# Patient Record
Sex: Female | Born: 1955
Health system: Southern US, Community
[De-identification: ages and names within clinical notes are randomized; demographics above are authoritative.]

## PROBLEM LIST (undated history)

## (undated) DIAGNOSIS — R7303 Prediabetes: Secondary | ICD-10-CM

## (undated) DIAGNOSIS — E039 Hypothyroidism, unspecified: Secondary | ICD-10-CM

## (undated) DIAGNOSIS — D039 Melanoma in situ, unspecified: Secondary | ICD-10-CM

## (undated) DIAGNOSIS — K219 Gastro-esophageal reflux disease without esophagitis: Secondary | ICD-10-CM

## (undated) DIAGNOSIS — N189 Chronic kidney disease, unspecified: Secondary | ICD-10-CM

## (undated) DIAGNOSIS — J189 Pneumonia, unspecified organism: Secondary | ICD-10-CM

## (undated) DIAGNOSIS — R0600 Dyspnea, unspecified: Secondary | ICD-10-CM

## (undated) DIAGNOSIS — F319 Bipolar disorder, unspecified: Secondary | ICD-10-CM

## (undated) DIAGNOSIS — Z859 Personal history of malignant neoplasm, unspecified: Secondary | ICD-10-CM

## (undated) DIAGNOSIS — K602 Anal fissure, unspecified: Secondary | ICD-10-CM

## (undated) DIAGNOSIS — F32A Depression, unspecified: Secondary | ICD-10-CM

## (undated) DIAGNOSIS — Z85828 Personal history of other malignant neoplasm of skin: Secondary | ICD-10-CM

## (undated) DIAGNOSIS — K589 Irritable bowel syndrome without diarrhea: Secondary | ICD-10-CM

## (undated) DIAGNOSIS — E785 Hyperlipidemia, unspecified: Secondary | ICD-10-CM

## (undated) DIAGNOSIS — M20012 Mallet finger of left finger(s): Secondary | ICD-10-CM

## (undated) DIAGNOSIS — M1990 Unspecified osteoarthritis, unspecified site: Secondary | ICD-10-CM

## (undated) DIAGNOSIS — J309 Allergic rhinitis, unspecified: Secondary | ICD-10-CM

## (undated) DIAGNOSIS — T7840XA Allergy, unspecified, initial encounter: Secondary | ICD-10-CM

## (undated) DIAGNOSIS — F419 Anxiety disorder, unspecified: Secondary | ICD-10-CM

## (undated) DIAGNOSIS — Z9889 Other specified postprocedural states: Secondary | ICD-10-CM

## (undated) DIAGNOSIS — K7689 Other specified diseases of liver: Secondary | ICD-10-CM

## (undated) HISTORY — DX: Other specified diseases of liver: K76.89

## (undated) HISTORY — DX: Depression, unspecified: F32.A

## (undated) HISTORY — PX: CARDIOVASCULAR STRESS TEST: SHX262

## (undated) HISTORY — DX: Hyperlipidemia, unspecified: E78.5

## (undated) HISTORY — DX: Bipolar disorder, unspecified: F31.9

## (undated) HISTORY — DX: Allergy, unspecified, initial encounter: T78.40XA

## (undated) HISTORY — DX: Unspecified osteoarthritis, unspecified site: M19.90

## (undated) HISTORY — PX: WISDOM TOOTH EXTRACTION: SHX21

## (undated) HISTORY — PX: ABLATION: SHX5711

## (undated) HISTORY — DX: Irritable bowel syndrome, unspecified: K58.9

## (undated) HISTORY — DX: Anxiety disorder, unspecified: F41.9

## (undated) HISTORY — DX: Gastro-esophageal reflux disease without esophagitis: K21.9

## (undated) HISTORY — DX: Anal fissure, unspecified: K60.2

---
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
## 1999-04-10 HISTORY — PX: CARDIAC CATHETERIZATION: SHX172

## 2000-03-15 ENCOUNTER — Other Ambulatory Visit: Admission: RE | Admit: 2000-03-15 | Discharge: 2000-03-15 | Payer: Self-pay | Admitting: Family Medicine

## 2000-03-18 ENCOUNTER — Encounter (INDEPENDENT_AMBULATORY_CARE_PROVIDER_SITE_OTHER): Payer: Self-pay | Admitting: *Deleted

## 2000-03-18 ENCOUNTER — Ambulatory Visit (HOSPITAL_COMMUNITY): Admission: RE | Admit: 2000-03-18 | Discharge: 2000-03-19 | Payer: Self-pay | Admitting: General Surgery

## 2000-03-18 HISTORY — PX: LAPAROSCOPIC CHOLECYSTECTOMY: SUR755

## 2001-04-09 ENCOUNTER — Other Ambulatory Visit: Admission: RE | Admit: 2001-04-09 | Discharge: 2001-04-09 | Payer: Self-pay | Admitting: Family Medicine

## 2001-12-10 HISTORY — PX: CATARACT EXTRACTION W/ INTRAOCULAR LENS  IMPLANT, BILATERAL: SHX1307

## 2002-05-21 ENCOUNTER — Other Ambulatory Visit: Admission: RE | Admit: 2002-05-21 | Discharge: 2002-05-21 | Payer: Self-pay | Admitting: Family Medicine

## 2002-11-09 HISTORY — PX: COLONOSCOPY: SHX174

## 2002-11-27 LAB — HM COLONOSCOPY: HM Colonoscopy: NORMAL

## 2003-07-09 ENCOUNTER — Other Ambulatory Visit: Admission: RE | Admit: 2003-07-09 | Discharge: 2003-07-09 | Payer: Self-pay | Admitting: Family Medicine

## 2004-07-10 ENCOUNTER — Other Ambulatory Visit: Admission: RE | Admit: 2004-07-10 | Discharge: 2004-07-10 | Payer: Self-pay | Admitting: Family Medicine

## 2004-11-10 ENCOUNTER — Ambulatory Visit: Payer: Self-pay | Admitting: Family Medicine

## 2005-02-15 ENCOUNTER — Ambulatory Visit: Payer: Self-pay | Admitting: Family Medicine

## 2005-02-20 ENCOUNTER — Ambulatory Visit: Payer: Self-pay | Admitting: Family Medicine

## 2005-04-24 ENCOUNTER — Ambulatory Visit: Payer: Self-pay | Admitting: Family Medicine

## 2005-08-06 ENCOUNTER — Other Ambulatory Visit: Admission: RE | Admit: 2005-08-06 | Discharge: 2005-08-06 | Payer: Self-pay | Admitting: Family Medicine

## 2005-08-06 ENCOUNTER — Ambulatory Visit: Payer: Self-pay | Admitting: Family Medicine

## 2005-08-29 ENCOUNTER — Ambulatory Visit: Payer: Self-pay | Admitting: Family Medicine

## 2005-10-02 ENCOUNTER — Ambulatory Visit: Payer: Self-pay | Admitting: Family Medicine

## 2005-11-06 ENCOUNTER — Ambulatory Visit: Payer: Self-pay | Admitting: Family Medicine

## 2005-11-23 ENCOUNTER — Ambulatory Visit: Payer: Self-pay | Admitting: Gastroenterology

## 2006-03-25 ENCOUNTER — Ambulatory Visit: Payer: Self-pay | Admitting: Internal Medicine

## 2006-08-15 ENCOUNTER — Ambulatory Visit: Payer: Self-pay | Admitting: Family Medicine

## 2006-08-16 ENCOUNTER — Encounter: Payer: Self-pay | Admitting: Family Medicine

## 2006-08-16 LAB — CONVERTED CEMR LAB: TSH: 1.106 microintl units/mL

## 2006-09-16 ENCOUNTER — Ambulatory Visit: Payer: Self-pay | Admitting: Family Medicine

## 2006-10-25 ENCOUNTER — Ambulatory Visit: Payer: Self-pay | Admitting: Family Medicine

## 2007-01-17 ENCOUNTER — Ambulatory Visit: Payer: Self-pay | Admitting: Family Medicine

## 2007-07-07 ENCOUNTER — Ambulatory Visit: Payer: Self-pay | Admitting: Family Medicine

## 2007-07-07 DIAGNOSIS — E7800 Pure hypercholesterolemia, unspecified: Secondary | ICD-10-CM | POA: Insufficient documentation

## 2007-08-05 ENCOUNTER — Ambulatory Visit: Payer: Self-pay | Admitting: Family Medicine

## 2007-08-05 DIAGNOSIS — K219 Gastro-esophageal reflux disease without esophagitis: Secondary | ICD-10-CM | POA: Insufficient documentation

## 2007-08-05 DIAGNOSIS — J309 Allergic rhinitis, unspecified: Secondary | ICD-10-CM | POA: Insufficient documentation

## 2007-08-05 DIAGNOSIS — E039 Hypothyroidism, unspecified: Secondary | ICD-10-CM | POA: Insufficient documentation

## 2007-08-05 DIAGNOSIS — K589 Irritable bowel syndrome without diarrhea: Secondary | ICD-10-CM | POA: Insufficient documentation

## 2007-08-05 DIAGNOSIS — F319 Bipolar disorder, unspecified: Secondary | ICD-10-CM

## 2007-08-05 DIAGNOSIS — E66812 Obesity, class 2: Secondary | ICD-10-CM | POA: Insufficient documentation

## 2007-08-12 ENCOUNTER — Encounter: Payer: Self-pay | Admitting: Family Medicine

## 2007-09-17 ENCOUNTER — Ambulatory Visit: Payer: Self-pay | Admitting: Family Medicine

## 2007-09-17 ENCOUNTER — Encounter: Payer: Self-pay | Admitting: Family Medicine

## 2007-09-17 ENCOUNTER — Other Ambulatory Visit: Admission: RE | Admit: 2007-09-17 | Discharge: 2007-09-17 | Payer: Self-pay | Admitting: Family Medicine

## 2007-09-22 ENCOUNTER — Encounter (INDEPENDENT_AMBULATORY_CARE_PROVIDER_SITE_OTHER): Payer: Self-pay | Admitting: *Deleted

## 2007-09-22 LAB — CONVERTED CEMR LAB: Pap Smear: NORMAL

## 2007-10-01 ENCOUNTER — Encounter: Payer: Self-pay | Admitting: Family Medicine

## 2007-10-08 ENCOUNTER — Encounter (INDEPENDENT_AMBULATORY_CARE_PROVIDER_SITE_OTHER): Payer: Self-pay | Admitting: *Deleted

## 2008-02-02 ENCOUNTER — Ambulatory Visit: Payer: Self-pay | Admitting: Family Medicine

## 2008-04-29 ENCOUNTER — Telehealth: Payer: Self-pay | Admitting: Family Medicine

## 2008-06-22 ENCOUNTER — Encounter: Payer: Self-pay | Admitting: Family Medicine

## 2008-07-29 ENCOUNTER — Ambulatory Visit: Payer: Self-pay | Admitting: Family Medicine

## 2008-07-29 LAB — CONVERTED CEMR LAB
Bilirubin Urine: NEGATIVE
Ketones, urine, test strip: NEGATIVE
Protein, U semiquant: NEGATIVE
Urobilinogen, UA: 0.2

## 2008-07-30 ENCOUNTER — Encounter: Payer: Self-pay | Admitting: Family Medicine

## 2008-09-14 ENCOUNTER — Ambulatory Visit: Payer: Self-pay | Admitting: Family Medicine

## 2008-09-17 ENCOUNTER — Encounter: Payer: Self-pay | Admitting: Family Medicine

## 2008-09-17 ENCOUNTER — Other Ambulatory Visit: Admission: RE | Admit: 2008-09-17 | Discharge: 2008-09-17 | Payer: Self-pay | Admitting: Family Medicine

## 2008-09-17 ENCOUNTER — Ambulatory Visit: Payer: Self-pay | Admitting: Family Medicine

## 2008-09-17 DIAGNOSIS — B351 Tinea unguium: Secondary | ICD-10-CM

## 2008-10-13 ENCOUNTER — Encounter: Payer: Self-pay | Admitting: Family Medicine

## 2008-10-14 ENCOUNTER — Ambulatory Visit: Payer: Self-pay | Admitting: Family Medicine

## 2008-10-18 ENCOUNTER — Telehealth (INDEPENDENT_AMBULATORY_CARE_PROVIDER_SITE_OTHER): Payer: Self-pay | Admitting: Internal Medicine

## 2008-10-29 ENCOUNTER — Encounter (INDEPENDENT_AMBULATORY_CARE_PROVIDER_SITE_OTHER): Payer: Self-pay | Admitting: Internal Medicine

## 2008-11-02 ENCOUNTER — Telehealth (INDEPENDENT_AMBULATORY_CARE_PROVIDER_SITE_OTHER): Payer: Self-pay | Admitting: Internal Medicine

## 2008-11-09 ENCOUNTER — Ambulatory Visit: Payer: Self-pay | Admitting: Internal Medicine

## 2008-11-10 ENCOUNTER — Encounter (INDEPENDENT_AMBULATORY_CARE_PROVIDER_SITE_OTHER): Payer: Self-pay | Admitting: Internal Medicine

## 2008-12-15 ENCOUNTER — Encounter (INDEPENDENT_AMBULATORY_CARE_PROVIDER_SITE_OTHER): Payer: Self-pay | Admitting: Internal Medicine

## 2009-03-08 ENCOUNTER — Ambulatory Visit: Payer: Self-pay | Admitting: Family Medicine

## 2009-03-09 ENCOUNTER — Encounter: Payer: Self-pay | Admitting: Family Medicine

## 2009-03-10 ENCOUNTER — Ambulatory Visit: Payer: Self-pay | Admitting: Family Medicine

## 2009-03-10 DIAGNOSIS — M5137 Other intervertebral disc degeneration, lumbosacral region: Secondary | ICD-10-CM

## 2009-03-10 DIAGNOSIS — M1610 Unilateral primary osteoarthritis, unspecified hip: Secondary | ICD-10-CM | POA: Insufficient documentation

## 2009-03-18 ENCOUNTER — Telehealth: Payer: Self-pay | Admitting: Family Medicine

## 2009-05-04 ENCOUNTER — Ambulatory Visit: Payer: Self-pay | Admitting: Family Medicine

## 2009-07-05 ENCOUNTER — Telehealth: Payer: Self-pay | Admitting: Family Medicine

## 2009-07-06 ENCOUNTER — Telehealth: Payer: Self-pay | Admitting: Family Medicine

## 2009-09-07 ENCOUNTER — Ambulatory Visit: Payer: Self-pay | Admitting: Family Medicine

## 2009-09-09 ENCOUNTER — Encounter: Payer: Self-pay | Admitting: Family Medicine

## 2009-09-30 ENCOUNTER — Ambulatory Visit: Payer: Self-pay | Admitting: Family Medicine

## 2009-09-30 ENCOUNTER — Other Ambulatory Visit: Admission: RE | Admit: 2009-09-30 | Discharge: 2009-09-30 | Payer: Self-pay | Admitting: Family Medicine

## 2009-09-30 ENCOUNTER — Encounter: Payer: Self-pay | Admitting: Family Medicine

## 2009-09-30 DIAGNOSIS — Z780 Asymptomatic menopausal state: Secondary | ICD-10-CM | POA: Insufficient documentation

## 2009-10-04 ENCOUNTER — Encounter: Payer: Self-pay | Admitting: Family Medicine

## 2009-10-05 ENCOUNTER — Encounter (INDEPENDENT_AMBULATORY_CARE_PROVIDER_SITE_OTHER): Payer: Self-pay | Admitting: *Deleted

## 2009-10-13 ENCOUNTER — Telehealth: Payer: Self-pay | Admitting: Family Medicine

## 2009-10-19 ENCOUNTER — Encounter (INDEPENDENT_AMBULATORY_CARE_PROVIDER_SITE_OTHER): Payer: Self-pay | Admitting: *Deleted

## 2009-11-28 ENCOUNTER — Ambulatory Visit: Payer: Self-pay | Admitting: Family Medicine

## 2009-11-30 ENCOUNTER — Telehealth: Payer: Self-pay | Admitting: Family Medicine

## 2009-12-01 ENCOUNTER — Ambulatory Visit: Payer: Self-pay | Admitting: Family Medicine

## 2009-12-01 DIAGNOSIS — M7750 Other enthesopathy of unspecified foot: Secondary | ICD-10-CM | POA: Insufficient documentation

## 2009-12-13 ENCOUNTER — Telehealth: Payer: Self-pay | Admitting: Family Medicine

## 2009-12-26 ENCOUNTER — Ambulatory Visit: Payer: Self-pay | Admitting: Family Medicine

## 2010-01-06 ENCOUNTER — Encounter: Payer: Self-pay | Admitting: Family Medicine

## 2010-02-09 ENCOUNTER — Telehealth: Payer: Self-pay | Admitting: Family Medicine

## 2010-04-11 ENCOUNTER — Ambulatory Visit: Payer: Self-pay | Admitting: Family Medicine

## 2010-06-01 ENCOUNTER — Telehealth: Payer: Self-pay | Admitting: Family Medicine

## 2010-07-26 ENCOUNTER — Telehealth: Payer: Self-pay | Admitting: Family Medicine

## 2010-09-08 ENCOUNTER — Ambulatory Visit: Payer: Self-pay | Admitting: Family Medicine

## 2010-09-14 ENCOUNTER — Telehealth: Payer: Self-pay | Admitting: Family Medicine

## 2010-09-15 ENCOUNTER — Encounter: Payer: Self-pay | Admitting: Family Medicine

## 2010-09-29 ENCOUNTER — Telehealth (INDEPENDENT_AMBULATORY_CARE_PROVIDER_SITE_OTHER): Payer: Self-pay | Admitting: *Deleted

## 2010-10-02 ENCOUNTER — Ambulatory Visit: Payer: Self-pay | Admitting: Family Medicine

## 2010-10-04 ENCOUNTER — Ambulatory Visit: Payer: Self-pay | Admitting: Family Medicine

## 2010-10-04 DIAGNOSIS — D485 Neoplasm of uncertain behavior of skin: Secondary | ICD-10-CM

## 2010-10-16 ENCOUNTER — Encounter: Payer: Self-pay | Admitting: Family Medicine

## 2010-10-16 DIAGNOSIS — C4491 Basal cell carcinoma of skin, unspecified: Secondary | ICD-10-CM

## 2010-10-16 HISTORY — DX: Basal cell carcinoma of skin, unspecified: C44.91

## 2010-10-20 ENCOUNTER — Encounter: Payer: Self-pay | Admitting: Family Medicine

## 2010-10-20 ENCOUNTER — Ambulatory Visit: Payer: Self-pay | Admitting: Family Medicine

## 2010-10-30 ENCOUNTER — Encounter: Payer: Self-pay | Admitting: Family Medicine

## 2011-01-09 NOTE — Progress Notes (Signed)
Summary: prior auth needed for voltaren gel  Phone Note From Pharmacy   [facility]  [address] [id number] Summary of Call: Prior Berkley Harvey is needed for voltaren gel, form is on your desk. Initial call taken by: Lowella Petties CMA,  September 14, 2010 9:08 AM

## 2011-01-09 NOTE — Progress Notes (Signed)
----   Converted from flag ---- ---- 09/28/2010 8:06 PM, Judith Part MD wrote: please check wellness, v70.0, 272 lipids - thanks  ---- 09/28/2010 9:50 AM, Liane Comber CMA (AAMA) wrote: Lab orders please! Good Morning! This pt is scheduled for cpx labs Monday, which labs to draw and dx codes to use? Thanks Tasha ------------------------------

## 2011-01-09 NOTE — Progress Notes (Signed)
Summary: Medication refill...- prevacid, synthroid  Phone Note Call from Patient   Reason for Call: Refill Medication Summary of Call: Need refill for 90 day supply [facility] to [facility].... 1. Synthroid (levothyroxine 0.50mg ) 2. Prevacid (Lansoprazole dr 30mg ) Pt call back # [phone] .Marland KitchenDaine Gip  June 01, 2010 3:31 PM Initial call taken by: Daine Gip,  June 01, 2010 3:31 PM    Prescriptions: PREVACID 30 MG  CPDR (LANSOPRAZOLE) one by mouth once daily  #90 x 3   Entered by:   Lowella Petties CMA   Authorized by:   Judith Part MD   Signed by:   Lowella Petties CMA on 06/02/2010   Method used:   Faxed to ...       [facility] Environmental education officer)       [address] [address]       [place], [place]  [address]       Ph: [phone]       Fax: [phone]   RxID:   [id number] SYNTHROID 50 MCG  TABS (LEVOTHYROXINE SODIUM) one by mouth once daily  #90 x 3   Entered by:   Lowella Petties CMA   Authorized by:   Judith Part MD   Signed by:   Lowella Petties CMA on 06/02/2010   Method used:   Faxed to ...       [facility] education [facility])       [address] [address]       [place], [place]  [address]       Ph: [phone]       Fax: [phone]   RxID:   [id number]

## 2011-01-09 NOTE — Progress Notes (Signed)
Summary: refill request for lansoprazole  Phone Note Refill Request Message from:  Fax from Pharmacy  Refills Requested: Medication #1:  PREVACID 30 MG  CPDR one by mouth once daily  Medication #2:  SYNTHROID 50 MCG  TABS one by mouth once daily [facility], these were faxed electronically, but they must have not gotten them.  Please sign faxs.   Initial call taken by: Lowella Petties CMA,  July 26, 2010 8:33 AM  Follow-up for Phone Call        forms signed in IN box  Follow-up by: Judith Part MD,  July 26, 2010 1:43 PM  Additional Follow-up for Phone Call Additional follow up Details #1::        completed forms  faxed to [phone] as instructed.Lewanda Rife LPN  July 26, 2010 4:49 PM

## 2011-01-09 NOTE — Assessment & Plan Note (Signed)
Summary: SWELLING ABOVE KNEE L LEG/CLE   Vital Signs:  Patient profile:   55 year old female Height:      60 inches Weight:      243.0 pounds BMI:     47.63 Temp:     98.8 degrees F oral Pulse rate:   76 / minute Pulse rhythm:   regular BP sitting:   108 / 70  (left arm) Cuff size:   large  Vitals Entered By: Benny Lennert CMA Duncan Dull) (September 08, 2010 11:54 AM)  History of Present Illness: Chief complaint swelling above left knee  Recnt trip to [place].Marland Kitchena lot of walking, up stairs.  Notice left knee swelling, searing pain in both legs up to B hips. PAin 7/10 on pain scale  Rested after returned, but pain remained now in left mid thigh..knee itself does not hurt.  No redness.   Has history of B  trochanteric bursitis  Elevated..no ice, using aleve 1 tab by mouth daily.  Problems Prior to Update: 1)  Metatarsalgia  (ICD-726.70) 2)  Foot Pain, Left  (ICD-729.5) 3)  Family History Osteoporosis  (ICD-V17.81) 4)  Postmenopausal Status  (ICD-V49.81) 5)  Trochanteric Bursitis, Right  (ICD-726.5) 6)  Trochanteric Bursitis, Left  (ICD-726.5) 7)  Arthritis, Right Hip  (ICD-716.95) 8)  Degenerative Disc Disease, Lumbar Spine  (ICD-722.52) 9)  Adverse Drug Reaction  (ICD-995.20) 10)  Hip Pain, Right  (ICD-719.45) 11)  Onychomycosis  (ICD-110.1) 12)  Other Screening Mammogram  (ICD-V76.12) 13)  Gynecological Examination, Routine  (ICD-V72.31) 14)  Health Maintenance Exam  (ICD-V70.0) 15)  Hx of Ibs  (ICD-564.1) 16)  Hx of Bipolar Disorder Unspecified  (ICD-296.80) 17)  Hx of Obesity  (ICD-278.00) 18)  Hypothyroidism  (ICD-244.9) 19)  Gerd  (ICD-530.81) 20)  Allergic Rhinitis  (ICD-477.9) 21)  Hypercholesterolemia, Pure  (ICD-272.0)  Current Medications (verified): 1)  Adult Aspirin Low Strength 81 Mg  Tbdp (Aspirin) .... One By Mouth Qd 2)  Zyrtec Allergy 10 Mg  Tabs (Cetirizine Hcl) .... One By Mouth Qd 3)  Synthroid 50 Mcg  Tabs (Levothyroxine Sodium) .... One By Mouth  Once Daily 4)  Abilify 30 Mg  Tabs (Aripiprazole) .... One By Mouth Qd 5)  Prevacid 30 Mg  Cpdr (Lansoprazole) .... One By Mouth Once Daily 6)  Ambien 10 Mg Tabs (Zolpidem Tartrate) .... One By Mouth Daily 7)  Lovastatin 40 Mg  Tabs (Lovastatin) .Marland Kitchen.. 1 By Mouth Once Daily 8)  Symbyax 3-25 Mg Caps (Olanzapine-Fluoxetine Hcl) .... One Tab By Mouth At Bedtime 9)  Klonopin 1 Mg Tabs (Clonazepam) .... One By Mouth At Bedtime As Needed 10)  Multivitamins  Tabs (Multiple Vitamin) .... One By Mouth Daily 11)  Fish Oil 2400 Mg .... Daily 12)  Budeprion Xl 150 Mg Xr24h-Tab (Bupropion Hcl) .... 2 Tabs By Mouth  Every Morning 13)  Calcium Citrate-Vitamin D 315-200 Mg-Unit  Tabs (Calcium Citrate-Vitamin D) .... One Twice A Day 14)  Vitamin D 2000 Unit Caps (Cholecalciferol) .... Take One By Mouth Daily 15)  Mobic 15 Mg Tabs (Meloxicam) .Marland Kitchen.. 1 By Mouth Once Daily With Food As Needed Foot Pain 16)  Voltaren 1 % Gel (Diclofenac Sodium) .... 4 G Applied Qid To Affected Area  Allergies: 1)  ! * Lithium 2)  ! Tegretol 3)  ! Lidocaine 4)  ! Xylocaine 5)  ! Codeine 6)  ! * Lamictal 7)  * Tussioinex  Past History:  Past medical, surgical, family and social histories (including risk factors) reviewed, and no  changes noted (except as noted below).  Past Medical History: Reviewed history from 09/17/2008 and no changes required. Allergic rhinitis GERD Hypothyroidism bipolar IBS obesity hyperlipidemia  psych-- Dr Delle Reining  Past Surgical History: Reviewed history from 08/05/2007 and no changes required. Heart cath- neg (04/1999) Cataract extraction (09/2002) Cholecystectomy (04/001) Colonoscopy (11/2002)  Family History: Reviewed history from 09/17/2008 and no changes required. Father: HTN, DM, chol Mother: stroke, CAD, OP Siblings: 1 brother, 3 sisters no cancer in family   Social History: Reviewed history from 09/17/2008 and no changes required. Marital Status: Married Children: 1  daughter Occupation: home non smoker no alcohol   Review of Systems General:  Denies fatigue and fever. CV:  Denies chest pain or discomfort. Resp:  Denies shortness of breath.  Physical Exam  General:  obese appearing female in NAD Mouth:  MMM Lungs:  Normal respiratory effort, chest expands symmetrically. Lungs are clear to auscultation, no crackles or wheezes. Heart:  Normal rate and regular rhythm. S1 and S2 normal without gallop, murmur, click, rub or other extra sounds. Pulses:  R and L posterior tibial pulses are full and equal bilaterally  Extremities:  no edmea    Knee Exam  Gait:    Normal heel-toe gait pattern bilaterally.    Knee Exam:    Right:    Inspection:  Normal    Palpation:  Normal    Stability:  nml    Tenderness:  none    Swelling:  none    Erythema:  none    Left:    Inspection:  Abnormal    Palpation:  Abnormal    Stability:  stable    Swelling:  left lateral    Erythema:  none    mild joint line ttp...pain mainly over insertion of iliotibilal band and up lateral thigh   Impression & Recommendations:  Problem # 1:  ILIOTIBIAL BAND SYNDROME, LEFT KNEE (ICD-728.89) Topical NSAID given past SE to meloxicam. ICe and OICe massage. Gentle stretching. info given. Follow up if not improving in 2 weeks.   Complete Medication List: 1)  Adult Aspirin Low Strength 81 Mg Tbdp (Aspirin) .... One by mouth qd 2)  Zyrtec Allergy 10 Mg Tabs (Cetirizine hcl) .... One by mouth qd 3)  Synthroid 50 Mcg Tabs (Levothyroxine sodium) .... One by mouth once daily 4)  Abilify 30 Mg Tabs (Aripiprazole) .... One by mouth qd 5)  Prevacid 30 Mg Cpdr (Lansoprazole) .... One by mouth once daily 6)  Ambien 10 Mg Tabs (Zolpidem tartrate) .... One by mouth daily 7)  Lovastatin 40 Mg Tabs (Lovastatin) .Marland Kitchen.. 1 by mouth once daily 8)  Symbyax 3-25 Mg Caps (Olanzapine-fluoxetine hcl) .... One tab by mouth at bedtime 9)  Klonopin 1 Mg Tabs (Clonazepam) .... One by mouth at  bedtime as needed 10)  Multivitamins Tabs (Multiple vitamin) .... One by mouth daily 11)  Fish Oil 2400 Mg  .... Daily 12)  Budeprion Xl 150 Mg Xr24h-tab (Bupropion hcl) .... 2 tabs by mouth  every morning 13)  Calcium Citrate-vitamin D 315-200 Mg-unit Tabs (Calcium citrate-vitamin d) .... One twice a day 14)  Vitamin D 2000 Unit Caps (Cholecalciferol) .... Take one by mouth daily 15)  Mobic 15 Mg Tabs (Meloxicam) .Marland Kitchen.. 1 by mouth once daily with food as needed foot pain 16)  Voltaren 1 % Gel (Diclofenac sodium) .... 4 g applied qid to affected area  Patient Instructions: 1)  Apply topical antinflammatory, ice 2 times daily and ice massage over area. 2)  Start gentle stretches included in info packet.  3)   Call if not improving in 2 weeks. Prescriptions: VOLTAREN 1 % GEL (DICLOFENAC SODIUM) 4 g applied QID to affected area  #1 container x 0   Entered and Authorized by:   Kerby Nora MD   Signed by:   Kerby Nora MD on 09/08/2010   Method used:   [facility]        [facility]  [address] [facility]       [address]       [place], [place]  [address]       Ph: [phone] or [phone]       Fax: [phone]   RxID:   [id number]   Current Allergies (reviewed today): ! * LITHIUM ! TEGRETOL ! LIDOCAINE ! XYLOCAINE ! CODEINE ! * LAMICTAL * TUSSIOINEX

## 2011-01-09 NOTE — Assessment & Plan Note (Signed)
Summary: CPX/CLE   Vital Signs:  Patient profile:   55 year old female Height:      60 inches Weight:      245.75 pounds BMI:     48.17 Temp:     98.4 degrees F oral Pulse rate:   80 / minute Pulse rhythm:   regular BP sitting:   112 / 76  (left arm) Cuff size:   large  Vitals Entered By: Lewanda Rife LPN (October 04, 2010 9:51 AM) CC: CPX LMP 2 yrs ago   History of Present Illness: here for health mt exam and rev of chronic med problems   wt is up 2 lb with bmi of 48  walking 30 minutes every day  is hard to loose weight with the med she is on  stamina is great   is eating less and eating healthier foods   is great with her bipolar --never been better than on current medicines   112/76 bp   nl tsh -- thyroid stable   chol with inc trig of 221 and good hdl of 60 and LDL90  colonosc 03-- f/u 10 y  pap nl in 10/10 no abn paps or gyn problems  no new partners    mam 11/09-- did not do one last year   dexa nl in 2010 ca and D-- is good about that -- with 2000 international units of D    glucose 100  Allergies: 1)  ! * Lithium 2)  ! Tegretol 3)  ! Lidocaine 4)  ! Xylocaine 5)  ! Codeine 6)  ! * Lamictal 7)  * Tussioinex  Past History:  Past Medical History: Last updated: 09/17/2008 Allergic rhinitis GERD Hypothyroidism bipolar IBS obesity hyperlipidemia  psych-- Dr Delle Reining  Past Surgical History: Last updated: 08/05/2007 Heart cath- neg (04/1999) Cataract extraction (09/2002) Cholecystectomy (04/001) Colonoscopy (11/2002)  Family History: Last updated: 09/17/2008 Father: HTN, DM, chol Mother: stroke, CAD, OP Siblings: 1 brother, 3 sisters no cancer in family   Social History: Last updated: 09/17/2008 Marital Status: Married Children: 1 daughter Occupation: home non smoker no alcohol   Risk Factors: Smoking Status: never (08/05/2007)  Review of Systems General:  Denies fatigue, loss of appetite, and malaise. Eyes:  Denies  blurring and eye irritation. CV:  Denies chest pain or discomfort, lightheadness, palpitations, and shortness of breath with exertion. Resp:  Denies cough, shortness of breath, and wheezing. GI:  Denies abdominal pain, change in bowel habits, and indigestion. GU:  Denies dysuria and urinary frequency. MS:  Denies joint pain, joint redness, joint swelling, and cramps. Derm:  Denies itching, lesion(s), poor wound healing, and rash. Neuro:  Denies numbness and tingling. Psych:  mood is fairly good on current meds . Endo:  Denies cold intolerance, excessive thirst, excessive urination, and heat intolerance. Heme:  Denies abnormal bruising and bleeding.  Physical Exam  General:  obese and well appearing  Head:  normocephalic, atraumatic, and no abnormalities observed.   Eyes:  vision grossly intact, pupils equal, pupils round, and pupils reactive to light.  no conjunctival pallor, injection or icterus  Ears:  R ear normal and L ear normal.   Nose:  no nasal discharge.   Mouth:  pharynx pink and moist.   Neck:  supple with full rom and no masses or thyromegally, no JVD or carotid bruit  Chest Wall:  No deformities, masses, or tenderness noted. Breasts:  No mass, nodules, thickening, tenderness, bulging, retraction, inflamation, nipple discharge or skin changes noted.  Lungs:  Normal respiratory effort, chest expands symmetrically. Lungs are clear to auscultation, no crackles or wheezes. Heart:  Normal rate and regular rhythm. S1 and S2 normal without gallop, murmur, click, rub or other extra sounds. Abdomen:  Bowel sounds positive,abdomen soft and non-tender without masses, organomegaly or hernias noted. no renal bruits Msk:  No deformity or scoliosis noted of thoracic or lumbar spine.   Pulses:  R and L posterior tibial pulses are full and equal bilaterally  Extremities:  no edmea  Neurologic:  sensation intact to light touch, gait normal, and DTRs symmetrical and normal.   Skin:  2-3 mm  irregular raised red nevus on R lower leg lentigos diffusely  Cervical Nodes:  No lymphadenopathy noted Axillary Nodes:  No palpable lymphadenopathy Inguinal Nodes:  No significant adenopathy Psych:  normal affect, talkative and pleasant    Impression & Recommendations:  Problem # 1:  HEALTH MAINTENANCE EXAM (ICD-V70.0) Assessment Comment Only reviewed health habits including diet, exercise and skin cancer prevention reviewed health maintenance list and family history  rev lab in detail  disc imp of wt loss   Problem # 2:  OTHER SCREENING MAMMOGRAM (ICD-V76.12) Assessment: Comment Only annual mammogram scheduled adv pt to continue regular self breast exams non remarkable breast exam today  Orders: Radiology Referral (Radiology)  Problem # 3:  HYPOTHYROIDISM (ICD-244.9) Assessment: Unchanged  no clinical changes and theraputic tsh  no change in dose  rev with pt Her updated medication list for this problem includes:    Synthroid 50 Mcg Tabs (Levothyroxine sodium) ..... One by mouth once daily  Labs Reviewed: TSH: 1.106 (08/16/2006)     Problem # 4:  HYPERCHOLESTEROLEMIA, PURE (ICD-272.0) Assessment: Unchanged trig up on diet an dmed rev low chol and low sugar diet  is at risk for hyperglycemia given wt and current med  rev lab with pt  Her updated medication list for this problem includes:    Lovastatin 40 Mg Tabs (Lovastatin) .Marland Kitchen... 1 by mouth once daily  Problem # 5:  NEOPLASM OF UNCERTAIN BEHAVIOR OF SKIN (ICD-238.2) ref to derm for irreg mole is good with sun protection Orders: Dermatology Referral (Derma)  Complete Medication List: 1)  Adult Aspirin Low Strength 81 Mg Tbdp (Aspirin) .... One by mouth qd 2)  Zyrtec Allergy 10 Mg Tabs (Cetirizine hcl) .... One by mouth daily 3)  Synthroid 50 Mcg Tabs (Levothyroxine sodium) .... One by mouth once daily 4)  Abilify 30 Mg Tabs (Aripiprazole) .... One by mouth daily 5)  Prevacid 30 Mg Cpdr (Lansoprazole) .... One  by mouth once daily 6)  Ambien 10 Mg Tabs (Zolpidem tartrate) .... One by mouth daily 7)  Lovastatin 40 Mg Tabs (Lovastatin) .Marland Kitchen.. 1 by mouth once daily 8)  Symbyax 3-25 Mg Caps (Olanzapine-fluoxetine hcl) .... One tab by mouth at bedtime 9)  Klonopin 1 Mg Tabs (Clonazepam) .... One by mouth at bedtime as needed 10)  Multivitamins Tabs (Multiple vitamin) .... One by mouth daily 11)  Fish Oil 2400 Mg  .... Daily 12)  Budeprion Xl 150 Mg Xr24h-tab (Bupropion hcl) .... 2 tabs by mouth  every morning 13)  Calcium Citrate-vitamin D 315-200 Mg-unit Tabs (Calcium citrate-vitamin d) .... One twice a day 14)  Vitamin D 2000 Unit Caps (Cholecalciferol) .... Take one by mouth daily 15)  Mobic 15 Mg Tabs (Meloxicam) .Marland Kitchen.. 1 by mouth once daily with food as needed foot pain 16)  Voltaren 1 % Gel (Diclofenac sodium) .... 4 g applied qid to affected  area as needed  Patient Instructions: 1)  we will do ref to derm at check out  2)  we will ref for mammogram at check out  3)  keep up the great exercise  4)  work on cutting calories for weight loss 5)  minimize sugar in diet as much as possible 6)  no change in thyroid dose  Prescriptions: LOVASTATIN 40 MG  TABS (LOVASTATIN) 1 by mouth once daily  #90 x 3   Entered and Authorized by:   Judith Part MD   Signed by:   Judith Part MD on 10/04/2010   Method used:   Print then Give to Patient   RxID:   [id number] PREVACID 30 MG  CPDR (LANSOPRAZOLE) one by mouth once daily  #90 x 3   Entered and Authorized by:   Judith Part MD   Signed by:   Judith Part MD on 10/04/2010   Method used:   Print then Give to Patient   RxID:   [id number] SYNTHROID 50 MCG  TABS (LEVOTHYROXINE SODIUM) one by mouth once daily  #90 x 3   Entered and Authorized by:   Judith Part MD   Signed by:   Judith Part MD on 10/04/2010   Method used:   Print then Give to Patient   RxID:   [id number]    Orders Added: 1)  Dermatology Referral  [Derma] 2)  Radiology Referral [Radiology] 3)  Est. Patient 40-64 years [[id number]]   Immunization History:  Influenza Immunization History:    [facility]   Immunization History:  Influenza Immunization History:    [facility]  [facility]  Current Allergies (reviewed today): ! * LITHIUM ! TEGRETOL ! LIDOCAINE ! XYLOCAINE ! CODEINE ! * LAMICTAL * TUSSIOINEX

## 2011-01-09 NOTE — Assessment & Plan Note (Signed)
Summary: 3-4 WK F/U/CLE   Vital Signs:  Patient profile:   55 year old female Height:      60 inches Weight:      240.8 pounds BMI:     47.20 Temp:     98.2 degrees F oral Pulse rate:   76 / minute Pulse rhythm:   regular BP sitting:   110 / 80  (left arm) Cuff size:   large  Vitals Entered By: Benny Lennert CMA Duncan Dull) (December 26, 2009 12:02 PM)  History of Present Illness: Chief complaint 3-4 wk follow up  f/u probable plantar fascia rupture, L  Took boot off on the left on Friday. she is doing a lot better, and she is much less symptomatic. Right now, she is wearing some Clark's clogs, and has found that this foot where makes her feel hurt much less.  She is still having some occasional plantar pain. It is much better and less swollen than previously.  wearing clark's clogs  REVIEW OF SYSTEMS  GEN: No systemic complaints, no fevers, chills, sweats, or other acute illnesses MSK: Detailed in the HPI GI: tolerating PO intake without difficulty Neuro: No numbness, parasthesias, or tingling associated. Otherwise the pertinent positives of the ROS are noted above.    Allergies: 1)  ! * Lithium 2)  ! Tegretol 3)  ! Lidocaine 4)  ! Xylocaine 5)  ! Codeine 6)  ! * Lamictal 7)  * Tussioinex  Past History:  Past medical, surgical, family and social histories (including risk factors) reviewed, and no changes noted (except as noted below).  Past Medical History: Reviewed history from 09/17/2008 and no changes required. Allergic rhinitis GERD Hypothyroidism bipolar IBS obesity hyperlipidemia  psych-- Dr Delle Reining  Past Surgical History: Reviewed history from 08/05/2007 and no changes required. Heart cath- neg (04/1999) Cataract extraction (09/2002) Cholecystectomy (04/001) Colonoscopy (11/2002)  Family History: Reviewed history from 09/17/2008 and no changes required. Father: HTN, DM, chol Mother: stroke, CAD, OP Siblings: 1 brother, 3 sisters no cancer in  family   Social History: Reviewed history from 09/17/2008 and no changes required. Marital Status: Married Children: 1 daughter Occupation: home non smoker no alcohol   Physical Exam  General:  obese and well appearingwell-developed and well-hydrated.   Head:  normocephalic, atraumatic, and no abnormalities observed.   Ears:  no external deformities.   Nose:  no external deformity.   Lungs:  normal respiratory effort.   Msk:  right foot: Nontender throughout all phalanges, nontender throughout all metatarsals, nontender at the navicular, cuboid. Stable anterior drawer test. Stable subtalar tilt test. Strength testing is preserved. Nontender at the Achilles tendon and at the plantar fascia, and grossly normal exam overall.  Left foot: Nontender along the tibia and fibula. Nontender along medial and lateral malleoli. Nontender details. Nontender at the navicular, cuneiforms, or cuboid bones. Nontender along the fifth metatarsal. Nontender at the posterior tibialis tendon and the peroneal tendons.  NT bony tenderness  Nontender at the Achilles and retrocalcaneal area.  Nontender the calcaneus, however in the midportion of the foot on the plantar aspect, and palpation of the medial aspect of the plantar fascia, there is mild amount of pain focally.   Impression & Recommendations:  Problem # 1:  FOOT PAIN, LEFT (ICD-729.5) Assessment Improved Improved, i believe partial plantar fascia rupture with now PF, improving  Please see the patient instructions for a detailed list of plans and what was discussed with the patient.   AAOSM handout, rehab reviewed  In  this case, avoid stretching with probable partial rupture  Complete Medication List: 1)  Adult Aspirin Low Strength 81 Mg Tbdp (Aspirin) .... One by mouth qd 2)  Zyrtec Allergy 10 Mg Tabs (Cetirizine hcl) .... One by mouth qd 3)  Synthroid 50 Mcg Tabs (Levothyroxine sodium) .... One by mouth once daily 4)  Abilify 30 Mg Tabs  (Aripiprazole) .... One by mouth qd 5)  Prevacid 30 Mg Cpdr (Lansoprazole) .... One by mouth once daily 6)  Ambien 10 Mg Tabs (Zolpidem tartrate) .... One by mouth daily 7)  Lovastatin 40 Mg Tabs (Lovastatin) .Marland Kitchen.. 1 by mouth once daily 8)  Symbyax 3-25 Mg Caps (Olanzapine-fluoxetine hcl) .... One tab by mouth at bedtime 9)  Klonopin 1 Mg Tabs (Clonazepam) .... One by mouth at bedtime as needed 10)  Multivitamins Tabs (Multiple vitamin) .... One by mouth daily 11)  Fish Oil 2400 Mg  .... Daily 12)  Budeprion Xl 150 Mg Xr24h-tab (Bupropion hcl) .... 2 tabs by mouth  every morning 13)  Calcium Citrate-vitamin D 315-200 Mg-unit Tabs (Calcium citrate-vitamin d) .... One twice a day 14)  Vitamin D 2000 Unit Caps (Cholecalciferol) .... Take one by mouth daily 15)  Mobic 15 Mg Tabs (Meloxicam) .Marland Kitchen.. 1 by mouth once daily with food as needed foot pain  Patient Instructions: 1)  Please read handouts from AAOSM and [employer] on Plantar Fascitis. 2)  STRETCHING and Strengthening program critically important. 3)  Strengthening on foot and calf muscles as seen in handout. 4)  Calf raises, 2 legged, then 1 legged. 5)  Foot massage with tennis ball. 6)  Ice massage. 7)  NEEDS TO BE DONE EVERY DAY 8)  Recommended over the counter insoles. (Spenco or Hapad) 9)  A rigid shoe with good arch support helps: Dansko (great), Randel Pigg, Merrell 10)  No easily bendable shoes.    Current Allergies (reviewed today): ! * LITHIUM ! TEGRETOL ! LIDOCAINE ! XYLOCAINE ! CODEINE ! * LAMICTAL * TUSSIOINEX

## 2011-01-09 NOTE — Consult Note (Signed)
Summary: [facility]  [facility]   Imported By: Lester Savannah 11/01/2010 12:45:46  _____________________________________________________________________  External Attachment:    Type:   Image     Comment:   External Document

## 2011-01-09 NOTE — Progress Notes (Signed)
Summary: regarding labs  Phone Note Call from Patient Call back at Home Phone [phone]   Caller: mPatient Call For: Judith Part MD Summary of Call: Please review lab report from 1/28.  Pt states Dr Duard Brady was concerned about pt's kidney enzymes. Initial call taken by: Lowella Petties CMA,  February 09, 2010 11:13 AM  Follow-up for Phone Call        by our reference range- cr is not abnormal -- I usually consider nl if below 1.2 for cr  ask her to inc her water intake lets keep an eye on it  sched lab in 2 mo for bun /cr for 995.2   Additional Follow-up for Phone Call Additional follow up Details #1::        Patient notified as instructed by telephone. lab appointment scheduled as instructed 04/11/10 at 8:55am.[facility] LPN  February 09, 2201 3:00 PM

## 2011-01-09 NOTE — Letter (Signed)
Summary: Results Follow up Letter  [place] [facility]  [address] [place], [place] [address]   Phone: [phone]  Fax: [phone]    10/30/2010 MRN: [id number]    [facility] [address] [place], [place]  [address]    Dear Ms. Mindel,  The following are the results of your recent test(s):  Test         Result    Pap Smear:        Normal _____  Not Normal _____ Comments: ______________________________________________________ Cholesterol: LDL(Bad cholesterol):         Your goal is less than:         HDL (Good cholesterol):       Your goal is more than: Comments:  ______________________________________________________ Mammogram:        Normal __X___  Not Normal _____ Comments:Repeat in one year.   ___________________________________________________________________ Hemoccult:        Normal _____  Not normal _______ Comments:    _____________________________________________________________________ Other Tests:    We routinely do not discuss normal results over the telephone.  If you desire a copy of the results, or you have any questions about this information we can discuss them at your next office visit.   Sincerely,    Idamae Schuller [place],MD  MT/ri

## 2011-01-09 NOTE — Progress Notes (Signed)
Summary: LOVASTATIN   Phone Note Refill Request Message from:  Patient on December 13, 2009 4:36 PM  Refills Requested: Medication #1:  LOVASTATIN 40 MG  TABS 1 by mouth once daily pt would like rx sent to [facility], she states she doesn't have time to send it by mail.   Method Requested: Fax to Mail Away Pharmacy Initial call taken by: Mervin Hack CMA Duncan Dull),  December 13, 2009 4:37 PM  Follow-up for Phone Call        px printed for fax  Follow-up by: Judith Part MD,  December 13, 2009 5:32 PM    Prescriptions: LOVASTATIN 40 MG  TABS (LOVASTATIN) 1 by mouth once daily  #90 x 3   Entered by:   Delilah Shan CMA (AAMA)   Authorized by:   Judith Part MD   Signed by:   Delilah Shan CMA (AAMA) on 12/14/2009   Method used:   Faxed to ...       [facility] education [facility])       [address] [address]       [place], [place]  [address]       Ph: [phone]       Fax: [phone]   RxID:   [id number] LOVASTATIN 40 MG  TABS (LOVASTATIN) 1 by mouth once daily  #90 x 3   Entered and Authorized by:   Judith Part MD   Signed by:   Judith Part MD on 12/13/2009   Method used:   Printed then faxed to ...       [facility]  [place] (retail)       [address]       [place], [place]  [address]       Ph: [phone] or [phone]       Fax: [phone]   RxID:   [id number]

## 2011-01-09 NOTE — Medication Information (Signed)
Summary: Prior Authorization & Approval for [facility]  Prior Authorization & Approval for [facility]   Imported By: Lanelle Bal 09/26/2010 10:26:46  _____________________________________________________________________  External Attachment:    Type:   Image     Comment:   External Document

## 2011-01-09 NOTE — Assessment & Plan Note (Signed)
Summary: CPX WITH PEP SMEAR / LFW   Vital Signs:  Patient Profile:   55 Years Old Female Height:     60.5 inches Weight:      237 pounds Temp:     97.7 degrees F oral Pulse rate:   72 / minute Pulse rhythm:   regular BP sitting:   122 / 80  (left arm) Cuff size:   large                 Chief Complaint:  30 minute check up.  History of Present Illness: has been doing ok rash is back under breast  wants flu shot needs refils on meds- for mail in   allegies have been flaring up- eyes water and nose runs flonase helps on regular base is taking the otc zyrtec- did get from mail in as well  had labs in late summer- glucose, thyroid ok, chol in good control  has been too depressed to exercise- bad family situation with step son- worries her  menses is still regular but lighter and lighter no probs with OC a few hot flashes   Current Allergies: ! * LITHIUM ! TEGRETOL ! LIDOCAINE ! XYLOCAINE ! CODEINE * TUSSIOINEX   Family History:    Father: HTN, DM, chol    Mother: stroke, CAD    Siblings: 1 brother, 3 sisters    Review of Systems      See HPI  General      Denies chills, fatigue, fever, loss of appetite, and weight loss.  Eyes      Complains of eye irritation and itching.  ENT      Complains of postnasal drainage.  CV      Denies chest pain or discomfort.  Resp      Denies cough and shortness of breath.  GI      Complains of hemorrhoids.      Denies bloody stools and change in bowel habits.      saw Dr Margaree Mackintosh, hem not bad enough to operate on at this time  GU      Denies discharge, dysuria, and urinary frequency.  Derm      Complains of rash.      Denies lesion(s).      some briusing under toe nails  Psych      Complains of depression.      sees psychiatry  Endo      Denies excessive thirst and excessive urination.  Allergy      Complains of seasonal allergies and sneezing.   Physical Exam  General:     obese but well  appearing Head:     Normocephalic and atraumatic without obvious abnormalities. No apparent alopecia or balding.- no sinus tenderness Eyes:     vision grossly intact, pupils equal, pupils round, pupils reactive to light, and no injection.   Ears:     R ear normal and L ear normal.   Nose:     nasal dischargemucosal pallor and mucosal edema.   Mouth:     pharynx pink and moist.   Neck:     No deformities, masses, or tenderness noted.supple, full ROM, no thyromegaly, no JVD, and no carotid bruits.   Breasts:     No mass, nodules, thickening, tenderness, bulging, retraction, inflamation, nipple discharge or skin changes noted.   Lungs:     Normal respiratory effort, chest expands symmetrically. Lungs are clear to auscultation, no crackles or wheezes. Heart:  Normal rate and regular rhythm. S1 and S2 normal without gallop, murmur, click, rub or other extra sounds. Abdomen:     Bowel sounds positive,abdomen soft and non-tender without masses, organomegaly or hernias noted. Rectal:     No external abnormalities noted. Normal sphincter tone. No rectal masses or tenderness.- heme neg stool Genitalia:     Normal introitus for age, no external lesions, no vaginal discharge, mucosa pink and moist, no vaginal or cervical lesions, no vaginal atrophy, no friaility or hemorrhage, normal uterus size and position, no adnexal masses or tenderness Msk:     no kyphosis or scoliosis no acute joint changes Pulses:     R and L carotid,radial,femoral,dorsalis pedis and posterior tibial pulses are full and equal bilaterally Extremities:     trace left pedal edema and trace right pedal edema.   Neurologic:     sensation intact to light touch, gait normal, and DTRs symmetrical and normal.   Skin:     patchy rash with satellite lesions under L breast  bilat 1st toenails have dark discoloration- distal 1/2- app to be growing out, somewhat thickenedturgor normal and color normal.   Cervical Nodes:     No  lymphadenopathy noted Axillary Nodes:     No palpable lymphadenopathy Inguinal Nodes:     No significant adenopathy Psych:     cheerful affect despite depression    Impression & Recommendations:  Problem # 1:  HEALTH MAINTENANCE EXAM (ICD-V70.0) disc health habits incl diet/exercise and skin ca prev urged pt to work on wt loss for better health reviewed labs, prev list and fam hx sched screen mam Orders: Mammogram (Screening) (Mammo)   Problem # 2:  GYNECOLOGICAL EXAMINATION, ROUTINE (ICD-V72.31) exam done, pend pap report  likely close to menup- disc s/s- will call if periods stop refil OC for a year- but will likely stop it next year Orders: Pap Smear ([address]) Mammogram (Screening) (Mammo)   Problem # 3:  DISORDER, SKIN NEC (ICD-709.8) fungal dermatitis under L breast advised to re start nystatin if no imp- will call or f/u  Problem # 4:  ALLERGIC RHINITIS (ICD-477.9) refil allegry meds f/u if sinus pain or fever develop Her updated medication list for this problem includes:    Zyrtec Allergy 10 Mg Tabs (Cetirizine hcl) ..... One by mouth qd    Flonase 50 Mcg/act Susp (Fluticasone propionate) .Marland Kitchen... 2 sprays in each nostril qd    Flonase 50 Mcg/act Susp (Fluticasone propionate) .Marland Kitchen... 2 sprays in each nostril qd   Problem # 5:  HYPOTHYROIDISM (ICD-244.9) no clinical change, stable tsh refilled med Her updated medication list for this problem includes:    Synthroid 50 Mcg Tabs (Levothyroxine sodium) ..... One by mouth qd   Problem # 6:  HYPERCHOLESTEROLEMIA, PURE (ICD-272.0) will continue lovastatin and diet Her updated medication list for this problem includes:    Lovastatin 40 Mg Tabs (Lovastatin) .Marland Kitchen... 1 by mouth qd   Complete Medication List: 1)  Adult Aspirin Low Strength 81 Mg Tbdp (Aspirin) .... One by mouth qd 2)  Zyrtec Allergy 10 Mg Tabs (Cetirizine hcl) .... One by mouth qd 3)  Synthroid 50 Mcg Tabs (Levothyroxine sodium) .... One by mouth qd 4)   Abilify 30 Mg Tabs (Aripiprazole) .... One by mouth qd 5)  Zoloft 100 Mg Tabs (Sertraline hcl) .... 2 by mouth qd 6)  Prevacid 30 Mg Cpdr (Lansoprazole) .... One by mouth qd 7)  Ambien Cr 12.5 Mg Tbcr (Zolpidem tartrate) .... One by mouth q  hs 8)  Fish Oil 1200 Mg Caps (Omega-3 fatty acids) .... One by mouth bid 9)  Lovastatin 40 Mg Tabs (Lovastatin) .Marland Kitchen.. 1 by mouth qd 10)  Triliptal 300 Mg  .... One by mouth qd 11)  Lo Ovral  .... Take by mouth as directed 12)  Flonase 50 Mcg/act Susp (Fluticasone propionate) .... 2 sprays in each nostril qd 13)  Flonase 50 Mcg/act Susp (Fluticasone propionate) .... 2 sprays in each nostril qd   Patient Instructions: 1)  try your best to start exercising 2)  eat healthy low fat diet 3)  keep working on weight loss 4)  we will schedule mamm at check out 5)  use nystatin cream for rash under breast- call if it does not clear    Prescriptions: ZYRTEC ALLERGY 10 MG  TABS (CETIRIZINE HCL) one by mouth qd  #90 x 3   Entered and Authorized by:   Judith Part MD   Signed by:   Judith Part MD on 09/17/2007   Method used:   Print then Give to Patient   RxID:   [id number] LO OVRAL take by mouth as directed  #3 packs x 3   Entered and Authorized by:   Judith Part MD   Signed by:   Judith Part MD on 09/17/2007   Method used:   Print then Give to Patient   RxID:   [id number] FLONASE 50 MCG/ACT  SUSP (FLUTICASONE PROPIONATE) 2 sprays in each nostril qd  #3 month x 3   Entered and Authorized by:   Judith Part MD   Signed by:   Judith Part MD on 09/17/2007   Method used:   Print then Give to Patient   RxID:   [id number] LOVASTATIN 40 MG  TABS (LOVASTATIN) 1 by mouth qd  #90 x 3   Entered and Authorized by:   Judith Part MD   Signed by:   Judith Part MD on 09/17/2007   Method used:   Print then Give to Patient   RxID:   [id number] PREVACID 30 MG  CPDR (LANSOPRAZOLE) one by mouth qd  #90 x 3   Entered  and Authorized by:   Judith Part MD   Signed by:   Judith Part MD on 09/17/2007   Method used:   Print then Give to Patient   RxID:   [id number] SYNTHROID 50 MCG  TABS (LEVOTHYROXINE SODIUM) one by mouth qd  #90 x 3   Entered and Authorized by:   Judith Part MD   Signed by:   Judith Part MD on 09/17/2007   Method used:   Print then Give to Patient   RxID:   [id number]  ]

## 2011-01-29 ENCOUNTER — Encounter: Payer: Self-pay | Admitting: Family Medicine

## 2011-01-29 ENCOUNTER — Ambulatory Visit (INDEPENDENT_AMBULATORY_CARE_PROVIDER_SITE_OTHER): Payer: 59 | Admitting: Family Medicine

## 2011-01-29 DIAGNOSIS — R079 Chest pain, unspecified: Secondary | ICD-10-CM | POA: Insufficient documentation

## 2011-01-29 DIAGNOSIS — D485 Neoplasm of uncertain behavior of skin: Secondary | ICD-10-CM

## 2011-01-29 DIAGNOSIS — K219 Gastro-esophageal reflux disease without esophagitis: Secondary | ICD-10-CM

## 2011-02-03 ENCOUNTER — Inpatient Hospital Stay (INDEPENDENT_AMBULATORY_CARE_PROVIDER_SITE_OTHER)
Admission: RE | Admit: 2011-02-03 | Discharge: 2011-02-03 | Disposition: A | Discharge status: Home / Self Care | Payer: 59 | Source: Ambulatory Visit | Attending: Family Medicine | Admitting: Family Medicine

## 2011-02-03 DIAGNOSIS — J019 Acute sinusitis, unspecified: Secondary | ICD-10-CM

## 2011-02-03 DIAGNOSIS — H1030 Unspecified acute conjunctivitis, unspecified eye: Secondary | ICD-10-CM

## 2011-02-05 ENCOUNTER — Encounter: Payer: Self-pay | Admitting: Family Medicine

## 2011-02-05 ENCOUNTER — Ambulatory Visit (INDEPENDENT_AMBULATORY_CARE_PROVIDER_SITE_OTHER): Payer: 59 | Admitting: Family Medicine

## 2011-02-05 DIAGNOSIS — J019 Acute sinusitis, unspecified: Secondary | ICD-10-CM

## 2011-02-06 NOTE — Assessment & Plan Note (Signed)
Summary: ? CHEST PAIN- WANTS EKG   Vital Signs:  Patient profile:   55 year old female Weight:      246.75 pounds BMI:     48.36 Temp:     98.4 degrees F oral Pulse rate:   68 / minute Pulse rhythm:   regular BP sitting:   122 / 78  (left arm) Cuff size:   large  Vitals Entered By: Sydell Axon LPN (January 29, 2011 1:01 PM) CC: Chest pain off and on for 6 days   History of Present Illness: has started having some sharp pains in R side of chest with some radiation to neck (bilateral)  it lasted on and off 3 hours  not exertional - watching TV and talking to someone  not really stress  no sob/ nausea or sweating   next day hurt again -- not constant - went away quickly  then "zapping" in nature -- like a flicker  no palpitatoins   this continues to go on for 6 days  got back from tx -- very stressful -- both parents ended up in hospital  father is really sick - heart problems  mother had diarrhea and weakness - she is still in hospital - ? what is wrong / had some sort of infection   she is very worried about them   mother had CAD and cva  EKG shows bradycardia rate 59 with nonspecific T wave abn -- inv in V1 thru V3  this does not appear to be new in comparison to old EKG  bmi 48  122/78- good bp   some heartburn - tums helped  takes prevacid   she had cardiac cath in 2000 - for chest pain -- and it turned out to be gallbladder instead   is coming down with uri -- some hoarseness now  some cough       Allergies: 1)  ! * Lithium 2)  ! Tegretol 3)  ! Lidocaine 4)  ! Xylocaine 5)  ! Codeine 6)  ! * Lamictal 7)  * Tussioinex  Past History:  Past Surgical History: Last updated: 08/05/2007 Heart cath- neg (04/1999) Cataract extraction (09/2002) Cholecystectomy (04/001) Colonoscopy (11/2002)  Family History: Last updated: 09/17/2008 Father: HTN, DM, chol Mother: stroke, CAD, OP Siblings: 1 brother, 3 sisters no cancer in family   Social  History: Last updated: 09/17/2008 Marital Status: Married Children: 1 daughter Occupation: home non smoker no alcohol   Risk Factors: Smoking Status: never (08/05/2007)  Past Medical History: Allergic rhinitis GERD Hypothyroidism bipolar IBS obesity hyperlipidemia basal cell carcinoma  squamous cell carcinoma   psych-- Dr Delle Reining derm- Dr Willeen Niece   Review of Systems General:  Complains of fatigue; denies chills, fever, loss of appetite, and malaise. Eyes:  Denies blurring, discharge, and eye irritation. ENT:  Complains of hoarseness, nasal congestion, postnasal drainage, and sore throat. CV:  Complains of chest pain or discomfort; denies palpitations, shortness of breath with exertion, and swelling of feet. Resp:  Denies cough, shortness of breath, and wheezing. GI:  Complains of indigestion; denies abdominal pain, change in bowel habits, nausea, and vomiting. GU:  Denies dysuria and urinary frequency. MS:  Denies joint pain, cramps, and muscle weakness. Derm:  Denies rash; some new moles - forehead and back . Neuro:  Denies headaches, numbness, and tingling. Psych:  Complains of anxiety; denies suicidal thoughts/plans; very very stressed . Endo:  Denies cold intolerance, excessive thirst, excessive urination, and heat intolerance. Heme:  Denies abnormal  bruising and bleeding.  Physical Exam  General:  obese and well appearing  Head:  normocephalic, atraumatic, and no abnormalities observed.   Eyes:  vision grossly intact, pupils equal, pupils round, and pupils reactive to light.  no conjunctival pallor, injection or icterus  Mouth:  pharynx pink and moist.   Neck:  supple with full rom and no masses or thyromegally, no JVD or carotid bruit  Chest Wall:  No deformities, masses, or tenderness noted. Lungs:  Normal respiratory effort, chest expands symmetrically. Lungs are clear to auscultation, no crackles or wheezes. Heart:  Normal rate and regular rhythm. S1 and  S2 normal without gallop, murmur, click, rub or other extra sounds. Abdomen:  Bowel sounds positive,abdomen soft and non-tender without masses, organomegaly or hernias noted. no renal bruits Msk:  No deformity or scoliosis noted of thoracic or lumbar spine.   Pulses:  R and L carotid,radial,femoral,dorsalis pedis and posterior tibial pulses are full and equal bilaterally Extremities:  no edema  no palp cords or tenderness  Neurologic:  sensation intact to light touch, gait normal, and DTRs symmetrical and normal.   Skin:  lesion on forehead 2-3 mm translucent and smooth Cervical Nodes:  No lymphadenopathy noted Psych:  generally stressed but pleasant   Impression & Recommendations:  Problem # 1:  CHEST PAIN (ICD-786.50) Assessment New this is atypical and non exertional  some mild cw tenderness some gerd complaints but also some fam hx of coronary artery disease and other risk factors ref to cardiol  EKG - some T wave changes nonspec Orders: Cardiology Referral (Cardiology) EKG w/ Interpretation ([address])  Problem # 2:  GERD (ICD-530.81) disc relation to above  disc eating habits/ need for wt loss and use of PPI regularly-- will inc to two times a day short term to see if this helps update if not imp  Her updated medication list for this problem includes:    Prevacid 30 Mg Cpdr (Lansoprazole) ..... One by mouth once daily  Problem # 3:  NEOPLASM OF UNCERTAIN BEHAVIOR OF SKIN (ICD-238.2) Assessment: Deteriorated L forehead 3 mm tranlucent  cannot r/o basal cell ref to derm  Orders: Dermatology Referral (Derma)  Complete Medication List: 1)  Adult Aspirin Low Strength 81 Mg Tbdp (Aspirin) .... One by mouth qd 2)  Zyrtec Allergy 10 Mg Tabs (Cetirizine hcl) .... One by mouth daily 3)  Synthroid 50 Mcg Tabs (Levothyroxine sodium) .... One by mouth once daily 4)  Abilify 30 Mg Tabs (Aripiprazole) .... One by mouth daily 5)  Prevacid 30 Mg Cpdr (Lansoprazole) .... One by mouth  once daily 6)  Ambien 10 Mg Tabs (Zolpidem tartrate) .... One by mouth daily 7)  Lovastatin 40 Mg Tabs (Lovastatin) .Marland Kitchen.. 1 by mouth once daily 8)  Symbyax 3-25 Mg Caps (Olanzapine-fluoxetine hcl) .... One tab by mouth at bedtime 9)  Klonopin 1 Mg Tabs (Clonazepam) .... One by mouth at bedtime as needed 10)  Multivitamins Tabs (Multiple vitamin) .... One by mouth daily 11)  Fish Oil 2400 Mg  .... Daily 12)  Budeprion Xl 150 Mg Xr24h-tab (Bupropion hcl) .... 2 tabs by mouth  every morning 13)  Calcium Citrate-vitamin D 315-200 Mg-unit Tabs (Calcium citrate-vitamin d) .... One twice a day 14)  Vitamin D 2000 Unit Caps (Cholecalciferol) .... Take one by mouth daily 15)  Voltaren 1 % Gel (Diclofenac sodium) .... 4 g applied qid to affected area as needed  Patient Instructions: 1)  we will do dermatology referral at check out  2)  we will do cardiology referral at check out  3)  increase your prevacid to two times a day for 1 week to see if it helps  4)  lets see how this upper respiratory infection develops  5)  heat to the chest sometimes helps soreness  6)  if chest pain worsens or persists or shortness of breath / sweating/ nausea- go to ER    Orders Added: 1)  Dermatology Referral [Derma] 2)  Cardiology Referral [Cardiology] 3)  EKG w/ Interpretation [[address]] 4)  Est. Patient Level IV [[address]]    Current Allergies (reviewed today): ! * LITHIUM ! TEGRETOL ! LIDOCAINE ! XYLOCAINE ! CODEINE ! * LAMICTAL * TUSSIOINEX   EKG  Procedure date:  01/29/2011  Findings:      nsr with rate of 59 (slt bradycardia) some nonspecific T wave changes V1-B3 (inversions) that are now new from prior EKG

## 2011-02-15 NOTE — Assessment & Plan Note (Signed)
Summary: eye infection/alc   Vital Signs:  Patient profile:   55 year old female Weight:      246.50 pounds Temp:     99.6 degrees F oral Pulse rate:   72 / minute Pulse rhythm:   regular BP sitting:   126 / 78  (left arm) Cuff size:   large  Vitals Entered By: Selena Batten Dance CMA Duncan Dull) (February 05, 2011 2:23 PM) CC: UCC follow up (check eyes and sinus infection)  Vision Screening:Left eye w/o correction: 20 / 30 Right Eye w/o correction: 20 / 30 Both eyes w/o correction:  20/ 25  Color vision testing: normal      Vision Entered By: Selena Batten Dance CMA Duncan Dull) (February 05, 2011 2:27 PM)   History of Present Illness: CC: UCC f/u for sinusitis and eye infection?  Seen Sat at [facility] with bad cold and L eye discharge.  treated with bactrim and clioxan, using in both eyes.  Now eye infection seems to be affecting R eye.  Able to move eyes without pain.  Vision still ok.  Tolerating bactrim ok, causing some gas.  Still sinus pressure maxillary.  No RN, not really blowing anything out.  + coughing phlegm, green.  Initially HA, now better.    No ST, abd pain, n/v/d, myalgias, arthralgias.  No ear pain or tooth pain.  + elevated temp to 99.    Still coughing all night long, unable to sleep.  Has tried honey, tea, recliner, nothing helps.  Tried Tussin DM.  wants something for cough.  + husband sick as well.  No smokers at home.  No h/o asthma.  reviewed UCC record - dx sinusitis, eye conjunctivitis  Current Medications (verified): 1)  Adult Aspirin Low Strength 81 Mg  Tbdp (Aspirin) .... One By Mouth Qd 2)  Zyrtec Allergy 10 Mg  Tabs (Cetirizine Hcl) .... One By Mouth Daily 3)  Synthroid 50 Mcg  Tabs (Levothyroxine Sodium) .... One By Mouth Once Daily 4)  Abilify 30 Mg  Tabs (Aripiprazole) .... One By Mouth Daily 5)  Prevacid 30 Mg  Cpdr (Lansoprazole) .... One By Mouth Once Daily 6)  Ambien 10 Mg Tabs (Zolpidem Tartrate) .... One By Mouth Daily 7)  Lovastatin 40 Mg  Tabs (Lovastatin) .Marland Kitchen..  1 By Mouth Once Daily 8)  Symbyax 3-25 Mg Caps (Olanzapine-Fluoxetine Hcl) .... One Tab By Mouth At Bedtime 9)  Klonopin 1 Mg Tabs (Clonazepam) .... One By Mouth At Bedtime As Needed 10)  Multivitamins  Tabs (Multiple Vitamin) .... One By Mouth Daily 11)  Fish Oil 2400 Mg .... Daily 12)  Budeprion Xl 150 Mg Xr24h-Tab (Bupropion Hcl) .... 2 Tabs By Mouth  Every Morning 13)  Calcium Citrate-Vitamin D 315-200 Mg-Unit  Tabs (Calcium Citrate-Vitamin D) .... One Twice A Day 14)  Vitamin D 2000 Unit Caps (Cholecalciferol) .... Take One By Mouth Daily 15)  Voltaren 1 % Gel (Diclofenac Sodium) .... 4 G Applied Qid To Affected Area As Needed 16)  Bactrim Ds 800-160 Mg Tabs (Sulfamethoxazole-Trimethoprim) .Marland Kitchen.. 1 By Mouth Two Times A Day X14 Days 17)  Ciloxan 0.3 % Soln (Ciprofloxacin Hcl) .... Apply 1-2 Drops To Affected Eye Every 6 Hours X5 Days 18)  Hydromet 5-1.5 Mg/74ml Syrp (Hydrocodone-Homatropine) .... One Teaspoon At Bedtime As Needed Cough, Sedation Precautions  Allergies: 1)  ! * Lithium 2)  ! Tegretol 3)  ! Lidocaine 4)  ! Xylocaine 5)  ! Codeine 6)  ! * Lamictal 7)  * Tussioinex  Past History:  Past Medical History: Last updated: 01/29/2011 Allergic rhinitis GERD Hypothyroidism bipolar IBS obesity hyperlipidemia basal cell carcinoma  squamous cell carcinoma   psych-- Dr Delle Reining derm- Dr Willeen Niece   Social History: Last updated: 09/17/2008 Marital Status: Married Children: 1 daughter Occupation: home non smoker no alcohol   Review of Systems       per HPI  Physical Exam  General:  obese and well appearing , some congested Head:  normocephalic, atraumatic, and no abnormalities observed.  + maxillary >frontal sinus tenderness Eyes:  PERRLA, EOMI wihtout pain, no significant conjunctival injection, no swelling Ears:  TMS clear, + cerumen bilaterally Nose:  congested bilaterally Mouth:  pharynx pink and moist.  no exudate Neck:  supple with full rom and no  masses or thyromegally, no JVD or carotid bruit , no LAD Lungs:  Normal respiratory effort, chest expands symmetrically. Lungs are clear to auscultation, no crackles or wheezes. Heart:  Normal rate and regular rhythm. S1 and S2 normal without gallop, murmur, click, rub or other extra sounds. Pulses:  2+ rad pulses, brisk cap refill   Impression & Recommendations:  Problem # 1:  SINUSITIS, ACUTE (ICD-461.9) Assessment New with conjunctivitis (?viral).  needs more time for oral abx as well as eye drops.  red flags to return discussed.  supportive care as per instructions, hydromet for cough at night.  nasal saline irrigation.  Her updated medication list for this problem includes:    Bactrim Ds 800-160 Mg Tabs (Sulfamethoxazole-trimethoprim) .Marland Kitchen... 1 by mouth two times a day x14 days    Hydromet 5-1.5 Mg/56ml Syrp (Hydrocodone-homatropine) ..... One teaspoon at bedtime as needed cough, sedation precautions  Complete Medication List: 1)  Adult Aspirin Low Strength 81 Mg Tbdp (Aspirin) .... One by mouth qd 2)  Zyrtec Allergy 10 Mg Tabs (Cetirizine hcl) .... One by mouth daily 3)  Synthroid 50 Mcg Tabs (Levothyroxine sodium) .... One by mouth once daily 4)  Abilify 30 Mg Tabs (Aripiprazole) .... One by mouth daily 5)  Prevacid 30 Mg Cpdr (Lansoprazole) .... One by mouth once daily 6)  Ambien 10 Mg Tabs (Zolpidem tartrate) .... One by mouth daily 7)  Lovastatin 40 Mg Tabs (Lovastatin) .Marland Kitchen.. 1 by mouth once daily 8)  Symbyax 3-25 Mg Caps (Olanzapine-fluoxetine hcl) .... One tab by mouth at bedtime 9)  Klonopin 1 Mg Tabs (Clonazepam) .... One by mouth at bedtime as needed 10)  Multivitamins Tabs (Multiple vitamin) .... One by mouth daily 11)  Fish Oil 2400 Mg  .... Daily 12)  Budeprion Xl 150 Mg Xr24h-tab (Bupropion hcl) .... 2 tabs by mouth  every morning 13)  Calcium Citrate-vitamin D 315-200 Mg-unit Tabs (Calcium citrate-vitamin d) .... One twice a day 14)  Vitamin D 2000 Unit Caps  (Cholecalciferol) .... Take one by mouth daily 15)  Voltaren 1 % Gel (Diclofenac sodium) .... 4 g applied qid to affected area as needed 16)  Bactrim Ds 800-160 Mg Tabs (Sulfamethoxazole-trimethoprim) .Marland Kitchen.. 1 by mouth two times a day x14 days 17)  Ciloxan 0.3 % Soln (Ciprofloxacin hcl) .... Apply 1-2 drops to affected eye every 6 hours x5 days 18)  Hydromet 5-1.5 Mg/81ml Syrp (Hydrocodone-homatropine) .... One teaspoon at bedtime as needed cough, sedation precautions  Patient Instructions: 1)  Continue eye drops and antibiotic. 2)  Start hydrocodone cough syrup at night. 3)  start nasal saline irrigation. 4)  continue tussin dm (guaifenesin to break up mucous) with plenty of fluid 5)  I think we need more time to feel  better. 6)  If any fevers >101.5, worsening cough or shortness of breath, or worsening instead of improving, please return to be seen.  Prescriptions: HYDROMET 5-1.5 MG/5ML SYRP (HYDROCODONE-HOMATROPINE) one teaspoon at bedtime as needed cough, sedation precautions  #100cc x 0   Entered and Authorized by:   Eustaquio Boyden  MD   Signed by:   Eustaquio Boyden  MD on 02/05/2011   Method used:   Print then Give to Patient   RxID:   [id number]    Orders Added: 1)  Est. Patient Level III [14782]    Current Allergies (reviewed today): ! * LITHIUM ! TEGRETOL ! LIDOCAINE ! XYLOCAINE ! CODEINE ! * LAMICTAL * TUSSIOINEX  Appended Document: eye infection/alc pt states has tolerated hydromet in past.

## 2011-02-23 HISTORY — PX: TRANSTHORACIC ECHOCARDIOGRAM: SHX275

## 2011-02-28 ENCOUNTER — Encounter: Payer: Self-pay | Admitting: Family Medicine

## 2011-04-10 ENCOUNTER — Telehealth: Payer: Self-pay

## 2011-04-10 NOTE — Telephone Encounter (Signed)
She would be ok to get that immunization  Go ahead and schedule it -thanks

## 2011-04-10 NOTE — Telephone Encounter (Signed)
Left message for pt's husband to call back.

## 2011-04-10 NOTE — Telephone Encounter (Signed)
Pt's husband is coming in for Zostavax and pt wants to come in at same time to get a Zostavax also. Is it OK for pt to get Zostavax. Pt has already checked with her insurance co and they do take care of Zostavax injection.Please advise. Please notify pt's husband with answer and he will schedule [phone].

## 2011-04-11 NOTE — Telephone Encounter (Signed)
Patient's husband notified as instructed by telephone. Would like to come this Fri in AM. Scheduled nurse visit for 04/13/11 at 9:30am.

## 2011-04-13 ENCOUNTER — Ambulatory Visit (INDEPENDENT_AMBULATORY_CARE_PROVIDER_SITE_OTHER): Payer: 59 | Admitting: Family Medicine

## 2011-04-13 DIAGNOSIS — Z23 Encounter for immunization: Secondary | ICD-10-CM

## 2011-04-13 MED ORDER — ZOSTER VACCINE LIVE 19400 UNT/0.65ML ~~LOC~~ SOLR
0.6500 mL | Freq: Once | SUBCUTANEOUS | Status: AC
  Administered 2011-04-13: 19400 [IU] via SUBCUTANEOUS

## 2011-04-13 NOTE — Progress Notes (Signed)
  Subjective:    Patient ID: [name] [name], female    DOB: 1956/03/18, 55 y.o.   MRN: [id number]  HPI Here for shingle vaccine    Review of Systems     Objective:   Physical Exam        Assessment & Plan:

## 2011-04-27 NOTE — Op Note (Signed)
[place]  Patient:    [name] [name], [name] [name]                      MRN: [id number] Proc. Date: 03/18/00 Adm. Date:  [id number] Attending:  Henrene Dodge CC:         Anselm Pancoast. Zachery Dakins, M.D.             Roxy Manns, M.D. [facility]                           Operative Report  PREOPERATIVE DIAGNOSIS:  Chronic cholecystitis.  POSTOPERATIVE DIAGNOSIS:  Chronic cholecystitis.  OPERATION PERFORMED:  Laparoscopic cholecystectomy, attempted cholangiogram that was not completed.  SURGEON:  Anselm Pancoast. Zachery Dakins, M.D.  ASSISTANT:  ANESTHESIA:  INDICATIONS FOR PROCEDURE:  The patient is a 55 year old Caucasian female who was referred to me by Dr. Milinda Antis of [facility] for symptomatic gallbladder.  She weighs about 250, has had a problem with esophageal reflux and a hiatal hernia but then in May started having severe episodes of pain, had a cardiac catheterization and cardiac evaluation with no abnormalities noted.  Since then she has had other episodes.  They got an ultrasound of the gallbladder that showed stones.  She has had no time that she had been jaundiced.  Usually the episodes of pain occur approximately 10 oclock.  DESCRIPTION OF PROCEDURE:  The patient was taken to an operating suite. Preoperatively, she was given 3 gm of Unasyn.  She had PAS stockings and induction of general anesthesia.  The endotracheal tube was position, the oral tube to the stomach.  The abdomen was prepped with Betadine surgical scrub and solution and draped in a sterile manner.  A small incision was made in the line of the umbilicus down to the fatty adipose tissue and the fascia identified and picked up between two hemostats, a small opening made.  The underlying peritoneum was identified and this was sort of opened with Tresa Endo and then a traction suture was placed and a Hasson cannula introduced.  The gallbladder was not acutely inflamed or distended.  The  upper abdomen 10 mm trocar was placed under direct vision after anesthetizing the fascia with Marcaine.  The two lateral 5 mm trocars were placed by Dr. Orpah Greek at the appropriate position.  The gallbladder was grasped and retracted left and then lateral and we kind of opened up the peritoneum and the proximal portion of the gallbladder identifying a little structure that I first thought was a cystic duct.  Dr. Orpah Greek was thinking it was probably a blood vessel and we placed a clip flush with the junction basically at the gallbladder and then made a little nick and this was a blood vessel and I triply clipped the vessel proximally and then divided it.  The underlying cystic duct was right under it.  This was then clipped flush with the gallbladder and a little nick made. The duct was very small and attempts to get a taut catheter in it were not successful and I elected to go ahead and just clip the proximal cystic duct x 2 and divided it.  The gallbladder was then freed from its bed using the hook electrocautery.  Good hemostasis was obtained.  The gallbladder was placed in the Endocatch bag.  The bed was dry and then camera was placed in the upper 10 mm trocar site and the gallbladder within the bag was  brought out through the fascia.  The fascia was then closed with two figure-of-eights of 0 Vicryl. The irrigating fluid that had been used was aspirated.  Two lateral 5 mm trocars were withdrawn.  I had anesthetized the fascia at the umbilicus and then the subcutaneous wounds were closed with 4-0 Vicryl and then benzoin and Steri-Strips to the skin.  The patient tolerated the procedure nicely and was sent to the recovery room in stable postoperative condition. DD:  03/18/00 TD:  03/19/00 Job: [id number]

## 2011-08-21 ENCOUNTER — Encounter: Payer: Self-pay | Admitting: Family Medicine

## 2011-08-21 ENCOUNTER — Ambulatory Visit (INDEPENDENT_AMBULATORY_CARE_PROVIDER_SITE_OTHER): Payer: 59 | Admitting: Family Medicine

## 2011-08-21 VITALS — BP 132/82 | HR 65 | Temp 97.8°F | Wt 233.2 lb

## 2011-08-21 DIAGNOSIS — N750 Cyst of Bartholin's gland: Secondary | ICD-10-CM | POA: Insufficient documentation

## 2011-08-21 DIAGNOSIS — Z23 Encounter for immunization: Secondary | ICD-10-CM

## 2011-08-21 MED ORDER — TRAMADOL HCL 50 MG PO TABS: 50.0000 mg | ORAL_TABLET | Freq: Four times a day (QID) | ORAL | Status: DC | PRN

## 2011-08-21 MED ORDER — SULFAMETHOXAZOLE-TRIMETHOPRIM 800-160 MG PO TABS: 1.0000 | ORAL_TABLET | Freq: Two times a day (BID) | ORAL | Status: AC

## 2011-08-21 MED ORDER — FLUCONAZOLE 150 MG PO TABS: 150.0000 mg | ORAL_TABLET | Freq: Once | ORAL | Status: AC

## 2011-08-21 NOTE — Progress Notes (Signed)
Subjective:    Patient ID: [name] [name], female    DOB: [birth year], 55 y.o.   MRN: [id number]  HPI Abscess Patient presents for evaluation of a cutaneous abscess. Lesion is located in the right labia. Onset was 3 days ago. Symptoms have gradually worsened. Abscess has associated symptoms of pain. Patient does have previous history of cutaneous abscesses. Patient does not have diabetes.  Has had an abscess in same area in past. Not draining. No erythema.  No fevers, chills, nausea or vomiting.  [place].  Has not been applying warm compresses. Patient Active Problem List  Diagnoses  . ONYCHOMYCOSIS  . NEOPLASM OF UNCERTAIN BEHAVIOR OF SKIN  . HYPOTHYROIDISM  . HYPERCHOLESTEROLEMIA, PURE  . OBESITY  . BIPOLAR DISORDER UNSPECIFIED  . ALLERGIC RHINITIS  . GERD  . IBS  . ARTHRITIS, RIGHT HIP  . DEGENERATIVE DISC DISEASE, LUMBAR SPINE  . METATARSALGIA  . POSTMENOPAUSAL STATUS  . CHEST PAIN  . Bartholin gland cyst   Past Medical History  Diagnosis Date  . Diverticulosis of colon   . Hyperlipidemia   . Thyroid disease   . Liver cyst   . Allergy   . GERD (gastroesophageal reflux disease)   . IBS (irritable bowel syndrome)   . Bipolar 1 disorder   . Obesity   . Basal cell carcinoma   . Squamous cell carcinoma    Past Surgical History  Procedure Date  . Cardiovascular stress test 02/23/11  . Cardiac catheterization 04/1999  . Cataract extraction 09/2002  . Cholecystectomy   . Colonoscopy 11/2002   History  Substance Use Topics  . Smoking status: Never Smoker   . Smokeless tobacco: Not on file  . Alcohol Use: No   Family History  Problem Relation Age of Onset  . Stroke Mother   . Heart disease Mother   . Hypertension Father   . Diabetes Father   . Hyperlipidemia Father    Allergies  Allergen Reactions  . Carbamazepine     REACTION: skin crawls  . Codeine     REACTION: vomits blood  . Lamotrigine     REACTION: sores in  mouth, muscle aches  . Lidocaine     REACTION: swelling  . Lithium Swelling  . Tussionex Pennkinetic Er Other (See Comments)    To strong to take along with her other medication  . Xylocaine Swelling   No current outpatient prescriptions on file prior to visit.   Marland Kitchenreveiwed   Review of Systems See HPI    Objective:   Physical Exam BP 132/82  Pulse 65  Temp(Src) 97.8 F (36.6 C) (Oral)  Wt 233 lb 4 oz (105.802 kg)  General:  Well-developed,well-nourished,in no acute distress; alert,appropriate and cooperative throughout examination Head:  normocephalic and atraumatic.   Eyes:  vision grossly intact, pupils equal, pupils round, and pupils reactive to light.   Ears:  R ear normal and L ear normal.   Nose:  no external deformity.   Mouth:  good dentition.   Genitalia:          External: firm, non fluctuant raised area a base of right labia minora- bartholins cyst, no surrounding erythema, tender to touch.     Axillary Nodes:  No palpable lymphadenopathy Psych:  Cognition and judgment appear intact. Alert and cooperative with normal attention span and concentration. No apparent delusions, illusions, hallucinations    Assessment & Plan:   1. Bartholin gland cyst    New.  Non fluctuant  and not visible or palpable abscess so unable to I and D today. Advised warm soaks and compresses. Will also place on Bactrim only because she is leaving town in a couple of days. Tramadol as needed for pain. I recommended seeking immediate evaluation at urgent care or ER if abscess forms or she develops any systemic symptoms such as fever, chills, nausea or vomiting.

## 2011-09-05 ENCOUNTER — Encounter: Payer: Self-pay | Admitting: Family Medicine

## 2011-09-05 ENCOUNTER — Ambulatory Visit (INDEPENDENT_AMBULATORY_CARE_PROVIDER_SITE_OTHER): Payer: 59 | Admitting: Family Medicine

## 2011-09-05 VITALS — BP 112/80 | HR 60 | Temp 97.5°F | Wt 230.0 lb

## 2011-09-05 DIAGNOSIS — N390 Urinary tract infection, site not specified: Secondary | ICD-10-CM | POA: Insufficient documentation

## 2011-09-05 DIAGNOSIS — B354 Tinea corporis: Secondary | ICD-10-CM | POA: Insufficient documentation

## 2011-09-05 DIAGNOSIS — M549 Dorsalgia, unspecified: Secondary | ICD-10-CM

## 2011-09-05 DIAGNOSIS — N750 Cyst of Bartholin's gland: Secondary | ICD-10-CM

## 2011-09-05 LAB — POCT URINALYSIS DIPSTICK
Bilirubin, UA: NEGATIVE
Glucose, UA: NEGATIVE
Ketones, UA: NEGATIVE
Spec Grav, UA: 1.01
Urobilinogen, UA: 0.2

## 2011-09-05 MED ORDER — CLOTRIMAZOLE 1 % EX CREA: TOPICAL_CREAM | Freq: Two times a day (BID) | CUTANEOUS | Status: DC

## 2011-09-05 MED ORDER — CIPROFLOXACIN HCL 500 MG PO TABS: 500.0000 mg | ORAL_TABLET | Freq: Two times a day (BID) | ORAL | Status: AC

## 2011-09-05 NOTE — Patient Instructions (Signed)
Ringworm - Body (Tinea Corporis) Ringworm is a fungal infection of the skin and hair. Another name for this problem is Tinea Corporis. It has nothing to do with worms. A fungus is an organism that lives on dead cells (the outer layer of skin). It can involve the entire body. It can spread from infected pets. Tinea corporis can be a problem in wrestlers who may get the infection form other players/opponents, equipment and mats. DIAGNOSIS A skin scraping can be obtained from the affected area and by looking for fungus under the microscope. This is called a KOH examination.  HOME CARE INSTRUCTIONS  Ringworm may be treated with a topical antifungal cream, ointment, or oral medications.   If you are using a cream or ointment, wash infected skin. Dry it completely before application.   Scrub the skin with a buff puff or abrasive sponge using a shampoo with ketoconazole to remove dead skin and help treat the ringworm.   Have your pet treated by your veterinarian if it has the same infection.  SEEK MEDICAL CARE IF:  The ringworm patch (fungus) continues to spread after 7 days of treatment.   The rash is not gone in 4 weeks. Fungal infections are slow to respond to treatment. Some redness (erythema) may remain for several weeks after the fungus is gone.   The area becomes red, warm, tender, and swollen beyond the patch. This may be a secondary bacterial (germ) infection.   An oral temperature above 100.5develops.  Document Released: 11/23/2000 Document Re-Released: 09/23/2009 [facility] Patient Information 2011 [place], [place].

## 2011-09-05 NOTE — Progress Notes (Signed)
Subjective:    Patient ID: [name] [name], [name]    DOB: [birth year], 55 y.o.   MRN: [id number]   SUBJECTIVE:  [name] [name] is a 55 y.o. [name] who complains of abnormally colored erythematous circular lesion that developed on left thigh after her cat scratched her a few days ago.  Was told by PA at derm office that it was a staph infection.  Has been putting abx ointment on it with no improvement.  Lesion is getting larger and more itchy.  ?UTI- also complaining of increased urinary frequency, suprapubic discomfort, right sided back pain x1 week. No hematuria, nausea, fever or vomiting.  Patient Active Problem List  Diagnoses  . ONYCHOMYCOSIS  . NEOPLASM OF UNCERTAIN BEHAVIOR OF SKIN  . HYPOTHYROIDISM  . HYPERCHOLESTEROLEMIA, PURE  . OBESITY  . BIPOLAR DISORDER UNSPECIFIED  . ALLERGIC RHINITIS  . GERD  . IBS  . ARTHRITIS, RIGHT HIP  . DEGENERATIVE DISC DISEASE, LUMBAR SPINE  . METATARSALGIA  . POSTMENOPAUSAL STATUS  . CHEST PAIN  . Bartholin gland cyst  . Tinea corporis  . UTI (lower urinary tract infection)   Past Medical History  Diagnosis Date  . Diverticulosis of colon   . Hyperlipidemia   . Thyroid disease   . Liver cyst   . Allergy   . GERD (gastroesophageal reflux disease)   . IBS (irritable bowel syndrome)   . Bipolar 1 disorder   . Obesity   . Basal cell carcinoma   . Squamous cell carcinoma    Past Surgical History  Procedure Date  . Cardiovascular stress test 02/23/11  . Cardiac catheterization 04/1999  . Cataract extraction 09/2002  . Cholecystectomy   . Colonoscopy 11/2002   History  Substance Use Topics  . Smoking status: Never Smoker   . Smokeless tobacco: Not on file  . Alcohol Use: No   Family History  Problem Relation Age of Onset  . Stroke Mother   . Heart disease Mother   . Hypertension Father   . Diabetes Father   . Hyperlipidemia Father    Allergies  Allergen Reactions  . Carbamazepine     REACTION: skin crawls  .  Codeine     REACTION: vomits blood  . Lamotrigine     REACTION: sores in mouth, muscle aches  . Lidocaine     REACTION: swelling  . Lithium Swelling  . Tussionex Pennkinetic Er Other (See Comments)    To strong to take along with her other medication  . Xylocaine Swelling   Current Outpatient Prescriptions on File Prior to Visit  Medication Sig Dispense Refill  . ARIPiprazole (ABILIFY) 30 MG tablet Take 30 mg by mouth daily.        Marland Kitchen aspirin 81 MG tablet Take 81 mg by mouth daily.        Marland Kitchen buPROPion (WELLBUTRIN XL) 150 MG 24 hr tablet Take two tablets by mouth every morning       . Calcium Citrate-Vitamin D 315-250 MG-UNIT TABS Take 1 tablet by mouth 2 (two) times daily.        . cetirizine (ZYRTEC) 10 MG tablet Take 10 mg by mouth daily.        . Cholecalciferol (VITAMIN D) 2000 UNITS CAPS Take 1 capsule by mouth daily.        . clonazePAM (KLONOPIN) 1 MG tablet Take 1 mg by mouth at bedtime as needed.        . diclofenac sodium (VOLTAREN) 1 % GEL 4g applied to  affected area as needed       . lansoprazole (PREVACID) 30 MG capsule Take 30 mg by mouth daily.        Marland Kitchen levothyroxine (SYNTHROID, LEVOTHROID) 50 MCG tablet Take 50 mcg by mouth daily.        Marland Kitchen lovastatin (MEVACOR) 40 MG tablet Take 40 mg by mouth daily.        . Multiple Vitamin (MULTIVITAMIN) tablet Take 1 tablet by mouth daily.        Marland Kitchen olanzapine-fluoxetine (SYMBYAX) 3-25 MG per capsule Take 1 capsule by mouth at bedtime.        . Omega-3 Fatty Acids (FISH OIL) 1000 MG CAPS Take 1 capsule by mouth daily.        . traMADol (ULTRAM) 50 MG tablet Take 1 tablet (50 mg total) by mouth every 6 (six) hours as needed for pain.  20 tablet  0  . zolpidem (AMBIEN) 10 MG tablet Take 10 mg by mouth daily.         The PMH, PSH, Social History, Family History, Medications, and allergies have been reviewed in [facility], and have been updated if relevant.  OBJECTIVE:  BP 112/80  Pulse 60  Temp(Src) 97.5 F (36.4 C) (Oral)  Wt 230 lb  (104.327 kg)  Appears well, in no apparent distress.  Vital signs are normal. Circular nickle sized lesion on left upper thigh- elevated borders with central clearing.rest of skin is clear. Abd:  Soft, NT, mild suprapubic tenderness, no CVA tenderness.  Assessment and Plan: 1 Tinea corporis   New.  Will treat with topical clotrimazole twice daily See pt instructions for details.     2 UTI (lower urinary tract infection)   UA pos for blood and LE. Will treat with cipro, send urine for cx. ciprofloxacin (CIPRO) 500 MG tablet

## 2011-09-07 LAB — URINE CULTURE
Colony Count: NO GROWTH
Organism ID, Bacteria: NO GROWTH

## 2011-09-17 ENCOUNTER — Encounter: Payer: Self-pay | Admitting: Family Medicine

## 2011-09-17 ENCOUNTER — Ambulatory Visit (INDEPENDENT_AMBULATORY_CARE_PROVIDER_SITE_OTHER): Payer: 59 | Admitting: Family Medicine

## 2011-09-17 VITALS — BP 110/80 | HR 76 | Temp 98.2°F | Ht 60.0 in | Wt 233.2 lb

## 2011-09-17 DIAGNOSIS — N390 Urinary tract infection, site not specified: Secondary | ICD-10-CM

## 2011-09-17 DIAGNOSIS — M545 Low back pain, unspecified: Secondary | ICD-10-CM | POA: Insufficient documentation

## 2011-09-17 DIAGNOSIS — M549 Dorsalgia, unspecified: Secondary | ICD-10-CM

## 2011-09-17 LAB — POCT UA - MICROSCOPIC ONLY
Bacteria, U Microscopic: 0
Casts, Ur, LPF, POC: 0

## 2011-09-17 LAB — POCT URINALYSIS DIPSTICK
Glucose, UA: NEGATIVE
Nitrite, UA: NEGATIVE
Spec Grav, UA: 1.02
Urobilinogen, UA: 0.2

## 2011-09-17 NOTE — Assessment & Plan Note (Signed)
Mostly on the R when straightening from a bend -- though most of her tenderness is on the L (puzzling)  Is exercising more , and does have a hx of deg disc dz (though no neurol red flags today)  Suspect muscle strain Adv heat/ occ aleve / better stretching with exercise Given handout Will f/u if worse or no imp or if any neurol s/s develop

## 2011-09-17 NOTE — Progress Notes (Signed)
Subjective:    Patient ID: [name] [name], female    DOB: 07/03/1956, 55 y.o.   MRN: [id number]  HPI Here for uti f/u Last visit had urinary symptoms with some blood in urine on ua Was empirically covered with cipro and then urine cx was neg Some symptoms did improve- constant (that is better ) - no frequency or other symptoms  Never had fever or vomiting   Still having low back pain-when she bends she gets a catch in it ( when coming up from bending) Back pain does not radiate to legs  No numbness or weakness Worse on R side  Taking aleve occasionally  No heat or ice    Does not remember a particular injury Has been exercising - walking and arms / abs exercise / bike Has lost 20 lb !! Almost every day     Urine dip today is entirely neg today as well as micro  Patient Active Problem List  Diagnoses  . ONYCHOMYCOSIS  . NEOPLASM OF UNCERTAIN BEHAVIOR OF SKIN  . HYPOTHYROIDISM  . HYPERCHOLESTEROLEMIA, PURE  . OBESITY  . BIPOLAR DISORDER UNSPECIFIED  . ALLERGIC RHINITIS  . GERD  . IBS  . ARTHRITIS, RIGHT HIP  . DEGENERATIVE DISC DISEASE, LUMBAR SPINE  . METATARSALGIA  . POSTMENOPAUSAL STATUS  . CHEST PAIN  . Bartholin gland cyst  . Tinea corporis  . UTI (lower urinary tract infection)  . Back pain   Past Medical History  Diagnosis Date  . Diverticulosis of colon   . Hyperlipidemia   . Thyroid disease   . Liver cyst   . Allergy   . GERD (gastroesophageal reflux disease)   . IBS (irritable bowel syndrome)   . Bipolar 1 disorder   . Obesity   . Basal cell carcinoma   . Squamous cell carcinoma    Past Surgical History  Procedure Date  . Cardiovascular stress test 02/23/11  . Cardiac catheterization 04/1999  . Cataract extraction 09/2002  . Cholecystectomy   . Colonoscopy 11/2002   History  Substance Use Topics  . Smoking status: Never Smoker   . Smokeless tobacco: Not on file  . Alcohol Use: No   Family History  Problem Relation Age of Onset  .  Stroke Mother   . Heart disease Mother   . Hypertension Father   . Diabetes Father   . Hyperlipidemia Father    Allergies  Allergen Reactions  . Carbamazepine     REACTION: skin crawls  . Codeine     REACTION: vomits blood  . Lamotrigine Other (See Comments)    REACTION: sores in mouth, muscle aches and also bipolar made her manic  . Lidocaine     REACTION: swelling  . Lithium Swelling  . Tussionex Pennkinetic Er Other (See Comments)    To strong to take along with her other medication  . Xylocaine Swelling   Current Outpatient Prescriptions on File Prior to Visit  Medication Sig Dispense Refill  . ARIPiprazole (ABILIFY) 30 MG tablet Take 30 mg by mouth daily.        Marland Kitchen aspirin 81 MG tablet Take 81 mg by mouth daily.        Marland Kitchen buPROPion (WELLBUTRIN XL) 150 MG 24 hr tablet Take two tablets by mouth every morning       . Calcium Citrate-Vitamin D 315-250 MG-UNIT TABS Take 1 tablet by mouth daily.       . cetirizine (ZYRTEC) 10 MG tablet Take 10 mg by mouth daily.        Marland Kitchen  Cholecalciferol (VITAMIN D) 2000 UNITS CAPS Take 1 capsule by mouth daily.        . clonazePAM (KLONOPIN) 1 MG tablet Take 1 mg by mouth at bedtime as needed.        . lansoprazole (PREVACID) 30 MG capsule Take 30 mg by mouth daily.        Marland Kitchen levothyroxine (SYNTHROID, LEVOTHROID) 50 MCG tablet Take 50 mcg by mouth daily.        Marland Kitchen lovastatin (MEVACOR) 40 MG tablet Take 40 mg by mouth daily.        . Multiple Vitamin (MULTIVITAMIN) tablet Take 1 tablet by mouth daily.        Marland Kitchen olanzapine-fluoxetine (SYMBYAX) 3-25 MG per capsule Take 1 capsule by mouth at bedtime.        . Omega-3 Fatty Acids (FISH OIL) 1000 MG CAPS Take 1 capsule by mouth daily.        Marland Kitchen zolpidem (AMBIEN) 10 MG tablet Take 10 mg by mouth daily.        . clotrimazole (LOTRIMIN) 1 % cream Apply topically 2 (two) times daily.  30 g  0  . diclofenac sodium (VOLTAREN) 1 % GEL 4g applied to affected area as needed       . retapamulin (ALTABAX) 1 % ointment  Apply 1 application topically 2 (two) times daily.        . traMADol (ULTRAM) 50 MG tablet Take 1 tablet (50 mg total) by mouth every 6 (six) hours as needed for pain.  20 tablet  0        Review of Systems Review of Systems  Constitutional: Negative for fever, appetite change, fatigue and unexpected weight change.  Eyes: Negative for pain and visual disturbance.  Respiratory: Negative for cough and shortness of breath.   Cardiovascular: Negative for cp or palpitations    Gastrointestinal: Negative for nausea, diarrhea and constipation.  Genitourinary: Negative for urgency and frequency. no hematuria  MSK pos for low back pain , no joint swelling  Skin: Negative for pallor or rash   Neurological: Negative for weakness, light-headedness, numbness and headaches.  Hematological: Negative for adenopathy. Does not bruise/bleed easily.  Psychiatric/Behavioral: Negative for dysphoric mood. The patient is not nervous/anxious.          Objective:   Physical Exam  Constitutional: She appears well-developed and well-nourished. No distress.       overwt and well appearing  Wt loss noted   HENT:  Head: Normocephalic and atraumatic.  Mouth/Throat: Oropharynx is clear and moist.  Eyes: Conjunctivae and EOM are normal. Pupils are equal, round, and reactive to light.  Neck: Normal range of motion. Neck supple. No thyromegaly present.  Cardiovascular: Normal rate, regular rhythm, normal heart sounds and intact distal pulses.   Pulmonary/Chest: Effort normal and breath sounds normal. No respiratory distress. She has no wheezes.  Abdominal: Soft. Bowel sounds are normal. She exhibits no distension. There is no tenderness.       No suprapubic tenderness   Musculoskeletal: She exhibits tenderness. She exhibits no edema.       Nl rom LS  Some tenderness in L lower back muculature No bony tenderness No cva tenderness Pain on L lat bend Nl gait   Lymphadenopathy:    She has no cervical  adenopathy.  Neurological: She is alert. She has normal strength and normal reflexes. She displays no atrophy. No cranial nerve deficit or sensory deficit. She exhibits normal muscle tone. Coordination and gait normal.  Skin:  Skin is warm and dry. No rash noted. No erythema. No pallor.  Psychiatric: She has a normal mood and affect.          Assessment & Plan:

## 2011-09-17 NOTE — Assessment & Plan Note (Signed)
This seems to be resolved- in fact cx came back neg originally and no urinary symptoms Today ua and micro are neg - so this is not the cause of back pain Reminded to drink water and ways to prevent uti

## 2011-09-17 NOTE — Patient Instructions (Signed)
Keep up good exercise if it does not hurt your back  When you are better- work on abdominal strength  Keep working on weight loss Aleve occasionally if you need it with food  Try heat for 10 minutes at a time to relax muscles  Let me know if not improving in next 2 weeks Urine looks clear today

## 2011-10-04 ENCOUNTER — Other Ambulatory Visit (INDEPENDENT_AMBULATORY_CARE_PROVIDER_SITE_OTHER): Payer: 59

## 2011-10-04 ENCOUNTER — Telehealth: Payer: Self-pay | Admitting: Family Medicine

## 2011-10-04 DIAGNOSIS — E039 Hypothyroidism, unspecified: Secondary | ICD-10-CM

## 2011-10-04 DIAGNOSIS — Z0000 Encounter for general adult medical examination without abnormal findings: Secondary | ICD-10-CM

## 2011-10-04 DIAGNOSIS — E7800 Pure hypercholesterolemia, unspecified: Secondary | ICD-10-CM

## 2011-10-04 NOTE — Progress Notes (Signed)
Addended by: Baldomero Lamy on: 10/04/2011 09:12 AM   Modules accepted: Orders

## 2011-10-04 NOTE — Telephone Encounter (Signed)
Message copied by Judy Pimple on Thu Oct 04, 2011  7:26 AM ------      Message from: Alvina Chou      Created: Tue Oct 02, 2011  3:34 PM      Regarding: labs for Thursday, 10-25       Patient is scheduled for CPX labs, please order future labs, Thanks , Camelia Eng

## 2011-10-09 LAB — COMPREHENSIVE METABOLIC PANEL
Albumin: 4.2 g/dL (ref 3.5–5.5)
Alkaline Phosphatase: 97 IU/L (ref 25–150)
BUN: 15 mg/dL (ref 6–24)
CO2: 25 mmol/L (ref 20–32)
Calcium: 9.6 mg/dL (ref 8.7–10.2)
Chloride: 103 mmol/L (ref 97–108)
Creatinine, Ser: 1.17 mg/dL — ABNORMAL HIGH (ref 0.57–1.00)
Globulin, Total: 2.6 g/dL (ref 1.5–4.5)
Glucose: 92 mg/dL (ref 65–99)
Total Protein: 6.8 g/dL (ref 6.0–8.5)

## 2011-10-09 LAB — LIPID PANEL
LDL Calculated: 77 mg/dL (ref 0–99)
VLDL Cholesterol Cal: 32 mg/dL (ref 5–40)

## 2011-10-09 LAB — CBC WITH DIFFERENTIAL
Basos: 0 % (ref 0–3)
Eos: 0 % (ref 0–7)
HCT: 40.1 % (ref 34.0–46.6)
Hemoglobin: 13.6 g/dL (ref 11.1–15.9)
Immature Granulocytes: 0 % (ref 0–2)
Lymphocytes Absolute: 1.7 10*3/uL (ref 0.7–4.5)
MCV: 86 fL (ref 79–97)
Monocytes: 7 % (ref 4–13)
Platelets: 181 10*3/uL (ref 140–415)
RBC: 4.67 x10E6/uL (ref 3.77–5.28)
WBC: 5.2 10*3/uL (ref 4.0–10.5)

## 2011-10-10 ENCOUNTER — Other Ambulatory Visit: Payer: 59

## 2011-10-15 ENCOUNTER — Ambulatory Visit (INDEPENDENT_AMBULATORY_CARE_PROVIDER_SITE_OTHER): Payer: 59 | Admitting: Family Medicine

## 2011-10-15 ENCOUNTER — Encounter: Payer: Self-pay | Admitting: Family Medicine

## 2011-10-15 VITALS — BP 114/82 | HR 84 | Temp 98.0°F | Ht 61.0 in | Wt 230.8 lb

## 2011-10-15 DIAGNOSIS — Z1231 Encounter for screening mammogram for malignant neoplasm of breast: Secondary | ICD-10-CM | POA: Insufficient documentation

## 2011-10-15 DIAGNOSIS — K589 Irritable bowel syndrome without diarrhea: Secondary | ICD-10-CM

## 2011-10-15 DIAGNOSIS — E039 Hypothyroidism, unspecified: Secondary | ICD-10-CM

## 2011-10-15 DIAGNOSIS — Z0000 Encounter for general adult medical examination without abnormal findings: Secondary | ICD-10-CM

## 2011-10-15 DIAGNOSIS — E7800 Pure hypercholesterolemia, unspecified: Secondary | ICD-10-CM

## 2011-10-15 MED ORDER — LOVASTATIN 40 MG PO TABS: 40.0000 mg | ORAL_TABLET | Freq: Every day | ORAL | Status: DC

## 2011-10-15 MED ORDER — LEVOTHYROXINE SODIUM 50 MCG PO TABS: 50.0000 ug | ORAL_TABLET | Freq: Every day | ORAL | Status: DC

## 2011-10-15 MED ORDER — LANSOPRAZOLE 30 MG PO CPDR: 30.0000 mg | DELAYED_RELEASE_CAPSULE | Freq: Every day | ORAL | Status: DC

## 2011-10-15 NOTE — Assessment & Plan Note (Signed)
Scheduled annual screening mammogram Nl breast exam today  Encouraged monthly self exams   

## 2011-10-15 NOTE — Assessment & Plan Note (Signed)
On mevacor and good diet and exercise  Disc goals for lipids and reasons to control them Rev labs with pt Rev low sat fat diet in detail  Trig up a bit - rev low fat diet

## 2011-10-15 NOTE — Assessment & Plan Note (Signed)
Doing very well with this  Eating more fiber and apples

## 2011-10-15 NOTE — Assessment & Plan Note (Signed)
theraputic tsh No changes No clinical changes  Doing well

## 2011-10-15 NOTE — Patient Instructions (Addendum)
We will schedule annual mammogram at check out  Please send to Dr Jarold Motto for last colonoscopy-- I have orders but no report  Keep working on weight loss/ healthy diet and exercise  I'm glad you are doing well

## 2011-10-15 NOTE — Progress Notes (Signed)
Subjective:    Patient ID: [name] [name], female    DOB: Feb 16, 1956, 55 y.o.   MRN: [id number]  HPI Here for annual check up of chronic medical problems and to rev health mt list Has medicare now  Feels good    bp is 114/82 Good overall  Wt is down 3 lb Has really been working on it  bmi 43- obese Diet- much better than it used to be  Exercise- 30 min on stationary bike , walking / arms and abs program- and that makes her feel wonderful Helps her back too  Is drinking enough water   This lipid check good  On mevacor and diet  No results found for this basename: CHOL   Lab Results  Component Value Date   HDL 64 10/04/2011   Lab Results  Component Value Date   LDLCALC 77 10/04/2011   Lab Results  Component Value Date   TRIG 162* 10/04/2011   No results found for this basename: CHOLHDL   No results found for this basename: LDLDIRECT   trig up a bit - will try to watch fat in diet   Hx of hypothyroid No clinical change- feels like she is doing well with that  No hair / skin change  Tsh theraputic  Lab Results  Component Value Date   TSH 3.080 10/04/2011     Mam 11/11- needs to sched that at [facility]  Self exam - no  Breast   Pap 9/11 No abn paps- last 3 normal  No gyn symptoms or new partners No pain or d/c - does have some dryness   Td 10/10  colonosc - thinks 2003- was sick -- ? Saw Dr Jarold Motto   Flu shot in sept   Patient Active Problem List  Diagnoses  . ONYCHOMYCOSIS  . NEOPLASM OF UNCERTAIN BEHAVIOR OF SKIN  . HYPOTHYROIDISM  . HYPERCHOLESTEROLEMIA, PURE  . OBESITY  . BIPOLAR DISORDER UNSPECIFIED  . ALLERGIC RHINITIS  . GERD  . IBS  . ARTHRITIS, RIGHT HIP  . DEGENERATIVE DISC DISEASE, LUMBAR SPINE  . METATARSALGIA  . POSTMENOPAUSAL STATUS  . CHEST PAIN  . Bartholin gland cyst  . Tinea corporis  . Back pain  . Routine general medical examination at a health care facility  . Other screening mammogram   Past Medical History    Diagnosis Date  . Diverticulosis of colon   . Hyperlipidemia   . Thyroid disease   . Liver cyst   . Allergy   . GERD (gastroesophageal reflux disease)   . IBS (irritable bowel syndrome)   . Bipolar 1 disorder   . Obesity   . Basal cell carcinoma   . Squamous cell carcinoma    Past Surgical History  Procedure Date  . Cardiovascular stress test 02/23/11  . Cardiac catheterization 04/1999  . Cataract extraction 09/2002  . Cholecystectomy   . Colonoscopy 11/2002   History  Substance Use Topics  . Smoking status: Never Smoker   . Smokeless tobacco: Not on file  . Alcohol Use: No   Family History  Problem Relation Age of Onset  . Stroke Mother   . Heart disease Mother   . Hypertension Father   . Diabetes Father   . Hyperlipidemia Father    Allergies  Allergen Reactions  . Carbamazepine     REACTION: skin crawls  . Codeine     REACTION: vomits blood  . Lamotrigine Other (See Comments)    REACTION: sores in mouth, muscle aches  and also bipolar made her manic  . Lidocaine     REACTION: swelling  . Lithium Swelling  . Tussionex Pennkinetic Er Other (See Comments)    To strong to take along with her other medication  . Xylocaine Swelling   Current Outpatient Prescriptions on File Prior to Visit  Medication Sig Dispense Refill  . ARIPiprazole (ABILIFY) 30 MG tablet Take 30 mg by mouth daily.        Marland Kitchen aspirin 81 MG tablet Take 81 mg by mouth daily.        Marland Kitchen buPROPion (WELLBUTRIN XL) 150 MG 24 hr tablet Take two tablets by mouth every morning       . Calcium Citrate-Vitamin D 315-250 MG-UNIT TABS Take 1 tablet by mouth daily.       . cetirizine (ZYRTEC) 10 MG tablet Take 10 mg by mouth daily.        . Cholecalciferol (VITAMIN D) 2000 UNITS CAPS Take 1 capsule by mouth daily.        . clonazePAM (KLONOPIN) 1 MG tablet Take 1 mg by mouth at bedtime as needed.        . Multiple Vitamin (MULTIVITAMIN) tablet Take 1 tablet by mouth daily.        . naproxen sodium (ALEVE)  220 MG tablet Take 220 mg by mouth 2 (two) times daily with a meal.        . olanzapine-fluoxetine (SYMBYAX) 3-25 MG per capsule Take 1 capsule by mouth at bedtime.        . Omega-3 Fatty Acids (FISH OIL) 1000 MG CAPS Take 1 capsule by mouth daily.        Marland Kitchen zolpidem (AMBIEN) 10 MG tablet Take 10 mg by mouth daily.        . clotrimazole (LOTRIMIN) 1 % cream Apply topically 2 (two) times daily.  30 g  0  . diclofenac sodium (VOLTAREN) 1 % GEL 4g applied to affected area as needed       . retapamulin (ALTABAX) 1 % ointment Apply 1 application topically 2 (two) times daily.        . traMADol (ULTRAM) 50 MG tablet Take 1 tablet (50 mg total) by mouth every 6 (six) hours as needed for pain.  20 tablet  0        Review of Systems Review of Systems  Constitutional: Negative for fever, appetite change, fatigue and unexpected weight change.  Eyes: Negative for pain and visual disturbance.  Respiratory: Negative for cough and shortness of breath.   Cardiovascular: Negative for cp or palpitations    Gastrointestinal: Negative for nausea, diarrhea and constipation.  Genitourinary: Negative for urgency and frequency.  Skin: Negative for pallor or rash   Neurological: Negative for weakness, light-headedness, numbness and headaches.  Hematological: Negative for adenopathy. Does not bruise/bleed easily.  Psychiatric/Behavioral: Negative for dysphoric mood. The patient is not nervous/anxious.          Objective:   Physical Exam  Constitutional: She appears well-developed and well-nourished. No distress.       Obese and well appearing   HENT:  Head: Normocephalic and atraumatic.  Right Ear: External ear normal.  Left Ear: External ear normal.  Nose: Nose normal.  Mouth/Throat: Oropharynx is clear and moist.  Eyes: Conjunctivae and EOM are normal. Pupils are equal, round, and reactive to light. No scleral icterus.  Neck: Normal range of motion. Neck supple. No JVD present. Carotid bruit is not  present. No thyromegaly present.  Cardiovascular: Normal  rate, regular rhythm, normal heart sounds and intact distal pulses.  Exam reveals no gallop.   Pulmonary/Chest: Effort normal and breath sounds normal. No respiratory distress. She has no wheezes. She exhibits no tenderness.  Abdominal: Soft. Bowel sounds are normal. She exhibits no distension, no abdominal bruit and no mass. There is no tenderness.  Genitourinary: No breast swelling, tenderness, discharge or bleeding.  Musculoskeletal: Normal range of motion. She exhibits no edema and no tenderness.  Lymphadenopathy:    She has no cervical adenopathy.  Neurological: She is alert. She has normal reflexes. No cranial nerve deficit. Coordination normal.  Skin: Skin is warm and dry. No rash noted. No erythema. No pallor.  Psychiatric: She has a normal mood and affect.          Assessment & Plan:

## 2011-10-18 NOTE — Assessment & Plan Note (Signed)
Reviewed health habits including diet and exercise and skin cancer prevention Also reviewed health mt list, fam hx and immunizations  Disc imp of wt loss to future health  Rev wellness labs

## 2011-12-05 ENCOUNTER — Ambulatory Visit: Payer: Self-pay | Admitting: Family Medicine

## 2011-12-10 ENCOUNTER — Encounter: Payer: Self-pay | Admitting: Family Medicine

## 2011-12-12 ENCOUNTER — Encounter: Payer: Self-pay | Admitting: *Deleted

## 2012-02-06 DIAGNOSIS — F314 Bipolar disorder, current episode depressed, severe, without psychotic features: Secondary | ICD-10-CM | POA: Diagnosis not present

## 2012-03-21 DIAGNOSIS — E782 Mixed hyperlipidemia: Secondary | ICD-10-CM | POA: Diagnosis not present

## 2012-03-21 DIAGNOSIS — I10 Essential (primary) hypertension: Secondary | ICD-10-CM | POA: Diagnosis not present

## 2012-03-27 DIAGNOSIS — F314 Bipolar disorder, current episode depressed, severe, without psychotic features: Secondary | ICD-10-CM | POA: Diagnosis not present

## 2012-04-11 ENCOUNTER — Ambulatory Visit (INDEPENDENT_AMBULATORY_CARE_PROVIDER_SITE_OTHER): Payer: 59 | Admitting: Family Medicine

## 2012-04-11 ENCOUNTER — Encounter: Payer: Self-pay | Admitting: Family Medicine

## 2012-04-11 VITALS — BP 110/72 | HR 66 | Temp 98.0°F | Ht 61.0 in | Wt 231.2 lb

## 2012-04-11 DIAGNOSIS — H6980 Other specified disorders of Eustachian tube, unspecified ear: Secondary | ICD-10-CM | POA: Insufficient documentation

## 2012-04-11 DIAGNOSIS — H6120 Impacted cerumen, unspecified ear: Secondary | ICD-10-CM

## 2012-04-11 DIAGNOSIS — H6121 Impacted cerumen, right ear: Secondary | ICD-10-CM

## 2012-04-11 MED ORDER — FLUTICASONE PROPIONATE 50 MCG/ACT NA SUSP: 2.0000 | Freq: Every day | NASAL | Status: DC

## 2012-04-11 NOTE — Progress Notes (Signed)
Subjective:    Patient ID: [name] [name], female    DOB: 01-Jan-1956, 56 y.o.   MRN: [id number]  HPI Trouble with R ear Poor hearing in it  Thinks she stuck a Q tip in too far No bleeding or discharge Feels like under water  Does tend to have allergy symptoms  Uses zyrtec   Patient Active Problem List  Diagnoses  . ONYCHOMYCOSIS  . NEOPLASM OF UNCERTAIN BEHAVIOR OF SKIN  . HYPOTHYROIDISM  . HYPERCHOLESTEROLEMIA, PURE  . OBESITY  . BIPOLAR DISORDER UNSPECIFIED  . ALLERGIC RHINITIS  . GERD  . IBS  . ARTHRITIS, RIGHT HIP  . DEGENERATIVE DISC DISEASE, LUMBAR SPINE  . METATARSALGIA  . POSTMENOPAUSAL STATUS  . CHEST PAIN  . Bartholin gland cyst  . Tinea corporis  . Back pain  . Routine general medical examination at a health care facility  . Other screening mammogram  . ETD (eustachian tube dysfunction)  . Cerumen impaction, right   Past Medical History  Diagnosis Date  . Diverticulosis of colon   . Hyperlipidemia   . Thyroid disease   . Liver cyst   . Allergy   . GERD (gastroesophageal reflux disease)   . IBS (irritable bowel syndrome)   . Bipolar 1 disorder   . Obesity   . Basal cell carcinoma   . Squamous cell carcinoma    Past Surgical History  Procedure Date  . Cardiovascular stress test 02/23/11  . Cardiac catheterization 04/1999  . Cataract extraction 09/2002  . Cholecystectomy   . Colonoscopy 11/2002   History  Substance Use Topics  . Smoking status: Never Smoker   . Smokeless tobacco: Not on file  . Alcohol Use: No   Family History  Problem Relation Age of Onset  . Stroke Mother   . Heart disease Mother   . Hypertension Father   . Diabetes Father   . Hyperlipidemia Father    Allergies  Allergen Reactions  . Carbamazepine     REACTION: skin crawls  . Codeine     REACTION: vomits blood  . Hydrocod Polst-Cpm Polst Er Other (See Comments)    To strong to take along with her other medication  . Lamotrigine Other (See Comments)   REACTION: sores in mouth, muscle aches and also bipolar made her manic  . Lidocaine     REACTION: swelling  . Lidocaine Hcl Swelling  . Lithium Swelling   Current Outpatient Prescriptions on File Prior to Visit  Medication Sig Dispense Refill  . ARIPiprazole (ABILIFY) 30 MG tablet Take 30 mg by mouth daily.        Marland Kitchen aspirin 81 MG tablet Take 81 mg by mouth daily.        Marland Kitchen buPROPion (WELLBUTRIN XL) 150 MG 24 hr tablet Take two tablets by mouth every morning       . Calcium Citrate-Vitamin D 315-250 MG-UNIT TABS Take 1 tablet by mouth daily.       . cetirizine (ZYRTEC) 10 MG tablet Take 10 mg by mouth daily.        . Cholecalciferol (VITAMIN D) 2000 UNITS CAPS Take 1 capsule by mouth daily.        . clonazePAM (KLONOPIN) 1 MG tablet Take 1 mg by mouth at bedtime as needed.        . lansoprazole (PREVACID) 30 MG capsule Take 1 capsule (30 mg total) by mouth daily.  90 capsule  3  . levothyroxine (SYNTHROID, LEVOTHROID) 50 MCG tablet Take 1 tablet (50  mcg total) by mouth daily.  90 tablet  3  . lovastatin (MEVACOR) 40 MG tablet Take 1 tablet (40 mg total) by mouth daily.  90 tablet  3  . Multiple Vitamin (MULTIVITAMIN) tablet Take 1 tablet by mouth daily.        . naproxen sodium (ALEVE) 220 MG tablet Take 220 mg by mouth 2 (two) times daily with a meal.        . olanzapine-fluoxetine (SYMBYAX) 3-25 MG per capsule Take 1 capsule by mouth at bedtime.        . Omega-3 Fatty Acids (FISH OIL) 1000 MG CAPS Take 1 capsule by mouth daily.        Marland Kitchen zolpidem (AMBIEN) 10 MG tablet Take 10 mg by mouth daily.        . fluticasone (FLONASE) 50 MCG/ACT nasal spray Place 2 sprays into the nose daily.  16 g  6      Review of Systems Review of Systems  Constitutional: Negative for fever, appetite change, fatigue and unexpected weight change.  Eyes: Negative for pain and visual disturbance.  ENT pos for rhinorrhea/ sneezing/ ear discomfort  Respiratory: Negative for cough and shortness of breath.     Cardiovascular: Negative for cp or palpitations    Gastrointestinal: Negative for nausea, diarrhea and constipation.  Genitourinary: Negative for urgency and frequency.  Skin: Negative for pallor or rash   Neurological: Negative for weakness, light-headedness, numbness and headaches.  Hematological: Negative for adenopathy. Does not bruise/bleed easily.  Psychiatric/Behavioral: Negative for dysphoric mood. The patient is not nervous/anxious.          Objective:   Physical Exam  Constitutional: She appears well-developed and well-nourished. No distress.       Obese and well appearing   HENT:  Head: Normocephalic and atraumatic.  Mouth/Throat: Oropharynx is clear and moist.       R ear- total cerumen impaction/ cleared by simple ear irrigation After that - both TMs have significant eff/ fluid levels  No redness or bulging  Eyes: Conjunctivae and EOM are normal. Pupils are equal, round, and reactive to light. No scleral icterus.  Neck: Normal range of motion. Neck supple. No JVD present. No thyromegaly present.  Cardiovascular: Normal rate, regular rhythm and normal heart sounds.  Exam reveals no gallop.   Pulmonary/Chest: Effort normal and breath sounds normal.  Musculoskeletal: She exhibits no edema.  Lymphadenopathy:    She has no cervical adenopathy.  Neurological: She is alert. No cranial nerve deficit. Coordination normal.  Skin: Skin is warm and dry. No rash noted.  Psychiatric: She has a normal mood and affect.          Assessment & Plan:

## 2012-04-11 NOTE — Assessment & Plan Note (Signed)
This was resolved with simple ear irrigation  Will use debrox at home in future prn

## 2012-04-11 NOTE — Assessment & Plan Note (Signed)
With eff behind both ears and trouble hearing  Will add flonase ns  Update if not starting to improve in 1-2 week or if worsening   Update if any pain

## 2012-04-11 NOTE — Patient Instructions (Signed)
Use flonase once daily to help unblock your ears (via your sinuses) Continue the zyrtec Twice a month use some debrox solution over the counter in right ear to prevent wax build up Let me know if no improvement

## 2012-04-12 ENCOUNTER — Emergency Department: Payer: Self-pay | Admitting: *Deleted

## 2012-04-12 DIAGNOSIS — N750 Cyst of Bartholin's gland: Secondary | ICD-10-CM | POA: Diagnosis not present

## 2012-04-12 DIAGNOSIS — F319 Bipolar disorder, unspecified: Secondary | ICD-10-CM | POA: Diagnosis not present

## 2012-04-12 DIAGNOSIS — N764 Abscess of vulva: Secondary | ICD-10-CM | POA: Diagnosis not present

## 2012-04-12 DIAGNOSIS — N751 Abscess of Bartholin's gland: Secondary | ICD-10-CM | POA: Diagnosis not present

## 2012-04-29 DIAGNOSIS — F314 Bipolar disorder, current episode depressed, severe, without psychotic features: Secondary | ICD-10-CM | POA: Diagnosis not present

## 2012-05-13 ENCOUNTER — Encounter: Payer: Self-pay | Admitting: Family Medicine

## 2012-05-13 ENCOUNTER — Ambulatory Visit (INDEPENDENT_AMBULATORY_CARE_PROVIDER_SITE_OTHER): Payer: 59 | Admitting: Family Medicine

## 2012-05-13 ENCOUNTER — Ambulatory Visit (INDEPENDENT_AMBULATORY_CARE_PROVIDER_SITE_OTHER)
Admission: RE | Admit: 2012-05-13 | Discharge: 2012-05-13 | Disposition: A | Discharge status: Home / Self Care | Payer: 59 | Source: Ambulatory Visit | Attending: Family Medicine | Admitting: Family Medicine

## 2012-05-13 VITALS — BP 112/88 | HR 67 | Temp 98.1°F | Ht 61.75 in | Wt 231.8 lb

## 2012-05-13 DIAGNOSIS — M79609 Pain in unspecified limb: Secondary | ICD-10-CM | POA: Diagnosis not present

## 2012-05-13 DIAGNOSIS — M79601 Pain in right arm: Secondary | ICD-10-CM

## 2012-05-13 MED ORDER — FLUTICASONE PROPIONATE 50 MCG/ACT NA SUSP: 2.0000 | Freq: Every day | NASAL | Status: DC

## 2012-05-13 MED ORDER — LANSOPRAZOLE 30 MG PO CPDR: 30.0000 mg | DELAYED_RELEASE_CAPSULE | Freq: Every day | ORAL | Status: DC

## 2012-05-13 MED ORDER — LEVOTHYROXINE SODIUM 50 MCG PO TABS: 50.0000 ug | ORAL_TABLET | Freq: Every day | ORAL | Status: DC

## 2012-05-13 NOTE — Patient Instructions (Signed)
Xray of right arm today (elbow)  Avoid heavy lifting and use warm compress Aleve is ok with food it helps  We will update you tomorrow with a result

## 2012-05-13 NOTE — Progress Notes (Signed)
Subjective:    Patient ID: [name] [name], female    DOB: [birth year], 56 y.o.   MRN: [id number]  HPI Pt had a blood draw in R arm -- vein on the top of her arm   ([facility])  Has hurt ever since  Now is having problems bending arm and lifting things  Worse and worse / even hurts to brush her teeth  Is R handed   Is trying to exercise  Is a throbbing pain   Knew when they drew it that something was wrong  Did get swelling and bruised Is still a bit swollen   Aleve seems to help it some  No fever or signs of infection  Patient Active Problem List  Diagnoses  . ONYCHOMYCOSIS  . NEOPLASM OF UNCERTAIN BEHAVIOR OF SKIN  . HYPOTHYROIDISM  . HYPERCHOLESTEROLEMIA, PURE  . OBESITY  . BIPOLAR DISORDER UNSPECIFIED  . ALLERGIC RHINITIS  . GERD  . IBS  . ARTHRITIS, RIGHT HIP  . DEGENERATIVE DISC DISEASE, LUMBAR SPINE  . METATARSALGIA  . POSTMENOPAUSAL STATUS  . CHEST PAIN  . Bartholin gland cyst  . Tinea corporis  . Back pain  . Routine general medical examination at a health care facility  . Other screening mammogram  . ETD (eustachian tube dysfunction)  . Cerumen impaction, right  . Right arm pain   Past Medical History  Diagnosis Date  . Diverticulosis of colon   . Hyperlipidemia   . Thyroid disease   . Liver cyst   . Allergy   . GERD (gastroesophageal reflux disease)   . IBS (irritable bowel syndrome)   . Bipolar 1 disorder   . Obesity   . Basal cell carcinoma   . Squamous cell carcinoma    Past Surgical History  Procedure Date  . Cardiovascular stress test 02/23/11  . Cardiac catheterization 04/1999  . Cataract extraction 09/2002  . Cholecystectomy   . Colonoscopy 11/2002   History  Substance Use Topics  . Smoking status: Never Smoker   . Smokeless tobacco: Not on file  . Alcohol Use: No   Family History  Problem Relation Age of Onset  . Stroke Mother   . Heart disease Mother   . Hypertension Father   . Diabetes Father   . Hyperlipidemia Father      Allergies  Allergen Reactions  . Caine-1 (Lidocaine) Swelling    REACTION: swelling ALL CAINE DRUGS EXCEPT: marcaine & sensercaine  . Carbamazepine     REACTION: skin crawls  . Codeine     REACTION: vomits blood  . Hydrocod Polst-Cpm Polst Er Other (See Comments)    To strong to take along with her other medication  . Lamotrigine Other (See Comments)    REACTION: sores in mouth, muscle aches and also bipolar made her manic  . Lidocaine Hcl Swelling  . Lithium Swelling   Current Outpatient Prescriptions on File Prior to Visit  Medication Sig Dispense Refill  . ARIPiprazole (ABILIFY) 30 MG tablet Take 30 mg by mouth daily.        Marland Kitchen aspirin 81 MG tablet Take 81 mg by mouth daily.        Marland Kitchen buPROPion (WELLBUTRIN XL) 150 MG 24 hr tablet Take two tablets by mouth every morning       . Calcium Citrate-Vitamin D 315-250 MG-UNIT TABS Take 1 tablet by mouth daily.       . cetirizine (ZYRTEC) 10 MG tablet Take 10 mg by mouth daily.        Marland Kitchen  Cholecalciferol (VITAMIN D) 2000 UNITS CAPS Take 1 capsule by mouth daily.        . clonazePAM (KLONOPIN) 1 MG tablet Take 1 mg by mouth at bedtime as needed.        . lovastatin (MEVACOR) 40 MG tablet Take 1 tablet (40 mg total) by mouth daily.  90 tablet  3  . Multiple Vitamin (MULTIVITAMIN) tablet Take 1 tablet by mouth daily.        . naproxen sodium (ALEVE) 220 MG tablet Take 220 mg by mouth 2 (two) times daily with a meal.       . olanzapine-fluoxetine (SYMBYAX) 3-25 MG per capsule Take 1 capsule by mouth at bedtime.        . Omega-3 Fatty Acids (FISH OIL) 1000 MG CAPS Take 1 capsule by mouth daily.        Marland Kitchen zolpidem (AMBIEN) 10 MG tablet Take 10 mg by mouth daily.        Marland Kitchen DISCONTD: fluticasone (FLONASE) 50 MCG/ACT nasal spray Place 2 sprays into the nose daily.  16 g  6  . DISCONTD: lansoprazole (PREVACID) 30 MG capsule Take 1 capsule (30 mg total) by mouth daily.  90 capsule  3  . DISCONTD: levothyroxine (SYNTHROID, LEVOTHROID) 50 MCG tablet Take  1 tablet (50 mcg total) by mouth daily.  90 tablet  3    Review of Systems Review of Systems  Constitutional: Negative for fever, appetite change, fatigue and unexpected weight change.  Eyes: Negative for pain and visual disturbance.  Respiratory: Negative for cough and shortness of breath.   Cardiovascular: Negative for cp or palpitations    Gastrointestinal: Negative for nausea, diarrhea and constipation.  Genitourinary: Negative for urgency and frequency.  Skin: Negative for pallor or rash  MSK pos for pain in R arm and elbow//neg for other joint changes or swelling   Neurological: Negative for weakness, light-headedness, numbness and headaches.  Hematological: Negative for adenopathy. Does not bruise/bleed easily.  Psychiatric/Behavioral: Negative for dysphoric mood. The patient is not nervous/anxious.         Objective:   Physical Exam  Constitutional: She appears well-developed and well-nourished. No distress.       Obese and well appearing   Eyes: Conjunctivae and EOM are normal. Pupils are equal, round, and reactive to light.  Neck: Normal range of motion. Neck supple.  Cardiovascular: Normal rate and regular rhythm.   Pulmonary/Chest: Effort normal and breath sounds normal.  Musculoskeletal: She exhibits tenderness. She exhibits no edema.       R arm  Tender laterally- over lateral epicondyle and spot where blood was drawn No swelling or bruising appreciated  No erythema or warmth  Pain to flex- cannot fully flex elbow Pain on forearm pronation  No crepitice Nl shoulder and hand rom and grip  Lymphadenopathy:    She has no cervical adenopathy.  Neurological: She is alert. She has normal reflexes. She displays no atrophy and no tremor. No sensory deficit. She exhibits normal muscle tone. Coordination normal.  Skin: Skin is warm and dry. No rash noted. No erythema. No pallor.  Psychiatric: She has a normal mood and affect.          Assessment & Plan:

## 2012-05-13 NOTE — Assessment & Plan Note (Addendum)
After a traumatic blood draw at [facility] in April  Although pt states her pain started then - epicondylitis is also possible  Limited rom due to pain  Also does lift wts regularly Is tender over lateral epicondyle - and that is roughly the area of the draw Adv warm compress/ nsaid as needed and refrain from wt lifting Xray today Then will adv further

## 2012-05-21 ENCOUNTER — Ambulatory Visit (INDEPENDENT_AMBULATORY_CARE_PROVIDER_SITE_OTHER): Payer: 59 | Admitting: Family Medicine

## 2012-05-21 ENCOUNTER — Encounter: Payer: Self-pay | Admitting: Family Medicine

## 2012-05-21 VITALS — BP 120/62 | HR 82 | Temp 97.7°F | Ht 61.75 in | Wt 232.2 lb

## 2012-05-21 DIAGNOSIS — M7710 Lateral epicondylitis, unspecified elbow: Secondary | ICD-10-CM | POA: Diagnosis not present

## 2012-05-21 MED ORDER — DICLOFENAC SODIUM 75 MG PO TBEC: 75.0000 mg | DELAYED_RELEASE_TABLET | Freq: Two times a day (BID) | ORAL | Status: DC

## 2012-05-21 NOTE — Progress Notes (Signed)
   Patient presents with lateral elbow pain.  Length of symptoms: 2 mo Hand effected: R  Patient describes a dull ache on the lateral elbow. There is some translation in the proximal forearm and in the distal upper arm. It is painful to lift with the hand facing down and to lift with the thumb in an upright position. Supination is painful. Patient points to the lateral epicondyle as the point of maximal tenderness near ECRB.  No trauma.   No prior fractures or operative interventions in the effective hand. Prior PT or HEP: none  Denies numbness or tingling. No significant neck or shoulder pain.  Hand of dominance: R  REVIEW OF SYSTEMS  GEN: No fevers, chills. Nontoxic. Primarily MSK c/o today. MSK: Detailed in the HPI GI: tolerating PO intake without difficulty Neuro: No numbness, parasthesias, or tingling associated. Otherwise the pertinent positives of the ROS are noted above.   PHYSICAL EXAM  Blood pressure 120/62, pulse 82, temperature 97.7 F (36.5 C), temperature source Oral, height 5' 1.75" (1.568 m), weight 232 lb 4 oz (105.348 kg), SpO2 97.00%.  GEN: Well-developed,well-nourished,in no acute distress; alert,appropriate and cooperative throughout examination HEENT: Normocephalic and atraumatic without obvious abnormalities. Ears, externally no deformities PULM: Breathing comfortably in no respiratory distress EXT: No clubbing, cyanosis, or edema PSYCH: Normally interactive. Cooperative during the interview. Pleasant. Friendly and conversant. Not anxious or depressed appearing. Normal, full affect.  R elbow Ecchymosis or edema: neg ROM: full flexion, extension, pronation, supination Shoulder ROM: Full Flexion: 5/5 Extension: 5/5, PAINFUL Supination: 5/5, PAINFUL Pronation: 5/5 Wrist ext: 5/5 Wrist flexion: 5/5 No gross bony abnormality Varus and Valgus stress: stable ECRB tenderness: YES, TTP Medial epicondyle: NT Lateral epicondyle, resisted wrist extension  from wrist full pronation and flexion: PAINFUL grip: 5/5  sensation intact Tinel's, Elbow: negative  A/P: Lateral Epicondylitis: Elbow anatomy was reviewed, and tendinopathy was explained.  Pt. given a formal rehab program from Dr. Caralee Ates at [facility] and the [facility] on elbow rehabiliation.  Series of concentric and eccentric exercises should be done starting with no weight, work up to 1 lb, hammer, etc.  Use counterforce strap if working or using hands.  Formal PT would be beneficial. Emphasized stretching an cross-friction massage Emphasized proper palms up lifting biomechanics to unload ECRB

## 2012-06-17 DIAGNOSIS — F314 Bipolar disorder, current episode depressed, severe, without psychotic features: Secondary | ICD-10-CM | POA: Diagnosis not present

## 2012-06-18 ENCOUNTER — Other Ambulatory Visit: Payer: Self-pay | Admitting: *Deleted

## 2012-06-18 NOTE — Telephone Encounter (Signed)
She was given 3 refils on this in June and I do not think it is supposed to be a long term med - so do not think it would be a candidate for mail in (?)

## 2012-07-21 DIAGNOSIS — T7840XA Allergy, unspecified, initial encounter: Secondary | ICD-10-CM | POA: Diagnosis not present

## 2012-07-28 DIAGNOSIS — F314 Bipolar disorder, current episode depressed, severe, without psychotic features: Secondary | ICD-10-CM | POA: Diagnosis not present

## 2012-08-28 DIAGNOSIS — F314 Bipolar disorder, current episode depressed, severe, without psychotic features: Secondary | ICD-10-CM | POA: Diagnosis not present

## 2012-09-12 ENCOUNTER — Ambulatory Visit: Payer: 59 | Admitting: Family Medicine

## 2012-09-16 ENCOUNTER — Ambulatory Visit (INDEPENDENT_AMBULATORY_CARE_PROVIDER_SITE_OTHER): Payer: 59 | Admitting: Family Medicine

## 2012-09-16 ENCOUNTER — Encounter: Payer: Self-pay | Admitting: Family Medicine

## 2012-09-16 VITALS — BP 124/78 | HR 82 | Temp 97.9°F | Ht 60.75 in | Wt 234.0 lb

## 2012-09-16 DIAGNOSIS — Z0289 Encounter for other administrative examinations: Secondary | ICD-10-CM

## 2012-09-16 DIAGNOSIS — E7800 Pure hypercholesterolemia, unspecified: Secondary | ICD-10-CM

## 2012-09-16 DIAGNOSIS — Z0000 Encounter for general adult medical examination without abnormal findings: Secondary | ICD-10-CM

## 2012-09-16 DIAGNOSIS — E039 Hypothyroidism, unspecified: Secondary | ICD-10-CM

## 2012-09-16 DIAGNOSIS — Z23 Encounter for immunization: Secondary | ICD-10-CM

## 2012-09-16 DIAGNOSIS — Z1231 Encounter for screening mammogram for malignant neoplasm of breast: Secondary | ICD-10-CM

## 2012-09-16 DIAGNOSIS — E669 Obesity, unspecified: Secondary | ICD-10-CM

## 2012-09-16 DIAGNOSIS — Z026 Encounter for examination for insurance purposes: Secondary | ICD-10-CM | POA: Insufficient documentation

## 2012-09-16 NOTE — Assessment & Plan Note (Signed)
Reviewed health habits including diet and exercise and skin cancer prevention Also reviewed health mt list, fam hx and immunizations  Wellness labs today 

## 2012-09-16 NOTE — Assessment & Plan Note (Signed)
Scheduled annual screening mammogram Nl breast exam today  Encouraged monthly self exams   

## 2012-09-16 NOTE — Patient Instructions (Addendum)
We will refer you for a mammogram at check out [facility] today Flu shot today Good job with exercise ! -keep it up

## 2012-09-16 NOTE — Assessment & Plan Note (Signed)
tsh today No clinical changes  

## 2012-09-16 NOTE — Progress Notes (Signed)
Subjective:    Patient ID: [name] [name], female    DOB: 1956/10/27, 56 y.o.   MRN: [id number]  HPI Here for health maintenance exam and to review chronic medical problems   Will also need an insurance form filled out - but forgot it   Stress- husband was in a bad motor accident - broken bones -- is slowly getting better  Is doing ok with the stress- getting by   Is eating a healthy diet - and exercising  1.5 miles on treadmill daily - sweating and getting heart rate up -- feels better doing that  Needs time for herself  Needs waist circ- 42 inches Wt is up 2 lb with bmi of 44 124/78 bp   Will need wellness labs and also nicotine test  mammo 12/12-wants to schedule that at [facility]  Self exam-no lumps or changes  Pap 9/12 No gyn problems No hx of abn paps   Flu shot today- happy about that   Mood has been really good  Exercise really helps   Feels like her thyroid is stable   Patient Active Problem List  Diagnosis  . ONYCHOMYCOSIS  . NEOPLASM OF UNCERTAIN BEHAVIOR OF SKIN  . HYPOTHYROIDISM  . HYPERCHOLESTEROLEMIA, PURE  . OBESITY  . BIPOLAR DISORDER UNSPECIFIED  . ALLERGIC RHINITIS  . GERD  . IBS  . ARTHRITIS, RIGHT HIP  . DEGENERATIVE DISC DISEASE, LUMBAR SPINE  . METATARSALGIA  . POSTMENOPAUSAL STATUS  . CHEST PAIN  . Bartholin gland cyst  . Tinea corporis  . Back pain  . Routine general medical examination at a health care facility  . Other screening mammogram  . ETD (eustachian tube dysfunction)  . Cerumen impaction, right  . Right arm pain   Past Medical History  Diagnosis Date  . Diverticulosis of colon   . Hyperlipidemia   . Thyroid disease   . Liver cyst   . Allergy   . GERD (gastroesophageal reflux disease)   . IBS (irritable bowel syndrome)   . Bipolar 1 disorder   . Obesity   . Basal cell carcinoma   . Squamous cell carcinoma    Past Surgical History  Procedure Date  . Cardiovascular stress test 02/23/11  . Cardiac catheterization  04/1999  . Cataract extraction 09/2002  . Cholecystectomy   . Colonoscopy 11/2002   History  Substance Use Topics  . Smoking status: Never Smoker   . Smokeless tobacco: Not on file  . Alcohol Use: No   Family History  Problem Relation Age of Onset  . Stroke Mother   . Heart disease Mother   . Hypertension Father   . Diabetes Father   . Hyperlipidemia Father    Allergies  Allergen Reactions  . Caine-1 (Lidocaine) Swelling    REACTION: swelling ALL CAINE DRUGS EXCEPT: marcaine & sensercaine  . Carbamazepine     REACTION: skin crawls  . Codeine     REACTION: vomits blood  . Hydrocod Polst-Cpm Polst Er Other (See Comments)    To strong to take along with her other medication  . Lamotrigine Other (See Comments)    REACTION: sores in mouth, muscle aches and also bipolar made her manic  . Lidocaine Hcl Swelling  . Lithium Swelling   Current Outpatient Prescriptions on File Prior to Visit  Medication Sig Dispense Refill  . ARIPiprazole (ABILIFY) 30 MG tablet Take 30 mg by mouth daily.        Marland Kitchen aspirin 81 MG tablet Take 81 mg by  mouth daily.        Marland Kitchen buPROPion (WELLBUTRIN XL) 150 MG 24 hr tablet Take two tablets by mouth every morning       . Calcium Citrate-Vitamin D 315-250 MG-UNIT TABS Take 1 tablet by mouth daily.       . cetirizine (ZYRTEC) 10 MG tablet Take 10 mg by mouth daily.        . Cholecalciferol (VITAMIN D) 2000 UNITS CAPS Take 1 capsule by mouth daily.        . clonazePAM (KLONOPIN) 1 MG tablet Take 1 mg by mouth at bedtime as needed.        . fluticasone (FLONASE) 50 MCG/ACT nasal spray Place 2 sprays into the nose daily.  48 g  3  . lansoprazole (PREVACID) 30 MG capsule Take 1 capsule (30 mg total) by mouth daily.  90 capsule  3  . levothyroxine (SYNTHROID, LEVOTHROID) 50 MCG tablet Take 1 tablet (50 mcg total) by mouth daily.  90 tablet  3  . lovastatin (MEVACOR) 40 MG tablet Take 1 tablet (40 mg total) by mouth daily.  90 tablet  3  . Multiple Vitamin  (MULTIVITAMIN) tablet Take 1 tablet by mouth daily.        . naproxen sodium (ALEVE) 220 MG tablet Take 220 mg by mouth 2 (two) times daily with a meal.       . olanzapine-fluoxetine (SYMBYAX) 3-25 MG per capsule Take 1 capsule by mouth at bedtime.        . Omega-3 Fatty Acids (FISH OIL) 1000 MG CAPS Take 1 capsule by mouth daily.        Marland Kitchen zolpidem (AMBIEN) 10 MG tablet Take 10 mg by mouth daily.             Review of Systems Review of Systems  Constitutional: Negative for fever, appetite change, fatigue and unexpected weight change.  Eyes: Negative for pain and visual disturbance.  Respiratory: Negative for cough and shortness of breath.   Cardiovascular: Negative for cp or palpitations    Gastrointestinal: Negative for nausea, diarrhea and constipation.  Genitourinary: Negative for urgency and frequency.  Skin: Negative for pallor or rash   Neurological: Negative for weakness, light-headedness, numbness and headaches.  Hematological: Negative for adenopathy. Does not bruise/bleed easily.  Psychiatric/Behavioral: Negative for dysphoric mood. The patient is not nervous/anxious.  pos for much stress lately       Objective:   Physical Exam  Constitutional: She appears well-developed and well-nourished. No distress.       obese and well appearing   HENT:  Head: Normocephalic and atraumatic.  Right Ear: External ear normal.  Left Ear: External ear normal.  Nose: Nose normal.  Mouth/Throat: Oropharynx is clear and moist. No oropharyngeal exudate.  Eyes: Conjunctivae normal and EOM are normal. Pupils are equal, round, and reactive to light. Right eye exhibits no discharge. Left eye exhibits no discharge. No scleral icterus.  Neck: Normal range of motion. Neck supple. No JVD present. Carotid bruit is not present. No thyromegaly present.  Cardiovascular: Normal rate, regular rhythm, normal heart sounds and intact distal pulses.  Exam reveals no gallop.   No murmur  heard. Pulmonary/Chest: Effort normal and breath sounds normal. No respiratory distress. She has no wheezes.  Abdominal: Soft. Bowel sounds are normal. She exhibits no distension, no abdominal bruit and no mass. There is no tenderness.  Genitourinary: No breast swelling, tenderness, discharge or bleeding.       Breast exam: No mass, nodules,  thickening, tenderness, bulging, retraction, inflamation, nipple discharge or skin changes noted.  No axillary or clavicular LA.  Chaperoned exam.    Musculoskeletal: Normal range of motion. She exhibits no edema and no tenderness.  Lymphadenopathy:    She has no cervical adenopathy.  Neurological: She is alert. She has normal reflexes. No cranial nerve deficit. She exhibits normal muscle tone. Coordination normal.  Skin: Skin is warm and dry. No rash noted. No erythema. No pallor.       Solar lentigos diffusely   Psychiatric: She has a normal mood and affect.          Assessment & Plan:

## 2012-09-16 NOTE — Assessment & Plan Note (Signed)
Lab today  Rev low sat fat diet  No problems with her mevacor

## 2012-09-16 NOTE — Assessment & Plan Note (Signed)
Discussed how this problem influences overall health and the risks it imposes  Reviewed plan for weight loss with lower calorie diet (via better food choices and also portion control or program like weight watchers) and exercise building up to or more than 30 minutes 5 days per week including some aerobic activity   Commended on exercise ! 

## 2012-09-16 NOTE — Assessment & Plan Note (Signed)
Added nicotine test to labs- required by her job for insurance

## 2012-09-17 ENCOUNTER — Other Ambulatory Visit (INDEPENDENT_AMBULATORY_CARE_PROVIDER_SITE_OTHER): Payer: 59

## 2012-09-17 ENCOUNTER — Telehealth: Payer: Self-pay | Admitting: Family Medicine

## 2012-09-17 DIAGNOSIS — Z0289 Encounter for other administrative examinations: Secondary | ICD-10-CM

## 2012-09-17 DIAGNOSIS — Z026 Encounter for examination for insurance purposes: Secondary | ICD-10-CM

## 2012-09-17 NOTE — Telephone Encounter (Signed)
Needs a1c for her insurance

## 2012-09-18 LAB — HEMOGLOBIN A1C: Est. average glucose Bld gHb Est-mCnc: 108 mg/dL

## 2012-09-23 LAB — COMPREHENSIVE METABOLIC PANEL
ALT: 20 IU/L (ref 0–32)
Albumin/Globulin Ratio: 2.1 (ref 1.1–2.5)
Albumin: 4.4 g/dL (ref 3.5–5.5)
Calcium: 9.7 mg/dL (ref 8.7–10.2)
Creatinine, Ser: 1.19 mg/dL — ABNORMAL HIGH (ref 0.57–1.00)
GFR calc Af Amer: 60 mL/min/{1.73_m2} (ref >59)
GFR calc non Af Amer: 52 mL/min/{1.73_m2} — ABNORMAL LOW (ref >59)
Globulin, Total: 2.1 g/dL (ref 1.5–4.5)
Glucose: 105 mg/dL — ABNORMAL HIGH (ref 65–99)
Potassium: 4.7 mmol/L (ref 3.5–5.2)
Total Bilirubin: 0.4 mg/dL (ref 0.0–1.2)
Total Protein: 6.5 g/dL (ref 6.0–8.5)

## 2012-09-23 LAB — LIPID PANEL
Chol/HDL Ratio: 2.8 ratio units (ref 0.0–4.4)
HDL: 61 mg/dL (ref >39)
LDL Calculated: 85 mg/dL (ref 0–99)
VLDL Cholesterol Cal: 25 mg/dL (ref 5–40)

## 2012-09-23 LAB — CBC WITH DIFFERENTIAL
Basos: 0 % (ref 0–3)
Eos: 0 % (ref 0–5)
Eosinophils Absolute: 0 10*3/uL (ref 0.0–0.4)
HCT: 39.4 % (ref 34.0–46.6)
Hemoglobin: 13.2 g/dL (ref 11.1–15.9)
Lymphs: 28 % (ref 14–46)
MCH: 29.3 pg (ref 26.6–33.0)
MCHC: 33.5 g/dL (ref 31.5–35.7)
Neutrophils Absolute: 3 10*3/uL (ref 1.4–7.0)
RBC: 4.5 x10E6/uL (ref 3.77–5.28)

## 2012-09-23 LAB — NICOTINE/COTININE METABOLITES
Cotinine: NOT DETECTED ng/mL
Nicotine: NOT DETECTED ng/mL

## 2012-09-24 ENCOUNTER — Encounter: Payer: Self-pay | Admitting: *Deleted

## 2012-09-25 DIAGNOSIS — F314 Bipolar disorder, current episode depressed, severe, without psychotic features: Secondary | ICD-10-CM | POA: Diagnosis not present

## 2012-10-20 ENCOUNTER — Other Ambulatory Visit: Payer: Self-pay

## 2012-10-20 MED ORDER — LOVASTATIN 40 MG PO TABS: 40.0000 mg | ORAL_TABLET | Freq: Every day | ORAL | Status: DC

## 2012-10-20 NOTE — Telephone Encounter (Signed)
[facility] faxed refill lovastatin # 90 x 1 to [facility].

## 2012-10-27 DIAGNOSIS — F314 Bipolar disorder, current episode depressed, severe, without psychotic features: Secondary | ICD-10-CM | POA: Diagnosis not present

## 2012-12-01 ENCOUNTER — Ambulatory Visit (INDEPENDENT_AMBULATORY_CARE_PROVIDER_SITE_OTHER): Payer: 59 | Admitting: Family Medicine

## 2012-12-01 ENCOUNTER — Encounter: Payer: Self-pay | Admitting: Family Medicine

## 2012-12-01 VITALS — BP 114/68 | HR 82 | Temp 98.3°F | Ht 60.75 in | Wt 236.2 lb

## 2012-12-01 DIAGNOSIS — R05 Cough: Secondary | ICD-10-CM

## 2012-12-01 DIAGNOSIS — R0989 Other specified symptoms and signs involving the circulatory and respiratory systems: Secondary | ICD-10-CM

## 2012-12-01 DIAGNOSIS — J069 Acute upper respiratory infection, unspecified: Secondary | ICD-10-CM

## 2012-12-01 DIAGNOSIS — R059 Cough, unspecified: Secondary | ICD-10-CM | POA: Diagnosis not present

## 2012-12-01 MED ORDER — AMOXICILLIN-POT CLAVULANATE 875-125 MG PO TABS: 1.0000 | ORAL_TABLET | Freq: Two times a day (BID) | ORAL | Status: DC

## 2012-12-01 NOTE — Patient Instructions (Addendum)
I think you have a viral upper respiratory infection with cough  Drink lots of fluids and get extra rest  You are contagious - so avoid contact with elderly/ sick folks I recommend mucinex DM for cough and congestion / aleve sinus is ok for sinus symptoms  If your sinus pain worsens , you develop a fever and green nasal discharge - this could be a sign of a bacterial sinus infection- in that case - fill the px for augmentin and take it as directed  Update if not starting to improve in a week or if worsening

## 2012-12-01 NOTE — Assessment & Plan Note (Signed)
With negative flu test today  Disc symptomatic care - see instructions on AVS  Update if not starting to improve in a week or if worsening   In light of sinus pain - gave px for augmentin in case her symptoms worsen over the holiday- disc s/s of bacterial sinusitis  Update if not starting to improve in a week or if worsening

## 2012-12-01 NOTE — Progress Notes (Signed)
Subjective:    Patient ID: [name] [name], female    DOB: 19-Feb-1956, 56 y.o.   MRN: [id number]  HPI Here with uri symptoms Had a flu vaccine this year   This started as a deep chest cough - on Saturday Dark yellow sputum and doing some wheezing  Thought she had a fever- flushed and hot and body aches- but no chills  Nose is stuffy and she is sniffling  Ears feel like they are underwater Throat is scratchy-not painful  Is taking aleve cold and sinus med Helps body aches and congestion   She is supposed to go home to see her 70 year old father    Patient Active Problem List  Diagnosis  . ONYCHOMYCOSIS  . NEOPLASM OF UNCERTAIN BEHAVIOR OF SKIN  . HYPOTHYROIDISM  . HYPERCHOLESTEROLEMIA, PURE  . OBESITY  . BIPOLAR DISORDER UNSPECIFIED  . ALLERGIC RHINITIS  . GERD  . IBS  . ARTHRITIS, RIGHT HIP  . DEGENERATIVE DISC DISEASE, LUMBAR SPINE  . METATARSALGIA  . POSTMENOPAUSAL STATUS  . CHEST PAIN  . Bartholin gland cyst  . Tinea corporis  . Back pain  . Routine general medical examination at a health care facility  . Other screening mammogram  . ETD (eustachian tube dysfunction)  . Cerumen impaction, right  . Right arm pain  . Encounter for examination for insurance purposes   Past Medical History  Diagnosis Date  . Diverticulosis of colon   . Hyperlipidemia   . Thyroid disease   . Liver cyst   . Allergy   . GERD (gastroesophageal reflux disease)   . IBS (irritable bowel syndrome)   . Bipolar 1 disorder   . Obesity   . Basal cell carcinoma   . Squamous cell carcinoma    Past Surgical History  Procedure Date  . Cardiovascular stress test 02/23/11  . Cardiac catheterization 04/1999  . Cataract extraction 09/2002  . Cholecystectomy   . Colonoscopy 11/2002   History  Substance Use Topics  . Smoking status: Never Smoker   . Smokeless tobacco: Not on file  . Alcohol Use: No   Family History  Problem Relation Age of Onset  . Stroke Mother   . Heart  disease Mother   . Hypertension Father   . Diabetes Father   . Hyperlipidemia Father    Allergies  Allergen Reactions  . Caine-1 (Lidocaine) Swelling    REACTION: swelling ALL CAINE DRUGS EXCEPT: marcaine & sensercaine  . Carbamazepine     REACTION: skin crawls  . Codeine     REACTION: vomits blood  . Hydrocod Polst-Cpm Polst Er Other (See Comments)    To strong to take along with her other medication  . Lamotrigine Other (See Comments)    REACTION: sores in mouth, muscle aches and also bipolar made her manic  . Lidocaine Hcl Swelling  . Lithium Swelling   Current Outpatient Prescriptions on File Prior to Visit  Medication Sig Dispense Refill  . ARIPiprazole (ABILIFY) 30 MG tablet Take 30 mg by mouth daily.        Marland Kitchen aspirin 81 MG tablet Take 81 mg by mouth daily.        Marland Kitchen buPROPion (WELLBUTRIN XL) 150 MG 24 hr tablet Take two tablets by mouth every morning       . Calcium Citrate-Vitamin D 315-250 MG-UNIT TABS Take 1 tablet by mouth daily.       . cetirizine (ZYRTEC) 10 MG tablet Take 10 mg by mouth daily.        Marland Kitchen  Cholecalciferol (VITAMIN D) 2000 UNITS CAPS Take 1 capsule by mouth daily.        . clonazePAM (KLONOPIN) 1 MG tablet Take 1 mg by mouth at bedtime as needed.        . fluticasone (FLONASE) 50 MCG/ACT nasal spray Place 2 sprays into the nose daily.  48 g  3  . lansoprazole (PREVACID) 30 MG capsule Take 1 capsule (30 mg total) by mouth daily.  90 capsule  3  . levothyroxine (SYNTHROID, LEVOTHROID) 50 MCG tablet Take 1 tablet (50 mcg total) by mouth daily.  90 tablet  3  . lovastatin (MEVACOR) 40 MG tablet Take 1 tablet (40 mg total) by mouth daily.  90 tablet  1  . Multiple Vitamin (MULTIVITAMIN) tablet Take 1 tablet by mouth daily.        . naproxen sodium (ALEVE) 220 MG tablet Take 220 mg by mouth 2 (two) times daily with a meal.       . olanzapine-fluoxetine (SYMBYAX) 3-25 MG per capsule Take 1 capsule by mouth at bedtime.        . Omega-3 Fatty Acids (FISH OIL) 1000  MG CAPS Take 1 capsule by mouth daily.        Marland Kitchen zolpidem (AMBIEN) 10 MG tablet Take 10 mg by mouth daily.          Review of Systems Review of Systems  Constitutional: pos for body aches/ fatigue and malaise.  Eyes: Negative for pain and visual disturbance.  ENT pos for congestion and post nasal drip , neg for ear pain  Respiratory: Negative for wheeze and shortness of breath.   Cardiovascular: Negative for cp or palpitations    Gastrointestinal: Negative for nausea, diarrhea and constipation.  Genitourinary: Negative for urgency and frequency.  Skin: Negative for pallor or rash   Neurological: Negative for weakness, light-headedness, numbness and headaches.  Hematological: Negative for adenopathy. Does not bruise/bleed easily.  Psychiatric/Behavioral: Negative for dysphoric mood. The patient is not nervous/anxious.         Objective:   Physical Exam  Constitutional: She appears well-developed and well-nourished. No distress.       overwt and fatigued appearing   HENT:  Head: Normocephalic and atraumatic.  Mouth/Throat: Oropharynx is clear and moist.       Nares are injected and congested  bilat maxillary and frontal sinus tenderness Throat- post clear drainage TMs-effusions bilat- no erythema   Eyes: Conjunctivae normal and EOM are normal. Pupils are equal, round, and reactive to light. Right eye exhibits no discharge. Left eye exhibits no discharge.  Neck: Normal range of motion. Neck supple.  Cardiovascular: Normal rate and regular rhythm.   Pulmonary/Chest: Effort normal and breath sounds normal. No respiratory distress. She has no wheezes. She has no rales.       Few scattered rhonchi Good air exch  Lymphadenopathy:    She has no cervical adenopathy.  Neurological: She is alert.  Skin: Skin is warm and dry. No rash noted.  Psychiatric: She has a normal mood and affect.          Assessment & Plan:

## 2012-12-05 ENCOUNTER — Ambulatory Visit: Payer: Self-pay | Admitting: Family Medicine

## 2012-12-05 ENCOUNTER — Encounter: Payer: Self-pay | Admitting: Family Medicine

## 2012-12-08 ENCOUNTER — Encounter: Payer: Self-pay | Admitting: *Deleted

## 2013-01-09 ENCOUNTER — Encounter: Payer: Self-pay | Admitting: Family Medicine

## 2013-01-09 ENCOUNTER — Ambulatory Visit (INDEPENDENT_AMBULATORY_CARE_PROVIDER_SITE_OTHER): Payer: 59 | Admitting: Family Medicine

## 2013-01-09 VITALS — BP 130/68 | HR 76 | Temp 97.6°F | Ht 60.75 in | Wt 240.2 lb

## 2013-01-09 DIAGNOSIS — M545 Low back pain: Secondary | ICD-10-CM | POA: Diagnosis not present

## 2013-01-09 LAB — POCT URINALYSIS DIPSTICK
Glucose, UA: NEGATIVE
Ketones, UA: NEGATIVE
Spec Grav, UA: >=1.03
Urobilinogen, UA: 0.2

## 2013-01-09 MED ORDER — CYCLOBENZAPRINE HCL 10 MG PO TABS: 10.0000 mg | ORAL_TABLET | Freq: Three times a day (TID) | ORAL | Status: DC | PRN

## 2013-01-09 NOTE — Assessment & Plan Note (Addendum)
Recurrent L sided with no neurol s/s Pos hx for deg spine dz  tx with aleve prn and flexeril  Heat as needed Stretches and walking  Eventually needs to work on abd strength Update if not starting to improve in a week or if worsening    Aware- ua was pos on dip/ neg micro - suspect contaminated , and pt has no urinary symptoms

## 2013-01-09 NOTE — Progress Notes (Signed)
Subjective:    Patient ID: [name] [name], female    DOB: 05-Mar-1956, 57 y.o.   MRN: [id number]  HPI Here for back pain - thinks she "pulled her back" Several days low back pain  Hurts to bend forward  (she was unable to bowl and first noticed when she was walking her dog)  Has had LS back problems in past - deg disk/ djd  Hurting worse on the L -not at all on the right  No urinary symptoms  No rad to leg  No numbness or weakness No loss of continence   Patient Active Problem List  Diagnosis  . ONYCHOMYCOSIS  . NEOPLASM OF UNCERTAIN BEHAVIOR OF SKIN  . HYPOTHYROIDISM  . HYPERCHOLESTEROLEMIA, PURE  . OBESITY  . BIPOLAR DISORDER UNSPECIFIED  . ALLERGIC RHINITIS  . GERD  . IBS  . ARTHRITIS, RIGHT HIP  . DEGENERATIVE DISC DISEASE, LUMBAR SPINE  . METATARSALGIA  . POSTMENOPAUSAL STATUS  . CHEST PAIN  . Bartholin gland cyst  . Tinea corporis  . Back pain  . Routine general medical examination at a health care facility  . Other screening mammogram  . ETD (eustachian tube dysfunction)  . Cerumen impaction, right  . Right arm pain  . Encounter for examination for insurance purposes   Past Medical History  Diagnosis Date  . Diverticulosis of colon   . Hyperlipidemia   . Thyroid disease   . Liver cyst   . Allergy   . GERD (gastroesophageal reflux disease)   . IBS (irritable bowel syndrome)   . Bipolar 1 disorder   . Obesity   . Basal cell carcinoma   . Squamous cell carcinoma    Past Surgical History  Procedure Date  . Cardiovascular stress test 02/23/11  . Cardiac catheterization 04/1999  . Cataract extraction 09/2002  . Cholecystectomy   . Colonoscopy 11/2002   History  Substance Use Topics  . Smoking status: Never Smoker   . Smokeless tobacco: Not on file  . Alcohol Use: No   Family History  Problem Relation Age of Onset  . Stroke Mother   . Heart disease Mother   . Hypertension Father   . Diabetes Father   . Hyperlipidemia Father    Allergies   Allergen Reactions  . Caine-1 (Lidocaine) Swelling    REACTION: swelling ALL CAINE DRUGS EXCEPT: marcaine & sensercaine  . Carbamazepine     REACTION: skin crawls  . Codeine     REACTION: vomits blood  . Hydrocod Polst-Cpm Polst Er Other (See Comments)    To strong to take along with her other medication  . Lamotrigine Other (See Comments)    REACTION: sores in mouth, muscle aches and also bipolar made her manic  . Lidocaine Hcl Swelling  . Lithium Swelling   Current Outpatient Prescriptions on File Prior to Visit  Medication Sig Dispense Refill  . amoxicillin-clavulanate (AUGMENTIN) 875-125 MG per tablet Take 1 tablet by mouth 2 (two) times daily.  14 tablet  0  . ARIPiprazole (ABILIFY) 30 MG tablet Take 30 mg by mouth daily.        Marland Kitchen aspirin 81 MG tablet Take 81 mg by mouth daily.        Marland Kitchen buPROPion (WELLBUTRIN XL) 150 MG 24 hr tablet Take two tablets by mouth every morning       . Calcium Citrate-Vitamin D 315-250 MG-UNIT TABS Take 1 tablet by mouth daily.       . cetirizine (ZYRTEC) 10 MG tablet Take  10 mg by mouth daily.        . Cholecalciferol (VITAMIN D) 2000 UNITS CAPS Take 1 capsule by mouth daily.        . clonazePAM (KLONOPIN) 1 MG tablet Take 1 mg by mouth at bedtime as needed.        . fluticasone (FLONASE) 50 MCG/ACT nasal spray Place 2 sprays into the nose daily.  48 g  3  . lansoprazole (PREVACID) 30 MG capsule Take 1 capsule (30 mg total) by mouth daily.  90 capsule  3  . levothyroxine (SYNTHROID, LEVOTHROID) 50 MCG tablet Take 1 tablet (50 mcg total) by mouth daily.  90 tablet  3  . lovastatin (MEVACOR) 40 MG tablet Take 1 tablet (40 mg total) by mouth daily.  90 tablet  1  . Multiple Vitamin (MULTIVITAMIN) tablet Take 1 tablet by mouth daily.        . naproxen sodium (ALEVE) 220 MG tablet Take 220 mg by mouth 2 (two) times daily with a meal.       . olanzapine-fluoxetine (SYMBYAX) 3-25 MG per capsule Take 1 capsule by mouth at bedtime.        . Omega-3 Fatty  Acids (FISH OIL) 1000 MG CAPS Take 1 capsule by mouth daily.        Marland Kitchen zolpidem (AMBIEN) 10 MG tablet Take 10 mg by mouth daily.            Review of Systems Review of Systems  Constitutional: Negative for fever, appetite change, fatigue and unexpected weight change.  Eyes: Negative for pain and visual disturbance.  Respiratory: Negative for cough and shortness of breath.   Cardiovascular: Negative for cp or palpitations    Gastrointestinal: Negative for nausea, diarrhea and constipation.  Genitourinary: Negative for urgency and frequency.  Skin: Negative for pallor or rash   MSK pos for back pain / neg for joint swelling  Neurological: Negative for weakness, light-headedness, numbness and headaches.  Hematological: Negative for adenopathy. Does not bruise/bleed easily.  Psychiatric/Behavioral: Negative for dysphoric mood. The patient is not nervous/anxious.         Objective:   Physical Exam  Constitutional: She appears well-developed and well-nourished. No distress.       obese and well appearing   HENT:  Head: Atraumatic.  Eyes: Conjunctivae normal and EOM are normal. Pupils are equal, round, and reactive to light.  Neck: Neck supple.  Cardiovascular: Normal rate and regular rhythm.   Pulmonary/Chest: Effort normal and breath sounds normal.  Abdominal: Soft. Bowel sounds are normal.  Musculoskeletal: She exhibits tenderness. She exhibits no edema.       Lumbar back: She exhibits decreased range of motion, tenderness and spasm. She exhibits no bony tenderness, no swelling, no edema and no deformity.       Neg SLR Flex LS 30 deg and ext 10 deg with pain  Pain on L lat flex  Tender in L para lumbar musculature with spasm Gait is slow but steady  Lymphadenopathy:    She has no cervical adenopathy.  Neurological: She is alert. She has normal strength and normal reflexes. She displays no atrophy. No sensory deficit. She exhibits normal muscle tone. Coordination and gait normal.   Skin: Skin is warm and dry. No rash noted. No erythema. No pallor.  Psychiatric: She has a normal mood and affect.          Assessment & Plan:

## 2013-01-09 NOTE — Patient Instructions (Addendum)
I think you strained your back muscles Use heat for 10 minutes at a time Keep walking but avoid heavy lifting  Take aleve up to twice daily with food Flexeril as needed -watch out for sedation Update if not starting to improve in a week or if worsening

## 2013-01-19 DIAGNOSIS — F314 Bipolar disorder, current episode depressed, severe, without psychotic features: Secondary | ICD-10-CM | POA: Diagnosis not present

## 2013-03-19 DIAGNOSIS — F314 Bipolar disorder, current episode depressed, severe, without psychotic features: Secondary | ICD-10-CM | POA: Diagnosis not present

## 2013-03-20 DIAGNOSIS — R079 Chest pain, unspecified: Secondary | ICD-10-CM | POA: Diagnosis not present

## 2013-03-20 DIAGNOSIS — E782 Mixed hyperlipidemia: Secondary | ICD-10-CM | POA: Diagnosis not present

## 2013-03-20 DIAGNOSIS — I10 Essential (primary) hypertension: Secondary | ICD-10-CM | POA: Diagnosis not present

## 2013-04-23 DIAGNOSIS — J019 Acute sinusitis, unspecified: Secondary | ICD-10-CM | POA: Diagnosis not present

## 2013-04-23 DIAGNOSIS — J209 Acute bronchitis, unspecified: Secondary | ICD-10-CM | POA: Diagnosis not present

## 2013-04-23 DIAGNOSIS — J029 Acute pharyngitis, unspecified: Secondary | ICD-10-CM | POA: Diagnosis not present

## 2013-05-08 DIAGNOSIS — L821 Other seborrheic keratosis: Secondary | ICD-10-CM | POA: Diagnosis not present

## 2013-05-08 DIAGNOSIS — L819 Disorder of pigmentation, unspecified: Secondary | ICD-10-CM | POA: Diagnosis not present

## 2013-05-08 DIAGNOSIS — L719 Rosacea, unspecified: Secondary | ICD-10-CM | POA: Diagnosis not present

## 2013-05-08 DIAGNOSIS — L570 Actinic keratosis: Secondary | ICD-10-CM | POA: Diagnosis not present

## 2013-05-08 DIAGNOSIS — Z85828 Personal history of other malignant neoplasm of skin: Secondary | ICD-10-CM | POA: Diagnosis not present

## 2013-05-15 ENCOUNTER — Other Ambulatory Visit: Payer: Self-pay

## 2013-05-15 MED ORDER — LEVOTHYROXINE SODIUM 50 MCG PO TABS: 50.0000 ug | ORAL_TABLET | Freq: Every day | ORAL | Status: DC

## 2013-05-15 MED ORDER — LANSOPRAZOLE 30 MG PO CPDR: 30.0000 mg | DELAYED_RELEASE_CAPSULE | Freq: Every day | ORAL | Status: DC

## 2013-05-15 MED ORDER — LOVASTATIN 40 MG PO TABS: 40.0000 mg | ORAL_TABLET | Freq: Every day | ORAL | Status: DC

## 2013-05-15 NOTE — Telephone Encounter (Signed)
Pt left v/m requesting refills on lansoprazole,levothyroxine and lovastatin to [facility]. Left v/m advising pt refills done as requested.

## 2013-06-01 DIAGNOSIS — F314 Bipolar disorder, current episode depressed, severe, without psychotic features: Secondary | ICD-10-CM | POA: Diagnosis not present

## 2013-06-17 ENCOUNTER — Telehealth: Payer: Self-pay | Admitting: Family Medicine

## 2013-06-17 NOTE — Telephone Encounter (Signed)
Patient Information:  Caller Name: Hortensia  Phone: [phone]  Patient: [name] [name], [name] [name]  Gender: Female  DOB: 1956-05-31  Age: 57 Years  PCP: [facility] [employer] [facility])  Pregnant: No  Office Follow Up:  Does the office need to follow up with this patient?: No  Instructions For The Office: N/A   Symptoms  Reason For Call & Symptoms: 06/11/13 left foot swollen, painful, sensitive.  Treated with soaks, ice, Aleve.  06/17/13 No longer swollen, slightly painful when walking.  Afebrile.   Pt concerned about gout.  Reviewed Health History In EMR: Yes  Reviewed Medications In EMR: Yes  Reviewed Allergies In EMR: Yes  Reviewed Surgeries / Procedures: Yes  Date of Onset of Symptoms: 06/12/2013 OB / GYN:  LMP: Unknown  Guideline(s) Used:  Leg Pain  Foot Pain  Disposition Per Guideline:   See Within 3 Days in Office  Reason For Disposition Reached:   Moderate pain (e.g., interferes with normal activities, limping) and present > 3 days  Advice Given:  Pain Medicines:  For pain relief, you can take either acetaminophen, ibuprofen, or naproxen.  Call Back If:  Swelling, redness, or fever occur  You become worse.  Patient Will Follow Care Advice:  YES

## 2013-06-18 ENCOUNTER — Ambulatory Visit (INDEPENDENT_AMBULATORY_CARE_PROVIDER_SITE_OTHER): Payer: 59 | Admitting: Family Medicine

## 2013-06-18 ENCOUNTER — Encounter: Payer: Self-pay | Admitting: Family Medicine

## 2013-06-18 VITALS — BP 122/80 | HR 64 | Temp 98.1°F | Wt 242.0 lb

## 2013-06-18 DIAGNOSIS — M79672 Pain in left foot: Secondary | ICD-10-CM | POA: Insufficient documentation

## 2013-06-18 DIAGNOSIS — M79609 Pain in unspecified limb: Secondary | ICD-10-CM

## 2013-06-18 MED ORDER — COLCHICINE 0.6 MG PO TABS: 0.6000 mg | ORAL_TABLET | Freq: Two times a day (BID) | ORAL | Status: DC | PRN

## 2013-06-18 MED ORDER — COLCHICINE 0.6 MG PO TABS: 0.6000 mg | ORAL_TABLET | Freq: Every day | ORAL | Status: DC

## 2013-06-18 MED ORDER — TRAMADOL HCL 50 MG PO TABS: 50.0000 mg | ORAL_TABLET | Freq: Two times a day (BID) | ORAL | Status: DC | PRN

## 2013-06-18 NOTE — Telephone Encounter (Signed)
Pt scheduled to see Dr. Reece Agar today

## 2013-06-18 NOTE — Assessment & Plan Note (Signed)
1 wk h/o L foot pain, along MTPJ and dorsal midfoot. Description of pain consistent with gouty flare - check urate and Cr today. Continue aleve 2 pills twice daily, start colchicine for presumed gout. No evidence of infection or bug bites today. Update if not improving as expected.

## 2013-06-18 NOTE — Addendum Note (Signed)
Addended by: Baldomero Lamy on: 06/18/2013 03:12 PM   Modules accepted: Orders

## 2013-06-18 NOTE — Patient Instructions (Signed)
I do think this is gout - we will check blood work today. May use colchicine once to twice daily, as well as aleve. May take tramadol for breakthrough pain if aleve doesn't help. Let us know if not improving as expected.  Gout Gout is an inflammatory condition (arthritis) caused by a buildup of uric acid crystals in the joints. Uric acid is a chemical that is normally present in the blood. Under some circumstances, uric acid can form into crystals in your joints. This causes joint redness, soreness, and swelling (inflammation). Repeat attacks are common. Over time, uric acid crystals can form into masses (tophi) near a joint, causing disfigurement. Gout is treatable and often preventable. CAUSES  The disease begins with elevated levels of uric acid in the blood. Uric acid is produced by your body when it breaks down a naturally found substance called purines. This also happens when you eat certain foods such as meats and fish. Causes of an elevated uric acid level include:  Being passed down from parent to child (heredity).  Diseases that cause increased uric acid production (obesity, psoriasis, some cancers).  Excessive alcohol use.  Diet, especially diets rich in meat and seafood.  Medicines, including certain cancer-fighting drugs (chemotherapy), diuretics, and aspirin.  Chronic kidney disease. The kidneys are no longer able to remove uric acid well.  Problems with metabolism. Conditions strongly associated with gout include:  Obesity.  High blood pressure.  High cholesterol.  Diabetes. Not everyone with elevated uric acid levels gets gout. It is not understood why some people get gout and others do not. Surgery, joint injury, and eating too much of certain foods are some of the factors that can lead to gout. SYMPTOMS   An attack of gout comes on quickly. It causes intense pain with redness, swelling, and warmth in a joint.  Fever can occur.  Often, only one joint is  involved. Certain joints are more commonly involved:  Base of the big toe.  Knee.  Ankle.  Wrist.  Finger. Without treatment, an attack usually goes away in a few days to weeks. Between attacks, you usually will not have symptoms, which is different from many other forms of arthritis. DIAGNOSIS  Your caregiver will suspect gout based on your symptoms and exam. Removal of fluid from the joint (arthrocentesis) is done to check for uric acid crystals. Your caregiver will give you a medicine that numbs the area (local anesthetic) and use a needle to remove joint fluid for exam. Gout is confirmed when uric acid crystals are seen in joint fluid, using a special microscope. Sometimes, blood, urine, and X-ray tests are also used. TREATMENT  There are 2 phases to gout treatment: treating the sudden onset (acute) attack and preventing attacks (prophylaxis). Treatment of an Acute Attack  Medicines are used. These include anti-inflammatory medicines or steroid medicines.  An injection of steroid medicine into the affected joint is sometimes necessary.  The painful joint is rested. Movement can worsen the arthritis.  You may use warm or cold treatments on painful joints, depending which works best for you.  Discuss the use of coffee, vitamin C, or cherries with your caregiver. These may be helpful treatment options. Treatment to Prevent Attacks After the acute attack subsides, your caregiver may advise prophylactic medicine. These medicines either help your kidneys eliminate uric acid from your body or decrease your uric acid production. You may need to stay on these medicines for a very long time. The early phase of treatment with prophylactic  medicine can be associated with an increase in acute gout attacks. For this reason, during the first few months of treatment, your caregiver may also advise you to take medicines usually used for acute gout treatment. Be sure you understand your caregiver's  directions. You should also discuss dietary treatment with your caregiver. Certain foods such as meats and fish can increase uric acid levels. Other foods such as dairy can decrease levels. Your caregiver can give you a list of foods to avoid. HOME CARE INSTRUCTIONS   Do not take aspirin to relieve pain. This raises uric acid levels.  Only take over-the-counter or prescription medicines for pain, discomfort, or fever as directed by your caregiver.  Rest the joint as much as possible. When in bed, keep sheets and blankets off painful areas.  Keep the affected joint raised (elevated).  Use crutches if the painful joint is in your leg.  Drink enough water and fluids to keep your urine clear or pale yellow. This helps your body get rid of uric acid. Do not drink alcoholic beverages. They slow the passage of uric acid.  Follow your caregiver's dietary instructions. Pay careful attention to the amount of protein you eat. Your daily diet should emphasize fruits, vegetables, whole grains, and fat-free or low-fat milk products.  Maintain a healthy body weight. SEEK MEDICAL CARE IF:   You have an oral temperature above 102 F (38.9 C).  You develop diarrhea, vomiting, or any side effects from medicines.  You do not feel better in 24 hours, or you are getting worse. SEEK IMMEDIATE MEDICAL CARE IF:   Your joint becomes suddenly more tender and you have:  Chills.  An oral temperature above 102 F (38.9 C), not controlled by medicine. MAKE SURE YOU:   Understand these instructions.  Will watch your condition.  Will get help right away if you are not doing well or get worse. Document Released: 11/23/2000 Document Revised: 02/18/2012 Document Reviewed: 03/06/2010 [facility] Patient Information 2014 [place], [place].

## 2013-06-18 NOTE — Progress Notes (Signed)
  Subjective:    Patient ID: [name] [name], female    DOB: 05-02-1956, 57 y.o.   MRN: [id number]  HPI CC: foot pain  Walks dog 1-2 miles daily.   Last week after her regular walk, started having worsening left foot pain.  Was walking in field with sandals.  Noticed red spot on midfoot and swelling of entire left foot, tender even with sheets touching midfoot at night.  Using aleve for pain.  Redness didn't spread.  Significant swelling noted as well. Has been nursing foot, seems to be improving.    Taking aleve 440mg  bid which isn't really helping.  No fevers/chills.  No other joint involvement.  No h/o gout. H/o plantar fasciitis.  Lab Results  Component Value Date   CREATININE 1.19* 09/16/2012   Past Medical History  Diagnosis Date  . Diverticulosis of colon   . Hyperlipidemia   . Thyroid disease   . Liver cyst   . Allergy   . GERD (gastroesophageal reflux disease)   . IBS (irritable bowel syndrome)   . Bipolar 1 disorder   . Obesity   . Basal cell carcinoma   . Squamous cell carcinoma      Review of Systems Per HPI    Objective:   Physical Exam  Nursing note and vitals reviewed. Constitutional: She appears well-developed and well-nourished. No distress.  Musculoskeletal: She exhibits no edema (nonpitting).  R foot WNL L great toe with tenderness to palpation at base of MTPJ, as well as some tenderness dorsal midfoot. No pain with axial loading.  + tender with extension of great toe. No significant erythema, edema or calor of foot/MTPJ today.       Assessment & Plan:

## 2013-06-19 ENCOUNTER — Other Ambulatory Visit: Payer: Self-pay | Admitting: Family Medicine

## 2013-06-19 DIAGNOSIS — E875 Hyperkalemia: Secondary | ICD-10-CM

## 2013-06-19 LAB — BASIC METABOLIC PANEL
BUN/Creatinine Ratio: 14 (ref 9–23)
GFR calc Af Amer: 61 mL/min/{1.73_m2} (ref >59)
GFR calc non Af Amer: 53 mL/min/{1.73_m2} — ABNORMAL LOW (ref >59)
Potassium: 5.3 mmol/L — ABNORMAL HIGH (ref 3.5–5.2)
Sodium: 142 mmol/L (ref 134–144)

## 2013-06-19 LAB — URIC ACID: Uric Acid: 7.9 mg/dL — ABNORMAL HIGH (ref 2.5–7.1)

## 2013-06-24 ENCOUNTER — Other Ambulatory Visit (INDEPENDENT_AMBULATORY_CARE_PROVIDER_SITE_OTHER): Payer: 59

## 2013-06-24 DIAGNOSIS — E876 Hypokalemia: Secondary | ICD-10-CM | POA: Diagnosis not present

## 2013-06-24 NOTE — Addendum Note (Signed)
Addended by: Liane Comber C on: 06/24/2013 11:54 AM   Modules accepted: Orders

## 2013-06-25 ENCOUNTER — Encounter: Payer: Self-pay | Admitting: *Deleted

## 2013-06-29 ENCOUNTER — Ambulatory Visit (INDEPENDENT_AMBULATORY_CARE_PROVIDER_SITE_OTHER)
Admission: RE | Admit: 2013-06-29 | Discharge: 2013-06-29 | Disposition: A | Discharge status: Home / Self Care | Payer: 59 | Source: Ambulatory Visit | Attending: Family Medicine | Admitting: Family Medicine

## 2013-06-29 ENCOUNTER — Encounter: Payer: Self-pay | Admitting: Family Medicine

## 2013-06-29 ENCOUNTER — Ambulatory Visit (INDEPENDENT_AMBULATORY_CARE_PROVIDER_SITE_OTHER): Payer: 59 | Admitting: Family Medicine

## 2013-06-29 VITALS — BP 116/72 | HR 71 | Temp 98.1°F | Ht 60.75 in | Wt 243.8 lb

## 2013-06-29 DIAGNOSIS — M79672 Pain in left foot: Secondary | ICD-10-CM

## 2013-06-29 DIAGNOSIS — M79609 Pain in unspecified limb: Secondary | ICD-10-CM

## 2013-06-29 MED ORDER — PREDNISONE 10 MG PO TABS: ORAL_TABLET | ORAL | Status: DC

## 2013-06-29 NOTE — Progress Notes (Signed)
Subjective:    Patient ID: [name] [name], female    DOB: 1956-05-15, 57 y.o.   MRN: [id number]  HPI Here for a gout flare in her L foot - started 2 weeks ago  Thought it would get better  Did not improve  Saw Dr Reece Agar- and he tx her with cochicine - which helped for 2-3 days then she decided to start walking again (has been walking at least a mi a day)   Then symptoms came back  Gets waves of pain - whether resting or walking  Tramadol and colchicine   (last dose of tramadol was last night)- even did not help much Hurt for the sheet to touch it in bed   Most of her pain is under her R big toe -some swelling but not a lot of redness  No fever that she knows of  She has been drinking a lot of water  Started eating cherries   Father had gout  Lab Results  Component Value Date   URICACID 7.9* 06/18/2013   did not get and xray   Patient Active Problem List   Diagnosis Date Noted  . Left foot pain 06/18/2013  . Encounter for examination for insurance purposes 09/16/2012  . Right arm pain 05/13/2012  . ETD (eustachian tube dysfunction) 04/11/2012  . Cerumen impaction, right 04/11/2012  . Other screening mammogram 10/15/2011  . Routine general medical examination at a health care facility 10/04/2011  . Low back pain 09/17/2011  . Tinea corporis 09/05/2011  . Bartholin gland cyst 08/21/2011  . CHEST PAIN 01/29/2011  . NEOPLASM OF UNCERTAIN BEHAVIOR OF SKIN 10/04/2010  . METATARSALGIA 12/01/2009  . POSTMENOPAUSAL STATUS 09/30/2009  . ARTHRITIS, RIGHT HIP 03/10/2009  . DEGENERATIVE DISC DISEASE, LUMBAR SPINE 03/10/2009  . ONYCHOMYCOSIS 09/17/2008  . HYPOTHYROIDISM 08/05/2007  . OBESITY 08/05/2007  . BIPOLAR DISORDER UNSPECIFIED 08/05/2007  . ALLERGIC RHINITIS 08/05/2007  . GERD 08/05/2007  . IBS 08/05/2007  . HYPERCHOLESTEROLEMIA, PURE 07/07/2007   Past Medical History  Diagnosis Date  . Diverticulosis of colon   . Hyperlipidemia   . Thyroid disease   . Liver cyst   .  Allergy   . GERD (gastroesophageal reflux disease)   . IBS (irritable bowel syndrome)   . Bipolar 1 disorder   . Obesity   . Basal cell carcinoma   . Squamous cell carcinoma    Past Surgical History  Procedure Laterality Date  . Cardiovascular stress test  02/23/11  . Cardiac catheterization  04/1999  . Cataract extraction  09/2002  . Cholecystectomy    . Colonoscopy  11/2002   History  Substance Use Topics  . Smoking status: Never Smoker   . Smokeless tobacco: Not on file  . Alcohol Use: No   Family History  Problem Relation Age of Onset  . Stroke Mother   . Heart disease Mother   . Hypertension Father   . Diabetes Father   . Hyperlipidemia Father    Allergies  Allergen Reactions  . Caine-1 (Lidocaine) Swelling    REACTION: swelling ALL CAINE DRUGS EXCEPT: marcaine & sensercaine  . Carbamazepine     REACTION: skin crawls  . Codeine     REACTION: vomits blood  . Hydrocod Polst-Cpm Polst Er Other (See Comments)    To strong to take along with her other medication  . Lamotrigine Other (See Comments)    REACTION: sores in mouth, muscle aches and also bipolar made her manic  . Lidocaine Hcl Swelling  .  Lithium Swelling   Current Outpatient Prescriptions on File Prior to Visit  Medication Sig Dispense Refill  . ARIPiprazole (ABILIFY) 30 MG tablet Take 30 mg by mouth daily.        Marland Kitchen aspirin 81 MG tablet Take 81 mg by mouth daily.        Marland Kitchen buPROPion (WELLBUTRIN XL) 150 MG 24 hr tablet Take two tablets by mouth every morning       . Calcium Citrate-Vitamin D 315-250 MG-UNIT TABS Take 1 tablet by mouth daily.       . cetirizine (ZYRTEC) 10 MG tablet Take 10 mg by mouth daily.        . Cholecalciferol (VITAMIN D) 2000 UNITS CAPS Take 1 capsule by mouth daily.        . clonazePAM (KLONOPIN) 1 MG tablet Take 1 mg by mouth at bedtime as needed.        . colchicine 0.6 MG tablet Take 1 tablet (0.6 mg total) by mouth 2 (two) times daily as needed (gout flare).  30 tablet  1   . fluticasone (FLONASE) 50 MCG/ACT nasal spray Place 2 sprays into the nose daily.  48 g  3  . lansoprazole (PREVACID) 30 MG capsule Take 1 capsule (30 mg total) by mouth daily.  90 capsule  1  . levothyroxine (SYNTHROID, LEVOTHROID) 50 MCG tablet Take 1 tablet (50 mcg total) by mouth daily.  90 tablet  1  . lovastatin (MEVACOR) 40 MG tablet Take 1 tablet (40 mg total) by mouth daily.  90 tablet  1  . Multiple Vitamin (MULTIVITAMIN) tablet Take 1 tablet by mouth daily.        . naproxen sodium (ALEVE) 220 MG tablet Take 220 mg by mouth 2 (two) times daily with a meal.       . olanzapine-fluoxetine (SYMBYAX) 3-25 MG per capsule Take 1 capsule by mouth at bedtime.        . Omega-3 Fatty Acids (FISH OIL) 1000 MG CAPS Take 1 capsule by mouth daily.        . traMADol (ULTRAM) 50 MG tablet Take 1 tablet (50 mg total) by mouth 2 (two) times daily as needed for pain.  30 tablet  0  . zolpidem (AMBIEN) 10 MG tablet Take 10 mg by mouth daily.         No current facility-administered medications on file prior to visit.     Review of Systems Review of Systems  Constitutional: Negative for fever, appetite change, fatigue and unexpected weight change.  Eyes: Negative for pain and visual disturbance.  Respiratory: Negative for cough and shortness of breath.   Cardiovascular: Negative for cp or palpitations    Gastrointestinal: Negative for nausea, diarrhea and constipation.  Genitourinary: Negative for urgency and frequency.  Skin: Negative for pallor or rash   MSK pos for L great toe and foot pain with swelling/ neg for other joint changes  Neurological: Negative for weakness, light-headedness, numbness and headaches.  Hematological: Negative for adenopathy. Does not bruise/bleed easily.  Psychiatric/Behavioral: Negative for dysphoric mood. The patient is not nervous/anxious.         Objective:   Physical Exam  Constitutional: She appears well-developed and well-nourished. No distress.  HENT:   Head: Normocephalic and atraumatic.  Eyes: Conjunctivae and EOM are normal. Pupils are equal, round, and reactive to light. No scleral icterus.  Neck: Normal range of motion. Neck supple.  Cardiovascular: Normal rate and regular rhythm.   Pulmonary/Chest: Effort normal and breath  sounds normal. No respiratory distress. She has no wheezes.  Musculoskeletal: She exhibits edema and tenderness.  Tenderness over L first MTP joint- especially plantar surface with slt swelling but no erythema or warmth Full rom but with discomfort   Lymphadenopathy:    She has no cervical adenopathy.  Neurological: She is alert. She has normal reflexes. No cranial nerve deficit or sensory deficit. She exhibits normal muscle tone. Coordination normal.  Skin: Skin is warm and dry. No rash noted. No erythema. No pallor.  Psychiatric: She has a normal mood and affect.          Assessment & Plan:

## 2013-06-29 NOTE — Patient Instructions (Addendum)
Watch diet for high purine foods- see handout  Xray of foot today-we will get you a result tomorrow  Start prednisone as directed  Elevate foot - cold or warm compress is ok if needed

## 2013-06-29 NOTE — Assessment & Plan Note (Signed)
Primarily over great toe joint with hx of elevated uric acid level and some imp on colchicine Suspect gout- but due to failure of further improvement will check foot xray  Given pred taper-disc side eff  Once imp- if this becomes recurrent, may want to consider allopurinol or other prev agent  Given handouts on gout and low purine diet today

## 2013-08-06 DIAGNOSIS — F314 Bipolar disorder, current episode depressed, severe, without psychotic features: Secondary | ICD-10-CM | POA: Diagnosis not present

## 2013-09-23 DIAGNOSIS — F314 Bipolar disorder, current episode depressed, severe, without psychotic features: Secondary | ICD-10-CM | POA: Diagnosis not present

## 2013-10-12 ENCOUNTER — Telehealth: Payer: Self-pay | Admitting: Family Medicine

## 2013-10-12 ENCOUNTER — Other Ambulatory Visit: Payer: 59

## 2013-10-12 DIAGNOSIS — E7800 Pure hypercholesterolemia, unspecified: Secondary | ICD-10-CM

## 2013-10-12 DIAGNOSIS — E039 Hypothyroidism, unspecified: Secondary | ICD-10-CM

## 2013-10-12 DIAGNOSIS — Z0000 Encounter for general adult medical examination without abnormal findings: Secondary | ICD-10-CM

## 2013-10-12 NOTE — Telephone Encounter (Signed)
Message copied by Judy Pimple on Mon Oct 12, 2013  9:42 PM ------      Message from: Alvina Chou      Created: Fri Oct 09, 2013  5:02 PM      Regarding: Lab orders for Tuesday, 11.4.14       Patient is scheduled for CPX labs, please order future labs, Thanks , Terri       ------

## 2013-10-13 ENCOUNTER — Other Ambulatory Visit (INDEPENDENT_AMBULATORY_CARE_PROVIDER_SITE_OTHER): Payer: 59

## 2013-10-13 DIAGNOSIS — Z0000 Encounter for general adult medical examination without abnormal findings: Secondary | ICD-10-CM

## 2013-10-13 DIAGNOSIS — E039 Hypothyroidism, unspecified: Secondary | ICD-10-CM

## 2013-10-13 DIAGNOSIS — E7800 Pure hypercholesterolemia, unspecified: Secondary | ICD-10-CM

## 2013-10-14 LAB — CBC WITH DIFFERENTIAL/PLATELET
Basos: 0 %
Eos: 0 %
HCT: 38.8 % (ref 34.0–46.6)
Hemoglobin: 13 g/dL (ref 11.1–15.9)
Immature Grans (Abs): 0 10*3/uL (ref 0.0–0.1)
Lymphs: 27 %
MCHC: 33.5 g/dL (ref 31.5–35.7)
MCV: 87 fL (ref 79–97)
Monocytes Absolute: 0.4 10*3/uL (ref 0.1–0.9)
Neutrophils Absolute: 3.7 10*3/uL (ref 1.4–7.0)
Neutrophils Relative %: 66 %
RBC: 4.45 x10E6/uL (ref 3.77–5.28)
WBC: 5.7 10*3/uL (ref 3.4–10.8)

## 2013-10-14 LAB — LIPID PANEL
Chol/HDL Ratio: 2.5 ratio units (ref 0.0–4.4)
Cholesterol, Total: 173 mg/dL (ref 100–199)
HDL: 68 mg/dL (ref >39)
VLDL Cholesterol Cal: 24 mg/dL (ref 5–40)

## 2013-10-14 LAB — COMPREHENSIVE METABOLIC PANEL
ALT: 21 IU/L (ref 0–32)
AST: 24 IU/L (ref 0–40)
Albumin/Globulin Ratio: 2.1 (ref 1.1–2.5)
Albumin: 4.1 g/dL (ref 3.5–5.5)
Creatinine, Ser: 1 mg/dL (ref 0.57–1.00)
GFR calc Af Amer: 73 mL/min/{1.73_m2} (ref >59)
GFR calc non Af Amer: 64 mL/min/{1.73_m2} (ref >59)
Glucose: 102 mg/dL — ABNORMAL HIGH (ref 65–99)
Potassium: 4.8 mmol/L (ref 3.5–5.2)
Sodium: 144 mmol/L (ref 134–144)
Total Bilirubin: 0.3 mg/dL (ref 0.0–1.2)

## 2013-10-16 ENCOUNTER — Telehealth: Payer: Self-pay

## 2013-10-16 ENCOUNTER — Encounter: Payer: Self-pay | Admitting: Family Medicine

## 2013-10-16 ENCOUNTER — Ambulatory Visit (INDEPENDENT_AMBULATORY_CARE_PROVIDER_SITE_OTHER): Payer: 59 | Admitting: Family Medicine

## 2013-10-16 VITALS — BP 138/72 | HR 71 | Temp 98.1°F | Ht 61.0 in | Wt 247.2 lb

## 2013-10-16 DIAGNOSIS — Z1211 Encounter for screening for malignant neoplasm of colon: Secondary | ICD-10-CM | POA: Diagnosis not present

## 2013-10-16 DIAGNOSIS — Z0000 Encounter for general adult medical examination without abnormal findings: Secondary | ICD-10-CM | POA: Diagnosis not present

## 2013-10-16 DIAGNOSIS — E039 Hypothyroidism, unspecified: Secondary | ICD-10-CM | POA: Diagnosis not present

## 2013-10-16 DIAGNOSIS — E669 Obesity, unspecified: Secondary | ICD-10-CM | POA: Diagnosis not present

## 2013-10-16 DIAGNOSIS — Z23 Encounter for immunization: Secondary | ICD-10-CM | POA: Diagnosis not present

## 2013-10-16 DIAGNOSIS — E7800 Pure hypercholesterolemia, unspecified: Secondary | ICD-10-CM | POA: Diagnosis not present

## 2013-10-16 MED ORDER — LEVOTHYROXINE SODIUM 50 MCG PO TABS: 50.0000 ug | ORAL_TABLET | Freq: Every day | ORAL | Status: DC

## 2013-10-16 MED ORDER — LANSOPRAZOLE 30 MG PO CPDR: 30.0000 mg | DELAYED_RELEASE_CAPSULE | Freq: Every day | ORAL | Status: DC

## 2013-10-16 MED ORDER — LOVASTATIN 40 MG PO TABS: 40.0000 mg | ORAL_TABLET | Freq: Every day | ORAL | Status: DC

## 2013-10-16 NOTE — Progress Notes (Signed)
Subjective:    Patient ID: [name] [name], female    DOB: [birth year], 57 y.o.   MRN: [id number]  HPI I have personally reviewed the Medicare Annual Wellness questionnaire and have noted 1. The patient's medical and social history 2. Their use of alcohol, tobacco or illicit drugs 3. Their current medications and supplements 4. The patient's functional ability including ADL's, fall risks, home safety risks and hearing or visual             impairment. 5. Diet and physical activities 6. Evidence for depression or mood disorders  The patients weight, height, BMI have been recorded in the chart and visual acuity is per eye clinic.  I have made referrals, counseling and provided education to the patient based review of the above and I have provided the pt with a written personalized care plan for preventive services.  Wt is up 4 lb with bmi of 46 Ate too much on a cruise in October  Also ate too much candy  She has been thinking about bariatric surgery -- her psychiatrist said no gastric bypass but would qualify for sleeve or the band  The weight is very frustrating to her  Needs to get back to exercise (did loose wt with 60-90 min per day)  Pap smear was in 2012 -nl / no problems    See scanned forms.  Routine anticipatory guidance given to patient.  See health maintenance. Flu shot 9/14 Shingles vaccine 5/12 PNA 9/07 vaccine  Tetanus 10/10 Colonoscopy was in 2003 -normal per pt , and she does not want to repeat one -will do IFOB Breast cancer screening- 12/13 normal - at [facility] (she will schedule her own annual mammogram) No lumps on self exam Advance directive- does not have a living will or POA  Cognitive function addressed- see scanned forms- and if abnormal then additional documentation follows.  Memory is pretty good (she watches out for that because both parents had dementia)  Mood - bipolar/ sees psychiatry and doing pretty well   PMH and SH reviewed  Meds, vitals, and  allergies reviewed.   ROS: See HPI.  Otherwise negative.    Hyperlipidemia No results found for this basename: CHOL   Lab Results  Component Value Date   HDL 68 10/13/2013   HDL 61 10/18/1477   HDL 64 29/56/2130   Lab Results  Component Value Date   LDLCALC 81 10/13/2013   LDLCALC 85 09/16/2012   LDLCALC 77 10/04/2011   Lab Results  Component Value Date   TRIG 118 10/13/2013   TRIG 127 09/16/2012   TRIG 162* 10/04/2011   Lab Results  Component Value Date   CHOLHDL 2.5 10/13/2013   CHOLHDL 2.8 09/16/2012   No results found for this basename: LDLDIRECT   on mevacor and also low fat diet    Hypothyroidism  Pt has no clinical changes No change in energy level/ hair or skin/ edema and no tremor Lab Results  Component Value Date   TSH 1.880 10/13/2013       Chemistry      Component Value Date/Time   NA 144 10/13/2013 0841   K 4.8 10/13/2013 0841   CL 103 10/13/2013 0841   CO2 27 10/13/2013 0841   BUN 18 10/13/2013 0841   CREATININE 1.00 10/13/2013 0841      Component Value Date/Time   CALCIUM 9.4 10/13/2013 0841   ALKPHOS 82 10/13/2013 0841   AST 24 10/13/2013 0841   ALT 21 10/13/2013 0841  BILITOT 0.3 10/13/2013 0841      Patient Active Problem List   Diagnosis Date Noted  . Left foot pain 06/18/2013  . Encounter for examination for insurance purposes 09/16/2012  . Right arm pain 05/13/2012  . ETD (eustachian tube dysfunction) 04/11/2012  . Cerumen impaction, right 04/11/2012  . Other screening mammogram 10/15/2011  . Encounter for Medicare annual wellness exam 10/04/2011  . Low back pain 09/17/2011  . Tinea corporis 09/05/2011  . Bartholin gland cyst 08/21/2011  . CHEST PAIN 01/29/2011  . NEOPLASM OF UNCERTAIN BEHAVIOR OF SKIN 10/04/2010  . METATARSALGIA 12/01/2009  . POSTMENOPAUSAL STATUS 09/30/2009  . ARTHRITIS, RIGHT HIP 03/10/2009  . DEGENERATIVE DISC DISEASE, LUMBAR SPINE 03/10/2009  . ONYCHOMYCOSIS 09/17/2008  . HYPOTHYROIDISM 08/05/2007  . OBESITY  08/05/2007  . BIPOLAR DISORDER UNSPECIFIED 08/05/2007  . ALLERGIC RHINITIS 08/05/2007  . GERD 08/05/2007  . IBS 08/05/2007  . HYPERCHOLESTEROLEMIA, PURE 07/07/2007   Past Medical History  Diagnosis Date  . Diverticulosis of colon   . Hyperlipidemia   . Thyroid disease   . Liver cyst   . Allergy   . GERD (gastroesophageal reflux disease)   . IBS (irritable bowel syndrome)   . Bipolar 1 disorder   . Obesity   . Basal cell carcinoma   . Squamous cell carcinoma    Past Surgical History  Procedure Laterality Date  . Cardiovascular stress test  02/23/11  . Cardiac catheterization  04/1999  . Cataract extraction  09/2002  . Cholecystectomy    . Colonoscopy  11/2002   History  Substance Use Topics  . Smoking status: Never Smoker   . Smokeless tobacco: Not on file  . Alcohol Use: No   Family History  Problem Relation Age of Onset  . Stroke Mother   . Heart disease Mother   . Hypertension Father   . Diabetes Father   . Hyperlipidemia Father    Allergies  Allergen Reactions  . Caine-1 [Lidocaine] Swelling    REACTION: swelling ALL CAINE DRUGS EXCEPT: marcaine & sensercaine  . Carbamazepine     REACTION: skin crawls  . Codeine     REACTION: vomits blood  . Hydrocod Polst-Cpm Polst Er Other (See Comments)    To strong to take along with her other medication  . Lamotrigine Other (See Comments)    REACTION: sores in mouth, muscle aches and also bipolar made her manic  . Lidocaine Hcl Swelling  . Lithium Swelling   Current Outpatient Prescriptions on File Prior to Visit  Medication Sig Dispense Refill  . ARIPiprazole (ABILIFY) 30 MG tablet Take 30 mg by mouth daily.        Marland Kitchen aspirin 81 MG tablet Take 81 mg by mouth daily.        Marland Kitchen buPROPion (WELLBUTRIN XL) 150 MG 24 hr tablet Take two tablets by mouth every morning       . Calcium Citrate-Vitamin D 315-250 MG-UNIT TABS Take 1 tablet by mouth daily.       . cetirizine (ZYRTEC) 10 MG tablet Take 10 mg by mouth daily.         . Cholecalciferol (VITAMIN D) 2000 UNITS CAPS Take 1 capsule by mouth daily.        . clonazePAM (KLONOPIN) 1 MG tablet Take 1 mg by mouth at bedtime as needed.        . fluticasone (FLONASE) 50 MCG/ACT nasal spray Place 2 sprays into the nose daily.  48 g  3  . lansoprazole (PREVACID) 30  MG capsule Take 1 capsule (30 mg total) by mouth daily.  90 capsule  1  . levothyroxine (SYNTHROID, LEVOTHROID) 50 MCG tablet Take 1 tablet (50 mcg total) by mouth daily.  90 tablet  1  . lovastatin (MEVACOR) 40 MG tablet Take 1 tablet (40 mg total) by mouth daily.  90 tablet  1  . Multiple Vitamin (MULTIVITAMIN) tablet Take 1 tablet by mouth daily.        . naproxen sodium (ALEVE) 220 MG tablet Take 220 mg by mouth 2 (two) times daily with a meal.       . olanzapine-fluoxetine (SYMBYAX) 3-25 MG per capsule Take 1 capsule by mouth at bedtime.        . Omega-3 Fatty Acids (FISH OIL) 1000 MG CAPS Take 1 capsule by mouth daily.        Marland Kitchen zolpidem (AMBIEN) 10 MG tablet Take 10 mg by mouth daily.        . colchicine 0.6 MG tablet Take 1 tablet (0.6 mg total) by mouth 2 (two) times daily as needed (gout flare).  30 tablet  1  . traMADol (ULTRAM) 50 MG tablet Take 1 tablet (50 mg total) by mouth 2 (two) times daily as needed for pain.  30 tablet  0   No current facility-administered medications on file prior to visit.    Review of Systems Review of Systems  Constitutional: Negative for fever, appetite change, fatigue and unexpected weight change.  Eyes: Negative for pain and visual disturbance.  Respiratory: Negative for cough and shortness of breath.   Cardiovascular: Negative for cp or palpitations    Gastrointestinal: Negative for nausea, diarrhea and constipation.  Genitourinary: Negative for urgency and frequency.  Skin: Negative for pallor or rash   Neurological: Negative for weakness, light-headedness, numbness and headaches.  Hematological: Negative for adenopathy. Does not bruise/bleed easily.   Psychiatric/Behavioral: pos for mood lability/ bipolar that is overall much improved         Objective:   Physical Exam  Constitutional: She appears well-developed and well-nourished. No distress.  Morbidly obese and well app  HENT:  Head: Normocephalic and atraumatic.  Right Ear: External ear normal.  Left Ear: External ear normal.  Mouth/Throat: Oropharynx is clear and moist.  Eyes: Conjunctivae and EOM are normal. Pupils are equal, round, and reactive to light. No scleral icterus.  Neck: Normal range of motion. Neck supple. No JVD present. Carotid bruit is not present. No thyromegaly present.  Cardiovascular: Normal rate, regular rhythm, normal heart sounds and intact distal pulses.  Exam reveals no gallop.   Pulmonary/Chest: Effort normal and breath sounds normal. No respiratory distress. She has no wheezes. She exhibits no tenderness.  Abdominal: Soft. Bowel sounds are normal. She exhibits no distension, no abdominal bruit and no mass. There is no tenderness.  Genitourinary: No breast swelling, tenderness, discharge or bleeding.  Breast exam: No mass, nodules, thickening, tenderness, bulging, retraction, inflamation, nipple discharge or skin changes noted.  No axillary or clavicular LA.  Chaperoned exam.    Musculoskeletal: Normal range of motion. She exhibits no edema and no tenderness.  Lymphadenopathy:    She has no cervical adenopathy.  Neurological: She is alert. She has normal reflexes. No cranial nerve deficit. She exhibits normal muscle tone. Coordination normal.  Skin: Skin is warm and dry. No rash noted. No erythema. No pallor.  lentigos and few SKS  2 tiny erythematous areas on R hand susp for eczema  Psychiatric: She has a normal mood and  affect.  cheerful          Assessment & Plan:

## 2013-10-16 NOTE — Patient Instructions (Signed)
Pneumonia vaccine today Please do stool card for colon cancer screening  Think about drawing up a living will- that is important for everyone Work on weight loss You can check out one of the bariatric surgery info sessions at [facility]

## 2013-10-16 NOTE — Progress Notes (Signed)
Pre-visit discussion using our clinic review tool. No additional management support is needed unless otherwise documented below in the visit note.  

## 2013-10-16 NOTE — Telephone Encounter (Signed)
Pt was seen today and requested copy of labs done 10/13/13; pt checked with her paperwork from todays visit and pt has copy of labs; nothing further needed.

## 2013-10-18 NOTE — Assessment & Plan Note (Signed)
Discussed how this problem influences overall health and the risks it imposes  Reviewed plan for weight loss with lower calorie diet (via better food choices and also portion control or program like weight watchers) and exercise building up to or more than 30 minutes 5 days per week including some aerobic activity   She is interested in bariatric surgery in the future-will check out the info session at [facility]

## 2013-10-18 NOTE — Assessment & Plan Note (Signed)
Disc goals for lipids and reasons to control them Rev labs with pt Rev low sat fat diet in detail  mevacor and diet-good control

## 2013-10-18 NOTE — Assessment & Plan Note (Signed)
Hypothyroidism  Pt has no clinical changes No change in energy level/ hair or skin/ edema and no tremor Lab Results  Component Value Date   TSH 1.880 10/13/2013

## 2013-10-18 NOTE — Assessment & Plan Note (Signed)
D/w patient re:options for colon cancer screening, including IFOB vs. colonoscopy.  Risks and benefits of both were discussed and patient voiced understanding.  Pt elects for:IFOB card  

## 2013-10-18 NOTE — Assessment & Plan Note (Signed)
Reviewed health habits including diet and exercise and skin cancer prevention Also reviewed health mt list, fam hx and immunizations  Enc wt loss See HPI Wellness labs reviewed

## 2013-10-19 ENCOUNTER — Telehealth: Payer: Self-pay

## 2013-10-19 DIAGNOSIS — Z1211 Encounter for screening for malignant neoplasm of colon: Secondary | ICD-10-CM

## 2013-10-19 NOTE — Telephone Encounter (Signed)
It sounds like a local reaction - I would use heat or cold (whatever feels better)- and follow up for a check if it worsens further or if she develops fever or other symptoms  I will put in a [facility] order for the ifob- I put it in the computer as a [facility] lab order - I do not think anything is printing out -- it may need to be printed (I do not know how unfortunately)

## 2013-10-19 NOTE — Telephone Encounter (Signed)
Pt left v/m got pneumonia vaccine on 10/16/13 and started as quarter size reaction on arm and now baseball sized reaction that is very red and swollen. Pt wants to know if needs to be concerned and/or seen. Pt also wants written order for ifob for stool specimen for [facility]; pt will pick order up at front desk 10/20/13.Please advise.

## 2013-10-20 ENCOUNTER — Other Ambulatory Visit: Payer: Self-pay | Admitting: Family Medicine

## 2013-10-20 NOTE — Telephone Encounter (Signed)
Pt notified of Dr. Milinda Antis advise regarding local reaction to vaccine, and I advise pt order form is ready for pick-up

## 2013-11-06 LAB — FECAL OCCULT BLOOD, IMMUNOCHEMICAL: Fecal Occult Bld: NEGATIVE

## 2013-11-09 ENCOUNTER — Encounter: Payer: Self-pay | Admitting: *Deleted

## 2013-11-27 ENCOUNTER — Encounter: Payer: Self-pay | Admitting: Family Medicine

## 2013-11-27 ENCOUNTER — Ambulatory Visit (INDEPENDENT_AMBULATORY_CARE_PROVIDER_SITE_OTHER): Payer: 59 | Admitting: Family Medicine

## 2013-11-27 ENCOUNTER — Ambulatory Visit (INDEPENDENT_AMBULATORY_CARE_PROVIDER_SITE_OTHER)
Admission: RE | Admit: 2013-11-27 | Discharge: 2013-11-27 | Disposition: A | Discharge status: Home / Self Care | Payer: 59 | Source: Ambulatory Visit | Attending: Family Medicine | Admitting: Family Medicine

## 2013-11-27 VITALS — BP 110/68 | HR 70 | Temp 98.1°F | Ht 61.0 in | Wt 244.5 lb

## 2013-11-27 DIAGNOSIS — M25569 Pain in unspecified knee: Secondary | ICD-10-CM

## 2013-11-27 DIAGNOSIS — M25562 Pain in left knee: Secondary | ICD-10-CM

## 2013-11-27 DIAGNOSIS — M1710 Unilateral primary osteoarthritis, unspecified knee: Secondary | ICD-10-CM | POA: Diagnosis not present

## 2013-11-27 MED ORDER — MELOXICAM 15 MG PO TABS: 15.0000 mg | ORAL_TABLET | Freq: Every day | ORAL | Status: DC

## 2013-11-27 MED ORDER — TRAMADOL HCL 50 MG PO TABS: 50.0000 mg | ORAL_TABLET | Freq: Two times a day (BID) | ORAL | Status: DC | PRN

## 2013-11-27 NOTE — Patient Instructions (Signed)
Elevate knee when you can and use ice/ cold compress Xray now - will call you with results mobic 15 mg daily with food as needed  Use tramadol with caution as needed

## 2013-11-27 NOTE — Progress Notes (Signed)
Pre-visit discussion using our clinic review tool. No additional management support is needed unless otherwise documented below in the visit note.  

## 2013-11-27 NOTE — Progress Notes (Signed)
Subjective:    Patient ID: [name] [name], female    DOB: 1956-03-22, 57 y.o.   MRN: [id number]  HPI Here with L knee problem  Last week her knee began to hurt and swell  Makes "clicking" noise when she moves/ kind of grinds  Is difficult to walk and bend  No redness or heat - just swelling    (colchicine did not help)  No knee injury  ? If she has arthritis in knee-has it in a lot of places   Now she is taking aleve and then some left over tramadol  No ice or heat   Patient Active Problem List   Diagnosis Date Noted  . Colon cancer screening 10/16/2013  . Left foot pain 06/18/2013  . Encounter for examination for insurance purposes 09/16/2012  . Right arm pain 05/13/2012  . ETD (eustachian tube dysfunction) 04/11/2012  . Cerumen impaction, right 04/11/2012  . Other screening mammogram 10/15/2011  . Encounter for Medicare annual wellness exam 10/04/2011  . Low back pain 09/17/2011  . Tinea corporis 09/05/2011  . Bartholin gland cyst 08/21/2011  . CHEST PAIN 01/29/2011  . NEOPLASM OF UNCERTAIN BEHAVIOR OF SKIN 10/04/2010  . METATARSALGIA 12/01/2009  . POSTMENOPAUSAL STATUS 09/30/2009  . ARTHRITIS, RIGHT HIP 03/10/2009  . DEGENERATIVE DISC DISEASE, LUMBAR SPINE 03/10/2009  . ONYCHOMYCOSIS 09/17/2008  . HYPOTHYROIDISM 08/05/2007  . OBESITY 08/05/2007  . BIPOLAR DISORDER UNSPECIFIED 08/05/2007  . ALLERGIC RHINITIS 08/05/2007  . GERD 08/05/2007  . IBS 08/05/2007  . HYPERCHOLESTEROLEMIA, PURE 07/07/2007   Past Medical History  Diagnosis Date  . Diverticulosis of colon   . Hyperlipidemia   . Thyroid disease   . Liver cyst   . Allergy   . GERD (gastroesophageal reflux disease)   . IBS (irritable bowel syndrome)   . Bipolar 1 disorder   . Obesity   . Basal cell carcinoma   . Squamous cell carcinoma    Past Surgical History  Procedure Laterality Date  . Cardiovascular stress test  02/23/11  . Cardiac catheterization  04/1999  . Cataract extraction  09/2002  .  Cholecystectomy    . Colonoscopy  11/2002   History  Substance Use Topics  . Smoking status: Never Smoker   . Smokeless tobacco: Not on file  . Alcohol Use: No   Family History  Problem Relation Age of Onset  . Stroke Mother   . Heart disease Mother   . Hypertension Father   . Diabetes Father   . Hyperlipidemia Father    Allergies  Allergen Reactions  . Caine-1 [Lidocaine] Swelling    REACTION: swelling ALL CAINE DRUGS EXCEPT: marcaine & sensercaine  . Carbamazepine     REACTION: skin crawls  . Codeine     REACTION: vomits blood  . Hydrocod Polst-Cpm Polst Er Other (See Comments)    To strong to take along with her other medication  . Lamotrigine Other (See Comments)    REACTION: sores in mouth, muscle aches and also bipolar made her manic  . Lidocaine Hcl Swelling  . Lithium Swelling   Current Outpatient Prescriptions on File Prior to Visit  Medication Sig Dispense Refill  . ARIPiprazole (ABILIFY) 30 MG tablet Take 30 mg by mouth daily.        Marland Kitchen aspirin 81 MG tablet Take 81 mg by mouth daily.        Marland Kitchen buPROPion (WELLBUTRIN XL) 150 MG 24 hr tablet Take two tablets by mouth every morning       .  Calcium Citrate-Vitamin D 315-250 MG-UNIT TABS Take 1 tablet by mouth daily.       . cetirizine (ZYRTEC) 10 MG tablet Take 10 mg by mouth daily.        . Cholecalciferol (VITAMIN D) 2000 UNITS CAPS Take 1 capsule by mouth daily.        . clonazePAM (KLONOPIN) 1 MG tablet Take 1 mg by mouth at bedtime as needed.        . colchicine 0.6 MG tablet Take 1 tablet (0.6 mg total) by mouth 2 (two) times daily as needed (gout flare).  30 tablet  1  . fluticasone (FLONASE) 50 MCG/ACT nasal spray Place 2 sprays into the nose daily.  48 g  3  . lansoprazole (PREVACID) 30 MG capsule Take 1 capsule (30 mg total) by mouth daily.  90 capsule  1  . levothyroxine (SYNTHROID, LEVOTHROID) 50 MCG tablet Take 1 tablet (50 mcg total) by mouth daily.  90 tablet  1  . lovastatin (MEVACOR) 40 MG tablet  Take 1 tablet (40 mg total) by mouth daily.  90 tablet  1  . Multiple Vitamin (MULTIVITAMIN) tablet Take 1 tablet by mouth daily.        . naproxen sodium (ALEVE) 220 MG tablet Take 220 mg by mouth 2 (two) times daily with a meal.       . olanzapine-fluoxetine (SYMBYAX) 3-25 MG per capsule Take 1 capsule by mouth at bedtime.        . Omega-3 Fatty Acids (FISH OIL) 1000 MG CAPS Take 1 capsule by mouth daily.        . traMADol (ULTRAM) 50 MG tablet Take 1 tablet (50 mg total) by mouth 2 (two) times daily as needed for pain.  30 tablet  0  . zolpidem (AMBIEN) 10 MG tablet Take 10 mg by mouth daily.         No current facility-administered medications on file prior to visit.      Review of Systems Review of Systems  Constitutional: Negative for fever, appetite change, fatigue and unexpected weight change.  Eyes: Negative for pain and visual disturbance.  Respiratory: Negative for cough and shortness of breath.   Cardiovascular: Negative for cp or palpitations    Gastrointestinal: Negative for nausea, diarrhea and constipation.  Genitourinary: Negative for urgency and frequency.  Skin: Negative for pallor or rash   MSK pos for knee pain / neg for redness/ heat, neg for other joint swelling  Neurological: Negative for weakness, light-headedness, numbness and headaches.  Hematological: Negative for adenopathy. Does not bruise/bleed easily.  Psychiatric/Behavioral: Negative for dysphoric mood. The patient is not nervous/anxious.         Objective:   Physical Exam  Constitutional: She appears well-developed and well-nourished. No distress.  obese and well appearing   HENT:  Head: Normocephalic and atraumatic.  Mouth/Throat: Oropharynx is clear and moist.  Eyes: Conjunctivae and EOM are normal. Pupils are equal, round, and reactive to light.  Neck: Normal range of motion. Neck supple.  Cardiovascular: Normal rate and regular rhythm.   Musculoskeletal: She exhibits edema and tenderness.    L knee- mild soft tissue swelling laterally  Tender over patellar tendon and L joint line No detectable effusion Some crepitus Flex 90 deg with pain  Pos mc murray test Neg drawer/ lachman Neg patellar grind  Neurological: She is alert. She has normal reflexes. She displays no atrophy. No sensory deficit. She exhibits normal muscle tone.  Skin: Skin is warm and dry.  No rash noted. No erythema. No pallor.  Psychiatric: She has a normal mood and affect.          Assessment & Plan:

## 2013-11-29 NOTE — Assessment & Plan Note (Signed)
With some external swelling and crepitus  Disc poss of OA or patellar tendonitis or meniscal cause Xray today meloxicam Ice and elevation  Then will make plan Aware that wt loss will help joint problems

## 2013-11-30 ENCOUNTER — Telehealth: Payer: Self-pay | Admitting: Family Medicine

## 2013-11-30 DIAGNOSIS — M25562 Pain in left knee: Secondary | ICD-10-CM

## 2013-11-30 NOTE — Telephone Encounter (Signed)
Referral done

## 2013-11-30 NOTE — Telephone Encounter (Signed)
Message copied by Judy Pimple on Mon Nov 30, 2013  1:44 PM ------      Message from: Shon Millet      Created: Mon Nov 30, 2013 12:16 PM       Pt notified of xray results and agrees with referral to ortho, pt would like to see a doc in [place], I advise pt Marion/Linda will call to schedule appt ------

## 2013-12-07 ENCOUNTER — Ambulatory Visit: Payer: Self-pay | Admitting: Family Medicine

## 2013-12-08 ENCOUNTER — Encounter: Payer: Self-pay | Admitting: Family Medicine

## 2013-12-18 DIAGNOSIS — M1710 Unilateral primary osteoarthritis, unspecified knee: Secondary | ICD-10-CM | POA: Diagnosis not present

## 2013-12-21 DIAGNOSIS — F314 Bipolar disorder, current episode depressed, severe, without psychotic features: Secondary | ICD-10-CM | POA: Diagnosis not present

## 2013-12-23 ENCOUNTER — Other Ambulatory Visit: Payer: Self-pay | Admitting: *Deleted

## 2013-12-23 NOTE — Telephone Encounter (Signed)
Received faxed refill request from mail order pharmacy. Last refill 11/27/13 #30/l, last office visit 11/27/13. Is it okay to refill medication?

## 2013-12-24 MED ORDER — MELOXICAM 15 MG PO TABS: 15.0000 mg | ORAL_TABLET | Freq: Every day | ORAL | Status: DC

## 2013-12-24 NOTE — Telephone Encounter (Signed)
Please refill for 6 months 

## 2013-12-24 NOTE — Telephone Encounter (Signed)
done

## 2013-12-24 NOTE — Telephone Encounter (Signed)
Yes-please refill for 6 mo , thanks

## 2014-01-28 DIAGNOSIS — F314 Bipolar disorder, current episode depressed, severe, without psychotic features: Secondary | ICD-10-CM | POA: Diagnosis not present

## 2014-02-19 ENCOUNTER — Telehealth: Payer: Self-pay | Admitting: Family Medicine

## 2014-02-19 ENCOUNTER — Emergency Department: Payer: Self-pay | Admitting: Emergency Medicine

## 2014-02-19 DIAGNOSIS — S6980XA Other specified injuries of unspecified wrist, hand and finger(s), initial encounter: Secondary | ICD-10-CM | POA: Diagnosis not present

## 2014-02-19 DIAGNOSIS — M79609 Pain in unspecified limb: Secondary | ICD-10-CM | POA: Diagnosis not present

## 2014-02-19 DIAGNOSIS — S6390XA Sprain of unspecified part of unspecified wrist and hand, initial encounter: Secondary | ICD-10-CM | POA: Diagnosis not present

## 2014-02-19 DIAGNOSIS — S6990XA Unspecified injury of unspecified wrist, hand and finger(s), initial encounter: Secondary | ICD-10-CM | POA: Diagnosis not present

## 2014-02-19 NOTE — Telephone Encounter (Signed)
Patient Information:  Caller Name: Delma  Phone: [phone]  Patient: [name] [name], [name] [name]  Gender: Female  DOB: 1956/05/03  Age: 58 Years  PCP: Loura Pardon [facility])  Pregnant: No  Office Follow Up:  Does the office need to follow up with this patient?: No  Instructions For The Office: N/A  RN Note:  Spoke with clinical staff in office prior to sending to ED per office policy. Was advised to proceed with sending pt to ED. Advised [facility] but pt plans to go to [place] as it is much closer for her.  Symptoms  Reason For Call & Symptoms: Injured smallest finger on L hand last night (hit it on dresser when she fell getting out of bed in middle of night to use bathrooom) Today it looks bent and swollen and unable to straighten. Mild bluish discoloration.  Reviewed Health History In EMR: Yes  Reviewed Medications In EMR: Yes  Reviewed Allergies In EMR: Yes  Reviewed Surgeries / Procedures: Yes  Date of Onset of Symptoms: 02/18/2014 OB / GYN:  LMP: Unknown  Guideline(s) Used:  Finger Injury  Disposition Per Guideline:   Go to ED Now (or to Office with PCP Approval)  Reason For Disposition Reached:   Looks like a broken bone or dislocated joint (e.g., crooked or deformed)  Advice Given:  N/A  Patient Will Follow Care Advice:  YES

## 2014-02-22 DIAGNOSIS — M75120 Complete rotator cuff tear or rupture of unspecified shoulder, not specified as traumatic: Secondary | ICD-10-CM | POA: Diagnosis not present

## 2014-02-26 DIAGNOSIS — M20009 Unspecified deformity of unspecified finger(s): Secondary | ICD-10-CM | POA: Diagnosis not present

## 2014-03-19 ENCOUNTER — Telehealth: Payer: Self-pay

## 2014-03-19 NOTE — Telephone Encounter (Signed)
Pt saw Dr Toy Care psychiatrist today and wants labs done here at [facility]; pt has written order from Dr Toy Care but pt request Dr Glori Bickers to say it is OK for pt to have CBC,CMP,lipid profile and [place] at [facility] lab. Pt said next week was OK. Pt request cb.

## 2014-03-20 NOTE — Telephone Encounter (Signed)
If it is for another doctor outside of [place]- we cannot - she has to go to [facility] or [facility] [place]- to my understanding it is a legal issue

## 2014-03-22 NOTE — Telephone Encounter (Signed)
Pt notified and will go to a [facility]

## 2014-03-25 DIAGNOSIS — F314 Bipolar disorder, current episode depressed, severe, without psychotic features: Secondary | ICD-10-CM | POA: Diagnosis not present

## 2014-03-26 DIAGNOSIS — M20009 Unspecified deformity of unspecified finger(s): Secondary | ICD-10-CM | POA: Diagnosis not present

## 2014-04-01 DIAGNOSIS — M20009 Unspecified deformity of unspecified finger(s): Secondary | ICD-10-CM | POA: Diagnosis not present

## 2014-04-16 ENCOUNTER — Encounter (HOSPITAL_BASED_OUTPATIENT_CLINIC_OR_DEPARTMENT_OTHER): Payer: Self-pay | Admitting: *Deleted

## 2014-04-16 ENCOUNTER — Other Ambulatory Visit: Payer: Self-pay | Admitting: Family Medicine

## 2014-04-16 DIAGNOSIS — M20019 Mallet finger of unspecified finger(s): Secondary | ICD-10-CM | POA: Diagnosis not present

## 2014-04-19 ENCOUNTER — Encounter (HOSPITAL_BASED_OUTPATIENT_CLINIC_OR_DEPARTMENT_OTHER): Payer: Self-pay | Admitting: *Deleted

## 2014-04-19 NOTE — Progress Notes (Signed)
NPO AFTER MN. ARRIVE AT 1100.  NEEDS HG.  WILL TAKE WELLBUTRIN, SYNTHROID, AND PREVACID AM DOS W/ SIPS OF WATER.

## 2014-04-21 NOTE — H&P (Signed)
[name] [name] is an 58 y.o. female.   Chief Complaint: left small finger soft tissue deformity HPI: Pt followed in office for left small finger mallet, Pt has not responded to splinting Pt here for surgery for left small finger  Past Medical History  Diagnosis Date  . Diverticulosis of colon   . Hyperlipidemia   . Liver cyst   . GERD (gastroesophageal reflux disease)   . IBS (irritable bowel syndrome)   . Bipolar 1 disorder   . Hypothyroidism   . Allergic rhinitis   . History of basal cell carcinoma excision   . History of squamous cell carcinoma excision   . Mallet finger of left hand     small finger    Past Surgical History  Procedure Laterality Date  . Colonoscopy  11/2002  . Laparoscopic cholecystectomy  03-18-2000  . Cardiovascular stress test  02-23-2011  dr Chrissie Noa [facility])    normal nuclear study/  no ischemia/  ef 64%  . Transthoracic echocardiogram  02-23-2011    normal lvf/  ef 58%/  mild rve/   mild lae/  mild  mr  & tr/  mild pulmonary htn  . Cardiac catheterization  04/1999    per pt normal  . Cesarean section  1984  . Cataract extraction w/ intraocular lens  implant, bilateral  2003    Family History  Problem Relation Age of Onset  . Stroke Mother   . Heart disease Mother   . Hypertension Father   . Diabetes Father   . Hyperlipidemia Father    Social History:  reports that she has never smoked. She has never used smokeless tobacco. She reports that she does not drink alcohol or use illicit drugs.  Allergies:  Allergies  Allergen Reactions  . Codeine Nausea And Vomiting    REACTION: vomits blood  . Lamictal [Lamotrigine] Other (See Comments)    REACTION: sores in mouth, muscle aches and also made her manic bipolar  . Lithium Swelling  . Tegretol [Carbamazepine] Other (See Comments)    REACTION: skin crawls  . Xylocaine [Lidocaine Hcl] Swelling    No prescriptions prior to admission    No results found for this or any  previous visit (from the past 48 hour(s)). No results found.  ROS NO RECENT ILLNESSES OR HOSPITALIZATIONS  Height 5\' 1"  (1.549 m), weight 105.235 kg (232 lb). Physical Exam  General Appearance:  Alert, cooperative, no distress, appears stated age  Head:  Normocephalic, without obvious abnormality, atraumatic  Eyes:  Pupils equal, conjunctiva/corneas clear,         Throat: Lips, mucosa, and tongue normal; teeth and gums normal  Neck: No visible masses     Lungs:   respirations unlabored  Chest Wall:  No tenderness or deformity  Heart:  Regular rate and rhythm,  Abdomen:   Soft, non-tender,         Extremities: LEFT SMALL FINGER MALLET DEFORMITY NO INJURY TO LONG/RING/INDEX/THUMB GOOD DIGITAL MOBILITY GOOD WRIST AND FOREARM ROTATION  Pulses: 2+ and symmetric  Skin: Skin color, texture, turgor normal, no rashes or lesions     Neurologic: Normal    Assessment/Plan LEFT SMALL FINGER SOFT TISSUE MALLET, FAILED SPLINTING  LEFT SMALL FINGER CLOSED REDUCTION AND PINNING  R/B/A DISCUSSED WITH PT IN OFFICE.  PT VOICED UNDERSTANDING OF PLAN CONSENT SIGNED DAY OF SURGERY PT SEEN AND EXAMINED PRIOR TO OPERATIVE PROCEDURE/DAY OF SURGERY SITE MARKED. QUESTIONS ANSWERED WILL [facility] FOLLOWING SURGERY  Janelle Floor [facility] 04/22/2014 @  1315 

## 2014-04-21 NOTE — Brief Op Note (Signed)
04/22/2014  4:06 PM  PATIENT:  [name] [name]  58 y.o. female  PRE-OPERATIVE DIAGNOSIS:  LEFT SMALL MALLET FINGER  POST-OPERATIVE DIAGNOSIS:  SAME  PROCEDURE:  Procedure(s): LEFT SMALL FINGER CLOSED REDUCTION PINNING (Left)  SURGEON:  Surgeon(s) and Role:    * Linna Hoff, MD - Primary  PHYSICIAN ASSISTANT:   ASSISTANTS: none   ANESTHESIA:   general  EBL:     BLOOD ADMINISTERED:none  DRAINS: none   LOCAL MEDICATIONS USED:  MARCAINE     SPECIMEN:  No Specimen  DISPOSITION OF SPECIMEN:  N/A  COUNTS:  YES  TOURNIQUET:  * No tourniquets in log *  DICTATION: .242683  PLAN OF CARE: Discharge to home after PACU  PATIENT DISPOSITION:  PACU - hemodynamically stable.   Delay start of Pharmacological VTE agent (>24hrs) due to surgical blood loss or risk of bleeding: not applicable

## 2014-04-22 ENCOUNTER — Encounter (HOSPITAL_BASED_OUTPATIENT_CLINIC_OR_DEPARTMENT_OTHER): Payer: Self-pay

## 2014-04-22 ENCOUNTER — Ambulatory Visit (HOSPITAL_BASED_OUTPATIENT_CLINIC_OR_DEPARTMENT_OTHER)
Admission: RE | Admit: 2014-04-22 | Discharge: 2014-04-22 | Disposition: A | Discharge status: Home / Self Care | Payer: 59 | Source: Ambulatory Visit | Attending: Orthopedic Surgery | Admitting: Orthopedic Surgery

## 2014-04-22 ENCOUNTER — Encounter (HOSPITAL_BASED_OUTPATIENT_CLINIC_OR_DEPARTMENT_OTHER)
Admission: RE | Disposition: A | Discharge status: Home / Self Care | Payer: Self-pay | Source: Ambulatory Visit | Attending: Orthopedic Surgery

## 2014-04-22 ENCOUNTER — Encounter (HOSPITAL_BASED_OUTPATIENT_CLINIC_OR_DEPARTMENT_OTHER): Payer: 59 | Admitting: Anesthesiology

## 2014-04-22 ENCOUNTER — Ambulatory Visit (HOSPITAL_BASED_OUTPATIENT_CLINIC_OR_DEPARTMENT_OTHER): Payer: 59 | Admitting: Anesthesiology

## 2014-04-22 DIAGNOSIS — K589 Irritable bowel syndrome without diarrhea: Secondary | ICD-10-CM | POA: Insufficient documentation

## 2014-04-22 DIAGNOSIS — F319 Bipolar disorder, unspecified: Secondary | ICD-10-CM | POA: Diagnosis not present

## 2014-04-22 DIAGNOSIS — E039 Hypothyroidism, unspecified: Secondary | ICD-10-CM | POA: Insufficient documentation

## 2014-04-22 DIAGNOSIS — Z885 Allergy status to narcotic agent status: Secondary | ICD-10-CM | POA: Insufficient documentation

## 2014-04-22 DIAGNOSIS — J309 Allergic rhinitis, unspecified: Secondary | ICD-10-CM | POA: Diagnosis not present

## 2014-04-22 DIAGNOSIS — K219 Gastro-esophageal reflux disease without esophagitis: Secondary | ICD-10-CM | POA: Insufficient documentation

## 2014-04-22 DIAGNOSIS — Z888 Allergy status to other drugs, medicaments and biological substances status: Secondary | ICD-10-CM | POA: Insufficient documentation

## 2014-04-22 DIAGNOSIS — K5730 Diverticulosis of large intestine without perforation or abscess without bleeding: Secondary | ICD-10-CM | POA: Diagnosis not present

## 2014-04-22 DIAGNOSIS — E785 Hyperlipidemia, unspecified: Secondary | ICD-10-CM | POA: Diagnosis not present

## 2014-04-22 DIAGNOSIS — M20019 Mallet finger of unspecified finger(s): Secondary | ICD-10-CM | POA: Diagnosis not present

## 2014-04-22 DIAGNOSIS — M20012 Mallet finger of left finger(s): Secondary | ICD-10-CM

## 2014-04-22 HISTORY — DX: Personal history of malignant neoplasm, unspecified: Z85.9

## 2014-04-22 HISTORY — DX: Other specified postprocedural states: Z85.828

## 2014-04-22 HISTORY — DX: Hypothyroidism, unspecified: E03.9

## 2014-04-22 HISTORY — DX: Allergic rhinitis, unspecified: J30.9

## 2014-04-22 HISTORY — DX: Other specified postprocedural states: Z98.890

## 2014-04-22 HISTORY — DX: Other specified postprocedural states: Z85.9

## 2014-04-22 HISTORY — DX: Mallet finger of left finger(s): M20.012

## 2014-04-22 HISTORY — PX: CLOSED REDUCTION FINGER WITH PERCUTANEOUS PINNING: SHX5612

## 2014-04-22 LAB — POCT HEMOGLOBIN-HEMACUE: HEMOGLOBIN: 13.2 g/dL (ref 12.0–15.0)

## 2014-04-22 SURGERY — CLOSED REDUCTION, FINGER, WITH PERCUTANEOUS PINNING
Anesthesia: Monitor Anesthesia Care | Site: Finger | Laterality: Left

## 2014-04-22 MED ORDER — DOCUSATE SODIUM 100 MG PO CAPS: 100.0000 mg | ORAL_CAPSULE | Freq: Two times a day (BID) | ORAL | Status: DC

## 2014-04-22 MED ORDER — PROPOFOL 10 MG/ML IV EMUL
INTRAVENOUS | Status: DC | PRN
  Administered 2014-04-22: 75 ug/kg/min via INTRAVENOUS

## 2014-04-22 MED ORDER — OXYCODONE HCL 5 MG PO TABS
5.0000 mg | ORAL_TABLET | Freq: Once | ORAL | Status: DC | PRN
  Filled 2014-04-22: qty 1

## 2014-04-22 MED ORDER — MEPERIDINE HCL 25 MG/ML IJ SOLN
6.2500 mg | INTRAMUSCULAR | Status: DC | PRN
  Filled 2014-04-22: qty 1

## 2014-04-22 MED ORDER — CHLORHEXIDINE GLUCONATE 4 % EX LIQD
60.0000 mL | Freq: Once | CUTANEOUS | Status: DC
  Filled 2014-04-22: qty 60

## 2014-04-22 MED ORDER — PROPOFOL 10 MG/ML IV BOLUS
INTRAVENOUS | Status: DC | PRN
  Administered 2014-04-22: 20 mg via INTRAVENOUS

## 2014-04-22 MED ORDER — BUPIVACAINE HCL (PF) 0.25 % IJ SOLN
INTRAMUSCULAR | Status: DC | PRN
  Administered 2014-04-22: 9 mL

## 2014-04-22 MED ORDER — ONDANSETRON HCL 4 MG/2ML IJ SOLN
INTRAMUSCULAR | Status: DC | PRN
  Administered 2014-04-22: 4 mg via INTRAVENOUS

## 2014-04-22 MED ORDER — LACTATED RINGERS IV SOLN
INTRAVENOUS | Status: DC
  Administered 2014-04-22: 12:00:00 via INTRAVENOUS
  Filled 2014-04-22: qty 1000

## 2014-04-22 MED ORDER — MIDAZOLAM HCL 2 MG/2ML IJ SOLN
INTRAMUSCULAR | Status: AC
  Filled 2014-04-22: qty 2

## 2014-04-22 MED ORDER — FENTANYL CITRATE 0.05 MG/ML IJ SOLN
INTRAMUSCULAR | Status: DC | PRN
Start: 2014-04-22 — End: 2014-04-22
  Administered 2014-04-22: 50 ug via INTRAVENOUS

## 2014-04-22 MED ORDER — MIDAZOLAM HCL 5 MG/5ML IJ SOLN
INTRAMUSCULAR | Status: DC | PRN
  Administered 2014-04-22: 2 mg via INTRAVENOUS

## 2014-04-22 MED ORDER — HYDROCODONE-ACETAMINOPHEN 5-300 MG PO TABS: 1.0000 | ORAL_TABLET | Freq: Four times a day (QID) | ORAL | Status: DC | PRN

## 2014-04-22 MED ORDER — OXYCODONE HCL 5 MG/5ML PO SOLN
5.0000 mg | Freq: Once | ORAL | Status: DC | PRN
  Filled 2014-04-22: qty 5

## 2014-04-22 MED ORDER — FENTANYL CITRATE 0.05 MG/ML IJ SOLN
INTRAMUSCULAR | Status: AC
  Filled 2014-04-22: qty 4

## 2014-04-22 MED ORDER — PROMETHAZINE HCL 25 MG/ML IJ SOLN
6.2500 mg | INTRAMUSCULAR | Status: DC | PRN
  Filled 2014-04-22: qty 1

## 2014-04-22 MED ORDER — HYDROMORPHONE HCL PF 1 MG/ML IJ SOLN
0.2500 mg | INTRAMUSCULAR | Status: DC | PRN
  Filled 2014-04-22: qty 1

## 2014-04-22 MED ORDER — CEFAZOLIN SODIUM-DEXTROSE 2-3 GM-% IV SOLR
2.0000 g | INTRAVENOUS | Status: AC
  Administered 2014-04-22: 2 g via INTRAVENOUS
  Filled 2014-04-22: qty 50

## 2014-04-22 SURGICAL SUPPLY — 64 items
BANDAGE CONFORM 3  STR LF (GAUZE/BANDAGES/DRESSINGS) IMPLANT
BANDAGE ELASTIC 3 VELCRO ST LF (GAUZE/BANDAGES/DRESSINGS) IMPLANT
BANDAGE ELASTIC 4 VELCRO ST LF (GAUZE/BANDAGES/DRESSINGS) IMPLANT
BANDAGE GAUZE ELAST BULKY 4 IN (GAUZE/BANDAGES/DRESSINGS) IMPLANT
BENZOIN TINCTURE PRP APPL 2/3 (GAUZE/BANDAGES/DRESSINGS) IMPLANT
BLADE SURG 15 STRL LF DISP TIS (BLADE) IMPLANT
BLADE SURG 15 STRL SS (BLADE)
BNDG COHESIVE 1X5 TAN STRL LF (GAUZE/BANDAGES/DRESSINGS) ×3 IMPLANT
BNDG COHESIVE 3X5 TAN STRL LF (GAUZE/BANDAGES/DRESSINGS) IMPLANT
BNDG CONFORM 2 STRL LF (GAUZE/BANDAGES/DRESSINGS) ×3 IMPLANT
BNDG ESMARK 4X9 LF (GAUZE/BANDAGES/DRESSINGS) IMPLANT
BNDG GAUZE ELAST 4 BULKY (GAUZE/BANDAGES/DRESSINGS) IMPLANT
CLOSURE WOUND 1/2 X4 (GAUZE/BANDAGES/DRESSINGS)
CLOTH BEACON ORANGE TIMEOUT ST (SAFETY) ×3 IMPLANT
CORDS BIPOLAR (ELECTRODE) IMPLANT
COVER TABLE BACK 60X90 (DRAPES) ×3 IMPLANT
CUFF TOURN SGL QUICK 18 (TOURNIQUET CUFF) ×3 IMPLANT
CUFF TOURNIQUET SINGLE 18IN (TOURNIQUET CUFF) ×3 IMPLANT
CUFF TOURNIQUET SINGLE 24IN (TOURNIQUET CUFF) IMPLANT
DRAIN PENROSE 18X1/4 LTX STRL (WOUND CARE) IMPLANT
DRAPE EXTREMITY T 121X128X90 (DRAPE) ×3 IMPLANT
DRAPE OEC MINIVIEW 54X84 (DRAPES) ×3 IMPLANT
DRAPE SURG 17X23 STRL (DRAPES) IMPLANT
DRSG EMULSION OIL 3X3 NADH (GAUZE/BANDAGES/DRESSINGS) IMPLANT
ELECT NEEDLE TIP 2.8 STRL (NEEDLE) IMPLANT
ELECT REM PT RETURN 9FT ADLT (ELECTROSURGICAL)
ELECTRODE REM PT RTRN 9FT ADLT (ELECTROSURGICAL) IMPLANT
GAUZE SPONGE 4X4 16PLY XRAY LF (GAUZE/BANDAGES/DRESSINGS) IMPLANT
GAUZE XEROFORM 1X8 LF (GAUZE/BANDAGES/DRESSINGS) ×3 IMPLANT
GLOVE BIO SURGEON STRL SZ8 (GLOVE) ×3 IMPLANT
GLOVE BIOGEL M STER SZ 6 (GLOVE) ×3 IMPLANT
GLOVE BIOGEL PI IND STRL 6.5 (GLOVE) ×1 IMPLANT
GLOVE BIOGEL PI IND STRL 7.5 (GLOVE) ×1 IMPLANT
GLOVE BIOGEL PI INDICATOR 6.5 (GLOVE) ×2
GLOVE BIOGEL PI INDICATOR 7.5 (GLOVE) ×2
GOWN STRL REUS W/TWL LRG LVL3 (GOWN DISPOSABLE) ×3 IMPLANT
GOWN STRL REUS W/TWL XL LVL3 (GOWN DISPOSABLE) ×3 IMPLANT
K-WIRE .035X4 (WIRE) IMPLANT
K-WIRE .045X4 (WIRE) ×3 IMPLANT
LOOP VESSEL MAXI BLUE (MISCELLANEOUS) IMPLANT
NEEDLE HYPO 25X1 1.5 SAFETY (NEEDLE) ×3 IMPLANT
NS IRRIG 500ML POUR BTL (IV SOLUTION) ×3 IMPLANT
PACK BASIN DAY SURGERY FS (CUSTOM PROCEDURE TRAY) ×3 IMPLANT
PAD CAST 3X4 CTTN HI CHSV (CAST SUPPLIES) IMPLANT
PADDING CAST ABS 4INX4YD NS (CAST SUPPLIES)
PADDING CAST ABS COTTON 4X4 ST (CAST SUPPLIES) IMPLANT
PADDING CAST COTTON 3X4 STRL (CAST SUPPLIES)
PENCIL BUTTON HOLSTER BLD 10FT (ELECTRODE) IMPLANT
SPLINT PLASTER CAST XFAST 3X15 (CAST SUPPLIES) IMPLANT
SPLINT PLASTER CAST XFAST 4X15 (CAST SUPPLIES) IMPLANT
SPLINT PLASTER XTRA FAST SET 4 (CAST SUPPLIES)
SPLINT PLASTER XTRA FASTSET 3X (CAST SUPPLIES)
SPONGE GAUZE 4X4 12PLY (GAUZE/BANDAGES/DRESSINGS) IMPLANT
STOCKINETTE 4X48 STRL (DRAPES) ×3 IMPLANT
STRIP CLOSURE SKIN 1/2X4 (GAUZE/BANDAGES/DRESSINGS) IMPLANT
SUT ETHILON 4 0 P 3 18 (SUTURE) IMPLANT
SUT ETHILON 5 0 P 3 18 (SUTURE)
SUT NYLON ETHILON 5-0 P-3 1X18 (SUTURE) IMPLANT
SYR BULB 3OZ (MISCELLANEOUS) IMPLANT
SYR CONTROL 10ML LL (SYRINGE) IMPLANT
TOWEL OR 17X24 6PK STRL BLUE (TOWEL DISPOSABLE) ×6 IMPLANT
UNDERPAD 30X30 INCONTINENT (UNDERPADS AND DIAPERS) ×3 IMPLANT
WATER STERILE IRR 500ML POUR (IV SOLUTION) IMPLANT
k-wire ×3 IMPLANT

## 2014-04-22 NOTE — Anesthesia Postprocedure Evaluation (Signed)
Anesthesia Post Note  Patient: [name] [name]  Procedure(s) Performed: Procedure(s) (LRB): LEFT SMALL FINGER CLOSED REDUCTION PINNING (Left)  Anesthesia type: MAC  Patient location: PACU  Post pain: Pain level controlled  Post assessment: Post-op Vital signs reviewed  Last Vitals: BP 128/75  Pulse 59  Temp(Src) 36.2 C (Oral)  Resp 16  Ht 5\' 1"  (1.549 m)  Wt 234 lb (106.142 kg)  BMI 44.24 kg/m2  SpO2 100%  Post vital signs: Reviewed  Level of consciousness: awake  Complications: No apparent anesthesia complications

## 2014-04-22 NOTE — Discharge Instructions (Signed)
KEEP BANDAGE CLEAN AND DRY CALL OFFICE FOR F/U APPT [phone] in 12 days KEEP HAND ELEVATED ABOVE HEART OK TO APPLY ICE TO OPERATIVE AREA CONTACT OFFICE IF ANY WORSENING PAIN OR CONCERNS.  Post Anesthesia Home Care Instructions  Activity: Get plenty of rest for the remainder of the day. A responsible adult should stay with you for 24 hours following the procedure.  For the next 24 hours, DO NOT: -Drive a car -Paediatric nurse -Drink alcoholic beverages -Take any medication unless instructed by your physician -Make any legal decisions or sign important papers.  Meals: Start with liquid foods such as gelatin or soup. Progress to regular foods as tolerated. Avoid greasy, spicy, heavy foods. If nausea and/or vomiting occur, drink only clear liquids until the nausea and/or vomiting subsides. Call your physician if vomiting continues.  Special Instructions/Symptoms: Your throat may feel dry or sore from the anesthesia or the breathing tube placed in your throat during surgery. If this causes discomfort, gargle with warm salt water. The discomfort should disappear within 24 hours.        HAND SURGERY    HOME CARE INSTRUCTIONS    The following instructions have been prepared to help you care for yourself upon your return home today.  Wound Care:  Keep your hand elevated above the level of your heart. Do not allow it to dangle by your side. Keep the dressing dry and do not remove it unless your doctor advises you to do so. He will usually change it at the time of you post-op visit. Moving your fingers is advised to stimulate circulation but will depend on the site of your surgery. Of course, if you have a splint applied your doctor will advise you about movement.  Activity:  Do not drive or operate machinery today. Rest today and then you may return to your normal activity and work as indicated by your physician.  Diet: Drink liquids today or eat a light diet. You may resume a regular  diet tomorrow.  General expectations: Pain for two or three days. Fingers may become slightly swollen.   Unexpected Observations- Call your doctor if any of these occur: Severe pain not relieved by pain medication. Elevated temperature. Dressing soaked with blood. Inability to move fingers. White or bluish color to fingers.

## 2014-04-22 NOTE — Transfer of Care (Signed)
Immediate Anesthesia Transfer of Care Note  Patient: [name] [name]  Procedure(s) Performed: Procedure(s): LEFT SMALL FINGER CLOSED REDUCTION PINNING (Left)  Patient Location: PACU  Anesthesia Type:MAC  Level of Consciousness: awake, alert , oriented and patient cooperative  Airway & Oxygen Therapy: Patient Spontanous Breathing and Patient connected to nasal cannula oxygen  Post-op Assessment: Report given to PACU RN and Post -op Vital signs reviewed and stable  Post vital signs: Reviewed and stable  Complications: No apparent anesthesia complications

## 2014-04-22 NOTE — Anesthesia Procedure Notes (Signed)
Procedure Name: MAC Date/Time: 04/22/2014 1:30 PM Performed by: Wanita Chamberlain Pre-anesthesia Checklist: Patient identified, Timeout performed, Emergency Drugs available, Suction available and Patient being monitored Oxygen Delivery Method: Nasal cannula Intubation Type: IV induction Dental Injury: Teeth and Oropharynx as per pre-operative assessment

## 2014-04-22 NOTE — Anesthesia Preprocedure Evaluation (Addendum)
Anesthesia Evaluation  Patient identified by MRN, date of birth, ID band Patient awake    Reviewed: Allergy & Precautions, H&P , NPO status , Patient's Chart, lab work & pertinent test results  Airway Mallampati: II TM Distance: >3 FB Neck ROM: Full    Dental  (+) Dental Advisory Given, Teeth Intact   Pulmonary neg pulmonary ROS,  breath sounds clear to auscultation- rhonchi        Cardiovascular negative cardio ROS  Rhythm:Regular Rate:Normal     Neuro/Psych PSYCHIATRIC DISORDERS Bipolar Disorder negative neurological ROS     GI/Hepatic Neg liver ROS, GERD-  ,  Endo/Other  Hypothyroidism Morbid obesity  Renal/GU negative Renal ROS     Musculoskeletal negative musculoskeletal ROS (+)   Abdominal (+) + obese,   Peds  Hematology negative hematology ROS (+)   Anesthesia Other Findings Pt states that she had an allergy test that confirmed her local/lidocaine/carbocaine allergy. ( Facial swelling and seizures) She stated that she can tolerate Marcaine as she had it when she got a tooth crowned.  Reproductive/Obstetrics negative OB ROS                         Anesthesia Physical Anesthesia Plan  ASA: III  Anesthesia Plan: MAC   Post-op Pain Management:    Induction: Intravenous  Airway Management Planned:   Additional Equipment:   Intra-op Plan:   Post-operative Plan:   Informed Consent: I have reviewed the patients History and Physical, chart, labs and discussed the procedure including the risks, benefits and alternatives for the proposed anesthesia with the patient or authorized representative who has indicated his/her understanding and acceptance.   Dental advisory given  Plan Discussed with: CRNA and Anesthesiologist  Anesthesia Plan Comments:        Anesthesia Quick Evaluation

## 2014-04-23 ENCOUNTER — Encounter (HOSPITAL_BASED_OUTPATIENT_CLINIC_OR_DEPARTMENT_OTHER): Payer: Self-pay | Admitting: Orthopedic Surgery

## 2014-04-26 NOTE — Op Note (Signed)
NAME:  [name] [name], [name] [name] NO.:  [id number]  MEDICAL RECORD NO.:  [id number]  LOCATION:                                 FACILITY:  PHYSICIAN:  Melrose Nakayama, MD  DATE OF BIRTH:  09/15/1956  DATE OF PROCEDURE:  04/22/2014 DATE OF DISCHARGE:                              OPERATIVE REPORT   PREOPERATIVE DIAGNOSIS:  Left small finger mallet finger.  POSTOPERATIVE DIAGNOSIS:  Left small finger mallet finger.  SURGICAL PROCEDURE: 1. Percutaneous skeletal fixation of left small finger mallet. 2. Radiographs 2 views, left small finger.  SURGICAL IMPLANTS:  One 0.045 K-wire.  SURGICAL INDICATIONS:  Ms. __________ is a 58 year old right-hand- dominant female who sustained a soft tissue mallet.  The patient had not responded to splinting.  The patient would like to undergo the above procedure.  Risks, benefits, and alternatives were discussed in detail with the patient and signed informed consent was obtained.  Risks include, but not limited to bleeding, infection; damage to nearby nerves, arteries, or tendons; loss of motion to wrist and digits, incomplete relief of symptoms, and need for further surgical intervention.  ANESTHESIA:  0.25% Marcaine local block with IV sedation.  DESCRIPTION OF PROCEDURE:  The patient was properly identified in the preoperative holding area and a mark with a permanent marker made on the left small finger to indicate the correct operative site.  The patient was then brought back to the operating room, placed supine on the operating room table where the IV sedation was administered.  0.25% Marcaine flexor sheath block was then performed.  The patient tolerated this well.  The left upper extremity was then prepped and draped in normal sterile fashion.  Time-out was called.  Correct site was identified, and procedure then begun.  Attention was then turned to left small finger where after adequate anesthesia, the finger was then brought  into full extension.  Following this, a 0.045 K-wire was then placed retrograde across the distal interphalangeal joint with good purchase.  K-wire was then cut beneath the surface.  Sterile compressive bandage was then applied.  The patient tolerated the procedure well, and taken to the recovery room in good condition.  Intraoperative radiographs 2 views of the finger did show the K-wire fixation in place in good position.  POSTPROCEDURAL PLAN:  The patient discharged to home, seen back in the office in approximately 2 weeks and in 10 days x-rays and then pin in for a total of 8 weeks, and then we will take the pin out at 8-week mark and then begin some increased flexion and use of the finger. Radiographs at each visit.     Melrose Nakayama, MD     FWO/MEDQ  D:  04/22/2014  T:  04/23/2014  Job:  [id number]

## 2014-05-07 DIAGNOSIS — M20019 Mallet finger of unspecified finger(s): Secondary | ICD-10-CM | POA: Diagnosis not present

## 2014-05-13 DIAGNOSIS — Z85828 Personal history of other malignant neoplasm of skin: Secondary | ICD-10-CM | POA: Diagnosis not present

## 2014-05-13 DIAGNOSIS — L820 Inflamed seborrheic keratosis: Secondary | ICD-10-CM | POA: Diagnosis not present

## 2014-05-13 DIAGNOSIS — L719 Rosacea, unspecified: Secondary | ICD-10-CM | POA: Diagnosis not present

## 2014-05-14 DIAGNOSIS — E785 Hyperlipidemia, unspecified: Secondary | ICD-10-CM | POA: Diagnosis not present

## 2014-05-14 DIAGNOSIS — F329 Major depressive disorder, single episode, unspecified: Secondary | ICD-10-CM | POA: Diagnosis not present

## 2014-05-14 DIAGNOSIS — F3289 Other specified depressive episodes: Secondary | ICD-10-CM | POA: Diagnosis not present

## 2014-05-27 DIAGNOSIS — F314 Bipolar disorder, current episode depressed, severe, without psychotic features: Secondary | ICD-10-CM | POA: Diagnosis not present

## 2014-05-28 DIAGNOSIS — M20019 Mallet finger of unspecified finger(s): Secondary | ICD-10-CM | POA: Diagnosis not present

## 2014-06-20 ENCOUNTER — Emergency Department: Payer: Self-pay | Admitting: Emergency Medicine

## 2014-06-20 DIAGNOSIS — R51 Headache: Secondary | ICD-10-CM | POA: Diagnosis not present

## 2014-06-20 DIAGNOSIS — R52 Pain, unspecified: Secondary | ICD-10-CM | POA: Diagnosis not present

## 2014-06-20 LAB — CBC
HCT: 39.7 % (ref 35.0–47.0)
HGB: 13.1 g/dL (ref 12.0–16.0)
MCH: 29.3 pg (ref 26.0–34.0)
MCHC: 32.9 g/dL (ref 32.0–36.0)
MCV: 89 fL (ref 80–100)
Platelet: 175 10*3/uL (ref 150–440)
RBC: 4.47 10*6/uL (ref 3.80–5.20)
RDW: 13.4 % (ref 11.5–14.5)
WBC: 4.9 10*3/uL (ref 3.6–11.0)

## 2014-06-20 LAB — BASIC METABOLIC PANEL
ANION GAP: 5 — AB (ref 7–16)
BUN: 15 mg/dL (ref 7–18)
CREATININE: 0.99 mg/dL (ref 0.60–1.30)
Calcium, Total: 8.6 mg/dL (ref 8.5–10.1)
Chloride: 107 mmol/L (ref 98–107)
Co2: 30 mmol/L (ref 21–32)
EGFR (Non-African Amer.): >60
Glucose: 81 mg/dL (ref 65–99)
OSMOLALITY: 283 (ref 275–301)
POTASSIUM: 4.8 mmol/L (ref 3.5–5.1)
Sodium: 142 mmol/L (ref 136–145)

## 2014-06-20 LAB — MAGNESIUM: Magnesium: 1.8 mg/dL

## 2014-06-29 ENCOUNTER — Encounter: Payer: Self-pay | Admitting: Family Medicine

## 2014-06-29 ENCOUNTER — Ambulatory Visit (INDEPENDENT_AMBULATORY_CARE_PROVIDER_SITE_OTHER): Payer: 59 | Admitting: Family Medicine

## 2014-06-29 VITALS — BP 132/72 | HR 65 | Temp 98.1°F | Ht 61.0 in | Wt 227.5 lb

## 2014-06-29 DIAGNOSIS — B0221 Postherpetic geniculate ganglionitis: Secondary | ICD-10-CM

## 2014-06-29 NOTE — Patient Instructions (Signed)
Take care of yourself  I'm glad your shingles pain is better - finish your medicines  Please stop up front to send to [facility] for your ER records  Let me know if any problems

## 2014-06-29 NOTE — Assessment & Plan Note (Addendum)
Sent to [facility] for her er records  Doing very well after a course of valtrex and prednisone  No more pain and never did get rash (of note -had the zostavax also in the past) Will update if symptoms return Reviewed materials that she brought from the ER in detail

## 2014-06-29 NOTE — Progress Notes (Signed)
Pre visit review using our clinic review tool, if applicable. No additional management support is needed unless otherwise documented below in the visit note. 

## 2014-06-29 NOTE — Progress Notes (Signed)
Subjective:    Patient ID: [name] [name], female    DOB: [birth year], 58 y.o.   MRN: [id number]  HPI Here for f/u after visit to [facility] for headache- dx with ramsay hunt syndrome type 2  Was on her way back from [place] in [place] and had severe headache post L side -sharp in nature and throbbing  Went to [facility] Dx with shingles w/o rash Given high dose valtrex and prednisone   (CT scan and labs nl and gave her abx also - ? What it was)  Never got the rash Her vision and hearing were briefly affected  Is a lot better now   Of note-no hx of migraine   Took about 3 days to start improving  No pain at all now  She had the shingles vaccine 2012   Lost 15 lb on wt [employer]-really happy about it ! Working hard on that   Patient Active Problem List   Diagnosis Date Noted  . Ramsay Hunt syndrome (geniculate herpes zoster) 06/29/2014  . Left knee pain 11/27/2013  . Colon cancer screening 10/16/2013  . Left foot pain 06/18/2013  . Encounter for examination for insurance purposes 09/16/2012  . Right arm pain 05/13/2012  . ETD (eustachian tube dysfunction) 04/11/2012  . Cerumen impaction, right 04/11/2012  . Other screening mammogram 10/15/2011  . Encounter for Medicare annual wellness exam 10/04/2011  . Low back pain 09/17/2011  . Tinea corporis 09/05/2011  . Bartholin gland cyst 08/21/2011  . CHEST PAIN 01/29/2011  . NEOPLASM OF UNCERTAIN BEHAVIOR OF SKIN 10/04/2010  . METATARSALGIA 12/01/2009  . POSTMENOPAUSAL STATUS 09/30/2009  . ARTHRITIS, RIGHT HIP 03/10/2009  . DEGENERATIVE DISC DISEASE, LUMBAR SPINE 03/10/2009  . ONYCHOMYCOSIS 09/17/2008  . HYPOTHYROIDISM 08/05/2007  . OBESITY 08/05/2007  . BIPOLAR DISORDER UNSPECIFIED 08/05/2007  . ALLERGIC RHINITIS 08/05/2007  . GERD 08/05/2007  . IBS 08/05/2007  . HYPERCHOLESTEROLEMIA, PURE 07/07/2007   Past Medical History  Diagnosis Date  . Diverticulosis of colon   . Hyperlipidemia   . Liver cyst   . GERD  (gastroesophageal reflux disease)   . IBS (irritable bowel syndrome)   . Bipolar 1 disorder   . Hypothyroidism   . Allergic rhinitis   . History of basal cell carcinoma excision   . History of squamous cell carcinoma excision   . Mallet finger of left hand     small finger   Past Surgical History  Procedure Laterality Date  . Colonoscopy  11/2002  . Laparoscopic cholecystectomy  03-18-2000  . Cardiovascular stress test  02-23-2011  dr Chrissie Noa [facility])    normal nuclear study/  no ischemia/  ef 64%  . Transthoracic echocardiogram  02-23-2011    normal lvf/  ef 58%/  mild rve/   mild lae/  mild  mr  & tr/  mild pulmonary htn  . Cardiac catheterization  04/1999    per pt normal  . Cesarean section  1984  . Cataract extraction w/ intraocular lens  implant, bilateral  2003  . Closed reduction finger with percutaneous pinning Left 04/22/2014    Procedure: LEFT SMALL FINGER CLOSED REDUCTION PINNING;  Surgeon: Linna Hoff, MD;  Location: [facility];  Service: Orthopedics;  Laterality: Left;   History  Substance Use Topics  . Smoking status: Never Smoker   . Smokeless tobacco: Never Used  . Alcohol Use: No   Family History  Problem Relation Age of Onset  . Stroke Mother   .  Heart disease Mother   . Hypertension Father   . Diabetes Father   . Hyperlipidemia Father    Allergies  Allergen Reactions  . Codeine Nausea And Vomiting    REACTION: vomits blood  . Lamictal [Lamotrigine] Other (See Comments)    REACTION: sores in mouth, muscle aches and also made her manic bipolar  . Lithium Swelling  . Tegretol [Carbamazepine] Other (See Comments)    REACTION: skin crawls  . Xylocaine [Lidocaine Hcl] Swelling   Current Outpatient Prescriptions on File Prior to Visit  Medication Sig Dispense Refill  . ARIPiprazole (ABILIFY) 30 MG tablet Take 30 mg by mouth daily.        Marland Kitchen aspirin 81 MG tablet Take 81 mg by mouth daily.        Marland Kitchen buPROPion (WELLBUTRIN  XL) 150 MG 24 hr tablet Take two tablets by mouth every morning       . cetirizine (ZYRTEC) 10 MG tablet Take 10 mg by mouth daily.        . Cholecalciferol (VITAMIN D) 2000 UNITS CAPS Take 1 capsule by mouth daily.        . clonazePAM (KLONOPIN) 1 MG tablet Take 1 mg by mouth at bedtime as needed.        . fluticasone (FLONASE) 50 MCG/ACT nasal spray Use 2 sprays in each  nostril daily  48 g  1  . lansoprazole (PREVACID) 30 MG capsule Take 1 capsule by mouth  daily--  take in am      . levothyroxine (SYNTHROID, LEVOTHROID) 50 MCG tablet Take 1 tablet by mouth  daily--  takes in am      . lovastatin (MEVACOR) 40 MG tablet Take 1 tablet by mouth  daily  90 tablet  1  . Multiple Vitamin (MULTIVITAMIN) tablet Take 1 tablet by mouth daily.        Marland Kitchen olanzapine-fluoxetine (SYMBYAX) 3-25 MG per capsule Take 1 capsule by mouth at bedtime.        Marland Kitchen zolpidem (AMBIEN) 10 MG tablet Take 10 mg by mouth daily.         No current facility-administered medications on file prior to visit.     Review of Systems Review of Systems  Constitutional: Negative for fever, appetite change, fatigue and unexpected weight change.  Eyes: Negative for pain and visual disturbance.  Respiratory: Negative for cough and shortness of breath.   Cardiovascular: Negative for cp or palpitations    Gastrointestinal: Negative for nausea, diarrhea and constipation.  Genitourinary: Negative for urgency and frequency.  Skin: Negative for pallor or rash   Neurological: Negative for weakness, light-headedness, numbness and pos for headache that is now gone .  Hematological: Negative for adenopathy. Does not bruise/bleed easily.  Psychiatric/Behavioral: Negative for dysphoric mood. The patient is not nervous/anxious.         Objective:   Physical Exam  Constitutional: She appears well-developed and well-nourished. No distress.  obese and well appearing   HENT:  Head: Normocephalic and atraumatic.  Right Ear: External ear  normal.  Left Ear: External ear normal.  Nose: Nose normal.  Mouth/Throat: Oropharynx is clear and moist.  Eyes: Conjunctivae and EOM are normal. Pupils are equal, round, and reactive to light. Right eye exhibits no discharge. Left eye exhibits no discharge. No scleral icterus.  Neck: Normal range of motion. Neck supple. Carotid bruit is not present.  Cardiovascular: Normal rate, regular rhythm, normal heart sounds and intact distal pulses.   Pulmonary/Chest: Effort normal and breath  sounds normal. She has no rales.  Musculoskeletal: She exhibits no edema and no tenderness.  Lymphadenopathy:    She has no cervical adenopathy.  Neurological: She is alert. She has normal reflexes. She displays no atrophy. No cranial nerve deficit or sensory deficit. She exhibits normal muscle tone. Coordination and gait normal.  No facial droop or rash or tenderness   Skin: Skin is warm and dry. No rash noted. No erythema. No pallor.  Psychiatric: She has a normal mood and affect.          Assessment & Plan:   Problem List Items Addressed This Visit     Nervous and Auditory   Ramsay Hunt syndrome (geniculate herpes zoster) - Primary     Sent to [facility] for her er records  Doing very well after a course of valtrex and prednisone  No more pain and never did get rash (of note -had the zostavax also in the past) Will update if symptoms return    Relevant Medications      valACYclovir (VALTREX) 1000 MG tablet

## 2014-07-30 DIAGNOSIS — M20019 Mallet finger of unspecified finger(s): Secondary | ICD-10-CM | POA: Diagnosis not present

## 2014-08-02 ENCOUNTER — Telehealth: Payer: Self-pay

## 2014-08-02 NOTE — Telephone Encounter (Signed)
Pt left v/m; pt has mole on face that is growing rapidly; pt wants to know if Dr Glori Bickers thinks pt should see Dr Brendolyn Patty, dermatologist; pt has seen Dr Nicole Kindred before.Please advise.

## 2014-08-02 NOTE — Telephone Encounter (Signed)
Yes-I advise that - does she need a referral?

## 2014-08-02 NOTE — Telephone Encounter (Signed)
Patient advised.

## 2014-08-10 DIAGNOSIS — L820 Inflamed seborrheic keratosis: Secondary | ICD-10-CM | POA: Diagnosis not present

## 2014-08-10 DIAGNOSIS — L821 Other seborrheic keratosis: Secondary | ICD-10-CM | POA: Diagnosis not present

## 2014-08-10 DIAGNOSIS — L578 Other skin changes due to chronic exposure to nonionizing radiation: Secondary | ICD-10-CM | POA: Diagnosis not present

## 2014-08-19 DIAGNOSIS — F314 Bipolar disorder, current episode depressed, severe, without psychotic features: Secondary | ICD-10-CM | POA: Diagnosis not present

## 2014-09-13 DIAGNOSIS — F314 Bipolar disorder, current episode depressed, severe, without psychotic features: Secondary | ICD-10-CM | POA: Diagnosis not present

## 2014-09-24 ENCOUNTER — Other Ambulatory Visit: Payer: Self-pay | Admitting: Family Medicine

## 2014-09-24 NOTE — Telephone Encounter (Signed)
Pt left v/m requesting refill lansoprazole,levothyroxine and lovastatin to [facility]. Pt advised by v/m refill done and to get refills updated at 10/22/14 appt.

## 2014-10-18 ENCOUNTER — Telehealth: Payer: Self-pay | Admitting: Family Medicine

## 2014-10-18 DIAGNOSIS — K219 Gastro-esophageal reflux disease without esophagitis: Secondary | ICD-10-CM

## 2014-10-18 DIAGNOSIS — E7800 Pure hypercholesterolemia, unspecified: Secondary | ICD-10-CM

## 2014-10-18 DIAGNOSIS — Z0000 Encounter for general adult medical examination without abnormal findings: Secondary | ICD-10-CM

## 2014-10-18 DIAGNOSIS — E039 Hypothyroidism, unspecified: Secondary | ICD-10-CM

## 2014-10-18 NOTE — Telephone Encounter (Signed)
-----   Message from Ellamae Sia sent at 10/15/2014 10:11 AM EST ----- Regarding: Lab orders for Tuesday, 11.10.15 Patient is scheduled for CPX labs, please order future labs, Thanks , Karna Christmas

## 2014-10-19 ENCOUNTER — Other Ambulatory Visit (INDEPENDENT_AMBULATORY_CARE_PROVIDER_SITE_OTHER): Payer: 59

## 2014-10-19 DIAGNOSIS — K219 Gastro-esophageal reflux disease without esophagitis: Secondary | ICD-10-CM

## 2014-10-19 DIAGNOSIS — E039 Hypothyroidism, unspecified: Secondary | ICD-10-CM

## 2014-10-19 DIAGNOSIS — Z1211 Encounter for screening for malignant neoplasm of colon: Secondary | ICD-10-CM

## 2014-10-19 DIAGNOSIS — E7800 Pure hypercholesterolemia, unspecified: Secondary | ICD-10-CM

## 2014-10-20 LAB — CBC WITH DIFFERENTIAL
Basophils Absolute: 0 10*3/uL (ref 0.0–0.2)
Basos: 0 %
EOS ABS: 0 10*3/uL (ref 0.0–0.4)
Eos: 0 %
HEMATOCRIT: 41.6 % (ref 34.0–46.6)
Hemoglobin: 13.5 g/dL (ref 11.1–15.9)
IMMATURE GRANULOCYTES: 0 %
Immature Grans (Abs): 0 10*3/uL (ref 0.0–0.1)
Lymphocytes Absolute: 1.7 10*3/uL (ref 0.7–3.1)
Lymphs: 31 %
MCH: 29.3 pg (ref 26.6–33.0)
MCHC: 32.5 g/dL (ref 31.5–35.7)
MCV: 90 fL (ref 79–97)
Monocytes Absolute: 0.3 10*3/uL (ref 0.1–0.9)
Monocytes: 6 %
NEUTROS ABS: 3.4 10*3/uL (ref 1.4–7.0)
Neutrophils Relative %: 63 %
PLATELETS: 213 10*3/uL (ref 150–379)
RBC: 4.61 x10E6/uL (ref 3.77–5.28)
RDW: 13.5 % (ref 12.3–15.4)
WBC: 5.4 10*3/uL (ref 3.4–10.8)

## 2014-10-20 LAB — COMPREHENSIVE METABOLIC PANEL
ALBUMIN: 4.2 g/dL (ref 3.5–5.5)
ALT: 17 IU/L (ref 0–32)
AST: 21 IU/L (ref 0–40)
Albumin/Globulin Ratio: 1.8 (ref 1.1–2.5)
Alkaline Phosphatase: 88 IU/L (ref 39–117)
BUN/Creatinine Ratio: 9 (ref 9–23)
BUN: 11 mg/dL (ref 6–24)
CALCIUM: 9.5 mg/dL (ref 8.7–10.2)
CHLORIDE: 102 mmol/L (ref 97–108)
CO2: 24 mmol/L (ref 18–29)
CREATININE: 1.2 mg/dL — AB (ref 0.57–1.00)
GFR calc Af Amer: 58 mL/min/{1.73_m2} — ABNORMAL LOW (ref >59)
GFR calc non Af Amer: 51 mL/min/{1.73_m2} — ABNORMAL LOW (ref >59)
GLOBULIN, TOTAL: 2.3 g/dL (ref 1.5–4.5)
GLUCOSE: 97 mg/dL (ref 65–99)
Potassium: 4.5 mmol/L (ref 3.5–5.2)
Sodium: 143 mmol/L (ref 134–144)
TOTAL PROTEIN: 6.5 g/dL (ref 6.0–8.5)
Total Bilirubin: 0.3 mg/dL (ref 0.0–1.2)

## 2014-10-20 LAB — LIPID PANEL
CHOL/HDL RATIO: 2.8 ratio (ref 0.0–4.4)
Cholesterol, Total: 161 mg/dL (ref 100–199)
HDL: 57 mg/dL (ref >39)
LDL Calculated: 70 mg/dL (ref 0–99)
Triglycerides: 172 mg/dL — ABNORMAL HIGH (ref 0–149)
VLDL CHOLESTEROL CAL: 34 mg/dL (ref 5–40)

## 2014-10-20 LAB — TSH: TSH: 2.03 u[IU]/mL (ref 0.450–4.500)

## 2014-10-22 ENCOUNTER — Encounter: Payer: Self-pay | Admitting: Family Medicine

## 2014-10-22 ENCOUNTER — Ambulatory Visit (INDEPENDENT_AMBULATORY_CARE_PROVIDER_SITE_OTHER): Payer: 59 | Admitting: Family Medicine

## 2014-10-22 ENCOUNTER — Other Ambulatory Visit (HOSPITAL_COMMUNITY)
Admission: RE | Admit: 2014-10-22 | Discharge: 2014-10-22 | Disposition: A | Discharge status: Home / Self Care | Payer: 59 | Source: Ambulatory Visit | Attending: Family Medicine | Admitting: Family Medicine

## 2014-10-22 VITALS — BP 134/68 | HR 64 | Temp 97.9°F | Ht 61.0 in | Wt 215.8 lb

## 2014-10-22 DIAGNOSIS — Z0000 Encounter for general adult medical examination without abnormal findings: Secondary | ICD-10-CM | POA: Diagnosis not present

## 2014-10-22 DIAGNOSIS — E7800 Pure hypercholesterolemia, unspecified: Secondary | ICD-10-CM

## 2014-10-22 DIAGNOSIS — E039 Hypothyroidism, unspecified: Secondary | ICD-10-CM | POA: Diagnosis not present

## 2014-10-22 DIAGNOSIS — R748 Abnormal levels of other serum enzymes: Secondary | ICD-10-CM | POA: Diagnosis not present

## 2014-10-22 DIAGNOSIS — Z01419 Encounter for gynecological examination (general) (routine) without abnormal findings: Secondary | ICD-10-CM | POA: Diagnosis not present

## 2014-10-22 DIAGNOSIS — Z1231 Encounter for screening mammogram for malignant neoplasm of breast: Secondary | ICD-10-CM | POA: Insufficient documentation

## 2014-10-22 DIAGNOSIS — E669 Obesity, unspecified: Secondary | ICD-10-CM | POA: Diagnosis not present

## 2014-10-22 DIAGNOSIS — Z1151 Encounter for screening for human papillomavirus (HPV): Secondary | ICD-10-CM | POA: Diagnosis present

## 2014-10-22 DIAGNOSIS — R7989 Other specified abnormal findings of blood chemistry: Secondary | ICD-10-CM | POA: Insufficient documentation

## 2014-10-22 DIAGNOSIS — R944 Abnormal results of kidney function studies: Secondary | ICD-10-CM | POA: Insufficient documentation

## 2014-10-22 LAB — POCT URINALYSIS DIPSTICK
Blood, UA: NEGATIVE
Glucose, UA: NEGATIVE
Ketones, UA: NEGATIVE
NITRITE UA: NEGATIVE
PROTEIN UA: NEGATIVE
Spec Grav, UA: 1.02
UROBILINOGEN UA: 0.2
pH, UA: 6

## 2014-10-22 MED ORDER — LANSOPRAZOLE 30 MG PO CPDR: DELAYED_RELEASE_CAPSULE | ORAL | Status: DC

## 2014-10-22 MED ORDER — LOVASTATIN 40 MG PO TABS: ORAL_TABLET | ORAL | Status: DC

## 2014-10-22 MED ORDER — LEVOTHYROXINE SODIUM 50 MCG PO TABS: ORAL_TABLET | ORAL | Status: DC

## 2014-10-22 NOTE — Assessment & Plan Note (Signed)
No renal toxic meds  Drinks adequate water  Check ua today   Lab Results  Component Value Date   CREATININE 1.20* 10/19/2014

## 2014-10-22 NOTE — Progress Notes (Signed)
Subjective:    Patient ID: [name] [name], female    DOB: Sep 14, 1956, 58 y.o.   MRN: [id number]  HPI Here for annual medicare wellness visit and to review acute and chronic medical problems  I have personally reviewed the Medicare Annual Wellness questionnaire and have noted 1. The patient's medical and social history 2. Their use of alcohol, tobacco or illicit drugs 3. Their current medications and supplements 4. The patient's functional ability including ADL's, fall risks, home safety risks and hearing or visual             impairment. 5. Diet and physical activities 6. Evidence for depression or mood disorders  The patients weight, height, BMI have been recorded in the chart and visual acuity is per eye clinic.  I have made referrals, counseling and provided education to the patient based review of the above and I have provided the pt with a written personalized care plan for preventive services.  See scanned forms.  Routine anticipatory guidance given to patient.  See health maintenance. Colon cancer screening - ? Last time since colonoscopy , did ifob last year and it was nl - she declines colonoscopy   (did ifob-pending return)  Breast cancer screening mammogram was last year - tore her skin under breast (went to [place])- she is not eager to do it - she is willing to switch locations  Self breast exam-no breast lumps  Pap 9/12 - has not had a hysterectomy/ due for 3 y pap  Flu vaccine- had that in Sept  Tetanus vaccine 2010  Pneumovax 11/14  Zoster vaccine 5/12   Advance directive-needs to work on that - is interested in compl a living will and pOA -given a packet to look at today  Cognitive function addressed- see scanned forms- and if abnormal then additional documentation follows. - she gets occ forgetfulness (forgets why she walked into a room)  - her psych meds sometime causes problems   PMH and SH reviewed  Meds, vitals, and allergies reviewed.   ROS: See HPI.   Otherwise negative.    Has an area of hair on L side of head -no skin changes or hair loss   Does have some skin moles    Wt is down 12 lb with bmi of 40 Obese- going to weight watchers and has lost 27 lb so far! Feels wonderful  Walks 2 miles per day and does resistance exercise/ crunches    Hypothyroidism  Pt has no clinical changes No change in energy level/ hair or skin/ edema and no tremor Lab Results  Component Value Date   TSH 2.030 10/19/2014      Hyperlipidemia No results found for: CHOL Lab Results  Component Value Date   HDL 57 10/19/2014   HDL 68 10/13/2013   HDL 61 09/16/2012   Lab Results  Component Value Date   LDLCALC 70 10/19/2014   LDLCALC 81 10/13/2013   LDLCALC 85 09/16/2012   Lab Results  Component Value Date   TRIG 172* 10/19/2014   TRIG 118 10/13/2013   TRIG 127 09/16/2012   Lab Results  Component Value Date   CHOLHDL 2.8 10/19/2014   CHOLHDL 2.5 10/13/2013   CHOLHDL 2.8 09/16/2012   No results found for: LDLDIRECT  Trig were up  Ate some fried fish fillet the night before  Overall good profile with high HDL    Elevated cr   Chemistry      Component Value Date/Time   NA 143  10/19/2014 0935   K 4.5 10/19/2014 0935   CL 102 10/19/2014 0935   CO2 24 10/19/2014 0935   BUN 11 10/19/2014 0935   CREATININE 1.20* 10/19/2014 0935      Component Value Date/Time   CALCIUM 9.5 10/19/2014 0935   ALKPHOS 88 10/19/2014 0935   AST 21 10/19/2014 0935   ALT 17 10/19/2014 0935   BILITOT 0.3 10/19/2014 0935      She states she is drinking lots of water Cut down to 1 diet soda per day  Takes one aleve per week at most -no other nsaids   Urine smells strong No burning or other symptoms   Patient Active Problem List   Diagnosis Date Noted  . Encounter for routine gynecological examination 10/22/2014  . Encounter for screening mammogram for breast cancer 10/22/2014  . Elevated serum creatinine 10/22/2014  . Ramsay Hunt syndrome  (geniculate herpes zoster) 06/29/2014  . Colon cancer screening 10/16/2013  . Encounter for examination for insurance purposes 09/16/2012  . ETD (eustachian tube dysfunction) 04/11/2012  . Cerumen impaction, right 04/11/2012  . Other screening mammogram 10/15/2011  . Encounter for Medicare annual wellness exam 10/04/2011  . Low back pain 09/17/2011  . Tinea corporis 09/05/2011  . Bartholin gland cyst 08/21/2011  . CHEST PAIN 01/29/2011  . NEOPLASM OF UNCERTAIN BEHAVIOR OF SKIN 10/04/2010  . METATARSALGIA 12/01/2009  . POSTMENOPAUSAL STATUS 09/30/2009  . ARTHRITIS, RIGHT HIP 03/10/2009  . DEGENERATIVE DISC DISEASE, LUMBAR SPINE 03/10/2009  . ONYCHOMYCOSIS 09/17/2008  . Hypothyroidism 08/05/2007  . Obesity 08/05/2007  . BIPOLAR DISORDER UNSPECIFIED 08/05/2007  . ALLERGIC RHINITIS 08/05/2007  . GERD 08/05/2007  . IBS 08/05/2007  . HYPERCHOLESTEROLEMIA, PURE 07/07/2007   Past Medical History  Diagnosis Date  . Diverticulosis of colon   . Hyperlipidemia   . Liver cyst   . GERD (gastroesophageal reflux disease)   . IBS (irritable bowel syndrome)   . Bipolar 1 disorder   . Hypothyroidism   . Allergic rhinitis   . History of basal cell carcinoma excision   . History of squamous cell carcinoma excision   . Mallet finger of left hand     small finger   Past Surgical History  Procedure Laterality Date  . Colonoscopy  11/2002  . Laparoscopic cholecystectomy  03-18-2000  . Cardiovascular stress test  02-23-2011  dr Chrissie Noa fath [facility] clinic)    normal nuclear study/  no ischemia/  ef 64%  . Transthoracic echocardiogram  02-23-2011    normal lvf/  ef 58%/  mild rve/   mild lae/  mild  mr  & tr/  mild pulmonary htn  . Cardiac catheterization  04/1999    per pt normal  . Cesarean section  1984  . Cataract extraction w/ intraocular lens  implant, bilateral  2003  . Closed reduction finger with percutaneous pinning Left 04/22/2014    Procedure: LEFT SMALL FINGER CLOSED REDUCTION  PINNING;  Surgeon: Linna Hoff, MD;  Location: [facility];  Service: Orthopedics;  Laterality: Left;   History  Substance Use Topics  . Smoking status: Never Smoker   . Smokeless tobacco: Never Used  . Alcohol Use: No   Family History  Problem Relation Age of Onset  . Stroke Mother   . Heart disease Mother   . Hypertension Father   . Diabetes Father   . Hyperlipidemia Father    Allergies  Allergen Reactions  . Codeine Nausea And Vomiting    REACTION: vomits blood  .  Lamictal [Lamotrigine] Other (See Comments)    REACTION: sores in mouth, muscle aches and also made her manic bipolar  . Lithium Swelling  . Tegretol [Carbamazepine] Other (See Comments)    REACTION: skin crawls  . Xylocaine [Lidocaine Hcl] Swelling   Current Outpatient Prescriptions on File Prior to Visit  Medication Sig Dispense Refill  . ARIPiprazole (ABILIFY) 30 MG tablet Take 30 mg by mouth daily.      Marland Kitchen aspirin 81 MG tablet Take 81 mg by mouth daily.      Marland Kitchen buPROPion (WELLBUTRIN XL) 150 MG 24 hr tablet Take two tablets by mouth every morning     . cetirizine (ZYRTEC) 10 MG tablet Take 10 mg by mouth daily.      . Cholecalciferol (VITAMIN D) 2000 UNITS CAPS Take 1 capsule by mouth daily.      . clonazePAM (KLONOPIN) 1 MG tablet Take 1 mg by mouth at bedtime as needed.      . fluticasone (FLONASE) 50 MCG/ACT nasal spray Use 2 sprays in each  nostril daily 48 g 1  . Multiple Vitamin (MULTIVITAMIN) tablet Take 1 tablet by mouth daily.      Marland Kitchen olanzapine-fluoxetine (SYMBYAX) 3-25 MG per capsule Take 1 capsule by mouth at bedtime.      Marland Kitchen zolpidem (AMBIEN) 10 MG tablet Take 10 mg by mouth daily.       No current facility-administered medications on file prior to visit.    Review of Systems Review of Systems  Constitutional: Negative for fever, appetite change, fatigue and unexpected weight change.  Eyes: Negative for pain and visual disturbance.  Respiratory: Negative for cough and shortness  of breath.   Cardiovascular: Negative for cp or palpitations    Gastrointestinal: Negative for nausea, diarrhea and constipation.  Genitourinary: Negative for urgency and frequency.  Skin: Negative for pallor or rash   Neurological: Negative for weakness, light-headedness, numbness and headaches.  Hematological: Negative for adenopathy. Does not bruise/bleed easily.  Psychiatric/Behavioral: Negative for dysphoric mood. The patient is not nervous/anxious.         Objective:   Physical Exam  Constitutional: She appears well-developed and well-nourished. No distress.  obese and well appearing   HENT:  Head: Normocephalic and atraumatic.  Right Ear: External ear normal.  Left Ear: External ear normal.  Mouth/Throat: Oropharynx is clear and moist.  Eyes: Conjunctivae and EOM are normal. Pupils are equal, round, and reactive to light. No scleral icterus.  Neck: Normal range of motion. Neck supple. No JVD present. Carotid bruit is not present. No thyromegaly present.  Cardiovascular: Normal rate, regular rhythm, normal heart sounds and intact distal pulses.  Exam reveals no gallop.   Pulmonary/Chest: Effort normal and breath sounds normal. No respiratory distress. She has no wheezes. She exhibits no tenderness.  Abdominal: Soft. Bowel sounds are normal. She exhibits no distension, no abdominal bruit and no mass. There is no tenderness.  Genitourinary: Vagina normal and uterus normal. No breast swelling, tenderness, discharge or bleeding. There is no rash, tenderness or lesion on the right labia. There is no rash, tenderness or lesion on the left labia. Uterus is not tender. Cervix exhibits no motion tenderness, no discharge and no friability. Right adnexum displays no mass, no tenderness and no fullness. Left adnexum displays no mass, no tenderness and no fullness. No bleeding in the vagina. No vaginal discharge found.  Small introitus  Post cervix   Breast exam: No mass, nodules, thickening,  tenderness, bulging, retraction, inflamation, nipple discharge or  skin changes noted.  No axillary or clavicular LA.      Musculoskeletal: Normal range of motion. She exhibits no edema or tenderness.  Lymphadenopathy:    She has no cervical adenopathy.  Neurological: She is alert. She has normal reflexes. No cranial nerve deficit. She exhibits normal muscle tone. Coordination normal.  Skin: Skin is warm and dry. No rash noted. No erythema. No pallor.  Psychiatric: She has a normal mood and affect.          Assessment & Plan:   Problem List Items Addressed This Visit      Endocrine   Hypothyroidism    Hypothyroidism  Pt has no clinical changes No change in energy level/ hair or skin/ edema and no tremor Lab Results  Component Value Date   TSH 2.030 10/19/2014         Relevant Medications      levothyroxine (SYNTHROID, LEVOTHROID) tablet     Other   Elevated serum creatinine    No renal toxic meds  Drinks adequate water  Check ua today   Lab Results  Component Value Date   CREATININE 1.20* 10/19/2014       Relevant Orders      POCT urinalysis dipstick (Completed)   Encounter for Medicare annual wellness exam - Primary    Reviewed health habits including diet and exercise and skin cancer prevention Reviewed appropriate screening tests for age  Also reviewed health mt list, fam hx and immunization status , as well as social and family history   See HPI Will refer for mammogram  She will work on advanced directive - disc in detail and packet given       Encounter for routine gynecological examination    Routine exam with pap  Vaginal atrophy noted      Relevant Orders      Cytology - PAP   Encounter for screening mammogram for breast cancer    Scheduled annual screening mammogram Nl breast exam today  Encouraged monthly self exams      Relevant Orders      MM DIGITAL SCREENING BILATERAL   HYPERCHOLESTEROLEMIA, PURE    Disc goals for lipids and reasons  to control them Rev labs with pt Rev low sat fat diet in detail Trig up - poss from fried fish the night before  Will continue to follow    Relevant Medications      lovastatin (MEVACOR) 40 MG tablet   Obesity    Discussed how this problem influences overall health and the risks it imposes  Reviewed plan for weight loss with lower calorie diet (via better food choices and also portion control or program like weight watchers) and exercise building up to or more than 30 minutes 5 days per week including some aerobic activity   Commended on good progress with wt watchers! Will keep working on it

## 2014-10-22 NOTE — Patient Instructions (Signed)
Stop at check out for referral for mammogram  Keep drinking lots of water Leave a urine sample on the way out also please Pap done today  Keep working on weight loss  Please work on an advanced directive - I gave you some information on that

## 2014-10-22 NOTE — Assessment & Plan Note (Signed)
Disc goals for lipids and reasons to control them Rev labs with pt Rev low sat fat diet in detail Trig up - poss from fried fish the night before  Will continue to follow

## 2014-10-22 NOTE — Assessment & Plan Note (Signed)
Hypothyroidism  Pt has no clinical changes No change in energy level/ hair or skin/ edema and no tremor Lab Results  Component Value Date   TSH 2.030 10/19/2014

## 2014-10-22 NOTE — Assessment & Plan Note (Signed)
Reviewed health habits including diet and exercise and skin cancer prevention Reviewed appropriate screening tests for age  Also reviewed health mt list, fam hx and immunization status , as well as social and family history   See HPI Will refer for mammogram  She will work on advanced directive - disc in detail and packet given

## 2014-10-22 NOTE — Progress Notes (Signed)
Pre visit review using our clinic review tool, if applicable. No additional management support is needed unless otherwise documented below in the visit note. 

## 2014-10-22 NOTE — Assessment & Plan Note (Signed)
Discussed how this problem influences overall health and the risks it imposes  Reviewed plan for weight loss with lower calorie diet (via better food choices and also portion control or program like weight watchers) and exercise building up to or more than 30 minutes 5 days per week including some aerobic activity   Commended on good progress with wt watchers! Will keep working on it

## 2014-10-22 NOTE — Assessment & Plan Note (Signed)
Scheduled annual screening mammogram Nl breast exam today  Encouraged monthly self exams   

## 2014-10-22 NOTE — Assessment & Plan Note (Signed)
Routine exam with pap  Vaginal atrophy noted

## 2014-10-26 ENCOUNTER — Encounter: Payer: Self-pay | Admitting: *Deleted

## 2014-10-26 LAB — CYTOLOGY - PAP

## 2014-10-28 ENCOUNTER — Encounter: Payer: Self-pay | Admitting: *Deleted

## 2014-11-15 ENCOUNTER — Other Ambulatory Visit (INDEPENDENT_AMBULATORY_CARE_PROVIDER_SITE_OTHER): Payer: 59

## 2014-11-15 DIAGNOSIS — R944 Abnormal results of kidney function studies: Secondary | ICD-10-CM | POA: Diagnosis not present

## 2014-11-16 ENCOUNTER — Encounter: Payer: Self-pay | Admitting: *Deleted

## 2014-11-16 DIAGNOSIS — F314 Bipolar disorder, current episode depressed, severe, without psychotic features: Secondary | ICD-10-CM | POA: Diagnosis not present

## 2014-11-16 LAB — RENAL FUNCTION PANEL
Albumin: 4.2 g/dL (ref 3.5–5.5)
BUN/Creatinine Ratio: 17 (ref 9–23)
BUN: 18 mg/dL (ref 6–24)
CALCIUM: 9.4 mg/dL (ref 8.7–10.2)
CHLORIDE: 104 mmol/L (ref 97–108)
CO2: 24 mmol/L (ref 18–29)
Creatinine, Ser: 1.06 mg/dL — ABNORMAL HIGH (ref 0.57–1.00)
GFR calc Af Amer: 68 mL/min/{1.73_m2} (ref >59)
GFR, EST NON AFRICAN AMERICAN: 59 mL/min/{1.73_m2} — AB (ref >59)
Glucose: 89 mg/dL (ref 65–99)
POTASSIUM: 4.5 mmol/L (ref 3.5–5.2)
Phosphorus: 4.2 mg/dL (ref 2.5–4.5)
SODIUM: 144 mmol/L (ref 134–144)

## 2014-11-19 ENCOUNTER — Other Ambulatory Visit: Payer: Self-pay | Admitting: Family Medicine

## 2014-12-17 ENCOUNTER — Ambulatory Visit: Payer: 59

## 2014-12-17 ENCOUNTER — Other Ambulatory Visit: Payer: Self-pay | Admitting: Family Medicine

## 2014-12-17 ENCOUNTER — Ambulatory Visit
Admission: RE | Admit: 2014-12-17 | Discharge: 2014-12-17 | Disposition: A | Discharge status: Home / Self Care | Payer: Commercial Managed Care - HMO | Source: Ambulatory Visit | Attending: Family Medicine | Admitting: Family Medicine

## 2014-12-17 DIAGNOSIS — Z1231 Encounter for screening mammogram for malignant neoplasm of breast: Secondary | ICD-10-CM

## 2014-12-22 ENCOUNTER — Encounter: Payer: Self-pay | Admitting: *Deleted

## 2015-01-12 ENCOUNTER — Other Ambulatory Visit: Payer: Self-pay | Admitting: *Deleted

## 2015-01-12 MED ORDER — LOVASTATIN 40 MG PO TABS: ORAL_TABLET | ORAL | Status: DC

## 2015-01-12 MED ORDER — LEVOTHYROXINE SODIUM 50 MCG PO TABS: ORAL_TABLET | ORAL | Status: DC

## 2015-01-12 MED ORDER — LANSOPRAZOLE 30 MG PO CPDR: DELAYED_RELEASE_CAPSULE | ORAL | Status: DC

## 2015-01-12 NOTE — Telephone Encounter (Signed)
Faxed in rx from [place]. Patient switch pharmacy.

## 2015-01-17 DIAGNOSIS — F314 Bipolar disorder, current episode depressed, severe, without psychotic features: Secondary | ICD-10-CM | POA: Diagnosis not present

## 2015-02-22 ENCOUNTER — Encounter (INDEPENDENT_AMBULATORY_CARE_PROVIDER_SITE_OTHER): Payer: Self-pay

## 2015-02-22 ENCOUNTER — Encounter: Payer: Self-pay | Admitting: Internal Medicine

## 2015-02-22 ENCOUNTER — Encounter: Payer: Self-pay | Admitting: Family Medicine

## 2015-02-22 ENCOUNTER — Ambulatory Visit (INDEPENDENT_AMBULATORY_CARE_PROVIDER_SITE_OTHER): Payer: Commercial Managed Care - HMO | Admitting: Family Medicine

## 2015-02-22 VITALS — BP 110/72 | HR 53 | Temp 98.2°F | Ht 61.0 in | Wt 208.0 lb

## 2015-02-22 DIAGNOSIS — K602 Anal fissure, unspecified: Secondary | ICD-10-CM

## 2015-02-22 DIAGNOSIS — R21 Rash and other nonspecific skin eruption: Secondary | ICD-10-CM | POA: Insufficient documentation

## 2015-02-22 DIAGNOSIS — Z1211 Encounter for screening for malignant neoplasm of colon: Secondary | ICD-10-CM | POA: Diagnosis not present

## 2015-02-22 DIAGNOSIS — L989 Disorder of the skin and subcutaneous tissue, unspecified: Secondary | ICD-10-CM | POA: Diagnosis not present

## 2015-02-22 MED ORDER — NITROGLYCERIN 0.4 % RE OINT
TOPICAL_OINTMENT | RECTAL | Status: DC
Start: 2015-02-22 — End: 2015-10-25

## 2015-02-22 NOTE — Assessment & Plan Note (Signed)
R ant leg at site of old squamous cell ca Adv to return to derm (Dr Nicole Kindred) for re eval  Also disc sun protection

## 2015-02-22 NOTE — Assessment & Plan Note (Signed)
Suspect this given hx of hard stools followed by rectal pain and BRB Ext exam-one small non thrombosed ext hem -non tender  Px nitrogycerin oint to use rectally for a mo bid  Enc to use stool softeners to keep stool loose as this heals  Also ref to GI -as pt needs to discuss colon cancer screening as well

## 2015-02-22 NOTE — Progress Notes (Signed)
Subjective:    Patient ID: [name] [name], female    DOB: 06-Nov-1956, 59 y.o.   MRN: [id number]  HPI Here with constipation and hemorrhoid issues   She was out of town  Forgot her gerd med -and that tends to help her constipation Had to strain with hard BMs Had some BRB Very painful bm  Feels sharp/ like glass cutting her  Since October  Kept thinking she was going to get better   BRB with bms  On stool and paper  Not a large amount   No longer has constipation  Has a bm every day Some firm (normal, not hard)  and some runny  No abdominal pain   colonosc 2006 nl   Has a site where squamous cell was removed from R leg- Dr Nicole Kindred  Came back in the same place  She plans to go back  A little red and raised and rough - nicked it with shaver    Patient Active Problem List   Diagnosis Date Noted  . Anal fissure 02/22/2015  . Encounter for routine gynecological examination 10/22/2014  . Encounter for screening mammogram for breast cancer 10/22/2014  . Elevated serum creatinine 10/22/2014  . Ramsay Hunt syndrome (geniculate herpes zoster) 06/29/2014  . Colon cancer screening 10/16/2013  . Encounter for examination for insurance purposes 09/16/2012  . ETD (eustachian tube dysfunction) 04/11/2012  . Cerumen impaction, right 04/11/2012  . Other screening mammogram 10/15/2011  . Encounter for Medicare annual wellness exam 10/04/2011  . Low back pain 09/17/2011  . Tinea corporis 09/05/2011  . Bartholin gland cyst 08/21/2011  . CHEST PAIN 01/29/2011  . NEOPLASM OF UNCERTAIN BEHAVIOR OF SKIN 10/04/2010  . METATARSALGIA 12/01/2009  . POSTMENOPAUSAL STATUS 09/30/2009  . ARTHRITIS, RIGHT HIP 03/10/2009  . DEGENERATIVE DISC DISEASE, LUMBAR SPINE 03/10/2009  . ONYCHOMYCOSIS 09/17/2008  . Hypothyroidism 08/05/2007  . Obesity 08/05/2007  . BIPOLAR DISORDER UNSPECIFIED 08/05/2007  . ALLERGIC RHINITIS 08/05/2007  . GERD 08/05/2007  . IBS 08/05/2007  . HYPERCHOLESTEROLEMIA,  PURE 07/07/2007   Past Medical History  Diagnosis Date  . Diverticulosis of colon   . Hyperlipidemia   . Liver cyst   . GERD (gastroesophageal reflux disease)   . IBS (irritable bowel syndrome)   . Bipolar 1 disorder   . Hypothyroidism   . Allergic rhinitis   . History of basal cell carcinoma excision   . History of squamous cell carcinoma excision   . Mallet finger of left hand     small finger   Past Surgical History  Procedure Laterality Date  . Colonoscopy  11/2002  . Laparoscopic cholecystectomy  03-18-2000  . Cardiovascular stress test  02-23-2011  dr Chrissie Noa fath [facility] clinic)    normal nuclear study/  no ischemia/  ef 64%  . Transthoracic echocardiogram  02-23-2011    normal lvf/  ef 58%/  mild rve/   mild lae/  mild  mr  & tr/  mild pulmonary htn  . Cardiac catheterization  04/1999    per pt normal  . Cesarean section  1984  . Cataract extraction w/ intraocular lens  implant, bilateral  2003  . Closed reduction finger with percutaneous pinning Left 04/22/2014    Procedure: LEFT SMALL FINGER CLOSED REDUCTION PINNING;  Surgeon: Linna Hoff, MD;  Location: [facility];  Service: Orthopedics;  Laterality: Left;   History  Substance Use Topics  . Smoking status: Never Smoker   . Smokeless tobacco: Never Used  .  Alcohol Use: No   Family History  Problem Relation Age of Onset  . Stroke Mother   . Heart disease Mother   . Hypertension Father   . Diabetes Father   . Hyperlipidemia Father    Allergies  Allergen Reactions  . Codeine Nausea And Vomiting    REACTION: vomits blood  . Lamictal [Lamotrigine] Other (See Comments)    REACTION: sores in mouth, muscle aches and also made her manic bipolar  . Lithium Swelling  . Tegretol [Carbamazepine] Other (See Comments)    REACTION: skin crawls  . Xylocaine [Lidocaine Hcl] Swelling   Current Outpatient Prescriptions on File Prior to Visit  Medication Sig Dispense Refill  . ARIPiprazole (ABILIFY)  30 MG tablet Take 30 mg by mouth daily.      Marland Kitchen aspirin 81 MG tablet Take 81 mg by mouth daily.      Marland Kitchen buPROPion (WELLBUTRIN XL) 150 MG 24 hr tablet Take two tablets by mouth every morning     . cetirizine (ZYRTEC) 10 MG tablet Take 10 mg by mouth daily.      . Cholecalciferol (VITAMIN D) 2000 UNITS CAPS Take 1 capsule by mouth daily.      . clonazePAM (KLONOPIN) 1 MG tablet Take 1 mg by mouth at bedtime as needed.      . fluticasone (FLONASE) 50 MCG/ACT nasal spray Use 2 sprays in each  nostril daily 48 g 1  . lansoprazole (PREVACID) 30 MG capsule Take 1 capsule by mouth  daily 90 capsule 3  . levothyroxine (SYNTHROID, LEVOTHROID) 50 MCG tablet Take 1 tablet by mouth  daily 90 tablet 3  . lovastatin (MEVACOR) 40 MG tablet Take 1 tablet by mouth  daily 90 tablet 3  . Multiple Vitamin (MULTIVITAMIN) tablet Take 1 tablet by mouth daily.      Marland Kitchen olanzapine-fluoxetine (SYMBYAX) 3-25 MG per capsule Take 1 capsule by mouth at bedtime.      Marland Kitchen zolpidem (AMBIEN) 10 MG tablet Take 10 mg by mouth daily.       No current facility-administered medications on file prior to visit.    Review of Systems    Review of Systems  Constitutional: Negative for fever, appetite change, fatigue and unexpected weight change.  Eyes: Negative for pain and visual disturbance.  Respiratory: Negative for cough and shortness of breath.   Cardiovascular: Negative for cp or palpitations    Gastrointestinal: Negative for nausea, diarrhea and pos for occ constipation, pos for small amt of brb in stool and rectal pain with defacatoin, neg for abd pain or cramping  Genitourinary: Negative for urgency and frequency.  Skin: Negative for pallor or rash   Neurological: Negative for weakness, light-headedness, numbness and headaches.  Hematological: Negative for adenopathy. Does not bruise/bleed easily.  Psychiatric/Behavioral: Negative for dysphoric mood. The patient is not nervous/anxious.      Objective:   Physical Exam    Constitutional: She appears well-developed and well-nourished. No distress.  obese and well appearing   HENT:  Head: Normocephalic and atraumatic.  Eyes: Conjunctivae and EOM are normal. Pupils are equal, round, and reactive to light. No scleral icterus.  Neck: Normal range of motion. Neck supple.  Cardiovascular: Normal rate and regular rhythm.   Pulmonary/Chest: Effort normal and breath sounds normal.  Abdominal: Soft. Bowel sounds are normal. She exhibits no distension and no mass. There is no tenderness. There is no rebound and no guarding.  Genitourinary:  External rectal exam- small ext hemorrhoid -non thrombosed and nt  Internal exam not attempted due to pain   Lymphadenopathy:    She has no cervical adenopathy.  Neurological: She is alert.  Skin: Skin is warm and dry. No rash noted. No erythema. No pallor.  .5 cm area of pink scale on R ant shin at site of old squamous cell removal  Psychiatric: She has a normal mood and affect.          Assessment & Plan:   Problem List Items Addressed This Visit      Digestive   Anal fissure - Primary    Suspect this given hx of hard stools followed by rectal pain and BRB Ext exam-one small non thrombosed ext hem -non tender  Px nitrogycerin oint to use rectally for a mo bid  Enc to use stool softeners to keep stool loose as this heals  Also ref to GI -as pt needs to discuss colon cancer screening as well      Relevant Orders   Ambulatory referral to Gastroenterology     Other   Colon cancer screening   Relevant Orders   Ambulatory referral to Gastroenterology

## 2015-02-22 NOTE — Patient Instructions (Signed)
I think you have an anal fissure (small tear)  Use the nitroglycerin ointment as directed Keep stools loose with stool softener  Stop at check out for referral to GI

## 2015-02-22 NOTE — Progress Notes (Signed)
Pre visit review using our clinic review tool, if applicable. No additional management support is needed unless otherwise documented below in the visit note. 

## 2015-02-23 ENCOUNTER — Telehealth: Payer: Self-pay | Admitting: Family Medicine

## 2015-02-23 NOTE — Telephone Encounter (Signed)
Spoke with [facility] about alternative nitroglycerin ointment, they recommend proctecol hc or lidocaine ointment.

## 2015-02-23 NOTE — Telephone Encounter (Signed)
I would use the lidocaine but she is allergic to it-so we cannot  Keep stools soft to loose to avoid straining  Follow up with GI as planned (referral done)

## 2015-02-23 NOTE — Telephone Encounter (Signed)
Patient went to pick up the nitroglycerin cream.  Patient said it cost $397.  Patient wants to know if you could recommend something else.  Patient did buy hemorrhoid cream.  [facility] goes to [facility].

## 2015-02-23 NOTE — Telephone Encounter (Signed)
Please call the pharmacist and ask them if they have an alternative to nitroglycerin ointment to use for rectal fissure that may be cheaper.  Thanks - let me know

## 2015-02-24 NOTE — Telephone Encounter (Signed)
Patient advised.

## 2015-03-01 DIAGNOSIS — H524 Presbyopia: Secondary | ICD-10-CM | POA: Diagnosis not present

## 2015-03-01 DIAGNOSIS — H5210 Myopia, unspecified eye: Secondary | ICD-10-CM | POA: Diagnosis not present

## 2015-03-15 ENCOUNTER — Telehealth: Payer: Self-pay | Admitting: *Deleted

## 2015-03-15 DIAGNOSIS — F3160 Bipolar disorder, current episode mixed, unspecified: Secondary | ICD-10-CM

## 2015-03-15 NOTE — Telephone Encounter (Signed)
Referral done  Will also route to [facility]

## 2015-03-15 NOTE — Telephone Encounter (Signed)
Patient called stating that she needs a referral done to Dr. Marjory Lies her psychologist for an appointment on 03/21/15. Patient would like a call back when this has been done.

## 2015-03-18 NOTE — Telephone Encounter (Signed)
Called patient back , gave her [facility]'s phone number to call to see if Dr Marjory Lies is in [place]. No Referral is needed.

## 2015-03-21 DIAGNOSIS — F314 Bipolar disorder, current episode depressed, severe, without psychotic features: Secondary | ICD-10-CM | POA: Diagnosis not present

## 2015-03-25 DIAGNOSIS — F3174 Bipolar disorder, in full remission, most recent episode manic: Secondary | ICD-10-CM | POA: Diagnosis not present

## 2015-03-30 ENCOUNTER — Encounter: Payer: Self-pay | Admitting: Internal Medicine

## 2015-04-08 ENCOUNTER — Ambulatory Visit (AMBULATORY_SURGERY_CENTER): Payer: Self-pay

## 2015-04-08 VITALS — Ht 61.0 in | Wt 208.6 lb

## 2015-04-08 DIAGNOSIS — Z1211 Encounter for screening for malignant neoplasm of colon: Secondary | ICD-10-CM

## 2015-04-08 NOTE — Progress Notes (Signed)
Per pt, no allergies to soy or egg products.Pt not taking any weight loss meds or using  O2 at home. 

## 2015-04-14 ENCOUNTER — Encounter: Payer: Self-pay | Admitting: Gastroenterology

## 2015-04-22 ENCOUNTER — Encounter: Payer: Commercial Managed Care - HMO | Admitting: Gastroenterology

## 2015-04-22 ENCOUNTER — Encounter: Payer: Commercial Managed Care - HMO | Admitting: Internal Medicine

## 2015-05-02 ENCOUNTER — Encounter: Payer: Commercial Managed Care - HMO | Admitting: Internal Medicine

## 2015-05-06 ENCOUNTER — Encounter: Payer: Commercial Managed Care - HMO | Admitting: Gastroenterology

## 2015-05-16 DIAGNOSIS — Z85828 Personal history of other malignant neoplasm of skin: Secondary | ICD-10-CM | POA: Diagnosis not present

## 2015-05-16 DIAGNOSIS — I781 Nevus, non-neoplastic: Secondary | ICD-10-CM | POA: Diagnosis not present

## 2015-05-16 DIAGNOSIS — Z1283 Encounter for screening for malignant neoplasm of skin: Secondary | ICD-10-CM | POA: Diagnosis not present

## 2015-05-16 DIAGNOSIS — I8393 Asymptomatic varicose veins of bilateral lower extremities: Secondary | ICD-10-CM | POA: Diagnosis not present

## 2015-05-16 DIAGNOSIS — L578 Other skin changes due to chronic exposure to nonionizing radiation: Secondary | ICD-10-CM | POA: Diagnosis not present

## 2015-05-16 DIAGNOSIS — L570 Actinic keratosis: Secondary | ICD-10-CM | POA: Diagnosis not present

## 2015-05-16 DIAGNOSIS — D229 Melanocytic nevi, unspecified: Secondary | ICD-10-CM | POA: Diagnosis not present

## 2015-05-16 DIAGNOSIS — L821 Other seborrheic keratosis: Secondary | ICD-10-CM | POA: Diagnosis not present

## 2015-05-18 ENCOUNTER — Telehealth: Payer: Self-pay | Admitting: Gastroenterology

## 2015-05-18 NOTE — Telephone Encounter (Signed)
Pt's questions were answered about proper dosing of Miralax for colonoscopy prep

## 2015-05-18 NOTE — Telephone Encounter (Signed)
No answer will try later.  

## 2015-05-20 ENCOUNTER — Encounter: Payer: Self-pay | Admitting: Gastroenterology

## 2015-05-20 ENCOUNTER — Ambulatory Visit (AMBULATORY_SURGERY_CENTER): Payer: Commercial Managed Care - HMO | Admitting: Gastroenterology

## 2015-05-20 VITALS — BP 126/70 | HR 50 | Temp 98.2°F | Resp 19 | Ht 61.0 in | Wt 208.0 lb

## 2015-05-20 DIAGNOSIS — F319 Bipolar disorder, unspecified: Secondary | ICD-10-CM | POA: Diagnosis not present

## 2015-05-20 DIAGNOSIS — Z1211 Encounter for screening for malignant neoplasm of colon: Secondary | ICD-10-CM | POA: Diagnosis present

## 2015-05-20 DIAGNOSIS — F341 Dysthymic disorder: Secondary | ICD-10-CM | POA: Diagnosis not present

## 2015-05-20 DIAGNOSIS — E039 Hypothyroidism, unspecified: Secondary | ICD-10-CM | POA: Diagnosis not present

## 2015-05-20 MED ORDER — SODIUM CHLORIDE 0.9 % IV SOLN: 500.0000 mL | INTRAVENOUS | Status: DC

## 2015-05-20 NOTE — Progress Notes (Signed)
Report to PACU, RN, vss, BBS= Clear.  

## 2015-05-20 NOTE — Op Note (Signed)
[place]  [address] [place], [id number]   COLONOSCOPY PROCEDURE REPORT  PATIENT: [name], [name]  MR#: [id number] BIRTHDATE: 24-Jan-1956 , 39  yrs. old GENDER: female ENDOSCOPIST: Milus Banister, MD REFERRED NT:[id number] Vernell Morgans, M.D. PROCEDURE DATE:  05/20/2015 PROCEDURE:   Colonoscopy, screening First Screening Colonoscopy - Avg.  risk and is 50 yrs.  old or older Yes.  Prior Negative Screening - Now for repeat screening. N/A  History of Adenoma - Now for follow-up colonoscopy & has been > or = to 3 yrs.  N/A  Recommend repeat exam, <10 yrs? No ASA CLASS:   Class III INDICATIONS:Screening for colonic neoplasia and Colorectal Neoplasm Risk Assessment for this procedure is average risk. MEDICATIONS: Monitored anesthesia care and Propofol 200 mg IV  DESCRIPTION OF PROCEDURE:   After the risks benefits and alternatives of the procedure were thoroughly explained, informed consent was obtained.  The digital rectal exam revealed no abnormalities of the rectum.   The LB [id number]  endoscope was introduced through the anus and advanced to the cecum, which was identified by both the appendix and ileocecal valve. No adverse events experienced.   The quality of the prep was good.  excellent. The instrument was then slowly withdrawn as the colon was fully examined. Estimated blood loss is zero unless otherwise noted in this procedure report.      COLON FINDINGS: A normal appearing cecum, ileocecal valve, and appendiceal orifice were identified.  The ascending, transverse, descending, sigmoid colon, and rectum appeared unremarkable. Retroflexed views revealed no abnormalities. The time to cecum = 3.2 Withdrawal time = 8.2   The scope was withdrawn and the procedure completed. COMPLICATIONS: There were no immediate complications.  ENDOSCOPIC IMPRESSION: Normal colonoscopy No polyps or cancers  RECOMMENDATIONS: You should continue to follow colorectal  cancer screening guidelines for "routine risk" patients with a repeat colonoscopy in 10 years.   eSigned:  Milus Banister, MD 05/20/2015 2:35 PM

## 2015-05-20 NOTE — Patient Instructions (Signed)
YOU HAD AN ENDOSCOPIC PROCEDURE TODAY AT THE [facility]:   Refer to the procedure report that was given to you for any specific questions about what was found during the examination.  If the procedure report does not answer your questions, please call your gastroenterologist to clarify.  If you requested that your care partner not be given the details of your procedure findings, then the procedure report has been included in a sealed envelope for you to review at your convenience later.  YOU SHOULD EXPECT: Some feelings of bloating in the abdomen. Passage of more gas than usual.  Walking can help get rid of the air that was put into your GI tract during the procedure and reduce the bloating. If you had a lower endoscopy (such as a colonoscopy or flexible sigmoidoscopy) you may notice spotting of blood in your stool or on the toilet paper. If you underwent a bowel prep for your procedure, you may not have a normal bowel movement for a few days.  Please Note:  You might notice some irritation and congestion in your nose or some drainage.  This is from the oxygen used during your procedure.  There is no need for concern and it should clear up in a day or so.  SYMPTOMS TO REPORT IMMEDIATELY:   Following lower endoscopy (colonoscopy or flexible sigmoidoscopy):  Excessive amounts of blood in the stool  Significant tenderness or worsening of abdominal pains  Swelling of the abdomen that is new, acute  Fever of 100F or higher   Following upper endoscopy (EGD)  Vomiting of blood or coffee ground material  New chest pain or pain under the shoulder blades  Painful or persistently difficult swallowing  New shortness of breath  Fever of 100F or higher  Black, tarry-looking stools  For urgent or emergent issues, a gastroenterologist can be reached at any hour by calling [phone].   DIET: Your first meal following the procedure should be a small meal and then it is ok to progress to  your normal diet. Heavy or fried foods are harder to digest and may make you feel nauseous or bloated.  Likewise, meals heavy in dairy and vegetables can increase bloating.  Drink plenty of fluids but you should avoid alcoholic beverages for 24 hours.  ACTIVITY:  You should plan to take it easy for the rest of today and you should NOT DRIVE or use heavy machinery until tomorrow (because of the sedation medicines used during the test).    FOLLOW UP: Our staff will call the number listed on your records the next business day following your procedure to check on you and address any questions or concerns that you may have regarding the information given to you following your procedure. If we do not reach you, we will leave a message.  However, if you are feeling well and you are not experiencing any problems, there is no need to return our call.  We will assume that you have returned to your regular daily activities without incident.  If any biopsies were taken you will be contacted by phone or by letter within the next 1-3 weeks.  Please call us at [phone] if you have not heard about the biopsies in 3 weeks.    SIGNATURES/CONFIDENTIALITY: You and/or your care partner have signed paperwork which will be entered into your electronic medical record.  These signatures attest to the fact that that the information above on your After Visit Summary has been reviewed   and is understood.  Full responsibility of the confidentiality of this discharge information lies with you and/or your care-partner. 

## 2015-05-23 ENCOUNTER — Telehealth: Payer: Self-pay | Admitting: *Deleted

## 2015-05-23 NOTE — Telephone Encounter (Signed)
  Follow up Call-  Call back number 05/20/2015  Post procedure Call Back phone  # [phone]  Permission to leave phone message Yes     Patient questions:  Do you have a fever, pain , or abdominal swelling? No. Pain Score  0 *  Have you tolerated food without any problems? Yes.    Have you been able to return to your normal activities? Yes.    Do you have any questions about your discharge instructions: Diet   No. Medications  No. Follow up visit  No.  Do you have questions or concerns about your Care? No.  Actions: * If pain score is 4 or above: No action needed, pain <4.

## 2015-05-27 DIAGNOSIS — E780 Pure hypercholesterolemia: Secondary | ICD-10-CM | POA: Diagnosis not present

## 2015-05-27 DIAGNOSIS — R002 Palpitations: Secondary | ICD-10-CM | POA: Diagnosis not present

## 2015-06-07 ENCOUNTER — Ambulatory Visit: Payer: Commercial Managed Care - HMO | Admitting: Family Medicine

## 2015-07-19 DIAGNOSIS — F314 Bipolar disorder, current episode depressed, severe, without psychotic features: Secondary | ICD-10-CM | POA: Diagnosis not present

## 2015-10-17 ENCOUNTER — Telehealth: Payer: Self-pay | Admitting: Family Medicine

## 2015-10-17 DIAGNOSIS — Z0000 Encounter for general adult medical examination without abnormal findings: Secondary | ICD-10-CM

## 2015-10-17 NOTE — Telephone Encounter (Signed)
-----   Message from Ellamae Sia sent at 10/17/2015  4:08 PM EST ----- Regarding: Lab orders for Wednesday, 11.9.16 Patient is scheduled for CPX labs, please order future labs, Thanks , Karna Christmas

## 2015-10-18 ENCOUNTER — Other Ambulatory Visit: Payer: 59

## 2015-10-19 ENCOUNTER — Other Ambulatory Visit (INDEPENDENT_AMBULATORY_CARE_PROVIDER_SITE_OTHER): Payer: Commercial Managed Care - HMO

## 2015-10-19 DIAGNOSIS — Z0000 Encounter for general adult medical examination without abnormal findings: Secondary | ICD-10-CM

## 2015-10-20 LAB — COMPREHENSIVE METABOLIC PANEL
ALK PHOS: 86 IU/L (ref 39–117)
ALT: 18 IU/L (ref 0–32)
AST: 22 IU/L (ref 0–40)
Albumin/Globulin Ratio: 1.8 (ref 1.1–2.5)
Albumin: 4.4 g/dL (ref 3.5–5.5)
BUN/Creatinine Ratio: 14 (ref 9–23)
BUN: 15 mg/dL (ref 6–24)
Bilirubin Total: 0.4 mg/dL (ref 0.0–1.2)
CO2: 23 mmol/L (ref 18–29)
CREATININE: 1.07 mg/dL — AB (ref 0.57–1.00)
Calcium: 9.7 mg/dL (ref 8.7–10.2)
Chloride: 102 mmol/L (ref 97–106)
GFR calc Af Amer: 67 mL/min/{1.73_m2} (ref >59)
GFR calc non Af Amer: 58 mL/min/{1.73_m2} — ABNORMAL LOW (ref >59)
GLUCOSE: 95 mg/dL (ref 65–99)
Globulin, Total: 2.4 g/dL (ref 1.5–4.5)
Potassium: 4.5 mmol/L (ref 3.5–5.2)
Sodium: 146 mmol/L — ABNORMAL HIGH (ref 136–144)
Total Protein: 6.8 g/dL (ref 6.0–8.5)

## 2015-10-20 LAB — LIPID PANEL
CHOLESTEROL TOTAL: 193 mg/dL (ref 100–199)
Chol/HDL Ratio: 2.7 ratio units (ref 0.0–4.4)
HDL: 72 mg/dL (ref >39)
LDL CALC: 90 mg/dL (ref 0–99)
TRIGLYCERIDES: 155 mg/dL — AB (ref 0–149)
VLDL CHOLESTEROL CAL: 31 mg/dL (ref 5–40)

## 2015-10-20 LAB — CBC WITH DIFFERENTIAL/PLATELET
BASOS ABS: 0 10*3/uL (ref 0.0–0.2)
Basos: 0 %
EOS (ABSOLUTE): 0 10*3/uL (ref 0.0–0.4)
Eos: 0 %
HEMOGLOBIN: 13.3 g/dL (ref 11.1–15.9)
Hematocrit: 40.3 % (ref 34.0–46.6)
IMMATURE GRANULOCYTES: 0 %
Immature Grans (Abs): 0 10*3/uL (ref 0.0–0.1)
LYMPHS ABS: 1.3 10*3/uL (ref 0.7–3.1)
Lymphs: 28 %
MCH: 29.6 pg (ref 26.6–33.0)
MCHC: 33 g/dL (ref 31.5–35.7)
MCV: 90 fL (ref 79–97)
MONOCYTES: 6 %
MONOS ABS: 0.3 10*3/uL (ref 0.1–0.9)
NEUTROS PCT: 66 %
Neutrophils Absolute: 3.1 10*3/uL (ref 1.4–7.0)
Platelets: 202 10*3/uL (ref 150–379)
RBC: 4.49 x10E6/uL (ref 3.77–5.28)
RDW: 13.8 % (ref 12.3–15.4)
WBC: 4.7 10*3/uL (ref 3.4–10.8)

## 2015-10-20 LAB — TSH: TSH: 2.71 u[IU]/mL (ref 0.450–4.500)

## 2015-10-25 ENCOUNTER — Ambulatory Visit (INDEPENDENT_AMBULATORY_CARE_PROVIDER_SITE_OTHER): Payer: Commercial Managed Care - HMO | Admitting: Family Medicine

## 2015-10-25 ENCOUNTER — Encounter: Payer: Self-pay | Admitting: Family Medicine

## 2015-10-25 ENCOUNTER — Encounter (INDEPENDENT_AMBULATORY_CARE_PROVIDER_SITE_OTHER): Payer: Self-pay

## 2015-10-25 VITALS — BP 128/82 | HR 62 | Temp 97.9°F | Ht 61.0 in | Wt 206.2 lb

## 2015-10-25 DIAGNOSIS — H9190 Unspecified hearing loss, unspecified ear: Secondary | ICD-10-CM | POA: Insufficient documentation

## 2015-10-25 DIAGNOSIS — E7800 Pure hypercholesterolemia, unspecified: Secondary | ICD-10-CM | POA: Diagnosis not present

## 2015-10-25 DIAGNOSIS — E039 Hypothyroidism, unspecified: Secondary | ICD-10-CM

## 2015-10-25 DIAGNOSIS — E669 Obesity, unspecified: Secondary | ICD-10-CM

## 2015-10-25 DIAGNOSIS — Z0000 Encounter for general adult medical examination without abnormal findings: Secondary | ICD-10-CM | POA: Diagnosis not present

## 2015-10-25 DIAGNOSIS — H9193 Unspecified hearing loss, bilateral: Secondary | ICD-10-CM

## 2015-10-25 MED ORDER — LANSOPRAZOLE 30 MG PO CPDR: DELAYED_RELEASE_CAPSULE | ORAL | Status: DC

## 2015-10-25 MED ORDER — LEVOTHYROXINE SODIUM 50 MCG PO TABS: ORAL_TABLET | ORAL | Status: DC

## 2015-10-25 MED ORDER — LOVASTATIN 40 MG PO TABS: ORAL_TABLET | ORAL | Status: DC

## 2015-10-25 NOTE — Patient Instructions (Addendum)
Stop at check out for ref to ENT for hearing issues  Please work on an advance directive - see the blue packet Talk to your psychiatrist about memory- ?how much problem is medicine side effect   If you need a referral later to orthopedist for knees and back let us know   Labs look stable

## 2015-10-25 NOTE — Progress Notes (Signed)
Subjective:    Patient ID: [name] [name], female    DOB: 08-12-1956, 59 y.o.   MRN: [id number]  HPI  Here for annual medicare wellness visit as well as chronic/acute medical problems and also annual preventative exam  I have personally reviewed the Medicare Annual Wellness questionnaire and have noted 1. The patient's medical and social history 2. Their use of alcohol, tobacco or illicit drugs 3. Their current medications and supplements 4. The patient's functional ability including ADL's, fall risks, home safety risks and hearing or visual             impairment. 5. Diet and physical activities 6. Evidence for depression or mood disorders  The patients weight, height, BMI have been recorded in the chart and visual acuity is per eye clinic.  I have made referrals, counseling and provided education to the patient based review of the above and I have provided the pt with a written personalized care plan for preventive services. Reviewed and updated provider list, see scanned forms.  Doing very well overall A lot of traveling lately - not good on her diet overall (eating out and carry out with family) Walking for exercise  [facility] -loves that   Wt is down 2 lb with bmi of 38    See scanned forms.  Routine anticipatory guidance given to patient.  See health maintenance. Colon cancer screening 11/16 -normal 10 year recall  Breast cancer screening 1/16 nl  Self breast exam - no breast lumps  Flu vaccine- had one in Sept at [facility]  Tetanus vaccine 10/10 Pap nl 11/15 , no hx of abnormal paps/ no new partners  Pneumovax -not 65 yet-has not done  Zoster vaccine - 5/12 (and also had shingles) dexa- had one a long time ago - was normal (will plan next one at 67) Advance directive- does not have a living will or POA  Cognitive function addressed- see scanned forms- and if abnormal then additional documentation follows.  She is concerned about  it - ? Has not disc with her psychiatrist  She thinks she gets confused easily /occ walks in room and forgets why  Knows president and date and can read a watch face  ? Unsure about recall   PMH and SH reviewed  Meds, vitals, and allergies reviewed.   ROS: See HPI.  Otherwise negative.     Hypothyroidism  Pt has no clinical changes No change in energy level/ hair or skin/ edema and no tremor Lab Results  Component Value Date   TSH 2.710 10/19/2015     Hx of elevated liver enzymes Stable Lab Results  Component Value Date   ALT 18 10/19/2015   AST 22 10/19/2015   ALKPHOS 86 10/19/2015   BILITOT 0.4 10/19/2015    Cholesterol Lab Results  Component Value Date   CHOL 193 10/19/2015   CHOL 161 10/19/2014   CHOL 173 10/13/2013   Lab Results  Component Value Date   HDL 72 10/19/2015   HDL 57 10/19/2014   HDL 68 10/13/2013   Lab Results  Component Value Date   LDLCALC 90 10/19/2015   LDLCALC 70 10/19/2014   LDLCALC 81 10/13/2013   Lab Results  Component Value Date   TRIG 155* 10/19/2015   TRIG 172* 10/19/2014   TRIG 118 10/13/2013   Lab Results  Component Value Date   CHOLHDL 2.7 10/19/2015   CHOLHDL 2.8 10/19/2014  CHOLHDL 2.5 10/13/2013   No results found for: LDLDIRECT  On mevacor and diet  Really good control   Blood glucose 95 fasting    Patient Active Problem List   Diagnosis Date Noted  . Hearing loss 10/25/2015  . Routine general medical examination at a health care facility 10/17/2015  . Anal fissure 02/22/2015  . Skin lesion 02/22/2015  . Encounter for routine gynecological examination 10/22/2014  . Elevated serum creatinine 10/22/2014  . Ramsay Hunt syndrome (geniculate herpes zoster) 06/29/2014  . Colon cancer screening 10/16/2013  . ETD (eustachian tube dysfunction) 04/11/2012  . Cerumen impaction, right 04/11/2012  . Encounter for Medicare annual wellness exam 10/04/2011  . Low back pain 09/17/2011  . Tinea corporis 09/05/2011  .  Bartholin gland cyst 08/21/2011  . CHEST PAIN 01/29/2011  . NEOPLASM OF UNCERTAIN BEHAVIOR OF SKIN 10/04/2010  . METATARSALGIA 12/01/2009  . POSTMENOPAUSAL STATUS 09/30/2009  . ARTHRITIS, RIGHT HIP 03/10/2009  . DEGENERATIVE DISC DISEASE, LUMBAR SPINE 03/10/2009  . ONYCHOMYCOSIS 09/17/2008  . Hypothyroidism 08/05/2007  . Obesity 08/05/2007  . Bipolar disorder ([place]) 08/05/2007  . ALLERGIC RHINITIS 08/05/2007  . GERD 08/05/2007  . IBS 08/05/2007  . HYPERCHOLESTEROLEMIA, PURE 07/07/2007   Past Medical History  Diagnosis Date  . Hyperlipidemia   . Liver cyst   . GERD (gastroesophageal reflux disease)   . IBS (irritable bowel syndrome)   . Bipolar 1 disorder ([place])   . Hypothyroidism   . Allergic rhinitis   . History of basal cell carcinoma excision     ON FACE  . History of squamous cell carcinoma excision     ON RIGHT LEG  . Mallet finger of left hand     small finger  . Fissure, anal    Past Surgical History  Procedure Laterality Date  . Colonoscopy  11/2002  . Laparoscopic cholecystectomy  03-18-2000  . Cardiovascular stress test  02-23-2011  dr Chrissie Noa [facility] clinic)    normal nuclear study/  no ischemia/  ef 64%  . Transthoracic echocardiogram  02-23-2011    normal lvf/  ef 58%/  mild rve/   mild lae/  mild  mr  & tr/  mild pulmonary htn  . Cardiac catheterization  04/1999    per pt normal  . Cesarean section  1984  . Cataract extraction w/ intraocular lens  implant, bilateral  2003    Bil  . Closed reduction finger with percutaneous pinning Left 04/22/2014    Procedure: LEFT SMALL FINGER CLOSED REDUCTION PINNING;  Surgeon: Linna Hoff, MD;  Location: [facility];  Service: Orthopedics;  Laterality: Left;  . Wisdom tooth extraction     Social History  Substance Use Topics  . Smoking status: Never Smoker   . Smokeless tobacco: Never Used  . Alcohol Use: No   Family History  Problem Relation Age of Onset  . Stroke Mother   . Heart  disease Mother   . Hypertension Father   . Diabetes Father   . Hyperlipidemia Father   . Diabetes Brother    Allergies  Allergen Reactions  . Xylocaine [Lidocaine Hcl] Swelling    ALLERGIC TO ALL CAINES EXCEPT SENSORCAINE , AND MARCAINE  . Codeine Nausea And Vomiting    REACTION: vomits blood  . Lamictal [Lamotrigine] Other (See Comments)    REACTION: sores in mouth, muscle aches and also made her manic bipolar  . Lithium Swelling  . Tegretol [Carbamazepine] Other (See Comments)    REACTION: skin crawls  Current Outpatient Prescriptions on File Prior to Visit  Medication Sig Dispense Refill  . ARIPiprazole (ABILIFY) 30 MG tablet Take 30 mg by mouth daily.      Marland Kitchen aspirin 81 MG tablet Take 81 mg by mouth daily.      . bisacodyl (DULCOLAX) 5 MG EC tablet Take 5 mg by mouth. Dulcolax 5 mg bowel prep #4-Take as directed    . buPROPion (WELLBUTRIN XL) 150 MG 24 hr tablet Take two tablets by mouth every morning     . cetirizine (ZYRTEC) 10 MG tablet Take 10 mg by mouth daily.      . Cholecalciferol (VITAMIN D) 2000 UNITS CAPS Take 1 capsule by mouth daily.      . clonazePAM (KLONOPIN) 1 MG tablet Take 1 mg by mouth at bedtime as needed.      . fluticasone (FLONASE) 50 MCG/ACT nasal spray Use 2 sprays in each  nostril daily 48 g 1  . Multiple Vitamin (MULTIVITAMIN) tablet Take 1 tablet by mouth daily.      . Naproxen Sodium (ALEVE PO) Take by mouth. EVERY OTHER DAY    . olanzapine-fluoxetine (SYMBYAX) 3-25 MG per capsule Take 1 capsule by mouth at bedtime.      Marland Kitchen zolpidem (AMBIEN) 10 MG tablet Take 10 mg by mouth at bedtime.      No current facility-administered medications on file prior to visit.    Review of Systems Review of Systems  Constitutional: Negative for fever, appetite change, fatigue and unexpected weight change.  Eyes: Negative for pain and visual disturbance.  Respiratory: Negative for cough and shortness of breath.   Cardiovascular: Negative for cp or palpitations      Gastrointestinal: Negative for nausea, diarrhea and constipation.  Genitourinary: Negative for urgency and frequency.  Skin: Negative for pallor or rash   Neurological: Negative for weakness, light-headedness, numbness and headaches.  Hematological: Negative for adenopathy. Does not bruise/bleed easily.  Psychiatric/Behavioral: Negative for dysphoric mood. The patient is not nervous/anxious. Pos for  fairly controlled bipolar disorder and some slowed cognition from her medications        Objective:   Physical Exam  Constitutional: She appears well-developed and well-nourished. No distress.  obese and well appearing   HENT:  Head: Normocephalic and atraumatic.  Right Ear: External ear normal.  Left Ear: External ear normal.  Mouth/Throat: Oropharynx is clear and moist.  Pos for scant cerumen    Eyes: Conjunctivae and EOM are normal. Pupils are equal, round, and reactive to light. No scleral icterus.  Neck: Normal range of motion. Neck supple. No JVD present. Carotid bruit is not present. No thyromegaly present.  Cardiovascular: Normal rate, regular rhythm, normal heart sounds and intact distal pulses.  Exam reveals no gallop.   Pulmonary/Chest: Effort normal and breath sounds normal. No respiratory distress. She has no wheezes. She exhibits no tenderness.  Abdominal: Soft. Bowel sounds are normal. She exhibits no distension, no abdominal bruit and no mass. There is no tenderness.  Genitourinary: No breast swelling, tenderness, discharge or bleeding.  Breast exam: No mass, nodules, thickening, tenderness, bulging, retraction, inflamation, nipple discharge or skin changes noted.  No axillary or clavicular LA.      Musculoskeletal: Normal range of motion. She exhibits no edema or tenderness.  Lymphadenopathy:    She has no cervical adenopathy.  Neurological: She is alert. She has normal reflexes. No cranial nerve deficit. She exhibits normal muscle tone. Coordination normal.  Skin: Skin  is warm and dry. No rash  noted. No erythema. No pallor.  Psychiatric: She has a normal mood and affect.          Assessment & Plan:   Problem List Items Addressed This Visit      Endocrine   Hypothyroidism    Hypothyroidism  Pt has no clinical changes No change in energy level/ hair or skin/ edema and no tremor Lab Results  Component Value Date   TSH 2.710 10/19/2015          Relevant Medications   levothyroxine (SYNTHROID, LEVOTHROID) 50 MCG tablet     Nervous and Auditory   Hearing loss    Problematic for pt and family  Ref to ENT      Relevant Orders   Ambulatory referral to ENT     Other   Encounter for Medicare annual wellness exam - Primary    Reviewed health habits including diet and exercise and skin cancer prevention Reviewed appropriate screening tests for age  Also reviewed health mt list, fam hx and immunization status , as well as social and family history   See HPI Labs reviewed Stop at check out for ref to ENT for hearing issues  Please work on an advance directive - see the blue packet Talk to your psychiatrist about memory- ?how much problem is medicine side effect   If you need a referral later to orthopedist for knees and back let us know   Labs look stable       HYPERCHOLESTEROLEMIA, PURE    Disc goals for lipids and reasons to control them Rev labs with pt Rev low sat fat diet in detail Good control with lovastatin and diet       Relevant Medications   lovastatin (MEVACOR) 40 MG tablet   Obesity    Discussed how this problem influences overall health and the risks it imposes  Reviewed plan for weight loss with lower calorie diet (via better food choices and also portion control or program like weight watchers) and exercise building up to or more than 30 minutes 5 days per week including some aerobic activity         Routine general medical examination at a health care facility    Reviewed health habits including diet and  exercise and skin cancer prevention Reviewed appropriate screening tests for age  Also reviewed health mt list, fam hx and immunization status , as well as social and family history   See HPI Labs reviewed Stop at check out for ref to ENT for hearing issues  Please work on an advance directive - see the blue packet Talk to your psychiatrist about memory- ?how much problem is medicine side effect   If you need a referral later to orthopedist for knees and back let us know   Labs look stable

## 2015-10-25 NOTE — Progress Notes (Signed)
Pre visit review using our clinic review tool, if applicable. No additional management support is needed unless otherwise documented below in the visit note. 

## 2015-10-26 NOTE — Assessment & Plan Note (Signed)
Disc goals for lipids and reasons to control them Rev labs with pt Rev low sat fat diet in detail Good control with lovastatin and diet

## 2015-10-26 NOTE — Assessment & Plan Note (Signed)
Reviewed health habits including diet and exercise and skin cancer prevention Reviewed appropriate screening tests for age  Also reviewed health mt list, fam hx and immunization status , as well as social and family history   See HPI Labs reviewed Stop at check out for ref to ENT for hearing issues  Please work on an advance directive - see the blue packet Talk to your psychiatrist about memory- ?how much problem is medicine side effect   If you need a referral later to orthopedist for knees and back let us know   Labs look stable

## 2015-10-26 NOTE — Assessment & Plan Note (Signed)
Problematic for pt and family  Ref to ENT

## 2015-10-26 NOTE — Assessment & Plan Note (Signed)
Hypothyroidism  Pt has no clinical changes No change in energy level/ hair or skin/ edema and no tremor Lab Results  Component Value Date   TSH 2.710 10/19/2015

## 2015-10-26 NOTE — Assessment & Plan Note (Signed)
Discussed how this problem influences overall health and the risks it imposes  Reviewed plan for weight loss with lower calorie diet (via better food choices and also portion control or program like weight watchers) and exercise building up to or more than 30 minutes 5 days per week including some aerobic activity    

## 2015-11-10 DIAGNOSIS — F314 Bipolar disorder, current episode depressed, severe, without psychotic features: Secondary | ICD-10-CM | POA: Diagnosis not present

## 2015-11-14 DIAGNOSIS — H903 Sensorineural hearing loss, bilateral: Secondary | ICD-10-CM | POA: Diagnosis not present

## 2015-11-14 DIAGNOSIS — H6121 Impacted cerumen, right ear: Secondary | ICD-10-CM | POA: Diagnosis not present

## 2015-11-15 ENCOUNTER — Other Ambulatory Visit: Payer: Self-pay

## 2015-11-15 DIAGNOSIS — Z1231 Encounter for screening mammogram for malignant neoplasm of breast: Secondary | ICD-10-CM

## 2015-12-20 ENCOUNTER — Ambulatory Visit
Admission: RE | Admit: 2015-12-20 | Discharge: 2015-12-20 | Disposition: A | Discharge status: Home / Self Care | Payer: Medicare HMO | Source: Ambulatory Visit

## 2015-12-20 DIAGNOSIS — Z1231 Encounter for screening mammogram for malignant neoplasm of breast: Secondary | ICD-10-CM | POA: Diagnosis not present

## 2016-01-20 ENCOUNTER — Ambulatory Visit (INDEPENDENT_AMBULATORY_CARE_PROVIDER_SITE_OTHER): Payer: Commercial Managed Care - HMO | Admitting: Family Medicine

## 2016-01-20 ENCOUNTER — Encounter: Payer: Self-pay | Admitting: Family Medicine

## 2016-01-20 ENCOUNTER — Ambulatory Visit: Payer: Medicare HMO | Admitting: Primary Care

## 2016-01-20 VITALS — BP 114/80 | HR 57 | Temp 98.4°F | Ht 61.0 in | Wt 211.8 lb

## 2016-01-20 DIAGNOSIS — H6983 Other specified disorders of Eustachian tube, bilateral: Secondary | ICD-10-CM | POA: Diagnosis not present

## 2016-01-20 DIAGNOSIS — J209 Acute bronchitis, unspecified: Secondary | ICD-10-CM | POA: Diagnosis not present

## 2016-01-20 MED ORDER — AZITHROMYCIN 250 MG PO TABS: ORAL_TABLET | ORAL | Status: DC

## 2016-01-20 NOTE — Assessment & Plan Note (Signed)
Given wheeze intermitant and pt fever and worsening.. Will treat with antibitoics. Start mucolytic as well.

## 2016-01-20 NOTE — Assessment & Plan Note (Signed)
Start nasal steroid spray  given copius fluid behind ears.

## 2016-01-20 NOTE — Patient Instructions (Addendum)
Complete a course of antibiotics. Start mucinex DM twice daily. Start flonase  2 sprays per nostril daily. Consider nasal saline irrigation/ spray. Call if not improving as expected or if fever on antibiotics. Go to ER if  Severe shortness of breath.

## 2016-01-20 NOTE — Progress Notes (Signed)
Pre visit review using our clinic review tool, if applicable. No additional management support is needed unless otherwise documented below in the visit note. 

## 2016-01-20 NOTE — Progress Notes (Signed)
   Subjective:    Patient ID: [name] [name], female    DOB: [birth year], 58 y.o.   MRN: [id number]  Cough This is a new problem. The current episode started in the past 7 days. The problem has been gradually worsening. The cough is productive of purulent sputum. Associated symptoms include a fever, headaches, myalgias, nasal congestion, shortness of breath and wheezing. Pertinent negatives include no chills, ear pain, postnasal drip, sore throat or weight loss. Associated symptoms comments: Ear fullness Subjective fever  3 days ago. The symptoms are aggravated by lying down. Risk factors: nonsmoker. Treatments tried: coricidin HBP. The treatment provided mild relief. Her past medical history is significant for environmental allergies. There is no history of asthma, bronchiectasis, bronchitis, COPD, emphysema or pneumonia.  Fever  Associated symptoms include coughing, headaches and wheezing. Pertinent negatives include no ear pain or sore throat.   Social History /Family History/Past Medical History reviewed and updated if needed.    Review of Systems  Constitutional: Positive for fever. Negative for chills and weight loss.  HENT: Negative for ear pain, postnasal drip and sore throat.   Respiratory: Positive for cough, shortness of breath and wheezing.   Musculoskeletal: Positive for myalgias.  Allergic/Immunologic: Positive for environmental allergies.  Neurological: Positive for headaches.       Objective:   Physical Exam  Constitutional: Vital signs are normal. She appears well-developed and well-nourished. She is cooperative.  Non-toxic appearance. She does not appear ill. No distress.  HENT:  Head: Normocephalic.  Right Ear: Hearing, external ear and ear canal normal. Tympanic membrane is not erythematous, not retracted and not bulging. A middle ear effusion is present.  Left Ear: Hearing, external ear and ear canal normal. Tympanic membrane is not erythematous, not retracted and  not bulging. A middle ear effusion is present.  Nose: Mucosal edema and rhinorrhea present. Right sinus exhibits no maxillary sinus tenderness and no frontal sinus tenderness. Left sinus exhibits no maxillary sinus tenderness and no frontal sinus tenderness.  Mouth/Throat: Uvula is midline and mucous membranes are normal. Posterior oropharyngeal erythema present. No posterior oropharyngeal edema.  Eyes: Conjunctivae, EOM and lids are normal. Pupils are equal, round, and reactive to light. Lids are everted and swept, no foreign bodies found.  Neck: Trachea normal and normal range of motion. Neck supple. Carotid bruit is not present. No thyroid mass and no thyromegaly present.  Cardiovascular: Normal rate, regular rhythm, S1 normal, S2 normal, normal heart sounds, intact distal pulses and normal pulses.  Exam reveals no gallop and no friction rub.   No murmur heard. Pulmonary/Chest: Effort normal. No tachypnea. No respiratory distress. She has no decreased breath sounds. She has wheezes. She has no rhonchi. She has no rales.  Diffuse intermittant wheeze  Neurological: She is alert.  Skin: Skin is warm, dry and intact. No rash noted.  Psychiatric: Her speech is normal and behavior is normal. Judgment normal. Her mood appears not anxious. Cognition and memory are normal. She does not exhibit a depressed mood.          Assessment & Plan:

## 2016-02-01 ENCOUNTER — Telehealth: Payer: Self-pay | Admitting: Family Medicine

## 2016-02-01 NOTE — Telephone Encounter (Signed)
Pt called stating she saw dr Diona Browner 01/20/16.  She took all the meds and was finally feeling better.  She stated she thinks it is coming back and she will be going out of town on Friday and wanted to see if you would call her in something for this  [facility]

## 2016-02-02 ENCOUNTER — Ambulatory Visit (INDEPENDENT_AMBULATORY_CARE_PROVIDER_SITE_OTHER): Payer: Commercial Managed Care - HMO | Admitting: Primary Care

## 2016-02-02 ENCOUNTER — Encounter: Payer: Self-pay | Admitting: Primary Care

## 2016-02-02 VITALS — BP 118/84 | HR 61 | Temp 98.0°F | Ht 61.0 in | Wt 211.8 lb

## 2016-02-02 DIAGNOSIS — R05 Cough: Secondary | ICD-10-CM

## 2016-02-02 DIAGNOSIS — R059 Cough, unspecified: Secondary | ICD-10-CM

## 2016-02-02 MED ORDER — BENZONATATE 200 MG PO CAPS: 200.0000 mg | ORAL_CAPSULE | Freq: Three times a day (TID) | ORAL | Status: DC | PRN

## 2016-02-02 NOTE — Telephone Encounter (Signed)
Needs appt, preferrably with PCP, for re-eval.

## 2016-02-02 NOTE — Patient Instructions (Addendum)
You may take Benzonatate capsules for cough. Take 1 capsule by mouth three times daily as needed for cough.  Start Zyrtec tablets daily for the next 3-4 weeks.  Continue Flonase for nasal congestion and sinus pressure.  This cough may last up to 2 weeks after completion of antibiotics.  Please notify me if you develop persistent fevers of 101, start coughing up green mucous, notice increased fatigue or weakness, or feel worse after 1 week of onset of symptoms.   Increase consumption of water intake and rest.  It was a pleasure meeting you!

## 2016-02-02 NOTE — Progress Notes (Signed)
Subjective:    Patient ID: [name] [name], female    DOB: 1956/11/15, 60 y.o.   MRN: [id number]  HPI  [name] [name] is a 60 year old female who presents today with a chief complaint of cough. She also reports headache, sinus pressure. She was evaluated on 01/20/16 with complaints of a 7 day history of cough, wheezing, SOB, and fevers. She was treated for an acute bronchitis with Zpak, mucinex, and Flonase.  Since her visit on 01/20/16 she' feeling slightly improved with her shortness of breath, but has a persistent cough. She's now been through 1 full box of mucinex. Her cough is most bothersome at night and is productive with whitish sputum. She's not taken anything else for cough besides Mucinex DM. Denies fevers, chills, body aches.  Review of Systems  Constitutional: Negative for fever and chills.  HENT: Positive for congestion, sinus pressure and sore throat. Negative for ear pain.   Respiratory: Positive for cough and wheezing. Negative for shortness of breath.   Musculoskeletal: Negative for myalgias.       Past Medical History  Diagnosis Date  . Hyperlipidemia   . Liver cyst   . GERD (gastroesophageal reflux disease)   . IBS (irritable bowel syndrome)   . Bipolar 1 disorder ([place])   . Hypothyroidism   . Allergic rhinitis   . History of basal cell carcinoma excision     ON FACE  . History of squamous cell carcinoma excision     ON RIGHT LEG  . Mallet finger of left hand     small finger  . Fissure, anal     Social History   Social History  . Marital Status: Married    Spouse Name: N/A  . Number of Children: 1  . Years of Education: N/A   Occupational History  . Home    Social History Main Topics  . Smoking status: Never Smoker   . Smokeless tobacco: Never Used  . Alcohol Use: No  . Drug Use: No  . Sexual Activity: Not on file   Other Topics Concern  . Not on file   Social History Narrative   Moved here from [place]    Past Surgical History  Procedure  Laterality Date  . Colonoscopy  11/2002  . Laparoscopic cholecystectomy  03-18-2000  . Cardiovascular stress test  02-23-2011  dr Chrissie Noa [facility])    normal nuclear study/  no ischemia/  ef 64%  . Transthoracic echocardiogram  02-23-2011    normal lvf/  ef 58%/  mild rve/   mild lae/  mild  mr  & tr/  mild pulmonary htn  . Cardiac catheterization  04/1999    per pt normal  . Cesarean section  1984  . Cataract extraction w/ intraocular lens  implant, bilateral  2003    Bil  . Closed reduction finger with percutaneous pinning Left 04/22/2014    Procedure: LEFT SMALL FINGER CLOSED REDUCTION PINNING;  Surgeon: Linna Hoff, MD;  Location: [facility];  Service: Orthopedics;  Laterality: Left;  . Wisdom tooth extraction      Family History  Problem Relation Age of Onset  . Stroke Mother   . Heart disease Mother   . Hypertension Father   . Diabetes Father   . Hyperlipidemia Father   . Diabetes Brother     Allergies  Allergen Reactions  . Xylocaine [Lidocaine Hcl] Swelling    ALLERGIC TO ALL CAINES EXCEPT SENSORCAINE , AND MARCAINE  .  Codeine Nausea And Vomiting    REACTION: vomits blood  . Lamictal [Lamotrigine] Other (See Comments)    REACTION: sores in mouth, muscle aches and also made her manic bipolar  . Lithium Swelling  . Tegretol [Carbamazepine] Other (See Comments)    REACTION: skin crawls    Current Outpatient Prescriptions on File Prior to Visit  Medication Sig Dispense Refill  . ARIPiprazole (ABILIFY) 30 MG tablet Take 30 mg by mouth daily.      Marland Kitchen aspirin 81 MG tablet Take 81 mg by mouth daily.      . bisacodyl (DULCOLAX) 5 MG EC tablet Take 5 mg by mouth. Dulcolax 5 mg bowel prep #4-Take as directed    . buPROPion (WELLBUTRIN XL) 150 MG 24 hr tablet Take two tablets by mouth every morning     . cetirizine (ZYRTEC) 10 MG tablet Take 10 mg by mouth daily.      . Cholecalciferol (VITAMIN D) 2000 UNITS CAPS Take 1 capsule by mouth daily.       . clonazePAM (KLONOPIN) 1 MG tablet Take 1 mg by mouth at bedtime as needed.      Marland Kitchen FLUoxetine (PROZAC) 20 MG capsule Take 20 mg by mouth daily.    . fluticasone (FLONASE) 50 MCG/ACT nasal spray Use 2 sprays in each  nostril daily 48 g 1  . lansoprazole (PREVACID) 30 MG capsule Take 1 capsule by mouth  daily 90 capsule 3  . levothyroxine (SYNTHROID, LEVOTHROID) 50 MCG tablet Take 1 tablet by mouth  daily 90 tablet 3  . lovastatin (MEVACOR) 40 MG tablet Take 1 tablet by mouth  daily 90 tablet 3  . Multiple Vitamin (MULTIVITAMIN) tablet Take 1 tablet by mouth daily.      . Naproxen Sodium (ALEVE PO) Take by mouth. EVERY OTHER DAY    . OLANZapine (ZYPREXA) 2.5 MG tablet Take 2.5 mg by mouth at bedtime.    Marland Kitchen OLANZapine (ZYPREXA) 5 MG tablet     . zolpidem (AMBIEN) 10 MG tablet Take 10 mg by mouth at bedtime.      No current facility-administered medications on file prior to visit.    BP 118/84 mmHg  Pulse 61  Temp(Src) 98 F (36.7 C) (Oral)  Ht 5\' 1"  (1.549 m)  Wt 211 lb 12.8 oz (96.072 kg)  BMI 40.04 kg/m2  SpO2 99%    Objective:   Physical Exam  Constitutional: She appears well-nourished.  HENT:  Right Ear: Tympanic membrane is not erythematous. A middle ear effusion is present.  Left Ear: Tympanic membrane is not erythematous. A middle ear effusion is present.  Nose: Right sinus exhibits maxillary sinus tenderness. Right sinus exhibits no frontal sinus tenderness. Left sinus exhibits maxillary sinus tenderness. Left sinus exhibits no frontal sinus tenderness.  Mouth/Throat: Oropharynx is clear and moist.  Eyes: Conjunctivae are normal.  Neck: Neck supple.  Cardiovascular: Normal rate and regular rhythm.   Pulmonary/Chest: Effort normal and breath sounds normal. She has no wheezes. She has no rales.  Lymphadenopathy:    She has no cervical adenopathy.  Skin: Skin is warm and dry.          Assessment & Plan:  Post Bronchitic Cough:  Diagnosed and treated for acute  bronchitis on 01/20/16. Completed antibiotics, continues to cough, overall feeling improved. Exam with clear lungs. Effusions noted to bilateral ears, otherwise unremarkable. Discussed that cough may last up to 2 weeks post treatment. Will treat with supportive measures. RX for tessalon pearls. Start Zyrtec.Continue flonase.  Return precautions provided.

## 2016-02-02 NOTE — Telephone Encounter (Signed)
Spoke with Mrs. Harlan.  She was seen by Allie Bossier this morning.

## 2016-02-02 NOTE — Progress Notes (Signed)
Pre visit review using our clinic review tool, if applicable. No additional management support is needed unless otherwise documented below in the visit note. 

## 2016-02-14 DIAGNOSIS — F314 Bipolar disorder, current episode depressed, severe, without psychotic features: Secondary | ICD-10-CM | POA: Diagnosis not present

## 2016-03-26 ENCOUNTER — Encounter: Payer: Self-pay | Admitting: Emergency Medicine

## 2016-03-26 ENCOUNTER — Emergency Department
Admission: EM | Admit: 2016-03-26 | Discharge: 2016-03-26 | Disposition: A | Discharge status: Home / Self Care | Payer: Commercial Managed Care - HMO | Attending: Emergency Medicine | Admitting: Emergency Medicine

## 2016-03-26 ENCOUNTER — Emergency Department: Payer: Commercial Managed Care - HMO

## 2016-03-26 DIAGNOSIS — Y939 Activity, unspecified: Secondary | ICD-10-CM | POA: Diagnosis not present

## 2016-03-26 DIAGNOSIS — M546 Pain in thoracic spine: Secondary | ICD-10-CM | POA: Diagnosis not present

## 2016-03-26 DIAGNOSIS — S7001XA Contusion of right hip, initial encounter: Secondary | ICD-10-CM | POA: Insufficient documentation

## 2016-03-26 DIAGNOSIS — S79911A Unspecified injury of right hip, initial encounter: Secondary | ICD-10-CM | POA: Diagnosis not present

## 2016-03-26 DIAGNOSIS — M7989 Other specified soft tissue disorders: Secondary | ICD-10-CM | POA: Diagnosis not present

## 2016-03-26 DIAGNOSIS — M25551 Pain in right hip: Secondary | ICD-10-CM | POA: Diagnosis not present

## 2016-03-26 DIAGNOSIS — Y999 Unspecified external cause status: Secondary | ICD-10-CM | POA: Diagnosis not present

## 2016-03-26 DIAGNOSIS — S299XXA Unspecified injury of thorax, initial encounter: Secondary | ICD-10-CM | POA: Diagnosis not present

## 2016-03-26 DIAGNOSIS — Y929 Unspecified place or not applicable: Secondary | ICD-10-CM | POA: Insufficient documentation

## 2016-03-26 DIAGNOSIS — F319 Bipolar disorder, unspecified: Secondary | ICD-10-CM | POA: Diagnosis not present

## 2016-03-26 DIAGNOSIS — E785 Hyperlipidemia, unspecified: Secondary | ICD-10-CM | POA: Insufficient documentation

## 2016-03-26 DIAGNOSIS — M25571 Pain in right ankle and joints of right foot: Secondary | ICD-10-CM | POA: Diagnosis not present

## 2016-03-26 LAB — URINALYSIS COMPLETE WITH MICROSCOPIC (ARMC ONLY)
BACTERIA UA: NONE SEEN
BILIRUBIN URINE: NEGATIVE
GLUCOSE, UA: NEGATIVE mg/dL
HGB URINE DIPSTICK: NEGATIVE
Ketones, ur: NEGATIVE mg/dL
LEUKOCYTES UA: NEGATIVE
Nitrite: NEGATIVE
Protein, ur: NEGATIVE mg/dL
RBC / HPF: NONE SEEN RBC/hpf (ref 0–5)
Specific Gravity, Urine: 1.013 (ref 1.005–1.030)
pH: 5 (ref 5.0–8.0)

## 2016-03-26 MED ORDER — METHOCARBAMOL 500 MG PO TABS
1000.0000 mg | ORAL_TABLET | Freq: Once | ORAL | Status: AC
  Administered 2016-03-26: 1000 mg via ORAL
  Filled 2016-03-26: qty 2

## 2016-03-26 MED ORDER — ONDANSETRON 4 MG PO TBDP
4.0000 mg | ORAL_TABLET | Freq: Once | ORAL | Status: AC
  Administered 2016-03-26: 4 mg via ORAL
  Filled 2016-03-26: qty 1

## 2016-03-26 MED ORDER — CARISOPRODOL 350 MG PO TABS: 350.0000 mg | ORAL_TABLET | Freq: Two times a day (BID) | ORAL | Status: DC

## 2016-03-26 MED ORDER — TRAMADOL HCL 50 MG PO TABS
50.0000 mg | ORAL_TABLET | Freq: Once | ORAL | Status: AC
  Administered 2016-03-26: 50 mg via ORAL
  Filled 2016-03-26: qty 1

## 2016-03-26 MED ORDER — NAPROXEN 500 MG PO TABS: 500.0000 mg | ORAL_TABLET | Freq: Two times a day (BID) | ORAL | Status: DC
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
## 2016-04-02 ENCOUNTER — Ambulatory Visit (INDEPENDENT_AMBULATORY_CARE_PROVIDER_SITE_OTHER): Payer: Commercial Managed Care - HMO | Admitting: Primary Care

## 2016-04-02 ENCOUNTER — Encounter: Payer: Self-pay | Admitting: Primary Care

## 2016-04-02 VITALS — BP 118/80 | HR 77 | Temp 98.2°F | Ht 61.0 in | Wt 213.4 lb

## 2016-04-02 DIAGNOSIS — R05 Cough: Secondary | ICD-10-CM

## 2016-04-02 DIAGNOSIS — R059 Cough, unspecified: Secondary | ICD-10-CM

## 2016-04-02 LAB — POC INFLUENZA A&B (BINAX/QUICKVUE)
INFLUENZA B, POC: NEGATIVE
Influenza A, POC: NEGATIVE

## 2016-04-02 MED ORDER — AZITHROMYCIN 250 MG PO TABS: ORAL_TABLET | ORAL | Status: DC

## 2016-04-02 NOTE — Patient Instructions (Signed)
Start Azithromycin antibiotics. Take 2 tablets by mouth today, then 1 tablet daily for 4 additional days.  Continue Mucinex DM for cough and congestion.   Continue tylenol for fevers and body aches.  Your flu test was negative.  Ensure you are staying hydrated with water to prevent dehydration.   Please notify us if no improvement in 3-4 days.  It was a pleasure meeting you!

## 2016-04-02 NOTE — Progress Notes (Signed)
Pre visit review using our clinic review tool, if applicable. No additional management support is needed unless otherwise documented below in the visit note. 

## 2016-04-02 NOTE — Addendum Note (Signed)
Addended by: Jacqualin Combes on: 04/02/2016 02:37 PM   Modules accepted: Orders, SmartSet

## 2016-04-02 NOTE — Progress Notes (Signed)
Subjective:    Patient ID: [name] [name], female    DOB: 03-21-1956, 60 y.o.   MRN: [id number]  HPI  [name] [name] is a 60 year old female who presents today with a chief complaint of cough. She also reports fevers, nasal congestion, sore throat, body aches, chills. Her symptoms began suddenly on Saturday (2 days ago) with a sore throat and fever of 101. Last night she began coughing up thick yellow sputum. She's been taking Mucinex DM, aleve, and tylenol with temporary improvement. She had the flu shot in September 2016. She denies sick contacts. She's not had any tylenol or advil today.  Review of Systems  Constitutional: Positive for fever, chills and fatigue.  HENT: Positive for congestion and sinus pressure.   Respiratory: Positive for cough and shortness of breath. Negative for wheezing.   Cardiovascular: Negative for chest pain.  Musculoskeletal: Positive for myalgias.       Past Medical History  Diagnosis Date  . Hyperlipidemia   . Liver cyst   . GERD (gastroesophageal reflux disease)   . IBS (irritable bowel syndrome)   . Bipolar 1 disorder ([place])   . Hypothyroidism   . Allergic rhinitis   . History of basal cell carcinoma excision     ON FACE  . History of squamous cell carcinoma excision     ON RIGHT LEG  . Mallet finger of left hand     small finger  . Fissure, anal      Social History   Social History  . Marital Status: Married    Spouse Name: N/A  . Number of Children: 1  . Years of Education: N/A   Occupational History  . Home    Social History Main Topics  . Smoking status: Never Smoker   . Smokeless tobacco: Never Used  . Alcohol Use: No  . Drug Use: No  . Sexual Activity: Not on file   Other Topics Concern  . Not on file   Social History Narrative   Moved here from [place]    Past Surgical History  Procedure Laterality Date  . Colonoscopy  11/2002  . Laparoscopic cholecystectomy  03-18-2000  . Cardiovascular stress test  02-23-2011  dr  Chrissie Noa [facility])    normal nuclear study/  no ischemia/  ef 64%  . Transthoracic echocardiogram  02-23-2011    normal lvf/  ef 58%/  mild rve/   mild lae/  mild  mr  & tr/  mild pulmonary htn  . Cardiac catheterization  04/1999    per pt normal  . Cesarean section  1984  . Cataract extraction w/ intraocular lens  implant, bilateral  2003    Bil  . Closed reduction finger with percutaneous pinning Left 04/22/2014    Procedure: LEFT SMALL FINGER CLOSED REDUCTION PINNING;  Surgeon: Linna Hoff, MD;  Location: [facility];  Service: Orthopedics;  Laterality: Left;  . Wisdom tooth extraction      Family History  Problem Relation Age of Onset  . Stroke Mother   . Heart disease Mother   . Hypertension Father   . Diabetes Father   . Hyperlipidemia Father   . Diabetes Brother     Allergies  Allergen Reactions  . Xylocaine [Lidocaine Hcl] Swelling    ALLERGIC TO ALL CAINES EXCEPT SENSORCAINE , AND MARCAINE  . Codeine Nausea And Vomiting    REACTION: vomits blood  . Lamictal [Lamotrigine] Other (See Comments)    REACTION: sores  in mouth, muscle aches and also made her manic bipolar  . Lithium Swelling  . Tegretol [Carbamazepine] Other (See Comments)    REACTION: skin crawls    Current Outpatient Prescriptions on File Prior to Visit  Medication Sig Dispense Refill  . ARIPiprazole (ABILIFY) 30 MG tablet Take 30 mg by mouth daily.      Marland Kitchen aspirin 81 MG tablet Take 81 mg by mouth daily.      Marland Kitchen buPROPion (WELLBUTRIN XL) 150 MG 24 hr tablet Take two tablets by mouth every morning     . cetirizine (ZYRTEC) 10 MG tablet Take 10 mg by mouth daily.      . Cholecalciferol (VITAMIN D) 2000 UNITS CAPS Take 1 capsule by mouth daily.      . clonazePAM (KLONOPIN) 1 MG tablet Take 1 mg by mouth at bedtime as needed.      Marland Kitchen FLUoxetine (PROZAC) 20 MG capsule Take 20 mg by mouth daily.    . fluticasone (FLONASE) 50 MCG/ACT nasal spray Use 2 sprays in each  nostril daily  48 g 1  . lansoprazole (PREVACID) 30 MG capsule Take 1 capsule by mouth  daily 90 capsule 3  . levothyroxine (SYNTHROID, LEVOTHROID) 50 MCG tablet Take 1 tablet by mouth  daily 90 tablet 3  . lovastatin (MEVACOR) 40 MG tablet Take 1 tablet by mouth  daily 90 tablet 3  . Multiple Vitamin (MULTIVITAMIN) tablet Take 1 tablet by mouth daily.      . naproxen (NAPROSYN) 500 MG tablet Take 1 tablet (500 mg total) by mouth 2 (two) times daily with a meal. 20 tablet 0  . Naproxen Sodium (ALEVE PO) Take by mouth. EVERY OTHER DAY    . OLANZapine (ZYPREXA) 2.5 MG tablet Take 2.5 mg by mouth at bedtime.    Marland Kitchen OLANZapine (ZYPREXA) 5 MG tablet     . zolpidem (AMBIEN) 10 MG tablet Take 10 mg by mouth at bedtime.      No current facility-administered medications on file prior to visit.    BP 118/80 mmHg  Pulse 77  Temp(Src) 98.2 F (36.8 C) (Oral)  Ht 5\' 1"  (1.549 m)  Wt 213 lb 6.4 oz (96.798 kg)  BMI 40.34 kg/m2  SpO2 98%    Objective:   Physical Exam  Constitutional: She appears well-nourished. She appears ill.  HENT:  Right Ear: Tympanic membrane and ear canal normal.  Left Ear: Tympanic membrane and ear canal normal.  Nose: Right sinus exhibits maxillary sinus tenderness. Right sinus exhibits no frontal sinus tenderness. Left sinus exhibits maxillary sinus tenderness. Left sinus exhibits no frontal sinus tenderness.  Mouth/Throat: Oropharynx is clear and moist.  Eyes: Conjunctivae are normal.  Neck: Neck supple.  Cardiovascular: Normal rate and regular rhythm.   Pulmonary/Chest: Effort normal. She has no decreased breath sounds. She has rhonchi in the right upper field, the left upper field and the left lower field.  Lymphadenopathy:    She has no cervical adenopathy.  Skin: Skin is warm and dry.          Assessment & Plan:  URI:  Cough, fevers, congestion, fatigue, body aches x 2 days. Exam today with moderate rhonchi to bilateral upper fields and left lower field. Appears  ill. Rapid Flu: Negative. Given presentation, fevers over 101, and examination, will treat. RX for Zpak provided. Continue Mucinex and Tylenol. Increase fluids and rest. Return precautions provided.

## 2016-04-03 ENCOUNTER — Emergency Department
Admission: EM | Admit: 2016-04-03 | Discharge: 2016-04-03 | Disposition: A | Discharge status: Home / Self Care | Payer: Commercial Managed Care - HMO | Attending: Emergency Medicine | Admitting: Emergency Medicine

## 2016-04-03 ENCOUNTER — Telehealth: Payer: Self-pay | Admitting: Family Medicine

## 2016-04-03 ENCOUNTER — Encounter: Payer: Self-pay | Admitting: Emergency Medicine

## 2016-04-03 DIAGNOSIS — E039 Hypothyroidism, unspecified: Secondary | ICD-10-CM | POA: Diagnosis not present

## 2016-04-03 DIAGNOSIS — F319 Bipolar disorder, unspecified: Secondary | ICD-10-CM | POA: Diagnosis not present

## 2016-04-03 DIAGNOSIS — R197 Diarrhea, unspecified: Secondary | ICD-10-CM | POA: Diagnosis not present

## 2016-04-03 DIAGNOSIS — Z85828 Personal history of other malignant neoplasm of skin: Secondary | ICD-10-CM | POA: Insufficient documentation

## 2016-04-03 DIAGNOSIS — M62838 Other muscle spasm: Secondary | ICD-10-CM | POA: Diagnosis not present

## 2016-04-03 DIAGNOSIS — Z7982 Long term (current) use of aspirin: Secondary | ICD-10-CM | POA: Insufficient documentation

## 2016-04-03 DIAGNOSIS — J069 Acute upper respiratory infection, unspecified: Secondary | ICD-10-CM

## 2016-04-03 DIAGNOSIS — R195 Other fecal abnormalities: Secondary | ICD-10-CM

## 2016-04-03 DIAGNOSIS — M1990 Unspecified osteoarthritis, unspecified site: Secondary | ICD-10-CM | POA: Diagnosis not present

## 2016-04-03 DIAGNOSIS — E669 Obesity, unspecified: Secondary | ICD-10-CM | POA: Insufficient documentation

## 2016-04-03 DIAGNOSIS — Z79899 Other long term (current) drug therapy: Secondary | ICD-10-CM | POA: Diagnosis not present

## 2016-04-03 DIAGNOSIS — E785 Hyperlipidemia, unspecified: Secondary | ICD-10-CM | POA: Insufficient documentation

## 2016-04-03 NOTE — Telephone Encounter (Signed)
Aware- looks like she is in the ED (in epic)
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
## 2016-04-03 NOTE — ED Notes (Signed)
Pt informed to return if any life threatening symptoms occur.  
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
## 2016-04-03 NOTE — Discharge Instructions (Signed)

## 2016-04-09 ENCOUNTER — Telehealth: Payer: Self-pay | Admitting: Family Medicine
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
## 2016-04-09 NOTE — Telephone Encounter (Signed)
Yes-it is normal usually and can persist for quite a while Where is it?   Is it red or hot at all?  thanks

## 2016-04-18 ENCOUNTER — Ambulatory Visit (INDEPENDENT_AMBULATORY_CARE_PROVIDER_SITE_OTHER): Payer: Commercial Managed Care - HMO | Admitting: Family Medicine

## 2016-04-18 ENCOUNTER — Encounter: Payer: Self-pay | Admitting: Family Medicine

## 2016-04-18 VITALS — BP 116/68 | HR 61 | Temp 97.8°F | Ht 61.0 in | Wt 213.0 lb

## 2016-04-18 DIAGNOSIS — R05 Cough: Secondary | ICD-10-CM | POA: Diagnosis not present

## 2016-04-18 DIAGNOSIS — R058 Other specified cough: Secondary | ICD-10-CM | POA: Insufficient documentation

## 2016-04-18 DIAGNOSIS — R059 Cough, unspecified: Secondary | ICD-10-CM | POA: Insufficient documentation

## 2016-04-18 MED ORDER — BENZONATATE 200 MG PO CAPS: 200.0000 mg | ORAL_CAPSULE | Freq: Three times a day (TID) | ORAL | Status: DC | PRN

## 2016-04-18 NOTE — Progress Notes (Signed)
Pre visit review using our clinic review tool, if applicable. No additional management support is needed unless otherwise documented below in the visit note. 

## 2016-04-18 NOTE — Patient Instructions (Signed)
I think have a post illness cough syndrome ( a cycle where the cough causes more cough)  Reassuring exam and reassured by the fact you are improving Continue the tessalon three times daily  mucinex DM is fine if helpful  Drink fluids  It still may be a bit of time before you get back to exercise - stick to walking for now  If fever returns or symptoms worsen let me know   Take care of yourself
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
## 2016-04-19 NOTE — Assessment & Plan Note (Signed)
Post viral cough syndrome  Reassuring/much improved exam  Refilled tessalon mucinex DM prn  Disc symptomatic care - see instructions on AVS  Update if not starting to improve in a week or if worsening

## 2016-06-04 DIAGNOSIS — I788 Other diseases of capillaries: Secondary | ICD-10-CM | POA: Diagnosis not present

## 2016-06-04 DIAGNOSIS — L578 Other skin changes due to chronic exposure to nonionizing radiation: Secondary | ICD-10-CM | POA: Diagnosis not present

## 2016-06-04 DIAGNOSIS — D229 Melanocytic nevi, unspecified: Secondary | ICD-10-CM | POA: Diagnosis not present

## 2016-06-04 DIAGNOSIS — D1800 Hemangioma unspecified site: Secondary | ICD-10-CM | POA: Diagnosis not present

## 2016-06-04 DIAGNOSIS — L821 Other seborrheic keratosis: Secondary | ICD-10-CM | POA: Diagnosis not present

## 2016-06-04 DIAGNOSIS — Z1283 Encounter for screening for malignant neoplasm of skin: Secondary | ICD-10-CM | POA: Diagnosis not present

## 2016-06-04 DIAGNOSIS — L570 Actinic keratosis: Secondary | ICD-10-CM | POA: Diagnosis not present

## 2016-06-04 DIAGNOSIS — L814 Other melanin hyperpigmentation: Secondary | ICD-10-CM | POA: Diagnosis not present

## 2016-06-04 DIAGNOSIS — Z85828 Personal history of other malignant neoplasm of skin: Secondary | ICD-10-CM | POA: Diagnosis not present

## 2016-06-28 DIAGNOSIS — F314 Bipolar disorder, current episode depressed, severe, without psychotic features: Secondary | ICD-10-CM | POA: Diagnosis not present

## 2016-07-24 ENCOUNTER — Ambulatory Visit (INDEPENDENT_AMBULATORY_CARE_PROVIDER_SITE_OTHER): Payer: Commercial Managed Care - HMO | Admitting: Family Medicine

## 2016-07-24 ENCOUNTER — Encounter: Payer: Self-pay | Admitting: Family Medicine

## 2016-07-24 DIAGNOSIS — M25562 Pain in left knee: Secondary | ICD-10-CM | POA: Insufficient documentation

## 2016-07-24 NOTE — Progress Notes (Signed)
Subjective:   Patient ID: [name] [name], female    DOB: 12/29/1955, 60 y.o.   MRN: [id number]  [name] [name] is a pleasant 60 y.o. year old female pt of Dr. Glori Bickers, new to me, who presents to clinic today with Joint Swelling (knees after attending [employer])  on 07/24/2016  HPI:  Left knee injury- was at [employer] last week and taking a tough class.  Felt great during class but toward the end of the day, swelling and pain above her left knee.  It went down and then yesterday she took the same class.  No pain during class but again had some swelling and pain at that end of the day.  Both have again resolved.  Alleve has helped.  Current Outpatient Prescriptions on File Prior to Visit  Medication Sig Dispense Refill  . ARIPiprazole (ABILIFY) 30 MG tablet Take 30 mg by mouth daily.      Marland Kitchen aspirin 81 MG tablet Take 81 mg by mouth daily.      Marland Kitchen buPROPion (WELLBUTRIN XL) 150 MG 24 hr tablet Take two tablets by mouth every morning     . cetirizine (ZYRTEC) 10 MG tablet Take 10 mg by mouth daily.      . Cholecalciferol (VITAMIN D) 2000 UNITS CAPS Take 1 capsule by mouth daily.      . clonazePAM (KLONOPIN) 1 MG tablet Take 1 mg by mouth at bedtime as needed.      Marland Kitchen FLUoxetine (PROZAC) 20 MG capsule Take 20 mg by mouth daily.    . fluticasone (FLONASE) 50 MCG/ACT nasal spray Use 2 sprays in each  nostril daily 48 g 1  . lansoprazole (PREVACID) 30 MG capsule Take 1 capsule by mouth  daily 90 capsule 3  . levothyroxine (SYNTHROID, LEVOTHROID) 50 MCG tablet Take 1 tablet by mouth  daily 90 tablet 3  . lovastatin (MEVACOR) 40 MG tablet Take 1 tablet by mouth  daily 90 tablet 3  . Multiple Vitamin (MULTIVITAMIN) tablet Take 1 tablet by mouth daily.      . naproxen (NAPROSYN) 500 MG tablet Take 1 tablet (500 mg total) by mouth 2 (two) times daily with a meal. 20 tablet 0  . Naproxen Sodium (ALEVE PO) Take by mouth. EVERY OTHER DAY    . OLANZapine (ZYPREXA) 2.5 MG tablet Take 2.5 mg by  mouth at bedtime.    Marland Kitchen zolpidem (AMBIEN) 10 MG tablet Take 10 mg by mouth at bedtime.      No current facility-administered medications on file prior to visit.     Allergies  Allergen Reactions  . Xylocaine [Lidocaine Hcl] Swelling    ALLERGIC TO ALL CAINES EXCEPT SENSORCAINE , AND MARCAINE  . Codeine Nausea And Vomiting    REACTION: vomits blood  . Lamictal [Lamotrigine] Other (See Comments)    REACTION: sores in mouth, muscle aches and also made her manic bipolar  . Lithium Swelling  . Tegretol [Carbamazepine] Other (See Comments)    REACTION: skin crawls    Past Medical History:  Diagnosis Date  . Allergic rhinitis   . Bipolar 1 disorder ([place])   . Fissure, anal   . GERD (gastroesophageal reflux disease)   . History of basal cell carcinoma excision    ON FACE  . History of squamous cell carcinoma excision    ON RIGHT LEG  . Hyperlipidemia   . Hypothyroidism   . IBS (irritable bowel syndrome)   . Liver cyst   . Mallet finger  of left hand    small finger    Past Surgical History:  Procedure Laterality Date  . CARDIAC CATHETERIZATION  04/1999   per pt normal  . CARDIOVASCULAR STRESS TEST  02-23-2011  dr Chrissie Noa fath [facility] clinic)   normal nuclear study/  no ischemia/  ef 64%  . CATARACT EXTRACTION W/ INTRAOCULAR LENS  IMPLANT, BILATERAL  2003   Bil  . CESAREAN SECTION  1984  . CLOSED REDUCTION FINGER WITH PERCUTANEOUS PINNING Left 04/22/2014   Procedure: LEFT SMALL FINGER CLOSED REDUCTION PINNING;  Surgeon: Linna Hoff, MD;  Location: [place];  Service: Orthopedics;  Laterality: Left;  . COLONOSCOPY  11/2002  . LAPAROSCOPIC CHOLECYSTECTOMY  03-18-2000  . TRANSTHORACIC ECHOCARDIOGRAM  02-23-2011   normal lvf/  ef 58%/  mild rve/   mild lae/  mild  mr  & tr/  mild pulmonary htn  . WISDOM TOOTH EXTRACTION      Family History  Problem Relation Age of Onset  . Stroke Mother   . Heart disease Mother   . Hypertension Father   . Diabetes  Father   . Hyperlipidemia Father   . Diabetes Brother     Social History   Social History  . Marital status: Married    Spouse name: N/A  . Number of children: 1  . Years of education: N/A   Occupational History  . Home Retired   Social History Main Topics  . Smoking status: Never Smoker  . Smokeless tobacco: Never Used  . Alcohol use No  . Drug use: No  . Sexual activity: Not on file   Other Topics Concern  . Not on file   Social History Narrative   Moved here from [place]   The PMH, [place], Social History, Family History, Medications, and allergies have been reviewed in [facility], and have been updated if relevant.    Review of Systems  Musculoskeletal: Positive for arthralgias and joint swelling. Negative for back pain, gait problem and myalgias.  All other systems reviewed and are negative.      Objective:    BP 124/62   Pulse (!) 58   Temp 98 F (36.7 C) (Oral)   Wt 212 lb 8 oz (96.4 kg)   SpO2 97%   BMI 40.15 kg/m    Physical Exam  Constitutional: She is oriented to person, place, and time. She appears well-developed and well-nourished. No distress.  HENT:  Head: Normocephalic.  Eyes: Conjunctivae are normal.  Cardiovascular: Normal rate.   Pulmonary/Chest: Effort normal.  Musculoskeletal:  Difficult to assess knees due to body habitus but I do not feel any effusion of left knee.  No visible abnormality.  No pain with ROM, redness or erythema.  No pain to palpation over joint line or laxity.  Neurological: She is alert and oriented to person, place, and time. No cranial nerve deficit.  Skin: Skin is warm and dry. She is not diaphoretic.  Psychiatric: She has a normal mood and affect. Her behavior is normal. Judgment and thought content normal.  Nursing note and vitals reviewed.         Assessment & Plan:   Left knee pain No Follow-up on file.

## 2016-07-24 NOTE — Progress Notes (Signed)
Pre visit review using our clinic review tool, if applicable. No additional management support is needed unless otherwise documented below in the visit note. 

## 2016-07-24 NOTE — Patient Instructions (Signed)
Great to see you. Please continue icing your knee, keep it elevated and taking Alleve as you have been doing.  Keep [place] updated.

## 2016-07-24 NOTE — Assessment & Plan Note (Signed)
New- history and exam reassuring. Probable mild sprain. Advised to continue activity since it is not worsening pain. Ice after class, elevate knee and continue prn NSAIDs with food. Call or return to clinic prn if these symptoms worsen or fail to improve as anticipated. The patient indicates understanding of these issues and agrees with the plan.

## 2016-07-25 ENCOUNTER — Telehealth: Payer: Self-pay | Admitting: *Deleted

## 2016-07-25 NOTE — Telephone Encounter (Signed)
-----   Message from Lucille Passy, MD sent at 07/24/2016  9:11 AM EDT ----- Darin Engels, I am seeing this pt for knee pain.  She is asking that you and Dr. Glori Bickers refill her chronic medications. Thanks!

## 2016-07-25 NOTE — Telephone Encounter (Signed)
That is fine - please schedule annual exam in the winter and refill until then

## 2016-07-25 NOTE — Telephone Encounter (Signed)
See Dr. Hulen Shouts message she sent me, pt has had a lot of acute appts with you and other providers (just saw Dr. Deborra Medina yesterday), but no recent CPE or f/u, is it okay to fill pt's chronic meds? Please advise

## 2016-07-26 MED ORDER — FLUTICASONE PROPIONATE 50 MCG/ACT NA SUSP: 2.0000 | Freq: Every day | NASAL | 1 refills | Status: DC

## 2016-07-26 MED ORDER — LANSOPRAZOLE 30 MG PO CPDR: DELAYED_RELEASE_CAPSULE | ORAL | 1 refills | Status: DC

## 2016-07-26 MED ORDER — LOVASTATIN 40 MG PO TABS: ORAL_TABLET | ORAL | 1 refills | Status: DC

## 2016-07-26 MED ORDER — LEVOTHYROXINE SODIUM 50 MCG PO TABS: ORAL_TABLET | ORAL | 1 refills | Status: DC

## 2016-07-26 NOTE — Telephone Encounter (Signed)
done

## 2016-07-26 NOTE — Telephone Encounter (Signed)
Pt left v/m; med refills done on 07/26/16 were sent to [facility] and pt wanted meds to go to [place]. Pt request cb when corrected.

## 2016-07-27 MED ORDER — FLUTICASONE PROPIONATE 50 MCG/ACT NA SUSP: 2.0000 | Freq: Every day | NASAL | 1 refills | Status: DC

## 2016-07-27 MED ORDER — LEVOTHYROXINE SODIUM 50 MCG PO TABS: ORAL_TABLET | ORAL | 1 refills | Status: DC

## 2016-07-27 MED ORDER — LOVASTATIN 40 MG PO TABS: ORAL_TABLET | ORAL | 1 refills | Status: DC

## 2016-07-27 MED ORDER — LANSOPRAZOLE 30 MG PO CPDR: DELAYED_RELEASE_CAPSULE | ORAL | 1 refills | Status: DC

## 2016-07-27 NOTE — Telephone Encounter (Signed)
Rx sent and pt's spouse notified and he will advise pt Rxs were sent

## 2016-07-27 NOTE — Addendum Note (Signed)
Addended by: Tammi Sou on: 07/27/2016 10:30 AM   Modules accepted: Orders

## 2016-08-21 ENCOUNTER — Other Ambulatory Visit: Payer: Self-pay | Admitting: Family Medicine

## 2016-09-10 ENCOUNTER — Other Ambulatory Visit: Payer: Self-pay

## 2016-09-10 MED ORDER — LOVASTATIN 40 MG PO TABS: ORAL_TABLET | ORAL | 1 refills | Status: DC

## 2016-09-17 ENCOUNTER — Telehealth: Payer: Self-pay | Admitting: Family Medicine

## 2016-09-17 DIAGNOSIS — N390 Urinary tract infection, site not specified: Secondary | ICD-10-CM | POA: Diagnosis not present

## 2016-09-17 NOTE — Telephone Encounter (Signed)
I spoke with pt and she is at [facility] now waiting to be seen.FYI to Dr Glori Bickers.

## 2016-09-17 NOTE — Telephone Encounter (Signed)
Patient Name: [name] [name]  DOB: 1956-03-29    Initial Comment Caller states, she might be having appendicitis symptoms pain on her right side ? Verified.    Nurse Assessment  Nurse: Leilani Merl, RN, Heather Date/Time (Eastern Time): 09/17/2016 2:43:11 PM  Confirm and document reason for call. If symptomatic, describe symptoms. You must click the next button to save text entered. ---Caller states, she might be having appendicitis symptoms pain on her right side . It started this morning around 11 am  Has the patient traveled out of the country within the last 30 days? ---Not Applicable  Does the patient have any new or worsening symptoms? ---Yes  Will a triage be completed? ---Yes  Related visit to physician within the last 2 weeks? ---No  Does the PT have any chronic conditions? (i.e. diabetes, asthma, etc.) ---Yes  List chronic conditions. ---See MR  Is this a behavioral health or substance abuse call? ---No     Guidelines    Guideline Title Affirmed Question Affirmed Notes  Abdominal Pain - Female Patient sounds very sick or weak to the triager    Final Disposition User   Go to ED Now (or PCP triage) Leilani Merl, RN, Heather    Referrals  [facility] [place]   Disagree/Comply: Comply

## 2016-09-17 NOTE — Telephone Encounter (Signed)
Thanks for letting me know - re assured she is at a medical office  Please call her tomorrow to see how it went and how she is feeling-thanks

## 2016-09-18 NOTE — Telephone Encounter (Signed)
Spoke to pt. She is doing much better. She got an antibiotic for a kidney infection.

## 2016-10-08 ENCOUNTER — Ambulatory Visit (INDEPENDENT_AMBULATORY_CARE_PROVIDER_SITE_OTHER)
Admission: RE | Admit: 2016-10-08 | Discharge: 2016-10-08 | Disposition: A | Discharge status: Home / Self Care | Payer: Commercial Managed Care - HMO | Source: Ambulatory Visit | Attending: Family Medicine | Admitting: Family Medicine

## 2016-10-08 ENCOUNTER — Telehealth: Payer: Self-pay | Admitting: Family Medicine

## 2016-10-08 ENCOUNTER — Encounter: Payer: Self-pay | Admitting: Family Medicine

## 2016-10-08 ENCOUNTER — Ambulatory Visit (INDEPENDENT_AMBULATORY_CARE_PROVIDER_SITE_OTHER): Payer: Commercial Managed Care - HMO | Admitting: Family Medicine

## 2016-10-08 VITALS — BP 128/74 | HR 69 | Temp 98.4°F | Ht 61.0 in | Wt 216.0 lb

## 2016-10-08 DIAGNOSIS — M25562 Pain in left knee: Secondary | ICD-10-CM | POA: Diagnosis not present

## 2016-10-08 DIAGNOSIS — M179 Osteoarthritis of knee, unspecified: Secondary | ICD-10-CM | POA: Diagnosis not present

## 2016-10-08 DIAGNOSIS — G8929 Other chronic pain: Secondary | ICD-10-CM
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
## 2016-10-08 NOTE — Telephone Encounter (Signed)
Done Will route to [facility] 

## 2016-10-08 NOTE — Assessment & Plan Note (Signed)
She would certainly benefit from wt loss regarding knee and joint pain  Goal is to keep her as active as possible  Will try the recumbent bike

## 2016-10-08 NOTE — Patient Instructions (Signed)
Knee xray today  I think you are having some overuse issues  Limit your class to once per week  Start trying the recumbent exercise bike - see how that feels  Get a knee sleeve (neoprene or elastic) - to help stabilize the knee when exercising  Continue ice and aleve if needed  When not active - elevate the leg also   We will contact you with a result

## 2016-10-08 NOTE — Telephone Encounter (Signed)
-----   Message from Tammi Sou, [place] sent at 10/08/2016  4:54 PM EDT ----- Pt notified of xray results and Dr. Marliss Coots comments. Pt agrees with referral to Ortho, would prefer [place] if possible, please put referral in and I advised pt our [facility] will call to schedule appt

## 2016-10-08 NOTE — Assessment & Plan Note (Signed)
Much of her pain is anterior/ rarely lateral, and she c/o of tightness Most tender in patellofemoral area  Patella is not particularly mobile  Swelling or effusion is difficult to eval for given body habitus  Likely overuse syndrome in combo with obesity Asked her to limit her mod impact classes and use the recumbent bike more  Ice/nsaid prn  Will look for a stabilizing knee sleeve Xray today and update from there

## 2016-10-08 NOTE — Progress Notes (Signed)
Pre visit review using our clinic review tool, if applicable. No additional management support is needed unless otherwise documented below in the visit note. 

## 2016-10-18 DIAGNOSIS — M7052 Other bursitis of knee, left knee: Secondary | ICD-10-CM | POA: Diagnosis not present

## 2016-10-18 DIAGNOSIS — M7632 Iliotibial band syndrome, left leg: Secondary | ICD-10-CM | POA: Diagnosis not present

## 2016-10-18 DIAGNOSIS — M1712 Unilateral primary osteoarthritis, left knee: Secondary | ICD-10-CM | POA: Diagnosis not present

## 2016-11-12 ENCOUNTER — Other Ambulatory Visit: Payer: Self-pay | Admitting: Family Medicine

## 2016-11-12 DIAGNOSIS — Z1231 Encounter for screening mammogram for malignant neoplasm of breast: Secondary | ICD-10-CM

## 2016-11-15 DIAGNOSIS — F314 Bipolar disorder, current episode depressed, severe, without psychotic features: Secondary | ICD-10-CM | POA: Diagnosis not present

## 2016-12-05 IMAGING — DX DG KNEE AP/LAT W/ SUNRISE*L*
4 series · 4 of 4 positions shown · non-contrast
Comparison: 11/27/2013

CLINICAL DATA: Anterior knee pain, no known injury, initial
encounter

EXAM:
LEFT KNEE 3 VIEWS

[knee ap]
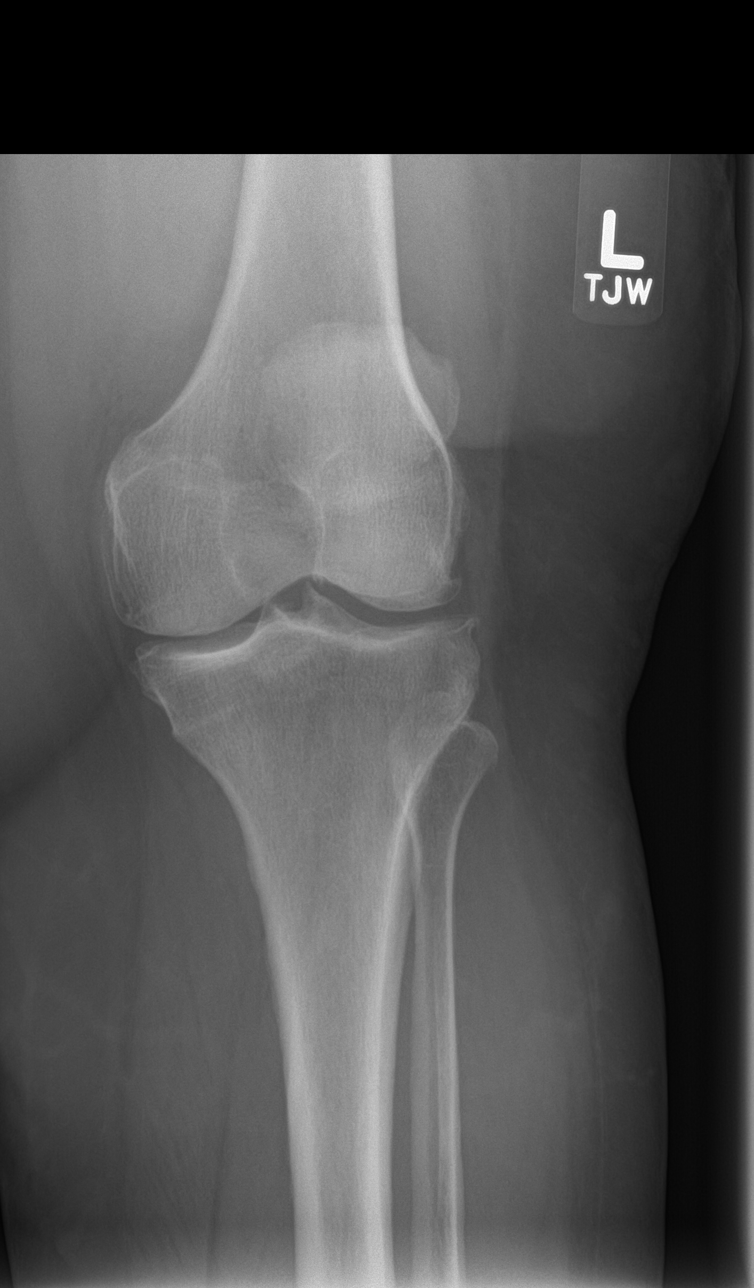

[knee lat]
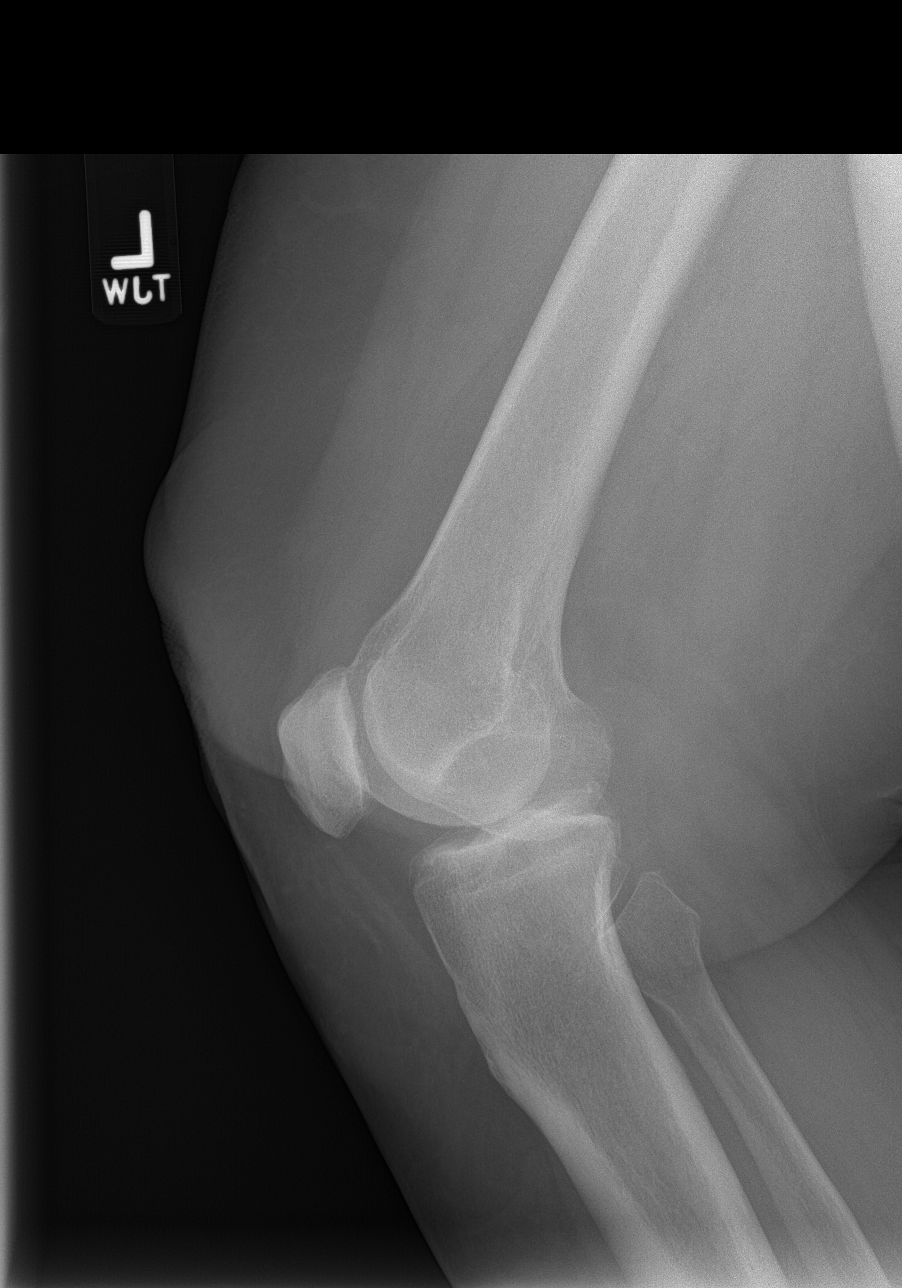

[patella skyline]
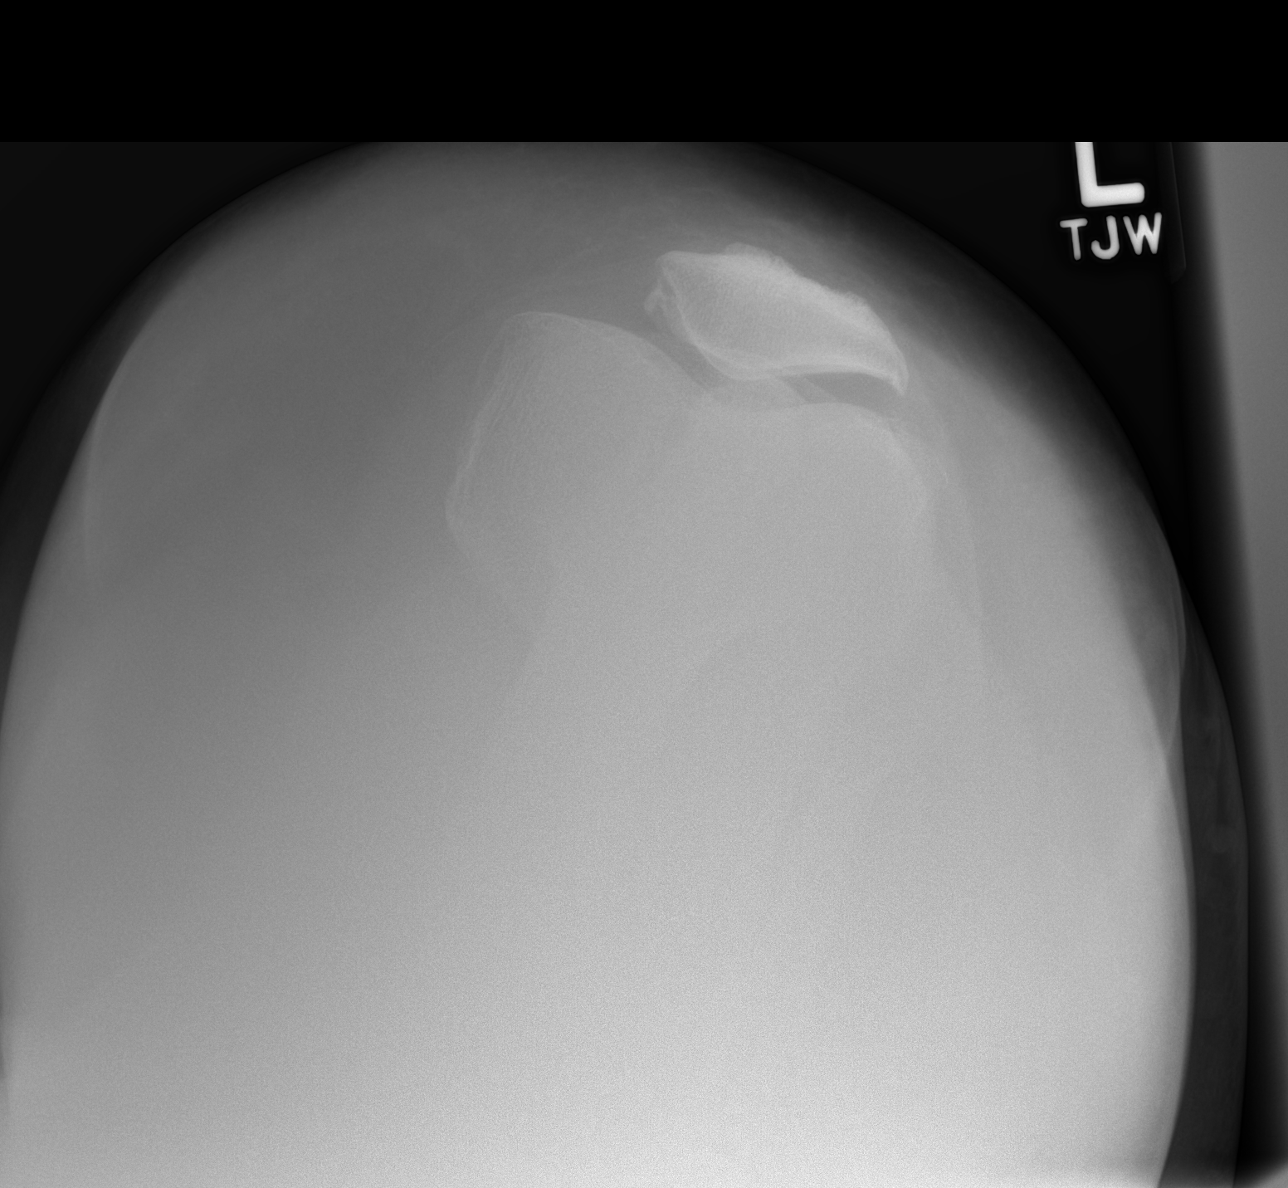

[knee obl]
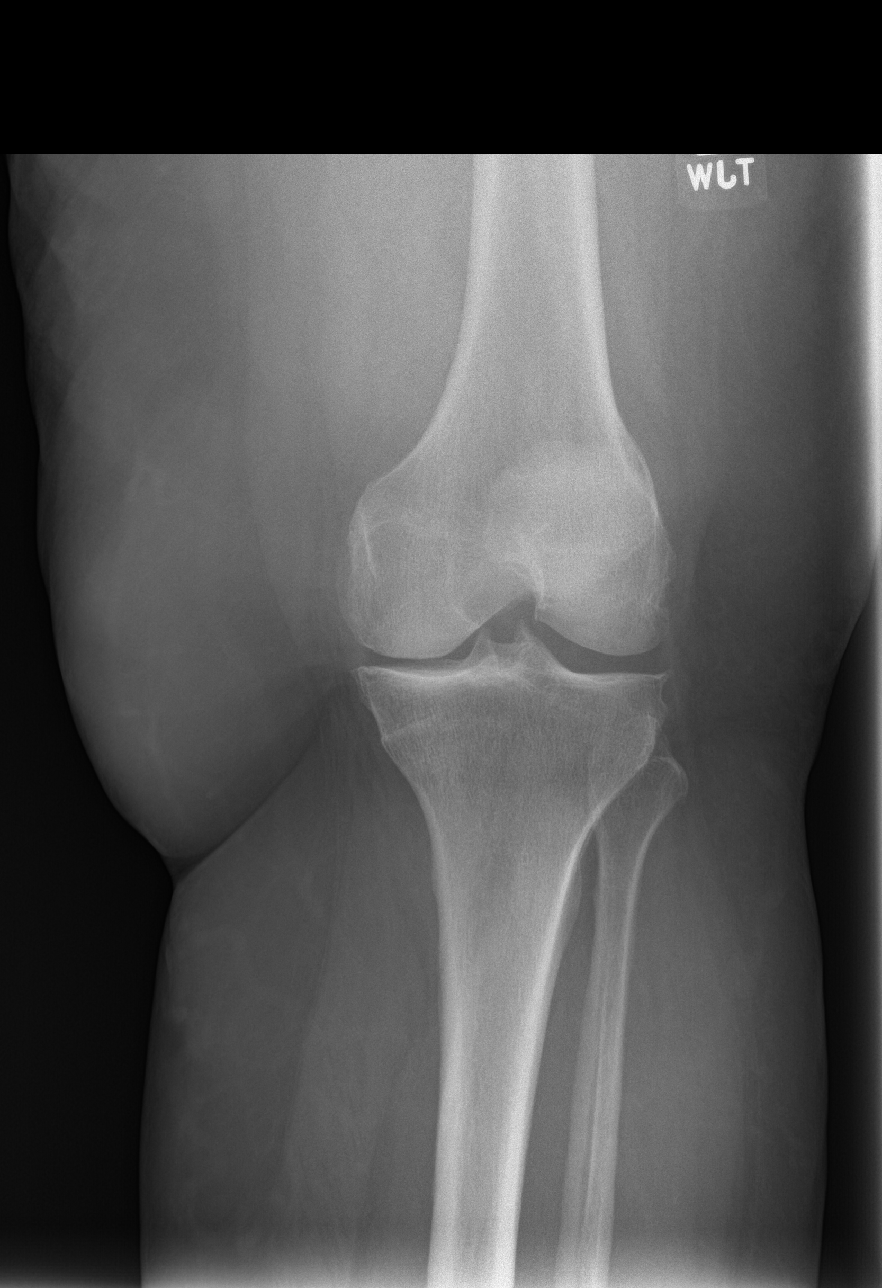

[4 of 4 positions shown; findings below may reference images not displayed]

FINDINGS: Tricompartmental degenerative changes are again seen. No joint
effusion is noted. No acute fracture or dislocation is seen.
IMPRESSION: Tricompartmental degenerative changes similar to that seen on the
prior exam.

## 2016-12-19 ENCOUNTER — Ambulatory Visit: Payer: Commercial Managed Care - HMO | Admitting: Primary Care

## 2016-12-22 ENCOUNTER — Telehealth: Payer: Self-pay | Admitting: Family Medicine

## 2016-12-22 DIAGNOSIS — Z0000 Encounter for general adult medical examination without abnormal findings: Secondary | ICD-10-CM

## 2016-12-22 DIAGNOSIS — Z1159 Encounter for screening for other viral diseases: Secondary | ICD-10-CM | POA: Insufficient documentation

## 2016-12-22 NOTE — Telephone Encounter (Signed)
-----   Message from Eustace Pen, LPN sent at [id number]  6:09 PM EST ----- Regarding: Lab Orders 12/28/16 Please add Hep C to lab orders. Thank you.

## 2016-12-25 ENCOUNTER — Encounter: Payer: Self-pay | Admitting: Family Medicine

## 2016-12-25 ENCOUNTER — Ambulatory Visit
Admission: RE | Admit: 2016-12-25 | Discharge: 2016-12-25 | Disposition: A | Discharge status: Home / Self Care | Payer: Medicare HMO | Source: Ambulatory Visit | Attending: Family Medicine | Admitting: Family Medicine

## 2016-12-25 ENCOUNTER — Ambulatory Visit (INDEPENDENT_AMBULATORY_CARE_PROVIDER_SITE_OTHER): Payer: Medicare HMO | Admitting: Family Medicine

## 2016-12-25 VITALS — BP 128/82 | HR 73 | Temp 97.9°F | Ht 61.0 in | Wt 217.2 lb

## 2016-12-25 DIAGNOSIS — Z1231 Encounter for screening mammogram for malignant neoplasm of breast: Secondary | ICD-10-CM | POA: Diagnosis not present

## 2016-12-25 DIAGNOSIS — N950 Postmenopausal bleeding: Secondary | ICD-10-CM | POA: Diagnosis not present

## 2016-12-25 DIAGNOSIS — F3177 Bipolar disorder, in partial remission, most recent episode mixed: Secondary | ICD-10-CM

## 2016-12-25 DIAGNOSIS — J209 Acute bronchitis, unspecified: Secondary | ICD-10-CM | POA: Diagnosis not present

## 2016-12-25 MED ORDER — HYDROCODONE-HOMATROPINE 5-1.5 MG/5ML PO SYRP: 5.0000 mL | ORAL_SOLUTION | Freq: Four times a day (QID) | ORAL | 0 refills | Status: DC | PRN

## 2016-12-25 MED ORDER — BENZONATATE 200 MG PO CAPS: 200.0000 mg | ORAL_CAPSULE | Freq: Three times a day (TID) | ORAL | 1 refills | Status: DC | PRN

## 2016-12-25 MED ORDER — AZITHROMYCIN 250 MG PO TABS: ORAL_TABLET | ORAL | 0 refills | Status: DC

## 2016-12-25 NOTE — Assessment & Plan Note (Signed)
Pt is at high risk of endometrial hyperplasia at this weight   Discussed how this problem influences overall health and the risks it imposes  Reviewed plan for weight loss with lower calorie diet (via better food choices and also portion control or program like weight watchers) and exercise building up to or more than 30 minutes 5 days per week including some aerobic activity

## 2016-12-25 NOTE — Progress Notes (Signed)
Pre visit review using our clinic review tool, if applicable. No additional management support is needed unless otherwise documented below in the visit note. 
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
## 2016-12-25 NOTE — Patient Instructions (Signed)
Take care of yourself  Tessalon for cough  Take the zpak Hycodan for more severe cough with caution of sedatoin  Drink lots of fluids  Stop at check out for referral to gyn   Update if not starting to improve in a week or if worsening

## 2016-12-25 NOTE — Assessment & Plan Note (Signed)
With prod cough for 3 weeks with purulent drainage  Will cover with zpak due to length of illness Refilled tessalon 200  Also px hycodan (pt can take hydrocodone with no problem) with warning of sedation  Fluids/rest Disc symptomatic care - see instructions on AVS  Update if not starting to improve in a week or if worsening

## 2016-12-25 NOTE — Assessment & Plan Note (Signed)
Stable/doing well under psych care

## 2016-12-25 NOTE — Assessment & Plan Note (Signed)
Need gyn eval/us and likely endo bx to r/o endometrial hyperplasia or malignancy At risk due to weight  She takes occ nsaid On psychotropic meds Ref done

## 2016-12-28 ENCOUNTER — Ambulatory Visit: Payer: Medicare HMO

## 2016-12-28 ENCOUNTER — Other Ambulatory Visit: Payer: Commercial Managed Care - HMO

## 2016-12-28 ENCOUNTER — Telehealth (INDEPENDENT_AMBULATORY_CARE_PROVIDER_SITE_OTHER): Payer: Medicare HMO | Admitting: Family Medicine

## 2016-12-28 ENCOUNTER — Other Ambulatory Visit (INDEPENDENT_AMBULATORY_CARE_PROVIDER_SITE_OTHER): Payer: Medicare HMO

## 2016-12-28 DIAGNOSIS — Z0000 Encounter for general adult medical examination without abnormal findings: Secondary | ICD-10-CM | POA: Diagnosis not present

## 2016-12-28 DIAGNOSIS — Z1159 Encounter for screening for other viral diseases: Secondary | ICD-10-CM

## 2016-12-28 LAB — LIPID PANEL
Cholesterol: 177 mg/dL (ref 0–200)
HDL: 63.1 mg/dL (ref >39.00)
LDL Cholesterol: 82 mg/dL (ref 0–99)
NONHDL: 114.14
TRIGLYCERIDES: 160 mg/dL — AB (ref 0.0–149.0)
Total CHOL/HDL Ratio: 3
VLDL: 32 mg/dL (ref 0.0–40.0)

## 2016-12-28 LAB — CBC WITH DIFFERENTIAL/PLATELET
BASOS ABS: 0 10*3/uL (ref 0.0–0.1)
Basophils Relative: 0.4 % (ref 0.0–3.0)
EOS PCT: 0.1 % (ref 0.0–5.0)
Eosinophils Absolute: 0 10*3/uL (ref 0.0–0.7)
HCT: 40.1 % (ref 36.0–46.0)
Hemoglobin: 13.6 g/dL (ref 12.0–15.0)
LYMPHS ABS: 1.5 10*3/uL (ref 0.7–4.0)
LYMPHS PCT: 27.7 % (ref 12.0–46.0)
MCHC: 33.9 g/dL (ref 30.0–36.0)
MCV: 88.9 fl (ref 78.0–100.0)
MONOS PCT: 5.3 % (ref 3.0–12.0)
Monocytes Absolute: 0.3 10*3/uL (ref 0.1–1.0)
NEUTROS ABS: 3.5 10*3/uL (ref 1.4–7.7)
NEUTROS PCT: 66.5 % (ref 43.0–77.0)
Platelets: 237 10*3/uL (ref 150.0–400.0)
RBC: 4.51 Mil/uL (ref 3.87–5.11)
RDW: 13.4 % (ref 11.5–15.5)
WBC: 5.3 10*3/uL (ref 4.0–10.5)

## 2016-12-28 LAB — COMPREHENSIVE METABOLIC PANEL
ALK PHOS: 75 U/L (ref 39–117)
ALT: 21 U/L (ref 0–35)
AST: 21 U/L (ref 0–37)
Albumin: 4.2 g/dL (ref 3.5–5.2)
BILIRUBIN TOTAL: 0.4 mg/dL (ref 0.2–1.2)
BUN: 16 mg/dL (ref 6–23)
CALCIUM: 9.8 mg/dL (ref 8.4–10.5)
CO2: 28 mEq/L (ref 19–32)
Chloride: 105 mEq/L (ref 96–112)
Creatinine, Ser: 1.09 mg/dL (ref 0.40–1.20)
GFR: 54.64 mL/min — AB (ref >60.00)
GLUCOSE: 98 mg/dL (ref 70–99)
POTASSIUM: 4.6 meq/L (ref 3.5–5.1)
Sodium: 142 mEq/L (ref 135–145)
TOTAL PROTEIN: 7.2 g/dL (ref 6.0–8.3)

## 2016-12-28 LAB — TSH: TSH: 2.12 u[IU]/mL (ref 0.35–4.50)

## 2016-12-28 NOTE — Telephone Encounter (Signed)
Lab order

## 2016-12-29 LAB — HEPATITIS C ANTIBODY: HCV Ab: NEGATIVE

## 2017-01-02 ENCOUNTER — Encounter: Payer: Self-pay | Admitting: Family Medicine

## 2017-01-02 ENCOUNTER — Ambulatory Visit (INDEPENDENT_AMBULATORY_CARE_PROVIDER_SITE_OTHER): Payer: Medicare HMO | Admitting: Family Medicine

## 2017-01-02 VITALS — BP 116/76 | HR 76 | Temp 98.2°F | Ht 60.5 in | Wt 215.5 lb

## 2017-01-02 DIAGNOSIS — F3177 Bipolar disorder, in partial remission, most recent episode mixed: Secondary | ICD-10-CM

## 2017-01-02 DIAGNOSIS — N950 Postmenopausal bleeding: Secondary | ICD-10-CM

## 2017-01-02 DIAGNOSIS — E7800 Pure hypercholesterolemia, unspecified: Secondary | ICD-10-CM

## 2017-01-02 DIAGNOSIS — Z0000 Encounter for general adult medical examination without abnormal findings: Secondary | ICD-10-CM

## 2017-01-02 DIAGNOSIS — Z1159 Encounter for screening for other viral diseases: Secondary | ICD-10-CM | POA: Diagnosis not present

## 2017-01-02 DIAGNOSIS — E039 Hypothyroidism, unspecified: Secondary | ICD-10-CM

## 2017-01-02 MED ORDER — FLUTICASONE PROPIONATE 50 MCG/ACT NA SUSP: 2.0000 | Freq: Every day | NASAL | 3 refills | Status: DC

## 2017-01-02 MED ORDER — LOVASTATIN 40 MG PO TABS: ORAL_TABLET | ORAL | 3 refills | Status: DC

## 2017-01-02 MED ORDER — LANSOPRAZOLE 30 MG PO CPDR: DELAYED_RELEASE_CAPSULE | ORAL | 3 refills | Status: DC

## 2017-01-02 MED ORDER — LEVOTHYROXINE SODIUM 50 MCG PO TABS: ORAL_TABLET | ORAL | 3 refills | Status: DC

## 2017-01-02 NOTE — Progress Notes (Signed)
Subjective:    Patient ID: [name] [name], female    DOB: [birth year], 61 y.o.   MRN: [id number]  HPI Here for health maintenance exam and to review chronic medical problems    Has been feeling pretty good   Wt Readings from Last 3 Encounters:  01/02/17 215 lb 8 oz (97.8 kg)  12/25/16 217 lb 4 oz (98.5 kg)  10/08/16 216 lb (98 kg)  wt is down a few lb - [facility]  Eating healthy also  bmi is 41.3  HIV screening -not interested /not risk  ifob 11/14 neg  Colonoscopy 6/16 normal with 10 year recall   Pap 11/15-normal  Has not had a hysterectomy  Has appt tomorrow with gyn for vaginal bleeding for 3 days   Mammogram 1/18 neg Self breast exam - no lumps   Tetanus shot 10/10  Results for orders placed or performed in visit on 12/28/16  Lipid panel  Result Value Ref Range   Cholesterol 177 0 - 200 mg/dL   Triglycerides 160.0 (H) 0.0 - 149.0 mg/dL   HDL 63.10 >39.00 mg/dL   VLDL 32.0 0.0 - 40.0 mg/dL   LDL Cholesterol 82 0 - 99 mg/dL   Total CHOL/HDL Ratio 3    NonHDL 114.14   CBC with Differential/Platelet  Result Value Ref Range   WBC 5.3 4.0 - 10.5 K/uL   RBC 4.51 3.87 - 5.11 Mil/uL   Hemoglobin 13.6 12.0 - 15.0 g/dL   HCT 40.1 36.0 - 46.0 %   MCV 88.9 78.0 - 100.0 fl   MCHC 33.9 30.0 - 36.0 g/dL   RDW 13.4 11.5 - 15.5 %   Platelets 237.0 150.0 - 400.0 K/uL   Neutrophils Relative % 66.5 43.0 - 77.0 %   Lymphocytes Relative 27.7 12.0 - 46.0 %   Monocytes Relative 5.3 3.0 - 12.0 %   Eosinophils Relative 0.1 0.0 - 5.0 %   Basophils Relative 0.4 0.0 - 3.0 %   Neutro Abs 3.5 1.4 - 7.7 K/uL   Lymphs Abs 1.5 0.7 - 4.0 K/uL   Monocytes Absolute 0.3 0.1 - 1.0 K/uL   Eosinophils Absolute 0.0 0.0 - 0.7 K/uL   Basophils Absolute 0.0 0.0 - 0.1 K/uL  Comprehensive metabolic panel  Result Value Ref Range   Sodium 142 135 - 145 mEq/L   Potassium 4.6 3.5 - 5.1 mEq/L   Chloride 105 96 - 112 mEq/L   CO2 28 19 - 32 mEq/L   Glucose, Bld 98 70 - 99 mg/dL    BUN 16 6 - 23 mg/dL   Creatinine, Ser 1.09 0.40 - 1.20 mg/dL   Total Bilirubin 0.4 0.2 - 1.2 mg/dL   Alkaline Phosphatase 75 39 - 117 U/L   AST 21 0 - 37 U/L   ALT 21 0 - 35 U/L   Total Protein 7.2 6.0 - 8.3 g/dL   Albumin 4.2 3.5 - 5.2 g/dL   Calcium 9.8 8.4 - 10.5 mg/dL   GFR 54.64 (L) >60.00 mL/min  TSH  Result Value Ref Range   TSH 2.12 0.35 - 4.50 uIU/mL     Flu shot 10/17  Hep C screening neg 1/18   Hx of hyperlipidemia Lab Results  Component Value Date   CHOL 177 12/28/2016   CHOL 193 10/19/2015   CHOL 161 10/19/2014   Lab Results  Component Value Date   HDL 63.10 12/28/2016   HDL 72 10/19/2015   HDL 57 10/19/2014  Lab Results  Component Value Date   LDLCALC 82 12/28/2016   LDLCALC 90 10/19/2015   LDLCALC 70 10/19/2014   Lab Results  Component Value Date   TRIG 160.0 (H) 12/28/2016   TRIG 155 (H) 10/19/2015   TRIG 172 (H) 10/19/2014   Lab Results  Component Value Date   CHOLHDL 3 12/28/2016   CHOLHDL 2.7 10/19/2015   CHOLHDL 2.8 10/19/2014   No results found for: LDLDIRECT On lovastatin  Also good diet   Glucose 98  Normal range  Her medicines do make her crave sweets - she just eats a small pc of candy per day   Mood/bipolar - more down lately due to short days and weather  To be expected  Tries to be outside when she can    Patient Active Problem List   Diagnosis Date Noted  . Post-menopausal bleeding 12/25/2016  . Need for hepatitis C screening test 12/22/2016  . Left anterior knee pain 10/08/2016  . Left knee pain 07/24/2016  . Hearing loss 10/25/2015  . Routine general medical examination at a health care facility 10/17/2015  . Encounter for routine gynecological examination 10/22/2014  . Colon cancer screening 10/16/2013  . Encounter for Medicare annual wellness exam 10/04/2011  . Tinea corporis 09/05/2011  . METATARSALGIA 12/01/2009  . POSTMENOPAUSAL STATUS 09/30/2009  . ARTHRITIS, RIGHT HIP 03/10/2009  . DEGENERATIVE  DISC DISEASE, LUMBAR SPINE 03/10/2009  . ONYCHOMYCOSIS 09/17/2008  . Hypothyroidism 08/05/2007  . Morbid obesity ([place]) 08/05/2007  . Bipolar disorder ([place]) 08/05/2007  . ALLERGIC RHINITIS 08/05/2007  . GERD 08/05/2007  . IBS 08/05/2007  . HYPERCHOLESTEROLEMIA, PURE 07/07/2007   Past Medical History:  Diagnosis Date  . Allergic rhinitis   . Bipolar 1 disorder ([place])   . Fissure, anal   . GERD (gastroesophageal reflux disease)   . History of basal cell carcinoma excision    ON FACE  . History of squamous cell carcinoma excision    ON RIGHT LEG  . Hyperlipidemia   . Hypothyroidism   . IBS (irritable bowel syndrome)   . Liver cyst   . Mallet finger of left hand    small finger   Past Surgical History:  Procedure Laterality Date  . CARDIAC CATHETERIZATION  04/1999   per pt normal  . CARDIOVASCULAR STRESS TEST  02-23-2011  dr Chrissie Noa [facility] clinic)   normal nuclear study/  no ischemia/  ef 64%  . CATARACT EXTRACTION W/ INTRAOCULAR LENS  IMPLANT, BILATERAL  2003   Bil  . CESAREAN SECTION  1984  . CLOSED REDUCTION FINGER WITH PERCUTANEOUS PINNING Left 04/22/2014   Procedure: LEFT SMALL FINGER CLOSED REDUCTION PINNING;  Surgeon: Linna Hoff, MD;  Location: [facility];  Service: Orthopedics;  Laterality: Left;  . COLONOSCOPY  11/2002  . LAPAROSCOPIC CHOLECYSTECTOMY  03-18-2000  . TRANSTHORACIC ECHOCARDIOGRAM  02-23-2011   normal lvf/  ef 58%/  mild rve/   mild lae/  mild  mr  & tr/  mild pulmonary htn  . WISDOM TOOTH EXTRACTION     Social History  Substance Use Topics  . Smoking status: Never Smoker  . Smokeless tobacco: Never Used  . Alcohol use No   Family History  Problem Relation Age of Onset  . Stroke Mother   . Heart disease Mother   . Hypertension Father   . Diabetes Father   . Hyperlipidemia Father   . Diabetes Brother    Allergies  Allergen Reactions  . Xylocaine [Lidocaine Hcl] Swelling  ALLERGIC TO ALL CAINES EXCEPT SENSORCAINE  , AND MARCAINE  . Codeine Nausea And Vomiting    REACTION: vomits blood  . Lamictal [Lamotrigine] Other (See Comments)    REACTION: sores in mouth, muscle aches and also made her manic bipolar  . Lithium Swelling  . Tegretol [Carbamazepine] Other (See Comments)    REACTION: skin crawls   Current Outpatient Prescriptions on File Prior to Visit  Medication Sig Dispense Refill  . ARIPiprazole (ABILIFY) 30 MG tablet Take 30 mg by mouth daily.      Marland Kitchen aspirin 81 MG tablet Take 81 mg by mouth daily.      . benzonatate (TESSALON) 200 MG capsule Take 1 capsule (200 mg total) by mouth 3 (three) times daily as needed for cough. Swallow whole, do not bite pill 30 capsule 1  . buPROPion (WELLBUTRIN XL) 150 MG 24 hr tablet Take two tablets by mouth every morning     . cetirizine (ZYRTEC) 10 MG tablet Take 10 mg by mouth daily.      . Cholecalciferol (VITAMIN D) 2000 UNITS CAPS Take 1 capsule by mouth daily.      . clonazePAM (KLONOPIN) 1 MG tablet Take 1 mg by mouth at bedtime as needed.      . diclofenac (VOLTAREN) 75 MG EC tablet Take 75 mg by mouth 2 (two) times daily as needed.  3  . famotidine (PEPCID) 20 MG tablet Take 20 mg by mouth daily.    Marland Kitchen FLUoxetine (PROZAC) 20 MG capsule Take 20 mg by mouth daily.    Marland Kitchen HYDROcodone-homatropine (HYCODAN) 5-1.5 MG/5ML syrup Take 5 mLs by mouth every 6 (six) hours as needed for cough. Caution may sedate 120 mL 0  . Multiple Vitamin (MULTIVITAMIN) tablet Take 1 tablet by mouth daily.      . naproxen (NAPROSYN) 500 MG tablet Take 1 tablet (500 mg total) by mouth 2 (two) times daily with a meal. 20 tablet 0  . Naproxen Sodium (ALEVE PO) Take by mouth. EVERY OTHER DAY    . OLANZapine (ZYPREXA) 2.5 MG tablet Take 2.5 mg by mouth at bedtime.    Marland Kitchen zolpidem (AMBIEN) 10 MG tablet Take 10 mg by mouth at bedtime.      No current facility-administered medications on file prior to visit.     Review of Systems Review of Systems  Constitutional: Negative for fever,  appetite change, fatigue and unexpected weight change.  Eyes: Negative for pain and visual disturbance.  Respiratory: Negative for wheeze and shortness of breath.  pos for cough that is almost resolved  Cardiovascular: Negative for cp or palpitations    Gastrointestinal: Negative for nausea, diarrhea and constipation.  Genitourinary: Negative for urgency and frequency. neg for vaginal bleeding today  Skin: Negative for pallor or rash   MSK pos for chronic back and knee pain  Neurological: Negative for weakness, light-headedness, numbness and headaches.  Hematological: Negative for adenopathy. Does not bruise/bleed easily.  Psychiatric/Behavioral: Negative for dysphoric mood. The patient is not nervous/anxious.  pos for hx of bipolar that is currently stable        Objective:   Physical Exam  Constitutional: She appears well-developed and well-nourished. No distress.  Morbidly obese and well appearing   HENT:  Head: Normocephalic and atraumatic.  Right Ear: External ear normal.  Left Ear: External ear normal.  Mouth/Throat: Oropharynx is clear and moist.  Eyes: Conjunctivae and EOM are normal. Pupils are equal, round, and reactive to light. No scleral icterus.  Neck: Normal  range of motion. Neck supple. No JVD present. Carotid bruit is not present. No thyromegaly present.  Cardiovascular: Normal rate, regular rhythm, normal heart sounds and intact distal pulses.  Exam reveals no gallop.   Pulmonary/Chest: [place] normal and breath sounds normal. No respiratory distress. She has no wheezes. She exhibits no tenderness.  Abdominal: Soft. Bowel sounds are normal. She exhibits no distension, no abdominal bruit and no mass. There is no tenderness.  Genitourinary: No breast swelling, tenderness, discharge or bleeding.  Genitourinary Comments: Breast exam: No mass, nodules, thickening, tenderness, bulging, retraction, inflamation, nipple discharge or skin changes noted.  No axillary or clavicular  LA.     Will see gyn tomorrow for exam  Musculoskeletal: She exhibits no edema or tenderness.  Limited rom of LS and knees  Lymphadenopathy:    She has no cervical adenopathy.  Neurological: She is alert. She has normal reflexes. No cranial nerve deficit. She exhibits normal muscle tone. Coordination normal.  Skin: Skin is warm and dry. No rash noted. No erythema. No pallor.  Solar lentigines diffusely  Some sks   Psychiatric: She has a normal mood and affect.  Pleasant and talkative today-not depressed           Assessment & Plan:   Problem List Items Addressed This Visit      Endocrine   Hypothyroidism - Primary    Hypothyroidism  Pt has no clinical changes No change in energy level/ hair or skin/ edema and no tremor Lab Results  Component Value Date   TSH 2.12 12/28/2016           Relevant Medications   levothyroxine (SYNTHROID, LEVOTHROID) 50 MCG tablet     Other   Bipolar disorder (HCC)    Sees Dr Arlyce Dice with infrequent ups and downs seasonally No sig change        HYPERCHOLESTEROLEMIA, PURE    Still well controlled with lovastatin and diet  Disc goals for lipids and reasons to control them Rev labs with pt Rev low sat fat diet in detail       Relevant Medications   lovastatin (MEVACOR) 40 MG tablet   Morbid obesity ([place])    Discussed how this problem influences overall health and the risks it imposes  Reviewed plan for weight loss with lower calorie diet (via better food choices and also portion control or program like weight watchers) and exercise building up to or more than 30 minutes 5 days per week including some aerobic activity   She is able to exercise more now       Need for hepatitis C screening test   Post-menopausal bleeding    Pt has gyn appt in am No longer bleeding       Routine general medical examination at a health care facility    Reviewed health habits including diet and exercise and skin cancer prevention Reviewed  appropriate screening tests for age  Also reviewed health mt list, fam hx and immunization status , as well as social and family history   See HPI Strongly enc wt loss- she is getting back to healthier diet and exercise  utd screening  utd imms Declines hiv screen since low risk  Bipolar reviewed- cc labs to Dr Lajoyce Corners  Gyn appt in am for hx of post menop bleeding

## 2017-01-02 NOTE — Assessment & Plan Note (Signed)
Hypothyroidism  Pt has no clinical changes No change in energy level/ hair or skin/ edema and no tremor Lab Results  Component Value Date   TSH 2.12 12/28/2016

## 2017-01-02 NOTE — Assessment & Plan Note (Signed)
Pt has gyn appt in am No longer bleeding

## 2017-01-02 NOTE — Assessment & Plan Note (Signed)
Discussed how this problem influences overall health and the risks it imposes  Reviewed plan for weight loss with lower calorie diet (via better food choices and also portion control or program like weight watchers) and exercise building up to or more than 30 minutes 5 days per week including some aerobic activity   She is able to exercise more now

## 2017-01-02 NOTE — Assessment & Plan Note (Signed)
Sees Dr Arlyce Dice with infrequent ups and downs seasonally No sig change

## 2017-01-02 NOTE — Assessment & Plan Note (Signed)
Reviewed health habits including diet and exercise and skin cancer prevention Reviewed appropriate screening tests for age  Also reviewed health mt list, fam hx and immunization status , as well as social and family history   See HPI Strongly enc wt loss- she is getting back to healthier diet and exercise  utd screening  utd imms Declines hiv screen since low risk  Bipolar reviewed- cc labs to Dr Lajoyce Corners  Gyn appt in am for hx of post menop bleeding

## 2017-01-02 NOTE — Assessment & Plan Note (Signed)
Still well controlled with lovastatin and diet  Disc goals for lipids and reasons to control them Rev labs with pt Rev low sat fat diet in detail

## 2017-01-02 NOTE — Patient Instructions (Signed)
Work on [employer] loss  Keep exercising  Labs are stable  No change in medicines  Take care of yourself  Stop at check out for medicare interview appointment

## 2017-01-02 NOTE — Progress Notes (Signed)
Pre visit review using our clinic review tool, if applicable. No additional management support is needed unless otherwise documented below in the visit note. 

## 2017-01-03 ENCOUNTER — Encounter: Payer: Self-pay | Admitting: Obstetrics and Gynecology

## 2017-01-03 ENCOUNTER — Ambulatory Visit (INDEPENDENT_AMBULATORY_CARE_PROVIDER_SITE_OTHER): Payer: Medicare HMO | Admitting: Obstetrics and Gynecology

## 2017-01-03 ENCOUNTER — Other Ambulatory Visit (HOSPITAL_COMMUNITY)
Admission: RE | Admit: 2017-01-03 | Discharge: 2017-01-03 | Disposition: A | Discharge status: Home / Self Care | Payer: Medicare HMO | Source: Ambulatory Visit | Attending: Obstetrics and Gynecology | Admitting: Obstetrics and Gynecology

## 2017-01-03 ENCOUNTER — Encounter: Payer: Self-pay | Admitting: *Deleted

## 2017-01-03 VITALS — BP 137/80 | HR 66 | Resp 20 | Ht 61.5 in | Wt 217.0 lb

## 2017-01-03 DIAGNOSIS — Z1151 Encounter for screening for human papillomavirus (HPV): Secondary | ICD-10-CM | POA: Diagnosis not present

## 2017-01-03 DIAGNOSIS — N882 Stricture and stenosis of cervix uteri: Secondary | ICD-10-CM | POA: Diagnosis not present

## 2017-01-03 DIAGNOSIS — N840 Polyp of corpus uteri: Secondary | ICD-10-CM | POA: Diagnosis not present

## 2017-01-03 DIAGNOSIS — Z01411 Encounter for gynecological examination (general) (routine) with abnormal findings: Secondary | ICD-10-CM | POA: Insufficient documentation

## 2017-01-03 DIAGNOSIS — N950 Postmenopausal bleeding: Secondary | ICD-10-CM | POA: Diagnosis not present

## 2017-01-03 NOTE — Procedures (Signed)
Endometrial Biopsy Procedure Note  Pre-operative Diagnosis: PMB.  Post-operative Diagnosis: PMB. Cervical stenosis  Procedure Details  Urine pregnancy test was not done.  The risks (including infection, bleeding, pain, and uterine perforation) and benefits of the procedure were explained to the patient and Written informed consent was obtained.  The patient was placed in the dorsal lithotomy position.  Bimanual exam showed the uterus to be in the anteroflexed position.  A Graves' speculum inserted in the vagina, and the cervix was visualized and a pap smear performed but cervix was stenotic. Tenaculum applied to anterior lip and still unsuccessful in inserting the brush. Tenaculum re applied to posterior lip and still unsuccessful.  Cervix and vagina swabbed with betadine and os finders used to open and dilate the cervix; cytobrush was then inserted normally. The cervix was then prepped with betadine again.  A pipelle was inserted into the uterine cavity and sounded the uterus to a depth of 8cm.  A minimal to small amount of tissue was collected after 3 passes. The sample was sent for pathologic examination.  Condition: Stable  Complications: None  Plan: She tolerated the procedure.  The patient was advised to call for any fever or for prolonged or severe pain or bleeding. She was advised to use OTC analgesics as needed for mild to moderate pain. She was advised to avoid vaginal intercourse for 48 hours or until the bleeding has completely stopped.  Durene Romans MD Attending Center for [employer] farm [employer])

## 2017-01-03 NOTE — Progress Notes (Signed)
Obstetrics and Gynecology Visit Consultation Visit   Appointment Date: 01/03/2017  OBGYN Clinic: [facility]  Primary Care Provider: Loura Pardon  Referring Provider: Abner Greenspan, MD  Chief Complaint:  Chief Complaint  Patient presents with  . Postmenopausal bleeding    History of Present Illness: [name] [name] is a 61 y.o. Caucasian G1P1001 (LMP: approximately 2006), seen for the above chief complaint. Her past medical history is significant for BMI 41, h/o anal fissure, h/o c-section  She is seen in consultation from Dr. Loura Pardon, MD.   LMP in 2006 and no h/o HRT ever. Patient had some non painful dark spotting about 1-2 years ago for a few days and it resolved by itself.  Approximately 2 weeks ago she had a few days of dark spotting, this time some what crampy, and it resolved after a few days. She saw Dr. Glori Bickers and she recommend GYN evaluation.    No chest pain, SOB, abdominal pain, current VB or spotting, vaginal itching, dysuria, hematuria, blood in BMs or change in BMs.   Review of Systems: as noted in the History of Present Illness.  Past Medical History:  Past Medical History:  Diagnosis Date  . Allergic rhinitis   . Bipolar 1 disorder ([place])   . Fissure, anal   . GERD (gastroesophageal reflux disease)   . History of basal cell carcinoma excision    ON FACE  . History of squamous cell carcinoma excision    ON RIGHT LEG  . Hyperlipidemia   . Hypothyroidism   . IBS (irritable bowel syndrome)   . Liver cyst   . Mallet finger of left hand    small finger    Past Surgical History:  Past Surgical History:  Procedure Laterality Date  . CARDIAC CATHETERIZATION  04/1999   per pt normal  . CARDIOVASCULAR STRESS TEST  02-23-2011  dr Chrissie Noa [facility] clinic)   normal nuclear study/  no ischemia/  ef 64%  . CATARACT EXTRACTION W/ INTRAOCULAR LENS  IMPLANT, BILATERAL  2003   Bil  . CESAREAN SECTION  1984  . CLOSED REDUCTION  FINGER WITH PERCUTANEOUS PINNING Left 04/22/2014   Procedure: LEFT SMALL FINGER CLOSED REDUCTION PINNING;  Surgeon: Linna Hoff, MD;  Location: [facility];  Service: Orthopedics;  Laterality: Left;  . COLONOSCOPY  11/2002  . LAPAROSCOPIC CHOLECYSTECTOMY  03-18-2000  . TRANSTHORACIC ECHOCARDIOGRAM  02-23-2011   normal lvf/  ef 58%/  mild rve/   mild lae/  mild  mr  & tr/  mild pulmonary htn  . WISDOM TOOTH EXTRACTION      Past Obstetrical History:  OB History  Gravida Para Term Preterm AB Living  1 1 1     1   SAB TAB Ectopic Multiple Live Births          1    # Outcome Date GA Lbr Len/2nd Weight Sex Delivery Anes PTL Lv  1 Term 09/20/83 [redacted]w[redacted]d   F CS-LTranv   LIV     Complications: Fetal Intolerance      Past Gynecological History: As per HPI. No. history of abnormal pap smears (last pap smear: 2015, which was NILM and HPV negative) Past h/o OCP use prior to menopause Yes.   HRT use.   Social History:  Social History   Social History  . Marital status: Married    Spouse name: N/A  . Number of children: 1  . Years of education: N/A   Occupational  History  . Home Retired   Social History Main Topics  . Smoking status: Never Smoker  . Smokeless tobacco: Never Used  . Alcohol use No  . Drug use: No  . Sexual activity: Not Currently    Birth control/ protection: Post-menopausal   Other Topics Concern  . Not on file   Social History Narrative   Moved here from [place]    Family History:  Family History  Problem Relation Age of Onset  . Stroke Mother   . Heart disease Mother   . Hypertension Father   . Diabetes Father   . Hyperlipidemia Father   . Diabetes Brother    She denies any female cancers  Health Maintenance:  Mammogram Yes.  , which was done in 2018 and negative  Colonoscopy Yes.   2016 and negative (10 year repeat)  Medications Ms. Moralis had no medications administered during this visit. Current Outpatient Prescriptions   Medication Sig Dispense Refill  . ARIPiprazole (ABILIFY) 30 MG tablet Take 30 mg by mouth daily.      Marland Kitchen aspirin 81 MG tablet Take 81 mg by mouth daily.      . benzonatate (TESSALON) 200 MG capsule Take 1 capsule (200 mg total) by mouth 3 (three) times daily as needed for cough. Swallow whole, do not bite pill 30 capsule 1  . buPROPion (WELLBUTRIN XL) 150 MG 24 hr tablet Take two tablets by mouth every morning     . cetirizine (ZYRTEC) 10 MG tablet Take 10 mg by mouth daily.      . Cholecalciferol (VITAMIN D) 2000 UNITS CAPS Take 1 capsule by mouth daily.      . clonazePAM (KLONOPIN) 1 MG tablet Take 1 mg by mouth at bedtime as needed.      . diclofenac (VOLTAREN) 75 MG EC tablet Take 75 mg by mouth 2 (two) times daily as needed.  3  . famotidine (PEPCID) 20 MG tablet Take 20 mg by mouth daily.    Marland Kitchen FLUoxetine (PROZAC) 20 MG capsule Take 20 mg by mouth daily.    Marland Kitchen HYDROcodone-homatropine (HYCODAN) 5-1.5 MG/5ML syrup Take 5 mLs by mouth every 6 (six) hours as needed for cough. Caution may sedate 120 mL 0  . lansoprazole (PREVACID) 30 MG capsule Take 1 capsule by mouth  daily 90 capsule 3  . levothyroxine (SYNTHROID, LEVOTHROID) 50 MCG tablet Take 1 tablet by mouth  daily 90 tablet 3  . lovastatin (MEVACOR) 40 MG tablet Take 1 tablet by mouth  daily 90 tablet 3  . Multiple Vitamin (MULTIVITAMIN) tablet Take 1 tablet by mouth daily.      . naproxen (NAPROSYN) 500 MG tablet Take 1 tablet (500 mg total) by mouth 2 (two) times daily with a meal. 20 tablet 0  . OLANZapine (ZYPREXA) 2.5 MG tablet Take 2.5 mg by mouth at bedtime.    Marland Kitchen zolpidem (AMBIEN) 10 MG tablet Take 10 mg by mouth at bedtime.     . fluticasone (FLONASE) 50 MCG/ACT nasal spray Place 2 sprays into both nostrils daily. (Patient not taking: Reported on 01/03/2017) 48 g 3   No current facility-administered medications for this visit.     Allergies Xylocaine [lidocaine hcl]; Codeine; Lamictal [lamotrigine]; Lithium; and Tegretol  [carbamazepine]   Physical Exam:  BP 137/80 (BP Location: Right Arm, Patient Position: Sitting, Cuff Size: Large)   Pulse 66   Resp 20   Ht 5' 1.5" (1.562 m)   Wt 217 lb (98.4 kg)   BMI 40.34  kg/m  Body mass index is 40.34 kg/m. General appearance: Well nourished, well developed female in no acute distress.  Cardiovascular: normal s1 and s2.  No murmurs, rubs or gallops. Respiratory:  Clear to auscultation bilateral. Normal respiratory effort Abdomen: positive bowel sounds and no masses, hernias; diffusely non tender to palpation, non distended Neuro/Psych:  Normal mood and affect.  Skin:  Warm and dry.  Lymphatic:  No inguinal lymphadenopathy.   Pelvic exam: is limited by body habitus EGBUS: within normal limits, Vagina: within normal limits and with no blood or discharge in the vault, Cervix: normal appearing cervix without tenderness, discharge or lesions. Uterus:  nonenlarged and non tender and Adnexa:  normal adnexa and no mass, fullness, tenderness Rectovaginal: deferred  See procedure note for pap smear and embx details.   Laboratory: none  Radiology: none  Assessment: patient stable. H/o PMB  Plan: D/w pt that will f/u pap and embx results and normal to have some bleeding for a week or two after the procedure today. If all negative and no more s/s, then recommend expectant management. If all negative and s/s recur, I told her to call us for a pelvic u/s and GYN visit.  If something is + then will dispo accordingly.  All questions were asked and answered.   RTC based on biopsy results.   Durene Romans MD Attending Center for [employer] farm [employer])

## 2017-01-09 LAB — CYTOLOGY - PAP
DIAGNOSIS: NEGATIVE
HPV (WINDOPATH): NOT DETECTED

## 2017-01-11 ENCOUNTER — Telehealth: Payer: Self-pay | Admitting: Obstetrics and Gynecology

## 2017-01-11 NOTE — Telephone Encounter (Signed)
GYN Telephone Note Patient called @ [phone] and generic VM picked up and VM left stating that everything came back fine and to call the office to let us know that she received the message and to talk about next steps.  If no response from patient, will try again next week.  If patient calls the office, please inform the patient that her results were negative for cancer but there is a polyp in the uterus, which likely caused the spotting. If her bleeding or spotting comes back, please call the office b/c I'd recommend an outpatient hysteroscopy, D&C procedure to remove it.   Durene Romans MD Attending Center for [employer] (Faculty Practice) 01/11/2017 Time: [phone]

## 2017-01-14 DIAGNOSIS — M1712 Unilateral primary osteoarthritis, left knee: Secondary | ICD-10-CM | POA: Diagnosis not present

## 2017-01-14 DIAGNOSIS — M7632 Iliotibial band syndrome, left leg: Secondary | ICD-10-CM | POA: Diagnosis not present

## 2017-01-14 DIAGNOSIS — M7052 Other bursitis of knee, left knee: Secondary | ICD-10-CM | POA: Diagnosis not present

## 2017-01-15 ENCOUNTER — Telehealth: Payer: Self-pay | Admitting: *Deleted

## 2017-01-15 NOTE — Telephone Encounter (Signed)
Informed pt of results and recommendations, will call back if she experiences any bleeding or spotting to schedule hysteroscopy and D&C for uterine polyp.

## 2017-01-15 NOTE — Telephone Encounter (Signed)
-----   Message from Blanchie Dessert, [place] sent at 01/14/2017  2:58 PM EST ----- Regarding: call back regarding next plan of care Contact: [phone] Pt called stating that Dr Ilda Basset told her to call the office to "find out what the next process is "  After receiving labs results..  thanks

## 2017-01-17 ENCOUNTER — Telehealth: Payer: Self-pay | Admitting: Obstetrics and Gynecology

## 2017-01-17 NOTE — Telephone Encounter (Signed)
GYN Telephone Note Patient called @ [phone], to see if she has any questions regarding follow up after d/w RN last week; VM left and I told her to call our offices if she has anymore bleeding or spotting.  Durene Romans MD Attending Center for [employer] (Faculty Practice) 01/17/2017 Time: [phone]

## 2017-01-30 DIAGNOSIS — M7052 Other bursitis of knee, left knee: Secondary | ICD-10-CM | POA: Diagnosis not present

## 2017-01-30 DIAGNOSIS — M7632 Iliotibial band syndrome, left leg: Secondary | ICD-10-CM | POA: Diagnosis not present

## 2017-01-30 DIAGNOSIS — M1712 Unilateral primary osteoarthritis, left knee: Secondary | ICD-10-CM | POA: Diagnosis not present

## 2017-02-06 ENCOUNTER — Encounter: Payer: Self-pay | Admitting: Primary Care

## 2017-02-06 ENCOUNTER — Ambulatory Visit (INDEPENDENT_AMBULATORY_CARE_PROVIDER_SITE_OTHER): Payer: Medicare HMO | Admitting: Primary Care

## 2017-02-06 VITALS — BP 118/78 | HR 78 | Temp 98.1°F | Ht 61.0 in | Wt 216.0 lb

## 2017-02-06 DIAGNOSIS — M62838 Other muscle spasm: Secondary | ICD-10-CM | POA: Diagnosis not present

## 2017-02-06 MED ORDER — METHOCARBAMOL 500 MG PO TABS: 500.0000 mg | ORAL_TABLET | Freq: Three times a day (TID) | ORAL | 0 refills | Status: DC | PRN

## 2017-02-06 NOTE — Progress Notes (Signed)
   Subjective:    Patient ID: [name] [name], female    DOB: 1956/05/18, 61 y.o.   MRN: [id number]  HPI  [name] [name] is a 61 year old female with a history of chronic back pain, arthritis who presents today with a chief complaint of back pain. She was taking the laundry of the dryer on Monday. She then experienced pain from her right upper thoracic back through her right lower back. She's been using Aleve, methocarbamol and heating pad with improvement. She has pain with flexion to her lower back, but overall better. She denies radiation of pain, weakness, numbness/tingling.  Review of Systems  Musculoskeletal: Positive for back pain and myalgias.  Neurological: Negative for weakness and numbness.       Objective:   Physical Exam  Constitutional: She appears well-nourished.  Neck: Neck supple.  Cardiovascular: Normal rate and regular rhythm.   Pulmonary/Chest: Effort normal and breath sounds normal.  Musculoskeletal:       Lumbar back: She exhibits decreased range of motion. She exhibits no tenderness.       Back:  Muscle tenderness and tension. Negative straight leg raise.  Skin: Skin is warm and dry.          Assessment & Plan:  Muscle Strain:  Present to right upper through lower back x 2 days. Moving laundry from dryer, bending forward. Exam today with MSK tenderness with tension as noted above. Suspect muscle strain and will treat with conservative measures. Rx for methocarbamol sent to pharmacy. Discussed stretching, refrain from sitting still, continue heating pad. Follow up PRN.  Sheral Flow, NP

## 2017-02-06 NOTE — Patient Instructions (Signed)
You may take the methocarbamol muscle relaxer every 8 hours as needed for pain/muscle spasm.  Continue to use your heating pad. Refrain from sitting still too long as this will increase stiffness/tightness.  It was a pleasure to see you today!

## 2017-02-06 NOTE — Progress Notes (Signed)
Pre visit review using our clinic review tool, if applicable. No additional management support is needed unless otherwise documented below in the visit note. 

## 2017-02-12 DIAGNOSIS — M25562 Pain in left knee: Secondary | ICD-10-CM | POA: Diagnosis not present

## 2017-02-14 DIAGNOSIS — M25562 Pain in left knee: Secondary | ICD-10-CM | POA: Diagnosis not present

## 2017-02-19 DIAGNOSIS — M25562 Pain in left knee: Secondary | ICD-10-CM | POA: Diagnosis not present

## 2017-02-20 ENCOUNTER — Ambulatory Visit (INDEPENDENT_AMBULATORY_CARE_PROVIDER_SITE_OTHER): Payer: Medicare HMO

## 2017-02-20 VITALS — BP 120/80 | HR 69 | Temp 97.9°F | Ht 61.0 in | Wt 215.5 lb

## 2017-02-20 DIAGNOSIS — Z0000 Encounter for general adult medical examination without abnormal findings: Secondary | ICD-10-CM

## 2017-02-20 NOTE — Progress Notes (Signed)
Pre visit review using our clinic review tool, if applicable. No additional management support is needed unless otherwise documented below in the visit note. 

## 2017-02-20 NOTE — Patient Instructions (Signed)
Ms. Lange , Thank you for taking time to come for your Medicare Wellness Visit. I appreciate your ongoing commitment to your health goals. Please review the following plan we discussed and let me know if I can assist you in the future.   These are the goals we discussed: Goals    . Increase physical activity          Starting 02/20/17, I will continue to walk at least 1 mile daily.        This is a list of the screening recommended for you and due dates:  Health Maintenance  Topic Date Due  . Stool Blood Test  02/20/2018*  . HIV Screening  01/12/2024*  . Mammogram  12/25/2017  . Tetanus Vaccine  10/01/2019  . Pap Smear  01/04/2020  . Flu Shot  Addressed  .  Hepatitis C: One time screening is recommended by Center for Disease Control  (CDC) for  adults born from [birth year] through 1963.   Completed  *Topic was postponed. The date shown is not the original due date.   Preventive Care for Adults  A healthy lifestyle and preventive care can promote health and wellness. Preventive health guidelines for adults include the following key practices.  . A routine yearly physical is a good way to check with your health care provider about your health and preventive screening. It is a chance to share any concerns and updates on your health and to receive a thorough exam.  . Visit your dentist for a routine exam and preventive care every 6 months. Brush your teeth twice a day and floss once a day. Good oral hygiene prevents tooth decay and gum disease.  . The frequency of eye exams is based on your age, health, family medical history, use  of contact lenses, and other factors. Follow your health care provider's ecommendations for frequency of eye exams.  . Eat a healthy diet. Foods like vegetables, fruits, whole grains, low-fat dairy products, and lean protein foods contain the nutrients you need without too many calories. Decrease your intake of foods high in solid fats, added sugars, and salt.  Eat the right amount of calories for you. Get information about a proper diet from your health care provider, if necessary.  . Regular physical exercise is one of the most important things you can do for your health. Most adults should get at least 150 minutes of moderate-intensity exercise (any activity that increases your heart rate and causes you to sweat) each week. In addition, most adults need muscle-strengthening exercises on 2 or more days a week.  Silver Sneakers may be a benefit available to you. To determine eligibility, you may visit the website: www.silversneakers.com or contact program at [phone] Mon-Fri between 8AM-8PM.   . Maintain a healthy weight. The body mass index (BMI) is a screening tool to identify possible weight problems. It provides an estimate of body fat based on height and weight. Your health care provider can find your BMI and can help you achieve or maintain a healthy weight.   For adults 20 years and older: ? A BMI below 18.5 is considered underweight. ? A BMI of 18.5 to 24.9 is normal. ? A BMI of 25 to 29.9 is considered overweight. ? A BMI of 30 and above is considered obese.   . Maintain normal blood lipids and cholesterol levels by exercising and minimizing your intake of saturated fat. Eat a balanced diet with plenty of fruit and vegetables. Blood tests for lipids  and cholesterol should begin at age 55 and be repeated every 5 years. If your lipid or cholesterol levels are high, you are over 50, or you are at high risk for heart disease, you may need your cholesterol levels checked more frequently. Ongoing high lipid and cholesterol levels should be treated with medicines if diet and exercise are not working.  . If you smoke, find out from your health care provider how to quit. If you do not use tobacco, please do not start.  . If you choose to drink alcohol, please do not consume more than 2 drinks per day. One drink is considered to be 12 ounces (355  mL) of beer, 5 ounces (148 mL) of wine, or 1.5 ounces (44 mL) of liquor.  . If you are 37-75 years old, ask your health care provider if you should take aspirin to prevent strokes.  . Use sunscreen. Apply sunscreen liberally and repeatedly throughout the day. You should seek shade when your shadow is shorter than you. Protect yourself by wearing long sleeves, pants, a wide-brimmed hat, and sunglasses year round, whenever you are outdoors.  . Once a month, do a whole body skin exam, using a mirror to look at the skin on your back. Tell your health care provider of new moles, moles that have irregular borders, moles that are larger than a pencil eraser, or moles that have changed in shape or color.

## 2017-02-20 NOTE — Progress Notes (Signed)
PCP notes:  Healthy behaviors:  Exercise: currently walks 1 mile daily; physical therapy twice weekly for 60 min each session Diet: reduces intake of fried foods and dairy products; drinks 64 oz water daily Sleep: 9 hours per night (uninterrupted)  Health maintenance:  No gaps identified.  Abnormal screenings:   Depression score: 5 Mini-Cog score: 19/20  Patient concerns:   None  Nurse concerns:  None  Next PCP appt:   N/A; CPE in Jan 2018  I reviewed health advisor's note, was available for consultation, and agree with documentation and plan. Loura Pardon MD

## 2017-02-20 NOTE — Progress Notes (Signed)
Subjective:   [name] [name] is a 61 y.o. female who presents for Medicare Annual (Subsequent) preventive examination.  Review of Systems:  N/A Cardiac Risk Factors include: advanced age (>37men, >18 women);obesity (BMI >30kg/m2);dyslipidemia     Objective:     Vitals: BP 120/80 (BP Location: Right Arm, Patient Position: Sitting, Cuff Size: Large)   Pulse 69   Temp 97.9 F (36.6 C) (Oral)   Ht 5\' 1"  (1.549 m)   Wt 215 lb 8 oz (97.8 kg)   SpO2 97%   BMI 40.72 kg/m   Body mass index is 40.72 kg/m.   Tobacco History  Smoking Status  . Never Smoker  Smokeless Tobacco  . Never Used     Counseling given: No   Past Medical History:  Diagnosis Date  . Allergic rhinitis   . Bipolar 1 disorder ([place])   . Fissure, anal   . GERD (gastroesophageal reflux disease)   . History of basal cell carcinoma excision    ON FACE  . History of squamous cell carcinoma excision    ON RIGHT LEG  . Hyperlipidemia   . Hypothyroidism   . IBS (irritable bowel syndrome)   . Liver cyst   . Mallet finger of left hand    small finger   Past Surgical History:  Procedure Laterality Date  . CARDIAC CATHETERIZATION  04/1999   per pt normal  . CARDIOVASCULAR STRESS TEST  02-23-2011  dr Chrissie Noa [facility])   normal nuclear study/  no ischemia/  ef 64%  . CATARACT EXTRACTION W/ INTRAOCULAR LENS  IMPLANT, BILATERAL  2003   [place]il  . CESAREAN SECTION  1984  . CLOSED REDUCTION FINGER WITH PERCUTANEOUS PINNING Left 04/22/2014   Procedure: LEFT SMALL FINGER CLOSED REDUCTION PINNING;  Surgeon: Linna Hoff, MD;  Location: [place];  Service: Orthopedics;  Laterality: Left;  . COLONOSCOPY  11/2002  . LAPAROSCOPIC CHOLECYSTECTOMY  03-18-2000  . TRANSTHORACIC ECHOCARDIOGRAM  02-23-2011   normal lvf/  ef 58%/  mild rve/   mild lae/  mild  mr  & tr/  mild pulmonary htn  . WISDOM TOOTH EXTRACTION     Family History  Problem Relation Age of Onset  . Stroke Mother   .  Heart disease Mother   . Hypertension Father   . Diabetes Father   . Hyperlipidemia Father   . Diabetes Brother    History  Sexual Activity  . Sexual activity: Not Currently  . Birth control/ protection: Post-menopausal    Outpatient Encounter Prescriptions as of 02/20/2017  Medication Sig  . ARIPiprazole (ABILIFY) 30 MG tablet Take 30 mg by mouth daily.    Marland Kitchen aspirin 81 MG tablet Take 81 mg by mouth daily.    Marland Kitchen buPROPion (WELLBUTRIN XL) 150 MG 24 hr tablet Take two tablets by mouth every morning   . cetirizine (ZYRTEC) 10 MG tablet Take 10 mg by mouth daily.    . Cholecalciferol (VITAMIN D) 2000 UNITS CAPS Take 1 capsule by mouth daily.    . clonazePAM (KLONOPIN) 1 MG tablet Take 1 mg by mouth at bedtime as needed.    . diclofenac (VOLTAREN) 75 MG EC tablet Take 75 mg by mouth 2 (two) times daily as needed.  . famotidine (PEPCID) 20 MG tablet Take 20 mg by mouth daily.  Marland Kitchen FLUoxetine (PROZAC) 20 MG capsule Take 20 mg by mouth daily.  . fluticasone (FLONASE) 50 MCG/ACT nasal spray Place 2 sprays into both nostrils daily.  Marland Kitchen  lansoprazole (PREVACID) 30 MG capsule Take 1 capsule by mouth  daily  . levothyroxine (SYNTHROID, LEVOTHROID) 50 MCG tablet Take 1 tablet by mouth  daily  . lovastatin (MEVACOR) 40 MG tablet Take 1 tablet by mouth  daily  . methocarbamol (ROBAXIN) 500 MG tablet Take 1 tablet (500 mg total) by mouth every 8 (eight) hours as needed for muscle spasms.  . Multiple Vitamin (MULTIVITAMIN) tablet Take 1 tablet by mouth daily.    . naproxen (NAPROSYN) 500 MG tablet Take 1 tablet (500 mg total) by mouth 2 (two) times daily with a meal.  . OLANZapine (ZYPREXA) 2.5 MG tablet Take 2.5 mg by mouth at bedtime.  Marland Kitchen zolpidem (AMBIEN) 10 MG tablet Take 10 mg by mouth at bedtime.    No facility-administered encounter medications on file as of 02/20/2017.     Activities of Daily Living In your present state of health, do you have any difficulty performing the following activities:  02/20/2017  Hearing? Y  Vision? N  Difficulty concentrating or making decisions? Y  Walking or climbing stairs? N  Dressing or bathing? N  Doing errands, shopping? N  Preparing Food and eating ? N  Using the Toilet? N  In the past six months, have you accidently leaked urine? N  Do you have problems with loss of bowel control? N  Managing your Medications? N  Managing your Finances? N  Housekeeping or managing your Housekeeping? N  Some recent data might be hidden    Patient Care Team: Abner Greenspan, MD as PCP - General Eula Flax, OD as Consulting Physician (Optometry)    Assessment:    Hearing Screening Comments: Wears hearing aids Vision Screening Comments: Last vision exam in Oct 2017 with Dr. Eula Flax, OD  Exercise Activities and Dietary recommendations Current Exercise Habits: Home exercise routine;Structured exercise class, Type of exercise: walking;Other - see comments (physical therapy 2 days per week 60 min), Time (Minutes): 25, Frequency (Times/Week): 7, Weekly Exercise (Minutes/Week): 175, Intensity: Moderate, Exercise limited by: None identified  Goals    . Increase physical activity          Starting 02/20/17, I will continue to walk at least 1 mile daily.       Fall Risk Fall Risk  02/20/2017 10/25/2015 10/22/2014 10/16/2013  Falls in the past year? No No Yes Yes  Number falls in past yr: - - 1 1  Injury with Fall? - - Yes No   Depression Screen PHQ 2/9 Scores 02/20/2017 10/25/2015 10/22/2014 10/16/2013  PHQ - 2 Score 2 0 0 0  PHQ- 9 Score 5 - - -     Cognitive Function MMSE - Mini Mental State Exam 02/20/2017  Orientation to time 5  Orientation to Place 5  Registration 3  Attention/ Calculation 0  Recall 2  Recall-comments pt was unable to recall 1 of 3 words  Language- name 2 objects 0  Language- repeat 1  Language- follow 3 step command 3  Language- read & follow direction 0  Write a sentence 0  Copy design 0  Total score 19     PLEASE  NOTE: A Mini-Cog screen was completed. Maximum score is 20. A value of 0 denotes this part of [place] MMSE was not completed or the patient failed this part of the Mini-Cog screening.   Mini-Cog Screening Orientation to Time - Max 5 pts Orientation to Place - Max 5 pts Registration - Max 3 pts Recall - Max 3 pts Language Repeat -  Max 1 pts Language Follow 3 Step Command - Max 3 pts     Immunization History  Administered Date(s) Administered  . Influenza Split 09/16/2012  . Influenza Whole 10/01/2006, 09/14/2008, 09/07/2009, 09/04/2010, 08/21/2011  . Influenza, Seasonal, Injecte, Preservative Fre 09/13/2016  . Influenza-Unspecified 09/08/2013, 08/10/2014, 08/25/2015  . Pneumococcal Polysaccharide-23 08/15/2006, 10/16/2013  . Td 12/10/1998, 09/30/2009  . Zoster 04/13/2011   Screening Tests Health Maintenance  Topic Date Due  . COLON CANCER SCREENING ANNUAL FOBT  02/20/2018 (Originally 10/20/2014)  . HIV Screening  01/12/2024 (Originally 03/09/1973)  . MAMMOGRAM  12/25/2017  . TETANUS/TDAP  10/01/2019  . PAP SMEAR  01/04/2020  . INFLUENZA VACCINE  Addressed  . Hepatitis C Screening  Completed      Plan:    I have personally reviewed and addressed the Medicare Annual Wellness questionnaire and have noted the following in the patient's chart:  A. Medical and social history B. Use of alcohol, tobacco or illicit drugs  C. Current medications and supplements D. Functional ability and status E.  Nutritional status F.  Physical activity G. Advance directives H. List of other physicians I.  Hospitalizations, surgeries, and ER visits in previous 12 months J.  [address] to include hearing, vision, cognitive, depression L. Referrals and appointments - none  In addition, I have reviewed and discussed with patient certain preventive protocols, quality metrics, and best practice recommendations. A written personalized care plan for preventive services as well as general  preventive health recommendations were provided to patient.  See attached scanned questionnaire for additional information.   Signed,   Lindell Noe, MHA, BS, LPN Health Coach

## 2017-02-21 DIAGNOSIS — M25562 Pain in left knee: Secondary | ICD-10-CM | POA: Diagnosis not present

## 2017-02-26 DIAGNOSIS — M25562 Pain in left knee: Secondary | ICD-10-CM | POA: Diagnosis not present

## 2017-02-28 DIAGNOSIS — M25562 Pain in left knee: Secondary | ICD-10-CM | POA: Diagnosis not present

## 2017-03-05 DIAGNOSIS — M25562 Pain in left knee: Secondary | ICD-10-CM | POA: Diagnosis not present

## 2017-03-12 DIAGNOSIS — M25562 Pain in left knee: Secondary | ICD-10-CM | POA: Diagnosis not present

## 2017-03-13 DIAGNOSIS — M7632 Iliotibial band syndrome, left leg: Secondary | ICD-10-CM | POA: Diagnosis not present

## 2017-03-13 DIAGNOSIS — M7052 Other bursitis of knee, left knee: Secondary | ICD-10-CM | POA: Diagnosis not present

## 2017-03-13 DIAGNOSIS — M1712 Unilateral primary osteoarthritis, left knee: Secondary | ICD-10-CM | POA: Diagnosis not present

## 2017-03-19 DIAGNOSIS — M25562 Pain in left knee: Secondary | ICD-10-CM | POA: Diagnosis not present

## 2017-03-21 DIAGNOSIS — M25562 Pain in left knee: Secondary | ICD-10-CM | POA: Diagnosis not present

## 2017-03-28 DIAGNOSIS — F314 Bipolar disorder, current episode depressed, severe, without psychotic features: Secondary | ICD-10-CM | POA: Diagnosis not present

## 2017-04-15 ENCOUNTER — Ambulatory Visit (INDEPENDENT_AMBULATORY_CARE_PROVIDER_SITE_OTHER): Payer: Medicare HMO | Admitting: Psychology

## 2017-04-15 DIAGNOSIS — F3132 Bipolar disorder, current episode depressed, moderate: Secondary | ICD-10-CM | POA: Diagnosis not present

## 2017-05-01 ENCOUNTER — Ambulatory Visit (INDEPENDENT_AMBULATORY_CARE_PROVIDER_SITE_OTHER): Payer: Medicare HMO | Admitting: Psychology

## 2017-05-01 DIAGNOSIS — F3132 Bipolar disorder, current episode depressed, moderate: Secondary | ICD-10-CM | POA: Diagnosis not present

## 2017-05-15 ENCOUNTER — Ambulatory Visit (INDEPENDENT_AMBULATORY_CARE_PROVIDER_SITE_OTHER): Payer: Medicare HMO | Admitting: Psychology

## 2017-05-15 DIAGNOSIS — F3132 Bipolar disorder, current episode depressed, moderate: Secondary | ICD-10-CM | POA: Diagnosis not present

## 2017-06-06 ENCOUNTER — Ambulatory Visit (INDEPENDENT_AMBULATORY_CARE_PROVIDER_SITE_OTHER): Payer: Medicare HMO | Admitting: Psychology

## 2017-06-06 DIAGNOSIS — F3132 Bipolar disorder, current episode depressed, moderate: Secondary | ICD-10-CM

## 2017-07-04 ENCOUNTER — Ambulatory Visit (INDEPENDENT_AMBULATORY_CARE_PROVIDER_SITE_OTHER): Payer: Medicare HMO | Admitting: Psychology

## 2017-07-04 DIAGNOSIS — F3132 Bipolar disorder, current episode depressed, moderate: Secondary | ICD-10-CM

## 2017-07-08 ENCOUNTER — Ambulatory Visit (INDEPENDENT_AMBULATORY_CARE_PROVIDER_SITE_OTHER): Payer: Medicare HMO | Admitting: Family Medicine

## 2017-07-08 ENCOUNTER — Encounter: Payer: Self-pay | Admitting: Family Medicine

## 2017-07-08 VITALS — BP 110/70 | HR 66 | Temp 97.5°F | Wt 218.4 lb

## 2017-07-08 DIAGNOSIS — M26609 Unspecified temporomandibular joint disorder, unspecified side: Secondary | ICD-10-CM | POA: Diagnosis not present

## 2017-07-08 DIAGNOSIS — N952 Postmenopausal atrophic vaginitis: Secondary | ICD-10-CM | POA: Diagnosis not present

## 2017-07-08 MED ORDER — ESTRADIOL 0.1 MG/GM VA CREA: TOPICAL_CREAM | VAGINAL | 3 refills | Status: DC

## 2017-07-08 NOTE — Assessment & Plan Note (Signed)
Referring pain to L ear  Tenderness of TM joint on exam  Recommend nite guard from dentist  Aleve prn with food  Cold or hot compress prn Avoid chewy foods/gum  Drink through a straw Update if not improving

## 2017-07-08 NOTE — Patient Instructions (Addendum)
Try the estrogen cream twice weekly for vaginal dryness If bleeding occurs stop use (or any other side effects)   For TMJ Avoid chewing excessively (soft foods are better) Use a straw for drinking No more gum for a while  See your dentist for a nite guard

## 2017-07-08 NOTE — Assessment & Plan Note (Addendum)
Pt is having pain with intercourse  Px estrogen vaginal cream small amt (1 cm of cream in applicator) twice weekly  Stop if any vag d/c or bleeding (noted in chart that she has an endometrial polyp that is b9 and being watched)

## 2017-07-08 NOTE — Progress Notes (Signed)
Subjective:    Patient ID: [name] [name], female    DOB: September 29, 1956, 61 y.o.   MRN: [id number]  HPI  Here for ear pain - started on wed and also to discuss vaginal dryness  ? May be TMJ Dull throb on L that goes down to her jaw  Hurts to put in hearing aides  Hurts to lie on it   Some pain to chew and yawn  [name] [name] tends to clench teeth at night (had a nite guard years ago)  Took some aleve  More stress lately -traveling with grandkids and lot of guests   No tooth sensitivity  Just went to the dentist 2 wk ago- good check up   Vaginal dryness-since menopause Lubricants do not work  Pap nl 1/18 with gyn  Painful intercourse  Interested in estrogen cream  Hx of b9 endometrial polyp -diagnosed with gyn in January    Review of Systems Review of Systems  Constitutional: Negative for fever, appetite change, fatigue and unexpected weight change.  ENT neg for congestion or sinus pain  Eyes: Negative for pain and visual disturbance.  Respiratory: Negative for cough and shortness of breath.   Cardiovascular: Negative for cp or palpitations    Gastrointestinal: Negative for nausea, diarrhea and constipation.  Genitourinary: Negative for urgency and frequency.  Skin: Negative for pallor or rash   Neurological: Negative for weakness, light-headedness, numbness and headaches.  Hematological: Negative for adenopathy. Does not bruise/bleed easily.  Psychiatric/Behavioral: Negative for dysphoric mood. The patient is not nervous/anxious.         Objective:   Physical Exam  Constitutional: [name] [name] appears well-developed and well-nourished. No distress.  obese and well appearing   HENT:  Head: Normocephalic and atraumatic.  Nose: Nose normal.  Mouth/Throat: Oropharynx is clear and moist. No oropharyngeal exudate.  TMs -small effusions bilaterally No sinus tenderness Nares are clear  Throat is clear  Marked tenderness of L TM joint with movement (also pain to open mouth wide) No  dislocation or crepitus   Eyes: Pupils are equal, round, and reactive to light. Conjunctivae and EOM are normal. Right eye exhibits no discharge. Left eye exhibits no discharge. No scleral icterus.  Neck: Normal range of motion. Neck supple.  Cardiovascular: Normal rate, regular rhythm and normal heart sounds.   Pulmonary/Chest: Effort normal and breath sounds normal. No respiratory distress. [name] [name] has no wheezes. [name] [name] has no rales.  Abdominal:  No suprapubic tenderness or fullness    Musculoskeletal: [name] [name] exhibits no edema.  Lymphadenopathy:    [name] [name] has no cervical adenopathy.  Neurological: No cranial nerve deficit.  Skin: Skin is warm and dry. No rash noted. No pallor.  Psychiatric: [name] [name] has a normal mood and affect.          Assessment & Plan:   Problem List Items Addressed This Visit      Musculoskeletal and Integument   TMJ (temporomandibular joint disorder)    Referring pain to L ear  Tenderness of TM joint on exam  Recommend nite guard from dentist  Aleve prn with food  Cold or hot compress prn Avoid chewy foods/gum  Drink through a straw Update if not improving         Genitourinary   Atrophic vaginitis    Pt is having pain with intercourse  Px estrogen vaginal cream small amt (1 cm of cream in applicator) twice weekly  Stop if any vag d/c or bleeding (noted in chart that [name] [name] has an endometrial polyp that  is b9 and being watched)

## 2017-07-16 DIAGNOSIS — Z85828 Personal history of other malignant neoplasm of skin: Secondary | ICD-10-CM | POA: Diagnosis not present

## 2017-07-16 DIAGNOSIS — L718 Other rosacea: Secondary | ICD-10-CM | POA: Diagnosis not present

## 2017-07-16 DIAGNOSIS — D229 Melanocytic nevi, unspecified: Secondary | ICD-10-CM | POA: Diagnosis not present

## 2017-07-16 DIAGNOSIS — Z1283 Encounter for screening for malignant neoplasm of skin: Secondary | ICD-10-CM | POA: Diagnosis not present

## 2017-07-16 DIAGNOSIS — L570 Actinic keratosis: Secondary | ICD-10-CM | POA: Diagnosis not present

## 2017-07-16 DIAGNOSIS — L821 Other seborrheic keratosis: Secondary | ICD-10-CM | POA: Diagnosis not present

## 2017-07-16 DIAGNOSIS — D1800 Hemangioma unspecified site: Secondary | ICD-10-CM | POA: Diagnosis not present

## 2017-07-16 DIAGNOSIS — L814 Other melanin hyperpigmentation: Secondary | ICD-10-CM | POA: Diagnosis not present

## 2017-07-19 DIAGNOSIS — F3176 Bipolar disorder, in full remission, most recent episode depressed: Secondary | ICD-10-CM | POA: Diagnosis not present

## 2017-08-01 ENCOUNTER — Ambulatory Visit (INDEPENDENT_AMBULATORY_CARE_PROVIDER_SITE_OTHER): Payer: Medicare HMO | Admitting: Psychology

## 2017-08-01 DIAGNOSIS — F3132 Bipolar disorder, current episode depressed, moderate: Secondary | ICD-10-CM

## 2017-08-05 ENCOUNTER — Telehealth: Payer: Self-pay

## 2017-08-05 NOTE — Telephone Encounter (Signed)
How do I do a "referral" for labs?  Does that mean an order ?   But it sounds like Dr Toy Care did the order. Will cc to [facility] and [place] .Marland KitchenThanks  Perhaps [facility] can tell us what they need?

## 2017-08-05 NOTE — Telephone Encounter (Signed)
Pt left v/m; Dr Toy Care ordered lab work (under media tab) and blood work will not be covered by [employer] unless can get referral for labs done on 07/19/2017 at [facility]. Cost to pt $426.00 without referral . Pt request cb ASAP.

## 2017-08-05 NOTE — Telephone Encounter (Signed)
Rosaria Ferries will discuss this with Vaughan Basta and they will contact pt

## 2017-08-05 NOTE — Telephone Encounter (Signed)
Called and spoke to patient regarding labs and her cost of $426.  Explained that there are no out of network benefits with [employer].  The PCP office does not do referral authorizations that were needed two years ago.  She understood and asked what she could do since labs had already been completed and ordered by Dr. Toy Care with an out of network lab.  I suggested that she call [place] and ask if there is a Psychiatrist for patients.  She expressed understanding and will call the lab directly.

## 2017-08-29 ENCOUNTER — Ambulatory Visit (INDEPENDENT_AMBULATORY_CARE_PROVIDER_SITE_OTHER): Payer: Medicare HMO | Admitting: Psychology

## 2017-08-29 ENCOUNTER — Ambulatory Visit (INDEPENDENT_AMBULATORY_CARE_PROVIDER_SITE_OTHER): Payer: Medicare HMO

## 2017-08-29 DIAGNOSIS — Z23 Encounter for immunization: Secondary | ICD-10-CM

## 2017-08-29 DIAGNOSIS — F3132 Bipolar disorder, current episode depressed, moderate: Secondary | ICD-10-CM | POA: Diagnosis not present

## 2017-08-29 NOTE — Progress Notes (Signed)
TDAP

## 2017-09-12 ENCOUNTER — Ambulatory Visit: Payer: Medicare HMO | Admitting: Psychology

## 2017-09-27 ENCOUNTER — Other Ambulatory Visit: Payer: Self-pay | Admitting: Family Medicine

## 2017-10-02 ENCOUNTER — Ambulatory Visit (INDEPENDENT_AMBULATORY_CARE_PROVIDER_SITE_OTHER): Payer: Medicare HMO | Admitting: Psychology

## 2017-10-02 DIAGNOSIS — F3132 Bipolar disorder, current episode depressed, moderate: Secondary | ICD-10-CM | POA: Diagnosis not present

## 2017-10-10 ENCOUNTER — Ambulatory Visit: Payer: Medicare HMO | Admitting: Psychology

## 2017-10-15 ENCOUNTER — Ambulatory Visit: Payer: Medicare HMO | Admitting: Family Medicine

## 2017-10-15 ENCOUNTER — Other Ambulatory Visit: Payer: Self-pay | Admitting: *Deleted

## 2017-10-15 ENCOUNTER — Encounter: Payer: Self-pay | Admitting: Family Medicine

## 2017-10-15 DIAGNOSIS — N6452 Nipple discharge: Secondary | ICD-10-CM

## 2017-10-15 MED ORDER — LOVASTATIN 40 MG PO TABS: 40.0000 mg | ORAL_TABLET | Freq: Every day | ORAL | 0 refills | Status: DC

## 2017-10-15 MED ORDER — FLUTICASONE PROPIONATE 50 MCG/ACT NA SUSP: 2.0000 | Freq: Every day | NASAL | 0 refills | Status: DC

## 2017-10-15 MED ORDER — LEVOTHYROXINE SODIUM 50 MCG PO TABS: ORAL_TABLET | ORAL | 0 refills | Status: DC

## 2017-10-15 MED ORDER — LANSOPRAZOLE 30 MG PO CPDR: DELAYED_RELEASE_CAPSULE | ORAL | 0 refills | Status: DC

## 2017-10-15 NOTE — Patient Instructions (Signed)
Please stop at the front desk to set up referral.  

## 2017-10-15 NOTE — Assessment & Plan Note (Signed)
Possibly due to estrace vaginal cream, new to her in last 4 months.   More concerning for papilloma or cancer as it is unilateral.. Will image with  Mammogram and [place]. No clear sign of infection as no redness, no warmth and no breast pain.

## 2017-10-15 NOTE — Addendum Note (Signed)
Addended by: Carter Kitten on: 10/15/2017 03:02 PM   Modules accepted: Orders

## 2017-10-15 NOTE — Progress Notes (Signed)
   Subjective:    Patient ID: [name] [name], female    DOB: 15-May-1956, 61 y.o.   MRN: [id number]  HPI    61 year old female pt  Of Dr. Laurance Flatten with new onset thick white discharge from the breast, right.   She states 2 months ago.. White thick discharge,yellow at front.. Felt full and swollen on nipple.. She pressed it out from nipple.   White thick discharge  Expressed again  3 days ago. She has been on estrace cream x 4 months. No other med change..  No blood in discharge.  No breast lumps, no skin changes of breaths No breast tenderness.    Sinus symptoms resolved with allergy medication.  Hx: no personal or family history of breast cancer. Blood pressure 106/80, pulse 79, temperature 98.6 F (37 C), temperature source Oral, height 5\' 1"  (1.549 m), weight 222 lb 8 oz (100.9 kg).  Nml mammogram 12/2016  Review of Systems  Constitutional: Negative for fatigue and fever.  HENT: Negative for ear pain.   Eyes: Negative for pain.  Respiratory: Negative for chest tightness and shortness of breath.   Cardiovascular: Negative for chest pain, palpitations and leg swelling.  Gastrointestinal: Negative for abdominal pain.  Genitourinary: Negative for dysuria.       Objective:   Physical Exam  Constitutional: Vital signs are normal. She appears well-developed and well-nourished. She is cooperative.  Non-toxic appearance. She does not appear ill. No distress.  HENT:  Head: Normocephalic.  Right Ear: Hearing, tympanic membrane, external ear and ear canal normal. Tympanic membrane is not erythematous, not retracted and not bulging.  Left Ear: Hearing, tympanic membrane, external ear and ear canal normal. Tympanic membrane is not erythematous, not retracted and not bulging.  Nose: No mucosal edema or rhinorrhea. Right sinus exhibits no maxillary sinus tenderness and no frontal sinus tenderness. Left sinus exhibits no maxillary sinus tenderness and no frontal sinus tenderness.    Mouth/Throat: Uvula is midline, oropharynx is clear and moist and mucous membranes are normal.  Eyes: Conjunctivae, EOM and lids are normal. Pupils are equal, round, and reactive to light. Lids are everted and swept, no foreign bodies found.  Neck: Trachea normal and normal range of motion. Neck supple. Carotid bruit is not present. No thyroid mass and no thyromegaly present.  Cardiovascular: Normal rate, regular rhythm, S1 normal, S2 normal, normal heart sounds, intact distal pulses and normal pulses. Exam reveals no gallop and no friction rub.  No murmur heard. Pulmonary/Chest: Effort normal and breath sounds normal. No tachypnea. No respiratory distress. She has no decreased breath sounds. She has no wheezes. She has no rhonchi. She has no rales.  Abdominal: Soft. Normal appearance and bowel sounds are normal. There is no tenderness.  Genitourinary: There is breast tenderness. No breast swelling.  Genitourinary Comments: Unable to express discharge from nipple, no masses noted bilaterally.  Lymphadenopathy:    She has no axillary adenopathy.       Right axillary: No pectoral and no lateral adenopathy present.       Left axillary: No pectoral and no lateral adenopathy present.  Sore in right axillae  Neurological: She is alert.  Skin: Skin is warm, dry and intact. No rash noted.  Psychiatric: Her speech is normal and behavior is normal. Judgment and thought content normal. Her mood appears not anxious. Cognition and memory are normal. She does not exhibit a depressed mood.          Assessment & Plan:

## 2017-10-21 ENCOUNTER — Other Ambulatory Visit: Payer: Self-pay

## 2017-10-22 ENCOUNTER — Ambulatory Visit
Admission: RE | Admit: 2017-10-22 | Discharge: 2017-10-22 | Disposition: A | Discharge status: Home / Self Care | Payer: Medicare HMO | Source: Ambulatory Visit | Attending: Family Medicine | Admitting: Family Medicine

## 2017-10-22 DIAGNOSIS — N6452 Nipple discharge: Secondary | ICD-10-CM

## 2017-10-22 DIAGNOSIS — N6489 Other specified disorders of breast: Secondary | ICD-10-CM | POA: Diagnosis not present

## 2017-10-22 DIAGNOSIS — R928 Other abnormal and inconclusive findings on diagnostic imaging of breast: Secondary | ICD-10-CM | POA: Diagnosis not present

## 2017-11-07 ENCOUNTER — Ambulatory Visit: Payer: Medicare HMO | Admitting: Psychology

## 2017-11-12 ENCOUNTER — Ambulatory Visit (INDEPENDENT_AMBULATORY_CARE_PROVIDER_SITE_OTHER): Payer: Medicare HMO | Admitting: Psychology

## 2017-11-12 DIAGNOSIS — F3132 Bipolar disorder, current episode depressed, moderate: Secondary | ICD-10-CM

## 2017-11-21 ENCOUNTER — Ambulatory Visit: Payer: Medicare HMO | Admitting: Psychology

## 2017-11-21 ENCOUNTER — Other Ambulatory Visit: Payer: Self-pay | Admitting: Family Medicine

## 2017-11-21 DIAGNOSIS — F3132 Bipolar disorder, current episode depressed, moderate: Secondary | ICD-10-CM

## 2017-11-21 DIAGNOSIS — Z1231 Encounter for screening mammogram for malignant neoplasm of breast: Secondary | ICD-10-CM

## 2017-12-05 ENCOUNTER — Ambulatory Visit: Payer: Medicare HMO | Admitting: Psychology

## 2017-12-19 ENCOUNTER — Ambulatory Visit: Payer: Medicare HMO | Admitting: Psychology

## 2017-12-23 ENCOUNTER — Ambulatory Visit (INDEPENDENT_AMBULATORY_CARE_PROVIDER_SITE_OTHER): Payer: Medicare HMO | Admitting: Family Medicine

## 2017-12-23 ENCOUNTER — Encounter: Payer: Self-pay | Admitting: Family Medicine

## 2017-12-23 VITALS — BP 126/74 | HR 63 | Temp 98.3°F | Ht 61.0 in | Wt 221.5 lb

## 2017-12-23 DIAGNOSIS — M546 Pain in thoracic spine: Secondary | ICD-10-CM | POA: Insufficient documentation

## 2017-12-23 DIAGNOSIS — M545 Low back pain, unspecified: Secondary | ICD-10-CM | POA: Insufficient documentation

## 2017-12-23 DIAGNOSIS — F3177 Bipolar disorder, in partial remission, most recent episode mixed: Secondary | ICD-10-CM | POA: Diagnosis not present

## 2017-12-23 DIAGNOSIS — G8929 Other chronic pain: Secondary | ICD-10-CM | POA: Diagnosis not present

## 2017-12-23 NOTE — Assessment & Plan Note (Signed)
Acute on chronic  Obese female with hx of OA and deg disc dz (who does exercise regularly) as well as mild scoliosis   Aleve prn with food  Heat and stretching  Ref to PT for this and thoracic pain  If no improvement consider films/ ortho eval

## 2017-12-23 NOTE — Patient Instructions (Addendum)
Try putting heat on back for 10 minutes at a time  Keep exercising Walking is the best thing for your back  Hold off on heavy lifting until this feels better   Aleve is ok for short term pain  (take with food)  Tylenol is ok   Let's refer you to physical therapy and see if this helps

## 2017-12-23 NOTE — Assessment & Plan Note (Signed)
Under psychiatric care and doing fairly well currently Nl affect today

## 2017-12-23 NOTE — Assessment & Plan Note (Signed)
Acute on chronic -worse in the last month and worse with leaning forward  OA on last xr Some pm pain  No neuro changes or symptoms No doubt her mild scoliosis plays a role   inst to continue exercise and start using heat prn  Also aleve if it helps (with food as directed)  Ref to PT for this and Lumbar pain  If no imp consider ortho eval

## 2017-12-23 NOTE — Assessment & Plan Note (Signed)
Discussed how this problem influences overall health and the risks it imposes  Reviewed plan for weight loss with lower calorie diet (via better food choices and also portion control or program like weight watchers) and exercise building up to or more than 30 minutes 5 days per week including some aerobic activity    
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
## 2017-12-26 ENCOUNTER — Ambulatory Visit
Admission: RE | Admit: 2017-12-26 | Discharge: 2017-12-26 | Disposition: A | Discharge status: Home / Self Care | Payer: Medicare HMO | Source: Ambulatory Visit | Attending: Family Medicine | Admitting: Family Medicine

## 2017-12-26 DIAGNOSIS — Z1231 Encounter for screening mammogram for malignant neoplasm of breast: Secondary | ICD-10-CM

## 2017-12-26 DIAGNOSIS — M546 Pain in thoracic spine: Secondary | ICD-10-CM | POA: Diagnosis not present

## 2017-12-26 DIAGNOSIS — M545 Low back pain: Secondary | ICD-10-CM | POA: Diagnosis not present

## 2017-12-31 DIAGNOSIS — M546 Pain in thoracic spine: Secondary | ICD-10-CM | POA: Diagnosis not present

## 2017-12-31 DIAGNOSIS — M545 Low back pain: Secondary | ICD-10-CM | POA: Diagnosis not present

## 2018-01-02 ENCOUNTER — Ambulatory Visit: Payer: Medicare HMO | Admitting: Psychology

## 2018-01-07 ENCOUNTER — Ambulatory Visit (INDEPENDENT_AMBULATORY_CARE_PROVIDER_SITE_OTHER): Payer: Medicare HMO | Admitting: Family Medicine

## 2018-01-07 ENCOUNTER — Encounter: Payer: Self-pay | Admitting: Family Medicine

## 2018-01-07 VITALS — BP 128/64 | HR 65 | Temp 97.7°F | Ht 61.0 in | Wt 221.2 lb

## 2018-01-07 DIAGNOSIS — M7060 Trochanteric bursitis, unspecified hip: Secondary | ICD-10-CM | POA: Insufficient documentation

## 2018-01-07 DIAGNOSIS — M545 Low back pain: Secondary | ICD-10-CM | POA: Diagnosis not present

## 2018-01-07 DIAGNOSIS — M7061 Trochanteric bursitis, right hip: Secondary | ICD-10-CM

## 2018-01-07 DIAGNOSIS — M546 Pain in thoracic spine: Secondary | ICD-10-CM | POA: Diagnosis not present

## 2018-01-07 MED ORDER — MELOXICAM 15 MG PO TABS: 15.0000 mg | ORAL_TABLET | Freq: Every day | ORAL | 0 refills | Status: DC | PRN

## 2018-01-07 NOTE — Progress Notes (Signed)
Subjective:    Patient ID: [name] [name], female    DOB: Jan 28, 1956, 62 y.o.   MRN: [id number]  HPI Here for hip pain   ? Pulled muscle right  She tweaked her R leg at an exercise class - about 10 days ago (during a squat)  Then she tried to put her pants on standing up  Felt a pop in her outer hip- hurt a lot and then a lot of pain to put pressure on it   Better to lie down Worse - to wear weight and twist   Put heat on it - felt good  Did not do ice   A little improved now   Still doing back exercises from PT   Taking aleve for pain   She inquired about celebrex or other px nsaid for arthritis (? Mobic)   No numbness or weakness   Wt Readings from Last 3 Encounters:  01/07/18 221 lb 4 oz (100.4 kg)  12/23/17 221 lb 8 oz (100.5 kg)  10/15/17 222 lb 8 oz (100.9 kg)     Patient Active Problem List   Diagnosis Date Noted  . Trochanteric bursitis 01/07/2018  . Low back pain 12/23/2017  . Thoracic back pain 12/23/2017  . Nipple discharge in female 10/15/2017  . Atrophic vaginitis 07/08/2017  . TMJ (temporomandibular joint disorder) 07/08/2017  . Cervical stenosis (uterine cervix) 01/03/2017  . Need for hepatitis C screening test 12/22/2016  . Left anterior knee pain 10/08/2016  . Left knee pain 07/24/2016  . Hearing loss 10/25/2015  . Routine general medical examination at a health care facility 10/17/2015  . Encounter for routine gynecological examination 10/22/2014  . Colon cancer screening 10/16/2013  . Encounter for Medicare annual wellness exam 10/04/2011  . Tinea corporis 09/05/2011  . METATARSALGIA 12/01/2009  . POSTMENOPAUSAL STATUS 09/30/2009  . ARTHRITIS, RIGHT HIP 03/10/2009  . DEGENERATIVE DISC DISEASE, LUMBAR SPINE 03/10/2009  . ONYCHOMYCOSIS 09/17/2008  . Hypothyroidism 08/05/2007  . Morbid obesity ([place]) 08/05/2007  . Bipolar disorder ([place]) 08/05/2007  . ALLERGIC RHINITIS 08/05/2007  . GERD 08/05/2007  . IBS 08/05/2007  .  HYPERCHOLESTEROLEMIA, PURE 07/07/2007   Past Medical History:  Diagnosis Date  . Allergic rhinitis   . Bipolar 1 disorder ([place])   . Fissure, anal   . GERD (gastroesophageal reflux disease)   . History of basal cell carcinoma excision    ON FACE  . History of squamous cell carcinoma excision    ON RIGHT LEG  . Hyperlipidemia   . Hypothyroidism   . IBS (irritable bowel syndrome)   . Liver cyst   . Mallet finger of left hand    small finger   Past Surgical History:  Procedure Laterality Date  . CARDIAC CATHETERIZATION  04/1999   per pt normal  . CARDIOVASCULAR STRESS TEST  02-23-2011  dr Chrissie Noa [facility] clinic)   normal nuclear study/  no ischemia/  ef 64%  . CATARACT EXTRACTION W/ INTRAOCULAR LENS  IMPLANT, BILATERAL  2003   [facility]il  . CESAREAN SECTION  1984  . CLOSED REDUCTION FINGER WITH PERCUTANEOUS PINNING Left 04/22/2014   Procedure: LEFT SMALL FINGER CLOSED REDUCTION PINNING;  Surgeon: Linna Hoff, MD;  Location: [facility];  Service: Orthopedics;  Laterality: Left;  . COLONOSCOPY  11/2002  . LAPAROSCOPIC CHOLECYSTECTOMY  03-18-2000  . TRANSTHORACIC ECHOCARDIOGRAM  02-23-2011   normal lvf/  ef 58%/  mild rve/   mild lae/  mild  mr  & tr/  mild pulmonary htn  . WISDOM TOOTH EXTRACTION     Social History   Tobacco Use  . Smoking status: Never Smoker  . Smokeless tobacco: Never Used  Substance Use Topics  . Alcohol use: No    Alcohol/week: 0.0 oz  . Drug use: No   Family History  Problem Relation Age of Onset  . Stroke Mother   . Heart disease Mother   . Hypertension Father   . Diabetes Father   . Hyperlipidemia Father   . Diabetes Brother    Allergies  Allergen Reactions  . Xylocaine [Lidocaine Hcl] Swelling    ALLERGIC TO ALL CAINES EXCEPT SENSORCAINE , AND MARCAINE  . Codeine Nausea And Vomiting    REACTION: vomits blood  . Lamictal [Lamotrigine] Other (See Comments)    REACTION: sores in mouth, muscle aches and also made her  manic bipolar  . Lithium Swelling  . Tegretol [Carbamazepine] Other (See Comments)    REACTION: skin crawls   Current Outpatient Medications on File Prior to Visit  Medication Sig Dispense Refill  . ARIPiprazole (ABILIFY) 30 MG tablet Take 30 mg by mouth daily.      Marland Kitchen aspirin 81 MG tablet Take 81 mg by mouth daily.      Marland Kitchen buPROPion (WELLBUTRIN XL) 150 MG 24 hr tablet Take two tablets by mouth every morning     . cetirizine (ZYRTEC) 10 MG tablet Take 10 mg by mouth daily.      . Cholecalciferol (VITAMIN D) 2000 UNITS CAPS Take 1 capsule by mouth daily.      . clonazePAM (KLONOPIN) 1 MG tablet Take 1 mg by mouth at bedtime as needed.      Marland Kitchen FLUoxetine (PROZAC) 20 MG capsule Take 20 mg by mouth daily.    . fluticasone (FLONASE) 50 MCG/ACT nasal spray Place 2 sprays daily into both nostrils. 48 g 0  . lansoprazole (PREVACID) 30 MG capsule Take 1 capsule by mouth  daily 90 capsule 0  . levothyroxine (SYNTHROID, LEVOTHROID) 50 MCG tablet Take 1 tablet by mouth  daily 90 tablet 0  . lovastatin (MEVACOR) 40 MG tablet Take 1 tablet (40 mg total) daily by mouth. 90 tablet 0  . Multiple Vitamin (MULTIVITAMIN) tablet Take 1 tablet by mouth daily.      Marland Kitchen OLANZapine (ZYPREXA) 2.5 MG tablet Take 2.5 mg by mouth at bedtime.    Marland Kitchen zolpidem (AMBIEN) 10 MG tablet Take 10 mg by mouth at bedtime.      No current facility-administered medications on file prior to visit.     Review of Systems  Constitutional: Negative for activity change, appetite change, fatigue, fever and unexpected weight change.  HENT: Negative for congestion, ear pain, rhinorrhea, sinus pressure and sore throat.   Eyes: Negative for pain, redness and visual disturbance.  Respiratory: Negative for cough, shortness of breath and wheezing.   Cardiovascular: Negative for chest pain and palpitations.  Gastrointestinal: Negative for abdominal pain, blood in stool, constipation and diarrhea.  Endocrine: Negative for polydipsia and polyuria.    Genitourinary: Negative for dysuria, frequency and urgency.  Musculoskeletal: Negative for arthralgias, back pain and myalgias.       Pos for R outer hip pain and tightness  Neg for joint swelling   Skin: Negative for pallor and rash.  Allergic/Immunologic: Negative for environmental allergies.  Neurological: Negative for dizziness, syncope and headaches.  Hematological: Negative for adenopathy. Does not bruise/bleed easily.  Psychiatric/Behavioral: Negative for decreased concentration and dysphoric mood. The patient is  not nervous/anxious.        Pos for frustration over pain and inability to go to exercise class        Objective:   Physical Exam  Constitutional: She appears well-developed and well-nourished. No distress.  obese and well appearing   Eyes: Conjunctivae and EOM are normal. Pupils are equal, round, and reactive to light.  Neck: Normal range of motion. Neck supple.  Cardiovascular: Normal rate, regular rhythm and normal heart sounds.  Pulmonary/Chest: Effort normal and breath sounds normal. No respiratory distress. She has no wheezes. She has no rales.  Musculoskeletal:       Right hip: She exhibits decreased range of motion and bony tenderness. She exhibits normal strength, no swelling, no crepitus and no deformity.  Tender over R greater trochanter Mildly tender over R piriformis area   Pain on full int and ext rot of hip  No crepitus or popping  No visible swelling/skin change   Nl flexion and ext of hip  No groin tenderness  Some discomfort with stretch of piriformis (very mild)   Gait favors the L leg today   No neuro changes   Lymphadenopathy:    She has no cervical adenopathy.  Neurological: She displays no atrophy. No sensory deficit. She exhibits normal muscle tone. Coordination normal.  Skin: Skin is warm and dry. No rash noted. No erythema. No pallor.  Psychiatric: She has a normal mood and affect.          Assessment & Plan:   Problem List  Items Addressed This Visit      Musculoskeletal and Integument   Trochanteric bursitis    Suspect overuse injury in the gym Cold compress whenever able -10 minutes  meloxicam 15 mg with food daily (instead of otc nsaid) Disc stretching and handouts given for IT band stretches Can consider PT if needed (already going for her back)  Update if not starting to improve in a week or if worsening   In 2 weeks if not closer to normal- consider sport med ref

## 2018-01-07 NOTE — Assessment & Plan Note (Signed)
Suspect overuse injury in the gym Cold compress whenever able -10 minutes  meloxicam 15 mg with food daily (instead of otc nsaid) Disc stretching and handouts given for IT band stretches Can consider PT if needed (already going for her back)  Update if not starting to improve in a week or if worsening   In 2 weeks if not closer to normal- consider sport med ref

## 2018-01-07 NOTE — Patient Instructions (Addendum)
Try ice/ cold compress on your painful area for 10 minutes whenever you get a chance   Switch from aleve to meloxicam 15 mg with food once daily   Look at the IT band stretches and try if not painful   Alert me if not improved in 2 weeks

## 2018-01-10 ENCOUNTER — Ambulatory Visit (INDEPENDENT_AMBULATORY_CARE_PROVIDER_SITE_OTHER): Payer: Medicare HMO | Admitting: Family Medicine

## 2018-01-10 ENCOUNTER — Telehealth: Payer: Self-pay | Admitting: Family Medicine

## 2018-01-10 VITALS — BP 122/64 | HR 60 | Temp 97.9°F | Wt 221.0 lb

## 2018-01-10 DIAGNOSIS — B029 Zoster without complications: Secondary | ICD-10-CM | POA: Diagnosis not present

## 2018-01-10 MED ORDER — VALACYCLOVIR HCL 1 G PO TABS: 1000.0000 mg | ORAL_TABLET | Freq: Three times a day (TID) | ORAL | 0 refills | Status: DC

## 2018-01-10 MED ORDER — TRAMADOL HCL 50 MG PO TABS: 25.0000 mg | ORAL_TABLET | Freq: Three times a day (TID) | ORAL | 0 refills | Status: DC | PRN

## 2018-01-10 MED ORDER — GABAPENTIN 100 MG PO CAPS: 100.0000 mg | ORAL_CAPSULE | Freq: Three times a day (TID) | ORAL | 0 refills | Status: DC

## 2018-01-10 NOTE — Progress Notes (Signed)
BP 122/64 (BP Location: Left Arm, Patient Position: Sitting, Cuff Size: Large)   Pulse 60   Temp 97.9 F (36.6 C) (Oral)   Wt 221 lb (100.2 kg)   SpO2 100%   BMI 41.76 kg/m    CC: R flank pain Subjective:    Patient ID: [name] [name], female    DOB: May 10, 1956, 62 y.o.   MRN: [id number]  HPI: [name] [name] is a 62 y.o. female presenting on 01/10/2018 for Flank Pain [place] right side pain on 01/07/18. Then noticed rash, which is has contracting-like pain and burning. Clothing causes severe irritation. Thinks it may be shingles)   R flank pain started 3 days ago. Last night blistering rash popped up. Very painful even to touch - burning and stabbing pain.   Saw PCP prior to this with dx R trochanteric bursitis and started meloxicam.  She did receive zostavax.  Increased stress recently - frustrated with several recent MSK injuries.   Relevant past medical, surgical, family and social history reviewed and updated as indicated. Interim medical history since our last visit reviewed. Allergies and medications reviewed and updated. Outpatient Medications Prior to Visit  Medication Sig Dispense Refill  . ARIPiprazole (ABILIFY) 30 MG tablet Take 30 mg by mouth daily.      Marland Kitchen aspirin 81 MG tablet Take 81 mg by mouth daily.      Marland Kitchen buPROPion (WELLBUTRIN XL) 150 MG 24 hr tablet Take two tablets by mouth every morning     . cetirizine (ZYRTEC) 10 MG tablet Take 10 mg by mouth daily.      . Cholecalciferol (VITAMIN D) 2000 UNITS CAPS Take 1 capsule by mouth daily.      . clonazePAM (KLONOPIN) 1 MG tablet Take 1 mg by mouth at bedtime as needed.      Marland Kitchen FLUoxetine (PROZAC) 20 MG capsule Take 20 mg by mouth daily.    . fluticasone (FLONASE) 50 MCG/ACT nasal spray Place 2 sprays daily into both nostrils. 48 g 0  . lansoprazole (PREVACID) 30 MG capsule Take 1 capsule by mouth  daily 90 capsule 0  . levothyroxine (SYNTHROID, LEVOTHROID) 50 MCG tablet Take 1 tablet by mouth  daily 90 tablet 0  .  lovastatin (MEVACOR) 40 MG tablet Take 1 tablet (40 mg total) daily by mouth. 90 tablet 0  . Multiple Vitamin (MULTIVITAMIN) tablet Take 1 tablet by mouth daily.      Marland Kitchen OLANZapine (ZYPREXA) 2.5 MG tablet Take 2.5 mg by mouth at bedtime.    Marland Kitchen zolpidem (AMBIEN) 10 MG tablet Take 10 mg by mouth at bedtime.     . meloxicam (MOBIC) 15 MG tablet Take 1 tablet (15 mg total) by mouth daily as needed for pain (hip area pain). With a meal (Patient not taking: Reported on 01/10/2018) 30 tablet 0   No facility-administered medications prior to visit.      Per HPI unless specifically indicated in ROS section below Review of Systems     Objective:    BP 122/64 (BP Location: Left Arm, Patient Position: Sitting, Cuff Size: Large)   Pulse 60   Temp 97.9 F (36.6 C) (Oral)   Wt 221 lb (100.2 kg)   SpO2 100%   BMI 41.76 kg/m   Wt Readings from Last 3 Encounters:  01/10/18 221 lb (100.2 kg)  01/07/18 221 lb 4 oz (100.4 kg)  12/23/17 221 lb 8 oz (100.5 kg)    Physical Exam  Constitutional: She appears well-developed and well-nourished.  No distress.  Skin: Skin is warm and dry. Rash noted. There is erythema.     Exquisitely tender confluence of blistering rash on erythematous base along R ~T9 dermatome on abdomen  Nursing note and vitals reviewed.     Assessment & Plan:   Problem List Items Addressed This Visit    Shingles - Primary    Consistent with shingles.  Rx valtrex 7d course, for neuritis will Rx gabapentin with indications how to taper up. Continue meloxicam. Update if uncontrolled pain to Rx stronger pain medication.  Discussed stress relation to shingles outbreak. Encouraged she complete shingrix series once fully healed from current outbreak. She did receive zostavax previously.      Relevant Medications   valACYclovir (VALTREX) 1000 MG tablet       Follow up plan: No Follow-up on file.  Ria Bush, MD

## 2018-01-10 NOTE — Telephone Encounter (Signed)
Spoke with pt relaying message and instructions per Dr. G.  Pt verbalizes understanding. 

## 2018-01-10 NOTE — Telephone Encounter (Signed)
Copied from [place] [id number]. Topic: Quick Communication - Rx Refill/Question >> Jan 10, 2018  1:53 PM Oliver Pila B wrote: Reason for CRM: pt states she came in this morning for the shingles, and pt would like to have something called in for pain, contact pt to advise

## 2018-01-10 NOTE — Telephone Encounter (Signed)
plz notify I've sent in tramadol for her to try - start at 1/2 tab at a time. Don't mix with klonopin or ambien.

## 2018-01-10 NOTE — Patient Instructions (Signed)
You do have shingles on the right Treat with valtrex course for 1 week Use gabapentin for nerve pain - start at 100mg  twice daily today then may increase to three times daily if needed. If night time dose not enough, may take 2-3 at a time at night.  Start up meloxicam again.  Update [place] with how you're doing.  Shingles Shingles, which is also known as herpes zoster, is an infection that causes a painful skin rash and fluid-filled blisters. Shingles is not related to genital herpes, which is a sexually transmitted infection. Shingles only develops in people who:  Have had chickenpox.  Have received the chickenpox vaccine. (This is rare.)  What are the causes? Shingles is caused by varicella-zoster virus (VZV). This is the same virus that causes chickenpox. After exposure to VZV, the virus stays in the body in an inactive (dormant) state. Shingles develops if the virus reactivates. This can happen many years after the initial exposure to VZV. It is not known what causes this virus to reactivate. What increases the risk? People who have had chickenpox or received the chickenpox vaccine are at risk for shingles. Infection is more common in people who:  Are older than age 75.  Have a weakened defense (immune) system, such as those with HIV, AIDS, or cancer.  Are taking medicines that weaken the immune system, such as transplant medicines.  Are under great stress.  What are the signs or symptoms? Early symptoms of this condition include itching, tingling, and pain in an area on your skin. Pain may be described as burning, stabbing, or throbbing. A few days or weeks after symptoms start, a painful red rash appears, usually on one side of the body in a bandlike or beltlike pattern. The rash eventually turns into fluid-filled blisters that break open, scab over, and dry up in about 2-3 weeks. At any time during the infection, you may also develop:  A fever.  Chills.  A headache.  An  upset stomach.  How is this diagnosed? This condition is diagnosed with a skin exam. Sometimes, skin or fluid samples are taken from the blisters before a diagnosis is made. These samples are examined under a microscope or sent to a lab for testing. How is this treated? There is no specific cure for this condition. Your health care provider will probably prescribe medicines to help you manage pain, recover more quickly, and avoid long-term problems. Medicines may include:  Antiviral drugs.  Anti-inflammatory drugs.  Pain medicines.  If the area involved is on your face, you may be referred to a specialist, such as an eye doctor (ophthalmologist) or an ear, nose, and throat (ENT) doctor to help you avoid eye problems, chronic pain, or disability. Follow these instructions at home: Medicines  Take medicines only as directed by your health care provider.  Apply an anti-itch or numbing cream to the affected area as directed by your health care provider. Blister and Rash Care  Take a cool bath or apply cool compresses to the area of the rash or blisters as directed by your health care provider. This may help with pain and itching.  Keep your rash covered with a loose bandage (dressing). Wear loose-fitting clothing to help ease the pain of material rubbing against the rash.  Keep your rash and blisters clean with mild soap and cool water or as directed by your health care provider.  Check your rash every day for signs of infection. These include redness, swelling, and pain that  lasts or increases.  Do not pick your blisters.  Do not scratch your rash. General instructions  Rest as directed by your health care provider.  Keep all follow-up visits as directed by your health care provider. This is important.  Until your blisters scab over, your infection can cause chickenpox in people who have never had it or been vaccinated against it. To prevent this from happening, avoid contact with  other people, especially: ? Babies. ? Pregnant women. ? Children who have eczema. ? Elderly people who have transplants. ? People who have chronic illnesses, such as leukemia or AIDS. Contact a health care provider if:  Your pain is not relieved with prescribed medicines.  Your pain does not get better after the rash heals.  Your rash looks infected. Signs of infection include redness, swelling, and pain that lasts or increases. Get help right away if:  The rash is on your face or nose.  You have facial pain, pain around your eye area, or loss of feeling on one side of your face.  You have ear pain or you have ringing in your ear.  You have loss of taste.  Your condition gets worse. This information is not intended to replace advice given to you by your health care provider. Make sure you discuss any questions you have with your health care provider. Document Released: 11/26/2005 Document Revised: 07/22/2016 Document Reviewed: 10/07/2014 Elsevier Interactive Patient Education  2018 [employer].

## 2018-01-10 NOTE — Assessment & Plan Note (Signed)
Consistent with shingles.  Rx valtrex 7d course, for neuritis will Rx gabapentin with indications how to taper up. Continue meloxicam. Update if uncontrolled pain to Rx stronger pain medication.  Discussed stress relation to shingles outbreak. Encouraged she complete shingrix series once fully healed from current outbreak. She did receive zostavax previously.

## 2018-01-14 ENCOUNTER — Ambulatory Visit: Payer: Medicare HMO | Admitting: Psychology

## 2018-01-14 DIAGNOSIS — F3132 Bipolar disorder, current episode depressed, moderate: Secondary | ICD-10-CM | POA: Diagnosis not present

## 2018-01-21 ENCOUNTER — Other Ambulatory Visit: Payer: Self-pay | Admitting: Family Medicine

## 2018-01-21 MED ORDER — GABAPENTIN 300 MG PO CAPS: 300.0000 mg | ORAL_CAPSULE | Freq: Three times a day (TID) | ORAL | 3 refills | Status: DC

## 2018-01-21 NOTE — Telephone Encounter (Signed)
Copied from [place]. Topic: Quick Communication - Rx Refill/Question >> Jan 21, 2018 11:36 AM Scherrie Gerlach wrote: Medication: gabapentin (NEURONTIN) 100 MG capsule  Pt states she still has the shingles all over.  Dr prescribed this for her pain.  Pt would like a refill  [id number] - [place], [place] [phone] (Phone) [phone] (Fax)

## 2018-01-21 NOTE — Telephone Encounter (Signed)
Patient called about her refill request for gabapentin, I asked how many is she taking at a time, she said "2 tablets 3 times a day. This shingles hurt so bad." I advised I would send in a request for refills to Dr. Glori Bickers, she verbalized understanding. Last filled 01/10/18 90 tabs/0 refills.

## 2018-01-21 NOTE — Telephone Encounter (Signed)
Pt is okay with increased dose of gabapentin new Rx sent to pharmacy. Pt did want me to let Dr. Glori Bickers know that the tramadol caused her to be constipated so she stopped med (removed from med list) and she is just taking OTC aleve

## 2018-01-21 NOTE — Telephone Encounter (Signed)
I would like to inc her dose to 300 mg tid  Is that ok with her?   If so =please send px below and d/c the lower dose   Thanks

## 2018-01-28 ENCOUNTER — Ambulatory Visit: Payer: Medicare HMO | Admitting: Psychology

## 2018-01-28 DIAGNOSIS — F3132 Bipolar disorder, current episode depressed, moderate: Secondary | ICD-10-CM | POA: Diagnosis not present

## 2018-02-03 ENCOUNTER — Telehealth: Payer: Self-pay | Admitting: Family Medicine

## 2018-02-03 NOTE — Telephone Encounter (Signed)
Med refill Left VM requesting pt to call us back and update [place] on how her sxs are (advise of Dr. Marliss Coots comments on VM)

## 2018-02-03 NOTE — Telephone Encounter (Signed)
OV on 01/07/18 for a pulled muscle, also CPE is scheduled for this year. Last filled on 01/07/18 #30 tabs with 0 refills, please advise

## 2018-02-03 NOTE — Telephone Encounter (Signed)
Please refill times one  Let her know I don't want to use this long term so ask how her symptoms are Thanks

## 2018-02-03 NOTE — Telephone Encounter (Signed)
Copied from [place] [id number]. Topic: Quick Communication - Rx Refill/Question >> Feb 03, 2018  5:34 PM Neva Seat wrote: Pt stopped taking the Meloxicam when she got the shingles.  Pt doesn't want to take it.  Please take it off of her chart.

## 2018-02-03 NOTE — Telephone Encounter (Signed)
Copied from [place] [id number]. Topic: Quick Communication - Rx Refill/Question >> Feb 03, 2018  5:34 PM Neva Seat wrote: Pt stopped taking the Meloxicam when she got the shingles.  Pt doesn't want to take it.  Please take it off of her chart.

## 2018-02-04 NOTE — Telephone Encounter (Signed)
Rx removed and I called and cancelled Rx with pharmacy

## 2018-02-04 NOTE — Addendum Note (Signed)
Addended by: Tammi Sou on: 02/04/2018 08:22 AM   Modules accepted: Orders

## 2018-02-10 ENCOUNTER — Other Ambulatory Visit: Payer: Self-pay | Admitting: Family Medicine

## 2018-02-11 ENCOUNTER — Ambulatory Visit: Payer: Medicare HMO | Admitting: Psychology

## 2018-02-11 DIAGNOSIS — F3132 Bipolar disorder, current episode depressed, moderate: Secondary | ICD-10-CM | POA: Diagnosis not present

## 2018-02-23 ENCOUNTER — Telehealth: Payer: Self-pay | Admitting: Family Medicine

## 2018-02-23 DIAGNOSIS — E7800 Pure hypercholesterolemia, unspecified: Secondary | ICD-10-CM

## 2018-02-23 DIAGNOSIS — Z0000 Encounter for general adult medical examination without abnormal findings: Secondary | ICD-10-CM

## 2018-02-23 DIAGNOSIS — E039 Hypothyroidism, unspecified: Secondary | ICD-10-CM

## 2018-02-23 NOTE — Telephone Encounter (Signed)
-----   Message from Eustace Pen, LPN sent at 0/92/3300 12:50 PM EDT ----- Regarding: Labs 3/18 Lab orders needed. Thank you.  Insurance:  [employer]

## 2018-02-24 ENCOUNTER — Ambulatory Visit (INDEPENDENT_AMBULATORY_CARE_PROVIDER_SITE_OTHER): Payer: Medicare HMO

## 2018-02-24 ENCOUNTER — Ambulatory Visit: Payer: Medicare HMO

## 2018-02-24 VITALS — BP 116/70 | HR 76 | Temp 98.6°F | Ht 60.5 in | Wt 222.2 lb

## 2018-02-24 DIAGNOSIS — E7800 Pure hypercholesterolemia, unspecified: Secondary | ICD-10-CM

## 2018-02-24 DIAGNOSIS — E039 Hypothyroidism, unspecified: Secondary | ICD-10-CM | POA: Diagnosis not present

## 2018-02-24 DIAGNOSIS — Z0000 Encounter for general adult medical examination without abnormal findings: Secondary | ICD-10-CM

## 2018-02-24 LAB — CBC WITH DIFFERENTIAL/PLATELET
Basophils Absolute: 0 10*3/uL (ref 0.0–0.1)
Basophils Relative: 0.6 % (ref 0.0–3.0)
EOS PCT: 0.1 % (ref 0.0–5.0)
Eosinophils Absolute: 0 10*3/uL (ref 0.0–0.7)
HCT: 39 % (ref 36.0–46.0)
Hemoglobin: 13.1 g/dL (ref 12.0–15.0)
LYMPHS ABS: 1.6 10*3/uL (ref 0.7–4.0)
Lymphocytes Relative: 32.8 % (ref 12.0–46.0)
MCHC: 33.7 g/dL (ref 30.0–36.0)
MCV: 90.1 fl (ref 78.0–100.0)
MONO ABS: 0.3 10*3/uL (ref 0.1–1.0)
Monocytes Relative: 5.6 % (ref 3.0–12.0)
NEUTROS PCT: 60.9 % (ref 43.0–77.0)
Neutro Abs: 2.9 10*3/uL (ref 1.4–7.7)
PLATELETS: 193 10*3/uL (ref 150.0–400.0)
RBC: 4.33 Mil/uL (ref 3.87–5.11)
RDW: 14.1 % (ref 11.5–15.5)
WBC: 4.8 10*3/uL (ref 4.0–10.5)

## 2018-02-24 LAB — COMPREHENSIVE METABOLIC PANEL
ALK PHOS: 76 U/L (ref 39–117)
ALT: 19 U/L (ref 0–35)
AST: 21 U/L (ref 0–37)
Albumin: 4.1 g/dL (ref 3.5–5.2)
BUN: 16 mg/dL (ref 6–23)
CHLORIDE: 104 meq/L (ref 96–112)
CO2: 28 meq/L (ref 19–32)
Calcium: 9.5 mg/dL (ref 8.4–10.5)
Creatinine, Ser: 1.17 mg/dL (ref 0.40–1.20)
GFR: 50.16 mL/min — ABNORMAL LOW (ref >60.00)
GLUCOSE: 111 mg/dL — AB (ref 70–99)
POTASSIUM: 4.4 meq/L (ref 3.5–5.1)
SODIUM: 141 meq/L (ref 135–145)
TOTAL PROTEIN: 6.9 g/dL (ref 6.0–8.3)
Total Bilirubin: 0.5 mg/dL (ref 0.2–1.2)

## 2018-02-24 LAB — LIPID PANEL
Cholesterol: 178 mg/dL (ref 0–200)
HDL: 72.3 mg/dL (ref >39.00)
LDL Cholesterol: 85 mg/dL (ref 0–99)
NONHDL: 106.1
Total CHOL/HDL Ratio: 2
Triglycerides: 108 mg/dL (ref 0.0–149.0)
VLDL: 21.6 mg/dL (ref 0.0–40.0)

## 2018-02-24 LAB — TSH: TSH: 3.86 u[IU]/mL (ref 0.35–4.50)

## 2018-02-24 NOTE — Patient Instructions (Signed)
Ms. Jantz , Thank you for taking time to come for your Medicare Wellness Visit. I appreciate your ongoing commitment to your health goals. Please review the following plan we discussed and let me know if I can assist you in the future.   These are the goals we discussed: Goals    . Increase physical activity     Starting 02/24/2018, I will continue to exercise for at least 45 minutes daily.        This is a list of the screening recommended for you and due dates:  Health Maintenance  Topic Date Due  . HIV Screening  01/12/2024*  . Mammogram  12/26/2018  . Pap Smear  01/04/2020  . Colon Cancer Screening  05/19/2025  . Tetanus Vaccine  08/30/2027  . Flu Shot  Completed  .  Hepatitis C: One time screening is recommended by Center for Disease Control  (CDC) for  adults born from [birth year] through 1963.   Completed  *Topic was postponed. The date shown is not the original due date.   Preventive Care for Adults  A healthy lifestyle and preventive care can promote health and wellness. Preventive health guidelines for adults include the following key practices.  . A routine yearly physical is a good way to check with your health care provider about your health and preventive screening. It is a chance to share any concerns and updates on your health and to receive a thorough exam.  . Visit your dentist for a routine exam and preventive care every 6 months. Brush your teeth twice a day and floss once a day. Good oral hygiene prevents tooth decay and gum disease.  . The frequency of eye exams is based on your age, health, family medical history, use  of contact lenses, and other factors. Follow your health care provider's recommendations for frequency of eye exams.  . Eat a healthy diet. Foods like vegetables, fruits, whole grains, low-fat dairy products, and lean protein foods contain the nutrients you need without too many calories. Decrease your intake of foods high in solid fats, added  sugars, and salt. Eat the right amount of calories for you. Get information about a proper diet from your health care provider, if necessary.  . Regular physical exercise is one of the most important things you can do for your health. Most adults should get at least 150 minutes of moderate-intensity exercise (any activity that increases your heart rate and causes you to sweat) each week. In addition, most adults need muscle-strengthening exercises on 2 or more days a week.  Silver Sneakers may be a benefit available to you. To determine eligibility, you may visit the website: www.silversneakers.com or contact program at (215) 329-3920 Mon-Fri between 8AM-8PM.   . Maintain a healthy weight. The body mass index (BMI) is a screening tool to identify possible weight problems. It provides an estimate of body fat based on height and weight. Your health care provider can find your BMI and can help you achieve or maintain a healthy weight.   For adults 20 years and older: ? A BMI below 18.5 is considered underweight. ? A BMI of 18.5 to 24.9 is normal. ? A BMI of 25 to 29.9 is considered overweight. ? A BMI of 30 and above is considered obese.   . Maintain normal blood lipids and cholesterol levels by exercising and minimizing your intake of saturated fat. Eat a balanced diet with plenty of fruit and vegetables. Blood tests for lipids and cholesterol should begin  at age 68 and be repeated every 5 years. If your lipid or cholesterol levels are high, you are over 50, or you are at high risk for heart disease, you may need your cholesterol levels checked more frequently. Ongoing high lipid and cholesterol levels should be treated with medicines if diet and exercise are not working.  . If you smoke, find out from your health care provider how to quit. If you do not use tobacco, please do not start.  . If you choose to drink alcohol, please do not consume more than 2 drinks per day. One drink is considered to  be 12 ounces (355 mL) of beer, 5 ounces (148 mL) of wine, or 1.5 ounces (44 mL) of liquor.  . If you are 35-24 years old, ask your health care provider if you should take aspirin to prevent strokes.  . Use sunscreen. Apply sunscreen liberally and repeatedly throughout the day. You should seek shade when your shadow is shorter than you. Protect yourself by wearing long sleeves, pants, a wide-brimmed hat, and sunglasses year round, whenever you are outdoors.  . Once a month, do a whole body skin exam, using a mirror to look at the skin on your back. Tell your health care provider of new moles, moles that have irregular borders, moles that are larger than a pencil eraser, or moles that have changed in shape or color.

## 2018-02-24 NOTE — Progress Notes (Signed)
PCP notes:   Health maintenance:  No gaps identified.  Abnormal screenings:   Fall risk - hx of single falls Fall Risk  02/24/2018 02/20/2017 10/25/2015 10/22/2014 10/16/2013  Falls in the past year? Yes No No Yes Yes  Comment fell after slipping in mud; brusing to elbow and knee - - - -  Number falls in past yr: 1 - - 1 1  Injury with Fall? Yes - - Yes No  Comment - - - - just bruised   Depression score:8 Depression screen [facility] 2/9 02/24/2018 02/20/2017 10/25/2015 10/22/2014 10/16/2013  Decreased Interest 1 1 0 0 0  Down, Depressed, Hopeless 1 1 0 0 0  PHQ - 2 Score 2 2 0 0 0  Altered sleeping 2 0 - - -  Tired, decreased energy 1 1 - - -  Change in appetite 1 0 - - -  Feeling bad or failure about yourself  1 1 - - -  Trouble concentrating 1 1 - - -  Moving slowly or fidgety/restless 0 0 - - -  Suicidal thoughts 0 0 - - -  PHQ-9 Score 8 5 - - -  Difficult doing work/chores Somewhat difficult Not difficult at all - - -    Patient concerns:   None  Nurse concerns:  None  Next PCP appt:   03/03/18 @ 1130   I reviewed health advisor's note, was available for consultation, and agree with documentation and plan. Loura Pardon MD

## 2018-02-24 NOTE — Progress Notes (Signed)
Subjective:   [name] [name] is a 62 y.o. female who presents for Medicare Annual (Subsequent) preventive examination.  Review of Systems:  N/A Cardiac Risk Factors include: advanced age (>39men, >59 women);obesity (BMI >30kg/m2);dyslipidemia     Objective:     Vitals: BP 116/70 (BP Location: Right Arm, Patient Position: Sitting, Cuff Size: Large)   Pulse 76   Temp 98.6 F (37 C) (Oral)   Ht 5' 0.5" (1.537 m) Comment: no shoes  Wt 222 lb 4 oz (100.8 kg)   SpO2 98%   BMI 42.69 kg/m   Body mass index is 42.69 kg/m.  Advanced Directives 02/24/2018 02/20/2017 03/26/2016 05/20/2015 04/22/2014  Does Patient Have a Medical Advance Directive? No Yes No No Patient does not have advance directive;Patient would like information  Type of Advance Directive - [place];Living will - - -  Copy of [place] in Chart? - No - copy requested - - -  Would patient like information on creating a medical advance directive? No - Patient declined - No - patient declined information - Advance directive packet given    Tobacco Social History   Tobacco Use  Smoking Status Never Smoker  Smokeless Tobacco Never Used     Counseling given: No   Clinical Intake:  Pre-visit preparation completed: Yes  Pain : No/denies pain Pain Score: 0-No pain     Nutritional Status: BMI > 30  Obese Nutritional Risks: None Diabetes: No  How often do you need to have someone help you when you read instructions, pamphlets, or other written materials from your doctor or pharmacy?: 1 - Never What is the last grade level you completed in school?: Bachelor   Interpreter Needed?: No  Comments: pt lives with spouse Information entered by :: LPinson, LPN  Past Medical History:  Diagnosis Date  . Allergic rhinitis   . Bipolar 1 disorder ([place])   . Fissure, anal   . GERD (gastroesophageal reflux disease)   . History of basal cell carcinoma excision    ON FACE  . History of  squamous cell carcinoma excision    ON RIGHT LEG  . Hyperlipidemia   . Hypothyroidism   . IBS (irritable bowel syndrome)   . Liver cyst   . Mallet finger of left hand    small finger   Past Surgical History:  Procedure Laterality Date  . CARDIAC CATHETERIZATION  04/1999   per pt normal  . CARDIOVASCULAR STRESS TEST  02-23-2011  dr Chrissie Noa f[facility] clinic)   normal nuclear study/  no ischemia/  ef 64%  . CATARACT EXTRACTION W/ INTRAOCULAR LENS  IMPLANT, BILATERAL  2003   Bil  . CESAREAN SECTION  1984  . CLOSED REDUCTION FINGER WITH PERCUTANEOUS PINNING Left 04/22/2014   Procedure: LEFT SMALL FINGER CLOSED REDUCTION PINNING;  Surgeon: Linna Hoff, MD;  Location: [facility];  Service: Orthopedics;  Laterality: Left;  . COLONOSCOPY  11/2002  . LAPAROSCOPIC CHOLECYSTECTOMY  03-18-2000  . TRANSTHORACIC ECHOCARDIOGRAM  02-23-2011   normal lvf/  ef 58%/  mild rve/   mild lae/  mild  mr  & tr/  mild pulmonary htn  . WISDOM TOOTH EXTRACTION     Family History  Problem Relation Age of Onset  . Stroke Mother   . Heart disease Mother   . Hypertension Father   . Diabetes Father   . Hyperlipidemia Father   . Diabetes Brother    Social History   Socioeconomic History  .  Marital status: Married    Spouse name: None  . Number of children: 1  . Years of education: None  . Highest education level: None  Social Needs  . Financial resource strain: None  . Food insecurity - worry: None  . Food insecurity - inability: None  . Transportation needs - medical: None  . Transportation needs - non-medical: None  Occupational History  . Occupation: Home    Employer: RETIRED  Tobacco Use  . Smoking status: Never Smoker  . Smokeless tobacco: Never Used  Substance and Sexual Activity  . Alcohol use: No    Alcohol/week: 0.0 oz  . Drug use: No  . Sexual activity: Not Currently    Birth control/protection: Post-menopausal  Other Topics Concern  . None  Social History  Narrative   Moved here from [place]    Outpatient Encounter Medications as of 02/24/2018  Medication Sig  . ARIPiprazole (ABILIFY) 30 MG tablet Take 30 mg by mouth daily.    Marland Kitchen aspirin 81 MG tablet Take 81 mg by mouth daily.    Marland Kitchen buPROPion (WELLBUTRIN XL) 150 MG 24 hr tablet Take two tablets by mouth every morning   . cetirizine (ZYRTEC) 10 MG tablet Take 10 mg by mouth daily.    . Cholecalciferol (VITAMIN D) 2000 UNITS CAPS Take 1 capsule by mouth daily.    . clonazePAM (KLONOPIN) 1 MG tablet Take 1 mg by mouth at bedtime as needed.    Marland Kitchen FLUoxetine (PROZAC) 20 MG capsule Take 20 mg by mouth daily.  . fluticasone (FLONASE) 50 MCG/ACT nasal spray Place 2 sprays daily into both nostrils.  Marland Kitchen gabapentin (NEURONTIN) 300 MG capsule Take 1 capsule (300 mg total) by mouth 3 (three) times daily.  . lansoprazole (PREVACID) 30 MG capsule TAKE 1 CAPSULE EVERY DAY  . levothyroxine (SYNTHROID, LEVOTHROID) 50 MCG tablet TAKE 1 TABLET EVERY DAY  . lovastatin (MEVACOR) 40 MG tablet TAKE 1 TABLET EVERY DAY  . Multiple Vitamin (MULTIVITAMIN) tablet Take 1 tablet by mouth daily.    Marland Kitchen OLANZapine (ZYPREXA) 2.5 MG tablet Take 2.5 mg by mouth at bedtime.  Marland Kitchen zolpidem (AMBIEN) 10 MG tablet Take 10 mg by mouth at bedtime.   . [DISCONTINUED] traMADol (ULTRAM) 50 MG tablet Take 0.5-1 tablets (25-50 mg total) by mouth every 8 (eight) hours as needed.  . [DISCONTINUED] valACYclovir (VALTREX) 1000 MG tablet Take 1 tablet (1,000 mg total) by mouth 3 (three) times daily.   No facility-administered encounter medications on file as of 02/24/2018.     Activities of Daily Living In your present state of health, do you have any difficulty performing the following activities: 02/24/2018  Hearing? Y  Vision? N  Difficulty concentrating or making decisions? N  Walking or climbing stairs? N  Dressing or bathing? N  Doing errands, shopping? N  Preparing Food and eating ? N  Using the Toilet? N  In the past six months, have you  accidently leaked urine? N  Do you have problems with loss of bowel control? N  Managing your Medications? N  Managing your Finances? N  Housekeeping or managing your Housekeeping? N  Some recent data might be hidden    Patient Care Team: Tower, Wynelle Fanny, MD as PCP - [place], OD as Consulting Physician (Optometry)    Assessment:   This is a routine wellness examination for Chariti.   Visual Acuity Screening   Right eye Left eye Both eyes  Without correction: 20/20-1 20/30-1 20/20  With correction:  Hearing Screening Comments: Bilateral hearing aids    Exercise Activities and Dietary recommendations Current Exercise Habits: Home exercise routine, Type of exercise: walking;Other - see comments;treadmill(stationary bike), Time (Minutes): 45, Frequency (Times/Week): 7, Weekly Exercise (Minutes/Week): 315, Intensity: Moderate, Exercise limited by: None identified  Goals    . Increase physical activity     Starting 02/24/2018, I will continue to exercise for at least 45 minutes daily.        Fall Risk Fall Risk  02/24/2018 02/20/2017 10/25/2015 10/22/2014 10/16/2013  Falls in the past year? Yes No No Yes Yes  Comment fell after slipping in mud; brusing to elbow and knee - - - -  Number falls in past yr: 1 - - 1 1  Injury with Fall? Yes - - Yes No  Comment - - - - just bruised   Depression Screen PHQ 2/9 Scores 02/24/2018 02/20/2017 10/25/2015 10/22/2014  PHQ - 2 Score 2 2 0 0  PHQ- 9 Score 8 5 - -     Cognitive Function MMSE - Mini Mental State Exam 02/24/2018 02/20/2017  Orientation to time 5 5  Orientation to Place 5 5  Registration 3 3  Attention/ Calculation 0 0  Recall 3 2  Recall-comments - pt was unable to recall 1 of 3 words  Language- name 2 objects 0 0  Language- repeat 1 1  Language- follow 3 step command 3 3  Language- read & follow direction 0 0  Write a sentence 0 0  Copy design 0 0  Total score 20 19       PLEASE NOTE: A Mini-Cog screen was  completed. Maximum score is 20. A value of 0 denotes this part of [place] MMSE was not completed or the patient failed this part of the Mini-Cog screening.   Mini-Cog Screening Orientation to Time - Max 5 pts Orientation to Place - Max 5 pts Registration - Max 3 pts Recall - Max 3 pts Language Repeat - Max 1 pts Language Follow 3 Step Command - Max 3 pts   Immunization History  Administered Date(s) Administered  . Influenza Split 09/16/2012  . Influenza Whole 10/01/2006, 09/14/2008, 09/07/2009, 09/04/2010, 08/21/2011  . Influenza, Seasonal, Injecte, Preservative Fre 09/13/2016  . Influenza,inj,Quad PF,6+ Mos 08/26/2017  . Influenza-Unspecified 09/08/2013, 08/10/2014, 08/25/2015  . Pneumococcal Polysaccharide-23 08/15/2006, 10/16/2013  . Td 12/10/1998, 09/30/2009  . Tdap 08/29/2017  . Zoster 04/13/2011    Screening Tests Health Maintenance  Topic Date Due  . HIV Screening  01/12/2024 (Originally 03/09/1971)  . MAMMOGRAM  12/26/2018  . PAP SMEAR  01/04/2020  . COLONOSCOPY  05/19/2025  . TETANUS/TDAP  08/30/2027  . INFLUENZA VACCINE  Completed  . Hepatitis C Screening  Completed       Plan:     I have personally reviewed, addressed, and noted the following in the patient's chart:  A. Medical and social history B. Use of alcohol, tobacco or illicit drugs  C. Current medications and supplements D. Functional ability and status E.  Nutritional status F.  Physical activity G. Advance directives H. List of other physicians I.  Hospitalizations, surgeries, and ER visits in previous 12 months J.  [place] to include hearing, vision, cognitive, depression L. Referrals and appointments - none  In addition, I have reviewed and discussed with patient certain preventive protocols, quality metrics, and best practice recommendations. A written personalized care plan for preventive services as well as general preventive health recommendations were provided to  patient.  See attached scanned  questionnaire for additional information.   Signed,   Lindell Noe, MHA, BS, LPN Health Coach

## 2018-02-25 ENCOUNTER — Ambulatory Visit: Payer: Medicare HMO | Admitting: Psychology

## 2018-02-25 DIAGNOSIS — F3132 Bipolar disorder, current episode depressed, moderate: Secondary | ICD-10-CM

## 2018-03-03 ENCOUNTER — Ambulatory Visit (INDEPENDENT_AMBULATORY_CARE_PROVIDER_SITE_OTHER): Payer: Medicare HMO | Admitting: Family Medicine

## 2018-03-03 ENCOUNTER — Encounter: Payer: Self-pay | Admitting: Family Medicine

## 2018-03-03 VITALS — BP 126/78 | HR 79 | Temp 98.0°F | Ht 60.5 in | Wt 221.5 lb

## 2018-03-03 DIAGNOSIS — E039 Hypothyroidism, unspecified: Secondary | ICD-10-CM

## 2018-03-03 DIAGNOSIS — E7800 Pure hypercholesterolemia, unspecified: Secondary | ICD-10-CM

## 2018-03-03 DIAGNOSIS — R7309 Other abnormal glucose: Secondary | ICD-10-CM

## 2018-03-03 DIAGNOSIS — Z0000 Encounter for general adult medical examination without abnormal findings: Secondary | ICD-10-CM

## 2018-03-03 DIAGNOSIS — R7303 Prediabetes: Secondary | ICD-10-CM | POA: Insufficient documentation

## 2018-03-03 DIAGNOSIS — F3177 Bipolar disorder, in partial remission, most recent episode mixed: Secondary | ICD-10-CM

## 2018-03-03 MED ORDER — LEVOTHYROXINE SODIUM 50 MCG PO TABS: ORAL_TABLET | ORAL | 3 refills | Status: DC

## 2018-03-03 MED ORDER — LANSOPRAZOLE 30 MG PO CPDR: DELAYED_RELEASE_CAPSULE | ORAL | 3 refills | Status: DC

## 2018-03-03 MED ORDER — LOVASTATIN 40 MG PO TABS: 40.0000 mg | ORAL_TABLET | Freq: Every day | ORAL | 3 refills | Status: DC

## 2018-03-03 NOTE — Assessment & Plan Note (Addendum)
Per pt ups and downs this year with stress bu overall stable Continues to see psychiatry and counseling  Stressed importance of good self care

## 2018-03-03 NOTE — Assessment & Plan Note (Addendum)
Reviewed health habits including diet and exercise and skin cancer prevention Reviewed appropriate screening tests for age  Also reviewed health mt list, fam hx and immunization status , as well as social and family history   See HPI Amw reviewed  Labs reviewed  Disc shingrix vaccine  Diet for hyperglycemia and wt loss

## 2018-03-03 NOTE — Assessment & Plan Note (Signed)
Hypothyroidism  Pt has no clinical changes No change in energy level/ hair or skin/ edema and no tremor Lab Results  Component Value Date   TSH 3.86 02/24/2018

## 2018-03-03 NOTE — Assessment & Plan Note (Addendum)
Disc goals for lipids and reasons to control them Rev labs with pt Rev low sat fat diet in detail Continue lovastatin  Stable reassuring EKG today in setting of fam hx of CAD and arrhythmia

## 2018-03-03 NOTE — Assessment & Plan Note (Signed)
111 fasting in obese female disc imp of low glycemic diet and wt loss to prevent DM2

## 2018-03-03 NOTE — Patient Instructions (Addendum)
If you are interested in the new shingles vaccine (Shingrix) - call your local pharmacy to check on coverage and availability (it will probably be 6 or more months)   To avoid diabetes  Try to get most of your carbohydrates from produce (with the exception of white potatoes)  Eat less bread/pasta/rice/snack foods/cereals/sweets and other items from the middle of the grocery store (processed carbs)  Also keep exercising     We will update your EKG today

## 2018-03-03 NOTE — Progress Notes (Signed)
Subjective:    Patient ID: [name] [name], female    DOB: [birth year], 62 y.o.   MRN: [id number]  HPI Here for health maintenance exam and to review chronic medical problems    [place]   Feeling ok overall  Recovering from shingles - still has some tingling /pain at night - gradually getting better    Wt Readings from Last 3 Encounters:  03/03/18 221 lb 8 oz (100.5 kg)  02/24/18 222 lb 4 oz (100.8 kg)  01/10/18 221 lb (100.2 kg)  weight is stable  Did not exercise when she had shingles  Now -walks 1 mi per day and 6 mi on bike 2-3 d per week and some treadmill  42.55 kg/m   amw was 3/18 Noted on fall after slipping in the mud (bruises) -thinks her balance is ok (was not paying attention) PHQ-2 is 2 and PHQ-9 is 8 Nl mini cog  BP Readings from Last 3 Encounters:  03/03/18 126/78  02/24/18 116/70  01/10/18 122/64   Pulse Readings from Last 3 Encounters:  03/03/18 79  02/24/18 76  01/10/18 60   Mammogram 1/19 neg Self breast exam -no lumps   Pap 1/18 neg with neg HPV test   Colonoscopy 6/16- 10 year recall   zostavax 5/12 Did get shingles outbreak in 2/19   Hypothyroidism  Pt has no clinical changes No change in energy level/ hair or skin/ edema and no tremor Lab Results  Component Value Date   TSH 3.86 02/24/2018     Sees psychiatry for bipolar disorder She sees Bambi Cottle and Dr Lajoyce Corners  Some ups and downs this year but thinks she is doing ok   Hyperlipidemia Lab Results  Component Value Date   CHOL 178 02/24/2018   CHOL 177 12/28/2016   CHOL 193 10/19/2015   Lab Results  Component Value Date   HDL 72.30 02/24/2018   HDL 63.10 12/28/2016   HDL 72 10/19/2015   Lab Results  Component Value Date   LDLCALC 85 02/24/2018   LDLCALC 82 12/28/2016   Bernie 90 10/19/2015   Lab Results  Component Value Date   TRIG 108.0 02/24/2018   TRIG 160.0 (H) 12/28/2016   TRIG 155 (H) 10/19/2015   Lab  Results  Component Value Date   CHOLHDL 2 02/24/2018   CHOLHDL 3 12/28/2016   CHOLHDL 2.7 10/19/2015   No results found for: LDLDIRECT mevacor and diet  Very well controlled- eating healthy and cut out fried foods and biscuits   fam hx of CAD Also a fib  Would like to have an EKG   Glucose was 111 fasting   Lab Results  Component Value Date   CREATININE 1.17 02/24/2018   BUN 16 02/24/2018   NA 141 02/24/2018   K 4.4 02/24/2018   CL 104 02/24/2018   CO2 28 02/24/2018    Lab Results  Component Value Date   ALT 19 02/24/2018   AST 21 02/24/2018   ALKPHOS 76 02/24/2018   BILITOT 0.5 02/24/2018    Lab Results  Component Value Date   WBC 4.8 02/24/2018   HGB 13.1 02/24/2018   HCT 39.0 02/24/2018   MCV 90.1 02/24/2018   PLT 193.0 02/24/2018    Patient Active Problem List   Diagnosis Date Noted  . Elevated glucose level 03/03/2018  . Shingles 01/10/2018  . Trochanteric bursitis 01/07/2018  . Low back pain 12/23/2017  . Thoracic back pain  12/23/2017  . Nipple discharge in female 10/15/2017  . Atrophic vaginitis 07/08/2017  . TMJ (temporomandibular joint disorder) 07/08/2017  . Cervical stenosis (uterine cervix) 01/03/2017  . Need for hepatitis C screening test 12/22/2016  . Left anterior knee pain 10/08/2016  . Left knee pain 07/24/2016  . Hearing loss 10/25/2015  . Routine general medical examination at a health care facility 10/17/2015  . Encounter for routine gynecological examination 10/22/2014  . Colon cancer screening 10/16/2013  . Encounter for Medicare annual wellness exam 10/04/2011  . Tinea corporis 09/05/2011  . METATARSALGIA 12/01/2009  . POSTMENOPAUSAL STATUS 09/30/2009  . ARTHRITIS, RIGHT HIP 03/10/2009  . DEGENERATIVE DISC DISEASE, LUMBAR SPINE 03/10/2009  . ONYCHOMYCOSIS 09/17/2008  . Hypothyroidism 08/05/2007  . Morbid obesity ([place]) 08/05/2007  . Bipolar disorder ([place]) 08/05/2007  . ALLERGIC RHINITIS 08/05/2007  . GERD 08/05/2007  . IBS  08/05/2007  . HYPERCHOLESTEROLEMIA, PURE 07/07/2007   Past Medical History:  Diagnosis Date  . Allergic rhinitis   . Bipolar 1 disorder ([place])   . Fissure, anal   . GERD (gastroesophageal reflux disease)   . History of basal cell carcinoma excision    ON FACE  . History of squamous cell carcinoma excision    ON RIGHT LEG  . Hyperlipidemia   . Hypothyroidism   . IBS (irritable bowel syndrome)   . Liver cyst   . Mallet finger of left hand    small finger   Past Surgical History:  Procedure Laterality Date  . CARDIAC CATHETERIZATION  04/1999   per pt normal  . CARDIOVASCULAR STRESS TEST  02-23-2011  dr Chrissie Noa f[facility] clinic)   normal nuclear study/  no ischemia/  ef 64%  . CATARACT EXTRACTION W/ INTRAOCULAR LENS  IMPLANT, BILATERAL  2003   Bil  . CESAREAN SECTION  1984  . CLOSED REDUCTION FINGER WITH PERCUTANEOUS PINNING Left 04/22/2014   Procedure: LEFT SMALL FINGER CLOSED REDUCTION PINNING;  Surgeon: Linna Hoff, MD;  Location: [facility];  Service: Orthopedics;  Laterality: Left;  . COLONOSCOPY  11/2002  . LAPAROSCOPIC CHOLECYSTECTOMY  03-18-2000  . TRANSTHORACIC ECHOCARDIOGRAM  02-23-2011   normal lvf/  ef 58%/  mild rve/   mild lae/  mild  mr  & tr/  mild pulmonary htn  . WISDOM TOOTH EXTRACTION     Social History   Tobacco Use  . Smoking status: Never Smoker  . Smokeless tobacco: Never Used  Substance Use Topics  . Alcohol use: No    Alcohol/week: 0.0 oz  . Drug use: No   Family History  Problem Relation Age of Onset  . Stroke Mother   . Heart disease Mother   . Hypertension Father   . Diabetes Father   . Hyperlipidemia Father   . Diabetes Brother    Allergies  Allergen Reactions  . Xylocaine [Lidocaine Hcl] Swelling    ALLERGIC TO ALL CAINES EXCEPT SENSORCAINE , AND MARCAINE  . Codeine Nausea And Vomiting    REACTION: vomits blood  . Lamictal [Lamotrigine] Other (See Comments)    REACTION: sores in mouth, muscle aches and  also made her manic bipolar  . Lithium Swelling  . Tegretol [Carbamazepine] Other (See Comments)    REACTION: skin crawls   Current Outpatient Medications on File Prior to Visit  Medication Sig Dispense Refill  . ARIPiprazole (ABILIFY) 30 MG tablet Take 30 mg by mouth daily.      Marland Kitchen aspirin 81 MG tablet Take 81 mg by mouth daily.      Marland Kitchen  buPROPion (WELLBUTRIN XL) 150 MG 24 hr tablet Take two tablets by mouth every morning     . cetirizine (ZYRTEC) 10 MG tablet Take 10 mg by mouth daily.      . Cholecalciferol (VITAMIN D) 2000 UNITS CAPS Take 1 capsule by mouth daily.      . clonazePAM (KLONOPIN) 1 MG tablet Take 1 mg by mouth at bedtime as needed.      Marland Kitchen FLUoxetine (PROZAC) 20 MG capsule Take 20 mg by mouth daily.    . fluticasone (FLONASE) 50 MCG/ACT nasal spray Place 2 sprays daily into both nostrils. 48 g 0  . gabapentin (NEURONTIN) 300 MG capsule Take 1 capsule (300 mg total) by mouth 3 (three) times daily. 90 capsule 3  . Multiple Vitamin (MULTIVITAMIN) tablet Take 1 tablet by mouth daily.      Marland Kitchen OLANZapine (ZYPREXA) 2.5 MG tablet Take 2.5 mg by mouth at bedtime.    Marland Kitchen zolpidem (AMBIEN) 10 MG tablet Take 10 mg by mouth at bedtime.      No current facility-administered medications on file prior to visit.     Review of Systems     Objective:   Physical Exam        Assessment & Plan:

## 2018-03-03 NOTE — Assessment & Plan Note (Signed)
Discussed how this problem influences overall health and the risks it imposes  Reviewed plan for weight loss with lower calorie diet (via better food choices and also portion control or program like weight watchers) and exercise building up to or more than 30 minutes 5 days per week including some aerobic activity    

## 2018-03-11 ENCOUNTER — Ambulatory Visit: Payer: Medicare HMO | Admitting: Psychology

## 2018-03-11 DIAGNOSIS — F3132 Bipolar disorder, current episode depressed, moderate: Secondary | ICD-10-CM | POA: Diagnosis not present

## 2018-03-25 ENCOUNTER — Ambulatory Visit: Payer: Medicare HMO | Admitting: Psychology

## 2018-04-08 ENCOUNTER — Ambulatory Visit: Payer: Medicare HMO | Admitting: Psychology

## 2018-04-08 DIAGNOSIS — F3132 Bipolar disorder, current episode depressed, moderate: Secondary | ICD-10-CM

## 2018-04-22 ENCOUNTER — Ambulatory Visit: Payer: Medicare HMO | Admitting: Psychology

## 2018-04-22 DIAGNOSIS — F3132 Bipolar disorder, current episode depressed, moderate: Secondary | ICD-10-CM

## 2018-05-06 ENCOUNTER — Ambulatory Visit: Payer: Medicare HMO | Admitting: Psychology

## 2018-05-06 DIAGNOSIS — F3132 Bipolar disorder, current episode depressed, moderate: Secondary | ICD-10-CM | POA: Diagnosis not present

## 2018-05-18 ENCOUNTER — Encounter: Payer: Self-pay | Admitting: Family Medicine

## 2018-05-19 ENCOUNTER — Ambulatory Visit (INDEPENDENT_AMBULATORY_CARE_PROVIDER_SITE_OTHER): Payer: Medicare HMO | Admitting: Primary Care

## 2018-05-19 ENCOUNTER — Encounter: Payer: Self-pay | Admitting: Primary Care

## 2018-05-19 VITALS — BP 130/78 | HR 77 | Temp 98.1°F | Ht 60.5 in | Wt 221.0 lb

## 2018-05-19 DIAGNOSIS — R21 Rash and other nonspecific skin eruption: Secondary | ICD-10-CM | POA: Diagnosis not present

## 2018-05-19 MED ORDER — METHYLPREDNISOLONE ACETATE 40 MG/ML IJ SUSP
80.0000 mg | Freq: Once | INTRAMUSCULAR | Status: AC
  Administered 2018-05-19: 80 mg via INTRAMUSCULAR

## 2018-05-19 NOTE — Addendum Note (Signed)
Addended by: Jacqualin Combes on: 05/19/2018 09:14 AM   Modules accepted: Orders

## 2018-05-19 NOTE — Patient Instructions (Signed)
Start Zyrtec once daily. Start Pepcid or Zantac once daily.  You were provided with an injection of steroids today.  Please notify me if no improvement in 2-3 days as discussed.   It was a pleasure to see you today!

## 2018-05-19 NOTE — Progress Notes (Signed)
Subjective:    Patient ID: [name] [name], female    DOB: 02-27-1956, 62 y.o.   MRN: [id number]  HPI  [name] [name] is a 62 year old female who presents today with a chief complaint of rash.   Her rash is located to her bilateral axilla which she noticed yesterday. She put on a new unlaundered shirt Saturday afternoon, wore for about 15 minutes then changed into an older shirt. She denies being outdoors for prolonged periods of time, changes in foods/medications/detergents, sweating.    She's applied hydrocortisone cream without improvement. She did take Benadryl yesterday and last night and today with some improvement.  Review of Systems  HENT: Negative for sore throat and trouble swallowing.   Respiratory: Negative for shortness of breath and wheezing.   Skin: Positive for color change and rash.       Past Medical History:  Diagnosis Date  . Allergic rhinitis   . Bipolar 1 disorder ([place])   . Fissure, anal   . GERD (gastroesophageal reflux disease)   . History of basal cell carcinoma excision    ON FACE  . History of squamous cell carcinoma excision    ON RIGHT LEG  . Hyperlipidemia   . Hypothyroidism   . IBS (irritable bowel syndrome)   . Liver cyst   . Mallet finger of left hand    small finger     Social History   Socioeconomic History  . Marital status: Married    Spouse name: Not on file  . Number of children: 1  . Years of education: Not on file  . Highest education level: Not on file  Occupational History  . Occupation: Home    Employer: RETIRED  Social Needs  . Financial resource strain: Not on file  . Food insecurity:    Worry: Not on file    Inability: Not on file  . Transportation needs:    Medical: Not on file    Non-medical: Not on file  Tobacco Use  . Smoking status: Never Smoker  . Smokeless tobacco: Never Used  Substance and Sexual Activity  . Alcohol use: No    Alcohol/week: 0.0 oz  . Drug use: No  . Sexual activity: Not Currently   Birth control/protection: Post-menopausal  Lifestyle  . Physical activity:    Days per week: Not on file    Minutes per session: Not on file  . Stress: Not on file  Relationships  . Social connections:    Talks on phone: Not on file    Gets together: Not on file    Attends religious service: Not on file    Active member of club or organization: Not on file    Attends meetings of clubs or organizations: Not on file    Relationship status: Not on file  . Intimate partner violence:    Fear of current or ex partner: Not on file    Emotionally abused: Not on file    Physically abused: Not on file    Forced sexual activity: Not on file  Other Topics Concern  . Not on file  Social History Narrative   Moved here from [place]    Past Surgical History:  Procedure Laterality Date  . CARDIAC CATHETERIZATION  04/1999   per pt normal  . CARDIOVASCULAR STRESS TEST  02-23-2011  dr Chrissie Noa fath [facility] clinic)   normal nuclear study/  no ischemia/  ef 64%  . CATARACT EXTRACTION W/ INTRAOCULAR LENS  IMPLANT, BILATERAL  2003   Bil  . CESAREAN SECTION  1984  . CLOSED REDUCTION FINGER WITH PERCUTANEOUS PINNING Left 04/22/2014   Procedure: LEFT SMALL FINGER CLOSED REDUCTION PINNING;  Surgeon: Linna Hoff, MD;  Location: [place];  Service: Orthopedics;  Laterality: Left;  . COLONOSCOPY  11/2002  . LAPAROSCOPIC CHOLECYSTECTOMY  03-18-2000  . TRANSTHORACIC ECHOCARDIOGRAM  02-23-2011   normal lvf/  ef 58%/  mild rve/   mild lae/  mild  mr  & tr/  mild pulmonary htn  . WISDOM TOOTH EXTRACTION      Family History  Problem Relation Age of Onset  . Stroke Mother   . Heart disease Mother   . Hypertension Father   . Diabetes Father   . Hyperlipidemia Father   . Diabetes Brother     Allergies  Allergen Reactions  . Xylocaine [Lidocaine Hcl] Swelling    ALLERGIC TO ALL CAINES EXCEPT SENSORCAINE , AND MARCAINE  . Codeine Nausea And Vomiting    REACTION: vomits blood  .  Lamictal [Lamotrigine] Other (See Comments)    REACTION: sores in mouth, muscle aches and also made her manic bipolar  . Lithium Swelling  . Tegretol [Carbamazepine] Other (See Comments)    REACTION: skin crawls    Current Outpatient Medications on File Prior to Visit  Medication Sig Dispense Refill  . ARIPiprazole (ABILIFY) 30 MG tablet Take 30 mg by mouth daily.      Marland Kitchen aspirin 81 MG tablet Take 81 mg by mouth daily.      Marland Kitchen buPROPion (WELLBUTRIN XL) 150 MG 24 hr tablet Take two tablets by mouth every morning     . Cholecalciferol (VITAMIN D) 2000 UNITS CAPS Take 1 capsule by mouth daily.      . clonazePAM (KLONOPIN) 1 MG tablet Take 1 mg by mouth at bedtime as needed.      Marland Kitchen FLUoxetine (PROZAC) 20 MG capsule Take 20 mg by mouth daily.    . fluticasone (FLONASE) 50 MCG/ACT nasal spray Place 2 sprays daily into both nostrils. 48 g 0  . lansoprazole (PREVACID) 30 MG capsule TAKE 1 CAPSULE EVERY DAY 90 capsule 3  . levothyroxine (SYNTHROID, LEVOTHROID) 50 MCG tablet TAKE 1 TABLET EVERY DAY 90 tablet 3  . lovastatin (MEVACOR) 40 MG tablet Take 1 tablet (40 mg total) by mouth daily. 90 tablet 3  . Multiple Vitamin (MULTIVITAMIN) tablet Take 1 tablet by mouth daily.      Marland Kitchen OLANZapine (ZYPREXA) 2.5 MG tablet Take 2.5 mg by mouth at bedtime.    Marland Kitchen zolpidem (AMBIEN) 10 MG tablet Take 10 mg by mouth at bedtime.     . cetirizine (ZYRTEC) 10 MG tablet Take 10 mg by mouth daily.       No current facility-administered medications on file prior to visit.     BP 130/78   Pulse 77   Temp 98.1 F (36.7 C) (Oral)   Ht 5' 0.5" (1.537 m)   Wt 221 lb (100.2 kg)   SpO2 97%   BMI 42.45 kg/m    Objective:   Physical Exam  Constitutional: She appears well-nourished.  Cardiovascular: Normal rate and regular rhythm.  Respiratory: Effort normal and breath sounds normal.  Skin: Skin is warm and dry. Rash noted.  Hives under bilateral axilla with moderate erythema. Skin intact.              Assessment & Plan:  Rash:  Rash appears to be allergic reaction, likely to new unlaundered shirt.  Exam with hives to axilla bilaterally, no systemic symptoms. IM Depo Medrol 80 mg provided today. Discussed to start Zyrtec daily. Can use hydrocortisone cream PRN. She will wash the new shirt before wearing again. She will update if no improvement in symptoms.  Pleas Koch, NP

## 2018-05-20 ENCOUNTER — Telehealth: Payer: Self-pay | Admitting: Family Medicine

## 2018-05-20 ENCOUNTER — Ambulatory Visit: Payer: Medicare HMO | Admitting: Psychology

## 2018-05-20 DIAGNOSIS — R21 Rash and other nonspecific skin eruption: Secondary | ICD-10-CM

## 2018-05-20 DIAGNOSIS — F3132 Bipolar disorder, current episode depressed, moderate: Secondary | ICD-10-CM | POA: Diagnosis not present

## 2018-05-20 MED ORDER — PREDNISONE 20 MG PO TABS: ORAL_TABLET | ORAL | 0 refills | Status: DC

## 2018-05-20 NOTE — Telephone Encounter (Signed)
Spoken and notified patient of Kate Clark's comments. Patient verbalized understanding.  

## 2018-05-20 NOTE — Telephone Encounter (Signed)
Please notify patient that the prescription was sent to her pharmacy. Take 2 tablets for 4 days, then 1 tablet for 4 days.

## 2018-05-20 NOTE — Telephone Encounter (Signed)
Pt is wondering if she could get a prescription for prednisone tablets. She's not any better. Rash is spreading and she's leaving to go out of town tomorrow.

## 2018-06-03 ENCOUNTER — Ambulatory Visit: Payer: Medicare HMO | Admitting: Psychology

## 2018-06-17 ENCOUNTER — Ambulatory Visit: Payer: Medicare HMO | Admitting: Psychology

## 2018-06-17 DIAGNOSIS — F3132 Bipolar disorder, current episode depressed, moderate: Secondary | ICD-10-CM | POA: Diagnosis not present

## 2018-07-01 ENCOUNTER — Ambulatory Visit: Payer: Medicare HMO | Admitting: Psychology

## 2018-07-01 DIAGNOSIS — F3132 Bipolar disorder, current episode depressed, moderate: Secondary | ICD-10-CM | POA: Diagnosis not present

## 2018-07-22 DIAGNOSIS — L821 Other seborrheic keratosis: Secondary | ICD-10-CM | POA: Diagnosis not present

## 2018-07-22 DIAGNOSIS — L578 Other skin changes due to chronic exposure to nonionizing radiation: Secondary | ICD-10-CM | POA: Diagnosis not present

## 2018-07-22 DIAGNOSIS — B078 Other viral warts: Secondary | ICD-10-CM | POA: Diagnosis not present

## 2018-07-22 DIAGNOSIS — D485 Neoplasm of uncertain behavior of skin: Secondary | ICD-10-CM | POA: Diagnosis not present

## 2018-07-22 DIAGNOSIS — L812 Freckles: Secondary | ICD-10-CM | POA: Diagnosis not present

## 2018-07-22 DIAGNOSIS — Z1283 Encounter for screening for malignant neoplasm of skin: Secondary | ICD-10-CM | POA: Diagnosis not present

## 2018-07-22 DIAGNOSIS — L820 Inflamed seborrheic keratosis: Secondary | ICD-10-CM | POA: Diagnosis not present

## 2018-07-22 DIAGNOSIS — D229 Melanocytic nevi, unspecified: Secondary | ICD-10-CM | POA: Diagnosis not present

## 2018-07-22 DIAGNOSIS — L738 Other specified follicular disorders: Secondary | ICD-10-CM | POA: Diagnosis not present

## 2018-07-22 DIAGNOSIS — D2261 Melanocytic nevi of right upper limb, including shoulder: Secondary | ICD-10-CM | POA: Diagnosis not present

## 2018-07-22 DIAGNOSIS — Z85828 Personal history of other malignant neoplasm of skin: Secondary | ICD-10-CM | POA: Diagnosis not present

## 2018-08-12 ENCOUNTER — Ambulatory Visit: Payer: Medicare HMO | Admitting: Psychology

## 2018-08-25 ENCOUNTER — Encounter: Payer: Self-pay | Admitting: Family Medicine

## 2018-08-25 ENCOUNTER — Ambulatory Visit (INDEPENDENT_AMBULATORY_CARE_PROVIDER_SITE_OTHER): Payer: Medicare HMO | Admitting: Family Medicine

## 2018-08-25 VITALS — BP 126/76 | HR 75 | Temp 98.2°F | Ht 60.5 in | Wt 222.5 lb

## 2018-08-25 DIAGNOSIS — N6323 Unspecified lump in the left breast, lower outer quadrant: Secondary | ICD-10-CM | POA: Insufficient documentation

## 2018-08-25 DIAGNOSIS — Z23 Encounter for immunization: Secondary | ICD-10-CM | POA: Diagnosis not present

## 2018-08-25 NOTE — Progress Notes (Signed)
Subjective:    Patient ID: [name] [name], female    DOB: Aug 25, 1956, 62 y.o.   MRN: [id number]  HPI Here for a lump in L breast   Noticed a small lump in shower today  BB size On nipple No pain     Mammogram 1/19  MM SCREENING BREAST TOMO BILATERAL (Accession [id number]) (Order [id number])  Imaging  Date: 12/26/2017 Department: [facility] Released By: Colin Mulders Authorizing: Rocquel Askren, Wynelle Fanny, MD  Exam Information   Status Exam Begun  Exam Ended   Final [99] 12/26/2017 2:35 PM 12/26/2017 2:47 PM  Study Result   CLINICAL DATA:  Screening.  EXAM: 2D DIGITAL SCREENING BILATERAL MAMMOGRAM WITH 3D TOMO WITH CAD  COMPARISON:  Previous exam(s).  ACR Breast Density Category b: There are scattered areas of fibroglandular density.  FINDINGS: There are no findings suspicious for malignancy. Images were processed with CAD.  IMPRESSION: No mammographic evidence of malignancy. A result letter of this screening mammogram will be mailed directly to the patient.  RECOMMENDATION: Screening mammogram in one year. (Code:SM-B-01Y)  BI-RADS CATEGORY  1: Negative.   Electronically Signed   By: Margarette Canada M.D.   On: 12/26/2017 15:44     No fam hx of breast cancer  Patient Active Problem List   Diagnosis Date Noted  . Breast lump on left side at 5 o'clock position 08/25/2018  . Elevated glucose level 03/03/2018  . Shingles 01/10/2018  . Trochanteric bursitis 01/07/2018  . Low back pain 12/23/2017  . Thoracic back pain 12/23/2017  . Atrophic vaginitis 07/08/2017  . TMJ (temporomandibular joint disorder) 07/08/2017  . Cervical stenosis (uterine cervix) 01/03/2017  . Left anterior knee pain 10/08/2016  . Left knee pain 07/24/2016  . Hearing loss 10/25/2015  . Routine general medical examination at a health care facility 10/17/2015  . Encounter for routine gynecological examination 10/22/2014  . Colon cancer screening 10/16/2013  .  Encounter for Medicare annual wellness exam 10/04/2011  . METATARSALGIA 12/01/2009  . POSTMENOPAUSAL STATUS 09/30/2009  . ARTHRITIS, RIGHT HIP 03/10/2009  . DEGENERATIVE DISC DISEASE, LUMBAR SPINE 03/10/2009  . ONYCHOMYCOSIS 09/17/2008  . Hypothyroidism 08/05/2007  . Morbid obesity ([place]) 08/05/2007  . Bipolar disorder ([place]) 08/05/2007  . ALLERGIC RHINITIS 08/05/2007  . GERD 08/05/2007  . IBS 08/05/2007  . HYPERCHOLESTEROLEMIA, PURE 07/07/2007   Past Medical History:  Diagnosis Date  . Allergic rhinitis   . Bipolar 1 disorder ([place])   . Fissure, anal   . GERD (gastroesophageal reflux disease)   . History of basal cell carcinoma excision    ON FACE  . History of squamous cell carcinoma excision    ON RIGHT LEG  . Hyperlipidemia   . Hypothyroidism   . IBS (irritable bowel syndrome)   . Liver cyst   . Mallet finger of left hand    small finger   Past Surgical History:  Procedure Laterality Date  . CARDIAC CATHETERIZATION  04/1999   per pt normal  . CARDIOVASCULAR STRESS TEST  02-23-2011  dr Chrissie Noa fath [facility] clinic)   normal nuclear study/  no ischemia/  ef 64%  . CATARACT EXTRACTION W/ INTRAOCULAR LENS  IMPLANT, BILATERAL  2003   Bil  . CESAREAN SECTION  1984  . CLOSED REDUCTION FINGER WITH PERCUTANEOUS PINNING Left 04/22/2014   Procedure: LEFT SMALL FINGER CLOSED REDUCTION PINNING;  Surgeon: Linna Hoff, MD;  Location: [place];  Service: Orthopedics;  Laterality: Left;  . COLONOSCOPY  11/2002  .  LAPAROSCOPIC CHOLECYSTECTOMY  03-18-2000  . TRANSTHORACIC ECHOCARDIOGRAM  02-23-2011   normal lvf/  ef 58%/  mild rve/   mild lae/  mild  mr  & tr/  mild pulmonary htn  . WISDOM TOOTH EXTRACTION     Social History   Tobacco Use  . Smoking status: Never Smoker  . Smokeless tobacco: Never Used  Substance Use Topics  . Alcohol use: No    Alcohol/week: 0.0 standard drinks  . Drug use: No   Family History  Problem Relation Age of Onset  . Stroke  Mother   . Heart disease Mother   . Hypertension Father   . Diabetes Father   . Hyperlipidemia Father   . Diabetes Brother    Allergies  Allergen Reactions  . Xylocaine [Lidocaine Hcl] Swelling    ALLERGIC TO ALL CAINES EXCEPT SENSORCAINE , AND MARCAINE  . Codeine Nausea And Vomiting    REACTION: vomits blood  . Lamictal [Lamotrigine] Other (See Comments)    REACTION: sores in mouth, muscle aches and also made her manic bipolar  . Lithium Swelling  . Tegretol [Carbamazepine] Other (See Comments)    REACTION: skin crawls   Current Outpatient Medications on File Prior to Visit  Medication Sig Dispense Refill  . ARIPiprazole (ABILIFY) 30 MG tablet Take 30 mg by mouth daily.      Marland Kitchen aspirin 81 MG tablet Take 81 mg by mouth daily.      Marland Kitchen buPROPion (WELLBUTRIN XL) 150 MG 24 hr tablet Take two tablets by mouth every morning     . Cholecalciferol (VITAMIN D) 2000 UNITS CAPS Take 1 capsule by mouth daily.      . clonazePAM (KLONOPIN) 1 MG tablet Take 1 mg by mouth at bedtime as needed.      . fluticasone (FLONASE) 50 MCG/ACT nasal spray Place 2 sprays daily into both nostrils. 48 g 0  . lansoprazole (PREVACID) 30 MG capsule TAKE 1 CAPSULE EVERY DAY 90 capsule 3  . levothyroxine (SYNTHROID, LEVOTHROID) 50 MCG tablet TAKE 1 TABLET EVERY DAY 90 tablet 3  . lovastatin (MEVACOR) 40 MG tablet Take 1 tablet (40 mg total) by mouth daily. 90 tablet 3  . Multiple Vitamin (MULTIVITAMIN) tablet Take 1 tablet by mouth daily.      Marland Kitchen tiZANidine (ZANAFLEX) 4 MG tablet Take 1 tablet by mouth at bedtime as needed.  0  . zolpidem (AMBIEN) 10 MG tablet Take 10 mg by mouth at bedtime.      No current facility-administered medications on file prior to visit.      Review of Systems  Constitutional: Negative for activity change, appetite change, fatigue, fever and unexpected weight change.  HENT: Negative for congestion, ear pain, rhinorrhea, sinus pressure and sore throat.   Eyes: Negative for pain, redness  and visual disturbance.  Respiratory: Negative for cough, shortness of breath and wheezing.   Cardiovascular: Negative for chest pain and palpitations.  Gastrointestinal: Negative for abdominal pain, blood in stool, constipation and diarrhea.  Endocrine: Negative for polydipsia and polyuria.  Genitourinary: Negative for dysuria, frequency and urgency.       Small breast lump under nipple on L  Musculoskeletal: Negative for arthralgias, back pain and myalgias.  Skin: Negative for pallor and rash.  Allergic/Immunologic: Negative for environmental allergies.  Neurological: Negative for dizziness, syncope and headaches.  Hematological: Negative for adenopathy. Does not bruise/bleed easily.  Psychiatric/Behavioral: Negative for decreased concentration and dysphoric mood. The patient is not nervous/anxious.  Objective:   Physical Exam  Constitutional: She is oriented to person, place, and time. She appears well-developed and well-nourished. No distress.  obese and well appearing   Eyes: Pupils are equal, round, and reactive to light. Conjunctivae and EOM are normal.  Neck: Normal range of motion. Neck supple.  Cardiovascular: Normal rate, regular rhythm and normal heart sounds.  Genitourinary:  Genitourinary Comments: L breast- small (BB sized) mobile lump at 5:00 under nipple nontender   Otherwise nl exam  No skin change or nipple d/c  Lymphadenopathy:    She has no cervical adenopathy.  Neurological: She is alert and oriented to person, place, and time. No cranial nerve deficit.  Skin: No rash noted. No erythema. No pallor.  Psychiatric: She has a normal mood and affect.          Assessment & Plan:   Problem List Items Addressed This Visit      Other   Breast lump on left side at 5 o'clock position - Primary    Very tiny under nipple- size of BB and mobile /nt  Will order diag mm and [place]  Suspect this may be milk duct       Relevant Orders   US BREAST LTD UNI LEFT  INC AXILLA   MM DIAG BREAST TOMO UNI LEFT    Other Visit Diagnoses    Need for influenza vaccination       Relevant Orders   Flu Vaccine QUAD 6+ mos PF IM (Fluarix Quad PF) (Completed)

## 2018-08-25 NOTE — Assessment & Plan Note (Signed)
Very tiny under nipple- size of BB and mobile /nt  Will order diag mm and [place]  Suspect this may be milk duct

## 2018-08-25 NOTE — Patient Instructions (Signed)
I think your lump may be a clogged or calcified milk duct based on position and exam   We will order a mammogram and ultrasound to confirm this  Stop up front for the appointment

## 2018-08-26 ENCOUNTER — Ambulatory Visit: Payer: Medicare HMO | Admitting: Psychology

## 2018-08-26 DIAGNOSIS — F3132 Bipolar disorder, current episode depressed, moderate: Secondary | ICD-10-CM

## 2018-08-28 ENCOUNTER — Ambulatory Visit
Admission: RE | Admit: 2018-08-28 | Discharge: 2018-08-28 | Disposition: A | Discharge status: Home / Self Care | Payer: Medicare HMO | Source: Ambulatory Visit | Attending: Family Medicine | Admitting: Family Medicine

## 2018-08-28 DIAGNOSIS — N6323 Unspecified lump in the left breast, lower outer quadrant: Secondary | ICD-10-CM

## 2018-08-28 DIAGNOSIS — R928 Other abnormal and inconclusive findings on diagnostic imaging of breast: Secondary | ICD-10-CM | POA: Diagnosis not present

## 2018-09-02 ENCOUNTER — Encounter: Payer: Self-pay | Admitting: Primary Care

## 2018-09-02 ENCOUNTER — Ambulatory Visit (INDEPENDENT_AMBULATORY_CARE_PROVIDER_SITE_OTHER): Payer: Medicare HMO | Admitting: Primary Care

## 2018-09-02 VITALS — BP 124/78 | HR 79 | Temp 98.2°F | Ht 60.5 in | Wt 222.8 lb

## 2018-09-02 DIAGNOSIS — R238 Other skin changes: Secondary | ICD-10-CM

## 2018-09-02 NOTE — Patient Instructions (Signed)
Keep the area clean and dry.  Use a panty liner to prevent excess moisure getting to the area.   Please call us if you notice increased drainage that changes color to white/ green, fevers, pain, surrounding redness.  It was a pleasure to see you today!

## 2018-09-02 NOTE — Progress Notes (Signed)
Subjective:    Patient ID: [name] [name], female    DOB: 1955-12-28, 62 y.o.   MRN: [id number]  HPI  [name] [name] is a 62 year old female who presents today with a chief complaint of skin irritation.   She describes the irritation as a boil or pimple for which she noticed two days ago. The spot has since popped yesterday and has noticed a pinkish drainage. She denies fevers, pain.   She is worried as her brother was admitted to the hospital for 4 weeks due to an abscess.   Review of Systems  Constitutional: Negative for fever.  Skin:       Pimple to buttocks       Past Medical History:  Diagnosis Date  . Allergic rhinitis   . Bipolar 1 disorder ([place])   . Fissure, anal   . GERD (gastroesophageal reflux disease)   . History of basal cell carcinoma excision    ON FACE  . History of squamous cell carcinoma excision    ON RIGHT LEG  . Hyperlipidemia   . Hypothyroidism   . IBS (irritable bowel syndrome)   . Liver cyst   . Mallet finger of left hand    small finger     Social History   Socioeconomic History  . Marital status: Married    Spouse name: Not on file  . Number of children: 1  . Years of education: Not on file  . Highest education level: Not on file  Occupational History  . Occupation: Home    Employer: RETIRED  Social Needs  . Financial resource strain: Not on file  . Food insecurity:    Worry: Not on file    Inability: Not on file  . Transportation needs:    Medical: Not on file    Non-medical: Not on file  Tobacco Use  . Smoking status: Never Smoker  . Smokeless tobacco: Never Used  Substance and Sexual Activity  . Alcohol use: No    Alcohol/week: 0.0 standard drinks  . Drug use: No  . Sexual activity: Not Currently    Birth control/protection: Post-menopausal  Lifestyle  . Physical activity:    Days per week: Not on file    Minutes per session: Not on file  . Stress: Not on file  Relationships  . Social connections:    Talks on phone:  Not on file    Gets together: Not on file    Attends religious service: Not on file    Active member of club or organization: Not on file    Attends meetings of clubs or organizations: Not on file    Relationship status: Not on file  . Intimate partner violence:    Fear of current or ex partner: Not on file    Emotionally abused: Not on file    Physically abused: Not on file    Forced sexual activity: Not on file  Other Topics Concern  . Not on file  Social History Narrative   Moved here from [place]    Past Surgical History:  Procedure Laterality Date  . CARDIAC CATHETERIZATION  04/1999   per pt normal  . CARDIOVASCULAR STRESS TEST  02-23-2011  dr Chrissie Noa fath [facility] clinic)   normal nuclear study/  no ischemia/  ef 64%  . CATARACT EXTRACTION W/ INTRAOCULAR LENS  IMPLANT, BILATERAL  2003   Bil  . CESAREAN SECTION  1984  . CLOSED REDUCTION FINGER WITH PERCUTANEOUS PINNING Left 04/22/2014  Procedure: LEFT SMALL FINGER CLOSED REDUCTION PINNING;  Surgeon: Linna Hoff, MD;  Location: [facility];  Service: Orthopedics;  Laterality: Left;  . COLONOSCOPY  11/2002  . LAPAROSCOPIC CHOLECYSTECTOMY  03-18-2000  . TRANSTHORACIC ECHOCARDIOGRAM  02-23-2011   normal lvf/  ef 58%/  mild rve/   mild lae/  mild  mr  & tr/  mild pulmonary htn  . WISDOM TOOTH EXTRACTION      Family History  Problem Relation Age of Onset  . Stroke Mother   . Heart disease Mother   . Hypertension Father   . Diabetes Father   . Hyperlipidemia Father   . Diabetes Brother     Allergies  Allergen Reactions  . Xylocaine [Lidocaine Hcl] Swelling    ALLERGIC TO ALL CAINES EXCEPT SENSORCAINE , AND MARCAINE  . Codeine Nausea And Vomiting    REACTION: vomits blood  . Lamictal [Lamotrigine] Other (See Comments)    REACTION: sores in mouth, muscle aches and also made her manic bipolar  . Lithium Swelling  . Tegretol [Carbamazepine] Other (See Comments)    REACTION: skin crawls    Current  Outpatient Medications on File Prior to Visit  Medication Sig Dispense Refill  . ARIPiprazole (ABILIFY) 30 MG tablet Take 30 mg by mouth daily.      Marland Kitchen aspirin 81 MG tablet Take 81 mg by mouth daily.      Marland Kitchen buPROPion (WELLBUTRIN XL) 150 MG 24 hr tablet Take two tablets by mouth every morning     . Cholecalciferol (VITAMIN D) 2000 UNITS CAPS Take 1 capsule by mouth daily.      . clonazePAM (KLONOPIN) 1 MG tablet Take 1 mg by mouth at bedtime as needed.      . fluticasone (FLONASE) 50 MCG/ACT nasal spray Place 2 sprays daily into both nostrils. 48 g 0  . lansoprazole (PREVACID) 30 MG capsule TAKE 1 CAPSULE EVERY DAY 90 capsule 3  . levothyroxine (SYNTHROID, LEVOTHROID) 50 MCG tablet TAKE 1 TABLET EVERY DAY 90 tablet 3  . lovastatin (MEVACOR) 40 MG tablet Take 1 tablet (40 mg total) by mouth daily. 90 tablet 3  . Multiple Vitamin (MULTIVITAMIN) tablet Take 1 tablet by mouth daily.      Marland Kitchen tiZANidine (ZANAFLEX) 4 MG tablet Take 1 tablet by mouth at bedtime as needed.  0  . zolpidem (AMBIEN) 10 MG tablet Take 10 mg by mouth at bedtime.      No current facility-administered medications on file prior to visit.     BP 124/78   Pulse 79   Temp 98.2 F (36.8 C) (Oral)   Ht 5' 0.5" (1.537 m)   Wt 222 lb 12 oz (101 kg)   SpO2 97%   BMI 42.79 kg/m    Objective:   Physical Exam  Constitutional: She appears well-nourished.  Genitourinary:     Genitourinary Comments: 0.5 cm circular, mildly raised red spot to left inner buttocks as noted in picture. No surrounding erythema or swelling. No drainage.   Skin: Skin is warm and dry.           Assessment & Plan:  Pimple:  Noted to left buttocks on exam today, obviously popped. No surrounding erythema or drainage to suggest infection, no fevers. Healing well. Reassurance provided today. Discussed to keep the site clean and dry if possible. Gauze placed to area today. Return precautions provided.   Pleas Koch, NP

## 2018-09-09 ENCOUNTER — Ambulatory Visit: Payer: Medicare HMO | Admitting: Psychology

## 2018-09-23 ENCOUNTER — Ambulatory Visit: Payer: Medicare HMO | Admitting: Psychology

## 2018-09-26 ENCOUNTER — Ambulatory Visit: Payer: Medicare HMO | Admitting: Psychology

## 2018-09-26 DIAGNOSIS — F3132 Bipolar disorder, current episode depressed, moderate: Secondary | ICD-10-CM

## 2018-10-21 ENCOUNTER — Ambulatory Visit: Payer: Medicare HMO | Admitting: Psychology

## 2018-10-25 IMAGING — MG DIGITAL DIAGNOSTIC UNILATERAL LEFT MAMMOGRAM WITH TOMO AND CAD
6 series · 6 of 18 positions shown · non-contrast
Comparison: Previous exam(s).

CLINICAL DATA: 62-year-old female presenting for evaluation of a
palpable lump on the left areola.

EXAM:
DIGITAL DIAGNOSTIC LEFT MAMMOGRAM WITH CAD AND TOMO
ULTRASOUND LEFT BREAST

[L CC synth-2D]
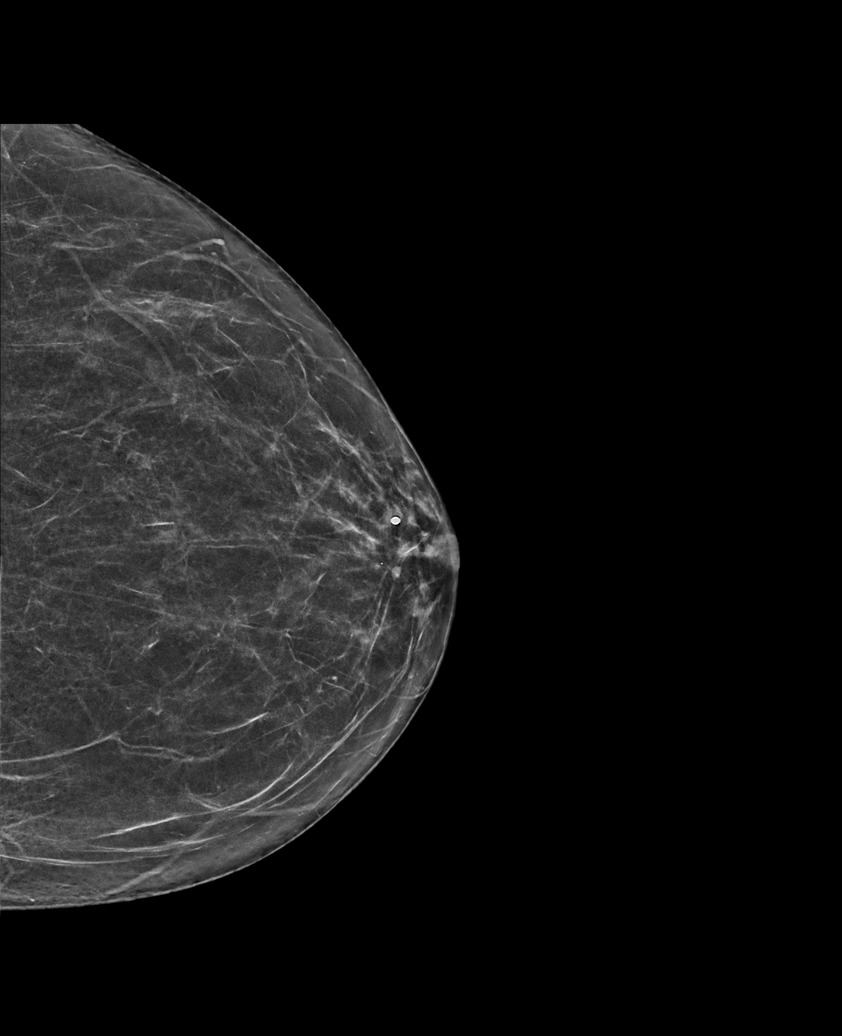

[L MLO synth-2D]
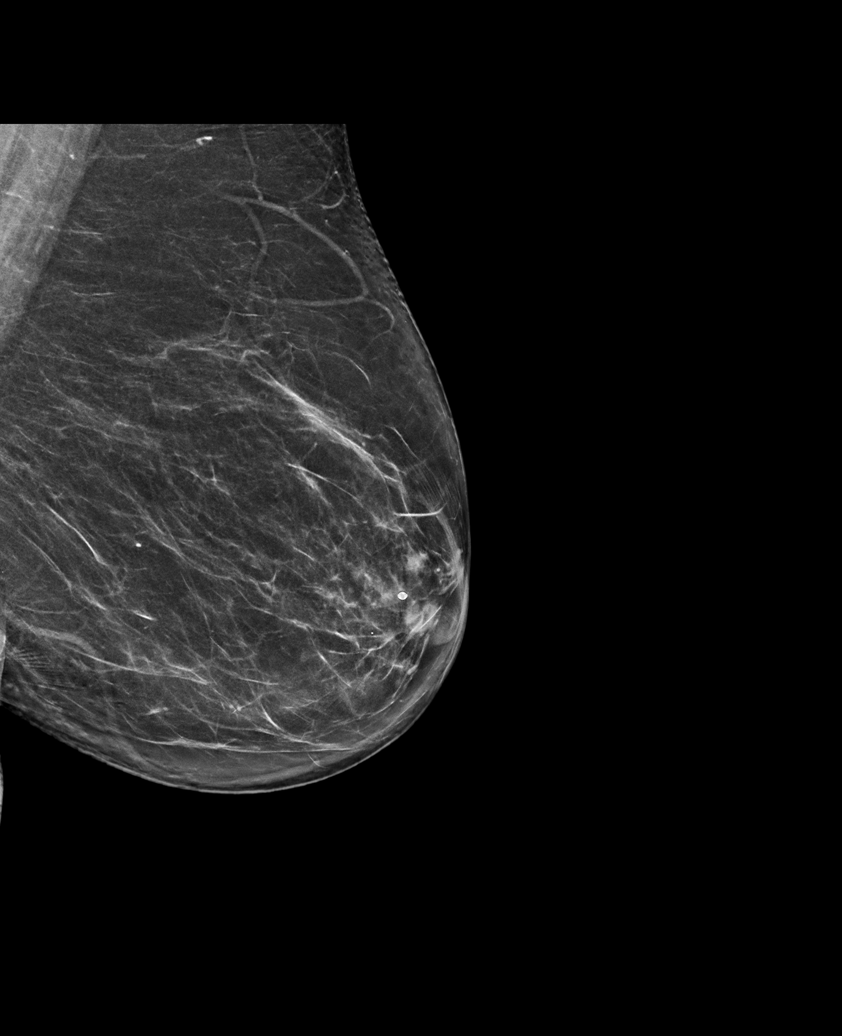

[L TAN synth-2D]
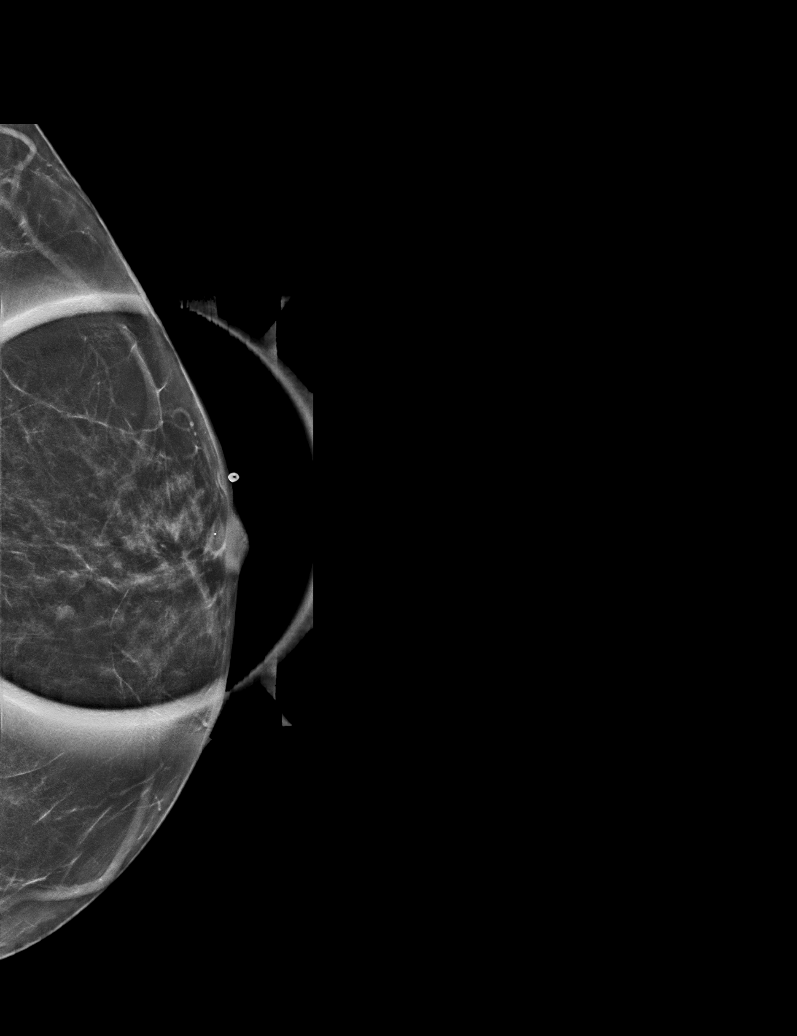

[L TAN tomo · tomo slice 23/46.0]
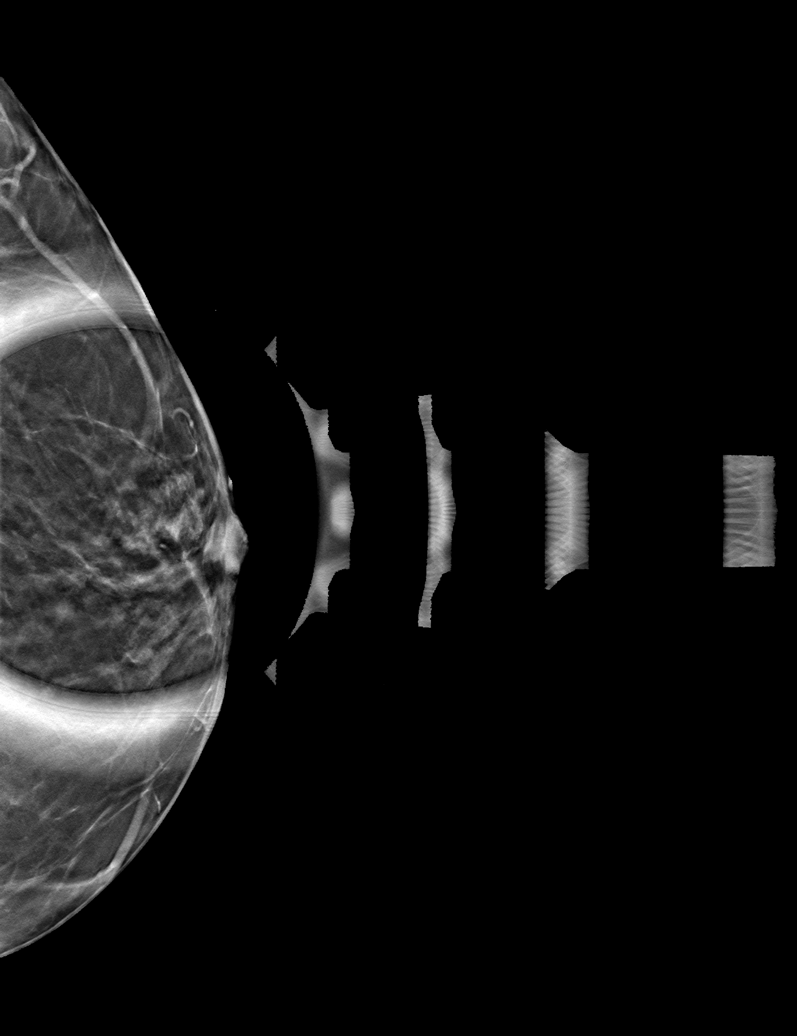

[L CC tomo · tomo slice 31/62.0]
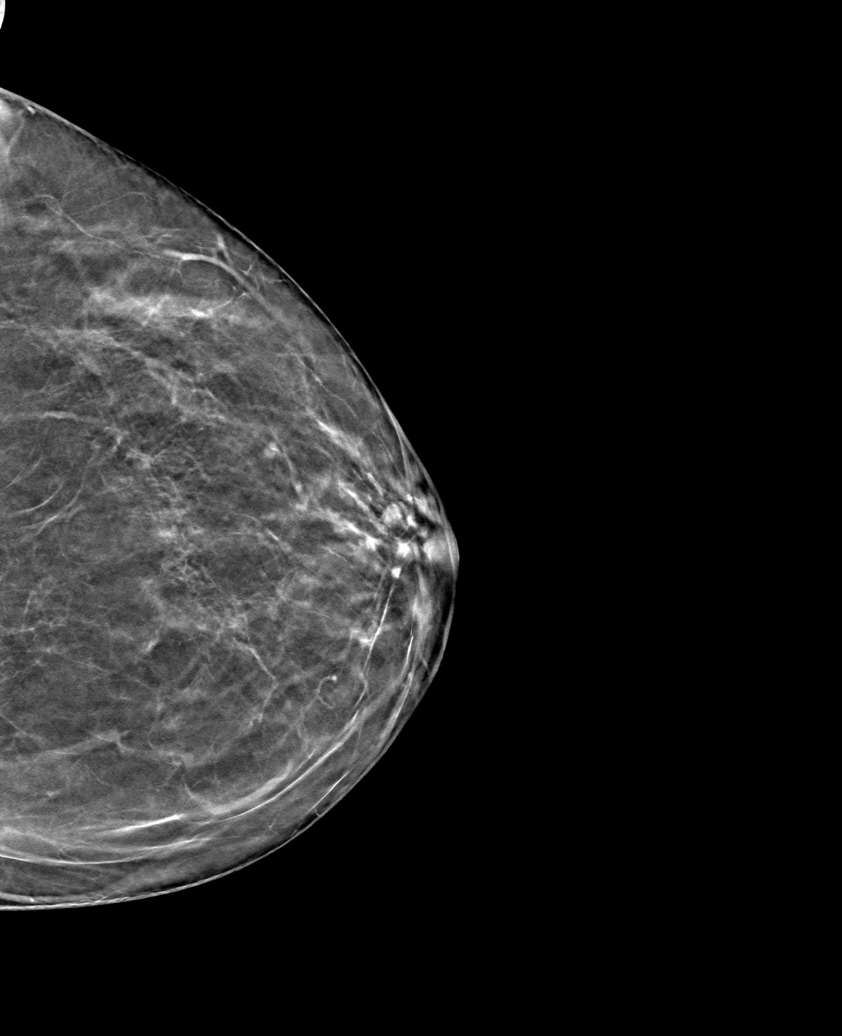

[L MLO tomo · tomo slice 39/78.0]
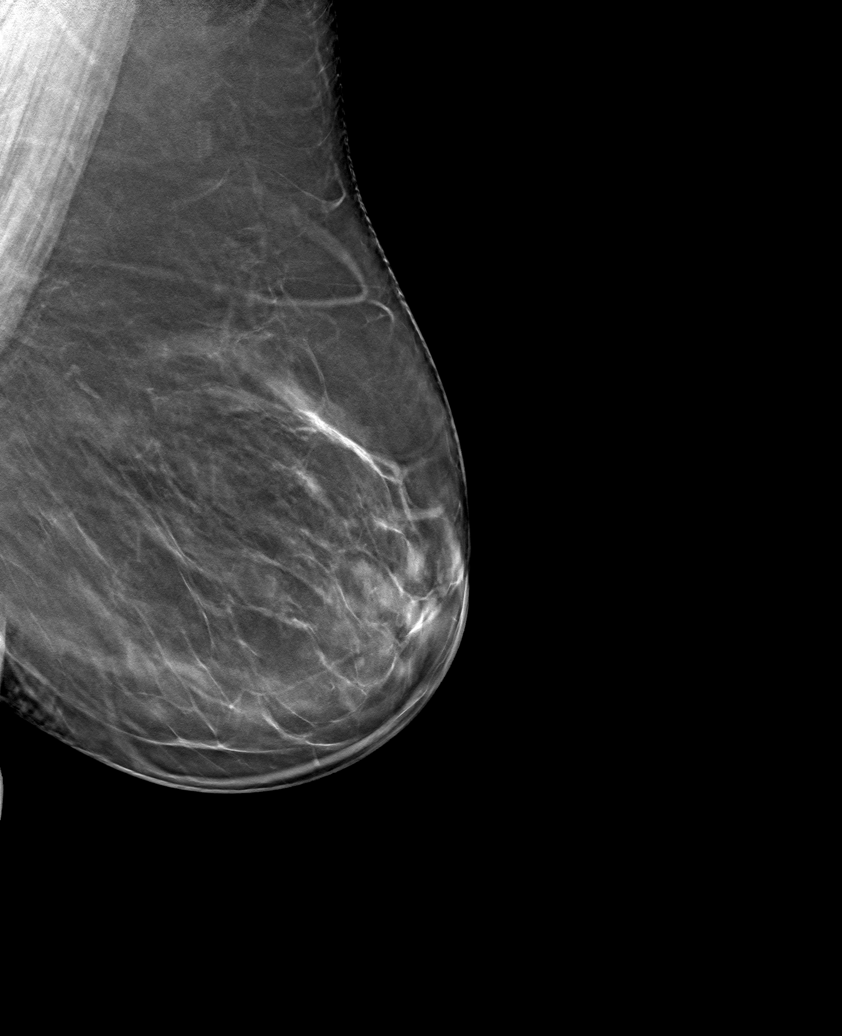

[6 of 18 positions shown; findings below may reference images not displayed]

ACR Breast Density Category b: There are scattered areas of
fibroglandular density.
FINDINGS: A BB has been placed on the palpable site along the slightly lower
outer left breast adjacent to the nipple. There is slight focal
thickening of the skin immediately deep to the marker. No other
suspicious calcifications, masses or areas of distortion are seen in
the left breast.

Mammographic images were processed with CAD.

On physical exam, there is a superficial BB sized palpable lump at
the site of concern in the lower outer areola.

Targeted ultrasound is performed, showing a hypoechoic mass within
the areola at [DATE] measuring 6 x 3 x 4 mm.
IMPRESSION: 1. The palpable left breast mass within the areola at [DATE] is a skin
lesion, likely associated with a benign [REDACTED] gland.

RECOMMENDATION:
1. I did advise patient that if the palpable area increases in size
or appears discolored or changes in any other concerning manner to
alert her doctor. Otherwise, return to routine screening mammography
is recommended. The patient will be due for screening in Monday December, 2018.

I have discussed the findings and recommendations with the patient.
Results were also provided in writing at the conclusion of the
visit. If applicable, a reminder letter will be sent to the patient
regarding the next appointment.

BI-RADS CATEGORY  2: Benign.

## 2018-10-28 ENCOUNTER — Ambulatory Visit: Payer: Medicare HMO | Admitting: Psychology

## 2018-11-18 ENCOUNTER — Ambulatory Visit: Payer: Medicare HMO | Admitting: Psychology

## 2018-11-18 DIAGNOSIS — F3132 Bipolar disorder, current episode depressed, moderate: Secondary | ICD-10-CM | POA: Diagnosis not present

## 2018-11-30 DIAGNOSIS — J0100 Acute maxillary sinusitis, unspecified: Secondary | ICD-10-CM | POA: Diagnosis not present

## 2018-12-16 ENCOUNTER — Ambulatory Visit: Payer: Medicare HMO | Admitting: Psychology

## 2018-12-16 DIAGNOSIS — F3132 Bipolar disorder, current episode depressed, moderate: Secondary | ICD-10-CM

## 2019-01-05 ENCOUNTER — Ambulatory Visit (INDEPENDENT_AMBULATORY_CARE_PROVIDER_SITE_OTHER): Payer: Medicare HMO | Admitting: Family Medicine

## 2019-01-05 ENCOUNTER — Ambulatory Visit (INDEPENDENT_AMBULATORY_CARE_PROVIDER_SITE_OTHER)
Admission: RE | Admit: 2019-01-05 | Discharge: 2019-01-05 | Disposition: A | Discharge status: Home / Self Care | Payer: Medicare HMO | Source: Ambulatory Visit | Attending: Family Medicine | Admitting: Family Medicine

## 2019-01-05 ENCOUNTER — Encounter: Payer: Self-pay | Admitting: Family Medicine

## 2019-01-05 VITALS — BP 124/78 | HR 70 | Temp 98.3°F | Ht 60.5 in | Wt 226.2 lb

## 2019-01-05 DIAGNOSIS — F3177 Bipolar disorder, in partial remission, most recent episode mixed: Secondary | ICD-10-CM | POA: Diagnosis not present

## 2019-01-05 DIAGNOSIS — S8012XA Contusion of left lower leg, initial encounter: Secondary | ICD-10-CM | POA: Diagnosis not present

## 2019-01-05 DIAGNOSIS — S20212A Contusion of left front wall of thorax, initial encounter: Secondary | ICD-10-CM | POA: Diagnosis not present

## 2019-01-05 MED ORDER — CEPHALEXIN 500 MG PO CAPS: 500.0000 mg | ORAL_CAPSULE | Freq: Three times a day (TID) | ORAL | 0 refills | Status: DC

## 2019-01-05 NOTE — Patient Instructions (Signed)
Continue ice when you can  Elevate  If you want to wrap it -go ahead   Take the keflex as directed  Your tetanus shot is up to date  Xray today

## 2019-01-05 NOTE — Assessment & Plan Note (Signed)
Discussed how this problem influences overall health and the risks it imposes  Reviewed plan for weight loss with lower calorie diet (via better food choices and also portion control or program like weight watchers) and exercise building up to or more than 30 minutes 5 days per week including some aerobic activity   Pt is anxious to get back to silver sneakers program when leg is healed

## 2019-01-05 NOTE — Assessment & Plan Note (Signed)
Stable Under care of psychiatry 

## 2019-01-05 NOTE — Progress Notes (Signed)
Subjective:    Patient ID: [name] [name], female    DOB: [birth year], 63 y.o.   MRN: [id number]  HPI  Here for leg injury  Pt hit her L leg when she fell on the steps in bus 1/12 (metal part) Hurt a lot  L leg still hurts and some redness   No skin tear or interruption  There is a hard knot   Hurts even when she is still   No fever or other symptoms   Tdap 9/18 - is up to date for tetanus imm   Xray of tib/fib: neg for fracture today   Wt Readings from Last 3 Encounters:  01/05/19 226 lb 4 oz (102.6 kg)  09/02/18 222 lb 12 oz (101 kg)  08/25/18 222 lb 8 oz (100.9 kg)  43.46 kg/m   Patient Active Problem List   Diagnosis Date Noted  . Contusion of left lower leg 01/05/2019  . Breast lump on left side at 5 o'clock position 08/25/2018  . Elevated glucose level 03/03/2018  . Shingles 01/10/2018  . Trochanteric bursitis 01/07/2018  . Low back pain 12/23/2017  . Thoracic back pain 12/23/2017  . Atrophic vaginitis 07/08/2017  . TMJ (temporomandibular joint disorder) 07/08/2017  . Cervical stenosis (uterine cervix) 01/03/2017  . Left anterior knee pain 10/08/2016  . Left knee pain 07/24/2016  . Hearing loss 10/25/2015  . Routine general medical examination at a health care facility 10/17/2015  . Encounter for routine gynecological examination 10/22/2014  . Colon cancer screening 10/16/2013  . Encounter for Medicare annual wellness exam 10/04/2011  . METATARSALGIA 12/01/2009  . POSTMENOPAUSAL STATUS 09/30/2009  . ARTHRITIS, RIGHT HIP 03/10/2009  . DEGENERATIVE DISC DISEASE, LUMBAR SPINE 03/10/2009  . ONYCHOMYCOSIS 09/17/2008  . Hypothyroidism 08/05/2007  . Morbid obesity ([place]) 08/05/2007  . Bipolar disorder ([place]) 08/05/2007  . ALLERGIC RHINITIS 08/05/2007  . GERD 08/05/2007  . IBS 08/05/2007  . HYPERCHOLESTEROLEMIA, PURE 07/07/2007   Past Medical History:  Diagnosis Date  . Allergic rhinitis   . Bipolar 1 disorder ([place])   . Fissure, anal   . GERD  (gastroesophageal reflux disease)   . History of basal cell carcinoma excision    ON FACE  . History of squamous cell carcinoma excision    ON RIGHT LEG  . Hyperlipidemia   . Hypothyroidism   . IBS (irritable bowel syndrome)   . Liver cyst   . Mallet finger of left hand    small finger   Past Surgical History:  Procedure Laterality Date  . CARDIAC CATHETERIZATION  04/1999   per pt normal  . CARDIOVASCULAR STRESS TEST  02-23-2011  dr Chrissie Noa [facility])   normal nuclear study/  no ischemia/  ef 64%  . CATARACT EXTRACTION W/ INTRAOCULAR LENS  IMPLANT, BILATERAL  2003   Bil  . CESAREAN SECTION  1984  . CLOSED REDUCTION FINGER WITH PERCUTANEOUS PINNING Left 04/22/2014   Procedure: LEFT SMALL FINGER CLOSED REDUCTION PINNING;  Surgeon: Linna Hoff, MD;  Location: [place];  Service: Orthopedics;  Laterality: Left;  . COLONOSCOPY  11/2002  . LAPAROSCOPIC CHOLECYSTECTOMY  03-18-2000  . TRANSTHORACIC ECHOCARDIOGRAM  02-23-2011   normal lvf/  ef 58%/  mild rve/   mild lae/  mild  mr  & tr/  mild pulmonary htn  . WISDOM TOOTH EXTRACTION     Social History   Tobacco Use  . Smoking status: Never Smoker  . Smokeless tobacco: Never Used  Substance Use Topics  .  Alcohol use: No    Alcohol/week: 0.0 standard drinks  . Drug use: No   Family History  Problem Relation Age of Onset  . Stroke Mother   . Heart disease Mother   . Hypertension Father   . Diabetes Father   . Hyperlipidemia Father   . Diabetes Brother    Allergies  Allergen Reactions  . Xylocaine [Lidocaine Hcl] Swelling    ALLERGIC TO ALL CAINES EXCEPT SENSORCAINE , AND MARCAINE  . Codeine Nausea And Vomiting    REACTION: vomits blood  . Lamictal [Lamotrigine] Other (See Comments)    REACTION: sores in mouth, muscle aches and also made her manic bipolar  . Lithium Swelling  . Tegretol [Carbamazepine] Other (See Comments)    REACTION: skin crawls   Current Outpatient Medications on File  Prior to Visit  Medication Sig Dispense Refill  . ARIPiprazole (ABILIFY) 30 MG tablet Take 30 mg by mouth daily.      Marland Kitchen aspirin 81 MG tablet Take 81 mg by mouth daily.      Marland Kitchen buPROPion (WELLBUTRIN XL) 150 MG 24 hr tablet Take two tablets by mouth every morning     . Cholecalciferol (VITAMIN D) 2000 UNITS CAPS Take 1 capsule by mouth daily.      . clonazePAM (KLONOPIN) 1 MG tablet Take 1 mg by mouth at bedtime as needed.      . fluticasone (FLONASE) 50 MCG/ACT nasal spray Place 2 sprays daily into both nostrils. 48 g 0  . lansoprazole (PREVACID) 30 MG capsule TAKE 1 CAPSULE EVERY DAY 90 capsule 3  . levothyroxine (SYNTHROID, LEVOTHROID) 50 MCG tablet TAKE 1 TABLET EVERY DAY 90 tablet 3  . lovastatin (MEVACOR) 40 MG tablet Take 1 tablet (40 mg total) by mouth daily. 90 tablet 3  . Multiple Vitamin (MULTIVITAMIN) tablet Take 1 tablet by mouth daily.      Marland Kitchen tiZANidine (ZANAFLEX) 4 MG tablet Take 1 tablet by mouth at bedtime as needed.  0  . zolpidem (AMBIEN) 10 MG tablet Take 10 mg by mouth at bedtime.      No current facility-administered medications on file prior to visit.     Review of Systems  Constitutional: Negative for activity change, appetite change, fatigue, fever and unexpected weight change.  HENT: Negative for sore throat.        Did not hit head   Eyes: Negative for pain, redness and visual disturbance.  Respiratory: Negative for cough, shortness of breath and wheezing.   Cardiovascular: Negative for chest pain and palpitations.  Gastrointestinal: Negative for abdominal pain, blood in stool, constipation and diarrhea.  Endocrine: Negative for polydipsia and polyuria.  Genitourinary: Negative for dysuria, frequency and urgency.  Musculoskeletal: Negative for arthralgias, back pain and myalgias.       Left leg contusion with pain   Skin: Negative for pallor and rash.  Allergic/Immunologic: Negative for environmental allergies.  Neurological: Positive for headaches. Negative for  dizziness and syncope.  Hematological: Negative for adenopathy. Does not bruise/bleed easily.  Psychiatric/Behavioral: Negative for decreased concentration and dysphoric mood. The patient is not nervous/anxious.        Objective:   Physical Exam Constitutional:      Appearance: Normal appearance. She is obese.  HENT:     Head: Normocephalic and atraumatic.  Eyes:     Extraocular Movements: Extraocular movements intact.     Conjunctiva/sclera: Conjunctivae normal.     Pupils: Pupils are equal, round, and reactive to light.  Neck:  Musculoskeletal: Normal range of motion.  Cardiovascular:     Rate and Rhythm: Normal rate and regular rhythm.     Pulses: Normal pulses.     Heart sounds: Normal heart sounds.  Pulmonary:     Effort: Pulmonary effort is normal. No respiratory distress.     Breath sounds: Normal breath sounds.  Musculoskeletal:        General: Swelling present.     Comments: Swelling of L leg dependent of injury/contusion   Skin:    Comments: 3 by 4 cm area of erythema and induration on L lower shin resembling hematoma No streaking Mildly warm to touch Tender (not severe)  No skin breakdown No fluctuance or crepitus  Sens/perfusion intact   Old bruising surrounds (yellow) with swelling down to foot  Excellent pulses     Neurological:     General: No focal deficit present.     Mental Status: She is alert.     Sensory: Sensation is intact.     Motor: Motor function is intact.  Psychiatric:        Mood and Affect: Mood normal.           Assessment & Plan:   Problem List Items Addressed This Visit      Other   Morbid obesity ([place])    Discussed how this problem influences overall health and the risks it imposes  Reviewed plan for weight loss with lower calorie diet (via better food choices and also portion control or program like weight watchers) and exercise building up to or more than 30 minutes 5 days per week including some aerobic activity    Pt is anxious to get back to silver sneakers program when leg is healed       Bipolar disorder ([place])    Stable Under care of psychiatry       Contusion of left lower leg - Primary    From trauma to shin on 1/12 Hematoma noted In light of persistent skin redness- keflex px to cover for poss infection  Xray of tib /fib today  Enc to elevate  Continue analgesics  Wrap if comfortable (for swelling) No s/s of compartment syndrome Expect this will take a while to improved given location  inst to alert [place] asap if any inc in redness/swelling or pain       Relevant Orders   DG Tibia/Fibula Left

## 2019-01-05 NOTE — Assessment & Plan Note (Signed)
From trauma to shin on 1/12 Hematoma noted In light of persistent skin redness- keflex px to cover for poss infection  Xray of tib /fib today  Enc to elevate  Continue analgesics  Wrap if comfortable (for swelling) No s/s of compartment syndrome Expect this will take a while to improved given location  inst to alert [place] asap if any inc in redness/swelling or pain

## 2019-01-13 ENCOUNTER — Ambulatory Visit: Payer: Medicare HMO | Admitting: Psychology

## 2019-01-14 ENCOUNTER — Ambulatory Visit (INDEPENDENT_AMBULATORY_CARE_PROVIDER_SITE_OTHER): Payer: Medicare HMO | Admitting: Psychology

## 2019-01-14 DIAGNOSIS — F3132 Bipolar disorder, current episode depressed, moderate: Secondary | ICD-10-CM

## 2019-02-10 ENCOUNTER — Ambulatory Visit (INDEPENDENT_AMBULATORY_CARE_PROVIDER_SITE_OTHER): Payer: Medicare HMO | Admitting: Psychology

## 2019-02-10 DIAGNOSIS — F3132 Bipolar disorder, current episode depressed, moderate: Secondary | ICD-10-CM

## 2019-03-02 ENCOUNTER — Other Ambulatory Visit: Payer: Medicare HMO

## 2019-03-02 ENCOUNTER — Ambulatory Visit: Payer: Self-pay

## 2019-03-09 ENCOUNTER — Encounter: Payer: Self-pay | Admitting: Family Medicine

## 2019-03-10 ENCOUNTER — Ambulatory Visit (INDEPENDENT_AMBULATORY_CARE_PROVIDER_SITE_OTHER): Payer: Medicare HMO | Admitting: Psychology

## 2019-03-10 ENCOUNTER — Ambulatory Visit: Payer: Medicare HMO | Admitting: Psychology

## 2019-03-10 DIAGNOSIS — F3132 Bipolar disorder, current episode depressed, moderate: Secondary | ICD-10-CM

## 2019-03-23 ENCOUNTER — Other Ambulatory Visit: Payer: Self-pay | Admitting: Family Medicine

## 2019-04-07 ENCOUNTER — Ambulatory Visit: Payer: Medicare HMO | Admitting: Psychology

## 2019-04-15 ENCOUNTER — Other Ambulatory Visit: Payer: Self-pay | Admitting: Family Medicine

## 2019-04-16 ENCOUNTER — Other Ambulatory Visit: Payer: Self-pay | Admitting: Family Medicine

## 2019-05-05 ENCOUNTER — Ambulatory Visit: Payer: Medicare HMO | Admitting: Psychology

## 2019-05-07 ENCOUNTER — Telehealth: Payer: Self-pay | Admitting: Family Medicine

## 2019-05-07 DIAGNOSIS — R7309 Other abnormal glucose: Secondary | ICD-10-CM

## 2019-05-07 DIAGNOSIS — E039 Hypothyroidism, unspecified: Secondary | ICD-10-CM

## 2019-05-07 DIAGNOSIS — E7800 Pure hypercholesterolemia, unspecified: Secondary | ICD-10-CM

## 2019-05-07 DIAGNOSIS — Z0000 Encounter for general adult medical examination without abnormal findings: Secondary | ICD-10-CM

## 2019-05-07 NOTE — Telephone Encounter (Signed)
-----   Message from Cloyd Stagers, RT sent at 04/30/2019  1:33 PM EDT ----- Regarding: Lab Orders for Friday 5.29.2020 Please place lab orders for Friday 5.29.2020, office visit for physical on Thursday 6.4.2020 Thank you, Dyke Maes RT(R)

## 2019-05-08 ENCOUNTER — Telehealth: Payer: Self-pay

## 2019-05-08 ENCOUNTER — Ambulatory Visit (INDEPENDENT_AMBULATORY_CARE_PROVIDER_SITE_OTHER): Payer: Medicare HMO

## 2019-05-08 ENCOUNTER — Other Ambulatory Visit (INDEPENDENT_AMBULATORY_CARE_PROVIDER_SITE_OTHER): Payer: Medicare HMO

## 2019-05-08 DIAGNOSIS — Z0000 Encounter for general adult medical examination without abnormal findings: Secondary | ICD-10-CM

## 2019-05-08 DIAGNOSIS — R7309 Other abnormal glucose: Secondary | ICD-10-CM | POA: Diagnosis not present

## 2019-05-08 DIAGNOSIS — E7800 Pure hypercholesterolemia, unspecified: Secondary | ICD-10-CM

## 2019-05-08 DIAGNOSIS — E039 Hypothyroidism, unspecified: Secondary | ICD-10-CM

## 2019-05-08 LAB — CBC WITH DIFFERENTIAL/PLATELET
Basophils Absolute: 0 10*3/uL (ref 0.0–0.1)
Basophils Relative: 0.8 % (ref 0.0–3.0)
Eosinophils Absolute: 0 10*3/uL (ref 0.0–0.7)
Eosinophils Relative: 0.2 % (ref 0.0–5.0)
HCT: 40.2 % (ref 36.0–46.0)
Hemoglobin: 13.6 g/dL (ref 12.0–15.0)
Lymphocytes Relative: 32.3 % (ref 12.0–46.0)
Lymphs Abs: 1.7 10*3/uL (ref 0.7–4.0)
MCHC: 33.7 g/dL (ref 30.0–36.0)
MCV: 88.7 fl (ref 78.0–100.0)
Monocytes Absolute: 0.4 10*3/uL (ref 0.1–1.0)
Monocytes Relative: 7 % (ref 3.0–12.0)
Neutro Abs: 3.1 10*3/uL (ref 1.4–7.7)
Neutrophils Relative %: 59.7 % (ref 43.0–77.0)
Platelets: 205 10*3/uL (ref 150.0–400.0)
RBC: 4.53 Mil/uL (ref 3.87–5.11)
RDW: 13.8 % (ref 11.5–15.5)
WBC: 5.2 10*3/uL (ref 4.0–10.5)

## 2019-05-08 LAB — LIPID PANEL
Cholesterol: 184 mg/dL (ref 0–200)
HDL: 66.2 mg/dL (ref >39.00)
LDL Cholesterol: 88 mg/dL (ref 0–99)
NonHDL: 117.38
Total CHOL/HDL Ratio: 3
Triglycerides: 148 mg/dL (ref 0.0–149.0)
VLDL: 29.6 mg/dL (ref 0.0–40.0)

## 2019-05-08 LAB — COMPREHENSIVE METABOLIC PANEL
ALT: 16 U/L (ref 0–35)
AST: 21 U/L (ref 0–37)
Albumin: 4.2 g/dL (ref 3.5–5.2)
Alkaline Phosphatase: 67 U/L (ref 39–117)
BUN: 21 mg/dL (ref 6–23)
CO2: 27 mEq/L (ref 19–32)
Calcium: 9.7 mg/dL (ref 8.4–10.5)
Chloride: 105 mEq/L (ref 96–112)
Creatinine, Ser: 1.21 mg/dL — ABNORMAL HIGH (ref 0.40–1.20)
GFR: 45.21 mL/min — ABNORMAL LOW (ref >60.00)
Glucose, Bld: 101 mg/dL — ABNORMAL HIGH (ref 70–99)
Potassium: 4 mEq/L (ref 3.5–5.1)
Sodium: 142 mEq/L (ref 135–145)
Total Bilirubin: 0.5 mg/dL (ref 0.2–1.2)
Total Protein: 7.1 g/dL (ref 6.0–8.3)

## 2019-05-08 LAB — TSH: TSH: 3.14 u[IU]/mL (ref 0.35–4.50)

## 2019-05-08 LAB — HEMOGLOBIN A1C: Hgb A1c MFr Bld: 5.7 % (ref 4.6–6.5)

## 2019-05-08 NOTE — Progress Notes (Signed)
PCP notes:   Health maintenance:  Mammogram - postponed  Abnormal screenings:   None  Patient concerns:   None  Nurse concerns:  None  Next PCP appt:   05/14/19 @ 1215  I reviewed health advisor's note, was available for consultation, and agree with documentation and plan. Loura Pardon MD

## 2019-05-08 NOTE — Progress Notes (Signed)
Subjective:   [name] [name] is a 63 y.o. female who presents for Medicare Annual (Subsequent) preventive examination.  Review of Systems:  N/A Cardiac Risk Factors include: advanced age (>57men, >58 women);dyslipidemia;obesity (BMI >30kg/m2)     Objective:     Vitals: There were no vitals taken for this visit.  There is no height or weight on file to calculate BMI.  Advanced Directives 05/08/2019 02/24/2018 02/20/2017 03/26/2016 05/20/2015 04/22/2014  Does Patient Have a Medical Advance Directive? No No Yes No No Patient does not have advance directive;Patient would like information  Type of Advance Directive - - [place];Living will - - -  Copy of [place] in Chart? - - No - copy requested - - -  Would patient like information on creating a medical advance directive? No - Patient declined No - Patient declined - No - patient declined information - Advance directive packet given    Tobacco Social History   Tobacco Use  Smoking Status Never Smoker  Smokeless Tobacco Never Used     Counseling given: No   Clinical Intake:  Pre-visit preparation completed: Yes  Pain : No/denies pain Pain Score: 0-No pain     Nutritional Status: BMI > 30  Obese Nutritional Risks: None Diabetes: No  How often do you need to have someone help you when you read instructions, pamphlets, or other written materials from your doctor or pharmacy?: 1 - Never What is the last grade level you completed in school?: Bachelor  Interpreter Needed?: No  Comments: pt lives with spouse Information entered by :: LPinson, LPN  Past Medical History:  Diagnosis Date  . Allergic rhinitis   . Bipolar 1 disorder ([place])   . Fissure, anal   . GERD (gastroesophageal reflux disease)   . History of basal cell carcinoma excision    ON FACE  . History of squamous cell carcinoma excision    ON RIGHT LEG  . Hyperlipidemia   . Hypothyroidism   . IBS (irritable bowel  syndrome)   . Liver cyst   . Mallet finger of left hand    small finger   Past Surgical History:  Procedure Laterality Date  . CARDIAC CATHETERIZATION  04/1999   per pt normal  . CARDIOVASCULAR STRESS TEST  02-23-2011  dr Chrissie Noa [facility] clinic)   normal nuclear study/  no ischemia/  ef 64%  . CATARACT EXTRACTION W/ INTRAOCULAR LENS  IMPLANT, BILATERAL  2003   Bil  . CESAREAN SECTION  1984  . CLOSED REDUCTION FINGER WITH PERCUTANEOUS PINNING Left 04/22/2014   Procedure: LEFT SMALL FINGER CLOSED REDUCTION PINNING;  Surgeon: Linna Hoff, MD;  Location: [facility];  Service: Orthopedics;  Laterality: Left;  . COLONOSCOPY  11/2002  . LAPAROSCOPIC CHOLECYSTECTOMY  03-18-2000  . TRANSTHORACIC ECHOCARDIOGRAM  02-23-2011   normal lvf/  ef 58%/  mild rve/   mild lae/  mild  mr  & tr/  mild pulmonary htn  . WISDOM TOOTH EXTRACTION     Family History  Problem Relation Age of Onset  . Stroke Mother   . Heart disease Mother   . Hypertension Father   . Diabetes Father   . Hyperlipidemia Father   . Diabetes Brother    Social History   Socioeconomic History  . Marital status: Married    Spouse name: Not on file  . Number of children: 1  . Years of education: Not on file  . Highest education level:  Not on file  Occupational History  . Occupation: Home    Employer: RETIRED  Social Needs  . Financial resource strain: Not on file  . Food insecurity:    Worry: Not on file    Inability: Not on file  . Transportation needs:    Medical: Not on file    Non-medical: Not on file  Tobacco Use  . Smoking status: Never Smoker  . Smokeless tobacco: Never Used  Substance and Sexual Activity  . Alcohol use: No    Alcohol/week: 0.0 standard drinks  . Drug use: No  . Sexual activity: Not Currently    Birth control/protection: Post-menopausal  Lifestyle  . Physical activity:    Days per week: Not on file    Minutes per session: Not on file  . Stress: Not on file   Relationships  . Social connections:    Talks on phone: Not on file    Gets together: Not on file    Attends religious service: Not on file    Active member of club or organization: Not on file    Attends meetings of clubs or organizations: Not on file    Relationship status: Not on file  Other Topics Concern  . Not on file  Social History Narrative   Moved here from [place]    Outpatient Encounter Medications as of 05/08/2019  Medication Sig  . ARIPiprazole (ABILIFY) 30 MG tablet Take 30 mg by mouth daily.    Marland Kitchen aspirin 81 MG tablet Take 81 mg by mouth daily.    . Biotin w/ Vitamins C & E (HAIR/SKIN/NAILS PO) Take 1 tablet by mouth daily.  Marland Kitchen buPROPion (WELLBUTRIN XL) 150 MG 24 hr tablet Take three tablets by mouth every morning  . Cholecalciferol (VITAMIN D) 2000 UNITS CAPS Take 1 capsule by mouth daily.    . clonazePAM (KLONOPIN) 1 MG tablet Take 1 mg by mouth at bedtime as needed.    . Coenzyme Q10 (COQ10 PO) Take 100 mg by mouth daily.  . fluticasone (FLONASE) 50 MCG/ACT nasal spray Place 2 sprays daily into both nostrils.  . lansoprazole (PREVACID) 30 MG capsule TAKE 1 CAPSULE EVERY DAY  . levothyroxine (SYNTHROID) 50 MCG tablet TAKE 1 TABLET EVERY DAY  . lovastatin (MEVACOR) 40 MG tablet TAKE 1 TABLET EVERY DAY  . Multiple Vitamin (MULTIVITAMIN) tablet Take 1 tablet by mouth daily.    Marland Kitchen tiZANidine (ZANAFLEX) 4 MG tablet Take 1 tablet by mouth at bedtime as needed.  . zolpidem (AMBIEN) 10 MG tablet Take 10 mg by mouth at bedtime.   . [DISCONTINUED] cephALEXin (KEFLEX) 500 MG capsule Take 1 capsule (500 mg total) by mouth 3 (three) times daily.   No facility-administered encounter medications on file as of 05/08/2019.     Activities of Daily Living In your present state of health, do you have any difficulty performing the following activities: 05/08/2019  Hearing? N  Vision? N  Difficulty concentrating or making decisions? N  Walking or climbing stairs? Y  Dressing or bathing? N   Doing errands, shopping? N  Preparing Food and eating ? N  Using the Toilet? N  In the past six months, have you accidently leaked urine? N  Do you have problems with loss of bowel control? N  Managing your Medications? N  Managing your Finances? N  Housekeeping or managing your Housekeeping? N  Some recent data might be hidden    Patient Care Team: Tower, Wynelle Fanny, MD as PCP - General Junie Panning  Glennon Mac, OD as Consulting Physician (Optometry)    Assessment:   This is a routine wellness examination for [place].  Vision Screening Comments: Vision exam 1-2 yrs ago @ [facility]  Exercise Activities and Dietary recommendations Current Exercise Habits: The patient does not participate in regular exercise at present, Exercise limited by: None identified  Goals    . Patient Stated     Starting 05/08/19, I will continue to take medications as prescribed.        Fall Risk Fall Risk  05/08/2019 02/24/2018 02/20/2017 10/25/2015 10/22/2014  Falls in the past year? 1 Yes No No Yes  Comment fell while walking up steps on bus fell after slipping in mud; brusing to elbow and knee - - -  Number falls in past yr: 0 1 - - 1  Injury with Fall? 1 Yes - - Yes  Comment - - - - -   Depression Screen PHQ 2/9 Scores 05/08/2019 05/08/2019 02/24/2018 02/20/2017  PHQ - 2 Score 3 0 2 2  PHQ- 9 Score 7 - 8 5     Cognitive Function MMSE - Mini Mental State Exam 05/08/2019 02/24/2018 02/20/2017  Orientation to time 5 5 5   Orientation to Place 5 5 5   Registration 3 3 3   Attention/ Calculation 0 0 0  Recall 3 3 2   Recall-comments - - pt was unable to recall 1 of 3 words  Language- name 2 objects 0 0 0  Language- repeat 1 1 1   Language- follow 3 step command 0 3 3  Language- read & follow direction 0 0 0  Write a sentence 0 0 0  Copy design 0 0 0  Total score 17 20 19        PLEASE NOTE: A Mini-Cog screen was completed. Maximum score is 17. A value of 0 denotes this part of [place] MMSE was not  completed or the patient failed this part of the Mini-Cog screening.   Mini-Cog Screening Orientation to Time - Max 5 pts Orientation to Place - Max 5 pts Registration - Max 3 pts Recall - Max 3 pts Language Repeat - Max 1 pts    Immunization History  Administered Date(s) Administered  . Influenza Split 09/16/2012  . Influenza Whole 10/01/2006, 09/14/2008, 09/07/2009, 09/04/2010, 08/21/2011  . Influenza, Seasonal, Injecte, Preservative Fre 09/13/2016  . Influenza,inj,Quad PF,6+ Mos 08/26/2017, 08/25/2018  . Influenza-Unspecified 09/08/2013, 08/10/2014, 08/25/2015  . Pneumococcal Polysaccharide-23 08/15/2006, 10/16/2013  . Td 12/10/1998, 09/30/2009  . Tdap 08/29/2017  . Zoster 04/13/2011    Screening Tests Health Maintenance  Topic Date Due  . MAMMOGRAM  12/10/2019 (Originally 12/26/2018)  . HIV Screening  01/12/2024 (Originally 03[birth year]/1973)  . INFLUENZA VACCINE  07/11/2019  . PAP SMEAR-Modifier  01/04/2020  . COLONOSCOPY  05/19/2025  . TETANUS/TDAP  08/30/2027  . Hepatitis C Screening  Completed      Plan:     I have personally reviewed, addressed, and noted the following in the patient's chart:  A. Medical and social history B. Use of alcohol, tobacco or illicit drugs  C. Current medications and supplements D. Functional ability and status E.  Nutritional status F.  Physical activity G. Advance directives H. List of other physicians I.  Hospitalizations, surgeries, and ER visits in previous 12 months J.  Vitals (unless it is a telemedicine encounter) K. Screenings to include cognitive, depression, hearing, vision (NOTE: hearing and vision screenings not completed in telemedicine encounter) L. Referrals and appointments   In addition, I have reviewed and  discussed with patient certain preventive protocols, quality metrics, and best practice recommendations. A written personalized care plan for preventive services and recommendations were provided to patient.  With  patient's permission, we connected on 05/08/19 at 12:00 PM EDT. Interactive audio and video telecommunications were attempted with patient. This attempt was unsuccessful due to patient having technical difficulties OR patient did not have access to video capability.  Encounter was completed with audio only.  Two patient identifiers were used to ensure the encounter occurred with the correct person.   Patient was in home and writer was in office.   Signed,   Lindell Noe, MHA, BS, LPN Health Coach

## 2019-05-08 NOTE — Patient Instructions (Signed)
[name] [name] , Thank you for taking time to come for your Medicare Wellness Visit. I appreciate your ongoing commitment to your health goals. Please review the following plan we discussed and let me know if I can assist you in the future.   These are the goals we discussed: Goals    . Patient Stated     Starting 05/08/19, I will continue to take medications as prescribed.        This is a list of the screening recommended for you and due dates:  Health Maintenance  Topic Date Due  . Mammogram  12/10/2019*  . HIV Screening  01/12/2024*  . Flu Shot  07/11/2019  . Pap Smear  01/04/2020  . Colon Cancer Screening  05/19/2025  . Tetanus Vaccine  08/30/2027  .  Hepatitis C: One time screening is recommended by Center for Disease Control  (CDC) for  adults born from [birth year] through 1963.   Completed  *Topic was postponed. The date shown is not the original due date.   Preventive Care for Adults  A healthy lifestyle and preventive care can promote health and wellness. Preventive health guidelines for adults include the following key practices.  . A routine yearly physical is a good way to check with your health care provider about your health and preventive screening. It is a chance to share any concerns and updates on your health and to receive a thorough exam.  . Visit your dentist for a routine exam and preventive care every 6 months. Brush your teeth twice a day and floss once a day. Good oral hygiene prevents tooth decay and gum disease.  . The frequency of eye exams is based on your age, health, family medical history, use  of contact lenses, and other factors. Follow your health care provider's recommendations for frequency of eye exams.  . Eat a healthy diet. Foods like vegetables, fruits, whole grains, low-fat dairy products, and lean protein foods contain the nutrients you need without too many calories. Decrease your intake of foods high in solid fats, added sugars, and salt. Eat the  right amount of calories for you. Get information about a proper diet from your health care provider, if necessary.  . Regular physical exercise is one of the most important things you can do for your health. Most adults should get at least 150 minutes of moderate-intensity exercise (any activity that increases your heart rate and causes you to sweat) each week. In addition, most adults need muscle-strengthening exercises on 2 or more days a week.  Silver Sneakers may be a benefit available to you. To determine eligibility, you may visit the website: www.silversneakers.com or contact program at 631-333-6978 Mon-Fri between 8AM-8PM.   . Maintain a healthy weight. The body mass index (BMI) is a screening tool to identify possible weight problems. It provides an estimate of body fat based on height and weight. Your health care provider can find your BMI and can help you achieve or maintain a healthy weight.   For adults 20 years and older: ? A BMI below 18.5 is considered underweight. ? A BMI of 18.5 to 24.9 is normal. ? A BMI of 25 to 29.9 is considered overweight. ? A BMI of 30 and above is considered obese.   . Maintain normal blood lipids and cholesterol levels by exercising and minimizing your intake of saturated fat. Eat a balanced diet with plenty of fruit and vegetables. Blood tests for lipids and cholesterol should begin at age 85 and  be repeated every 5 years. If your lipid or cholesterol levels are high, you are over 50, or you are at high risk for heart disease, you may need your cholesterol levels checked more frequently. Ongoing high lipid and cholesterol levels should be treated with medicines if diet and exercise are not working.  . If you smoke, find out from your health care provider how to quit. If you do not use tobacco, please do not start.  . If you choose to drink alcohol, please do not consume more than 2 drinks per day. One drink is considered to be 12 ounces (355 mL) of  beer, 5 ounces (148 mL) of wine, or 1.5 ounces (44 mL) of liquor.  . If you are 53-52 years old, ask your health care provider if you should take aspirin to prevent strokes.  . Use sunscreen. Apply sunscreen liberally and repeatedly throughout the day. You should seek shade when your shadow is shorter than you. Protect yourself by wearing long sleeves, pants, a wide-brimmed hat, and sunglasses year round, whenever you are outdoors.  . Once a month, do a whole body skin exam, using a mirror to look at the skin on your back. Tell your health care provider of new moles, moles that have irregular borders, moles that are larger than a pencil eraser, or moles that have changed in shape or color.

## 2019-05-08 NOTE — Telephone Encounter (Signed)
During AWV, patient requested refill for fluticasone nasal spray.   Last filled 10/2017. No refills.   [place]

## 2019-05-11 MED ORDER — FLUTICASONE PROPIONATE 50 MCG/ACT NA SUSP: 2.0000 | Freq: Every day | NASAL | 3 refills | Status: DC

## 2019-05-14 ENCOUNTER — Other Ambulatory Visit: Payer: Self-pay

## 2019-05-14 ENCOUNTER — Ambulatory Visit (INDEPENDENT_AMBULATORY_CARE_PROVIDER_SITE_OTHER): Payer: Medicare HMO | Admitting: Family Medicine

## 2019-05-14 ENCOUNTER — Encounter: Payer: Self-pay | Admitting: Family Medicine

## 2019-05-14 ENCOUNTER — Other Ambulatory Visit: Payer: Self-pay | Admitting: Family Medicine

## 2019-05-14 ENCOUNTER — Other Ambulatory Visit (HOSPITAL_COMMUNITY)
Admission: RE | Admit: 2019-05-14 | Discharge: 2019-05-14 | Disposition: A | Discharge status: Home / Self Care | Payer: Medicare HMO | Source: Ambulatory Visit | Attending: Family Medicine | Admitting: Family Medicine

## 2019-05-14 VITALS — BP 118/76 | HR 64 | Temp 98.2°F | Ht 60.5 in | Wt 223.3 lb

## 2019-05-14 DIAGNOSIS — Z01419 Encounter for gynecological examination (general) (routine) without abnormal findings: Secondary | ICD-10-CM | POA: Insufficient documentation

## 2019-05-14 DIAGNOSIS — E039 Hypothyroidism, unspecified: Secondary | ICD-10-CM

## 2019-05-14 DIAGNOSIS — F3177 Bipolar disorder, in partial remission, most recent episode mixed: Secondary | ICD-10-CM

## 2019-05-14 DIAGNOSIS — R7309 Other abnormal glucose: Secondary | ICD-10-CM | POA: Diagnosis not present

## 2019-05-14 DIAGNOSIS — Z0000 Encounter for general adult medical examination without abnormal findings: Secondary | ICD-10-CM | POA: Diagnosis not present

## 2019-05-14 DIAGNOSIS — E7800 Pure hypercholesterolemia, unspecified: Secondary | ICD-10-CM | POA: Diagnosis not present

## 2019-05-14 DIAGNOSIS — Z1231 Encounter for screening mammogram for malignant neoplasm of breast: Secondary | ICD-10-CM

## 2019-05-14 NOTE — Assessment & Plan Note (Signed)
Continues lovastatin  Stable Disc goals for lipids and reasons to control them Rev last labs with pt Rev low sat fat diet in detail

## 2019-05-14 NOTE — Patient Instructions (Signed)
Work on healthy diet/exercise and weight loss  Try to get most of your carbohydrates from produce (with the exception of white potatoes)  Eat less bread/pasta/rice/snack foods/cereals/sweets and other items from the middle of the grocery store (processed carbs)  Gradually increase exercise to 60 minutes daily  Walking is good See if there are home exercise programs to stream also   Avoid nsaids for kidney health Drink lots of water   Please call and schedule your mammogram

## 2019-05-14 NOTE — Assessment & Plan Note (Signed)
Discussed how this problem influences overall health and the risks it imposes  Reviewed plan for weight loss with lower calorie diet (via better food choices and also portion control or program like weight watchers) and exercise building up to or more than 30 minutes 5 days per week including some aerobic activity    

## 2019-05-14 NOTE — Progress Notes (Signed)
Subjective:    Patient ID: [name] [name], female    DOB: August 02, 1956, 63 y.o.   MRN: [id number]  HPI  Here for health maintenance exam and to review chronic medical problems   Wt Readings from Last 3 Encounters:  05/14/19 223 lb 5 oz (101.3 kg)  01/05/19 226 lb 4 oz (102.6 kg)  09/02/18 222 lb 12 oz (101 kg)   42.89 kg/m   Cannot exercise as much due to knee pain worse on the R  Had amw 5/29  No gaps or concerns   Mammogram 1/19  Self breast exam   Pap 1/18 -neg with neg HPV Wants one today   Colonoscopy 6/16- normal   Trying to eat better but too much ice cream   BP Readings from Last 3 Encounters:  05/14/19 118/76  01/05/19 124/78  09/02/18 124/78   Pulse Readings from Last 3 Encounters:  05/14/19 64  01/05/19 70  09/02/18 79   Lab Results  Component Value Date   CREATININE 1.21 (H) 05/08/2019   BUN 21 05/08/2019   NA 142 05/08/2019   K 4.0 05/08/2019   CL 105 05/08/2019   CO2 27 05/08/2019  this is stable  Avoiding nsaids and drinking fluids   Lab Results  Component Value Date   ALT 16 05/08/2019   AST 21 05/08/2019   ALKPHOS 67 05/08/2019   BILITOT 0.5 05/08/2019    Lab Results  Component Value Date   WBC 5.2 05/08/2019   HGB 13.6 05/08/2019   HCT 40.2 05/08/2019   MCV 88.7 05/08/2019   PLT 205.0 05/08/2019     Hypothyroidism  Pt has no clinical changes No change in energy level/ hair or skin/ edema and no tremor Lab Results  Component Value Date   TSH 3.14 05/08/2019     Hyperlipidemia Lab Results  Component Value Date   CHOL 184 05/08/2019   CHOL 178 02/24/2018   CHOL 177 12/28/2016   Lab Results  Component Value Date   HDL 66.20 05/08/2019   HDL 72.30 02/24/2018   HDL 63.10 12/28/2016   Lab Results  Component Value Date   LDLCALC 88 05/08/2019   LDLCALC 85 02/24/2018   LDLCALC 82 12/28/2016   Lab Results  Component Value Date   TRIG 148.0 05/08/2019   TRIG 108.0 02/24/2018   TRIG 160.0 (H) 12/28/2016   Lab  Results  Component Value Date   CHOLHDL 3 05/08/2019   CHOLHDL 2 02/24/2018   CHOLHDL 3 12/28/2016   No results found for: LDLDIRECT  Patient Active Problem List   Diagnosis Date Noted  . Contusion of left lower leg 01/05/2019  . Breast lump on left side at 5 o'clock position 08/25/2018  . Elevated glucose level 03/03/2018  . Trochanteric bursitis 01/07/2018  . Low back pain 12/23/2017  . Thoracic back pain 12/23/2017  . Atrophic vaginitis 07/08/2017  . TMJ (temporomandibular joint disorder) 07/08/2017  . Cervical stenosis (uterine cervix) 01/03/2017  . Left anterior knee pain 10/08/2016  . Left knee pain 07/24/2016  . Hearing loss 10/25/2015  . Routine general medical examination at a health care facility 10/17/2015  . Encounter for routine gynecological examination 10/22/2014  . Colon cancer screening 10/16/2013  . Encounter for Medicare annual wellness exam 10/04/2011  . METATARSALGIA 12/01/2009  . POSTMENOPAUSAL STATUS 09/30/2009  . ARTHRITIS, RIGHT HIP 03/10/2009  . DEGENERATIVE DISC DISEASE, LUMBAR SPINE 03/10/2009  . ONYCHOMYCOSIS 09/17/2008  . Hypothyroidism 08/05/2007  . Morbid obesity ([place]) 08/05/2007  .  Bipolar disorder ([place]) 08/05/2007  . ALLERGIC RHINITIS 08/05/2007  . GERD 08/05/2007  . IBS 08/05/2007  . HYPERCHOLESTEROLEMIA, PURE 07/07/2007   Past Medical History:  Diagnosis Date  . Allergic rhinitis   . Bipolar 1 disorder ([place])   . Fissure, anal   . GERD (gastroesophageal reflux disease)   . History of basal cell carcinoma excision    ON FACE  . History of squamous cell carcinoma excision    ON RIGHT LEG  . Hyperlipidemia   . Hypothyroidism   . IBS (irritable bowel syndrome)   . Liver cyst   . Mallet finger of left hand    small finger   Past Surgical History:  Procedure Laterality Date  . CARDIAC CATHETERIZATION  04/1999   per pt normal  . CARDIOVASCULAR STRESS TEST  02-23-2011  dr Chrissie Noa [facility])   normal nuclear study/  no  ischemia/  ef 64%  . CATARACT EXTRACTION W/ INTRAOCULAR LENS  IMPLANT, BILATERAL  2003   Bil  . CESAREAN SECTION  1984  . CLOSED REDUCTION FINGER WITH PERCUTANEOUS PINNING Left 04/22/2014   Procedure: LEFT SMALL FINGER CLOSED REDUCTION PINNING;  Surgeon: Linna Hoff, MD;  Location: [facility];  Service: Orthopedics;  Laterality: Left;  . COLONOSCOPY  11/2002  . LAPAROSCOPIC CHOLECYSTECTOMY  03-18-2000  . TRANSTHORACIC ECHOCARDIOGRAM  02-23-2011   normal lvf/  ef 58%/  mild rve/   mild lae/  mild  mr  & tr/  mild pulmonary htn  . WISDOM TOOTH EXTRACTION     Social History   Tobacco Use  . Smoking status: Never Smoker  . Smokeless tobacco: Never Used  Substance Use Topics  . Alcohol use: No    Alcohol/week: 0.0 standard drinks  . Drug use: No   Family History  Problem Relation Age of Onset  . Stroke Mother   . Heart disease Mother   . Hypertension Father   . Diabetes Father   . Hyperlipidemia Father   . Diabetes Brother    Allergies  Allergen Reactions  . Xylocaine [Lidocaine Hcl] Swelling    ALLERGIC TO ALL CAINES EXCEPT SENSORCAINE , AND MARCAINE  . Codeine Nausea And Vomiting    REACTION: vomits blood  . Lamictal [Lamotrigine] Other (See Comments)    REACTION: sores in mouth, muscle aches and also made her manic bipolar  . Lithium Swelling  . Tegretol [Carbamazepine] Other (See Comments)    REACTION: skin crawls   Current Outpatient Medications on File Prior to Visit  Medication Sig Dispense Refill  . ARIPiprazole (ABILIFY) 30 MG tablet Take 30 mg by mouth daily.      Marland Kitchen aspirin 81 MG tablet Take 81 mg by mouth daily.      . Biotin w/ Vitamins C & E (HAIR/SKIN/NAILS PO) Take 1 tablet by mouth daily.    Marland Kitchen buPROPion (WELLBUTRIN XL) 150 MG 24 hr tablet Take three tablets by mouth every morning    . cetirizine (ZYRTEC) 10 MG tablet Take 10 mg by mouth daily as needed for allergies.    . Cholecalciferol (VITAMIN D) 2000 UNITS CAPS Take 1 capsule by mouth  daily.      . clonazePAM (KLONOPIN) 1 MG tablet Take 1 mg by mouth at bedtime as needed.      . Coenzyme Q10 (COQ10 PO) Take 100 mg by mouth daily.    . fluticasone (FLONASE) 50 MCG/ACT nasal spray Place 2 sprays into both nostrils daily. 48 g 3  . lansoprazole (  PREVACID) 30 MG capsule TAKE 1 CAPSULE EVERY DAY 90 capsule 0  . levothyroxine (SYNTHROID) 50 MCG tablet TAKE 1 TABLET EVERY DAY 90 tablet 0  . lovastatin (MEVACOR) 40 MG tablet TAKE 1 TABLET EVERY DAY 90 tablet 0  . Multiple Vitamin (MULTIVITAMIN) tablet Take 1 tablet by mouth daily.      Marland Kitchen tiZANidine (ZANAFLEX) 4 MG tablet Take 1 tablet by mouth at bedtime as needed.  0  . zolpidem (AMBIEN) 10 MG tablet Take 10 mg by mouth at bedtime.      No current facility-administered medications on file prior to visit.      Review of Systems  Constitutional: Negative for activity change, appetite change, fatigue, fever and unexpected weight change.  HENT: Negative for congestion, ear pain, rhinorrhea, sinus pressure and sore throat.   Eyes: Negative for pain, redness and visual disturbance.  Respiratory: Negative for cough, shortness of breath and wheezing.   Cardiovascular: Negative for chest pain and palpitations.  Gastrointestinal: Negative for abdominal pain, blood in stool, constipation and diarrhea.  Endocrine: Negative for polydipsia and polyuria.  Genitourinary: Negative for dysuria, frequency and urgency.  Musculoskeletal: Positive for arthralgias. Negative for back pain and myalgias.       Knee pain R with exercise   Skin: Negative for pallor and rash.  Allergic/Immunologic: Negative for environmental allergies.  Neurological: Negative for dizziness, syncope and headaches.  Hematological: Negative for adenopathy. Does not bruise/bleed easily.  Psychiatric/Behavioral: Negative for decreased concentration and dysphoric mood. The patient is not nervous/anxious.        Objective:   Physical Exam Constitutional:      General:  She is not in acute distress.    Appearance: Normal appearance. She is well-developed. She is obese. She is not ill-appearing or diaphoretic.  HENT:     Head: Normocephalic and atraumatic.     Right Ear: Tympanic membrane, ear canal and external ear normal.     Left Ear: Tympanic membrane, ear canal and external ear normal.     Mouth/Throat:     Mouth: Mucous membranes are moist.     Pharynx: Oropharynx is clear. No posterior oropharyngeal erythema.  Eyes:     General: No scleral icterus.    Conjunctiva/sclera: Conjunctivae normal.     Pupils: Pupils are equal, round, and reactive to light.  Neck:     Musculoskeletal: Normal range of motion and neck supple.     Thyroid: No thyromegaly.     Vascular: No carotid bruit or JVD.  Cardiovascular:     Rate and Rhythm: Normal rate and regular rhythm.     Heart sounds: Normal heart sounds. No gallop.   Pulmonary:     Effort: Pulmonary effort is normal. No respiratory distress.     Breath sounds: Normal breath sounds. No wheezing or rales.  Chest:     Chest wall: No tenderness.  Abdominal:     General: Bowel sounds are normal. There is no distension or abdominal bruit.     Palpations: Abdomen is soft. There is no mass.     Tenderness: There is no abdominal tenderness.  Genitourinary:    Comments: Breast exam: No mass, nodules, thickening, tenderness, bulging, retraction, inflamation, nipple discharge or skin changes noted.  No axillary or clavicular LA.             Anus appears normal w/o hemorrhoids or masses     External genitalia : nl appearance and hair distribution/no lesions     Urethral meatus :  nl size, no lesions or prolapse     Urethra: no masses, tenderness or scarring    Bladder : no masses or tenderness     Vagina: nl general appearance, scant cloudy d/c,no lesions, no significant cystocele  or rectocele     Cervix: no lesions/ discharge or friability (did not see fully-difficult due to habitus)      Uterus: nl size, contour, position, and mobility (not fixed) , non tender    Adnexa : no masses, tenderness, enlargement or nodularity       Musculoskeletal: Normal range of motion.        General: No tenderness.     Right lower leg: No edema.     Left lower leg: No edema.  Lymphadenopathy:     Cervical: No cervical adenopathy.  Skin:    General: Skin is warm and dry.     Coloration: Skin is not pale.     Findings: No erythema or rash.     Comments: Solar lentigines diffusely   Neurological:     Mental Status: She is alert. Mental status is at baseline.     Cranial Nerves: No cranial nerve deficit.     Motor: No abnormal muscle tone.     Coordination: Coordination normal.     Deep Tendon Reflexes: Reflexes are normal and symmetric. Reflexes normal.     Comments: Baseline hand tremor   Psychiatric:        Mood and Affect: Mood normal.     Comments: Pleasant            Assessment & Plan:   Problem List Items Addressed This Visit      Endocrine   Hypothyroidism    Hypothyroidism  Pt has no clinical changes No change in energy level/ hair or skin/ edema and no tremor Lab Results  Component Value Date   TSH 3.14 05/08/2019            Other   HYPERCHOLESTEROLEMIA, PURE    Continues lovastatin  Stable Disc goals for lipids and reasons to control them Rev last labs with pt Rev low sat fat diet in detail       Morbid obesity ([place])    Discussed how this problem influences overall health and the risks it imposes  Reviewed plan for weight loss with lower calorie diet (via better food choices and also portion control or program like weight watchers) and exercise building up to or more than 30 minutes 5 days per week including some aerobic activity         Bipolar disorder ([place])    More problems with anxiety during the pandemic  Continues to see Dr Lajoyce Corners Ret[place]  Will send her a copy of latest labs      Encounter for routine gynecological examination     Exam done  No abnormalities Pap result pending       Relevant Orders   Cytology - PAP([place])   Routine general medical examination at a health care facility - Primary    Reviewed health habits including diet and exercise and skin cancer prevention Reviewed appropriate screening tests for age  Also reviewed health mt list, fam hx and immunization status , as well as social and family history   See HPI Labs rev Gyn exam done today with pap  Pt will call to schedule her mammogram       Elevated glucose level    Lab Results  Component Value Date   HGBA1C 5.7  05/08/2019   disc imp of low glycemic diet and wt loss to prevent DM2

## 2019-05-14 NOTE — Assessment & Plan Note (Signed)
Reviewed health habits including diet and exercise and skin cancer prevention Reviewed appropriate screening tests for age  Also reviewed health mt list, fam hx and immunization status , as well as social and family history   See HPI Labs rev Gyn exam done today with pap  Pt will call to schedule her mammogram

## 2019-05-14 NOTE — Assessment & Plan Note (Signed)
Hypothyroidism  Pt has no clinical changes No change in energy level/ hair or skin/ edema and no tremor Lab Results  Component Value Date   TSH 3.14 05/08/2019

## 2019-05-14 NOTE — Assessment & Plan Note (Signed)
Exam done  No abnormalities Pap result pending

## 2019-05-14 NOTE — Assessment & Plan Note (Signed)
Lab Results  Component Value Date   HGBA1C 5.7 05/08/2019   disc imp of low glycemic diet and wt loss to prevent DM2

## 2019-05-14 NOTE — Assessment & Plan Note (Signed)
More problems with anxiety during the pandemic  Continues to see Dr Lajoyce Corners /psychiatry  Will send her a copy of latest labs

## 2019-05-19 LAB — CYTOLOGY - PAP
Diagnosis: NEGATIVE
HPV: NOT DETECTED

## 2019-06-02 ENCOUNTER — Ambulatory Visit (INDEPENDENT_AMBULATORY_CARE_PROVIDER_SITE_OTHER): Payer: Medicare HMO | Admitting: Psychology

## 2019-06-02 DIAGNOSIS — F3132 Bipolar disorder, current episode depressed, moderate: Secondary | ICD-10-CM | POA: Diagnosis not present

## 2019-06-05 ENCOUNTER — Other Ambulatory Visit: Payer: Self-pay | Admitting: Family Medicine

## 2019-06-10 ENCOUNTER — Other Ambulatory Visit: Payer: Self-pay | Admitting: Family Medicine

## 2019-06-24 ENCOUNTER — Other Ambulatory Visit: Payer: Self-pay | Admitting: Family Medicine

## 2019-06-30 ENCOUNTER — Ambulatory Visit (INDEPENDENT_AMBULATORY_CARE_PROVIDER_SITE_OTHER): Payer: Medicare HMO | Admitting: Psychology

## 2019-06-30 DIAGNOSIS — F3132 Bipolar disorder, current episode depressed, moderate: Secondary | ICD-10-CM

## 2019-07-02 ENCOUNTER — Other Ambulatory Visit: Payer: Self-pay

## 2019-07-02 ENCOUNTER — Ambulatory Visit
Admission: RE | Admit: 2019-07-02 | Discharge: 2019-07-02 | Disposition: A | Discharge status: Home / Self Care | Payer: Medicare HMO | Source: Ambulatory Visit | Attending: Family Medicine | Admitting: Family Medicine

## 2019-07-02 DIAGNOSIS — Z1231 Encounter for screening mammogram for malignant neoplasm of breast: Secondary | ICD-10-CM

## 2019-07-23 ENCOUNTER — Telehealth: Payer: Self-pay | Admitting: *Deleted

## 2019-07-23 NOTE — Telephone Encounter (Signed)
Pt left a VM at triage. Pt is requesting a letter to go to the [facility]. They are having limited access and you have to have an appt to go work out,  but are opening up for people to work out that have a letter of approval from their doctor. Pt is requesting Dr. Glori Bickers to write a letter approving her to start working out at the [facility]

## 2019-07-23 NOTE — Telephone Encounter (Signed)
I do not think it is at all safe to work out in a gym currently.  (very high risk of covid transmission)- I have already diagnosed one person with covid who got it at a gym

## 2019-07-24 ENCOUNTER — Other Ambulatory Visit: Payer: Self-pay | Admitting: Family Medicine

## 2019-07-24 NOTE — Telephone Encounter (Signed)
Pt notified of Dr. Marliss Coots comments and recommendations

## 2019-07-28 ENCOUNTER — Ambulatory Visit (INDEPENDENT_AMBULATORY_CARE_PROVIDER_SITE_OTHER): Payer: Medicare HMO | Admitting: Psychology

## 2019-07-28 DIAGNOSIS — F3132 Bipolar disorder, current episode depressed, moderate: Secondary | ICD-10-CM | POA: Diagnosis not present

## 2019-08-03 DIAGNOSIS — D1800 Hemangioma unspecified site: Secondary | ICD-10-CM | POA: Diagnosis not present

## 2019-08-03 DIAGNOSIS — L219 Seborrheic dermatitis, unspecified: Secondary | ICD-10-CM | POA: Diagnosis not present

## 2019-08-03 DIAGNOSIS — D485 Neoplasm of uncertain behavior of skin: Secondary | ICD-10-CM | POA: Diagnosis not present

## 2019-08-03 DIAGNOSIS — D224 Melanocytic nevi of scalp and neck: Secondary | ICD-10-CM | POA: Diagnosis not present

## 2019-08-03 DIAGNOSIS — D2230 Melanocytic nevi of unspecified part of face: Secondary | ICD-10-CM | POA: Diagnosis not present

## 2019-08-03 DIAGNOSIS — D2260 Melanocytic nevi of unspecified upper limb, including shoulder: Secondary | ICD-10-CM | POA: Diagnosis not present

## 2019-08-03 DIAGNOSIS — L820 Inflamed seborrheic keratosis: Secondary | ICD-10-CM | POA: Diagnosis not present

## 2019-08-03 DIAGNOSIS — Z1283 Encounter for screening for malignant neoplasm of skin: Secondary | ICD-10-CM | POA: Diagnosis not present

## 2019-08-03 DIAGNOSIS — L810 Postinflammatory hyperpigmentation: Secondary | ICD-10-CM | POA: Diagnosis not present

## 2019-08-03 DIAGNOSIS — D225 Melanocytic nevi of trunk: Secondary | ICD-10-CM | POA: Diagnosis not present

## 2019-08-07 ENCOUNTER — Ambulatory Visit (INDEPENDENT_AMBULATORY_CARE_PROVIDER_SITE_OTHER): Payer: Medicare HMO

## 2019-08-07 DIAGNOSIS — Z23 Encounter for immunization: Secondary | ICD-10-CM

## 2019-08-25 ENCOUNTER — Ambulatory Visit (INDEPENDENT_AMBULATORY_CARE_PROVIDER_SITE_OTHER): Payer: Medicare HMO | Admitting: Psychology

## 2019-08-25 DIAGNOSIS — F3132 Bipolar disorder, current episode depressed, moderate: Secondary | ICD-10-CM

## 2019-09-24 ENCOUNTER — Ambulatory Visit (INDEPENDENT_AMBULATORY_CARE_PROVIDER_SITE_OTHER): Payer: Medicare HMO | Admitting: Family Medicine

## 2019-09-24 ENCOUNTER — Encounter: Payer: Self-pay | Admitting: Family Medicine

## 2019-09-24 ENCOUNTER — Ambulatory Visit (INDEPENDENT_AMBULATORY_CARE_PROVIDER_SITE_OTHER): Payer: Medicare HMO | Admitting: Psychology

## 2019-09-24 VITALS — Temp 97.5°F | Ht 60.5 in | Wt 221.3 lb

## 2019-09-24 DIAGNOSIS — K529 Noninfective gastroenteritis and colitis, unspecified: Secondary | ICD-10-CM | POA: Diagnosis not present

## 2019-09-24 DIAGNOSIS — R1013 Epigastric pain: Secondary | ICD-10-CM | POA: Insufficient documentation

## 2019-09-24 DIAGNOSIS — F3132 Bipolar disorder, current episode depressed, moderate: Secondary | ICD-10-CM

## 2019-09-24 DIAGNOSIS — K921 Melena: Secondary | ICD-10-CM | POA: Diagnosis not present

## 2019-09-24 NOTE — Progress Notes (Signed)
VIRTUAL VISIT Due to national recommendations of social distancing due to [place] 19, a virtual visit is felt to be most appropriate for this patient at this time.   I connected with the patient on 09/24/19 at  4:00 PM EDT by virtual telehealth platform and verified that I am speaking with the correct person using two identifiers.   I discussed the limitations, risks, security and privacy concerns of performing an evaluation and management service by  virtual telehealth platform and the availability of in person appointments. I also discussed with the patient that there may be a patient responsible charge related to this service. The patient expressed understanding and agreed to proceed.  Patient location: Home Provider Location: [facility] Participants: Eliezer Lofts and Lavonia Dana   Chief Complaint  Patient presents with  . Diarrhea    off and on-Traveled to [place] and [place]  . Melena    Saturday  . Green Stools    Sunday-but now back normal color diarrhea    History of Present Illness: 63 year old female patient of Dr. Marliss Coots with history of  Chronic diarrhea for last year, intermittently.   Started with episode of Diarrhea after trip to [place] and [place]... not well water. Daily watery.. no blood in stool.  Was not worried until 1 week ago.. bent over .. sharp pain in right  mid abdomen under rib cage.. lasted 10 min.  No N/V.  no recent antibiotic. The following morning noted very black diarrhea.  Not frequency of stool.  next day BM was greenish, then next day back to normal.  No further abdominal pain.  no heartburn... prevacid controls this.  Now stools are firmer.. but not normal.   on daily baby aspirin.      Colonsocopy 5 years ago normal.  COVID 19 screen No recent travel or known exposure to [place] The patient denies respiratory symptoms of COVID 19 at this time.  The importance of social distancing was  discussed today.   Review of Systems  Constitutional: Negative for chills and fever.  HENT: Negative for congestion and ear pain.   Eyes: Negative for pain and redness.  Respiratory: Negative for cough and shortness of breath.   Cardiovascular: Negative for chest pain, palpitations and leg swelling.  Gastrointestinal: Positive for abdominal pain and diarrhea. Negative for blood in stool, constipation, nausea and vomiting.  Genitourinary: Negative for dysuria.  Musculoskeletal: Negative for falls and myalgias.  Skin: Negative for rash.  Neurological: Negative for dizziness.  Psychiatric/Behavioral: Negative for depression. The patient is not nervous/anxious.       Past Medical History:  Diagnosis Date  . Allergic rhinitis   . Bipolar 1 disorder ([place])   . Fissure, anal   . GERD (gastroesophageal reflux disease)   . History of basal cell carcinoma excision    ON FACE  . History of squamous cell carcinoma excision    ON RIGHT LEG  . Hyperlipidemia   . Hypothyroidism   . IBS (irritable bowel syndrome)   . Liver cyst   . Mallet finger of left hand    small finger    reports that she has never smoked. She has never used smokeless tobacco. She reports that she does not drink alcohol or use drugs.   Current Outpatient Medications:  .  ARIPiprazole (ABILIFY) 30 MG tablet, Take 30 mg by mouth daily.  , Disp: , Rfl:  .  aspirin 81 MG tablet, Take 81 mg by  mouth daily.  , Disp: , Rfl:  .  Biotin w/ Vitamins C & E (HAIR/SKIN/NAILS PO), Take 1 tablet by mouth daily., Disp: , Rfl:  .  buPROPion (WELLBUTRIN XL) 150 MG 24 hr tablet, Take three tablets by mouth every morning, Disp: , Rfl:  .  cetirizine (ZYRTEC) 10 MG tablet, Take 10 mg by mouth daily as needed for allergies., Disp: , Rfl:  .  Cholecalciferol (VITAMIN D) 2000 UNITS CAPS, Take 1 capsule by mouth daily.  , Disp: , Rfl:  .  clonazePAM (KLONOPIN) 1 MG tablet, Take 1 mg by mouth at bedtime as needed.  , Disp: , Rfl:  .  Coenzyme  Q10 (COQ10 PO), Take 100 mg by mouth daily., Disp: , Rfl:  .  fluticasone (FLONASE) 50 MCG/ACT nasal spray, Place 2 sprays into both nostrils daily., Disp: 48 g, Rfl: 3 .  lansoprazole (PREVACID) 30 MG capsule, TAKE 1 CAPSULE EVERY DAY, Disp: 90 capsule, Rfl: 1 .  levothyroxine (SYNTHROID) 50 MCG tablet, TAKE 1 TABLET EVERY DAY, Disp: 90 tablet, Rfl: 3 .  lovastatin (MEVACOR) 40 MG tablet, TAKE 1 TABLET EVERY DAY, Disp: 90 tablet, Rfl: 2 .  Multiple Vitamin (MULTIVITAMIN) tablet, Take 1 tablet by mouth daily.  , Disp: , Rfl:  .  tiZANidine (ZANAFLEX) 4 MG tablet, Take 1 tablet by mouth at bedtime as needed., Disp: , Rfl: 0 .  zolpidem (AMBIEN) 10 MG tablet, Take 10 mg by mouth at bedtime. , Disp: , Rfl:    Observations/Objective: Temperature (!) 97.5 F (36.4 C), temperature source Oral, height 5' 0.5" (1.537 m), weight 221 lb 5 oz (100.4 kg).  Physical Exam  Physical Exam Constitutional:      General: The patient is not in acute distress. Pulmonary:     Effort: Pulmonary effort is normal. No respiratory distress.  Neurological:     Mental Status: The patient is alert and oriented to person, place, and time.  Psychiatric:        Mood and Affect: Mood normal.        Behavior: Behavior normal.   Assessment and Plan   Recent episode pf shortlived epigastric abdominal pain followed by melena:  Upper GI bleed, possibly gastritis/PUD with self-limited bleed. Now resolved.  Pt on ASA.  Increase prevacid for 3-4 days, if recurs further eval/treatment needed.  Chronic diarrhea: long term.. minimal.. likely due to IBS diarrhea predominant but if flare continues.. consider further eval with labs and GI pathogen panel given recent travel.   I discussed the assessment and treatment plan with the patient. The patient was provided an opportunity to ask questions and all were answered. The patient agreed with the plan and demonstrated an understanding of the instructions.   The patient was advised  to call back or seek an in-person evaluation if the symptoms worsen or if the condition fails to improve as anticipated.     Eliezer Lofts, MD

## 2019-09-24 NOTE — Patient Instructions (Signed)
Avoid spicy foods, double up on the prevacid.  Call for stool testing if diarrhea does not continue to resolve. Call if further dark stools.

## 2019-10-20 ENCOUNTER — Ambulatory Visit (INDEPENDENT_AMBULATORY_CARE_PROVIDER_SITE_OTHER): Payer: Medicare HMO | Admitting: Psychology

## 2019-10-20 DIAGNOSIS — F3132 Bipolar disorder, current episode depressed, moderate: Secondary | ICD-10-CM | POA: Diagnosis not present

## 2019-11-17 ENCOUNTER — Ambulatory Visit: Payer: Medicare HMO | Admitting: Psychology

## 2019-11-24 ENCOUNTER — Ambulatory Visit (INDEPENDENT_AMBULATORY_CARE_PROVIDER_SITE_OTHER): Payer: Medicare HMO | Admitting: Psychology

## 2019-11-24 DIAGNOSIS — F3132 Bipolar disorder, current episode depressed, moderate: Secondary | ICD-10-CM

## 2019-12-15 ENCOUNTER — Ambulatory Visit: Payer: Medicare HMO | Admitting: Psychology

## 2020-01-07 DIAGNOSIS — H524 Presbyopia: Secondary | ICD-10-CM | POA: Diagnosis not present

## 2020-01-07 DIAGNOSIS — Z0100 Encounter for examination of eyes and vision without abnormal findings: Secondary | ICD-10-CM | POA: Diagnosis not present

## 2020-01-12 ENCOUNTER — Ambulatory Visit (INDEPENDENT_AMBULATORY_CARE_PROVIDER_SITE_OTHER): Payer: Medicare HMO | Admitting: Psychology

## 2020-01-12 DIAGNOSIS — F321 Major depressive disorder, single episode, moderate: Secondary | ICD-10-CM

## 2020-01-12 DIAGNOSIS — F4323 Adjustment disorder with mixed anxiety and depressed mood: Secondary | ICD-10-CM | POA: Diagnosis not present

## 2020-01-12 DIAGNOSIS — F411 Generalized anxiety disorder: Secondary | ICD-10-CM

## 2020-01-16 ENCOUNTER — Other Ambulatory Visit: Payer: Self-pay | Admitting: Family Medicine

## 2020-01-19 DIAGNOSIS — R0602 Shortness of breath: Secondary | ICD-10-CM | POA: Diagnosis not present

## 2020-01-19 DIAGNOSIS — R079 Chest pain, unspecified: Secondary | ICD-10-CM | POA: Diagnosis not present

## 2020-01-19 DIAGNOSIS — R0600 Dyspnea, unspecified: Secondary | ICD-10-CM | POA: Insufficient documentation

## 2020-01-19 DIAGNOSIS — E7800 Pure hypercholesterolemia, unspecified: Secondary | ICD-10-CM | POA: Diagnosis not present

## 2020-01-25 ENCOUNTER — Encounter: Payer: Self-pay | Admitting: Family Medicine

## 2020-01-25 ENCOUNTER — Ambulatory Visit (INDEPENDENT_AMBULATORY_CARE_PROVIDER_SITE_OTHER): Payer: Medicare HMO | Admitting: Family Medicine

## 2020-01-25 ENCOUNTER — Other Ambulatory Visit: Payer: Self-pay

## 2020-01-25 VITALS — BP 118/76 | HR 67 | Temp 98.2°F | Ht 60.5 in | Wt 222.8 lb

## 2020-01-25 DIAGNOSIS — R109 Unspecified abdominal pain: Secondary | ICD-10-CM | POA: Diagnosis not present

## 2020-01-25 DIAGNOSIS — R2232 Localized swelling, mass and lump, left upper limb: Secondary | ICD-10-CM | POA: Diagnosis not present

## 2020-01-25 DIAGNOSIS — S39012A Strain of muscle, fascia and tendon of lower back, initial encounter: Secondary | ICD-10-CM

## 2020-01-25 LAB — POCT URINALYSIS DIPSTICK
Bilirubin, UA: NEGATIVE
Blood, UA: NEGATIVE
Glucose, UA: NEGATIVE
Ketones, UA: NEGATIVE
Leukocytes, UA: NEGATIVE
Nitrite, UA: NEGATIVE
Protein, UA: NEGATIVE
Spec Grav, UA: 1.025 (ref 1.010–1.025)
Urobilinogen, UA: 0.2 E.U./dL
pH, UA: 5.5 (ref 5.0–8.0)

## 2020-01-25 NOTE — Progress Notes (Signed)
Subjective:    Patient ID: [name] [name], female    DOB: [birth year], 64 y.o.   MRN: [id number]  HPI Chief Complaint  Patient presents with  . Flank Pain    Pt c/o right flank pain. Denies urgency or frequency of urination. no other symptoms.    This is a 64 yo female who presents today for above cc. Pain started 3 days ago. Prior to pain starting, she participated in [employer] and did an arm pulling exercise with weight. Felt a little discomfort immediately after work out and it was worse the next morning. Pain worse with reaching of right arm. She has taken Tylenol this am at 6 am with some relief. Pain gets worse as day goes on. Pain has improved some over last couple of days. No bowel/ bladder changes. No history of kidney stones. No interference with sleep. No radiation down legs, no weakness.    Also complains of knot of left thumb. Only hurts if she presses on it. Not sure how long it has been there.     Review of Systems Per HPI    Objective:   Physical Exam Vitals reviewed.  Constitutional:      General: She is not in acute distress.    Appearance: Normal appearance. She is obese. She is not ill-appearing, toxic-appearing or diaphoretic.  HENT:     Head: Normocephalic and atraumatic.  Eyes:     Conjunctiva/sclera: Conjunctivae normal.  Cardiovascular:     Rate and Rhythm: Normal rate and regular rhythm.  Pulmonary:     Effort: Pulmonary effort is normal.  Musculoskeletal:     Left hand: Deformity (small, hard nodule thumb medial dip. ) and tenderness present. Normal range of motion. Normal strength.     Cervical back: Normal, normal range of motion and neck supple. No tenderness.     Thoracic back: Normal.     Lumbar back: Tenderness (left paraspinal) present. No swelling, edema or deformity. Normal range of motion.     Comments: Normal gait. UE/LE strength 5/5. Pain with twisting to left.   Skin:    General: Skin is warm and dry.     Findings: No rash.    Neurological:     Mental Status: She is alert and oriented to person, place, and time.  Psychiatric:        Mood and Affect: Mood normal.        Behavior: Behavior normal.        Thought Content: Thought content normal.        Judgment: Judgment normal.       BP 118/76 (BP Location: Right Arm, Patient Position: Sitting, Cuff Size: Large)   Pulse 67   Temp 98.2 F (36.8 C) (Temporal)   Ht 5' 0.5" (1.537 m)   Wt 222 lb 12.8 oz (101.1 kg)   SpO2 99%   BMI 42.80 kg/m  Wt Readings from Last 3 Encounters:  01/25/20 222 lb 12.8 oz (101.1 kg)  09/24/19 221 lb 5 oz (100.4 kg)  05/14/19 223 lb 5 oz (101.3 kg)   Results for orders placed or performed in visit on 01/25/20  Urinalysis Dipstick  Result Value Ref Range   Color, UA light yellow    Clarity, UA clear    Glucose, UA Negative Negative   Bilirubin, UA neg    Ketones, UA neg    Spec Grav, UA 1.025 1.010 - 1.025   Blood, UA neg    pH, UA 5.5 5.0 -  8.0   Protein, UA Negative Negative   Urobilinogen, UA 0.2 0.2 or 1.0 E.U./dL   Nitrite, UA neg    Leukocytes, UA Negative Negative   Appearance     Odor         Assessment & Plan:  1. Flank pain - urinalysis without concerning findings - Urinalysis Dipstick  2. Strain of lumbar region, initial encounter - discussed diagnosis with patient - continue exercise as tolerated, Tylenol 2 tabs every 8-12 hours prn, heat prn, gentle ROM several times a day - follow up precautions reviewed- worsening pain, numbness/tingling/weakness  3. Subcutaneous nodule of left thumb - discussed referral to hand surgeon if worsening pain or any decreased ROM  This visit occurred during the SARS-CoV-2 public health emergency.  Safety protocols were in place, including screening questions prior to the visit, additional usage of staff PPE, and extensive cleaning of exam room while observing appropriate contact time as indicated for disinfecting solutions.    Clarene Reamer,  FNP-BC  [facility]  01/25/2020 11:48 AM

## 2020-01-25 NOTE — Patient Instructions (Signed)
Good to see you today  Do gentle stretches, use heat as needed, can also use tylenol, 2 tablets every 8 to 12 hours.   Continue to do your Silver Sneakers-avoid activity that makes pain worse

## 2020-01-27 ENCOUNTER — Ambulatory Visit: Payer: Medicare HMO | Admitting: Family Medicine

## 2020-01-29 ENCOUNTER — Other Ambulatory Visit: Payer: Self-pay | Admitting: Family Medicine

## 2020-02-04 DIAGNOSIS — R0602 Shortness of breath: Secondary | ICD-10-CM | POA: Diagnosis not present

## 2020-02-23 ENCOUNTER — Ambulatory Visit (INDEPENDENT_AMBULATORY_CARE_PROVIDER_SITE_OTHER): Payer: Medicare HMO | Admitting: Psychology

## 2020-02-23 DIAGNOSIS — F3132 Bipolar disorder, current episode depressed, moderate: Secondary | ICD-10-CM

## 2020-02-25 ENCOUNTER — Encounter: Payer: Self-pay | Admitting: Family Medicine

## 2020-02-25 ENCOUNTER — Other Ambulatory Visit: Payer: Self-pay

## 2020-02-25 ENCOUNTER — Ambulatory Visit (INDEPENDENT_AMBULATORY_CARE_PROVIDER_SITE_OTHER): Payer: Medicare HMO | Admitting: Family Medicine

## 2020-02-25 VITALS — BP 120/74 | HR 62 | Temp 96.9°F | Ht 60.5 in | Wt 219.1 lb

## 2020-02-25 DIAGNOSIS — F3177 Bipolar disorder, in partial remission, most recent episode mixed: Secondary | ICD-10-CM | POA: Diagnosis not present

## 2020-02-25 DIAGNOSIS — M546 Pain in thoracic spine: Secondary | ICD-10-CM | POA: Diagnosis not present

## 2020-02-25 DIAGNOSIS — G8929 Other chronic pain: Secondary | ICD-10-CM

## 2020-02-25 DIAGNOSIS — M545 Low back pain, unspecified: Secondary | ICD-10-CM

## 2020-02-25 MED ORDER — FLUTICASONE PROPIONATE 50 MCG/ACT NA SUSP: 2.0000 | Freq: Every day | NASAL | 3 refills | Status: DC | PRN
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
## 2020-02-25 NOTE — Assessment & Plan Note (Signed)
Without any radiculopathy now  Mainly radiates up back to R scapula area (also has TS pain) H/o scoliosis and morbid obesity  Also some OA Exercises daily  Reassuring exam  inst to continue tizanadine and acetaminophen prn  Ref made to PT

## 2020-02-25 NOTE — Assessment & Plan Note (Signed)
Worse than in the past- exacerbated by standing/cooking/doing dishes but exercise classes don't bother her  R scapula pain but nl rom of shoulder  Rev past xr showing scoliosis and OA  Ref to PT for eval and tx  If no imp would consider orthopedic ref

## 2020-02-25 NOTE — Assessment & Plan Note (Signed)
Stable with current medications under psychiatric care

## 2020-02-25 NOTE — Patient Instructions (Signed)
I placed a physical therapy referral - our office will call you to set that up   Use intermittent ice and heat on areas that hurt Tylenol and tizanidine are helpful   If PT does not help we will get you to orthopedics      Our providers are recommending the vaccine to all our patients, especially if you are a patient who has medical conditions that make you high-risk for serious COVID disease.    As of right now there is NO known contraindication to getting the COVID vaccine. This means that everyone should be able to get this vaccine, even if they have other medical conditions or are pregnant. Side effects are always possible, but as with all vaccines, we believe the benefits outweigh the risks.    It is important to remember that when people are having "reactions" to the vaccine, these reactions are almost always NORMAL immune system responses. It is ok to take Benadryl/diphenhydramine for these symptoms. If you are having any trouble breathing and/or feel like your airway is swelling, go to an urgent care or emergency department. Very few people are having immune responses so strong they need to go to the emergency department, but many people are not feeling well for 1 to 2 days after the vaccine is given.   The CDC states that if you have had an allergic reaction to polyethylene glycol (PEG) or polysorbate you should NOT get an mRNA COVID-19 vaccine.  Polysorbate is not an ingredient in either mRNA COVID-19 vaccine but is closely related to PEG, which is in the vaccines.   If you have any questions about adverse reactions related to COVID-19 vaccines, please visit [email or web address] or set up a virtual visit with your primary care provider.     For questions related to COVID-19 vaccine distribution or appointments, please visit [email or web address].

## 2020-02-25 NOTE — Assessment & Plan Note (Signed)
Discussed how this problem influences overall health and the risks it imposes  Reviewed plan for weight loss with lower calorie diet (via better food choices and also portion control or program like weight watchers) and exercise building up to or more than 30 minutes 5 days per week including some aerobic activity   Commended on wt loss so far  

## 2020-02-26 DIAGNOSIS — R0602 Shortness of breath: Secondary | ICD-10-CM | POA: Diagnosis not present

## 2020-02-26 DIAGNOSIS — R079 Chest pain, unspecified: Secondary | ICD-10-CM | POA: Diagnosis not present

## 2020-02-27 ENCOUNTER — Ambulatory Visit: Payer: Medicare HMO | Attending: Internal Medicine

## 2020-02-27 ENCOUNTER — Other Ambulatory Visit: Payer: Self-pay

## 2020-02-27 DIAGNOSIS — Z23 Encounter for immunization: Secondary | ICD-10-CM

## 2020-02-27 NOTE — Progress Notes (Signed)
   Covid-19 Vaccination Clinic  Name:  [name] [name]    MRN: [id number] DOB: 02/07/1956  02/27/2020  [name] [name] was observed post Covid-19 immunization for 15 minutes without incident. She was provided with Vaccine Information Sheet and instruction to access the V-Safe system.   [name] [name] was instructed to call 911 with any severe reactions post vaccine: Marland Kitchen Difficulty breathing  . Swelling of face and throat  . A fast heartbeat  . A bad rash all over body  . Dizziness and weakness   Immunizations Administered    Name Date Dose VIS Date Route   Pfizer COVID-19 Vaccine 02/27/2020  1:57 PM 0.3 mL 11/20/2019 Intramuscular   Manufacturer: [employer], [employer]   Lot: [id number]   [place]: [id number]

## 2020-02-29 DIAGNOSIS — R0602 Shortness of breath: Secondary | ICD-10-CM | POA: Diagnosis not present

## 2020-03-01 ENCOUNTER — Telehealth: Payer: Self-pay | Admitting: Family Medicine

## 2020-03-01 DIAGNOSIS — M546 Pain in thoracic spine: Secondary | ICD-10-CM

## 2020-03-01 DIAGNOSIS — G8929 Other chronic pain: Secondary | ICD-10-CM

## 2020-03-01 DIAGNOSIS — M5137 Other intervertebral disc degeneration, lumbosacral region: Secondary | ICD-10-CM

## 2020-03-01 NOTE — Telephone Encounter (Signed)
done

## 2020-03-01 NOTE — Telephone Encounter (Signed)
Please place New Referral for PT. Patient requested [facility] and they didn't accept her Insurance plan. She will call [employer] and get the names of where she wants to go.

## 2020-03-07 DIAGNOSIS — M6281 Muscle weakness (generalized): Secondary | ICD-10-CM | POA: Diagnosis not present

## 2020-03-07 DIAGNOSIS — R262 Difficulty in walking, not elsewhere classified: Secondary | ICD-10-CM | POA: Diagnosis not present

## 2020-03-07 DIAGNOSIS — M545 Low back pain: Secondary | ICD-10-CM | POA: Diagnosis not present

## 2020-03-08 ENCOUNTER — Ambulatory Visit: Payer: Medicare HMO | Admitting: Psychology

## 2020-03-15 DIAGNOSIS — M6281 Muscle weakness (generalized): Secondary | ICD-10-CM | POA: Diagnosis not present

## 2020-03-15 DIAGNOSIS — R262 Difficulty in walking, not elsewhere classified: Secondary | ICD-10-CM | POA: Diagnosis not present

## 2020-03-15 DIAGNOSIS — M545 Low back pain: Secondary | ICD-10-CM | POA: Diagnosis not present

## 2020-03-16 DIAGNOSIS — M6281 Muscle weakness (generalized): Secondary | ICD-10-CM | POA: Diagnosis not present

## 2020-03-16 DIAGNOSIS — R262 Difficulty in walking, not elsewhere classified: Secondary | ICD-10-CM | POA: Diagnosis not present

## 2020-03-16 DIAGNOSIS — M545 Low back pain: Secondary | ICD-10-CM | POA: Diagnosis not present

## 2020-03-21 DIAGNOSIS — R262 Difficulty in walking, not elsewhere classified: Secondary | ICD-10-CM | POA: Diagnosis not present

## 2020-03-21 DIAGNOSIS — M545 Low back pain: Secondary | ICD-10-CM | POA: Diagnosis not present

## 2020-03-21 DIAGNOSIS — M6281 Muscle weakness (generalized): Secondary | ICD-10-CM | POA: Diagnosis not present

## 2020-03-23 ENCOUNTER — Ambulatory Visit: Payer: Medicare HMO | Attending: Internal Medicine

## 2020-03-23 DIAGNOSIS — Z23 Encounter for immunization: Secondary | ICD-10-CM

## 2020-03-23 DIAGNOSIS — M545 Low back pain: Secondary | ICD-10-CM | POA: Diagnosis not present

## 2020-03-23 DIAGNOSIS — R262 Difficulty in walking, not elsewhere classified: Secondary | ICD-10-CM | POA: Diagnosis not present

## 2020-03-23 DIAGNOSIS — M6281 Muscle weakness (generalized): Secondary | ICD-10-CM | POA: Diagnosis not present

## 2020-03-23 NOTE — Progress Notes (Signed)
   Covid-19 Vaccination Clinic  Name:  [name] [name]    MRN: [id number] DOB: [birth year]  03/23/2020  [name] was observed post Covid-19 immunization for 15 minutes without incident. She was provided with Vaccine Information Sheet and instruction to access the V-Safe system.   [name] was instructed to call 911 with any severe reactions post vaccine: Marland Kitchen Difficulty breathing  . Swelling of face and throat  . A fast heartbeat  . A bad rash all over body  . Dizziness and weakness   Immunizations Administered    Name Date Dose VIS Date Route   Pfizer COVID-19 Vaccine 03/23/2020 11:59 AM 0.3 mL 11/20/2019 Intramuscular   Manufacturer: [employer], [employer]   Lot: [id number]   [place]: [id number]

## 2020-03-28 DIAGNOSIS — M545 Low back pain: Secondary | ICD-10-CM | POA: Diagnosis not present

## 2020-03-28 DIAGNOSIS — M6281 Muscle weakness (generalized): Secondary | ICD-10-CM | POA: Diagnosis not present

## 2020-03-28 DIAGNOSIS — R262 Difficulty in walking, not elsewhere classified: Secondary | ICD-10-CM | POA: Diagnosis not present

## 2020-03-29 ENCOUNTER — Ambulatory Visit: Payer: Medicare HMO | Admitting: Psychology

## 2020-03-31 DIAGNOSIS — R262 Difficulty in walking, not elsewhere classified: Secondary | ICD-10-CM | POA: Diagnosis not present

## 2020-03-31 DIAGNOSIS — M6281 Muscle weakness (generalized): Secondary | ICD-10-CM | POA: Diagnosis not present

## 2020-03-31 DIAGNOSIS — M545 Low back pain: Secondary | ICD-10-CM | POA: Diagnosis not present

## 2020-04-04 DIAGNOSIS — R262 Difficulty in walking, not elsewhere classified: Secondary | ICD-10-CM | POA: Diagnosis not present

## 2020-04-04 DIAGNOSIS — M545 Low back pain: Secondary | ICD-10-CM | POA: Diagnosis not present

## 2020-04-04 DIAGNOSIS — M6281 Muscle weakness (generalized): Secondary | ICD-10-CM | POA: Diagnosis not present

## 2020-04-05 ENCOUNTER — Ambulatory Visit (INDEPENDENT_AMBULATORY_CARE_PROVIDER_SITE_OTHER): Payer: Medicare HMO | Admitting: Psychology

## 2020-04-05 DIAGNOSIS — F3132 Bipolar disorder, current episode depressed, moderate: Secondary | ICD-10-CM | POA: Diagnosis not present

## 2020-04-07 DIAGNOSIS — M545 Low back pain: Secondary | ICD-10-CM | POA: Diagnosis not present

## 2020-04-07 DIAGNOSIS — M6281 Muscle weakness (generalized): Secondary | ICD-10-CM | POA: Diagnosis not present

## 2020-04-07 DIAGNOSIS — R262 Difficulty in walking, not elsewhere classified: Secondary | ICD-10-CM | POA: Diagnosis not present

## 2020-04-11 DIAGNOSIS — M6281 Muscle weakness (generalized): Secondary | ICD-10-CM | POA: Diagnosis not present

## 2020-04-11 DIAGNOSIS — R262 Difficulty in walking, not elsewhere classified: Secondary | ICD-10-CM | POA: Diagnosis not present

## 2020-04-11 DIAGNOSIS — M545 Low back pain: Secondary | ICD-10-CM | POA: Diagnosis not present

## 2020-04-14 DIAGNOSIS — M6281 Muscle weakness (generalized): Secondary | ICD-10-CM | POA: Diagnosis not present

## 2020-04-14 DIAGNOSIS — R262 Difficulty in walking, not elsewhere classified: Secondary | ICD-10-CM | POA: Diagnosis not present

## 2020-04-14 DIAGNOSIS — M545 Low back pain: Secondary | ICD-10-CM | POA: Diagnosis not present

## 2020-04-18 ENCOUNTER — Encounter: Payer: Medicare HMO | Admitting: Family Medicine

## 2020-04-21 DIAGNOSIS — R262 Difficulty in walking, not elsewhere classified: Secondary | ICD-10-CM | POA: Diagnosis not present

## 2020-04-21 DIAGNOSIS — M6281 Muscle weakness (generalized): Secondary | ICD-10-CM | POA: Diagnosis not present

## 2020-04-21 DIAGNOSIS — M545 Low back pain: Secondary | ICD-10-CM | POA: Diagnosis not present

## 2020-04-25 DIAGNOSIS — M545 Low back pain: Secondary | ICD-10-CM | POA: Diagnosis not present

## 2020-04-25 DIAGNOSIS — R262 Difficulty in walking, not elsewhere classified: Secondary | ICD-10-CM | POA: Diagnosis not present

## 2020-04-25 DIAGNOSIS — M6281 Muscle weakness (generalized): Secondary | ICD-10-CM | POA: Diagnosis not present

## 2020-05-03 ENCOUNTER — Ambulatory Visit (INDEPENDENT_AMBULATORY_CARE_PROVIDER_SITE_OTHER): Payer: Medicare HMO | Admitting: Psychology

## 2020-05-03 DIAGNOSIS — F3132 Bipolar disorder, current episode depressed, moderate: Secondary | ICD-10-CM | POA: Diagnosis not present

## 2020-05-03 DIAGNOSIS — M545 Low back pain: Secondary | ICD-10-CM | POA: Diagnosis not present

## 2020-05-03 DIAGNOSIS — R262 Difficulty in walking, not elsewhere classified: Secondary | ICD-10-CM | POA: Diagnosis not present

## 2020-05-03 DIAGNOSIS — M6281 Muscle weakness (generalized): Secondary | ICD-10-CM | POA: Diagnosis not present

## 2020-05-05 DIAGNOSIS — M6281 Muscle weakness (generalized): Secondary | ICD-10-CM | POA: Diagnosis not present

## 2020-05-05 DIAGNOSIS — R262 Difficulty in walking, not elsewhere classified: Secondary | ICD-10-CM | POA: Diagnosis not present

## 2020-05-05 DIAGNOSIS — M545 Low back pain: Secondary | ICD-10-CM | POA: Diagnosis not present

## 2020-05-11 ENCOUNTER — Telehealth: Payer: Self-pay | Admitting: Family Medicine

## 2020-05-11 DIAGNOSIS — M545 Low back pain: Secondary | ICD-10-CM | POA: Diagnosis not present

## 2020-05-11 DIAGNOSIS — R262 Difficulty in walking, not elsewhere classified: Secondary | ICD-10-CM | POA: Diagnosis not present

## 2020-05-11 DIAGNOSIS — E039 Hypothyroidism, unspecified: Secondary | ICD-10-CM

## 2020-05-11 DIAGNOSIS — Z0000 Encounter for general adult medical examination without abnormal findings: Secondary | ICD-10-CM

## 2020-05-11 DIAGNOSIS — M6281 Muscle weakness (generalized): Secondary | ICD-10-CM | POA: Diagnosis not present

## 2020-05-11 DIAGNOSIS — R7309 Other abnormal glucose: Secondary | ICD-10-CM

## 2020-05-11 DIAGNOSIS — E7800 Pure hypercholesterolemia, unspecified: Secondary | ICD-10-CM

## 2020-05-11 NOTE — Telephone Encounter (Signed)
-----   Message from Cloyd Stagers, RT sent at 04/28/2020  1:42 PM EDT ----- Regarding: Lab Orders for Thursday 6.3.2021 Please place lab orders for Thursday 6.3.2021, office visit for physical on Thursday 6.10.2021 Thank you, Dyke Maes RT(R)

## 2020-05-12 ENCOUNTER — Other Ambulatory Visit: Payer: Self-pay

## 2020-05-12 ENCOUNTER — Other Ambulatory Visit (INDEPENDENT_AMBULATORY_CARE_PROVIDER_SITE_OTHER): Payer: Medicare HMO

## 2020-05-12 DIAGNOSIS — Z0000 Encounter for general adult medical examination without abnormal findings: Secondary | ICD-10-CM | POA: Diagnosis not present

## 2020-05-12 DIAGNOSIS — E7800 Pure hypercholesterolemia, unspecified: Secondary | ICD-10-CM | POA: Diagnosis not present

## 2020-05-12 DIAGNOSIS — R7309 Other abnormal glucose: Secondary | ICD-10-CM | POA: Diagnosis not present

## 2020-05-12 DIAGNOSIS — E039 Hypothyroidism, unspecified: Secondary | ICD-10-CM

## 2020-05-12 LAB — CBC WITH DIFFERENTIAL/PLATELET
Basophils Absolute: 0.1 10*3/uL (ref 0.0–0.1)
Basophils Relative: 0.7 % (ref 0.0–3.0)
Eosinophils Absolute: 0 10*3/uL (ref 0.0–0.7)
Eosinophils Relative: 0.7 % (ref 0.0–5.0)
HCT: 39.6 % (ref 36.0–46.0)
Hemoglobin: 13.1 g/dL (ref 12.0–15.0)
Lymphocytes Relative: 20.1 % (ref 12.0–46.0)
Lymphs Abs: 1.5 10*3/uL (ref 0.7–4.0)
MCHC: 33.2 g/dL (ref 30.0–36.0)
MCV: 90.9 fl (ref 78.0–100.0)
Monocytes Absolute: 0.4 10*3/uL (ref 0.1–1.0)
Monocytes Relative: 5.2 % (ref 3.0–12.0)
Neutro Abs: 5.6 10*3/uL (ref 1.4–7.7)
Neutrophils Relative %: 73.3 % (ref 43.0–77.0)
Platelets: 212 10*3/uL (ref 150.0–400.0)
RBC: 4.36 Mil/uL (ref 3.87–5.11)
RDW: 12.9 % (ref 11.5–15.5)
WBC: 7.6 10*3/uL (ref 4.0–10.5)

## 2020-05-12 LAB — COMPREHENSIVE METABOLIC PANEL
ALT: 15 U/L (ref 0–35)
AST: 19 U/L (ref 0–37)
Albumin: 4.4 g/dL (ref 3.5–5.2)
Alkaline Phosphatase: 68 U/L (ref 39–117)
BUN: 21 mg/dL (ref 6–23)
CO2: 27 mEq/L (ref 19–32)
Calcium: 9.5 mg/dL (ref 8.4–10.5)
Chloride: 103 mEq/L (ref 96–112)
Creatinine, Ser: 1.28 mg/dL — ABNORMAL HIGH (ref 0.40–1.20)
GFR: 42.23 mL/min — ABNORMAL LOW (ref >60.00)
Glucose, Bld: 100 mg/dL — ABNORMAL HIGH (ref 70–99)
Potassium: 4.4 mEq/L (ref 3.5–5.1)
Sodium: 138 mEq/L (ref 135–145)
Total Bilirubin: 0.5 mg/dL (ref 0.2–1.2)
Total Protein: 7 g/dL (ref 6.0–8.3)

## 2020-05-12 LAB — TSH: TSH: 1.83 u[IU]/mL (ref 0.35–4.50)

## 2020-05-12 LAB — LIPID PANEL
Cholesterol: 182 mg/dL (ref 0–200)
HDL: 66.9 mg/dL (ref >39.00)
LDL Cholesterol: 95 mg/dL (ref 0–99)
NonHDL: 115.33
Total CHOL/HDL Ratio: 3
Triglycerides: 103 mg/dL (ref 0.0–149.0)
VLDL: 20.6 mg/dL (ref 0.0–40.0)

## 2020-05-12 LAB — HEMOGLOBIN A1C: Hgb A1c MFr Bld: 5.7 % (ref 4.6–6.5)

## 2020-05-16 ENCOUNTER — Ambulatory Visit (INDEPENDENT_AMBULATORY_CARE_PROVIDER_SITE_OTHER): Payer: Medicare HMO

## 2020-05-16 DIAGNOSIS — Z0000 Encounter for general adult medical examination without abnormal findings: Secondary | ICD-10-CM | POA: Diagnosis not present

## 2020-05-16 NOTE — Progress Notes (Signed)
Subjective:   TAHJAE DURR is a 64 y.o. female who presents for Medicare Annual (Subsequent) preventive examination.  Review of Systems: N/A   I connected with the patient today by telephone and verified that I am speaking with the correct person using two identifiers. Location patient: home Location nurse: work Persons participating in the virtual visit: patient, Marine scientist.   I discussed the limitations, risks, security and privacy concerns of performing an evaluation and management service by telephone and the availability of in person appointments. I also discussed with the patient that there may be a patient responsible charge related to this service. The patient expressed understanding and verbally consented to this telephonic visit.    Interactive audio and video telecommunications were attempted between this nurse and patient, however failed, due to patient having technical difficulties OR patient did not have access to video capability.  We continued and completed visit with audio only.     Cardiac Risk Factors include: dyslipidemia     Objective:     Vitals: There were no vitals taken for this visit.  There is no height or weight on file to calculate BMI.  Advanced Directives 05/16/2020 05/08/2019 02/24/2018 02/20/2017 03/26/2016 05/20/2015 04/22/2014  Does Patient Have a Medical Advance Directive? Yes No No Yes No No Patient does not have advance directive;Patient would like information  Type of Advance Directive [place];Living will - - [place];Living will - - -  Copy of [place] in Chart? No - copy requested - - No - copy requested - - -  Would patient like information on creating a medical advance directive? - No - Patient declined No - Patient declined - No - patient declined information - Advance directive packet given    Tobacco Social History   Tobacco Use  Smoking Status Never Smoker  Smokeless Tobacco Never  Used     Counseling given: Not Answered   Clinical Intake:  Pre-visit preparation completed: Yes  Pain : 0-10 Pain Score: 0-No pain Pain Type: Chronic pain Pain Location: Back Pain Orientation: Lower Pain Descriptors / Indicators: Aching Pain Onset: More than a month ago Pain Frequency: Intermittent Pain Relieving Factors: physical therapy  Pain Relieving Factors: physical therapy  Nutritional Risks: None Diabetes: No  How often do you need to have someone help you when you read instructions, pamphlets, or other written materials from your doctor or pharmacy?: 1 - Never What is the last grade level you completed in school?: bachelors  Interpreter Needed?: No  Information entered by :: CJOHNSON, LPN  Past Medical History:  Diagnosis Date  . Allergic rhinitis   . Bipolar 1 disorder ([place])   . Fissure, anal   . GERD (gastroesophageal reflux disease)   . History of basal cell carcinoma excision    ON FACE  . History of squamous cell carcinoma excision    ON RIGHT LEG  . Hyperlipidemia   . Hypothyroidism   . IBS (irritable bowel syndrome)   . Liver cyst   . Mallet finger of left hand    small finger   Past Surgical History:  Procedure Laterality Date  . CARDIAC CATHETERIZATION  04/1999   per pt normal  . CARDIOVASCULAR STRESS TEST  02-23-2011  dr Chrissie Noa [facility]ath [facility] clinic)   normal nuclear study/  no ischemia/  ef 64%  . CATARACT EXTRACTION W/ INTRAOCULAR LENS  IMPLANT, BILATERAL  2003   Bil  . CESAREAN SECTION  1984  . CLOSED  REDUCTION FINGER WITH PERCUTANEOUS PINNING Left 04/22/2014   Procedure: LEFT SMALL FINGER CLOSED REDUCTION PINNING;  Surgeon: Linna Hoff, MD;  Location: [facility];  Service: Orthopedics;  Laterality: Left;  . COLONOSCOPY  11/2002  . LAPAROSCOPIC CHOLECYSTECTOMY  03-18-2000  . TRANSTHORACIC ECHOCARDIOGRAM  02-23-2011   normal lvf/  ef 58%/  mild rve/   mild lae/  mild  mr  & tr/  mild pulmonary htn  . WISDOM TOOTH  EXTRACTION     Family History  Problem Relation Age of Onset  . Stroke Mother   . Heart disease Mother   . Hypertension Father   . Diabetes Father   . Hyperlipidemia Father   . Diabetes Brother    Social History   Socioeconomic History  . Marital status: Married    Spouse name: Not on file  . Number of children: 1  . Years of education: Not on file  . Highest education level: Not on file  Occupational History  . Occupation: Home    Employer: RETIRED  Tobacco Use  . Smoking status: Never Smoker  . Smokeless tobacco: Never Used  Substance and Sexual Activity  . Alcohol use: Yes    Alcohol/week: 0.0 standard drinks    Comment: wine- rarely  . Drug use: No  . Sexual activity: Not Currently    Birth control/protection: Post-menopausal  Other Topics Concern  . Not on file  Social History Narrative   Moved here from [place]   Social Determinants of Health   Financial Resource Strain: Low Risk   . Difficulty of Paying Living Expenses: Not hard at all  Food Insecurity: No Food Insecurity  . Worried About Charity fundraiser in the Last Year: Never true  . Ran Out of Food in the Last Year: Never true  Transportation Needs: No Transportation Needs  . Lack of Transportation (Medical): No  . Lack of Transportation (Non-Medical): No  Physical Activity: Sufficiently Active  . Days of Exercise per Week: 4 days  . Minutes of Exercise per Session: 50 min  Stress: Stress Concern Present  . Feeling of Stress : To some extent  Social Connections:   . Frequency of Communication with Friends and Family:   . Frequency of Social Gatherings with Friends and Family:   . Attends Religious Services:   . Active Member of Clubs or Organizations:   . Attends Archivist Meetings:   Marland Kitchen Marital Status:     Outpatient Encounter Medications as of 05/16/2020  Medication Sig  . ARIPiprazole (ABILIFY) 30 MG tablet Take 30 mg by mouth daily.    Marland Kitchen aspirin 81 MG tablet Take 81 mg by mouth  daily.    . Biotin w/ Vitamins C & E (HAIR/SKIN/NAILS PO) Take 1 tablet by mouth daily.  Marland Kitchen buPROPion (WELLBUTRIN XL) 150 MG 24 hr tablet Take three tablets by mouth every morning  . cetirizine (ZYRTEC) 10 MG tablet Take 10 mg by mouth daily as needed for allergies.  . Cholecalciferol (VITAMIN D) 2000 UNITS CAPS Take 1 capsule by mouth daily.    . clonazePAM (KLONOPIN) 1 MG tablet Take 1 mg by mouth at bedtime as needed.    . Coenzyme Q10 (COQ10 PO) Take 100 mg by mouth daily.  . fluticasone (FLONASE) 50 MCG/ACT nasal spray Place 2 sprays into both nostrils daily as needed.  . lansoprazole (PREVACID) 30 MG capsule TAKE 1 CAPSULE EVERY DAY  . levothyroxine (SYNTHROID) 50 MCG tablet TAKE 1 TABLET EVERY DAY  .  lovastatin (MEVACOR) 40 MG tablet TAKE 1 TABLET EVERY DAY  . Multiple Vitamin (MULTIVITAMIN) tablet Take 1 tablet by mouth daily.    Marland Kitchen tiZANidine (ZANAFLEX) 4 MG tablet Take 1 tablet by mouth at bedtime as needed.  . zolpidem (AMBIEN) 10 MG tablet Take 10 mg by mouth at bedtime.    No facility-administered encounter medications on file as of 05/16/2020.    Activities of Daily Living In your present state of health, do you have any difficulty performing the following activities: 05/16/2020  Hearing? Y  Comment wears hearing aids  Vision? N  Difficulty concentrating or making decisions? N  Walking or climbing stairs? N  Dressing or bathing? N  Doing errands, shopping? N  Preparing Food and eating ? N  Using the Toilet? N  In the past six months, have you accidently leaked urine? N  Do you have problems with loss of bowel control? N  Managing your Medications? N  Managing your Finances? N  Housekeeping or managing your Housekeeping? N  Some recent data might be hidden    Patient Care Team: Tower, Wynelle Fanny, MD as PCP - Kenton Vale, OD as Consulting Physician (Optometry)    Assessment:   This is a routine wellness examination for Pauleen.  Exercise Activities and Dietary  recommendations Current Exercise Habits: Structured exercise class, Type of exercise: strength training/weights, Time (Minutes): 45, Frequency (Times/Week): 4, Weekly Exercise (Minutes/Week): 180, Intensity: Moderate, Exercise limited by: None identified  Goals    . Patient Stated     Starting 05/08/19, I will continue to take medications as prescribed.     . Patient Stated     05/16/2020, I will continue to do [employer] 4 days a week for 45 minutes.        Fall Risk Fall Risk  05/16/2020 05/08/2019 02/24/2018 02/20/2017 10/25/2015  Falls in the past year? 0 1 Yes No No  Comment - fell while walking up steps on bus fell after slipping in mud; brusing to elbow and knee - -  Number falls in past yr: 0 0 1 - -  Injury with Fall? 0 1 Yes - -  Comment - - - - -  Risk for fall due to : Medication side effect - - - -  Follow up Falls evaluation completed;Falls prevention discussed - - - -   Is the patient's home free of loose throw rugs in walkways, pet beds, electrical cords, etc?   yes      Grab bars in the bathroom? no      Handrails on the stairs?   yes      Adequate lighting?   yes  Timed Get Up and Go performed: N/A  Depression Screen PHQ 2/9 Scores 05/16/2020 05/08/2019 05/08/2019 02/24/2018  PHQ - 2 Score 6 3 0 2  PHQ- 9 Score 6 7 - 8     Cognitive Function MMSE - Mini Mental State Exam 05/16/2020 05/08/2019 02/24/2018 02/20/2017  Orientation to time 5 5 5 5   Orientation to Place 5 5 5 5   Registration 3 3 3 3   Attention/ Calculation 5 0 0 0  Recall 3 3 3 2   Recall-comments - - - pt was unable to recall 1 of 3 words  Language- name 2 objects - 0 0 0  Language- repeat 1 1 1 1   Language- follow 3 step command - 0 3 3  Language- read & follow direction - 0 0 0  Write a sentence - 0 0  0  Copy design - 0 0 0  Total score - 17 20 19   Mini Cog  Mini-Cog screen was completed. Maximum score is 22. A value of 0 denotes this part of the MMSE was not completed or the patient failed this  part of the Mini-Cog screening.       Immunization History  Administered Date(s) Administered  . Influenza Split 09/16/2012  . Influenza Whole 10/01/2006, 09/14/2008, 09/07/2009, 09/04/2010, 08/21/2011  . Influenza, Seasonal, Injecte, Preservative Fre 09/13/2016  . Influenza,inj,Quad PF,6+ Mos 08/26/2017, 08/25/2018, 08/07/2019  . Influenza-Unspecified 09/08/2013, 08/10/2014, 08/25/2015  . PFIZER SARS-COV-2 Vaccination 02/27/2020, 03/23/2020  . Pneumococcal Polysaccharide-23 08/15/2006, 10/16/2013  . Td 12/10/1998, 09/30/2009  . Tdap 08/29/2017  . Zoster 04/13/2011    Qualifies for Shingles Vaccine: Yes   Screening Tests Health Maintenance  Topic Date Due  . HIV Screening  01/12/2024 (Originally 03/09/1971)  . MAMMOGRAM  07/01/2020  . INFLUENZA VACCINE  07/10/2020  . PAP SMEAR-Modifier  05/13/2022  . COLONOSCOPY  05/19/2025  . TETANUS/TDAP  08/30/2027  . COVID-19 Vaccine  Completed  . Hepatitis C Screening  Completed    Cancer Screenings: Lung: Low Dose CT Chest recommended if Age 64-80 years, 30 pack-year currently smoking OR have quit w/in 15 years. Patient does not qualify. Breast:  Up to date on Mammogram: Yes, completed 07/02/2019   Bone Density/Dexa: at age 55 Colorectal: completed 05/20/2015  Additional Screenings:  Hepatitis C Screening: 12/28/2016     Plan:    Patient will continue to go to [employer] 4 days a week for 45 minutes.    I have personally reviewed and noted the following in the patient's chart:   . Medical and social history . Use of alcohol, tobacco or illicit drugs  . Current medications and supplements . Functional ability and status . Nutritional status . Physical activity . Advanced directives . List of other physicians . Hospitalizations, surgeries, and ER visits in previous 12 months . Vitals . Screenings to include cognitive, depression, and falls . Referrals and appointments  In addition, I have reviewed and discussed with  patient certain preventive protocols, quality metrics, and best practice recommendations. A written personalized care plan for preventive services as well as general preventive health recommendations were provided to patient.     Andrez Grime, LPN  04/12/6502

## 2020-05-16 NOTE — Progress Notes (Signed)
PCP notes:  Health Maintenance: Shingrix- Patient wants to get this   Abnormal Screenings: none   Patient concerns: Wants Shingrix vaccine   Nurse concerns: none   Next PCP appt.: 05/19/2020 @ 10:15 am

## 2020-05-16 NOTE — Patient Instructions (Signed)
[name] , Thank you for taking time to come for your Medicare Wellness Visit. I appreciate your ongoing commitment to your health goals. Please review the following plan we discussed and let me know if I can assist you in the future.   Screening recommendations/referrals: Colonoscopy: Up to date, completed 05/20/2015 Mammogram: Up to date, completed 07/02/2019 Bone Density: at age 64 Recommended yearly ophthalmology/optometry visit for glaucoma screening and checkup Recommended yearly dental visit for hygiene and checkup  Vaccinations: Influenza vaccine: Up to date, completed 08/07/2019 Pneumococcal vaccine: at age 36 Tdap vaccine: Up to date, completed 08/29/2017 Shingles vaccine: discussed    Advanced directives: Please bring a copy of your POA (Power of [place]) and/or Living Will to your next appointment.   Conditions/risks identified: hyperlipidemia  Next appointment: 05/19/2020 @ 10:15 am   Preventive Care 40-64 Years, Female Preventive care refers to lifestyle choices and visits with your health care provider that can promote health and wellness. What does preventive care include?  A yearly physical exam. This is also called an annual well check.  Dental exams once or twice a year.  Routine eye exams. Ask your health care provider how often you should have your eyes checked.  Personal lifestyle choices, including:  Daily care of your teeth and gums.  Regular physical activity.  Eating a healthy diet.  Avoiding tobacco and drug use.  Limiting alcohol use.  Practicing safe sex.  Taking low-dose aspirin daily starting at age 38.  Taking vitamin and mineral supplements as recommended by your health care provider. What happens during an annual well check? The services and screenings done by your health care provider during your annual well check will depend on your age, overall health, lifestyle risk factors, and family history of disease. Counseling  Your  health care provider may ask you questions about your:  Alcohol use.  Tobacco use.  Drug use.  Emotional well-being.  Home and relationship well-being.  Sexual activity.  Eating habits.  Work and work Statistician.  Method of birth control.  Menstrual cycle.  Pregnancy history. Screening  You may have the following tests or measurements:  Height, weight, and BMI.  Blood pressure.  Lipid and cholesterol levels. These may be checked every 5 years, or more frequently if you are over 27 years old.  Skin check.  Lung cancer screening. You may have this screening every year starting at age 8 if you have a 30-pack-year history of smoking and currently smoke or have quit within the past 15 years.  Fecal occult blood test (FOBT) of the stool. You may have this test every year starting at age 4.  Flexible sigmoidoscopy or colonoscopy. You may have a sigmoidoscopy every 5 years or a colonoscopy every 10 years starting at age 97.  Hepatitis C blood test.  Hepatitis B blood test.  Sexually transmitted disease (STD) testing.  Diabetes screening. This is done by checking your blood sugar (glucose) after you have not eaten for a while (fasting). You may have this done every 1-3 years.  Mammogram. This may be done every 1-2 years. Talk to your health care provider about when you should start having regular mammograms. This may depend on whether you have a family history of breast cancer.  BRCA-related cancer screening. This may be done if you have a family history of breast, ovarian, tubal, or peritoneal cancers.  Pelvic exam and Pap test. This may be done every 3 years starting at age 30. Starting at age 39, this 51  be done every 5 years if you have a Pap test in combination with an HPV test.  Bone density scan. This is done to screen for osteoporosis. You may have this scan if you are at high risk for osteoporosis. Discuss your test results, treatment options, and if  necessary, the need for more tests with your health care provider. Vaccines  Your health care provider may recommend certain vaccines, such as:  Influenza vaccine. This is recommended every year.  Tetanus, diphtheria, and acellular pertussis (Tdap, Td) vaccine. You may need a Td booster every 10 years.  Zoster vaccine. You may need this after age 82.  Pneumococcal 13-valent conjugate (PCV13) vaccine. You may need this if you have certain conditions and were not previously vaccinated.  Pneumococcal polysaccharide (PPSV23) vaccine. You may need one or two doses if you smoke cigarettes or if you have certain conditions. Talk to your health care provider about which screenings and vaccines you need and how often you need them. This information is not intended to replace advice given to you by your health care provider. Make sure you discuss any questions you have with your health care provider. Document Released: 12/23/2015 Document Revised: 08/15/2016 Document Reviewed: 09/27/2015 Elsevier Interactive Patient Education  2017 [employer] Prevention in the Home Falls can cause injuries. They can happen to people of all ages. There are many things you can do to make your home safe and to help prevent falls. What can I do on the outside of my home?  Regularly fix the edges of walkways and driveways and fix any cracks.  Remove anything that might make you trip as you walk through a door, such as a raised step or threshold.  Trim any bushes or trees on the path to your home.  Use bright outdoor lighting.  Clear any walking paths of anything that might make someone trip, such as rocks or tools.  Regularly check to see if handrails are loose or broken. Make sure that both sides of any steps have handrails.  Any raised decks and porches should have guardrails on the edges.  Have any leaves, snow, or ice cleared regularly.  Use sand or salt on walking paths during  winter.  Clean up any spills in your garage right away. This includes oil or grease spills. What can I do in the bathroom?  Use night lights.  Install grab bars by the toilet and in the tub and shower. Do not use towel bars as grab bars.  Use non-skid mats or decals in the tub or shower.  If you need to sit down in the shower, use a plastic, non-slip stool.  Keep the floor dry. Clean up any water that spills on the floor as soon as it happens.  Remove soap buildup in the tub or shower regularly.  Attach bath mats securely with double-sided non-slip rug tape.  Do not have throw rugs and other things on the floor that can make you trip. What can I do in the bedroom?  Use night lights.  Make sure that you have a light by your bed that is easy to reach.  Do not use any sheets or blankets that are too big for your bed. They should not hang down onto the floor.  Have a firm chair that has side arms. You can use this for support while you get dressed.  Do not have throw rugs and other things on the floor that can make you trip.  What can I do in the kitchen?  Clean up any spills right away.  Avoid walking on wet floors.  Keep items that you use a lot in easy-to-reach places.  If you need to reach something above you, use a strong step stool that has a grab bar.  Keep electrical cords out of the way.  Do not use floor polish or wax that makes floors slippery. If you must use wax, use non-skid floor wax.  Do not have throw rugs and other things on the floor that can make you trip. What can I do with my stairs?  Do not leave any items on the stairs.  Make sure that there are handrails on both sides of the stairs and use them. Fix handrails that are broken or loose. Make sure that handrails are as long as the stairways.  Check any carpeting to make sure that it is firmly attached to the stairs. Fix any carpet that is loose or worn.  Avoid having throw rugs at the top or  bottom of the stairs. If you do have throw rugs, attach them to the floor with carpet tape.  Make sure that you have a light switch at the top of the stairs and the bottom of the stairs. If you do not have them, ask someone to add them for you. What else can I do to help prevent falls?  Wear shoes that:  Do not have high heels.  Have rubber bottoms.  Are comfortable and fit you well.  Are closed at the toe. Do not wear sandals.  If you use a stepladder:  Make sure that it is fully opened. Do not climb a closed stepladder.  Make sure that both sides of the stepladder are locked into place.  Ask someone to hold it for you, if possible.  Clearly mark and make sure that you can see:  Any grab bars or handrails.  First and last steps.  Where the edge of each step is.  Use tools that help you move around (mobility aids) if they are needed. These include:  Canes.  Walkers.  Scooters.  Crutches.  Turn on the lights when you go into a dark area. Replace any light bulbs as soon as they burn out.  Set up your furniture so you have a clear path. Avoid moving your furniture around.  If any of your floors are uneven, fix them.  If there are any pets around you, be aware of where they are.  Review your medicines with your doctor. Some medicines can make you feel dizzy. This can increase your chance of falling. Ask your doctor what other things that you can do to help prevent falls. This information is not intended to replace advice given to you by your health care provider. Make sure you discuss any questions you have with your health care provider. Document Released: 09/22/2009 Document Revised: 05/03/2016 Document Reviewed: 12/31/2014 Elsevier Interactive Patient Education  2017 [employer].

## 2020-05-17 DIAGNOSIS — R262 Difficulty in walking, not elsewhere classified: Secondary | ICD-10-CM | POA: Diagnosis not present

## 2020-05-17 DIAGNOSIS — M545 Low back pain: Secondary | ICD-10-CM | POA: Diagnosis not present

## 2020-05-17 DIAGNOSIS — M6281 Muscle weakness (generalized): Secondary | ICD-10-CM | POA: Diagnosis not present

## 2020-05-19 ENCOUNTER — Other Ambulatory Visit: Payer: Self-pay

## 2020-05-19 ENCOUNTER — Encounter: Payer: Self-pay | Admitting: Family Medicine

## 2020-05-19 ENCOUNTER — Ambulatory Visit (INDEPENDENT_AMBULATORY_CARE_PROVIDER_SITE_OTHER): Payer: Medicare HMO | Admitting: Family Medicine

## 2020-05-19 VITALS — BP 132/76 | HR 66 | Temp 97.0°F | Ht 60.5 in | Wt 215.1 lb

## 2020-05-19 DIAGNOSIS — E039 Hypothyroidism, unspecified: Secondary | ICD-10-CM | POA: Diagnosis not present

## 2020-05-19 DIAGNOSIS — R7309 Other abnormal glucose: Secondary | ICD-10-CM | POA: Diagnosis not present

## 2020-05-19 DIAGNOSIS — F3177 Bipolar disorder, in partial remission, most recent episode mixed: Secondary | ICD-10-CM

## 2020-05-19 DIAGNOSIS — K219 Gastro-esophageal reflux disease without esophagitis: Secondary | ICD-10-CM | POA: Diagnosis not present

## 2020-05-19 DIAGNOSIS — G8929 Other chronic pain: Secondary | ICD-10-CM

## 2020-05-19 DIAGNOSIS — R7989 Other specified abnormal findings of blood chemistry: Secondary | ICD-10-CM

## 2020-05-19 DIAGNOSIS — Z0000 Encounter for general adult medical examination without abnormal findings: Secondary | ICD-10-CM | POA: Diagnosis not present

## 2020-05-19 DIAGNOSIS — E7800 Pure hypercholesterolemia, unspecified: Secondary | ICD-10-CM | POA: Diagnosis not present

## 2020-05-19 DIAGNOSIS — M25562 Pain in left knee: Secondary | ICD-10-CM

## 2020-05-19 MED ORDER — LANSOPRAZOLE 30 MG PO CPDR: 30.0000 mg | DELAYED_RELEASE_CAPSULE | Freq: Every day | ORAL | 3 refills | Status: DC

## 2020-05-19 MED ORDER — LOVASTATIN 40 MG PO TABS: 40.0000 mg | ORAL_TABLET | Freq: Every day | ORAL | 3 refills | Status: DC

## 2020-05-19 MED ORDER — LEVOTHYROXINE SODIUM 50 MCG PO TABS: 50.0000 ug | ORAL_TABLET | Freq: Every day | ORAL | 3 refills | Status: DC

## 2020-05-19 NOTE — Assessment & Plan Note (Signed)
?   Re injury  Recommend elevation/ice and only low impact exercise for now  Avoid oral nsaids due to recent inc in Cr (will try otc voltaren gel)  Update if not imp in 2 wk Has done PT before

## 2020-05-19 NOTE — Assessment & Plan Note (Signed)
Reviewed health habits including diet and exercise and skin cancer prevention Reviewed appropriate screening tests for age  Also reviewed health mt list, fam hx and immunization status , as well as social and family history   See HPI amw reviewed Labs reviewed  Recent knee injury- ice and relative rest  Disc shingrix vaccine-she will check on coverage covid vaccinated Mammogram due in July Working on weight loss and better diet  Working with psychiatry re: mental health

## 2020-05-19 NOTE — Assessment & Plan Note (Addendum)
Cr 1.28 GFR 42.2   Per pt -not enough water intake Will aim for 64 oz per day  Also avoid nsaids Will continue to follow

## 2020-05-19 NOTE — Assessment & Plan Note (Signed)
Discussed how this problem influences overall health and the risks it imposes  Reviewed plan for weight loss with lower calorie diet (via better food choices and also portion control or program like weight watchers) and exercise building up to or more than 30 minutes 5 days per week including some aerobic activity   Commended work so far  Low impact exercise (bike and water) encouraged

## 2020-05-19 NOTE — Assessment & Plan Note (Signed)
Continues generic prevacid  This helps as long as she takes it  Enc wt loss

## 2020-05-19 NOTE — Progress Notes (Signed)
Subjective:    Patient ID: [name] [name], female    DOB: Nov 24, 1956, 64 y.o.   MRN: [id number]  This visit occurred during the SARS-CoV-2 public health emergency.  Safety protocols were in place, including screening questions prior to the visit, additional usage of staff PPE, and extensive cleaning of exam room while observing appropriate contact time as indicated for disinfecting solutions.    HPI Here for health maintenance exam and to review chronic medical problems    Wt Readings from Last 3 Encounters:  05/19/20 215 lb 2 oz (97.6 kg)  02/25/20 219 lb 1 oz (99.4 kg)  01/25/20 222 lb 12.8 oz (101.1 kg)  silver sneakers 4 times per week  Some walking  Loosing some weight - doing better with diet (less biscuits and fried chicken)  41.32 kg/m  Re injured her L leg (knee)  Swollen and painful  Ran at silver sneakers, then did squats and then slept in a funny position   Still doing PT for her back   Had amw on 6/7 No care gaps Noted interest in the shingrix vaccine   Pap 6/20-normal with neg HPV screen  Mammogram 7/20  Due in July  No lumps   Colonoscopy 6/16 with 10 y recall   covid status-immunized   BP Readings from Last 3 Encounters:  05/19/20 132/76  02/25/20 120/74  01/25/20 118/76   Pulse Readings from Last 3 Encounters:  05/19/20 66  02/25/20 62  01/25/20 67   Cr is elevated Lab Results  Component Value Date   NA 138 05/12/2020   K 4.4 05/12/2020   CO2 27 05/12/2020   GLUCOSE 100 (H) 05/12/2020   BUN 21 05/12/2020   CREATININE 1.28 (H) 05/12/2020   CALCIUM 9.5 05/12/2020   GFRNONAA 58 (L) 10/19/2015   GFRAA 67 10/19/2015  not drinking enough water  Took one dose of aleve   GERD-takes prevacid , still working well   Hypothyroidism  Pt has no clinical changes No change in energy level/ hair or skin/ edema and no tremor Lab Results  Component Value Date   TSH 1.83 05/12/2020     Hyperlipidemia Lab Results  Component Value Date   CHOL  182 05/12/2020   CHOL 184 05/08/2019   CHOL 178 02/24/2018   Lab Results  Component Value Date   HDL 66.90 05/12/2020   HDL 66.20 05/08/2019   HDL 72.30 02/24/2018   Lab Results  Component Value Date   LDLCALC 95 05/12/2020   LDLCALC 88 05/08/2019   LDLCALC 85 02/24/2018   Lab Results  Component Value Date   TRIG 103.0 05/12/2020   TRIG 148.0 05/08/2019   TRIG 108.0 02/24/2018   Lab Results  Component Value Date   CHOLHDL 3 05/12/2020   CHOLHDL 3 05/08/2019   CHOLHDL 2 02/24/2018   No results found for: LDLDIRECT Taking lovastatin  Diet- is doing better with that    H/o elevated glucose level  Lab Results  Component Value Date   HGBA1C 5.7 05/12/2020   Stable  Does not eat a lot of sweets    Bipolar disease- has been in a depressive phase lately /working on that  Is alone too much/not a lot of friends  Sees psychiatry - Dr Lajoyce Corners  Changed her abilify due to balance issues (lowered dose)   Patient Active Problem List   Diagnosis Date Noted  . Bipolar disorder, in partial remission, most recent episode mixed ([place]) 02/25/2020  . Epigastric pain 09/24/2019  .  Chronic diarrhea 09/24/2019  . Contusion of left lower leg 01/05/2019  . Breast lump on left side at 5 o'clock position 08/25/2018  . Elevated glucose level 03/03/2018  . Trochanteric bursitis 01/07/2018  . Low back pain 12/23/2017  . Thoracic back pain 12/23/2017  . Atrophic vaginitis 07/08/2017  . TMJ (temporomandibular joint disorder) 07/08/2017  . Cervical stenosis (uterine cervix) 01/03/2017  . Left anterior knee pain 10/08/2016  . Left knee pain 07/24/2016  . Hearing loss 10/25/2015  . Routine general medical examination at a health care facility 10/17/2015  . Encounter for routine gynecological examination 10/22/2014  . Elevated serum creatinine 10/22/2014  . Colon cancer screening 10/16/2013  . Encounter for Medicare annual wellness exam 10/04/2011  . METATARSALGIA 12/01/2009  .  POSTMENOPAUSAL STATUS 09/30/2009  . ARTHRITIS, RIGHT HIP 03/10/2009  . DEGENERATIVE DISC DISEASE, LUMBAR SPINE 03/10/2009  . ONYCHOMYCOSIS 09/17/2008  . Hypothyroidism 08/05/2007  . Morbid obesity ([place]) 08/05/2007  . Bipolar disorder ([place]) 08/05/2007  . ALLERGIC RHINITIS 08/05/2007  . GERD 08/05/2007  . IBS 08/05/2007  . HYPERCHOLESTEROLEMIA, PURE 07/07/2007   Past Medical History:  Diagnosis Date  . Allergic rhinitis   . Bipolar 1 disorder ([place])   . Fissure, anal   . GERD (gastroesophageal reflux disease)   . History of basal cell carcinoma excision    ON FACE  . History of squamous cell carcinoma excision    ON RIGHT LEG  . Hyperlipidemia   . Hypothyroidism   . IBS (irritable bowel syndrome)   . Liver cyst   . Mallet finger of left hand    small finger   Past Surgical History:  Procedure Laterality Date  . CARDIAC CATHETERIZATION  04/1999   per pt normal  . CARDIOVASCULAR STRESS TEST  02-23-2011  dr Chrissie Noa [facility])   normal nuclear study/  no ischemia/  ef 64%  . CATARACT EXTRACTION W/ INTRAOCULAR LENS  IMPLANT, BILATERAL  2003   Bil  . CESAREAN SECTION  1984  . CLOSED REDUCTION FINGER WITH PERCUTANEOUS PINNING Left 04/22/2014   Procedure: LEFT SMALL FINGER CLOSED REDUCTION PINNING;  Surgeon: Linna Hoff, MD;  Location: [facility];  Service: Orthopedics;  Laterality: Left;  . COLONOSCOPY  11/2002  . LAPAROSCOPIC CHOLECYSTECTOMY  03-18-2000  . TRANSTHORACIC ECHOCARDIOGRAM  02-23-2011   normal lvf/  ef 58%/  mild rve/   mild lae/  mild  mr  & tr/  mild pulmonary htn  . WISDOM TOOTH EXTRACTION     Social History   Tobacco Use  . Smoking status: Never Smoker  . Smokeless tobacco: Never Used  Vaping Use  . Vaping Use: Never used  Substance Use Topics  . Alcohol use: Yes    Alcohol/week: 0.0 standard drinks    Comment: wine- rarely  . Drug use: No   Family History  Problem Relation Age of Onset  . Stroke Mother   . Heart  disease Mother   . Hypertension Father   . Diabetes Father   . Hyperlipidemia Father   . Diabetes Brother    Allergies  Allergen Reactions  . Xylocaine [Lidocaine Hcl] Swelling    ALLERGIC TO ALL CAINES EXCEPT SENSORCAINE , AND MARCAINE  . Codeine Nausea And Vomiting    REACTION: vomits blood  . Lamictal [Lamotrigine] Other (See Comments)    REACTION: sores in mouth, muscle aches and also made her manic bipolar  . Lithium Swelling  . Tegretol [Carbamazepine] Other (See Comments)    REACTION: skin  crawls   Current Outpatient Medications on File Prior to Visit  Medication Sig Dispense Refill  . ARIPiprazole (ABILIFY) 15 MG tablet Take 1 tablet by mouth every other day. (30 mg on the days she doesn't take 15 mg)    . ARIPiprazole (ABILIFY) 30 MG tablet Take 30 mg by mouth every other day. (15 mg on the days she doesn't take 30mg )    . aspirin 81 MG tablet Take 81 mg by mouth daily.      Marland Kitchen BIOTIN PO Take 1 tablet by mouth daily.    Marland Kitchen buPROPion (WELLBUTRIN XL) 150 MG 24 hr tablet Take three tablets by mouth every morning    . cetirizine (ZYRTEC) 10 MG tablet Take 10 mg by mouth daily as needed for allergies.    . Cholecalciferol (VITAMIN D) 2000 UNITS CAPS Take 1 capsule by mouth daily.      . clonazePAM (KLONOPIN) 1 MG tablet Take 1 mg by mouth at bedtime as needed.      . Coenzyme Q10 (COQ10 PO) Take 100 mg by mouth daily.    . fluticasone (FLONASE) 50 MCG/ACT nasal spray Place 2 sprays into both nostrils daily as needed. 48 g 3  . Multiple Vitamin (MULTIVITAMIN) tablet Take 1 tablet by mouth daily.      Marland Kitchen tiZANidine (ZANAFLEX) 4 MG tablet Take 1 tablet by mouth at bedtime as needed.  0  . zolpidem (AMBIEN) 10 MG tablet Take 10 mg by mouth at bedtime.      No current facility-administered medications on file prior to visit.    Review of Systems  Constitutional: Negative for activity change, appetite change, fatigue, fever and unexpected weight change.  HENT: Negative for  congestion, ear pain, rhinorrhea, sinus pressure and sore throat.   Eyes: Negative for pain, redness and visual disturbance.  Respiratory: Negative for cough, shortness of breath and wheezing.   Cardiovascular: Negative for chest pain and palpitations.  Gastrointestinal: Negative for abdominal pain, blood in stool, constipation and diarrhea.  Endocrine: Negative for polydipsia and polyuria.  Genitourinary: Negative for dysuria, frequency and urgency.  Musculoskeletal: Positive for arthralgias, back pain and joint swelling. Negative for myalgias.       L knee pain after injury  Skin: Negative for pallor and rash.  Allergic/Immunologic: Negative for environmental allergies.  Neurological: Negative for dizziness, syncope and headaches.  Hematological: Negative for adenopathy. Does not bruise/bleed easily.  Psychiatric/Behavioral: Negative for decreased concentration and dysphoric mood. The patient is not nervous/anxious.        Objective:   Physical Exam Constitutional:      General: She is not in acute distress.    Appearance: Normal appearance. She is well-developed. She is obese. She is not ill-appearing or diaphoretic.  HENT:     Head: Normocephalic and atraumatic.     Right Ear: Tympanic membrane, ear canal and external ear normal.     Left Ear: Tympanic membrane, ear canal and external ear normal.     Nose: Nose normal. No congestion.     Mouth/Throat:     Mouth: Mucous membranes are moist.     Pharynx: Oropharynx is clear. No posterior oropharyngeal erythema.  Eyes:     General: No scleral icterus.    Extraocular Movements: Extraocular movements intact.     Conjunctiva/sclera: Conjunctivae normal.     Pupils: Pupils are equal, round, and reactive to light.  Neck:     Thyroid: No thyromegaly.     Vascular: No carotid bruit or JVD.  Cardiovascular:     Rate and Rhythm: Normal rate and regular rhythm.     Pulses: Normal pulses.     Heart sounds: Normal heart sounds. No  gallop.   Pulmonary:     Effort: Pulmonary effort is normal. No respiratory distress.     Breath sounds: Normal breath sounds. No wheezing.     Comments: Good air exch Chest:     Chest wall: No tenderness.  Abdominal:     General: Bowel sounds are normal. There is no distension or abdominal bruit.     Palpations: Abdomen is soft. There is no mass.     Tenderness: There is no abdominal tenderness.     Hernia: No hernia is present.  Genitourinary:    Comments: Breast exam: No mass, nodules, thickening, tenderness, bulging, retraction, inflamation, nipple discharge or skin changes noted.  No axillary or clavicular LA.     Musculoskeletal:        General: No tenderness. Normal range of motion.     Cervical back: Normal range of motion and neck supple. No rigidity. No muscular tenderness.     Right lower leg: No edema.     Left lower leg: No edema.     Comments: Focal L knee swelling with limited rom   No kyphosis   Lymphadenopathy:     Cervical: No cervical adenopathy.  Skin:    General: Skin is warm and dry.     Coloration: Skin is not pale.     Findings: No erythema or rash.     Comments: Solar lentigines diffusely Some angiomas  Neurological:     Mental Status: She is alert. Mental status is at baseline.     Cranial Nerves: No cranial nerve deficit.     Motor: No abnormal muscle tone.     Coordination: Coordination normal.     Gait: Gait normal.     Deep Tendon Reflexes: Reflexes are normal and symmetric. Reflexes normal.     Comments: No tremor today  Psychiatric:        Mood and Affect: Mood normal.        Cognition and Memory: Cognition and memory normal.           Assessment & Plan:   Problem List Items Addressed This Visit      Digestive   GERD    Continues generic prevacid  This helps as long as she takes it  Enc wt loss      Relevant Medications   lansoprazole (PREVACID) 30 MG capsule     Endocrine   Hypothyroidism    Hypothyroidism  Pt has no  clinical changes No change in energy level/ hair or skin/ edema and no tremor Lab Results  Component Value Date   TSH 1.83 05/12/2020          Relevant Medications   levothyroxine (SYNTHROID) 50 MCG tablet     Other   HYPERCHOLESTEROLEMIA, PURE    Trig down  LDL 95  Disc goals for lipids and reasons to control them Rev last labs with pt Rev low sat fat diet in detail Continue lovastatin and better diet       Relevant Medications   lovastatin (MEVACOR) 40 MG tablet   Morbid obesity ([facility])    Discussed how this problem influences overall health and the risks it imposes  Reviewed plan for weight loss with lower calorie diet (via better food choices and also portion control or program like weight watchers) and exercise building  up to or more than 30 minutes 5 days per week including some aerobic activity   Commended work so far  Low impact exercise (bike and water) encouraged      Elevated serum creatinine    Cr 1.28 GFR 42.2   Per pt -not enough water intake Will aim for 64 oz per day  Also avoid nsaids Will continue to follow      Routine general medical examination at a health care facility - Primary    Reviewed health habits including diet and exercise and skin cancer prevention Reviewed appropriate screening tests for age  Also reviewed health mt list, fam hx and immunization status , as well as social and family history   See HPI amw reviewed Labs reviewed  Recent knee injury- ice and relative rest  Disc shingrix vaccine-she will check on coverage covid vaccinated Mammogram due in July Working on weight loss and better diet  Working with psychiatry re: mental health        Left knee pain    ? Re injury  Recommend elevation/ice and only low impact exercise for now  Avoid oral nsaids due to recent inc in Cr (will try otc voltaren gel)  Update if not imp in 2 wk Has done PT before      Elevated glucose level    A1C is controlled with diet and  exercise disc imp of low glycemic diet and wt loss to prevent DM2       Bipolar disorder, in partial remission, most recent episode mixed ([facility])    In a depressed phase right now  Had to dec abilify due to balance side eff Continues to see Dr Toy Care Reviewed stressors/ coping techniques/symptoms/ support sources/ tx options and side effects in detail today Continues self care  Suggested hobbies

## 2020-05-19 NOTE — Assessment & Plan Note (Signed)
Hypothyroidism  Pt has no clinical changes No change in energy level/ hair or skin/ edema and no tremor Lab Results  Component Value Date   TSH 1.83 05/12/2020

## 2020-05-19 NOTE — Assessment & Plan Note (Signed)
A1C is controlled with diet and exercise disc imp of low glycemic diet and wt loss to prevent DM2

## 2020-05-19 NOTE — Assessment & Plan Note (Signed)
In a depressed phase right now  Had to dec abilify due to balance side eff Continues to see Dr Toy Care Reviewed stressors/ coping techniques/symptoms/ support sources/ tx options and side effects in detail today Continues self care  Suggested hobbies

## 2020-05-19 NOTE — Patient Instructions (Addendum)
Ice your left knee/ and the areas that hurt  An exercise bike is ok if it does not hurt  Water exercise also  Bio freeze helps  Also voltaren gel over the counter (is an anti inflammatory gel)  Over the counter    If you are interested in the new shingles vaccine (Shingrix) - call your local pharmacy to check on coverage and availability  If affordable, get on a wait list at your pharmacy to get the vaccine.  For kidney health  Drink lots of fluids (64 oz per day, mostly water)

## 2020-05-19 NOTE — Assessment & Plan Note (Signed)
Trig down  LDL 95  Disc goals for lipids and reasons to control them Rev last labs with pt Rev low sat fat diet in detail Continue lovastatin and better diet

## 2020-05-24 DIAGNOSIS — R262 Difficulty in walking, not elsewhere classified: Secondary | ICD-10-CM | POA: Diagnosis not present

## 2020-05-24 DIAGNOSIS — M545 Low back pain: Secondary | ICD-10-CM | POA: Diagnosis not present

## 2020-05-24 DIAGNOSIS — M6281 Muscle weakness (generalized): Secondary | ICD-10-CM | POA: Diagnosis not present

## 2020-05-26 DIAGNOSIS — R262 Difficulty in walking, not elsewhere classified: Secondary | ICD-10-CM | POA: Diagnosis not present

## 2020-05-26 DIAGNOSIS — M545 Low back pain: Secondary | ICD-10-CM | POA: Diagnosis not present

## 2020-05-26 DIAGNOSIS — M6281 Muscle weakness (generalized): Secondary | ICD-10-CM | POA: Diagnosis not present

## 2020-05-30 DIAGNOSIS — M6281 Muscle weakness (generalized): Secondary | ICD-10-CM | POA: Diagnosis not present

## 2020-05-30 DIAGNOSIS — M545 Low back pain: Secondary | ICD-10-CM | POA: Diagnosis not present

## 2020-05-30 DIAGNOSIS — R262 Difficulty in walking, not elsewhere classified: Secondary | ICD-10-CM | POA: Diagnosis not present

## 2020-05-31 ENCOUNTER — Ambulatory Visit (INDEPENDENT_AMBULATORY_CARE_PROVIDER_SITE_OTHER): Payer: Medicare HMO | Admitting: Psychology

## 2020-05-31 DIAGNOSIS — F4310 Post-traumatic stress disorder, unspecified: Secondary | ICD-10-CM | POA: Diagnosis not present

## 2020-06-01 DIAGNOSIS — R262 Difficulty in walking, not elsewhere classified: Secondary | ICD-10-CM | POA: Diagnosis not present

## 2020-06-01 DIAGNOSIS — M6281 Muscle weakness (generalized): Secondary | ICD-10-CM | POA: Diagnosis not present

## 2020-06-01 DIAGNOSIS — M545 Low back pain: Secondary | ICD-10-CM | POA: Diagnosis not present

## 2020-06-28 ENCOUNTER — Ambulatory Visit (INDEPENDENT_AMBULATORY_CARE_PROVIDER_SITE_OTHER): Payer: Medicare HMO | Admitting: Psychology

## 2020-06-28 DIAGNOSIS — F3132 Bipolar disorder, current episode depressed, moderate: Secondary | ICD-10-CM | POA: Diagnosis not present

## 2020-07-26 ENCOUNTER — Ambulatory Visit (INDEPENDENT_AMBULATORY_CARE_PROVIDER_SITE_OTHER): Payer: Medicare HMO | Admitting: Psychology

## 2020-07-26 DIAGNOSIS — F3132 Bipolar disorder, current episode depressed, moderate: Secondary | ICD-10-CM

## 2020-08-02 ENCOUNTER — Encounter: Payer: Self-pay | Admitting: Family Medicine

## 2020-08-02 ENCOUNTER — Ambulatory Visit (INDEPENDENT_AMBULATORY_CARE_PROVIDER_SITE_OTHER)
Admission: RE | Admit: 2020-08-02 | Discharge: 2020-08-02 | Disposition: A | Discharge status: Home / Self Care | Payer: Medicare HMO | Source: Ambulatory Visit | Attending: Family Medicine | Admitting: Family Medicine

## 2020-08-02 ENCOUNTER — Other Ambulatory Visit: Payer: Self-pay

## 2020-08-02 ENCOUNTER — Ambulatory Visit (INDEPENDENT_AMBULATORY_CARE_PROVIDER_SITE_OTHER): Payer: Medicare HMO | Admitting: Family Medicine

## 2020-08-02 VITALS — BP 126/68 | HR 75 | Temp 97.5°F | Ht 60.5 in | Wt 217.1 lb

## 2020-08-02 DIAGNOSIS — G8929 Other chronic pain: Secondary | ICD-10-CM

## 2020-08-02 DIAGNOSIS — B070 Plantar wart: Secondary | ICD-10-CM | POA: Insufficient documentation

## 2020-08-02 DIAGNOSIS — M25561 Pain in right knee: Secondary | ICD-10-CM | POA: Diagnosis not present

## 2020-08-02 NOTE — Assessment & Plan Note (Signed)
After consent obtained- area cleaned and debrided with #11 scalpel  Some relief with this  Adv use of compound W or wart plaster or other similar product otc as directed  Then debride gently with pumice stone Handout given  Will update/f/u if no improvement and will consider cryo tx

## 2020-08-02 NOTE — Assessment & Plan Note (Signed)
Lateral pain w/o swelling  Worse with recent squats and high impact exercise  Disc minimizing trauma to knee Elevation/ice  Xray today- suspect OA Tylenol and voltaren gel recommended Given handout re: knee rehab (may also benefit from PT)  If no imp may be candidate for injection Wt loss enc

## 2020-08-02 NOTE — Progress Notes (Signed)
Subjective:    Patient ID: [name] [name], female    DOB: [birth year], 64 y.o.   MRN: [id number]  This visit occurred during the SARS-CoV-2 public health emergency.  Safety protocols were in place, including screening questions prior to the visit, additional usage of staff PPE, and extensive cleaning of exam room while observing appropriate contact time as indicated for disinfecting solutions.    HPI Pt presents with bump on L foot and also R knee pain   Wt Readings from Last 3 Encounters:  08/02/20 217 lb 1 oz (98.5 kg)  05/19/20 215 lb 2 oz (97.6 kg)  02/25/20 219 lb 1 oz (99.4 kg)   41.69 kg/m  Doing a lot of squats in silver sneakers program (she avoids lunges) R knee has started to bother her more (years ago had trouble)  Is aggravating   Not swollen or bruised  No warmth  Hurts laterally   Worst to go up stairs and to squat  Does not hurt in bed at night  A little sore to sit   Takes tylenol  Has not tried voltaren gel (it helped in the past to L knee) -still has some    Of note-pt has tricompartmental arthritis in L knee   Also hard bump in middle of arch/plantar food  Hurts to step on  Feels like a bead  Started yesterday  No trauma   Patient Active Problem List   Diagnosis Date Noted  . Right knee pain 08/02/2020  . Plantar wart, left foot 08/02/2020  . Bipolar disorder, in partial remission, most recent episode mixed ([place]) 02/25/2020  . Epigastric pain 09/24/2019  . Chronic diarrhea 09/24/2019  . Contusion of left lower leg 01/05/2019  . Breast lump on left side at 5 o'clock position 08/25/2018  . Elevated glucose level 03/03/2018  . Trochanteric bursitis 01/07/2018  . Low back pain 12/23/2017  . Thoracic back pain 12/23/2017  . Atrophic vaginitis 07/08/2017  . TMJ (temporomandibular joint disorder) 07/08/2017  . Cervical stenosis (uterine cervix) 01/03/2017  . Left anterior knee pain 10/08/2016  . Left knee pain 07/24/2016  . Hearing loss  10/25/2015  . Routine general medical examination at a health care facility 10/17/2015  . Encounter for routine gynecological examination 10/22/2014  . Elevated serum creatinine 10/22/2014  . Colon cancer screening 10/16/2013  . Encounter for Medicare annual wellness exam 10/04/2011  . METATARSALGIA 12/01/2009  . POSTMENOPAUSAL STATUS 09/30/2009  . ARTHRITIS, RIGHT HIP 03/10/2009  . DEGENERATIVE DISC DISEASE, LUMBAR SPINE 03/10/2009  . ONYCHOMYCOSIS 09/17/2008  . Hypothyroidism 08/05/2007  . Morbid obesity ([place]) 08/05/2007  . Bipolar disorder ([place]) 08/05/2007  . ALLERGIC RHINITIS 08/05/2007  . GERD 08/05/2007  . IBS 08/05/2007  . HYPERCHOLESTEROLEMIA, PURE 07/07/2007   Past Medical History:  Diagnosis Date  . Allergic rhinitis   . Bipolar 1 disorder ([place])   . Fissure, anal   . GERD (gastroesophageal reflux disease)   . History of basal cell carcinoma excision    ON FACE  . History of squamous cell carcinoma excision    ON RIGHT LEG  . Hyperlipidemia   . Hypothyroidism   . IBS (irritable bowel syndrome)   . Liver cyst   . Mallet finger of left hand    small finger   Past Surgical History:  Procedure Laterality Date  . CARDIAC CATHETERIZATION  04/1999   per pt normal  . CARDIOVASCULAR STRESS TEST  02-23-2011  dr Chrissie Noa [facility] clinic)   normal nuclear study/  no ischemia/  ef 64%  . CATARACT EXTRACTION W/ INTRAOCULAR LENS  IMPLANT, BILATERAL  2003   Bil  . CESAREAN SECTION  1984  . CLOSED REDUCTION FINGER WITH PERCUTANEOUS PINNING Left 04/22/2014   Procedure: LEFT SMALL FINGER CLOSED REDUCTION PINNING;  Surgeon: Linna Hoff, MD;  Location: [place];  Service: Orthopedics;  Laterality: Left;  . COLONOSCOPY  11/2002  . LAPAROSCOPIC CHOLECYSTECTOMY  03-18-2000  . TRANSTHORACIC ECHOCARDIOGRAM  02-23-2011   normal lvf/  ef 58%/  mild rve/   mild lae/  mild  mr  & tr/  mild pulmonary htn  . WISDOM TOOTH EXTRACTION     Social History   Tobacco  Use  . Smoking status: Never Smoker  . Smokeless tobacco: Never Used  Vaping Use  . Vaping Use: Never used  Substance Use Topics  . Alcohol use: Yes    Alcohol/week: 0.0 standard drinks    Comment: wine- rarely  . Drug use: No   Family History  Problem Relation Age of Onset  . Stroke Mother   . Heart disease Mother   . Hypertension Father   . Diabetes Father   . Hyperlipidemia Father   . Diabetes Brother    Allergies  Allergen Reactions  . Xylocaine [Lidocaine Hcl] Swelling    ALLERGIC TO ALL CAINES EXCEPT SENSORCAINE , AND MARCAINE  . Codeine Nausea And Vomiting    REACTION: vomits blood  . Lamictal [Lamotrigine] Other (See Comments)    REACTION: sores in mouth, muscle aches and also made her manic bipolar  . Lithium Swelling  . Tegretol [Carbamazepine] Other (See Comments)    REACTION: skin crawls   Current Outpatient Medications on File Prior to Visit  Medication Sig Dispense Refill  . ARIPiprazole (ABILIFY) 15 MG tablet Take 1 tablet by mouth every other day. (30 mg on the days she doesn't take 15 mg)    . ARIPiprazole (ABILIFY) 30 MG tablet Take 30 mg by mouth every other day. (15 mg on the days she doesn't take 30mg )    . aspirin 81 MG tablet Take 81 mg by mouth daily.      Marland Kitchen BIOTIN PO Take 1 tablet by mouth daily.    Marland Kitchen buPROPion (WELLBUTRIN XL) 150 MG 24 hr tablet Take three tablets by mouth every morning    . cetirizine (ZYRTEC) 10 MG tablet Take 10 mg by mouth daily as needed for allergies.    . Cholecalciferol (VITAMIN D) 2000 UNITS CAPS Take 1 capsule by mouth daily.      . clonazePAM (KLONOPIN) 1 MG tablet Take 1 mg by mouth at bedtime as needed.      . Coenzyme Q10 (COQ10 PO) Take 100 mg by mouth daily.    . fluticasone (FLONASE) 50 MCG/ACT nasal spray Place 2 sprays into both nostrils daily as needed. 48 g 3  . lansoprazole (PREVACID) 30 MG capsule Take 1 capsule (30 mg total) by mouth daily. 90 capsule 3  . levothyroxine (SYNTHROID) 50 MCG tablet Take 1  tablet (50 mcg total) by mouth daily. 90 tablet 3  . lovastatin (MEVACOR) 40 MG tablet Take 1 tablet (40 mg total) by mouth daily. 90 tablet 3  . Multiple Vitamin (MULTIVITAMIN) tablet Take 1 tablet by mouth daily.      Marland Kitchen tiZANidine (ZANAFLEX) 4 MG tablet Take 1 tablet by mouth at bedtime as needed.  0  . zolpidem (AMBIEN) 10 MG tablet Take 10 mg by mouth at bedtime.  No current facility-administered medications on file prior to visit.    Review of Systems  Constitutional: Negative for activity change, appetite change, fatigue, fever and unexpected weight change.  HENT: Negative for congestion, ear pain, rhinorrhea, sinus pressure and sore throat.   Eyes: Negative for pain, redness and visual disturbance.  Respiratory: Negative for cough, shortness of breath and wheezing.   Cardiovascular: Negative for chest pain and palpitations.  Gastrointestinal: Negative for abdominal pain, blood in stool, constipation and diarrhea.  Endocrine: Negative for polydipsia and polyuria.  Genitourinary: Negative for dysuria, frequency and urgency.  Musculoskeletal: Positive for arthralgias and gait problem. Negative for back pain, joint swelling and myalgias.  Skin: Negative for pallor and rash.       Plantar wart L foot   Allergic/Immunologic: Negative for environmental allergies.  Neurological: Negative for dizziness, syncope and headaches.  Hematological: Negative for adenopathy. Does not bruise/bleed easily.  Psychiatric/Behavioral: Negative for decreased concentration and dysphoric mood. The patient is not nervous/anxious.        Objective:   Physical Exam Constitutional:      General: She is not in acute distress.    Appearance: Normal appearance. She is obese. She is not ill-appearing.  Eyes:     General: No scleral icterus.    Conjunctiva/sclera: Conjunctivae normal.     Pupils: Pupils are equal, round, and reactive to light.  Cardiovascular:     Rate and Rhythm: Normal rate and regular  rhythm.     Pulses: Normal pulses.     Heart sounds: Normal heart sounds.  Pulmonary:     Effort: Pulmonary effort is normal. No respiratory distress.     Breath sounds: Normal breath sounds. No wheezing.  Musculoskeletal:     Cervical back: Normal range of motion and neck supple.     Right knee: Bony tenderness and crepitus present. No swelling, deformity, effusion, erythema or ecchymosis. Normal range of motion. Tenderness present over the lateral joint line and patellar tendon. Normal meniscus and normal patellar mobility. Normal pulse.     Instability Tests: Anterior drawer test negative. Anterior Lachman test negative. Medial McMurray test negative and lateral McMurray test negative.     Right lower leg: No edema.     Left lower leg: No edema.     Comments: R knee- tender in patellofemoral area as well as patellar tendon and mostly in lateral joint line Some crepitus Nl rom -some pain with full flex Neg mc murray Stable on drawer/lachman exam    Lymphadenopathy:     Cervical: No cervical adenopathy.  Skin:    Coloration: Skin is not pale.     Findings: No erythema or rash.     Comments: Small plantar wart in arch of L foot  After consent obtained-cleaned and pared down (core visualized)- some relief    Neurological:     Mental Status: She is alert.     Sensory: No sensory deficit.     Motor: No weakness.     Coordination: Coordination normal.     Deep Tendon Reflexes: Reflexes normal.  Psychiatric:        Mood and Affect: Mood normal.           Assessment & Plan:   Problem List Items Addressed This Visit      Musculoskeletal and Integument   Plantar wart, left foot    After consent obtained- area cleaned and debrided with #11 scalpel  Some relief with this  Adv use of compound W or wart  plaster or other similar product otc as directed  Then debride gently with pumice stone Handout given  Will update/f/u if no improvement and will consider cryo tx         Other   Morbid obesity ([place])    No doubt worsens knee pain  Recommend continued work on wt loss       Right knee pain - Primary    Lateral pain w/o swelling  Worse with recent squats and high impact exercise  Disc minimizing trauma to knee Elevation/ice  Xray today- suspect OA Tylenol and voltaren gel recommended Given handout re: knee rehab (may also benefit from PT)  If no imp may be candidate for injection Wt loss enc      Relevant Orders   DG Knee 4 Views W/Patella Right

## 2020-08-02 NOTE — Patient Instructions (Addendum)
For now-avoid squats and high impact activities  Use the voltaren gel on knee up to four times daily  Ice and elevate 10 minutes when you can   Xray now =-will let you know when we have a reading tomorrow  Try the knee exercises and stretches   For wart- I pared down the dead skin on top  Get compound W or wart plaster or other other over the counter treatment and use regularly  Then a pumice stone to gently remove dead tissue

## 2020-08-02 NOTE — Assessment & Plan Note (Signed)
No doubt worsens knee pain  Recommend continued work on wt loss

## 2020-08-08 ENCOUNTER — Ambulatory Visit: Payer: Medicare HMO | Admitting: Dermatology

## 2020-08-08 ENCOUNTER — Other Ambulatory Visit: Payer: Self-pay

## 2020-08-08 ENCOUNTER — Encounter: Payer: Self-pay | Admitting: Dermatology

## 2020-08-08 DIAGNOSIS — L570 Actinic keratosis: Secondary | ICD-10-CM | POA: Diagnosis not present

## 2020-08-08 DIAGNOSIS — Z85828 Personal history of other malignant neoplasm of skin: Secondary | ICD-10-CM | POA: Diagnosis not present

## 2020-08-08 DIAGNOSIS — L814 Other melanin hyperpigmentation: Secondary | ICD-10-CM

## 2020-08-08 DIAGNOSIS — Z1283 Encounter for screening for malignant neoplasm of skin: Secondary | ICD-10-CM | POA: Diagnosis not present

## 2020-08-08 DIAGNOSIS — D1800 Hemangioma unspecified site: Secondary | ICD-10-CM

## 2020-08-08 DIAGNOSIS — L821 Other seborrheic keratosis: Secondary | ICD-10-CM | POA: Diagnosis not present

## 2020-08-08 DIAGNOSIS — I872 Venous insufficiency (chronic) (peripheral): Secondary | ICD-10-CM | POA: Diagnosis not present

## 2020-08-08 DIAGNOSIS — L578 Other skin changes due to chronic exposure to nonionizing radiation: Secondary | ICD-10-CM | POA: Diagnosis not present

## 2020-08-08 DIAGNOSIS — L820 Inflamed seborrheic keratosis: Secondary | ICD-10-CM | POA: Diagnosis not present

## 2020-08-08 DIAGNOSIS — D229 Melanocytic nevi, unspecified: Secondary | ICD-10-CM

## 2020-08-08 NOTE — Progress Notes (Signed)
   Follow-Up Visit   Subjective  [name] [name] is a 64 y.o. female who presents for the following: Annual Exam (Total body skin exam, hx of SCC R leg), check spot (nose, ~28m, no symptoms), and check new moles (chest, one is red). The spot on the chest peeled off and looks better now.  No symptoms.   The following portions of the chart were reviewed this encounter and updated as appropriate:      Review of Systems:  No other skin or systemic complaints except as noted in HPI or Assessment and Plan.  Objective  Well appearing patient in no apparent distress; mood and affect are within normal limits.  A full examination was performed including scalp, head, eyes, ears, nose, lips, neck, chest, axillae, abdomen, back, buttocks, bilateral upper extremities, bilateral lower extremities, hands, feet, fingers, toes, fingernails, and toenails. All findings within normal limits unless otherwise noted below.  Objective  L nasal tip x 1, R hand dorsum x 1, R medial lower canthus x 1 (3): Pink scaly macules   Objective  mid sternum, L forearm: Waxy pink scaly macule/papule  Objective  Right Lower Leg - Anterior: Well healed scar with no evidence of recurrence   Objective  bil lower legs: Stasis changes, brown discoloration L pretibia   Assessment & Plan   Melanocytic Nevi - Tan-brown and/or pink-flesh-colored symmetric macules and papules - Benign appearing on exam today - Observation - Call clinic for new or changing moles - Recommend daily use of broad spectrum spf 30+ sunscreen to sun-exposed areas.   Actinic Damage - diffuse scaly erythematous macules with underlying dyspigmentation - Recommend daily broad spectrum sunscreen SPF 30+ to sun-exposed areas, reapply every 2 hours as needed.  - Call for new or changing lesions.  Skin cancer screening performed today.  Lentigines - Scattered tan macules - Discussed due to sun exposure - Benign, observe - Call for any  changes  Seborrheic Keratoses - Stuck-on, waxy, tan-brown papules and plaques  - Discussed benign etiology and prognosis. - Observe - Call for any changes - R flank  Hemangiomas - Red papules - Discussed benign nature - Observe - Call for any changes  AK (actinic keratosis) (3) L nasal tip x 1, R hand dorsum x 1, R medial lower canthus x 1  Destruction of lesion - L nasal tip x 1, R hand dorsum x 1, R medial lower canthus x 1  Destruction method: cryotherapy   Informed consent: discussed and consent obtained   Lesion destroyed using liquid nitrogen: Yes   Region frozen until ice ball extended beyond lesion: Yes   Outcome: patient tolerated procedure well with no complications   Post-procedure details: wound care instructions given    Inflamed seborrheic keratosis mid sternum, L forearm  Resolving, observe  History of SCC (squamous cell carcinoma) of skin Right Lower Leg - Anterior  Clear. Observe for recurrence. Call clinic for new or changing lesions.  Recommend regular skin exams, daily broad-spectrum spf 30+ sunscreen use, and photoprotection.     Stasis dermatitis of both legs bil lower legs  Mild, discussed compression hose   Return in about 1 year (around 08/08/2021) for TBSE.   I, Othelia Pulling, RMA, am acting as scribe for Brendolyn Patty, MD . Documentation: I have reviewed the above documentation for accuracy and completeness, and I agree with the above.  Brendolyn Patty MD

## 2020-08-08 NOTE — Patient Instructions (Signed)
Cryotherapy Aftercare  . Wash gently with soap and water everyday.   . Apply Vaseline and Band-Aid daily until healed.  

## 2020-08-12 ENCOUNTER — Other Ambulatory Visit: Payer: Self-pay | Admitting: Family Medicine

## 2020-08-12 DIAGNOSIS — Z1231 Encounter for screening mammogram for malignant neoplasm of breast: Secondary | ICD-10-CM

## 2020-08-16 ENCOUNTER — Ambulatory Visit (INDEPENDENT_AMBULATORY_CARE_PROVIDER_SITE_OTHER): Payer: Medicare HMO | Admitting: Family Medicine

## 2020-08-16 ENCOUNTER — Ambulatory Visit
Admission: RE | Admit: 2020-08-16 | Discharge: 2020-08-16 | Disposition: A | Discharge status: Home / Self Care | Payer: Medicare HMO | Source: Ambulatory Visit | Attending: Family Medicine | Admitting: Family Medicine

## 2020-08-16 ENCOUNTER — Other Ambulatory Visit: Payer: Self-pay

## 2020-08-16 ENCOUNTER — Encounter: Payer: Self-pay | Admitting: Family Medicine

## 2020-08-16 VITALS — BP 130/70 | HR 68 | Temp 95.6°F | Ht 60.5 in | Wt 215.5 lb

## 2020-08-16 DIAGNOSIS — Z1231 Encounter for screening mammogram for malignant neoplasm of breast: Secondary | ICD-10-CM | POA: Diagnosis not present

## 2020-08-16 DIAGNOSIS — H6983 Other specified disorders of Eustachian tube, bilateral: Secondary | ICD-10-CM

## 2020-08-16 NOTE — Patient Instructions (Signed)
I think you have some sinus fluid in the middle ear causing sound change/discomfort   Use flonase nasal spray  Twice daily for 3 days  Then once daily through the fall/allergy season   If worse or increase pain -let us know  Update if not starting to improve in a week or if worsening  (call)

## 2020-08-16 NOTE — Assessment & Plan Note (Signed)
Pt has crackling/dec hearing worse in L ear with pressure (more mild on R) Effusions seen  tx with flonase ns- use bid for 3 d then qd through the allergy season  Watch for inc pain or other symptoms Also enc to get bite guard fixed -since her TMJ has caused ear symptoms in the past also Update if not starting to improve in a week or if worsening

## 2020-08-16 NOTE — Progress Notes (Signed)
Subjective:    Patient ID: [name] [name], female    DOB: 10/12/1956, 64 y.o.   MRN: [id number]  This visit occurred during the SARS-CoV-2 public health emergency.  Safety protocols were in place, including screening questions prior to the visit, additional usage of staff PPE, and extensive cleaning of exam room while observing appropriate contact time as indicated for disinfecting solutions.    HPI Pt presents with L ear pain for 4 days  Actually more crackling (like radio) , slight pain and feels full Has had ear wax problems in the past   Had to hold hearing aide due to the crackling sound   No pnd or congestion  No fever   Used flonase once on Sunday   She has TMJ and did stop wearing bite guard a mo ago   Patient Active Problem List   Diagnosis Date Noted  . Right knee pain 08/02/2020  . Plantar wart, left foot 08/02/2020  . Bipolar disorder, in partial remission, most recent episode mixed ([place]) 02/25/2020  . Epigastric pain 09/24/2019  . Chronic diarrhea 09/24/2019  . Contusion of left lower leg 01/05/2019  . Breast lump on left side at 5 o'clock position 08/25/2018  . Elevated glucose level 03/03/2018  . Trochanteric bursitis 01/07/2018  . Low back pain 12/23/2017  . Thoracic back pain 12/23/2017  . Atrophic vaginitis 07/08/2017  . TMJ (temporomandibular joint disorder) 07/08/2017  . Cervical stenosis (uterine cervix) 01/03/2017  . Left anterior knee pain 10/08/2016  . Left knee pain 07/24/2016  . Hearing loss 10/25/2015  . Routine general medical examination at a health care facility 10/17/2015  . Encounter for routine gynecological examination 10/22/2014  . Elevated serum creatinine 10/22/2014  . Colon cancer screening 10/16/2013  . ETD (eustachian tube dysfunction) 04/11/2012  . Encounter for Medicare annual wellness exam 10/04/2011  . METATARSALGIA 12/01/2009  . POSTMENOPAUSAL STATUS 09/30/2009  . ARTHRITIS, RIGHT HIP 03/10/2009  . DEGENERATIVE DISC  DISEASE, LUMBAR SPINE 03/10/2009  . ONYCHOMYCOSIS 09/17/2008  . Hypothyroidism 08/05/2007  . Morbid obesity ([place]) 08/05/2007  . Bipolar disorder ([place]) 08/05/2007  . ALLERGIC RHINITIS 08/05/2007  . GERD 08/05/2007  . IBS 08/05/2007  . HYPERCHOLESTEROLEMIA, PURE 07/07/2007   Past Medical History:  Diagnosis Date  . Allergic rhinitis   . Bipolar 1 disorder ([place])   . Fissure, anal   . GERD (gastroesophageal reflux disease)   . History of basal cell carcinoma excision    ON FACE  . History of squamous cell carcinoma excision    ON RIGHT LEG  . Hyperlipidemia   . Hypothyroidism   . IBS (irritable bowel syndrome)   . Liver cyst   . Mallet finger of left hand    small finger  . Squamous cell carcinoma of skin    R leg   Past Surgical History:  Procedure Laterality Date  . CARDIAC CATHETERIZATION  04/1999   per pt normal  . CARDIOVASCULAR STRESS TEST  02-23-2011  dr Chrissie Noa [facility] clinic)   normal nuclear study/  no ischemia/  ef 64%  . CATARACT EXTRACTION W/ INTRAOCULAR LENS  IMPLANT, BILATERAL  2003   Bil  . CESAREAN SECTION  1984  . CLOSED REDUCTION FINGER WITH PERCUTANEOUS PINNING Left 04/22/2014   Procedure: LEFT SMALL FINGER CLOSED REDUCTION PINNING;  Surgeon: Linna Hoff, MD;  Location: [place];  Service: Orthopedics;  Laterality: Left;  . COLONOSCOPY  11/2002  . LAPAROSCOPIC CHOLECYSTECTOMY  03-18-2000  . TRANSTHORACIC ECHOCARDIOGRAM  02-23-2011  normal lvf/  ef 58%/  mild rve/   mild lae/  mild  mr  & tr/  mild pulmonary htn  . WISDOM TOOTH EXTRACTION     Social History   Tobacco Use  . Smoking status: Never Smoker  . Smokeless tobacco: Never Used  Vaping Use  . Vaping Use: Never used  Substance Use Topics  . Alcohol use: Yes    Alcohol/week: 0.0 standard drinks    Comment: wine- rarely  . Drug use: No   Family History  Problem Relation Age of Onset  . Stroke Mother   . Heart disease Mother   . Hypertension Father   .  Diabetes Father   . Hyperlipidemia Father   . Diabetes Brother    Allergies  Allergen Reactions  . Xylocaine [Lidocaine Hcl] Swelling    ALLERGIC TO ALL CAINES EXCEPT SENSORCAINE , AND MARCAINE  . Codeine Nausea And Vomiting    REACTION: vomits blood  . Lithium Swelling  . Tegretol [Carbamazepine] Other (See Comments)    REACTION: skin crawls   Current Outpatient Medications on File Prior to Visit  Medication Sig Dispense Refill  . ARIPiprazole (ABILIFY) 15 MG tablet Take 1 tablet by mouth every other day. (30 mg on the days she doesn't take 15 mg)    . ARIPiprazole (ABILIFY) 30 MG tablet Take 30 mg by mouth every other day. (15 mg on the days she doesn't take 30mg )    . aspirin 81 MG tablet Take 81 mg by mouth daily.      Marland Kitchen Bioflavonoid Products (BIOFLEX PO) Take by mouth in the morning and at bedtime.    Marland Kitchen BIOTIN PO Take 1 tablet by mouth daily.    Marland Kitchen buPROPion (WELLBUTRIN XL) 150 MG 24 hr tablet Take three tablets by mouth every morning    . cetirizine (ZYRTEC) 10 MG tablet Take 10 mg by mouth daily as needed for allergies.    . Cholecalciferol (VITAMIN D) 2000 UNITS CAPS Take 1 capsule by mouth daily.      . clonazePAM (KLONOPIN) 1 MG tablet Take 1 mg by mouth at bedtime as needed.      . Coenzyme Q10 (COQ10 PO) Take 100 mg by mouth daily.    . fluticasone (FLONASE) 50 MCG/ACT nasal spray Place 2 sprays into both nostrils daily as needed. 48 g 3  . lansoprazole (PREVACID) 30 MG capsule Take 1 capsule (30 mg total) by mouth daily. 90 capsule 3  . levothyroxine (SYNTHROID) 50 MCG tablet Take 1 tablet (50 mcg total) by mouth daily. 90 tablet 3  . lovastatin (MEVACOR) 40 MG tablet Take 1 tablet (40 mg total) by mouth daily. 90 tablet 3  . Multiple Vitamin (MULTIVITAMIN) tablet Take 1 tablet by mouth daily.      Marland Kitchen tiZANidine (ZANAFLEX) 4 MG tablet Take 1 tablet by mouth at bedtime as needed.  0  . zolpidem (AMBIEN) 10 MG tablet Take 10 mg by mouth at bedtime.      No current  facility-administered medications on file prior to visit.    Review of Systems  Constitutional: Negative for activity change, appetite change, fatigue, fever and unexpected weight change.  HENT: Positive for ear pain and hearing loss. Negative for congestion, drooling, rhinorrhea, sinus pressure and sore throat.   Eyes: Negative for pain, redness and visual disturbance.  Respiratory: Negative for cough, shortness of breath and wheezing.   Cardiovascular: Negative for chest pain and palpitations.  Gastrointestinal: Negative for abdominal pain, blood in  stool, constipation and diarrhea.  Endocrine: Negative for polydipsia and polyuria.  Genitourinary: Negative for dysuria, frequency and urgency.  Musculoskeletal: Negative for arthralgias, back pain and myalgias.  Skin: Negative for pallor and rash.  Allergic/Immunologic: Negative for environmental allergies.  Neurological: Negative for dizziness, syncope and headaches.  Hematological: Negative for adenopathy. Does not bruise/bleed easily.  Psychiatric/Behavioral: Negative for decreased concentration and dysphoric mood. The patient is not nervous/anxious.        Objective:   Physical Exam Constitutional:      General: She is not in acute distress.    Appearance: Normal appearance. She is obese. She is not ill-appearing.  HENT:     Head: Normocephalic and atraumatic.     Comments: No sinus tenderness  Mild TMJ tenderness w/o clicking or popping    Right Ear: Ear canal and external ear normal. There is no impacted cerumen.     Left Ear: Ear canal and external ear normal. There is no impacted cerumen.     Ears:     Comments: Small effusions bilat- worse on L  Decreased hearing to soft sound bilat  No erythema or bulgind of TM    Nose: Nose normal.     Comments: Boggy nares    Mouth/Throat:     Mouth: Mucous membranes are moist.     Pharynx: Oropharynx is clear. No posterior oropharyngeal erythema.  Eyes:     General:        Right  eye: No discharge.        Left eye: No discharge.     Conjunctiva/sclera: Conjunctivae normal.     Pupils: Pupils are equal, round, and reactive to light.  Neck:     Vascular: No carotid bruit.  Cardiovascular:     Rate and Rhythm: Normal rate and regular rhythm.  Pulmonary:     Effort: Pulmonary effort is normal. No respiratory distress.     Breath sounds: No wheezing.  Musculoskeletal:     Cervical back: Normal range of motion.  Lymphadenopathy:     Cervical: No cervical adenopathy.  Skin:    General: Skin is warm and dry.     Findings: No erythema or rash.  Neurological:     Mental Status: She is alert.     Cranial Nerves: No cranial nerve deficit.  Psychiatric:        Mood and Affect: Mood normal.           Assessment & Plan:   Problem List Items Addressed This Visit      Nervous and Auditory   ETD (eustachian tube dysfunction) - Primary    Pt has crackling/dec hearing worse in L ear with pressure (more mild on R) Effusions seen  tx with flonase ns- use bid for 3 d then qd through the allergy season  Watch for inc pain or other symptoms Also enc to get bite guard fixed -since her TMJ has caused ear symptoms in the past also Update if not starting to improve in a week or if worsening

## 2020-08-23 ENCOUNTER — Ambulatory Visit (INDEPENDENT_AMBULATORY_CARE_PROVIDER_SITE_OTHER): Payer: Medicare HMO | Admitting: Psychology

## 2020-08-23 DIAGNOSIS — F3132 Bipolar disorder, current episode depressed, moderate: Secondary | ICD-10-CM | POA: Diagnosis not present

## 2020-08-25 ENCOUNTER — Telehealth: Payer: Self-pay

## 2020-08-25 NOTE — Telephone Encounter (Signed)
Pt had been using compound W to remove wart on foot; now the skin around the wart area about size of quarter is raw and 2 quarters deep into the skin is raw and exposed;no signs of infection. Pt wants to know if should stop compound W. Advised yes should stop the compound W and Dr Glori Bickers said to wash the area with soap and water; dry area and apply abx ointment with telfa pad; if any signs of infection or no improvement next wk pt will cb for appt.pt voiced understanding. FYI to Dr Glori Bickers.

## 2020-08-25 NOTE — Telephone Encounter (Signed)
Agree with advisement

## 2020-09-20 ENCOUNTER — Ambulatory Visit: Payer: Self-pay | Admitting: Psychology

## 2020-10-25 ENCOUNTER — Ambulatory Visit: Payer: Medicare HMO | Admitting: Psychology

## 2020-11-15 ENCOUNTER — Ambulatory Visit (INDEPENDENT_AMBULATORY_CARE_PROVIDER_SITE_OTHER): Payer: Medicare HMO | Admitting: Psychology

## 2020-11-15 DIAGNOSIS — F3132 Bipolar disorder, current episode depressed, moderate: Secondary | ICD-10-CM

## 2020-11-21 ENCOUNTER — Ambulatory Visit: Payer: Medicare HMO | Admitting: Psychology

## 2020-11-28 ENCOUNTER — Ambulatory Visit: Payer: Medicare HMO | Admitting: Psychology

## 2020-12-13 ENCOUNTER — Ambulatory Visit: Payer: Medicare HMO | Admitting: Psychology

## 2021-01-03 ENCOUNTER — Encounter: Payer: Self-pay | Admitting: Family Medicine

## 2021-01-03 ENCOUNTER — Ambulatory Visit (INDEPENDENT_AMBULATORY_CARE_PROVIDER_SITE_OTHER): Payer: Medicare Other | Admitting: Family Medicine

## 2021-01-03 ENCOUNTER — Other Ambulatory Visit: Payer: Self-pay

## 2021-01-03 VITALS — BP 122/80 | HR 59 | Temp 97.0°F | Ht 60.5 in | Wt 218.0 lb

## 2021-01-03 DIAGNOSIS — M545 Low back pain, unspecified: Secondary | ICD-10-CM

## 2021-01-03 DIAGNOSIS — G8929 Other chronic pain: Secondary | ICD-10-CM | POA: Diagnosis not present

## 2021-01-03 NOTE — Progress Notes (Signed)
Subjective:    Patient ID: [name] [name], female    DOB: June 15, 1956, 65 y.o.   MRN: [id number]  This visit occurred during the SARS-CoV-2 public health emergency.  Safety protocols were in place, including screening questions prior to the visit, additional usage of staff PPE, and extensive cleaning of exam room while observing appropriate contact time as indicated for disinfecting solutions.    HPI Pt presents with low back pain   Wt Readings from Last 3 Encounters:  01/03/21 218 lb (98.9 kg)  08/16/20 215 lb 8 oz (97.8 kg)  08/02/20 217 lb 1 oz (98.5 kg)   41.87 kg/m  Left lower back  Improved when lying down   Hurts to move/walk or cough   Started jan 15 No injury or change in activity (she goes to [employer] for exercise) She has had back pain before   Pain does not rad to leg  No numbness  Not weak (limited by pain) Pain is sharp in nature   No urinary symptoms at all   Took 2 es tylenol  Takes tizanadine prn  Also used diclofenac gel  Not very helpful  Has not tried heat or ice   Patient Active Problem List   Diagnosis Date Noted  . Right knee pain 08/02/2020  . Plantar wart, left foot 08/02/2020  . Bipolar disorder, in partial remission, most recent episode mixed ([place]) 02/25/2020  . Epigastric pain 09/24/2019  . Chronic diarrhea 09/24/2019  . Contusion of left lower leg 01/05/2019  . Breast lump on left side at 5 o'clock position 08/25/2018  . Elevated glucose level 03/03/2018  . Trochanteric bursitis 01/07/2018  . Low back pain 12/23/2017  . Thoracic back pain 12/23/2017  . Atrophic vaginitis 07/08/2017  . TMJ (temporomandibular joint disorder) 07/08/2017  . Cervical stenosis (uterine cervix) 01/03/2017  . Left anterior knee pain 10/08/2016  . Left knee pain 07/24/2016  . Hearing loss 10/25/2015  . Routine general medical examination at a health care facility 10/17/2015  . Encounter for routine gynecological examination 10/22/2014  .  Elevated serum creatinine 10/22/2014  . Colon cancer screening 10/16/2013  . ETD (eustachian tube dysfunction) 04/11/2012  . Encounter for Medicare annual wellness exam 10/04/2011  . METATARSALGIA 12/01/2009  . POSTMENOPAUSAL STATUS 09/30/2009  . ARTHRITIS, RIGHT HIP 03/10/2009  . DEGENERATIVE DISC DISEASE, LUMBAR SPINE 03/10/2009  . ONYCHOMYCOSIS 09/17/2008  . Hypothyroidism 08/05/2007  . Morbid obesity ([place]) 08/05/2007  . Bipolar disorder ([place]) 08/05/2007  . ALLERGIC RHINITIS 08/05/2007  . GERD 08/05/2007  . IBS 08/05/2007  . HYPERCHOLESTEROLEMIA, PURE 07/07/2007   Past Medical History:  Diagnosis Date  . Allergic rhinitis   . Bipolar 1 disorder ([place])   . Fissure, anal   . GERD (gastroesophageal reflux disease)   . History of basal cell carcinoma excision    ON FACE  . History of squamous cell carcinoma excision    ON RIGHT LEG  . Hyperlipidemia   . Hypothyroidism   . IBS (irritable bowel syndrome)   . Liver cyst   . Mallet finger of left hand    small finger  . Squamous cell carcinoma of skin    R leg   Past Surgical History:  Procedure Laterality Date  . CARDIAC CATHETERIZATION  04/1999   per pt normal  . CARDIOVASCULAR STRESS TEST  02-23-2011  dr Chrissie Noa [facility] clinic)   normal nuclear study/  no ischemia/  ef 64%  . CATARACT EXTRACTION W/ INTRAOCULAR LENS  IMPLANT, BILATERAL  2003   [place]il  . CESAREAN SECTION  1984  . CLOSED REDUCTION FINGER WITH PERCUTANEOUS PINNING Left 04/22/2014   Procedure: LEFT SMALL FINGER CLOSED REDUCTION PINNING;  Surgeon: Linna Hoff, MD;  Location: [facility];  Service: Orthopedics;  Laterality: Left;  . COLONOSCOPY  11/2002  . LAPAROSCOPIC CHOLECYSTECTOMY  03-18-2000  . TRANSTHORACIC ECHOCARDIOGRAM  02-23-2011   normal lvf/  ef 58%/  mild rve/   mild lae/  mild  mr  & tr/  mild pulmonary htn  . WISDOM TOOTH EXTRACTION     Social History   Tobacco Use  . Smoking status: Never Smoker  . Smokeless tobacco:  Never Used  Vaping Use  . Vaping Use: Never used  Substance Use Topics  . Alcohol use: Yes    Alcohol/week: 0.0 standard drinks    Comment: wine- rarely  . Drug use: No   Family History  Problem Relation Age of Onset  . Stroke Mother   . Heart disease Mother   . Hypertension Father   . Diabetes Father   . Hyperlipidemia Father   . Diabetes Brother    Allergies  Allergen Reactions  . Xylocaine [Lidocaine Hcl] Swelling    ALLERGIC TO ALL CAINES EXCEPT SENSORCAINE , AND MARCAINE  . Codeine Nausea And Vomiting    REACTION: vomits blood  . Lithium Swelling  . Tegretol [Carbamazepine] Other (See Comments)    REACTION: skin crawls   Current Outpatient Medications on File Prior to Visit  Medication Sig Dispense Refill  . ARIPiprazole (ABILIFY) 15 MG tablet Take 1 tablet by mouth every other day. (30 mg on the days she doesn't take 15 mg)    . ARIPiprazole (ABILIFY) 30 MG tablet Take 30 mg by mouth every other day. (15 mg on the days she doesn't take 30mg )    . aspirin 81 MG tablet Take 81 mg by mouth daily.    Marland Kitchen Bioflavonoid Products (BIOFLEX PO) Take by mouth in the morning and at bedtime.    Marland Kitchen BIOTIN PO Take 1 tablet by mouth daily.    Marland Kitchen buPROPion (WELLBUTRIN XL) 150 MG 24 hr tablet Take three tablets by mouth every morning    . cetirizine (ZYRTEC) 10 MG tablet Take 10 mg by mouth daily as needed for allergies.    . Cholecalciferol (VITAMIN D) 2000 UNITS CAPS Take 1 capsule by mouth daily.    . clonazePAM (KLONOPIN) 1 MG tablet Take 1 mg by mouth at bedtime as needed.    . Coenzyme Q10 (COQ10 PO) Take 100 mg by mouth daily.    . fluticasone (FLONASE) 50 MCG/ACT nasal spray Place 2 sprays into both nostrils daily as needed. 48 g 3  . lamoTRIgine (LAMICTAL) 100 MG tablet Take 100 mg by mouth daily.    . lansoprazole (PREVACID) 30 MG capsule Take 1 capsule (30 mg total) by mouth daily. 90 capsule 3  . levothyroxine (SYNTHROID) 50 MCG tablet Take 1 tablet (50 mcg total) by mouth  daily. 90 tablet 3  . lovastatin (MEVACOR) 40 MG tablet Take 1 tablet (40 mg total) by mouth daily. 90 tablet 3  . Multiple Vitamin (MULTIVITAMIN) tablet Take 1 tablet by mouth daily.    Marland Kitchen tiZANidine (ZANAFLEX) 4 MG tablet Take 1 tablet by mouth at bedtime as needed.  0  . zolpidem (AMBIEN) 10 MG tablet Take 10 mg by mouth at bedtime.     No current facility-administered medications on file prior to visit.    Review  of Systems  Constitutional: Negative for activity change, appetite change, fatigue, fever and unexpected weight change.  HENT: Negative for congestion, ear pain, rhinorrhea, sinus pressure and sore throat.   Eyes: Negative for pain, redness and visual disturbance.  Respiratory: Negative for cough, shortness of breath and wheezing.   Cardiovascular: Negative for chest pain and palpitations.  Gastrointestinal: Negative for abdominal pain, blood in stool, constipation and diarrhea.  Endocrine: Negative for polydipsia and polyuria.  Genitourinary: Negative for dysuria, frequency and urgency.  Musculoskeletal: Positive for back pain. Negative for arthralgias, joint swelling and myalgias.  Skin: Negative for pallor and rash.  Allergic/Immunologic: Negative for environmental allergies.  Neurological: Negative for dizziness, syncope and headaches.  Hematological: Negative for adenopathy. Does not bruise/bleed easily.  Psychiatric/Behavioral: Negative for decreased concentration and dysphoric mood. The patient is not nervous/anxious.        Objective:   Physical Exam Constitutional:      General: She is not in acute distress.    Appearance: Normal appearance. She is well-developed and well-nourished. She is obese.  HENT:     Head: Normocephalic and atraumatic.  Eyes:     General: No scleral icterus.    Extraocular Movements: EOM normal.     Conjunctiva/sclera: Conjunctivae normal.     Pupils: Pupils are equal, round, and reactive to light.  Cardiovascular:     Rate and  Rhythm: Normal rate and regular rhythm.  Pulmonary:     Effort: Pulmonary effort is normal.     Breath sounds: Normal breath sounds. No wheezing or rales.  Abdominal:     General: Bowel sounds are normal. There is no distension.     Palpations: Abdomen is soft.     Tenderness: There is no abdominal tenderness.  Musculoskeletal:        General: Tenderness present.     Cervical back: Normal range of motion and neck supple.     Lumbar back: Spasms, tenderness and bony tenderness present. No swelling or edema. Decreased range of motion. Scoliosis present.     Right lower leg: No edema.     Left lower leg: No edema.     Comments: Tender over mid LS and L lumbar musculature  Pain with flex 40 deg and ext 10 deg  Also with L lateral bend (marked)  Nl gait  Some back pain with R SLR Nl rom hips   Lymphadenopathy:     Cervical: No cervical adenopathy.  Skin:    General: Skin is warm and dry.     Coloration: Skin is not pale.     Findings: No erythema or rash.  Neurological:     Mental Status: She is alert.     Cranial Nerves: No cranial nerve deficit.     Sensory: No sensory deficit.     Motor: No weakness, atrophy or abnormal muscle tone.     Coordination: Coordination normal.     Gait: Gait normal.     Deep Tendon Reflexes: Strength normal and reflexes are normal and symmetric.  Psychiatric:        Mood and Affect: Mood and affect and mood normal.           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

## 2021-01-03 NOTE — Patient Instructions (Addendum)
Continue tylenol and the voltaren gel  The muscle relaxer is ok also  Try heat for 10 minutes at a time   Try the stretches/exercises  If there are exercises that hurt too much -then hold off that particular one   If not improving in a week please let us know

## 2021-01-03 NOTE — Assessment & Plan Note (Signed)
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

## 2021-01-04 ENCOUNTER — Ambulatory Visit: Payer: Medicare HMO | Admitting: Family Medicine

## 2021-01-10 ENCOUNTER — Telehealth: Payer: Self-pay | Admitting: *Deleted

## 2021-01-10 DIAGNOSIS — G8929 Other chronic pain: Secondary | ICD-10-CM

## 2021-01-10 NOTE — Telephone Encounter (Signed)
Pt notified referral done and our [facility] will call to schedule appt, pt said if possible she wants to go to [facility] in [place], [facility] aware

## 2021-01-10 NOTE — Telephone Encounter (Signed)
Patient left a voicemail stating that she was recently seen. Patient stated that she was advised to call back if she wanted physical therapy.

## 2021-01-10 NOTE — Telephone Encounter (Signed)
I put the referral in  Please note if she pref [facility] or [facility]

## 2021-01-17 ENCOUNTER — Ambulatory Visit (INDEPENDENT_AMBULATORY_CARE_PROVIDER_SITE_OTHER): Payer: Medicare Other | Admitting: Psychology

## 2021-01-17 DIAGNOSIS — F3132 Bipolar disorder, current episode depressed, moderate: Secondary | ICD-10-CM

## 2021-01-18 DIAGNOSIS — R2689 Other abnormalities of gait and mobility: Secondary | ICD-10-CM | POA: Diagnosis not present

## 2021-01-18 DIAGNOSIS — M6281 Muscle weakness (generalized): Secondary | ICD-10-CM | POA: Diagnosis not present

## 2021-01-18 DIAGNOSIS — M545 Low back pain, unspecified: Secondary | ICD-10-CM | POA: Diagnosis not present

## 2021-01-23 DIAGNOSIS — R2689 Other abnormalities of gait and mobility: Secondary | ICD-10-CM | POA: Diagnosis not present

## 2021-01-23 DIAGNOSIS — M545 Low back pain, unspecified: Secondary | ICD-10-CM | POA: Diagnosis not present

## 2021-01-23 DIAGNOSIS — M6281 Muscle weakness (generalized): Secondary | ICD-10-CM | POA: Diagnosis not present

## 2021-01-25 DIAGNOSIS — M6281 Muscle weakness (generalized): Secondary | ICD-10-CM | POA: Diagnosis not present

## 2021-01-25 DIAGNOSIS — R2689 Other abnormalities of gait and mobility: Secondary | ICD-10-CM | POA: Diagnosis not present

## 2021-01-25 DIAGNOSIS — M545 Low back pain, unspecified: Secondary | ICD-10-CM | POA: Diagnosis not present

## 2021-01-30 DIAGNOSIS — M545 Low back pain, unspecified: Secondary | ICD-10-CM | POA: Diagnosis not present

## 2021-01-30 DIAGNOSIS — R2689 Other abnormalities of gait and mobility: Secondary | ICD-10-CM | POA: Diagnosis not present

## 2021-01-30 DIAGNOSIS — M6281 Muscle weakness (generalized): Secondary | ICD-10-CM | POA: Diagnosis not present

## 2021-02-01 DIAGNOSIS — M6281 Muscle weakness (generalized): Secondary | ICD-10-CM | POA: Diagnosis not present

## 2021-02-01 DIAGNOSIS — M545 Low back pain, unspecified: Secondary | ICD-10-CM | POA: Diagnosis not present

## 2021-02-01 DIAGNOSIS — R2689 Other abnormalities of gait and mobility: Secondary | ICD-10-CM | POA: Diagnosis not present

## 2021-02-07 ENCOUNTER — Ambulatory Visit (INDEPENDENT_AMBULATORY_CARE_PROVIDER_SITE_OTHER): Payer: Medicare Other | Admitting: Psychology

## 2021-02-07 DIAGNOSIS — F3132 Bipolar disorder, current episode depressed, moderate: Secondary | ICD-10-CM

## 2021-03-03 ENCOUNTER — Other Ambulatory Visit: Payer: Self-pay

## 2021-03-03 ENCOUNTER — Ambulatory Visit
Admission: EM | Admit: 2021-03-03 | Discharge: 2021-03-03 | Disposition: A | Discharge status: Home / Self Care | Payer: Medicare Other | Attending: Family Medicine | Admitting: Family Medicine

## 2021-03-03 ENCOUNTER — Encounter: Payer: Self-pay | Admitting: Emergency Medicine

## 2021-03-03 DIAGNOSIS — R0981 Nasal congestion: Secondary | ICD-10-CM | POA: Diagnosis not present

## 2021-03-03 DIAGNOSIS — J029 Acute pharyngitis, unspecified: Secondary | ICD-10-CM | POA: Diagnosis not present

## 2021-03-03 DIAGNOSIS — Z7689 Persons encountering health services in other specified circumstances: Secondary | ICD-10-CM | POA: Diagnosis not present

## 2021-03-03 LAB — POCT RAPID STREP A (OFFICE): Rapid Strep A Screen: NEGATIVE

## 2021-03-03 NOTE — Discharge Instructions (Addendum)
I believe this is allergies Continue the Flonase and add in zyrtec or Claritin daily.  Follow up as needed for continued or worsening symptoms

## 2021-03-03 NOTE — ED Provider Notes (Signed)
[name] [name]    CSN: [id number] Arrival date & time: 03/03/21  1024      History   Chief Complaint Chief Complaint  Patient presents with  . Nasal Congestion  . Sore Throat    HPI [name] [name] is a 65 y.o. female.   Pt is a 65 year old female that presents with nasal congestion, sore throat, PND for 2 days.  Pain with swallowing.  No ear pain, headache, cough, congestion, fever.  Has been using Flonase without relief. Hx of seasonal allergies.    Sore Throat    Past Medical History:  Diagnosis Date  . Allergic rhinitis   . Basal cell carcinoma 10/16/2010   R lower pretibia   . Bipolar 1 disorder ([place])   . Fissure, anal   . GERD (gastroesophageal reflux disease)   . History of basal cell carcinoma excision    ON FACE  . History of squamous cell carcinoma excision    ON RIGHT LEG  . Hyperlipidemia   . Hypothyroidism   . IBS (irritable bowel syndrome)   . Liver cyst   . Mallet finger of left hand    small finger    Patient Active Problem List   Diagnosis Date Noted  . Right knee pain 08/02/2020  . Plantar wart, left foot 08/02/2020  . Bipolar disorder, in partial remission, most recent episode mixed ([place]) 02/25/2020  . Epigastric pain 09/24/2019  . Chronic diarrhea 09/24/2019  . Contusion of left lower leg 01/05/2019  . Breast lump on left side at 5 o'clock position 08/25/2018  . Elevated glucose level 03/03/2018  . Trochanteric bursitis 01/07/2018  . Low back pain 12/23/2017  . Thoracic back pain 12/23/2017  . Atrophic vaginitis 07/08/2017  . TMJ (temporomandibular joint disorder) 07/08/2017  . Cervical stenosis (uterine cervix) 01/03/2017  . Left anterior knee pain 10/08/2016  . Left knee pain 07/24/2016  . Hearing loss 10/25/2015  . Routine general medical examination at a health care facility 10/17/2015  . Encounter for routine gynecological examination 10/22/2014  . Elevated serum creatinine 10/22/2014  . Colon cancer screening  10/16/2013  . ETD (eustachian tube dysfunction) 04/11/2012  . Encounter for Medicare annual wellness exam 10/04/2011  . METATARSALGIA 12/01/2009  . POSTMENOPAUSAL STATUS 09/30/2009  . ARTHRITIS, RIGHT HIP 03/10/2009  . DEGENERATIVE DISC DISEASE, LUMBAR SPINE 03/10/2009  . ONYCHOMYCOSIS 09/17/2008  . Hypothyroidism 08/05/2007  . Morbid obesity ([place]) 08/05/2007  . Bipolar disorder ([place]) 08/05/2007  . ALLERGIC RHINITIS 08/05/2007  . GERD 08/05/2007  . IBS 08/05/2007  . HYPERCHOLESTEROLEMIA, PURE 07/07/2007    Past Surgical History:  Procedure Laterality Date  . CARDIAC CATHETERIZATION  04/1999   per pt normal  . CARDIOVASCULAR STRESS TEST  02-23-2011  dr Chrissie Noa [facility] clinic)   normal nuclear study/  no ischemia/  ef 64%  . CATARACT EXTRACTION W/ INTRAOCULAR LENS  IMPLANT, BILATERAL  2003   [facility]il  . CESAREAN SECTION  1984  . CLOSED REDUCTION FINGER WITH PERCUTANEOUS PINNING Left 04/22/2014   Procedure: LEFT SMALL FINGER CLOSED REDUCTION PINNING;  Surgeon: Linna Hoff, MD;  Location: [facility];  Service: Orthopedics;  Laterality: Left;  . COLONOSCOPY  11/2002  . LAPAROSCOPIC CHOLECYSTECTOMY  03-18-2000  . TRANSTHORACIC ECHOCARDIOGRAM  02-23-2011   normal lvf/  ef 58%/  mild rve/   mild lae/  mild  mr  & tr/  mild pulmonary htn  . WISDOM TOOTH EXTRACTION      OB History  Gravida  1   Para  1   Term  1   Preterm      AB      Living  1     SAB      IAB      Ectopic      Multiple      Live Births  1            Home Medications    Prior to Admission medications   Medication Sig Start Date End Date Taking? Authorizing Provider  ARIPiprazole (ABILIFY) 15 MG tablet Take 1 tablet by mouth every other day. (30 mg on the days she doesn't take 15 mg) 03/01/20   [provider]  ARIPiprazole (ABILIFY) 30 MG tablet Take 30 mg by mouth every other day. (15 mg on the days she doesn't take 30mg )    [provider]  aspirin  81 MG tablet Take 81 mg by mouth daily.    [provider]  Bioflavonoid Products (BIOFLEX PO) Take by mouth in the morning and at bedtime.    [provider]  BIOTIN PO Take 1 tablet by mouth daily.    [provider]  buPROPion (WELLBUTRIN XL) 150 MG 24 hr tablet Take three tablets by mouth every morning    [provider]  cetirizine (ZYRTEC) 10 MG tablet Take 10 mg by mouth daily as needed for allergies.    [provider]  Cholecalciferol (VITAMIN D) 2000 UNITS CAPS Take 1 capsule by mouth daily.    [provider]  clonazePAM (KLONOPIN) 1 MG tablet Take 1 mg by mouth at bedtime as needed.    [provider]  Coenzyme Q10 (COQ10 PO) Take 100 mg by mouth daily.    [provider]  fluticasone (FLONASE) 50 MCG/ACT nasal spray Place 2 sprays into both nostrils daily as needed. 02/25/20   Tower, Wynelle Fanny, MD  lamoTRIgine (LAMICTAL) 100 MG tablet Take 100 mg by mouth daily. 12/16/20   [provider]  lansoprazole (PREVACID) 30 MG capsule Take 1 capsule (30 mg total) by mouth daily. 05/19/20   Tower, Wynelle Fanny, MD  levothyroxine (SYNTHROID) 50 MCG tablet Take 1 tablet (50 mcg total) by mouth daily. 05/19/20   Tower, Wynelle Fanny, MD  lovastatin (MEVACOR) 40 MG tablet Take 1 tablet (40 mg total) by mouth daily. 05/19/20   Tower, Wynelle Fanny, MD  Multiple Vitamin (MULTIVITAMIN) tablet Take 1 tablet by mouth daily.    [provider]  tiZANidine (ZANAFLEX) 4 MG tablet Take 1 tablet by mouth at bedtime as needed. 08/03/18   [provider]  zolpidem (AMBIEN) 10 MG tablet Take 10 mg by mouth at bedtime.    [provider]    Family History Family History  Problem Relation Age of Onset  . Stroke Mother   . Heart disease Mother   . Hypertension Father   . Diabetes Father   . Hyperlipidemia Father   . Diabetes Brother     Social History Social History   Tobacco Use  . Smoking status: Never Smoker  .  Smokeless tobacco: Never Used  Vaping Use  . Vaping Use: Never used  Substance Use Topics  . Alcohol use: Yes    Alcohol/week: 0.0 standard drinks    Comment: wine- rarely  . Drug use: No     Allergies   Xylocaine [lidocaine hcl], Codeine, Lithium, and Tegretol [carbamazepine]   Review of Systems Review of Systems   Physical  Exam Triage Vital Signs ED Triage Vitals  Enc Vitals Group     BP 03/03/21 1044 133/78     Pulse Rate 03/03/21 1044 66     Resp 03/03/21 1044 19     Temp 03/03/21 1044 99 F (37.2 C)     Temp Source 03/03/21 1044 Oral     SpO2 03/03/21 1044 95 %     Weight --      Height --      Head Circumference --      Peak Flow --      Pain Score 03/03/21 1042 2     Pain Loc --      Pain Edu? --      Excl. in [place]? --    No data found.  Updated Vital Signs BP 133/78 (BP Location: Left Arm)   Pulse 66   Temp 99 F (37.2 C) (Oral)   Resp 19   SpO2 95%   Visual Acuity Right Eye Distance:   Left Eye Distance:   Bilateral Distance:    Right Eye Near:   Left Eye Near:    Bilateral Near:     Physical Exam Vitals and nursing note reviewed.  Constitutional:      General: She is not in acute distress.    Appearance: Normal appearance. She is not ill-appearing, toxic-appearing or diaphoretic.  HENT:     Head: Normocephalic.     Right Ear: Ear canal normal.     Left Ear: Ear canal normal.     Ears:     Comments: Bilateral effusion    Nose: Nose normal.     Mouth/Throat:     Pharynx: Oropharynx is clear. Uvula midline.  Eyes:     Conjunctiva/sclera: Conjunctivae normal.  Cardiovascular:     Rate and Rhythm: Normal rate and regular rhythm.  Pulmonary:     Effort: Pulmonary effort is normal.     Breath sounds: Normal breath sounds.  Musculoskeletal:        General: Normal range of motion.     Cervical back: Normal range of motion.  Skin:    General: Skin is warm and dry.     Findings: No rash.  Neurological:     Mental Status: She is alert.   Psychiatric:        Mood and Affect: Mood normal.      UC Treatments / Results  Labs (all labs ordered are listed, but only abnormal results are displayed) Labs Reviewed  CULTURE, GROUP A STREP ([place])  NOVEL CORONAVIRUS, NAA  POCT RAPID STREP A (OFFICE)    EKG   Radiology No results found.  Procedures Procedures (including critical care time)  Medications Ordered in UC Medications - No data to display  Initial Impression / Assessment and Plan / UC Course  I have reviewed the triage vital signs and the nursing notes.  Pertinent labs & imaging results that were available during my care of the patient were reviewed by me and considered in my medical decision making (see chart for details).     Sore throat, nasal congestion-allergies are  most likely the cause of symptoms.  Continue the Flonase and add in zyrtec or Claritin.  Covid test pending but doubt.  Strep test negative.  Final Clinical Impressions(s) / UC Diagnoses   Final diagnoses:  Sore throat  Nasal congestion     Discharge Instructions     I believe this is allergies Continue the Flonase and add in zyrtec or  Claritin daily.  Follow up as needed for continued or worsening symptoms     ED Prescriptions    None     PDMP not reviewed this encounter.   Orvan July, NP 03/03/21 1119

## 2021-03-03 NOTE — ED Triage Notes (Signed)
Patient c/o nasal congestion and sore throat x 2 days.   Patient denies ear pain and headache.   Patient endorses painful swallowing.   Patient denies fever at home.   Patient used Flonase with no relief of symptoms.

## 2021-03-04 LAB — SARS-COV-2, NAA 2 DAY TAT

## 2021-03-04 LAB — NOVEL CORONAVIRUS, NAA: SARS-CoV-2, NAA: NOT DETECTED

## 2021-03-06 ENCOUNTER — Telehealth: Payer: Medicare Other | Admitting: Family Medicine

## 2021-03-06 LAB — CULTURE, GROUP A STREP (THRC)

## 2021-03-07 ENCOUNTER — Ambulatory Visit (INDEPENDENT_AMBULATORY_CARE_PROVIDER_SITE_OTHER): Payer: Medicare Other | Admitting: Psychology

## 2021-03-07 DIAGNOSIS — F3132 Bipolar disorder, current episode depressed, moderate: Secondary | ICD-10-CM

## 2021-03-20 ENCOUNTER — Other Ambulatory Visit: Payer: Self-pay

## 2021-03-20 ENCOUNTER — Encounter: Payer: Self-pay | Admitting: Family Medicine

## 2021-03-20 ENCOUNTER — Ambulatory Visit (INDEPENDENT_AMBULATORY_CARE_PROVIDER_SITE_OTHER): Payer: Medicare Other | Admitting: Family Medicine

## 2021-03-20 DIAGNOSIS — S20362A Insect bite (nonvenomous) of left front wall of thorax, initial encounter: Secondary | ICD-10-CM | POA: Diagnosis not present

## 2021-03-20 DIAGNOSIS — W57XXXA Bitten or stung by nonvenomous insect and other nonvenomous arthropods, initial encounter: Secondary | ICD-10-CM

## 2021-03-20 MED ORDER — DOXYCYCLINE HYCLATE 100 MG PO TABS
100.0000 mg | ORAL_TABLET | Freq: Two times a day (BID) | ORAL | 0 refills | Status: DC
Start: 2021-03-20 — End: 2021-05-22

## 2021-03-20 NOTE — Progress Notes (Signed)
This visit occurred during the SARS-CoV-2 public health emergency.  Safety protocols were in place, including screening questions prior to the visit, additional usage of staff PPE, and extensive cleaning of exam room while observing appropriate contact time as indicated for disinfecting solutions.  Tick bite.  Still partially attached.  Noted it Saturday.  Partially removed last night.  No FCNAVD.  Locally irritated.  She feels well w/o.    Meds, vitals, and allergies reviewed.   ROS: Per HPI unless specifically indicated in ROS section   nad ncat rrr ctab Tick on L side of chest wall, fully and easily removed with needle driver.  No spreading erythema.  Recheck with magnification.  Area was cleaned before and after with alcohol.  No complication.

## 2021-03-20 NOTE — Patient Instructions (Signed)
Keep it clean and covered.  If a fever or spreading redness, then start doxycycline and update [place].  Take care.  Glad to see you.

## 2021-03-22 DIAGNOSIS — W57XXXA Bitten or stung by nonvenomous insect and other nonvenomous arthropods, initial encounter: Secondary | ICD-10-CM | POA: Insufficient documentation

## 2021-03-22 NOTE — Assessment & Plan Note (Signed)
No signs of local cellulitis.  No systemic symptoms.  Routine cautions given to patient.  Her prescription for doxycycline right now.  She can start if she has any systemic symptoms or any sign of cellulitis locally.  Routine cautions given to patient she agrees with plan.

## 2021-03-29 ENCOUNTER — Ambulatory Visit: Payer: Medicare Other | Admitting: Dermatology

## 2021-03-29 ENCOUNTER — Other Ambulatory Visit: Payer: Self-pay

## 2021-03-29 DIAGNOSIS — L570 Actinic keratosis: Secondary | ICD-10-CM

## 2021-03-29 DIAGNOSIS — Z872 Personal history of diseases of the skin and subcutaneous tissue: Secondary | ICD-10-CM

## 2021-03-29 DIAGNOSIS — L820 Inflamed seborrheic keratosis: Secondary | ICD-10-CM

## 2021-03-29 DIAGNOSIS — L578 Other skin changes due to chronic exposure to nonionizing radiation: Secondary | ICD-10-CM

## 2021-03-29 DIAGNOSIS — L814 Other melanin hyperpigmentation: Secondary | ICD-10-CM | POA: Diagnosis not present

## 2021-03-29 DIAGNOSIS — D485 Neoplasm of uncertain behavior of skin: Secondary | ICD-10-CM

## 2021-03-29 HISTORY — DX: Actinic keratosis: L57.0

## 2021-03-29 NOTE — Patient Instructions (Signed)

## 2021-03-29 NOTE — Progress Notes (Signed)
Follow-Up Visit   Subjective  [name] [name] is a 65 y.o. female who presents for the following: Spots (Patient here to have several spots checked today. She has a red spot on her left forearm that has been there for several months and hasn't gone away. She has a pink, rough spot on her lip x 3-4 mos, scabs at times. She also has a spot on right hand dorsum where AK was treated 07/2020.).   The following portions of the chart were reviewed this encounter and updated as appropriate:       Review of Systems:  No other skin or systemic complaints except as noted in HPI or Assessment and Plan.  Objective  Well appearing patient in no apparent distress; mood and affect are within normal limits.  A focused examination was performed including arms, hands, face. Relevant physical exam findings are noted in the Assessment and Plan.  Objective  Left Distal Forearm x 1, base of L thumb x 1 (2): Erythematous keratotic or waxy stuck-on papule or plaque.   Objective  Right Central Upper Lip Vermilion Edge: 6.71mm pink scaly macule      Assessment & Plan   Actinic Damage - chronic, secondary to cumulative UV radiation exposure/sun exposure over time - diffuse scaly erythematous macules with underlying dyspigmentation - Recommend daily broad spectrum sunscreen SPF 30+ to sun-exposed areas, reapply every 2 hours as needed.  - Recommend staying in the shade or wearing long sleeves, sun glasses (UVA+UVB protection) and wide brim hats (4-inch brim around the entire circumference of the hat). - Call for new or changing lesions.  History of PreCancerous Actinic Keratosis  - site(s) of PreCancerous Actinic Keratosis clear today on the right hand dorsum - these may recur and new lesions may form requiring treatment to prevent transformation into skin cancer - observe for new or changing spots and contact [facility] for appointment if occur - photoprotection with sun protective clothing;  sunglasses and broad spectrum sunscreen with SPF of at least 30 + and frequent self skin exams recommended - yearly exams by a dermatologist recommended for persons with history of PreCancerous Actinic Keratoses  Inflamed seborrheic keratosis (2) Left Distal Forearm x 1, base of L thumb x 1  Prior to procedure, discussed risks of blister formation, small wound, skin dyspigmentation, or rare scar following cryotherapy.    Destruction of lesion - Left Distal Forearm x 1, base of L thumb x 1  Destruction method: cryotherapy   Informed consent: discussed and consent obtained   Lesion destroyed using liquid nitrogen: Yes   Region frozen until ice ball extended beyond lesion: Yes   Outcome: patient tolerated procedure well with no complications   Post-procedure details: wound care instructions given    Neoplasm of uncertain behavior of skin Right Central Upper Lip Vermilion Edge  Skin / nail biopsy Type of biopsy: tangential   Informed consent: discussed and consent obtained   Patient was prepped and draped in usual sterile fashion: Area prepped with alcohol. Anesthesia: the lesion was anesthetized in a standard fashion   Anesthetic:  1% lidocaine w/ epinephrine 1-100,000 buffered w/ 8.4% NaHCO3 Instrument used: flexible razor blade   Hemostasis achieved with: pressure, aluminum chloride and electrodesiccation   Outcome: patient tolerated procedure well   Post-procedure details: wound care instructions given   Post-procedure details comment:  Ointment and small bandage applied  Specimen 1 - Surgical pathology Differential Diagnosis: AK r/o SCCIS/BCC Check Margins: No 6.31mm pink scaly macule  Lentigines - Scattered tan macules, including base of left 3rd finger - Due to sun exposure - Benign-appering, observe - Recommend daily broad spectrum sunscreen SPF 30+ to sun-exposed areas, reapply every 2 hours as needed. - Call for any changes   Return as scheduled.   IJamesetta Orleans, CMA, am acting as scribe for Brendolyn Patty, MD .  Documentation: I have reviewed the above documentation for accuracy and completeness, and I agree with the above.  Brendolyn Patty MD

## 2021-04-03 ENCOUNTER — Telehealth: Payer: Self-pay

## 2021-04-03 NOTE — Telephone Encounter (Signed)
-----   Message from Brendolyn Patty, MD sent at 04/03/2021  8:31 AM EDT ----- Skin , right central upper lip vermilion edge, shave ACTINIC KERATOSIS  Precancer- recheck area in 2 months.  May need additional cryotherapy at that time if not clear

## 2021-04-03 NOTE — Telephone Encounter (Signed)
Left pt message to call for bx results/sh °

## 2021-04-04 ENCOUNTER — Ambulatory Visit (INDEPENDENT_AMBULATORY_CARE_PROVIDER_SITE_OTHER): Payer: Medicare Other | Admitting: Psychology

## 2021-04-04 ENCOUNTER — Telehealth: Payer: Self-pay

## 2021-04-04 DIAGNOSIS — F3132 Bipolar disorder, current episode depressed, moderate: Secondary | ICD-10-CM

## 2021-04-04 NOTE — Telephone Encounter (Signed)
Patient advised of BX results and 2 month follow up scheduled.

## 2021-04-04 NOTE — Telephone Encounter (Signed)
-----   Message from Tara Stewart, MD sent at 04/03/2021  8:31 AM EDT ----- Skin , right central upper lip vermilion edge, shave ACTINIC KERATOSIS  Precancer- recheck area in 2 months.  May need additional cryotherapy at that time if not clear 

## 2021-05-02 ENCOUNTER — Ambulatory Visit (INDEPENDENT_AMBULATORY_CARE_PROVIDER_SITE_OTHER): Payer: Medicare Other | Admitting: Psychology

## 2021-05-02 DIAGNOSIS — F3132 Bipolar disorder, current episode depressed, moderate: Secondary | ICD-10-CM

## 2021-05-16 ENCOUNTER — Telehealth: Payer: Self-pay | Admitting: Family Medicine

## 2021-05-16 DIAGNOSIS — E7800 Pure hypercholesterolemia, unspecified: Secondary | ICD-10-CM

## 2021-05-16 DIAGNOSIS — Z0000 Encounter for general adult medical examination without abnormal findings: Secondary | ICD-10-CM

## 2021-05-16 DIAGNOSIS — E039 Hypothyroidism, unspecified: Secondary | ICD-10-CM

## 2021-05-16 DIAGNOSIS — R7309 Other abnormal glucose: Secondary | ICD-10-CM

## 2021-05-16 DIAGNOSIS — R7989 Other specified abnormal findings of blood chemistry: Secondary | ICD-10-CM

## 2021-05-16 NOTE — Telephone Encounter (Signed)
-----   Message from Cloyd Stagers, RT sent at 05/01/2021 12:32 PM EDT ----- Regarding: Lab Orders for Wednesday 6.8.2022 Please place lab orders for Wednesday 6.8.2022, office visit for physical on Monday 6.13.2022 Thank you, Dyke Maes RT(R)

## 2021-05-17 ENCOUNTER — Other Ambulatory Visit (INDEPENDENT_AMBULATORY_CARE_PROVIDER_SITE_OTHER): Payer: Medicare Other

## 2021-05-17 ENCOUNTER — Other Ambulatory Visit: Payer: Self-pay

## 2021-05-17 ENCOUNTER — Ambulatory Visit (INDEPENDENT_AMBULATORY_CARE_PROVIDER_SITE_OTHER): Payer: Medicare Other

## 2021-05-17 DIAGNOSIS — Z0000 Encounter for general adult medical examination without abnormal findings: Secondary | ICD-10-CM

## 2021-05-17 DIAGNOSIS — E039 Hypothyroidism, unspecified: Secondary | ICD-10-CM | POA: Diagnosis not present

## 2021-05-17 DIAGNOSIS — R7309 Other abnormal glucose: Secondary | ICD-10-CM

## 2021-05-17 DIAGNOSIS — E7800 Pure hypercholesterolemia, unspecified: Secondary | ICD-10-CM | POA: Diagnosis not present

## 2021-05-17 DIAGNOSIS — R7989 Other specified abnormal findings of blood chemistry: Secondary | ICD-10-CM

## 2021-05-17 LAB — LIPID PANEL
Cholesterol: 189 mg/dL (ref 0–200)
HDL: 66.9 mg/dL (ref >39.00)
LDL Cholesterol: 95 mg/dL (ref 0–99)
NonHDL: 122.5
Total CHOL/HDL Ratio: 3
Triglycerides: 140 mg/dL (ref 0.0–149.0)
VLDL: 28 mg/dL (ref 0.0–40.0)

## 2021-05-17 LAB — CBC WITH DIFFERENTIAL/PLATELET
Basophils Absolute: 0 10*3/uL (ref 0.0–0.1)
Basophils Relative: 1 % (ref 0.0–3.0)
Eosinophils Absolute: 0.1 10*3/uL (ref 0.0–0.7)
Eosinophils Relative: 1.8 % (ref 0.0–5.0)
HCT: 40.5 % (ref 36.0–46.0)
Hemoglobin: 13.3 g/dL (ref 12.0–15.0)
Lymphocytes Relative: 40.3 % (ref 12.0–46.0)
Lymphs Abs: 1.9 10*3/uL (ref 0.7–4.0)
MCHC: 32.8 g/dL (ref 30.0–36.0)
MCV: 89.6 fl (ref 78.0–100.0)
Monocytes Absolute: 0.4 10*3/uL (ref 0.1–1.0)
Monocytes Relative: 8.3 % (ref 3.0–12.0)
Neutro Abs: 2.3 10*3/uL (ref 1.4–7.7)
Neutrophils Relative %: 48.6 % (ref 43.0–77.0)
Platelets: 223 10*3/uL (ref 150.0–400.0)
RBC: 4.52 Mil/uL (ref 3.87–5.11)
RDW: 14.4 % (ref 11.5–15.5)
WBC: 4.7 10*3/uL (ref 4.0–10.5)

## 2021-05-17 LAB — HEMOGLOBIN A1C: Hgb A1c MFr Bld: 6 % (ref 4.6–6.5)

## 2021-05-17 LAB — COMPREHENSIVE METABOLIC PANEL
ALT: 14 U/L (ref 0–35)
AST: 20 U/L (ref 0–37)
Albumin: 4.4 g/dL (ref 3.5–5.2)
Alkaline Phosphatase: 72 U/L (ref 39–117)
BUN: 17 mg/dL (ref 6–23)
CO2: 29 mEq/L (ref 19–32)
Calcium: 9.5 mg/dL (ref 8.4–10.5)
Chloride: 105 mEq/L (ref 96–112)
Creatinine, Ser: 1.2 mg/dL (ref 0.40–1.20)
GFR: 48.28 mL/min — ABNORMAL LOW (ref >60.00)
Glucose, Bld: 95 mg/dL (ref 70–99)
Potassium: 4.5 mEq/L (ref 3.5–5.1)
Sodium: 142 mEq/L (ref 135–145)
Total Bilirubin: 0.4 mg/dL (ref 0.2–1.2)
Total Protein: 6.9 g/dL (ref 6.0–8.3)

## 2021-05-17 LAB — TSH: TSH: 1.98 u[IU]/mL (ref 0.35–4.50)

## 2021-05-17 NOTE — Progress Notes (Signed)
Subjective:   [name] [name] is a 65 y.o. female who presents for Medicare Annual (Subsequent) preventive examination.  Review of Systems: N/A      I connected with the patient today by telephone and verified that I am speaking with the correct person using two identifiers. Location patient: home Location nurse: work Persons participating in the telephone visit: patient, nurse.   I discussed the limitations, risks, security and privacy concerns of performing an evaluation and management service by telephone and the availability of in person appointments. I also discussed with the patient that there may be a patient responsible charge related to this service. The patient expressed understanding and verbally consented to this telephonic visit.        Cardiac Risk Factors include: advanced age (>20men, >108 women);Other (see comment), Risk factor comments: hyperlipidemia     Objective:    Today's Vitals   There is no height or weight on file to calculate BMI.  Advanced Directives 05/17/2021 05/16/2020 05/08/2019 02/24/2018 02/20/2017 03/26/2016 05/20/2015  Does Patient Have a Medical Advance Directive? Yes Yes No No Yes No No  Type of Paramedic of [place];Living will [place];Living will - - [place];Living will - -  Copy of [place] in Chart? No - copy requested No - copy requested - - No - copy requested - -  Would patient like information on creating a medical advance directive? - - No - Patient declined No - Patient declined - No - patient declined information -    Current Medications (verified) Outpatient Encounter Medications as of 05/17/2021  Medication Sig  . ARIPiprazole (ABILIFY) 15 MG tablet Take 1 tablet by mouth every other day. (30 mg on the days she doesn't take 15 mg)  . ARIPiprazole (ABILIFY) 30 MG tablet Take 30 mg by mouth every other day. (15 mg on the days she doesn't take 30mg )  .  aspirin 81 MG tablet Take 81 mg by mouth daily.  Marland Kitchen BIOTIN PO Take 1 tablet by mouth daily.  Marland Kitchen buPROPion (WELLBUTRIN XL) 150 MG 24 hr tablet Take three tablets by mouth every morning  . cetirizine (ZYRTEC) 10 MG tablet Take 10 mg by mouth daily as needed for allergies.  . Cholecalciferol (VITAMIN D) 2000 UNITS CAPS Take 1 capsule by mouth daily.  . clonazePAM (KLONOPIN) 1 MG tablet Take 1 mg by mouth at bedtime as needed.  . Coenzyme Q10 (COQ10 PO) Take 100 mg by mouth daily.  Marland Kitchen doxycycline (VIBRA-TABS) 100 MG tablet Take 1 tablet (100 mg total) by mouth 2 (two) times daily.  . fluticasone (FLONASE) 50 MCG/ACT nasal spray Place 2 sprays into both nostrils daily as needed.  . lamoTRIgine (LAMICTAL) 100 MG tablet Take 100 mg by mouth daily.  . lansoprazole (PREVACID) 30 MG capsule Take 1 capsule (30 mg total) by mouth daily.  Marland Kitchen levothyroxine (SYNTHROID) 50 MCG tablet Take 1 tablet (50 mcg total) by mouth daily.  Marland Kitchen lovastatin (MEVACOR) 40 MG tablet Take 1 tablet (40 mg total) by mouth daily.  . Multiple Vitamin (MULTIVITAMIN) tablet Take 1 tablet by mouth daily.  . Probiotic Product (PROBIOTIC ADVANCED PO) Take by mouth.  Marland Kitchen tiZANidine (ZANAFLEX) 4 MG tablet Take 1 tablet by mouth at bedtime as needed.  . zolpidem (AMBIEN) 10 MG tablet Take 10 mg by mouth at bedtime.   No facility-administered encounter medications on file as of 05/17/2021.    Allergies (verified) Xylocaine [lidocaine hcl], Codeine,  Lithium, and Tegretol [carbamazepine]   History: Past Medical History:  Diagnosis Date  . Actinic keratosis 03/29/2021   R central upper lip  . Allergic rhinitis   . Basal cell carcinoma 10/16/2010   R lower pretibia   . Bipolar 1 disorder ([place])   . Fissure, anal   . GERD (gastroesophageal reflux disease)   . History of basal cell carcinoma excision    ON FACE  . History of squamous cell carcinoma excision    ON RIGHT LEG  . Hyperlipidemia   . Hypothyroidism   . IBS (irritable bowel  syndrome)   . Liver cyst   . Mallet finger of left hand    small finger   Past Surgical History:  Procedure Laterality Date  . CARDIAC CATHETERIZATION  04/1999   per pt normal  . CARDIOVASCULAR STRESS TEST  02-23-2011  dr Chrissie Noa [facility])   normal nuclear study/  no ischemia/  ef 64%  . CATARACT EXTRACTION W/ INTRAOCULAR LENS  IMPLANT, BILATERAL  2003   Bil  . CESAREAN SECTION  1984  . CLOSED REDUCTION FINGER WITH PERCUTANEOUS PINNING Left 04/22/2014   Procedure: LEFT SMALL FINGER CLOSED REDUCTION PINNING;  Surgeon: Linna Hoff, MD;  Location: [facility];  Service: Orthopedics;  Laterality: Left;  . COLONOSCOPY  11/2002  . LAPAROSCOPIC CHOLECYSTECTOMY  03-18-2000  . TRANSTHORACIC ECHOCARDIOGRAM  02-23-2011   normal lvf/  ef 58%/  mild rve/   mild lae/  mild  mr  & tr/  mild pulmonary htn  . WISDOM TOOTH EXTRACTION     Family History  Problem Relation Age of Onset  . Stroke Mother   . Heart disease Mother   . Hypertension Father   . Diabetes Father   . Hyperlipidemia Father   . Diabetes Brother    Social History   Socioeconomic History  . Marital status: Married    Spouse name: Not on file  . Number of children: 1  . Years of education: Not on file  . Highest education level: Not on file  Occupational History  . Occupation: Home    Employer: RETIRED  Tobacco Use  . Smoking status: Never Smoker  . Smokeless tobacco: Never Used  Vaping Use  . Vaping Use: Never used  Substance and Sexual Activity  . Alcohol use: Yes    Alcohol/week: 0.0 standard drinks    Comment: wine- rarely  . Drug use: No  . Sexual activity: Not Currently    Birth control/protection: Post-menopausal  Other Topics Concern  . Not on file  Social History Narrative   Moved here from [place]   Social Determinants of Health   Financial Resource Strain: Low Risk   . Difficulty of Paying Living Expenses: Not hard at all  Food Insecurity: No Food Insecurity  . Worried  About Charity fundraiser in the Last Year: Never true  . Ran Out of Food in the Last Year: Never true  Transportation Needs: No Transportation Needs  . Lack of Transportation (Medical): No  . Lack of Transportation (Non-Medical): No  Physical Activity: Sufficiently Active  . Days of Exercise per Week: 4 days  . Minutes of Exercise per Session: 60 min  Stress: No Stress Concern Present  . Feeling of Stress : Not at all  Social Connections: Not on file    Tobacco Counseling Counseling given: Not Answered   Clinical Intake:  Pre-visit preparation completed: Yes  Pain : No/denies pain     Nutritional  Risks: None Diabetes: No  How often do you need to have someone help you when you read instructions, pamphlets, or other written materials from your doctor or pharmacy?: 1 - Never What is the last grade level you completed in school?: bachelors  Diabetic: No Nutrition Risk Assessment:  Has the patient had any N/V/D within the last 2 months?  No  Does the patient have any non-healing wounds?  No  Has the patient had any unintentional weight loss or weight gain?  No   Diabetes:  Is the patient diabetic?  No  If diabetic, was a CBG obtained today?  N/A Did the patient bring in their glucometer from home?  N/A How often do you monitor your CBG's? N/A.   Financial Strains and Diabetes Management:  Are you having any financial strains with the device, your supplies or your medication? N/A.  Does the patient want to be seen by Chronic Care Management for management of their diabetes?  N/A Would the patient like to be referred to a Nutritionist or for Diabetic Management?  N/A  Interpreter Needed?: No  Information entered by :: CJohnson, LPN   Activities of Daily Living In your present state of health, do you have any difficulty performing the following activities: 05/17/2021  Hearing? Y  Comment wears hearing aids  Vision? N  Difficulty concentrating or making decisions?  N  Walking or climbing stairs? N  Dressing or bathing? N  Doing errands, shopping? N  Preparing Food and eating ? N  Using the Toilet? N  In the past six months, have you accidently leaked urine? N  Do you have problems with loss of bowel control? N  Managing your Medications? N  Managing your Finances? N  Housekeeping or managing your Housekeeping? N  Some recent data might be hidden    Patient Care Team: Tower, Wynelle Fanny, MD as PCP - [place], OD as Consulting Physician (Optometry)  Indicate any recent Medical Services you may have received from other than [facility] providers in the past year (date may be approximate).     Assessment:   This is a routine wellness examination for [place].  Hearing/Vision screen  Hearing Screening   125Hz  250Hz  500Hz  1000Hz  2000Hz  3000Hz  4000Hz  6000Hz  8000Hz   Right ear:           Left ear:           Vision Screening Comments: Patient gets annual eye exams   Dietary issues and exercise activities discussed: Current Exercise Habits: Structured exercise class, Type of exercise: strength training/weights, Time (Minutes): 60, Frequency (Times/Week): 4, Weekly Exercise (Minutes/Week): 240, Intensity: Moderate, Exercise limited by: None identified  Goals Addressed            This Visit's Progress   . Patient Stated       05/17/2021, I will continue to do cardio and weight training for 45 minutes 4 days a week.       Depression Screen PHQ 2/9 Scores 05/17/2021 08/16/2020 05/16/2020 05/08/2019 05/08/2019 02/24/2018 02/20/2017  PHQ - 2 Score 4 0 6 3 0 2 2  PHQ- 9 Score 4 - 6 7 - 8 5    Fall Risk Fall Risk  05/17/2021 08/16/2020 05/16/2020 05/08/2019 02/24/2018  Falls in the past year? 0 0 0 1 Yes  Comment - - - fell while walking up steps on bus fell after slipping in mud; brusing to elbow and knee  Number falls in past yr: 0 0 0 0 1  Injury with Fall? 0 - 0 1 Yes  Comment - - - - -  Risk for fall due to : Medication side effect - Medication side effect  - -  Follow up Falls evaluation completed;Falls prevention discussed Falls evaluation completed Falls evaluation completed;Falls prevention discussed - -    FALL RISK PREVENTION PERTAINING TO THE HOME:  Any stairs in or around the home? Yes  If so, are there any without handrails? No  Home free of loose throw rugs in walkways, pet beds, electrical cords, etc? Yes  Adequate lighting in your home to reduce risk of falls? Yes   ASSISTIVE DEVICES UTILIZED TO PREVENT FALLS:  Life alert? No  Use of a cane, walker or w/c? No  Grab bars in the bathroom? No  Shower chair or bench in shower? No  Elevated toilet seat or a handicapped toilet? No   TIMED UP AND GO:  Was the test performed? N/A telephone visit .    Cognitive Function: MMSE - Mini Mental State Exam 05/17/2021 05/16/2020 05/08/2019 02/24/2018 02/20/2017  Orientation to time 5 5 5 5 5   Orientation to Place 5 5 5 5 5   Registration 3 3 3 3 3   Attention/ Calculation 5 5 0 0 0  Recall 3 3 3 3 2   Recall-comments - - - - pt was unable to recall 1 of 3 words  Language- name 2 objects - - 0 0 0  Language- repeat 1 1 1 1 1   Language- follow 3 step command - - 0 3 3  Language- read & follow direction - - 0 0 0  Write a sentence - - 0 0 0  Copy design - - 0 0 0  Total score - - 17 20 19   Mini Cog  Mini-Cog screen was completed. Maximum score is 22. A value of 0 denotes this part of the MMSE was not completed or the patient failed this part of the Mini-Cog screening.       Immunizations Immunization History  Administered Date(s) Administered  . Influenza Split 09/16/2012  . Influenza Whole 10/01/2006, 09/14/2008, 09/07/2009, 09/04/2010, 08/21/2011  . Influenza, Seasonal, Injecte, Preservative Fre 09/13/2016  . Influenza,inj,Quad PF,6+ Mos 08/26/2017, 08/25/2018, 08/07/2019, 08/05/2020  . Influenza-Unspecified 09/08/2013, 08/10/2014, 08/25/2015  . PFIZER(Purple Top)SARS-COV-2 Vaccination 02/27/2020, 03/23/2020  . Pneumococcal  Polysaccharide-23 08/15/2006, 10/16/2013  . Td 12/10/1998, 09/30/2009  . Tdap 08/29/2017  . Zoster, Live 04/13/2011    TDAP status: Up to date  Flu Vaccine status: Up to date  Pneumococcal vaccine status: Up to date  Covid-19 vaccine status: 2 doses completed, booster declined   Qualifies for Shingles Vaccine? Yes   Zostavax completed Yes   Shingrix Completed?: No.    Education has been provided regarding the importance of this vaccine. Patient has been advised to call insurance company to determine out of pocket expense if they have not yet received this vaccine. Advised may also receive vaccine at local pharmacy or Health Dept. Verbalized acceptance and understanding.  Screening Tests Health Maintenance  Topic Date Due  . Pneumococcal Vaccine 17-89 Years old (1 of 4 - PCV13) Never done  . Zoster Vaccines- Shingrix (1 of 2) Never done  . COVID-19 Vaccine (3 - Pfizer risk 4-dose series) 04/20/2020  . HIV Screening  01/12/2024 (Originally 03/09/1971)  . INFLUENZA VACCINE  07/10/2021  . MAMMOGRAM  08/16/2021  . PAP SMEAR-Modifier  05/13/2022  . COLONOSCOPY (Pts 45-15yrs Insurance coverage will need to be confirmed)  05/19/2025  . TETANUS/TDAP  08/30/2027  .  Hepatitis C Screening  Completed  . HPV VACCINES  Aged Out    Health Maintenance  Health Maintenance Due  Topic Date Due  . Pneumococcal Vaccine 26-42 Years old (1 of 4 - PCV13) Never done  . Zoster Vaccines- Shingrix (1 of 2) Never done  . COVID-19 Vaccine (3 - Pfizer risk 4-dose series) 04/20/2020    Colorectal cancer screening: Type of screening: Colonoscopy. Completed 05/20/2015. Repeat every 10 years  Mammogram status: Completed 08/16/2020. Repeat every year  Bone Density status: due at age 3  Lung Cancer Screening: (Low Dose CT Chest recommended if Age 24-80 years, 30 pack-year currently smoking OR have quit w/in 15years.) does not qualify.   Additional Screening:  Hepatitis C Screening: does qualify; Completed  12/28/2016  Vision Screening: Recommended annual ophthalmology exams for early detection of glaucoma and other disorders of the eye. Is the patient up to date with their annual eye exam?  Yes  Who is the provider or what is the name of the office in which the patient attends annual eye exams? Dr. Chong Sicilian  If pt is not established with a provider, would they like to be referred to a provider to establish care? No .   Dental Screening: Recommended annual dental exams for proper oral hygiene  Community Resource Referral / Chronic Care Management: CRR required this visit?  No   CCM required this visit?  No      Plan:     I have personally reviewed and noted the following in the patient's chart:   . Medical and social history . Use of alcohol, tobacco or illicit drugs  . Current medications and supplements including opioid prescriptions.  . Functional ability and status . Nutritional status . Physical activity . Advanced directives . List of other physicians . Hospitalizations, surgeries, and ER visits in previous 12 months . Vitals . Screenings to include cognitive, depression, and falls . Referrals and appointments  In addition, I have reviewed and discussed with patient certain preventive protocols, quality metrics, and best practice recommendations. A written personalized care plan for preventive services as well as general preventive health recommendations were provided to patient.   Due to this being a telephonic visit, the after visit summary with patients personalized plan was offered to patient via office or my-chart. Patient preferred to pick up at office at next visit or via mychart.   Andrez Grime, LPN   [id number]

## 2021-05-17 NOTE — Progress Notes (Signed)
PCP notes:  Health Maintenance: Covid booster- declined shingrix- due   Abnormal Screenings: none   Patient concerns: Wants lab results sent to Dr. Toy Care    Nurse concerns: none   Next PCP appt.: 05/22/2021 @ 10 am

## 2021-05-17 NOTE — Patient Instructions (Signed)
[name] [name] , Thank you for taking time to come for your Medicare Wellness Visit. I appreciate your ongoing commitment to your health goals. Please review the following plan we discussed and let me know if I can assist you in the future.   Screening recommendations/referrals: Colonoscopy: Up to date, completed 05/20/2015, due 05/2025 Mammogram: Up to date, completed 08/16/2020, due 08/2021 Bone Density: due at age 65 Recommended yearly ophthalmology/optometry visit for glaucoma screening and checkup Recommended yearly dental visit for hygiene and checkup  Vaccinations: Influenza vaccine: Up to date, completed 08/05/2020, due 07/2021 Pneumococcal vaccine: Up to date, completed 10/16/2013, due at age 85  Tdap vaccine: Up to date, completed 08/29/2017, due 08/2027 Shingles vaccine: due, check with your insurance regarding coverage if interested   Covid-19: 2 doses completed, booster declined   Advanced directives: Please bring a copy of your POA (Power of [place]) and/or Living Will to your next appointment.   Conditions/risks identified: hyperlipidemia   Next appointment: Follow up in one year for your annual wellness visit.   Preventive Care 40-64 Years, Female Preventive care refers to lifestyle choices and visits with your health care provider that can promote health and wellness. What does preventive care include?  A yearly physical exam. This is also called an annual well check.  Dental exams once or twice a year.  Routine eye exams. Ask your health care provider how often you should have your eyes checked.  Personal lifestyle choices, including:  Daily care of your teeth and gums.  Regular physical activity.  Eating a healthy diet.  Avoiding tobacco and drug use.  Limiting alcohol use.  Practicing safe sex.  Taking low-dose aspirin daily starting at age 69.  Taking vitamin and mineral supplements as recommended by your health care provider. What happens during an annual  well check? The services and screenings done by your health care provider during your annual well check will depend on your age, overall health, lifestyle risk factors, and family history of disease. Counseling  Your health care provider may ask you questions about your:  Alcohol use.  Tobacco use.  Drug use.  Emotional well-being.  Home and relationship well-being.  Sexual activity.  Eating habits.  Work and work Statistician.  Method of birth control.  Menstrual cycle.  Pregnancy history. Screening  You may have the following tests or measurements:  Height, weight, and BMI.  Blood pressure.  Lipid and cholesterol levels. These may be checked every 5 years, or more frequently if you are over 67 years old.  Skin check.  Lung cancer screening. You may have this screening every year starting at age 26 if you have a 30-pack-year history of smoking and currently smoke or have quit within the past 15 years.  Fecal occult blood test (FOBT) of the stool. You may have this test every year starting at age 59.  Flexible sigmoidoscopy or colonoscopy. You may have a sigmoidoscopy every 5 years or a colonoscopy every 10 years starting at age 52.  Hepatitis C blood test.  Hepatitis B blood test.  Sexually transmitted disease (STD) testing.  Diabetes screening. This is done by checking your blood sugar (glucose) after you have not eaten for a while (fasting). You may have this done every 1-3 years.  Mammogram. This may be done every 1-2 years. Talk to your health care provider about when you should start having regular mammograms. This may depend on whether you have a family history of breast cancer.  BRCA-related cancer screening. This may  be done if you have a family history of breast, ovarian, tubal, or peritoneal cancers.  Pelvic exam and Pap test. This may be done every 3 years starting at age 44. Starting at age 72, this may be done every 5 years if you have a Pap test in  combination with an HPV test.  Bone density scan. This is done to screen for osteoporosis. You may have this scan if you are at high risk for osteoporosis. Discuss your test results, treatment options, and if necessary, the need for more tests with your health care provider. Vaccines  Your health care provider may recommend certain vaccines, such as:  Influenza vaccine. This is recommended every year.  Tetanus, diphtheria, and acellular pertussis (Tdap, Td) vaccine. You may need a Td booster every 10 years.  Zoster vaccine. You may need this after age 30.  Pneumococcal 13-valent conjugate (PCV13) vaccine. You may need this if you have certain conditions and were not previously vaccinated.  Pneumococcal polysaccharide (PPSV23) vaccine. You may need one or two doses if you smoke cigarettes or if you have certain conditions. Talk to your health care provider about which screenings and vaccines you need and how often you need them. This information is not intended to replace advice given to you by your health care provider. Make sure you discuss any questions you have with your health care provider. Document Released: 12/23/2015 Document Revised: 08/15/2016 Document Reviewed: 09/27/2015 Elsevier Interactive Patient Education  2017 [place] Prevention in the Home Falls can cause injuries. They can happen to people of all ages. There are many things you can do to make your home safe and to help prevent falls. What can I do on the outside of my home?  Regularly fix the edges of walkways and driveways and fix any cracks.  Remove anything that might make you trip as you walk through a door, such as a raised step or threshold.  Trim any bushes or trees on the path to your home.  Use bright outdoor lighting.  Clear any walking paths of anything that might make someone trip, such as rocks or tools.  Regularly check to see if handrails are loose or broken. Make sure that both  sides of any steps have handrails.  Any raised decks and porches should have guardrails on the edges.  Have any leaves, snow, or ice cleared regularly.  Use sand or salt on walking paths during winter.  Clean up any spills in your garage right away. This includes oil or grease spills. What can I do in the bathroom?  Use night lights.  Install grab bars by the toilet and in the tub and shower. Do not use towel bars as grab bars.  Use non-skid mats or decals in the tub or shower.  If you need to sit down in the shower, use a plastic, non-slip stool.  Keep the floor dry. Clean up any water that spills on the floor as soon as it happens.  Remove soap buildup in the tub or shower regularly.  Attach bath mats securely with double-sided non-slip rug tape.  Do not have throw rugs and other things on the floor that can make you trip. What can I do in the bedroom?  Use night lights.  Make sure that you have a light by your bed that is easy to reach.  Do not use any sheets or blankets that are too big for your bed. They should not hang down onto  the floor.  Have a firm chair that has side arms. You can use this for support while you get dressed.  Do not have throw rugs and other things on the floor that can make you trip. What can I do in the kitchen?  Clean up any spills right away.  Avoid walking on wet floors.  Keep items that you use a lot in easy-to-reach places.  If you need to reach something above you, use a strong step stool that has a grab bar.  Keep electrical cords out of the way.  Do not use floor polish or wax that makes floors slippery. If you must use wax, use non-skid floor wax.  Do not have throw rugs and other things on the floor that can make you trip. What can I do with my stairs?  Do not leave any items on the stairs.  Make sure that there are handrails on both sides of the stairs and use them. Fix handrails that are broken or loose. Make sure that  handrails are as long as the stairways.  Check any carpeting to make sure that it is firmly attached to the stairs. Fix any carpet that is loose or worn.  Avoid having throw rugs at the top or bottom of the stairs. If you do have throw rugs, attach them to the floor with carpet tape.  Make sure that you have a light switch at the top of the stairs and the bottom of the stairs. If you do not have them, ask someone to add them for you. What else can I do to help prevent falls?  Wear shoes that:  Do not have high heels.  Have rubber bottoms.  Are comfortable and fit you well.  Are closed at the toe. Do not wear sandals.  If you use a stepladder:  Make sure that it is fully opened. Do not climb a closed stepladder.  Make sure that both sides of the stepladder are locked into place.  Ask someone to hold it for you, if possible.  Clearly mark and make sure that you can see:  Any grab bars or handrails.  First and last steps.  Where the edge of each step is.  Use tools that help you move around (mobility aids) if they are needed. These include:  Canes.  Walkers.  Scooters.  Crutches.  Turn on the lights when you go into a dark area. Replace any light bulbs as soon as they burn out.  Set up your furniture so you have a clear path. Avoid moving your furniture around.  If any of your floors are uneven, fix them.  If there are any pets around you, be aware of where they are.  Review your medicines with your doctor. Some medicines can make you feel dizzy. This can increase your chance of falling. Ask your doctor what other things that you can do to help prevent falls. This information is not intended to replace advice given to you by your health care provider. Make sure you discuss any questions you have with your health care provider. Document Released: 09/22/2009 Document Revised: 05/03/2016 Document Reviewed: 12/31/2014 Elsevier Interactive Patient Education  2017  [employer].

## 2021-05-22 ENCOUNTER — Other Ambulatory Visit: Payer: Self-pay

## 2021-05-22 ENCOUNTER — Encounter: Payer: Self-pay | Admitting: Family Medicine

## 2021-05-22 ENCOUNTER — Ambulatory Visit (INDEPENDENT_AMBULATORY_CARE_PROVIDER_SITE_OTHER): Payer: Medicare Other | Admitting: Family Medicine

## 2021-05-22 VITALS — BP 124/68 | HR 65 | Temp 97.5°F | Ht 60.25 in | Wt 216.2 lb

## 2021-05-22 DIAGNOSIS — E7800 Pure hypercholesterolemia, unspecified: Secondary | ICD-10-CM

## 2021-05-22 DIAGNOSIS — S8012XA Contusion of left lower leg, initial encounter: Secondary | ICD-10-CM

## 2021-05-22 DIAGNOSIS — R7989 Other specified abnormal findings of blood chemistry: Secondary | ICD-10-CM

## 2021-05-22 DIAGNOSIS — N951 Menopausal and female climacteric states: Secondary | ICD-10-CM | POA: Insufficient documentation

## 2021-05-22 DIAGNOSIS — R7309 Other abnormal glucose: Secondary | ICD-10-CM | POA: Diagnosis not present

## 2021-05-22 DIAGNOSIS — E039 Hypothyroidism, unspecified: Secondary | ICD-10-CM

## 2021-05-22 DIAGNOSIS — Z0000 Encounter for general adult medical examination without abnormal findings: Secondary | ICD-10-CM

## 2021-05-22 DIAGNOSIS — F3177 Bipolar disorder, in partial remission, most recent episode mixed: Secondary | ICD-10-CM

## 2021-05-22 DIAGNOSIS — K219 Gastro-esophageal reflux disease without esophagitis: Secondary | ICD-10-CM

## 2021-05-22 DIAGNOSIS — Z23 Encounter for immunization: Secondary | ICD-10-CM | POA: Diagnosis not present

## 2021-05-22 MED ORDER — FLUTICASONE PROPIONATE 50 MCG/ACT NA SUSP: 2.0000 | Freq: Every day | NASAL | 3 refills | Status: DC | PRN

## 2021-05-22 MED ORDER — LOVASTATIN 40 MG PO TABS
40.0000 mg | ORAL_TABLET | Freq: Every day | ORAL | 3 refills | Status: DC
Start: 2021-05-22 — End: 2022-02-26

## 2021-05-22 MED ORDER — ESTRADIOL 0.1 MG/GM VA CREA: TOPICAL_CREAM | VAGINAL | 2 refills | Status: DC

## 2021-05-22 MED ORDER — LANSOPRAZOLE 30 MG PO CPDR: 30.0000 mg | DELAYED_RELEASE_CAPSULE | Freq: Every day | ORAL | 3 refills | Status: DC

## 2021-05-22 MED ORDER — LEVOTHYROXINE SODIUM 50 MCG PO TABS: 50.0000 ug | ORAL_TABLET | Freq: Every day | ORAL | 3 refills | Status: DC

## 2021-05-22 NOTE — Assessment & Plan Note (Signed)
This appears to have scarred-hyperpig area

## 2021-05-22 NOTE — Assessment & Plan Note (Signed)
Lab Results  Component Value Date   HGBA1C 6.0 05/17/2021   disc imp of low glycemic diet and wt loss to prevent DM2

## 2021-05-22 NOTE — Assessment & Plan Note (Signed)
Stable, under care of psychiatry  Stays motivated and positive

## 2021-05-22 NOTE — Progress Notes (Signed)
Subjective:    Patient ID: [name] [name], female    DOB: 10-12-1956, 65 y.o.   MRN: [id number]  This visit occurred during the SARS-CoV-2 public health emergency.  Safety protocols were in place, including screening questions prior to the visit, additional usage of staff PPE, and extensive cleaning of exam room while observing appropriate contact time as indicated for disinfecting solutions.   HPI Here for health maintenance exam and to review chronic medical problems    Pt had amw on 6/8   Wt Readings from Last 3 Encounters:  05/22/21 216 lb 4 oz (98.1 kg)  03/20/21 218 lb (98.9 kg)  01/03/21 218 lb (98.9 kg)   41.88 kg/m  Working out 4 d per week- bike and classes Feels good doing that  Eats healthy some of the time  Mammogram 9/21 Self exam-no new lumps   Colonoscopy 6/16   Zostavax 5/12  Shingrix -interested , would like today/is covered  Had the 2 covid vaccines - not the booster yet    BP Readings from Last 3 Encounters:  05/22/21 124/68  03/20/21 122/78  03/03/21 133/78   Pulse Readings from Last 3 Encounters:  05/22/21 65  03/20/21 68  03/03/21 66    Hypothyroidism  Pt has no clinical changes No change in energy level/ hair or skin/ edema and no tremor Lab Results  Component Value Date   TSH 1.98 05/17/2021    Takes levothyroxine 50 mcg daily    Lab Results  Component Value Date   CREATININE 1.20 05/17/2021   BUN 17 05/17/2021   NA 142 05/17/2021   K 4.5 05/17/2021   CL 105 05/17/2021   CO2 29 05/17/2021  GFR 48.2 Drinks fair amt of water    H/o elevated glucose Lab Results  Component Value Date   HGBA1C 6.0 05/17/2021   Last 5.7  Diet not optimal   Prevacid for GERD    Hyperlipidemia Lab Results  Component Value Date   CHOL 189 05/17/2021   CHOL 182 05/12/2020   CHOL 184 05/08/2019   Lab Results  Component Value Date   HDL 66.90 05/17/2021   HDL 66.90 05/12/2020   HDL 66.20 05/08/2019   Lab Results  Component  Value Date   LDLCALC 95 05/17/2021   LDLCALC 95 05/12/2020   LDLCALC 88 05/08/2019   Lab Results  Component Value Date   TRIG 140.0 05/17/2021   TRIG 103.0 05/12/2020   TRIG 148.0 05/08/2019   Lab Results  Component Value Date   CHOLHDL 3 05/17/2021   CHOLHDL 3 05/12/2020   CHOLHDL 3 05/08/2019   No results found for: LDLDIRECT  Lovastatin 40 mg daily  Well controlled   Lab Results  Component Value Date   WBC 4.7 05/17/2021   HGB 13.3 05/17/2021   HCT 40.5 05/17/2021   MCV 89.6 05/17/2021   PLT 223.0 05/17/2021   Lab Results  Component Value Date   ALT 14 05/17/2021   AST 20 05/17/2021   ALKPHOS 72 05/17/2021   BILITOT 0.4 05/17/2021    Some discomfort with intercourse  Tried replens and [place] - they do not work    Patient Active Problem List   Diagnosis Date Noted   Vaginal dryness, menopausal 05/22/2021   Arthropod bite 03/22/2021   Plantar wart, left foot 08/02/2020   Bipolar disorder, in partial remission, most recent episode mixed ([place]) 02/25/2020   Epigastric pain 09/24/2019   Chronic diarrhea 09/24/2019   Contusion of left lower leg 01/05/2019  Breast lump on left side at 5 o'clock position 08/25/2018   Elevated glucose level 03/03/2018   Trochanteric bursitis 01/07/2018   Atrophic vaginitis 07/08/2017   TMJ (temporomandibular joint disorder) 07/08/2017   Cervical stenosis (uterine cervix) 01/03/2017   Hearing loss 10/25/2015   Routine general medical examination at a health care facility 10/17/2015   Encounter for routine gynecological examination 10/22/2014   Elevated serum creatinine 10/22/2014   Colon cancer screening 10/16/2013   ETD (eustachian tube dysfunction) 04/11/2012   Encounter for Medicare annual wellness exam 10/04/2011   METATARSALGIA 12/01/2009   POSTMENOPAUSAL STATUS 09/30/2009   ARTHRITIS, RIGHT HIP 03/10/2009   DEGENERATIVE [place] DISEASE, LUMBAR SPINE 03/10/2009   ONYCHOMYCOSIS 09/17/2008   Hypothyroidism 08/05/2007   Morbid  obesity ([place]) 08/05/2007   Bipolar disorder ([place]) 08/05/2007   ALLERGIC RHINITIS 08/05/2007   GERD 08/05/2007   IBS 08/05/2007   HYPERCHOLESTEROLEMIA, PURE 07/07/2007   Past Medical History:  Diagnosis Date   Actinic keratosis 03/29/2021   R central upper lip   Allergic rhinitis    Basal cell carcinoma 10/16/2010   R lower pretibia    Bipolar 1 disorder (HCC)    Fissure, anal    GERD (gastroesophageal reflux disease)    History of basal cell carcinoma excision    ON FACE   History of squamous cell carcinoma excision    ON RIGHT LEG   Hyperlipidemia    Hypothyroidism    IBS (irritable bowel syndrome)    Liver cyst    Mallet finger of left hand    small finger   Past Surgical History:  Procedure Laterality Date   CARDIAC CATHETERIZATION  04/1999   per pt normal   CARDIOVASCULAR STRESS TEST  02-23-2011  dr Chrissie Noa [facility])   normal nuclear study/  no ischemia/  ef 64%   CATARACT EXTRACTION W/ INTRAOCULAR LENS  IMPLANT, BILATERAL  2003   Bil   CESAREAN SECTION  1984   CLOSED REDUCTION FINGER WITH PERCUTANEOUS PINNING Left 04/22/2014   Procedure: LEFT SMALL FINGER CLOSED REDUCTION PINNING;  Surgeon: Linna Hoff, MD;  Location: [facility];  Service: Orthopedics;  Laterality: Left;   COLONOSCOPY  11/2002   LAPAROSCOPIC CHOLECYSTECTOMY  03-18-2000   TRANSTHORACIC ECHOCARDIOGRAM  02-23-2011   normal lvf/  ef 58%/  mild rve/   mild lae/  mild  mr  & tr/  mild pulmonary htn   WISDOM TOOTH EXTRACTION     Social History   Tobacco Use   Smoking status: Never   Smokeless tobacco: Never  Vaping Use   Vaping Use: Never used  Substance Use Topics   Alcohol use: Yes    Alcohol/week: 0.0 standard drinks    Comment: wine- rarely   Drug use: No   Family History  Problem Relation Age of Onset   Stroke Mother    Heart disease Mother    Hypertension Father    Diabetes Father    Hyperlipidemia Father    Diabetes Brother    Allergies  Allergen  Reactions   Xylocaine [Lidocaine Hcl] Swelling    ALLERGIC TO ALL CAINES EXCEPT SENSORCAINE , AND MARCAINE   Codeine Nausea And Vomiting    REACTION: vomits blood   Lithium Swelling   Tegretol [Carbamazepine] Other (See Comments)    REACTION: skin crawls   Current Outpatient Medications on File Prior to Visit  Medication Sig Dispense Refill   ARIPiprazole (ABILIFY) 15 MG tablet Take 1 tablet by mouth every other day. (30  mg on the days she doesn't take 15 mg)     ARIPiprazole (ABILIFY) 30 MG tablet Take 30 mg by mouth every other day. (15 mg on the days she doesn't take 30mg )     aspirin 81 MG tablet Take 81 mg by mouth daily.     BIOTIN PO Take 1 tablet by mouth daily.     buPROPion (WELLBUTRIN XL) 150 MG 24 hr tablet Take three tablets by mouth every morning     cetirizine (ZYRTEC) 10 MG tablet Take 10 mg by mouth daily as needed for allergies.     Cholecalciferol (VITAMIN D) 2000 UNITS CAPS Take 1 capsule by mouth daily.     clonazePAM (KLONOPIN) 1 MG tablet Take 1 mg by mouth at bedtime as needed.     Coenzyme Q10 (COQ10 PO) Take 100 mg by mouth daily.     lamoTRIgine (LAMICTAL) 100 MG tablet Take 100 mg by mouth daily.     Multiple Vitamin (MULTIVITAMIN) tablet Take 1 tablet by mouth daily.     Probiotic Product (PROBIOTIC ADVANCED PO) Take by mouth.     tiZANidine (ZANAFLEX) 4 MG tablet Take 1 tablet by mouth at bedtime as needed.  0   zolpidem (AMBIEN) 10 MG tablet Take 10 mg by mouth at bedtime.     No current facility-administered medications on file prior to visit.    Review of Systems  Constitutional:  Negative for activity change, appetite change, fatigue, fever and unexpected weight change.  HENT:  Negative for congestion, ear pain, rhinorrhea, sinus pressure and sore throat.   Eyes:  Negative for pain, redness and visual disturbance.  Respiratory:  Negative for cough, shortness of breath and wheezing.   Cardiovascular:  Negative for chest pain and palpitations.   Gastrointestinal:  Negative for abdominal pain, blood in stool, constipation and diarrhea.  Endocrine: Negative for polydipsia and polyuria.  Genitourinary:  Negative for dysuria, frequency and urgency.       Vaginal dryness- pain with intercourse  Musculoskeletal:  Negative for arthralgias, back pain and myalgias.  Skin:  Negative for pallor and rash.  Allergic/Immunologic: Negative for environmental allergies.  Neurological:  Negative for dizziness, syncope and headaches.  Hematological:  Negative for adenopathy. Does not bruise/bleed easily.  Psychiatric/Behavioral:  Negative for decreased concentration and dysphoric mood. The patient is not nervous/anxious.       Objective:   Physical Exam Constitutional:      General: She is not in acute distress.    Appearance: Normal appearance. She is well-developed. She is obese. She is not ill-appearing or diaphoretic.  HENT:     Head: Normocephalic and atraumatic.     Right Ear: Tympanic membrane, ear canal and external ear normal.     Left Ear: Tympanic membrane, ear canal and external ear normal.     Nose: Nose normal. No congestion.     Mouth/Throat:     Mouth: Mucous membranes are moist.     Pharynx: Oropharynx is clear. No posterior oropharyngeal erythema.  Eyes:     General: No scleral icterus.    Extraocular Movements: Extraocular movements intact.     Conjunctiva/sclera: Conjunctivae normal.     Pupils: Pupils are equal, round, and reactive to light.  Neck:     Thyroid: No thyromegaly.     Vascular: No carotid bruit or JVD.  Cardiovascular:     Rate and Rhythm: Normal rate and regular rhythm.     Pulses: Normal pulses.     Heart sounds:  Normal heart sounds.    No gallop.  Pulmonary:     Effort: Pulmonary effort is normal. No respiratory distress.     Breath sounds: Normal breath sounds. No wheezing.     Comments: Good air exch Chest:     Chest wall: No tenderness.  Abdominal:     General: Bowel sounds are normal.  There is no distension or abdominal bruit.     Palpations: Abdomen is soft. There is no mass.     Tenderness: There is no abdominal tenderness.     Hernia: No hernia is present.  Genitourinary:    Comments: Breast exam: No mass, nodules, thickening, tenderness, bulging, retraction, inflamation, nipple discharge or skin changes noted.  No axillary or clavicular LA.     Musculoskeletal:        General: No tenderness. Normal range of motion.     Cervical back: Normal range of motion and neck supple. No rigidity. No muscular tenderness.     Right lower leg: No edema.     Left lower leg: No edema.  Lymphadenopathy:     Cervical: No cervical adenopathy.  Skin:    General: Skin is warm and dry.     Coloration: Skin is not pale.     Findings: No erythema or rash.     Comments: Solar lentigines diffusely   Neurological:     Mental Status: She is alert. Mental status is at baseline.     Cranial Nerves: No cranial nerve deficit.     Motor: No abnormal muscle tone.     Coordination: Coordination normal.     Gait: Gait normal.     Deep Tendon Reflexes: Reflexes are normal and symmetric. Reflexes normal.  Psychiatric:        Mood and Affect: Mood normal.        Cognition and Memory: Cognition and memory normal.          Assessment & Plan:   Problem List Items Addressed This Visit       Digestive   GERD    I would like her to wean prevacid in the future if able in the interest of kidney health Will try taking it every other day to start  Disc acid rebound possible  Enc to watch diet        Relevant Medications   lansoprazole (PREVACID) 30 MG capsule     Endocrine   Hypothyroidism    Hypothyroidism  Pt has no clinical changes No change in energy level/ hair or skin/ edema and no tremor Lab Results  Component Value Date   TSH 1.98 05/17/2021    Will plan to continue levothyroxine 50 mcg daily        Relevant Medications   levothyroxine (SYNTHROID) 50 MCG tablet      Genitourinary   Vaginal dryness, menopausal    Px estrace cream to use twice weekly and update  Disc pros/cons of hormone containing meds         Other   HYPERCHOLESTEROLEMIA, PURE    Disc goals for lipids and reasons to control them Rev last labs with pt Rev low sat fat diet in detail Plan to continue lovastatin 40 mg daily       Relevant Medications   lovastatin (MEVACOR) 40 MG tablet   Morbid obesity ([place])    Discussed how this problem influences overall health and the risks it imposes  Reviewed plan for weight loss with lower calorie diet (via better food choices and  also portion control or program like weight watchers) and exercise building up to or more than 30 minutes 5 days per week including some aerobic activity   [facility]  Commended re: exercise        Elevated serum creatinine    Improved some  GFR at 48.2 Strongly enc better fluid intake  Will monitor       Routine general medical examination at a health care facility - Primary    Reviewed health habits including diet and exercise and skin cancer prevention Reviewed appropriate screening tests for age  Also reviewed health mt list, fam hx and immunization status , as well as social and family history   See HPI Labs reviewed  Mammogram and colonoscopy utd shingrix given today  Enc she get a covid booster a month after shingrix        Relevant Orders   Varicella-zoster vaccine IM (Shingrix) (Completed)   Elevated glucose level    Lab Results  Component Value Date   HGBA1C 6.0 05/17/2021  disc imp of low glycemic diet and wt loss to prevent DM2        Contusion of left lower leg    This appears to have scarred-hyperpig area        Bipolar disorder, in partial remission, most recent episode mixed ([place])    Stable, under care of psychiatry  Stays motivated and positive       Other Visit Diagnoses     Need for shingles vaccine       Relevant Orders    Varicella-zoster vaccine IM (Shingrix) (Completed)

## 2021-05-22 NOTE — Patient Instructions (Addendum)
Get your covid booster at least a month after the shingrix vaccine (given today)   For diabetes prevention  Try to get most of your carbohydrates from produce (with the exception of white potatoes)  Eat less bread/pasta/rice/snack foods/cereals/sweets and other items from the middle of the grocery store (processed carbs)   For kidney health try and get 64 oz of fluids daily (mostly water)   If you can- try taking the prevacid every other day instead of daily  If that goes well- try stopping it  You will have a little acid rebound for a few days (tums are ok)   Try the estrace cream for vaginal dryness

## 2021-05-22 NOTE — Assessment & Plan Note (Signed)
Disc goals for lipids and reasons to control them Rev last labs with pt Rev low sat fat diet in detail Plan to continue lovastatin 40 mg daily

## 2021-05-22 NOTE — Assessment & Plan Note (Signed)
Hypothyroidism  Pt has no clinical changes No change in energy level/ hair or skin/ edema and no tremor Lab Results  Component Value Date   TSH 1.98 05/17/2021    Will plan to continue levothyroxine 50 mcg daily

## 2021-05-22 NOTE — Assessment & Plan Note (Signed)
Improved some  GFR at 48.2 Strongly enc better fluid intake  Will monitor

## 2021-05-22 NOTE — Assessment & Plan Note (Signed)
Reviewed health habits including diet and exercise and skin cancer prevention Reviewed appropriate screening tests for age  Also reviewed health mt list, fam hx and immunization status , as well as social and family history   See HPI Labs reviewed  Mammogram and colonoscopy utd shingrix given today  Enc she get a covid booster a month after shingrix

## 2021-05-22 NOTE — Assessment & Plan Note (Signed)
Discussed how this problem influences overall health and the risks it imposes  Reviewed plan for weight loss with lower calorie diet (via better food choices and also portion control or program like weight watchers) and exercise building up to or more than 30 minutes 5 days per week including some aerobic activity   [facility]  Commended re: exercise

## 2021-05-22 NOTE — Assessment & Plan Note (Signed)
I would like her to wean prevacid in the future if able in the interest of kidney health Will try taking it every other day to start  Disc acid rebound possible  Enc to watch diet

## 2021-05-22 NOTE — Assessment & Plan Note (Signed)
Px estrace cream to use twice weekly and update  Disc pros/cons of hormone containing meds

## 2021-05-30 ENCOUNTER — Ambulatory Visit: Payer: Medicare Other | Admitting: Psychology

## 2021-06-13 ENCOUNTER — Ambulatory Visit: Payer: Medicare Other | Admitting: Dermatology

## 2021-06-13 ENCOUNTER — Other Ambulatory Visit: Payer: Self-pay

## 2021-06-13 DIAGNOSIS — Q828 Other specified congenital malformations of skin: Secondary | ICD-10-CM | POA: Diagnosis not present

## 2021-06-13 DIAGNOSIS — L821 Other seborrheic keratosis: Secondary | ICD-10-CM

## 2021-06-13 DIAGNOSIS — L570 Actinic keratosis: Secondary | ICD-10-CM | POA: Diagnosis not present

## 2021-06-13 DIAGNOSIS — L578 Other skin changes due to chronic exposure to nonionizing radiation: Secondary | ICD-10-CM

## 2021-06-13 NOTE — Patient Instructions (Signed)

## 2021-06-13 NOTE — Progress Notes (Signed)
   Follow-Up Visit   Subjective  [name] [name] is a 65 y.o. female who presents for the following: Follow-up (Patient here for 2 month follow-up biopsy proven AK of the right central upper lip vermilion edge. She also has a couple of new spots on her right lower leg and right inguinal crease she would like checked.).    The following portions of the chart were reviewed this encounter and updated as appropriate:        Review of Systems:  No other skin or systemic complaints except as noted in HPI or Assessment and Plan.  Objective  Well appearing patient in no apparent distress; mood and affect are within normal limits.  A focused examination was performed including face, legs, groin. Relevant physical exam findings are noted in the Assessment and Plan.  R central upper lip Vermilion Pink biopsy site, no scale.  R medial lower leg Waxy pink tan macule with keratotic rim.   Assessment & Plan  Actinic Damage - chronic, secondary to cumulative UV radiation exposure/sun exposure over time - diffuse scaly erythematous macules with underlying dyspigmentation - Recommend daily broad spectrum sunscreen SPF 30+ to sun-exposed areas, reapply every 2 hours as needed.  - Recommend staying in the shade or wearing long sleeves, sun glasses (UVA+UVB protection) and wide brim hats (4-inch brim around the entire circumference of the hat). - Call for new or changing lesions.  Seborrheic Keratoses - Stuck-on, waxy, tan-brown papules and/or plaques of the right upper pretibia and right medial inguinal crease - Benign-appearing - Discussed benign etiology and prognosis. - Observe - Call for any changes  Actinic keratosis R central upper lip Vermilion  Biopsy proven. Clear. Observe for recurrence. Call clinic for new or changing lesions.  Recommend regular skin exams, daily broad-spectrum spf 30+ sunscreen use, and photoprotection.    Porokeratosis R medial lower leg  Benign, observe  for changes. Discussed cryotherapy if changes or becomes bothersome.  Return as scheduled for TBSE.  IJamesetta Orleans, CMA, am acting as scribe for Brendolyn Patty, MD .  Documentation: I have reviewed the above documentation for accuracy and completeness, and I agree with the above.  Brendolyn Patty MD

## 2021-06-27 ENCOUNTER — Ambulatory Visit (INDEPENDENT_AMBULATORY_CARE_PROVIDER_SITE_OTHER): Payer: Medicare Other | Admitting: Psychology

## 2021-06-27 DIAGNOSIS — F3132 Bipolar disorder, current episode depressed, moderate: Secondary | ICD-10-CM | POA: Diagnosis not present

## 2021-07-13 ENCOUNTER — Other Ambulatory Visit: Payer: Self-pay | Admitting: Family Medicine

## 2021-07-13 DIAGNOSIS — Z1231 Encounter for screening mammogram for malignant neoplasm of breast: Secondary | ICD-10-CM

## 2021-08-01 ENCOUNTER — Ambulatory Visit: Payer: Medicare Other

## 2021-08-07 ENCOUNTER — Ambulatory Visit (INDEPENDENT_AMBULATORY_CARE_PROVIDER_SITE_OTHER): Payer: Medicare Other | Admitting: Psychology

## 2021-08-07 DIAGNOSIS — F3132 Bipolar disorder, current episode depressed, moderate: Secondary | ICD-10-CM | POA: Diagnosis not present

## 2021-08-15 ENCOUNTER — Ambulatory Visit: Payer: Medicare Other | Admitting: Dermatology

## 2021-08-22 ENCOUNTER — Ambulatory Visit: Payer: Medicare Other | Admitting: Psychology

## 2021-09-01 ENCOUNTER — Other Ambulatory Visit: Payer: Self-pay

## 2021-09-01 ENCOUNTER — Ambulatory Visit
Admission: RE | Admit: 2021-09-01 | Discharge: 2021-09-01 | Disposition: A | Discharge status: Home / Self Care | Payer: Medicare Other | Source: Ambulatory Visit | Attending: Family Medicine | Admitting: Family Medicine

## 2021-09-01 DIAGNOSIS — Z1231 Encounter for screening mammogram for malignant neoplasm of breast: Secondary | ICD-10-CM

## 2021-09-06 ENCOUNTER — Ambulatory Visit (INDEPENDENT_AMBULATORY_CARE_PROVIDER_SITE_OTHER): Payer: Medicare Other | Admitting: Psychology

## 2021-09-06 DIAGNOSIS — F3132 Bipolar disorder, current episode depressed, moderate: Secondary | ICD-10-CM

## 2021-09-08 ENCOUNTER — Other Ambulatory Visit: Payer: Self-pay | Admitting: Family Medicine

## 2021-09-08 DIAGNOSIS — R928 Other abnormal and inconclusive findings on diagnostic imaging of breast: Secondary | ICD-10-CM

## 2021-09-26 ENCOUNTER — Ambulatory Visit: Payer: Medicare Other

## 2021-09-26 ENCOUNTER — Other Ambulatory Visit: Payer: Self-pay

## 2021-09-26 ENCOUNTER — Ambulatory Visit
Admission: RE | Admit: 2021-09-26 | Discharge: 2021-09-26 | Disposition: A | Discharge status: Home / Self Care | Payer: Medicare Other | Source: Ambulatory Visit | Attending: Family Medicine | Admitting: Family Medicine

## 2021-09-26 DIAGNOSIS — R928 Other abnormal and inconclusive findings on diagnostic imaging of breast: Secondary | ICD-10-CM

## 2021-09-26 DIAGNOSIS — R922 Inconclusive mammogram: Secondary | ICD-10-CM | POA: Diagnosis not present

## 2021-10-04 ENCOUNTER — Ambulatory Visit: Payer: Medicare Other | Admitting: Psychology

## 2021-10-05 ENCOUNTER — Telehealth: Payer: Self-pay | Admitting: Family Medicine

## 2021-10-05 NOTE — Telephone Encounter (Addendum)
Pt called in stating that she would like for the provider to do an appeal on the nurse visit for the shingrix shot that she received on 05/22/21. She called billing department and they said they don't file part d. Pt would like to be called

## 2021-10-05 NOTE — Telephone Encounter (Signed)
Will route to [facility] to see if anything can be done, we usually don't give shingirx vaccines to pt's with medicare because they only cover it at the pharmacy

## 2021-10-12 NOTE — Telephone Encounter (Signed)
I always recommend that patients with medicare get shingrix at a pharmacy. For folks without medicare I recommend that they check on coverage first before getting it here  This is the smart phrase I use : If you are interested in the shingles vaccine series (Shingrix), call your insurance or pharmacy to check on coverage and location it must be given.  If affordable - you can schedule it here or at your pharmacy depending on coverage   Perhaps since she isn't 38 yet someone assumed she did not have medicare?  In any case- I don't remember talking to her. If there is something I can do to write it off, let me know.

## 2021-10-12 NOTE — Telephone Encounter (Signed)
Yes, this is a patient who should have gotten this at the pharmacy.   Dr. Glori Bickers,   I am not sure if you remember discussing this with patient but if we offered this to her then we should probably write this off.  Do you remember this discussion? Are you okay with write off given that we should have instructed her based on insurance to get from pharmacy?   Just let me know.

## 2021-10-17 ENCOUNTER — Ambulatory Visit: Payer: Medicare Other | Admitting: Psychology

## 2021-10-19 ENCOUNTER — Telehealth: Payer: Self-pay | Admitting: Family Medicine

## 2021-10-19 NOTE — Telephone Encounter (Signed)
I think [name] [name] and PCP were working on getting her vaccine cost waived, will route this message to PCP for review

## 2021-10-19 NOTE — Telephone Encounter (Signed)
Will cc to [facility]

## 2021-10-19 NOTE — Telephone Encounter (Signed)
Pt called stating that she needs a order to get her shingle shot at [facility] in [place]. Pt states that she had to pay out of pocket when she got her last shingle shot here.Pt would like to be contacted when it is done.

## 2021-11-10 ENCOUNTER — Ambulatory Visit (INDEPENDENT_AMBULATORY_CARE_PROVIDER_SITE_OTHER): Payer: Medicare Other | Admitting: Obstetrics and Gynecology

## 2021-11-10 ENCOUNTER — Encounter: Payer: Self-pay | Admitting: Obstetrics and Gynecology

## 2021-11-10 ENCOUNTER — Encounter: Payer: Self-pay | Admitting: Family

## 2021-11-10 ENCOUNTER — Other Ambulatory Visit: Payer: Self-pay

## 2021-11-10 ENCOUNTER — Ambulatory Visit (INDEPENDENT_AMBULATORY_CARE_PROVIDER_SITE_OTHER): Payer: Medicare Other | Admitting: Family

## 2021-11-10 VITALS — BP 122/70 | Ht 60.0 in | Wt 218.0 lb

## 2021-11-10 VITALS — BP 128/76 | HR 54 | Temp 97.1°F | Ht 60.25 in | Wt 216.0 lb

## 2021-11-10 DIAGNOSIS — N764 Abscess of vulva: Secondary | ICD-10-CM | POA: Diagnosis not present

## 2021-11-10 DIAGNOSIS — N750 Cyst of Bartholin's gland: Secondary | ICD-10-CM

## 2021-11-10 DIAGNOSIS — L089 Local infection of the skin and subcutaneous tissue, unspecified: Secondary | ICD-10-CM

## 2021-11-10 MED ORDER — CLINDAMYCIN PHOSPHATE 1 % EX GEL: CUTANEOUS | 0 refills | Status: DC

## 2021-11-10 MED ORDER — AMOXICILLIN-POT CLAVULANATE 875-125 MG PO TABS: 1.0000 | ORAL_TABLET | Freq: Two times a day (BID) | ORAL | 0 refills | Status: AC

## 2021-11-10 NOTE — Progress Notes (Signed)
Patient ID: [name] [name], female   DOB: [birth year], 65 y.o.   MRN: [id number]  Reason for Consult: Gynecologic Exam   Referred by Abner Greenspan, MD  Subjective:     HPI:  [name] [name] is a 65 y.o. female. She was sent from her PCP office with a labial abscess for treatment. She first noticed the lump about 2 days ago. She denies fevers or drainage.   Gynecological History  No LMP recorded. Patient is postmenopausal.  Past Medical History:  Diagnosis Date   Actinic keratosis 03/29/2021   R central upper lip   Allergic rhinitis    Basal cell carcinoma 10/16/2010   R lower pretibia    Bipolar 1 disorder (HCC)    Fissure, anal    GERD (gastroesophageal reflux disease)    History of basal cell carcinoma excision    ON FACE   History of squamous cell carcinoma excision    ON RIGHT LEG   Hyperlipidemia    Hypothyroidism    IBS (irritable bowel syndrome)    Liver cyst    Mallet finger of left hand    small finger   Family History  Problem Relation Age of Onset   Stroke Mother    Heart disease Mother    Hypertension Father    Diabetes Father    Hyperlipidemia Father    Diabetes Brother    Past Surgical History:  Procedure Laterality Date   CARDIAC CATHETERIZATION  04/1999   per pt normal   CARDIOVASCULAR STRESS TEST  02-23-2011  dr Chrissie Noa [facility] clinic)   normal nuclear study/  no ischemia/  ef 64%   CATARACT EXTRACTION W/ INTRAOCULAR LENS  IMPLANT, BILATERAL  2003   [facility]   CESAREAN SECTION  1984   CLOSED REDUCTION FINGER WITH PERCUTANEOUS PINNING Left 04/22/2014   Procedure: LEFT SMALL FINGER CLOSED REDUCTION PINNING;  Surgeon: Linna Hoff, MD;  Location: [facility];  Service: Orthopedics;  Laterality: Left;   COLONOSCOPY  11/2002   LAPAROSCOPIC CHOLECYSTECTOMY  03-18-2000   TRANSTHORACIC ECHOCARDIOGRAM  02-23-2011   normal lvf/  ef 58%/  mild rve/   mild lae/  mild  mr  & tr/  mild pulmonary htn   WISDOM TOOTH EXTRACTION       Short Social History:  Social History   Tobacco Use   Smoking status: Never   Smokeless tobacco: Never  Substance Use Topics   Alcohol use: Yes    Alcohol/week: 0.0 standard drinks    Comment: wine- rarely    Allergies  Allergen Reactions   Xylocaine [Lidocaine Hcl] Swelling    ALLERGIC TO ALL CAINES EXCEPT SENSORCAINE , AND MARCAINE   Codeine Nausea And Vomiting    REACTION: vomits blood   Lithium Swelling   Tegretol [Carbamazepine] Other (See Comments)    REACTION: skin crawls    Current Outpatient Medications  Medication Sig Dispense Refill   amoxicillin-clavulanate (AUGMENTIN) 875-125 MG tablet Take 1 tablet by mouth 2 (two) times daily for 10 days. 20 tablet 0   ARIPiprazole (ABILIFY) 10 MG tablet Take 10 mg by mouth daily.     aspirin 81 MG tablet Take 81 mg by mouth daily.     BIOTIN PO Take 1 tablet by mouth daily.     buPROPion (WELLBUTRIN XL) 150 MG 24 hr tablet Take three tablets by mouth every morning     cetirizine (ZYRTEC) 10 MG tablet Take 10 mg by mouth daily as needed for allergies.  Cholecalciferol (VITAMIN D) 2000 UNITS CAPS Take 1 capsule by mouth daily.     clonazePAM (KLONOPIN) 1 MG tablet Take 1 mg by mouth at bedtime as needed.     Coenzyme Q10 (COQ10 PO) Take 100 mg by mouth daily.     fluticasone (FLONASE) 50 MCG/ACT nasal spray Place 2 sprays into both nostrils daily as needed. 48 g 3   lamoTRIgine (LAMICTAL) 100 MG tablet Take 100 mg by mouth daily.     levothyroxine (SYNTHROID) 50 MCG tablet Take 1 tablet (50 mcg total) by mouth daily. 90 tablet 3   lovastatin (MEVACOR) 40 MG tablet Take 1 tablet (40 mg total) by mouth daily. 90 tablet 3   Multiple Vitamin (MULTIVITAMIN) tablet Take 1 tablet by mouth daily.     Probiotic Product (PROBIOTIC ADVANCED PO) Take by mouth.     tiZANidine (ZANAFLEX) 4 MG tablet Take 1 tablet by mouth at bedtime as needed.  0   zolpidem (AMBIEN) 10 MG tablet Take 10 mg by mouth at bedtime.     No current  facility-administered medications for this visit.    Review of Systems  Constitutional: Negative for chills, fatigue, fever and unexpected weight change.  HENT: Negative for trouble swallowing.  Eyes: Negative for loss of vision.  Respiratory: Negative for cough, shortness of breath and wheezing.  Cardiovascular: Negative for chest pain, leg swelling, palpitations and syncope.  GI: Negative for abdominal pain, blood in stool, diarrhea, nausea and vomiting.  GU: Negative for difficulty urinating, dysuria, frequency and hematuria.  Musculoskeletal: Negative for back pain, leg pain and joint pain.  Skin: Negative for rash.  Neurological: Negative for dizziness, headaches, light-headedness, numbness and seizures.  Psychiatric: Negative for behavioral problem, confusion, depressed mood and sleep disturbance.       Objective:  Objective   Vitals:   11/10/21 1606  BP: 122/70  Weight: 218 lb (98.9 kg)  Height: 5' (1.524 m)   Body mass index is 42.58 kg/m.  Physical Exam Genitourinary:     Assessment/Plan:     65 yo with vulvar abscess Continue Augmentin twice a day for 2 weeks Continue sitz baths twice a day Apply topical clindamycin twice a day.  Follow up in 1 week  More than 10 minutes were spent face to face with the patient in the room, reviewing the medical record, labs and images, and coordinating care for the patient. The plan of management was discussed in detail and counseling was provided.       Adrian Prows MD [facility] 11/10/2021 4:48 PM

## 2021-11-10 NOTE — Assessment & Plan Note (Signed)
Stat referral placed for gynecology patient to receive phone call probably sometime today to schedule.  Patient needs irrigation and drainage of Bartholin cyst.  Sent antibiotics to pharmacy patient is starting to hysterectomy.  Patient continue with sitz bath's.  Handout given to patient with more details caring for Bartholin cyst.

## 2021-11-10 NOTE — Patient Instructions (Signed)
A referral was placed today. Please let us know if you have not heard back within 1 week about your referral.  I have sent an antibiotic to your preferred pharmacy, please start taking this today as directed.  It was a pleasure seeing you today! Please do not hesitate to reach out with any questions and or concerns.  Regards,   Eugenia Pancoast

## 2021-11-10 NOTE — Patient Instructions (Signed)
How to Take a [employer] A sitz bath is a warm water bath that may be used to care for your rectum, genital area, or the area between your rectum and genitals (perineum). In a sitz bath, the water only comes up to your hips and covers your buttocks. A sitz bath may be done in a bathtub or with a portable sitz bath that fits over the toilet. Your health care provider may recommend a sitz bath to help: Relieve pain and discomfort after delivering a baby. Relieve pain and itching from hemorrhoids or anal fissures. Relieve pain after certain surgeries. Relax muscles that are sore or tight. How to take a sitz bath Take 3-4 sitz baths a day, or as many as told by your health care provider. Bathtub sitz bath To take a sitz bath in a bathtub: Partially fill a bathtub with warm water. The water should be deep enough to cover your hips and buttocks when you are sitting in the tub. Follow your health care provider's instructions if you are told to put medicine in the water. Sit in the water. Open the tub drain a little, and leave it open during your bath. Turn on the warm water again, enough to replace the water that is draining out. Keep the water running throughout your bath. This helps keep the water at the right level and temperature. Soak in the water for 15-20 minutes, or as long as told by your health care provider. When you are done, be careful when you stand up. You may feel dizzy. After the sitz bath, pat yourself dry. Do not rub your skin to dry it.  Over-the-toilet sitz bath To take a sitz bath with an over-the-toilet basin: Follow the manufacturer's instructions. Fill the basin with warm water. Follow your health care provider's instructions if you were told to put medicine in the water. Sit on the seat. Make sure the water covers your buttocks and perineum. Soak in the water for 15-20 minutes, or as long as told by your health care provider. After the sitz bath, pat yourself dry. Do not  rub your skin to dry it. Clean and dry the basin between uses. Discard the basin if it cracks, or according to the manufacturer's instructions.  Contact a health care provider if: Your pain or itching gets worse. Do not continue with sitz baths if your symptoms get worse. You have new symptoms. Do not continue with sitz baths until you talk with your health care provider. Summary A sitz bath is a warm water bath in which the water only comes up to your hips and covers your buttocks. A sitz bath may help relieve pain and discomfort after delivering a baby. It also may help with pain and itching from hemorrhoids or anal fissures, or pain after certain surgeries. It can also help to relax muscles that are sore or tight. Take 3-4 sitz baths a day, or as many as told by your health care provider. Soak in the water for 15-20 minutes. Do not continue with sitz baths if your symptoms get worse. This information is not intended to replace advice given to you by your health care provider. Make sure you discuss any questions you have with your health care provider. Document Revised: 08/11/2020 Document Reviewed: 08/11/2020 Elsevier Patient Education  2022 [employer].

## 2021-11-10 NOTE — Progress Notes (Signed)
Established Patient Office Visit  Subjective:  Patient ID: [name] [name], female    DOB: [birth year]  Age: 65 y.o. MRN: [id number]  CC:  Chief Complaint  Patient presents with   Cyst    Bartholin's cyst right side of vagina     HPI [name] [name] is here today with c/o two day h/o right sided swollen tender 'mass' on the right side of her vaginal opening. Doing sitz baths which is helping only slightly. She denies any drainage, fever and or chills. Denies vaginal discharge and or urinary symptoms.   Had bartholin cyst in the past, but not in about 15 years per pt. In the past she did see a gynecologist, but none recent.   Past Medical History:  Diagnosis Date   Actinic keratosis 03/29/2021   R central upper lip   Allergic rhinitis    Basal cell carcinoma 10/16/2010   R lower pretibia    Bipolar 1 disorder (HCC)    Fissure, anal    GERD (gastroesophageal reflux disease)    History of basal cell carcinoma excision    ON FACE   History of squamous cell carcinoma excision    ON RIGHT LEG   Hyperlipidemia    Hypothyroidism    IBS (irritable bowel syndrome)    Liver cyst    Mallet finger of left hand    small finger    Past Surgical History:  Procedure Laterality Date   CARDIAC CATHETERIZATION  04/1999   per pt normal   CARDIOVASCULAR STRESS TEST  02-23-2011  dr Chrissie Noa [facility] clinic)   normal nuclear study/  no ischemia/  ef 64%   CATARACT EXTRACTION W/ INTRAOCULAR LENS  IMPLANT, BILATERAL  2003   Bil   CESAREAN SECTION  1984   CLOSED REDUCTION FINGER WITH PERCUTANEOUS PINNING Left 04/22/2014   Procedure: LEFT SMALL FINGER CLOSED REDUCTION PINNING;  Surgeon: Linna Hoff, MD;  Location: [facility];  Service: Orthopedics;  Laterality: Left;   COLONOSCOPY  11/2002   LAPAROSCOPIC CHOLECYSTECTOMY  03-18-2000   TRANSTHORACIC ECHOCARDIOGRAM  02-23-2011   normal lvf/  ef 58%/  mild rve/   mild lae/  mild  mr  & tr/  mild pulmonary htn   WISDOM  TOOTH EXTRACTION      Family History  Problem Relation Age of Onset   Stroke Mother    Heart disease Mother    Hypertension Father    Diabetes Father    Hyperlipidemia Father    Diabetes Brother     Social History   Socioeconomic History   Marital status: Married    Spouse name: Not on file   Number of children: 1   Years of education: Not on file   Highest education level: Not on file  Occupational History   Occupation: Home    Employer: RETIRED  Tobacco Use   Smoking status: Never   Smokeless tobacco: Never  Vaping Use   Vaping Use: Never used  Substance and Sexual Activity   Alcohol use: Yes    Alcohol/week: 0.0 standard drinks    Comment: wine- rarely   Drug use: No   Sexual activity: Not Currently    Birth control/protection: Post-menopausal  Other Topics Concern   Not on file  Social History Narrative   Moved here from [place]   Social Determinants of Health   Financial Resource Strain: Low Risk    Difficulty of Paying Living Expenses: Not hard at all  Food Insecurity: No  Food Insecurity   Worried About Charity fundraiser in the Last Year: Never true   Ran Out of Food in the Last Year: Never true  Transportation Needs: No Transportation Needs   Lack of Transportation (Medical): No   Lack of Transportation (Non-Medical): No  Physical Activity: Sufficiently Active   Days of Exercise per Week: 4 days   Minutes of Exercise per Session: 60 min  Stress: No Stress Concern Present   Feeling of Stress : Not at all  Social Connections: Not on file  Intimate Partner Violence: Not At Risk   Fear of Current or Ex-Partner: No   Emotionally Abused: No   Physically Abused: No   Sexually Abused: No    Outpatient Medications Prior to Visit  Medication Sig Dispense Refill   ARIPiprazole (ABILIFY) 10 MG tablet Take 10 mg by mouth daily.     aspirin 81 MG tablet Take 81 mg by mouth daily.     BIOTIN PO Take 1 tablet by mouth daily.     buPROPion (WELLBUTRIN XL) 150  MG 24 hr tablet Take three tablets by mouth every morning     cetirizine (ZYRTEC) 10 MG tablet Take 10 mg by mouth daily as needed for allergies.     Cholecalciferol (VITAMIN D) 2000 UNITS CAPS Take 1 capsule by mouth daily.     clonazePAM (KLONOPIN) 1 MG tablet Take 1 mg by mouth at bedtime as needed.     Coenzyme Q10 (COQ10 PO) Take 100 mg by mouth daily.     fluticasone (FLONASE) 50 MCG/ACT nasal spray Place 2 sprays into both nostrils daily as needed. 48 g 3   lamoTRIgine (LAMICTAL) 100 MG tablet Take 100 mg by mouth daily.     levothyroxine (SYNTHROID) 50 MCG tablet Take 1 tablet (50 mcg total) by mouth daily. 90 tablet 3   lovastatin (MEVACOR) 40 MG tablet Take 1 tablet (40 mg total) by mouth daily. 90 tablet 3   Multiple Vitamin (MULTIVITAMIN) tablet Take 1 tablet by mouth daily.     Probiotic Product (PROBIOTIC ADVANCED PO) Take by mouth.     tiZANidine (ZANAFLEX) 4 MG tablet Take 1 tablet by mouth at bedtime as needed.  0   zolpidem (AMBIEN) 10 MG tablet Take 10 mg by mouth at bedtime.     ARIPiprazole (ABILIFY) 15 MG tablet Take 1 tablet by mouth every other day. (30 mg on the days she doesn't take 15 mg)     ARIPiprazole (ABILIFY) 30 MG tablet Take 30 mg by mouth every other day. (15 mg on the days she doesn't take 30mg )     estradiol (ESTRACE VAGINAL) 0.1 MG/GM vaginal cream Use small amount (1 cm) of cream vaginally twice weekly 42.5 g 2   lansoprazole (PREVACID) 30 MG capsule Take 1 capsule (30 mg total) by mouth daily. 90 capsule 3   No facility-administered medications prior to visit.    Allergies  Allergen Reactions   Xylocaine [Lidocaine Hcl] Swelling    ALLERGIC TO ALL CAINES EXCEPT SENSORCAINE , AND MARCAINE   Codeine Nausea And Vomiting    REACTION: vomits blood   Lithium Swelling   Tegretol [Carbamazepine] Other (See Comments)    REACTION: skin crawls    ROS Review of Systems  Constitutional:  Negative for chills and fever.  Cardiovascular:  Negative for chest  pain.  Genitourinary:  Positive for genital sores. Negative for difficulty urinating, frequency and urgency. Hematuria: right vaginal opening abscess, very tender, no drainage. redness.  Objective:    Physical Exam Constitutional:      General: She is not in acute distress.    Appearance: Normal appearance. She is obese. She is not ill-appearing or toxic-appearing.  Pulmonary:     Effort: Pulmonary effort is normal.  Genitourinary:    Labia:        Right: No rash or tenderness.        Left: No rash or tenderness.     Neurological:     Mental Status: She is alert.    BP 128/76   Pulse (!) 54   Temp (!) 97.1 F (36.2 C) (Temporal)   Ht 5' 0.25" (1.53 m)   Wt 216 lb (98 kg)   SpO2 98%   BMI 41.84 kg/m  Wt Readings from Last 3 Encounters:  11/10/21 216 lb (98 kg)  05/22/21 216 lb 4 oz (98.1 kg)  03/20/21 218 lb (98.9 kg)     Health Maintenance Due  Topic Date Due   Pneumococcal Vaccine 25-65 Years old (3 - PCV) 10/16/2014   COVID-19 Vaccine (3 - Pfizer risk series) 04/20/2020   INFLUENZA VACCINE  07/10/2021   Zoster Vaccines- Shingrix (2 of 2) 07/17/2021    There are no preventive care reminders to display for this patient.  Lab Results  Component Value Date   TSH 1.98 05/17/2021   Lab Results  Component Value Date   WBC 4.7 05/17/2021   HGB 13.3 05/17/2021   HCT 40.5 05/17/2021   MCV 89.6 05/17/2021   PLT 223.0 05/17/2021   Lab Results  Component Value Date   NA 142 05/17/2021   K 4.5 05/17/2021   CO2 29 05/17/2021   GLUCOSE 95 05/17/2021   BUN 17 05/17/2021   CREATININE 1.20 05/17/2021   BILITOT 0.4 05/17/2021   ALKPHOS 72 05/17/2021   AST 20 05/17/2021   ALT 14 05/17/2021   PROT 6.9 05/17/2021   ALBUMIN 4.4 05/17/2021   CALCIUM 9.5 05/17/2021   ANIONGAP 5 (L) 06/20/2014   GFR 48.28 (L) 05/17/2021   Lab Results  Component Value Date   HGBA1C 6.0 05/17/2021      Assessment & Plan:   Problem List Items Addressed This Visit        Genitourinary   Cyst of right Bartholin's gland duct - Primary    Stat referral placed for gynecology patient to receive phone call probably sometime today to schedule.  Patient needs irrigation and drainage of Bartholin cyst.  Sent antibiotics to pharmacy patient is starting to hysterectomy.  Patient continue with sitz bath's.  Handout given to patient with more details caring for Bartholin cyst.      Relevant Medications   amoxicillin-clavulanate (AUGMENTIN) 875-125 MG tablet   Other Relevant Orders   Ambulatory referral to Gynecology    Meds ordered this encounter  Medications   amoxicillin-clavulanate (AUGMENTIN) 875-125 MG tablet    Sig: Take 1 tablet by mouth 2 (two) times daily for 10 days.    Dispense:  20 tablet    Refill:  0    Order Specific Question:   Supervising Provider    Answer:   Diona Browner, AMY E [[id number]]    Follow-up: Return for with GYN for further eval/treat.    Eugenia Pancoast, FNP

## 2021-11-20 ENCOUNTER — Other Ambulatory Visit: Payer: Self-pay

## 2021-11-20 ENCOUNTER — Encounter: Payer: Self-pay | Admitting: Obstetrics and Gynecology

## 2021-11-20 ENCOUNTER — Ambulatory Visit: Payer: Medicare Other | Admitting: Obstetrics and Gynecology

## 2021-11-20 VITALS — BP 122/72 | Ht 60.5 in | Wt 214.0 lb

## 2021-11-20 DIAGNOSIS — L280 Lichen simplex chronicus: Secondary | ICD-10-CM

## 2021-11-20 MED ORDER — CLOBETASOL PROPIONATE 0.05 % EX OINT: TOPICAL_OINTMENT | CUTANEOUS | 5 refills | Status: DC

## 2021-11-20 NOTE — Progress Notes (Signed)
Patient ID: [name] [name], female   DOB: 1956/01/17, 65 y.o.   MRN: [id number]  Reason for Consult: Follow-up   Referred by Abner Greenspan, MD  Subjective:     HPI:  [name] [name] is a 65 y.o. female she presents today for follow-up of a right perineal abscess.  She has completed Augmentin and reports that her bottom is feeling bit better.  She has been applying Neosporin and topical clindamycin although she noted a rash yesterday and discontinued this.  She has been washing her bottom with soap and notes that when she does she has itching which she scratches with her washcloth.  Especially this itching is located on the left.  Gynecological History  No LMP recorded. Patient is postmenopausal.  Past Medical History:  Diagnosis Date   Actinic keratosis 03/29/2021   R central upper lip   Allergic rhinitis    Basal cell carcinoma 10/16/2010   R lower pretibia    Bipolar 1 disorder (HCC)    Fissure, anal    GERD (gastroesophageal reflux disease)    History of basal cell carcinoma excision    ON FACE   History of squamous cell carcinoma excision    ON RIGHT LEG   Hyperlipidemia    Hypothyroidism    IBS (irritable bowel syndrome)    Liver cyst    Mallet finger of left hand    small finger   Family History  Problem Relation Age of Onset   Stroke Mother    Heart disease Mother    Hypertension Father    Diabetes Father    Hyperlipidemia Father    Diabetes Brother    Past Surgical History:  Procedure Laterality Date   CARDIAC CATHETERIZATION  04/1999   per pt normal   CARDIOVASCULAR STRESS TEST  02-23-2011  dr Chrissie Noa [facility] clinic)   normal nuclear study/  no ischemia/  ef 64%   CATARACT EXTRACTION W/ INTRAOCULAR LENS  IMPLANT, BILATERAL  2003   Bil   CESAREAN SECTION  1984   CLOSED REDUCTION FINGER WITH PERCUTANEOUS PINNING Left 04/22/2014   Procedure: LEFT SMALL FINGER CLOSED REDUCTION PINNING;  Surgeon: Linna Hoff, MD;  Location: [facility];  Service: Orthopedics;  Laterality: Left;   COLONOSCOPY  11/2002   LAPAROSCOPIC CHOLECYSTECTOMY  03-18-2000   TRANSTHORACIC ECHOCARDIOGRAM  02-23-2011   normal lvf/  ef 58%/  mild rve/   mild lae/  mild  mr  & tr/  mild pulmonary htn   WISDOM TOOTH EXTRACTION      Short Social History:  Social History   Tobacco Use   Smoking status: Never   Smokeless tobacco: Never  Substance Use Topics   Alcohol use: Yes    Alcohol/week: 0.0 standard drinks    Comment: wine- rarely    Allergies  Allergen Reactions   Xylocaine [Lidocaine Hcl] Swelling    ALLERGIC TO ALL CAINES EXCEPT SENSORCAINE , AND MARCAINE   Codeine Nausea And Vomiting    REACTION: vomits blood   Lithium Swelling   Tegretol [Carbamazepine] Other (See Comments)    REACTION: skin crawls    Current Outpatient Medications  Medication Sig Dispense Refill   amoxicillin-clavulanate (AUGMENTIN) 875-125 MG tablet Take 1 tablet by mouth 2 (two) times daily for 10 days. 20 tablet 0   ARIPiprazole (ABILIFY) 10 MG tablet Take 10 mg by mouth daily.     aspirin 81 MG tablet Take 81 mg by mouth daily.  BIOTIN PO Take 1 tablet by mouth daily.     buPROPion (WELLBUTRIN XL) 150 MG 24 hr tablet Take three tablets by mouth every morning     cetirizine (ZYRTEC) 10 MG tablet Take 10 mg by mouth daily as needed for allergies.     Cholecalciferol (VITAMIN D) 2000 UNITS CAPS Take 1 capsule by mouth daily.     clindamycin (CLINDAGEL) 1 % gel Apply to affected area 2 times daily until resolution 30 g 0   clonazePAM (KLONOPIN) 1 MG tablet Take 1 mg by mouth at bedtime as needed.     Coenzyme Q10 (COQ10 PO) Take 100 mg by mouth daily.     fluticasone (FLONASE) 50 MCG/ACT nasal spray Place 2 sprays into both nostrils daily as needed. 48 g 3   lamoTRIgine (LAMICTAL) 100 MG tablet Take 100 mg by mouth daily.     levothyroxine (SYNTHROID) 50 MCG tablet Take 1 tablet (50 mcg total) by mouth daily. 90 tablet 3   lovastatin (MEVACOR) 40 MG  tablet Take 1 tablet (40 mg total) by mouth daily. 90 tablet 3   Multiple Vitamin (MULTIVITAMIN) tablet Take 1 tablet by mouth daily.     Probiotic Product (PROBIOTIC ADVANCED PO) Take by mouth.     tiZANidine (ZANAFLEX) 4 MG tablet Take 1 tablet by mouth at bedtime as needed.  0   zolpidem (AMBIEN) 10 MG tablet Take 10 mg by mouth at bedtime.     No current facility-administered medications for this visit.    Review of Systems  Constitutional: Negative for chills, fatigue, fever and unexpected weight change.  HENT: Negative for trouble swallowing.  Eyes: Negative for loss of vision.  Respiratory: Negative for cough, shortness of breath and wheezing.  Cardiovascular: Negative for chest pain, leg swelling, palpitations and syncope.  GI: Negative for abdominal pain, blood in stool, diarrhea, nausea and vomiting.  GU: Negative for difficulty urinating, dysuria, frequency and hematuria.  Musculoskeletal: Negative for back pain, leg pain and joint pain.  Skin: Negative for rash.  Neurological: Negative for dizziness, headaches, light-headedness, numbness and seizures.  Psychiatric: Negative for behavioral problem, confusion, depressed mood and sleep disturbance.       Objective:  Objective   Vitals:   11/20/21 0951  BP: 122/72  Weight: 214 lb (97.1 kg)  Height: 5' 0.5" (1.537 m)   Body mass index is 41.11 kg/m.  Physical Exam Vitals and nursing note reviewed. Exam conducted with a chaperone present.  Constitutional:      Appearance: Normal appearance.  HENT:     Head: Normocephalic and atraumatic.  Eyes:     Extraocular Movements: Extraocular movements intact.     Pupils: Pupils are equal, round, and reactive to light.  Cardiovascular:     Rate and Rhythm: Normal rate and regular rhythm.  Pulmonary:     Effort: Pulmonary effort is normal.     Breath sounds: Normal breath sounds.  Abdominal:     General: Abdomen is flat.     Palpations: Abdomen is soft.   Genitourinary:   Musculoskeletal:     Cervical back: Normal range of motion.  Skin:    General: Skin is warm and dry.  Neurological:     General: No focal deficit present.     Mental Status: She is alert and oriented to person, place, and time.  Psychiatric:        Behavior: Behavior normal.        Thought Content: Thought content normal.  Judgment: Judgment normal.    Assessment/Plan:     65 year old with right perineal abscess.  Abscess appears to have resolved. Patient seems to developed a mild lichen simplex chronicus.  Advised to stop itching.  Also advised to stop washing her bottom with soap.  Advised just to soak in warm water and then apply topical steroid ointment.  We will follow-up in 4 weeks to monitor for improvement.  More than 20 minutes were spent face to face with the patient in the room, reviewing the medical record, labs and images, and coordinating care for the patient. The plan of management was discussed in detail and counseling was provided.    Adrian Prows MD [facility], [facility] 11/20/2021 9:59 AM

## 2021-11-29 ENCOUNTER — Ambulatory Visit (INDEPENDENT_AMBULATORY_CARE_PROVIDER_SITE_OTHER): Payer: Medicare Other | Admitting: Psychology

## 2021-11-29 DIAGNOSIS — F3181 Bipolar II disorder: Secondary | ICD-10-CM

## 2021-11-29 NOTE — Progress Notes (Signed)
[facility] Counselor/Therapist Progress Note  Patient ID: [name] [name], MRN: [id number],    Date: 11/29/2021  Time Spent: 50 minutes  Treatment Type: Individual Therapy  Reported Symptoms: none  Mental Status Exam: Appearance:  Casual     Behavior: Appropriate  Motor: Normal  Speech/Language:  Normal Rate  Affect: Appropriate  Mood: normal  Thought process: normal  Thought content:   WNL  Sensory/Perceptual disturbances:   WNL  Orientation: oriented to person, place, time/date, and situation  Attention: Good  Concentration: Good  Memory: WNL  Fund of knowledge:  Good  Insight:   Good  Judgment:  Good  Impulse Control: Good   Risk Assessment: Danger to Self:  No Self-injurious Behavior: No Danger to Others: No Duty to Warn:no Physical Aggression / Violence:No  Access to Firearms a concern: No  Gang Involvement:No   Subjective: The patient attended a face-to-face individual therapy session via video visit today.  The patient agreed and consented for this session to be on caregility.  The patient was in her home alone and the therapist was in the office.  The patient reports that she has been doing very well since I saw her last.  The patient states that she had an appointment with her psychiatrist last week and she decreased her medication for tremors and also decreased her Abilify.  The patient reports that she has been more stable and has not had any problems with the tremor in her mouth since she decreased the medication.  The patient reports that she had a big party last weekend and it went well and she feels like she is handling her anxiety and stress better.  We talked about going to 8 weeks for check-in appointments and scheduled her next appointment.  Interventions: Cognitive Behavioral Therapy  Diagnosis:Bipolar II disorder ([place])  Plan: Please see treatment plan in Therapy Charts with target date of 05/07/2022 for goals and progress towards  goals.  The patient approved this treatment plan and is making progress towards goals.  [name] [name] G Xanthe Couillard, LCSW

## 2021-12-15 ENCOUNTER — Encounter: Payer: Self-pay | Admitting: Emergency Medicine

## 2021-12-15 ENCOUNTER — Other Ambulatory Visit: Payer: Self-pay

## 2021-12-15 DIAGNOSIS — R109 Unspecified abdominal pain: Secondary | ICD-10-CM | POA: Insufficient documentation

## 2021-12-15 DIAGNOSIS — R112 Nausea with vomiting, unspecified: Secondary | ICD-10-CM | POA: Insufficient documentation

## 2021-12-15 DIAGNOSIS — Z4801 Encounter for change or removal of surgical wound dressing: Secondary | ICD-10-CM | POA: Diagnosis not present

## 2021-12-15 DIAGNOSIS — R197 Diarrhea, unspecified: Secondary | ICD-10-CM | POA: Diagnosis not present

## 2021-12-15 DIAGNOSIS — N764 Abscess of vulva: Secondary | ICD-10-CM | POA: Diagnosis not present

## 2021-12-15 DIAGNOSIS — L24A9 Irritant contact dermatitis due friction or contact with other specified body fluids: Secondary | ICD-10-CM | POA: Insufficient documentation

## 2021-12-15 NOTE — ED Triage Notes (Signed)
Pt c/o a wound on her vagina. Pt states that she had one last month in the same place that went away after being on Augmentin.

## 2021-12-16 ENCOUNTER — Emergency Department
Admission: EM | Admit: 2021-12-16 | Discharge: 2021-12-16 | Disposition: A | Discharge status: Home / Self Care | Payer: Medicare HMO | Attending: Emergency Medicine | Admitting: Emergency Medicine

## 2021-12-16 DIAGNOSIS — L24A9 Irritant contact dermatitis due friction or contact with other specified body fluids: Secondary | ICD-10-CM

## 2021-12-16 MED ORDER — AMOXICILLIN-POT CLAVULANATE 875-125 MG PO TABS: 1.0000 | ORAL_TABLET | Freq: Two times a day (BID) | ORAL | 0 refills | Status: AC

## 2021-12-16 NOTE — ED Provider Notes (Signed)
[facility] Provider Note    Event Date/Time   First MD Initiated Contact with Patient 12/16/21 0200     (approximate)   History   Abscess   HPI  [name] [name] is a 66 y.o. female who presents for evaluation of a possible wound or abscess in her genital region.  She reports that she had a similar issue about a month ago and was seen by a provider who sent her to Dr. Gilman Schmidt with OB/GYN.  Dr. Gilman Schmidt did not feel the patient required incision and drainage but gave her a prescription for clindamycin ointment and Augmentin with instructions to do sitz baths.  Fortunately the issue resolved.  However she discovered earlier this afternoon that she had some blood in her underwear and when she checked she feels like the wound may be open again.  It is not causing her any pain and does not feel swollen like it was the last time, but she wanted to address it before I got worse.  She denies fever, nausea, vomiting, and any pain in the region.  She has not had any additional blood after the initial discovery.  She reports that when she took Augmentin previously she had a lot of stomach upset including nausea, vomiting, diarrhea, and some cramping abdominal pain.      Physical Exam   Triage Vital Signs: ED Triage Vitals [12/15/21 2153]  Enc Vitals Group     BP (!) 183/78     Pulse Rate 60     Resp 18     Temp 98.7 F (37.1 C)     Temp src      SpO2 98 %     Weight 98 kg (216 lb)     Height 1.524 m (5')     Head Circumference      Peak Flow      Pain Score 0     Pain Loc      Pain Edu?      Excl. in [place]?     Most recent vital signs: Vitals:   12/15/21 2153 12/16/21 0253  BP: (!) 183/78 (!) 171/77  Pulse: 60 60  Resp: 18 18  Temp: 98.7 F (37.1 C)   SpO2: 98% 99%     General: Awake, no distress.  CV:  Good peripheral perfusion.  Resp:  Normal effort.  Abd:  No distention.  No tenderness to palpation. Other:  Genital exam is notable for a small  open wound at the area of the prior infection on the right outer labia.  There is a very small area of induration which is about the size of a pea, but no fluctuance, no induration, no drainage, no erythema, and no tenderness to palpation or when expressing the wound.      IMPRESSION / MDM / ASSESSMENT AND PLAN / ED COURSE  I reviewed the triage vital signs and the nursing notes.                              Differential diagnosis includes, but is not limited to, recurrent abscess, cellulitis, perianal or perirectal abscess, Bartholin cyst/abscess, vulvar abscess.   The patient's vital signs are stable and within normal limits other than hypertension.  She has no reported history of diabetes which would make possible infection and potentially more complicated.  She is in no distress and has no pain or tenderness to palpation, and she is  just concerned based on her history.  I performed an exam of her genitalia with her nurse April in the room as a chaperone.  There is no evidence of active infection at this time although she has a very small area of induration and an obvious wound or "hole" at the area of prior infection.  I reviewed the medical record including the initial office visit 12/2 with FNP Eugenia Pancoast and the subsequent OB/GYN clinic visit with Dr. Gilman Schmidt.  I was able to review the photo taken by Dr. Gilman Schmidt and saw that the wound is indeed in the same place but at the time, just over a month ago, there was an obvious area of infection and inflammation that is not present currently.  There is no indication for lab work at this time.  Given the risk of recurrent infection, encourage the patient to continue using the clindamycin ointment and to continue with sitz baths, but given that she has significant side effects to Augmentin last time, I wrote her prescription but encouraged her to only fill it and start using it if she feels like the area is becoming worse including increased  swelling or discharge.  She already has a follow-up appointment with Dr. Gilman Schmidt after the weekend (about 4 days) and I encouraged her to keep that appointment.  I also gave her strict return precautions.  I considered whether the patient needs medication in the emergency department or even admission, but at this time based on her physical exam and reassuring vital signs and no warning signs or symptoms, I believe she is appropriate for discharge and outpatient follow-up with the plan as described above.  The patient and her husband understand and agree.         FINAL CLINICAL IMPRESSION(S) / ED DIAGNOSES   Final diagnoses:  Wound drainage     Rx / DC Orders   ED Discharge Orders          Ordered    amoxicillin-clavulanate (AUGMENTIN) 875-125 MG tablet  Every 12 hours        12/16/21 0234             Note:  This document was prepared using Dragon voice recognition software and may include unintentional dictation errors.   Hinda Kehr, MD 12/16/21 [phone]

## 2021-12-16 NOTE — Discharge Instructions (Signed)
As we discussed, your wound does not look bad at all tonight, certainly much better than it did a month ago.  We recommend you start using sitz baths again and start again using the topical clindamycin you were prescribed previously.  We gave you a prescription for Augmentin, but please only fill it and start taking it if your wound starts getting worse, becomes painful, etc.  We recommend you try avoiding taking the Augmentin until you can follow-up with Dr. Gilman Schmidt, because at this moment it does not appear you need the oral antibiotics.    Return to the emergency department if you develop new or worsening symptoms that concern you.

## 2021-12-18 DIAGNOSIS — S63501A Unspecified sprain of right wrist, initial encounter: Secondary | ICD-10-CM | POA: Diagnosis not present

## 2021-12-18 DIAGNOSIS — M25531 Pain in right wrist: Secondary | ICD-10-CM | POA: Diagnosis not present

## 2021-12-19 ENCOUNTER — Ambulatory Visit: Payer: Medicare Other | Admitting: Obstetrics and Gynecology

## 2021-12-19 ENCOUNTER — Encounter: Payer: Self-pay | Admitting: Obstetrics and Gynecology

## 2021-12-19 ENCOUNTER — Ambulatory Visit: Payer: Medicare HMO | Admitting: Obstetrics and Gynecology

## 2021-12-19 ENCOUNTER — Other Ambulatory Visit: Payer: Self-pay

## 2021-12-19 VITALS — BP 140/80 | Ht 62.0 in | Wt 215.2 lb

## 2021-12-19 DIAGNOSIS — L0291 Cutaneous abscess, unspecified: Secondary | ICD-10-CM | POA: Diagnosis not present

## 2021-12-19 NOTE — Progress Notes (Signed)
Patient ID: [name] [name], female   DOB: 1956-09-23, 66 y.o.   MRN: [id number]  Reason for Consult: No chief complaint on file.   Referred by Abner Greenspan, MD  Subjective:     HPI:  [name] [name] is a 66 y.o. female she is following up today regarding a right perineal abscess.  Abscess has healed well.  Patient recently injured her arm.  She has no new complaints.  Gynecological History  No LMP recorded. Patient is postmenopausal.  Past Medical History:  Diagnosis Date   Actinic keratosis 03/29/2021   R central upper lip   Allergic rhinitis    Basal cell carcinoma 10/16/2010   R lower pretibia    Bipolar 1 disorder (HCC)    Fissure, anal    GERD (gastroesophageal reflux disease)    History of basal cell carcinoma excision    ON FACE   History of squamous cell carcinoma excision    ON RIGHT LEG   Hyperlipidemia    Hypothyroidism    IBS (irritable bowel syndrome)    Liver cyst    Mallet finger of left hand    small finger   Family History  Problem Relation Age of Onset   Stroke Mother    Heart disease Mother    Hypertension Father    Diabetes Father    Hyperlipidemia Father    Diabetes Brother    Past Surgical History:  Procedure Laterality Date   CARDIAC CATHETERIZATION  04/1999   per pt normal   CARDIOVASCULAR STRESS TEST  02-23-2011  dr Chrissie Noa fath [facility])   normal nuclear study/  no ischemia/  ef 64%   CATARACT EXTRACTION W/ INTRAOCULAR LENS  IMPLANT, BILATERAL  2003   Bil   CESAREAN SECTION  1984   CLOSED REDUCTION FINGER WITH PERCUTANEOUS PINNING Left 04/22/2014   Procedure: LEFT SMALL FINGER CLOSED REDUCTION PINNING;  Surgeon: Linna Hoff, MD;  Location: [place];  Service: Orthopedics;  Laterality: Left;   COLONOSCOPY  11/2002   LAPAROSCOPIC CHOLECYSTECTOMY  03-18-2000   TRANSTHORACIC ECHOCARDIOGRAM  02-23-2011   normal lvf/  ef 58%/  mild rve/   mild lae/  mild  mr  & tr/  mild pulmonary htn   WISDOM TOOTH  EXTRACTION      Short Social History:  Social History   Tobacco Use   Smoking status: Never   Smokeless tobacco: Never  Substance Use Topics   Alcohol use: Yes    Alcohol/week: 0.0 standard drinks    Comment: wine- rarely    Allergies  Allergen Reactions   Xylocaine [Lidocaine Hcl] Swelling    ALLERGIC TO ALL CAINES EXCEPT SENSORCAINE , AND MARCAINE   Amantadine    Codeine Nausea And Vomiting    REACTION: vomits blood   Lithium Swelling   Tegretol [Carbamazepine] Other (See Comments)    REACTION: skin crawls    Current Outpatient Medications  Medication Sig Dispense Refill   amoxicillin-clavulanate (AUGMENTIN) 875-125 MG tablet Take 1 tablet by mouth every 12 (twelve) hours for 10 days. 20 tablet 0   ARIPiprazole (ABILIFY) 10 MG tablet Take 10 mg by mouth daily.     aspirin 81 MG tablet Take 81 mg by mouth daily.     BIOTIN PO Take 1 tablet by mouth daily.     buPROPion (WELLBUTRIN XL) 150 MG 24 hr tablet Take three tablets by mouth every morning     cetirizine (ZYRTEC) 10 MG tablet Take 10 mg by  mouth daily as needed for allergies.     Cholecalciferol (VITAMIN D) 2000 UNITS CAPS Take 1 capsule by mouth daily.     clindamycin (CLINDAGEL) 1 % gel Apply to affected area 2 times daily until resolution 30 g 0   clobetasol ointment (TEMOVATE) 0.05 % Apply to affected area every night for 4 weeks, then every other day for 4 weeks and then twice a week for 4 weeks or until resolution. 30 g 5   clonazePAM (KLONOPIN) 1 MG tablet Take 1 mg by mouth at bedtime as needed.     Coenzyme Q10 (COQ10 PO) Take 100 mg by mouth daily.     fluticasone (FLONASE) 50 MCG/ACT nasal spray Place 2 sprays into both nostrils daily as needed. 48 g 3   lamoTRIgine (LAMICTAL) 100 MG tablet Take 100 mg by mouth daily.     levothyroxine (SYNTHROID) 50 MCG tablet Take 1 tablet (50 mcg total) by mouth daily. 90 tablet 3   lovastatin (MEVACOR) 40 MG tablet Take 1 tablet (40 mg total) by mouth daily. 90 tablet  3   Multiple Vitamin (MULTIVITAMIN) tablet Take 1 tablet by mouth daily.     Probiotic Product (PROBIOTIC ADVANCED PO) Take by mouth.     tiZANidine (ZANAFLEX) 4 MG tablet Take 1 tablet by mouth at bedtime as needed.  0   zolpidem (AMBIEN) 10 MG tablet Take 10 mg by mouth at bedtime.     No current facility-administered medications for this visit.    Review of Systems  Constitutional: Negative for chills, fatigue, fever and unexpected weight change.  HENT: Negative for trouble swallowing.  Eyes: Negative for loss of vision.  Respiratory: Negative for cough, shortness of breath and wheezing.  Cardiovascular: Negative for chest pain, leg swelling, palpitations and syncope.  GI: Negative for abdominal pain, blood in stool, diarrhea, nausea and vomiting.  GU: Negative for difficulty urinating, dysuria, frequency and hematuria.  Musculoskeletal: Negative for back pain, leg pain and joint pain.  Skin: Negative for rash.  Neurological: Negative for dizziness, headaches, light-headedness, numbness and seizures.  Psychiatric: Negative for behavioral problem, confusion, depressed mood and sleep disturbance.       Objective:  Objective   Vitals:   12/19/21 1559  BP: 140/80  Weight: 215 lb 3.2 oz (97.6 kg)  Height: 5\' 2"  (1.575 m)   Body mass index is 39.36 kg/m.  Physical Exam Genitourinary:     Assessment/Plan:     66 year old following up regarding perineal abscess which is now resolved.  Small amount melanotic skin change noted at the draining of the right labia and clitoral hood.  Will monitor and follow-up in 2 months. Consider biopsy if needed  More than 20 minutes were spent face to face with the patient in the room, reviewing the medical record, labs and images, and coordinating care for the patient. The plan of management was discussed in detail and counseling was provided.     Adrian Prows MD [facility], [facility] 12/19/2021 5:10 PM

## 2021-12-26 ENCOUNTER — Other Ambulatory Visit: Payer: Self-pay

## 2021-12-26 ENCOUNTER — Ambulatory Visit: Payer: Medicare HMO | Admitting: Dermatology

## 2021-12-26 DIAGNOSIS — Z85828 Personal history of other malignant neoplasm of skin: Secondary | ICD-10-CM

## 2021-12-26 DIAGNOSIS — D229 Melanocytic nevi, unspecified: Secondary | ICD-10-CM

## 2021-12-26 DIAGNOSIS — L821 Other seborrheic keratosis: Secondary | ICD-10-CM

## 2021-12-26 DIAGNOSIS — D225 Melanocytic nevi of trunk: Secondary | ICD-10-CM | POA: Diagnosis not present

## 2021-12-26 DIAGNOSIS — L570 Actinic keratosis: Secondary | ICD-10-CM

## 2021-12-26 DIAGNOSIS — L719 Rosacea, unspecified: Secondary | ICD-10-CM | POA: Diagnosis not present

## 2021-12-26 DIAGNOSIS — D485 Neoplasm of uncertain behavior of skin: Secondary | ICD-10-CM

## 2021-12-26 DIAGNOSIS — D692 Other nonthrombocytopenic purpura: Secondary | ICD-10-CM | POA: Diagnosis not present

## 2021-12-26 DIAGNOSIS — Z1283 Encounter for screening for malignant neoplasm of skin: Secondary | ICD-10-CM

## 2021-12-26 DIAGNOSIS — L578 Other skin changes due to chronic exposure to nonionizing radiation: Secondary | ICD-10-CM | POA: Diagnosis not present

## 2021-12-26 DIAGNOSIS — D1800 Hemangioma unspecified site: Secondary | ICD-10-CM

## 2021-12-26 DIAGNOSIS — L814 Other melanin hyperpigmentation: Secondary | ICD-10-CM | POA: Diagnosis not present

## 2021-12-26 HISTORY — DX: Melanocytic nevi, unspecified: D22.9

## 2021-12-26 MED ORDER — METRONIDAZOLE 0.75 % EX CREA: TOPICAL_CREAM | CUTANEOUS | 3 refills | Status: DC

## 2021-12-26 NOTE — Patient Instructions (Addendum)
Cryotherapy Aftercare  Wash gently with soap and water everyday.   Apply Vaseline and Band-Aid daily until healed.   Wound Care Instructions  Cleanse wound gently with soap and water once a day then pat dry with clean gauze. Apply a thing coat of Petrolatum (petroleum jelly, "Vaseline") over the wound (unless you have an allergy to this). We recommend that you use a new, sterile tube of Vaseline. Do not pick or remove scabs. Do not remove the yellow or white "healing tissue" from the base of the wound.  Cover the wound with fresh, clean, nonstick gauze and secure with paper tape. You may use Band-Aids in place of gauze and tape if the would is small enough, but would recommend trimming much of the tape off as there is often too much. Sometimes Band-Aids can irritate the skin.  You should call the office for your biopsy report after 1 week if you have not already been contacted.  If you experience any problems, such as abnormal amounts of bleeding, swelling, significant bruising, significant pain, or evidence of infection, please call the office immediately.  FOR ADULT SURGERY PATIENTS: If you need something for pain relief you may take 1 extra strength Tylenol (acetaminophen) AND 2 Ibuprofen (200mg  each) together every 4 hours as needed for pain. (do not take these if you are allergic to them or if you have a reason you should not take them.) Typically, you may only need pain medication for 1 to 3 days.    Rosacea Start metronidazole 0.75% cream Apply to face every night for rosacea, increase to twice daily for flares.   What is rosacea? Rosacea (say: ro-zay-sha) is a common skin disease that usually begins as a trend of flushing or blushing easily.  As rosacea progresses, a persistent redness in the center of the face will develop and may gradually spread beyond the nose and cheeks to the forehead and chin.  In some cases, the ears, chest, and back could be affected.  Rosacea may appear as  tiny blood vessels or small red bumps that occur in crops.  Frequently they can contain pus, and are called pustules.  If the bumps do not contain pus, they are referred to as papules.  Rarely, in prolonged, untreated cases of rosacea, the oil glands of the nose and cheeks may become permanently enlarged.  This is called rhinophyma, and is seen more frequently in men.  Signs and Risks In its beginning stages, rosacea tends to come and go, which makes it difficult to recognize.  It can start as intermittent flushing of the face.  Eventually, blood vessels may become permanently visible.  Pustules and papules can appear, but can be mistaken for adult acne.  People of all races, ages, genders and ethnic groups are at risk of developing rosacea.  However, it is more common in women (especially around menopause) and adults with fair skin between the ages of 32 and 46.  Treatment Dermatologists typically recommend a combination of treatments to effectively manage rosacea.  Treatment can improve symptoms and may stop the progression of the rosacea.  Treatment may involve both topical and oral medications.  The tetracycline antibiotics are often used for their anti-inflammatory effect; however, because of the possibility of developing antibiotic resistance, they should not be used long term at full dose.  For dilated blood vessels the options include electrodessication (uses electric current through a small needle), laser treatment, and cosmetics to hide the redness.   With all forms of treatment,  improvement is a slow process, and patients may not see any results for the first 3-4 weeks.  It is very important to avoid the sun and other triggers.  Patients must wear sunscreen daily.  Skin Care Instructions: Cleanse the skin with a mild soap such as CeraVe cleanser, Cetaphil cleanser, or Dove soap once or twice daily as needed. Moisturize with Eucerin Redness Relief Daily Perfecting Lotion (has a subtle green  tint), CeraVe Moisturizing Cream, or Oil of Olay Daily Moisturizer with sunscreen every morning and/or night as recommended. Makeup should be non-comedogenic (wont clog pores) and be labeled for sensitive skin. Good choices for cosmetics are: Neutrogena, Almay, and Physicians Formula.  Any product with a green tint tends to offset a red complexion. If your eyes are dry and irritated, use artificial tears 2-3 times per day and cleanse the eyelids daily with baby shampoo.  Have your eyes examined at least every 2 years.  Be sure to tell your eye doctor that you have rosacea. Alcoholic beverages tend to cause flushing of the skin, and may make rosacea worse. Always wear sunscreen, protect your skin from extreme hot and cold temperatures, and avoid spicy foods, hot drinks, and mechanical irritation such as rubbing, scrubbing, or massaging the face.  Avoid harsh skin cleansers, cleansing masks, astringents, and exfoliation. If a particular product burns or makes your face feel tight, then it is likely to flare your rosacea. If you are having difficulty finding a sunscreen that you can tolerate, you may try switching to a chemical-free sunscreen.  These are ones whose active ingredient is zinc oxide or titanium dioxide only.  They should also be fragrance free, non-comedogenic, and labeled for sensitive skin. Rosacea triggers may vary from person to person.  There are a variety of foods that have been reported to trigger rosacea.  Some patients find that keeping a diary of what they were doing when they flared helps them avoid triggers.   If You Need Anything After Your Visit  If you have any questions or concerns for your doctor, please call our main line at [phone] and press option 4 to reach your doctor's medical assistant. If no one answers, please leave a voicemail as directed and we will return your call as soon as possible. Messages left after 4 pm will be answered the following business day.    You may also send [place] a message via [place]. We typically respond to MyChart messages within 1-2 business days.  For prescription refills, please ask your pharmacy to contact our office. Our fax number is [phone].  If you have an urgent issue when the clinic is closed that cannot wait until the next business day, you can page your doctor at the number below.    Please note that while we do our best to be available for urgent issues outside of office hours, we are not available 24/7.   If you have an urgent issue and are unable to reach [place], you may choose to seek medical care at your doctor's office, retail clinic, urgent care center, or emergency room.  If you have a medical emergency, please immediately call 911 or go to the emergency department.  Pager Numbers  - Dr. Nehemiah Massed: [phone]  - Dr. Laurence Ferrari: [phone]  - Dr. Nicole Kindred: [phone]  In the event of inclement weather, please call our main line at [phone] for an update on the status of any delays or closures.  Dermatology Medication Tips: Please keep the boxes that topical medications  come in in order to help keep track of the instructions about where and how to use these. Pharmacies typically print the medication instructions only on the boxes and not directly on the medication tubes.   If your medication is too expensive, please contact our office at [phone] option 4 or send [place] a message through [place].   We are unable to tell what your co-pay for medications will be in advance as this is different depending on your insurance coverage. However, we may be able to find a substitute medication at lower cost or fill out paperwork to get insurance to cover a needed medication.   If a prior authorization is required to get your medication covered by your insurance company, please allow [place] 1-2 business days to complete this process.  Drug prices often vary depending on where the prescription is filled and some  pharmacies may offer cheaper prices.  The website www.goodrx.com contains coupons for medications through different pharmacies. The prices here do not account for what the cost may be with help from insurance (it may be cheaper with your insurance), but the website can give you the price if you did not use any insurance.  - You can print the associated coupon and take it with your prescription to the pharmacy.  - You may also stop by our office during regular business hours and pick up a GoodRx coupon card.  - If you need your prescription sent electronically to a different pharmacy, notify our office through [facility] or by phone at [phone] option 4.     Si Usted Necesita Algo Despus de Su Visita  Tambin puede enviarnos un mensaje a travs de Pharmacist, community. Por lo general respondemos a los mensajes de MyChart en el transcurso de 1 a 2 das hbiles.  Para renovar recetas, por favor pida a su farmacia que se ponga en contacto con nuestra oficina. Harland Dingwall de fax es [place] [phone].  Si tiene un asunto urgente cuando la clnica est cerrada y que no puede esperar hasta el siguiente da hbil, puede llamar/localizar a su doctor(a) al nmero que aparece a continuacin.   Por favor, tenga en cuenta que aunque hacemos todo lo posible para estar disponibles para asuntos urgentes fuera del horario de [place], no estamos disponibles las 24 horas del da, los 7 das de la [place].   Si tiene un problema urgente y no puede comunicarse con nosotros, puede optar por buscar atencin mdica  en el consultorio de su doctor(a), en una clnica privada, en un centro de atencin urgente o en una sala de emergencias.  Si tiene Engineering geologist, por favor llame inmediatamente al 911 o vaya a la sala de emergencias.  Nmeros de bper  - Dr. Nehemiah Massed: [phone]  - Dra. Moye: [phone]  - Dra. Nicole Kindred: [phone]  En caso de inclemencias del [place], por favor llame a Johnsie Kindred  principal al [phone] para una actualizacin sobre el [place] de cualquier retraso o cierre.  Consejos para la medicacin en dermatologa: Por favor, guarde las cajas en las que vienen los medicamentos de uso tpico para ayudarle a seguir las instrucciones sobre dnde y cmo usarlos. Las farmacias generalmente imprimen las instrucciones del medicamento slo en las cajas y no directamente en los tubos del [place].   Si su medicamento es muy caro, por favor, pngase en contacto con Zigmund Daniel llamando al [phone] y presione la opcin 4 o envenos un mensaje a travs de Pharmacist, community.   No podemos decirle  cul ser su copago por los medicamentos por adelantado ya que esto es diferente dependiendo de la cobertura de su seguro. Sin embargo, es posible que podamos encontrar un medicamento sustituto a Electrical engineer un formulario para que el seguro cubra el medicamento que se considera necesario.   Si se requiere una autorizacin previa para que su compaa de seguros [place] su medicamento, por favor permtanos de 1 a 2 das hbiles para completar este proceso.  Los precios de los medicamentos varan con frecuencia dependiendo del Environmental consultant de dnde se surte la receta y alguna farmacias pueden ofrecer precios ms baratos.  El sitio web www.goodrx.com tiene cupones para medicamentos de Airline pilot. Los precios aqu no tienen en cuenta lo que podra costar con la ayuda del seguro (puede ser ms barato con su seguro), pero el sitio web puede darle el precio si no utiliz Research scientist (physical sciences).  - Puede imprimir el cupn correspondiente y llevarlo con su receta a la farmacia.  - Tambin puede pasar por nuestra oficina durante el horario de atencin regular y Charity fundraiser una tarjeta de cupones de GoodRx.  - Si necesita que su receta se enve electrnicamente a una farmacia diferente, informe a nuestra oficina a travs de MyChart de [place] o por telfono llamando al [phone] y presione la opcin  4.

## 2021-12-26 NOTE — Progress Notes (Signed)
Follow-Up Visit   Subjective  [name] [name] is a 66 y.o. female who presents for the following: Annual Exam.  Patient presents for TBSE. The patient has spots, moles and lesions to be evaluated, some may be new or changing. She has a couple of spots on her right temple area x 3 months that are itchy and irritated. No bleeding. She has an area on the left side of her mouth that is red and irritated. She uses Vaseline to help with drooling at night.  She has a history of BCC of the right lower pretibia. Hx of AKs.  The following portions of the chart were reviewed this encounter and updated as appropriate:       Review of Systems:  No other skin or systemic complaints except as noted in HPI or Assessment and Plan.  Objective  Well appearing patient in no apparent distress; mood and affect are within normal limits.  A full examination was performed including scalp, head, eyes, ears, nose, lips, neck, chest, axillae, abdomen, back, buttocks, bilateral upper extremities, bilateral lower extremities, hands, feet, fingers, toes, fingernails, and toenails. All findings within normal limits unless otherwise noted below.  R upper temple x 1, R zygoma x 1, R nasal root adjacent to nevus x 1, L medial breast x 1 (4) Pink scaly macule.  face Erythema with telangiectasias of the malar cheeks, nose, perioral.  spinal mid upper back 5 x 3 mm medium dark brown macule     mid sternum 4.0 mm pink flesh papule    Assessment & Plan  Skin cancer screening performed today.  Actinic Damage - chronic, secondary to cumulative UV radiation exposure/sun exposure over time - diffuse scaly erythematous macules with underlying dyspigmentation - Recommend daily broad spectrum sunscreen SPF 30+ to sun-exposed areas, reapply every 2 hours as needed.  - Recommend staying in the shade or wearing long sleeves, sun glasses (UVA+UVB protection) and wide brim hats (4-inch brim around the entire circumference  of the hat). - Call for new or changing lesions.  Lentigines - Scattered tan macules - Due to sun exposure - Benign-appering, observe - Recommend daily broad spectrum sunscreen SPF 30+ to sun-exposed areas, reapply every 2 hours as needed. - Call for any changes  Seborrheic Keratoses - Stuck-on, waxy, tan-brown papules and/or plaques  - Benign-appearing - Discussed benign etiology and prognosis. - Observe - Call for any changes  History of Basal Cell Carcinoma of the Skin - No evidence of recurrence today of the right lower pretibia - Recommend regular full body skin exams - Recommend daily broad spectrum sunscreen SPF 30+ to sun-exposed areas, reapply every 2 hours as needed.  - Call if any new or changing lesions are noted between office visits  Purpura - Chronic; persistent and recurrent.  Treatable, but not curable. - Violaceous macules and patches - Benign - Related to trauma, age, sun damage and/or use of blood thinners, chronic use of topical and/or oral steroids - Observe - Can use OTC arnica containing moisturizer such as Dermend Bruise Formula if desired - Call for worsening or other concerns  AK (actinic keratosis) (4) R upper temple x 1, R zygoma x 1, R nasal root adjacent to nevus x 1, L medial breast x 1  Actinic keratoses are precancerous spots that appear secondary to cumulative UV radiation exposure/sun exposure over time. They are chronic with expected duration over 1 year. A portion of actinic keratoses will progress to squamous cell carcinoma of the skin. It  is not possible to reliably predict which spots will progress to skin cancer and so treatment is recommended to prevent development of skin cancer.  Recommend daily broad spectrum sunscreen SPF 30+ to sun-exposed areas, reapply every 2 hours as needed.  Recommend staying in the shade or wearing long sleeves, sun glasses (UVA+UVB protection) and wide brim hats (4-inch brim around the entire circumference of  the hat). Call for new or changing lesions.  Destruction of lesion - R upper temple x 1, R zygoma x 1, R nasal root adjacent to nevus x 1, L medial breast x 1  Destruction method: cryotherapy   Informed consent: discussed and consent obtained   Lesion destroyed using liquid nitrogen: Yes   Region frozen until ice ball extended beyond lesion: Yes   Outcome: patient tolerated procedure well with no complications   Post-procedure details: wound care instructions given   Additional details:  Prior to procedure, discussed risks of blister formation, small wound, skin dyspigmentation, or rare scar following cryotherapy. Recommend Vaseline ointment to treated areas while healing.   Rosacea face  Rosacea is a chronic progressive skin condition usually affecting the face of adults, causing redness and/or acne bumps. It is treatable but not curable. It sometimes affects the eyes (ocular rosacea) as well. It may respond to topical and/or systemic medication and can flare with stress, sun exposure, alcohol, exercise and some foods.  Daily application of broad spectrum spf 30+ sunscreen to face is recommended to reduce flares.  Start metrocream qd/bid to face  metroNIDAZOLE (METROCREAM) 0.75 % cream - face Apply to affected areas face once to twice daily.  Neoplasm of uncertain behavior of skin spinal mid upper back  Epidermal / dermal shaving  Lesion diameter (cm):  0.6 Informed consent: discussed and consent obtained   Patient was prepped and draped in usual sterile fashion: Area prepped with alcohol. Anesthesia: the lesion was anesthetized in a standard fashion   Anesthetic:  1% lidocaine w/ epinephrine 1-100,000 buffered w/ 8.4% NaHCO3 Instrument used: flexible razor blade   Hemostasis achieved with: pressure, aluminum chloride and electrodesiccation   Outcome: patient tolerated procedure well   Post-procedure details: wound care instructions given   Post-procedure details comment:   Ointment and small bandage applied  Specimen 1 - Surgical pathology Differential Diagnosis: Nevus r/o Dysplasia Check Margins: Yes 5 x 3 mm medium dark brown macule  Nevus mid sternum  Benign-appearing.  Observation.  Call clinic for new or changing moles.  Recommend daily use of broad spectrum spf 30+ sunscreen to sun-exposed areas.   Hemangiomas - Red papules - Discussed benign nature - Observe - Call for any changes  Return in about 3 months (around 03/26/2022) for AKs, rosacea.  IJamesetta Orleans, CMA, am acting as scribe for Brendolyn Patty, MD .  Documentation: I have reviewed the above documentation for accuracy and completeness, and I agree with the above.  Brendolyn Patty MD

## 2021-12-27 DIAGNOSIS — S52501D Unspecified fracture of the lower end of right radius, subsequent encounter for closed fracture with routine healing: Secondary | ICD-10-CM | POA: Diagnosis not present

## 2021-12-27 DIAGNOSIS — S63501A Unspecified sprain of right wrist, initial encounter: Secondary | ICD-10-CM | POA: Diagnosis not present

## 2022-01-01 ENCOUNTER — Telehealth: Payer: Self-pay

## 2022-01-01 NOTE — Telephone Encounter (Signed)
Advised pt of bx results and scheduled pt for surgery./sh 

## 2022-01-01 NOTE — Telephone Encounter (Signed)
-----   Message from Brendolyn Patty, MD sent at 12/28/2021  4:26 PM EST ----- Skin , spinal mid upper back [place], PERIPHERAL MARGIN INVOLVED  Severely atypical mole, needs excision - please call patient

## 2022-01-02 ENCOUNTER — Telehealth: Payer: Self-pay | Admitting: Family Medicine

## 2022-01-02 DIAGNOSIS — E2839 Other primary ovarian failure: Secondary | ICD-10-CM | POA: Insufficient documentation

## 2022-01-02 NOTE — Telephone Encounter (Signed)
Pt called asking if she could have a referral to a bone density in [place]. Please advise.

## 2022-01-02 NOTE — Telephone Encounter (Signed)
I ordered for [name] [name]   She can call to schedule/please give her the #  She will need to see if her insurance covers it also (some will not before age 64)

## 2022-01-02 NOTE — Telephone Encounter (Signed)
Pt notified order in and will check with insurance to make sure it's covered before scheduling appt

## 2022-01-15 ENCOUNTER — Other Ambulatory Visit: Payer: Self-pay

## 2022-01-15 ENCOUNTER — Encounter: Payer: Self-pay | Admitting: Dermatology

## 2022-01-15 ENCOUNTER — Ambulatory Visit: Payer: Medicare HMO | Admitting: Dermatology

## 2022-01-15 DIAGNOSIS — D225 Melanocytic nevi of trunk: Secondary | ICD-10-CM | POA: Diagnosis not present

## 2022-01-15 DIAGNOSIS — L988 Other specified disorders of the skin and subcutaneous tissue: Secondary | ICD-10-CM | POA: Diagnosis not present

## 2022-01-15 DIAGNOSIS — D485 Neoplasm of uncertain behavior of skin: Secondary | ICD-10-CM

## 2022-01-15 MED ORDER — MUPIROCIN 2 % EX OINT: TOPICAL_OINTMENT | CUTANEOUS | 0 refills | Status: DC

## 2022-01-15 NOTE — Patient Instructions (Addendum)

## 2022-01-15 NOTE — Progress Notes (Signed)
° °  Follow-Up Visit   Subjective  [name] [name] is a 66 y.o. female who presents for the following: Procedure (Patient here today for excision of bx proven DYSPLASTIC Stamps at spinal mid upper back.).   The following portions of the chart were reviewed this encounter and updated as appropriate:       Review of Systems:  No other skin or systemic complaints except as noted in HPI or Assessment and Plan.  Objective  Well appearing patient in no apparent distress; mood and affect are within normal limits.  A focused examination was performed including back. Relevant physical exam findings are noted in the Assessment and Plan.  Spinal Mid Upper Back Pink biopsy site    Assessment & Plan  Neoplasm of uncertain behavior of skin Spinal Mid Upper Back  Skin excision  Lesion length (cm):  0.7 Lesion width (cm):  0.7 Margin per side (cm):  0.2 Total excision diameter (cm):  1.1 Informed consent: discussed and consent obtained   Timeout: patient name, date of birth, surgical site, and procedure verified   Procedure prep:  Patient was prepped and draped in usual sterile fashion Prep type:  Povidone-iodine Anesthesia: the lesion was anesthetized in a standard fashion   Local anesthetic: 15 cc 0.25 % bupivicaine. Instrument used: #15 blade   Hemostasis achieved with: pressure and electrodesiccation   Outcome: patient tolerated procedure well with no complications   Additional details:  Superior 12:00 tag  Skin repair Complexity:  Intermediate Final length (cm):  2.7 Informed consent: discussed and consent obtained   Timeout: patient name, date of birth, surgical site, and procedure verified   Reason for type of repair: reduce tension to allow closure, reduce the risk of dehiscence, infection, and necrosis, reduce subcutaneous dead space and avoid a hematoma, preserve normal anatomical and functional relationships and enhance both functionality and  cosmetic results   Undermining: edges undermined   Subcutaneous layers (deep stitches):  Suture size:  3-0 Suture type: Vicryl (polyglactin 910)   Stitches:  Buried vertical mattress Fine/surface layer approximation (top stitches):  Suture size:  4-0 Suture type: nylon   Stitches: simple interrupted   Suture removal (days):  7 Hemostasis achieved with: suture, pressure and electrodesiccation Outcome: patient tolerated procedure well with no complications   Post-procedure details: sterile dressing applied and wound care instructions given   Dressing type: pressure dressing (Mupirocin ointment)   Additional details:  Mupirocin and a pressure dressing applied  mupirocin ointment (BACTROBAN) 2 % Apply to affected area once daily with dressing change.  Specimen 1 - Surgical pathology Differential Diagnosis: bx proven DYSPLASTIC JUNCTIONAL NEVUS WITH SEVERE ATYPIA Check Margins: yes Pink biopsy site [id number] *Superior 12:00 tag  Start mupirocin 2% ointment Apply QD with bandage change.   Return in about 1 week (around 01/22/2022) for Suture Removal.  [name], RMA, am acting as scribe for Brendolyn Patty, MD .  Documentation: I have reviewed the above documentation for accuracy and completeness, and I agree with the above.  Brendolyn Patty MD

## 2022-01-16 ENCOUNTER — Telehealth: Payer: Self-pay

## 2022-01-16 NOTE — Telephone Encounter (Signed)
Patient doing good after yesterday's surgery. Lurlean Horns, RMA

## 2022-01-17 DIAGNOSIS — S52501D Unspecified fracture of the lower end of right radius, subsequent encounter for closed fracture with routine healing: Secondary | ICD-10-CM | POA: Diagnosis not present

## 2022-01-22 ENCOUNTER — Other Ambulatory Visit: Payer: Self-pay

## 2022-01-22 ENCOUNTER — Ambulatory Visit (INDEPENDENT_AMBULATORY_CARE_PROVIDER_SITE_OTHER): Payer: Medicare HMO | Admitting: Dermatology

## 2022-01-22 DIAGNOSIS — Z86018 Personal history of other benign neoplasm: Secondary | ICD-10-CM

## 2022-01-22 DIAGNOSIS — Q828 Other specified congenital malformations of skin: Secondary | ICD-10-CM | POA: Diagnosis not present

## 2022-01-22 NOTE — Progress Notes (Signed)
° °  Follow-Up Visit   Subjective  [name] [name] is a 66 y.o. female who presents for the following: Follow-up (Patient here today for 1 week suture removal. ).  She also has a spot on her leg that she wants checked.   The following portions of the chart were reviewed this encounter and updated as appropriate:       Review of Systems:  No other skin or systemic complaints except as noted in HPI or Assessment and Plan.  Objective  Well appearing patient in no apparent distress; mood and affect are within normal limits.  A focused examination was performed including back, right leg. Relevant physical exam findings are noted in the Assessment and Plan.  Right Lower pretibia Light pink waxy macule with keratotic rim    Assessment & Plan  Porokeratosis Right Lower pretibia  Benign-appearing.  Observation.  Call clinic for new or changing lesions.  Recommend daily use of broad spectrum spf 30+ sunscreen to sun-exposed areas.    Dysplastic Nevus, severe Encounter for Removal of Sutures - Incision site at the spinal mid upper back is clean, dry and intact - Wound cleansed, sutures removed, wound cleansed and steri strips applied.  - Discussed pathology results showing margins free  - Patient advised to keep steri-strips dry until they fall off. - Scars remodel for a full year. - Once steri-strips fall off, patient can apply over-the-counter silicone scar cream each night to help with scar remodeling if desired. - Patient advised to call with any concerns or if they notice any new or changing lesions.  Return for as scheduled.  [name], RMA, am acting as scribe for Brendolyn Patty, MD .  Documentation: I have reviewed the above documentation for accuracy and completeness, and I agree with the above.  Brendolyn Patty MD

## 2022-01-22 NOTE — Patient Instructions (Signed)

## 2022-01-23 ENCOUNTER — Ambulatory Visit (INDEPENDENT_AMBULATORY_CARE_PROVIDER_SITE_OTHER): Payer: Medicare HMO | Admitting: Psychology

## 2022-01-23 DIAGNOSIS — F3181 Bipolar II disorder: Secondary | ICD-10-CM

## 2022-01-23 NOTE — Progress Notes (Signed)
[facility] Counselor/Therapist Progress Note  Patient ID: ELEAH LAHAIE, MRN: [id number],    Date: 01/23/2022  Time Spent: 50 minutes  Treatment Type: Individual Therapy  Reported Symptoms: none  Mental Status Exam: Appearance:  Casual     Behavior: Appropriate  Motor: Normal  Speech/Language:  Normal Rate  Affect: Appropriate  Mood: normal  Thought process: normal  Thought content:   WNL  Sensory/Perceptual disturbances:   WNL  Orientation: oriented to person, place, time/date, and situation  Attention: Good  Concentration: Good  Memory: WNL  Fund of knowledge:  Good  Insight:   Good  Judgment:  Good  Impulse Control: Good   Risk Assessment: Danger to Self:  No Self-injurious Behavior: No Danger to Others: No Duty to Warn:no Physical Aggression / Violence:No  Access to Firearms a concern: No  Gang Involvement:No   Subjective: The patient attended a face-to-face individual therapy session via video visit today.  The patient agreed and consented for this session to be on caregility.  The patient was in her home alone and the therapist was in the office.  The patient presents as somewhat anxious today. She states that it has been a difficult month since she was last seen.  The patient states that she fell and broke two bones in her hand.  She also states that she had to have a place removed from her back recently as well.  In addition she says that she has not been able to exercise like she normally would because of her health issues.  We discussed getting back on some exercise schedule when she recovers.  She also talked about some things that they are going to be doing and we talked about her using her relaxation techniques to help her deal with her anxiety over these. Interventions: Cognitive Behavioral Therapy  Diagnosis:Bipolar II disorder ([place])  Plan: Please see treatment plan in Therapy Charts with target date of 05/07/2022 for goals and progress  towards goals.  The patient approved this treatment plan and is making progress towards goals.  Lashundra Shiveley G Jaley Yan, LCSW                  Linnea Todisco G Ellice Boultinghouse, LCSW

## 2022-02-16 ENCOUNTER — Ambulatory Visit: Payer: Medicare HMO | Admitting: Obstetrics and Gynecology

## 2022-02-19 ENCOUNTER — Telehealth (INDEPENDENT_AMBULATORY_CARE_PROVIDER_SITE_OTHER): Payer: Medicare HMO | Admitting: Adult Health

## 2022-02-19 DIAGNOSIS — U071 COVID-19: Secondary | ICD-10-CM | POA: Insufficient documentation

## 2022-02-19 DIAGNOSIS — R944 Abnormal results of kidney function studies: Secondary | ICD-10-CM | POA: Diagnosis not present

## 2022-02-19 DIAGNOSIS — J069 Acute upper respiratory infection, unspecified: Secondary | ICD-10-CM | POA: Diagnosis not present

## 2022-02-19 DIAGNOSIS — R5081 Fever presenting with conditions classified elsewhere: Secondary | ICD-10-CM | POA: Diagnosis not present

## 2022-02-19 MED ORDER — MOLNUPIRAVIR EUA 200MG CAPSULE
4.0000 | ORAL_CAPSULE | Freq: Two times a day (BID) | ORAL | 0 refills | Status: AC
Start: 2022-02-19 — End: 2022-02-24

## 2022-02-19 NOTE — Patient Instructions (Signed)
Upper Respiratory Infection, Adult An upper respiratory infection (URI) affects the nose, throat, and upper airways that lead to the lungs. The most common type of URI is often called the common cold. URIs usually get better on their own, without medical treatment. What are the causes? A URI is caused by a germ (virus). You may catch these germs by: Breathing in droplets from an infected person's cough or sneeze. Touching something that has the germ on it (is contaminated) and then touching your mouth, nose, or eyes. What increases the risk? You are more likely to get a URI if: You are very young or very old. You have close contact with others, such as at work, school, or a health care facility. You smoke. You have long-term (chronic) heart or lung disease. You have a weakened disease-fighting system (immune system). You have nasal allergies or asthma. You have a lot of stress. You have poor nutrition. What are the signs or symptoms? Runny or stuffy (congested) nose. Cough. Sneezing. Sore throat. Headache. Feeling tired (fatigue). Fever. Not wanting to eat as much as usual. Pain in your forehead, behind your eyes, and over your cheekbones (sinus pain). Muscle aches. Redness or irritation of the eyes. Pressure in the ears or face. How is this treated? URIs usually get better on their own within 7-10 days. Medicines cannot cure URIs, but your doctor may recommend certain medicines to help relieve symptoms, such as: Over-the-counter cold medicines. Medicines to reduce coughing (cough suppressants). Coughing is a type of defense against infection that helps to clear the nose, throat, windpipe, and lungs (respiratory system). Take these medicines only as told by your doctor. Medicines to lower your fever. Follow these instructions at home: Activity Rest as needed. If you have a fever, stay home from work or school until your fever is gone, or until your doctor says you may return to  work or school. You should stay home until you cannot spread the infection anymore (you are not contagious). Your doctor may have you wear a face mask so you have less risk of spreading the infection. Relieving symptoms Rinse your mouth often with salt water. To make salt water, dissolve -1 tsp (3-6 g) of salt in 1 cup (237 mL) of warm water. Use a cool-mist humidifier to add moisture to the air. This can help you breathe more easily. Eating and drinking  Drink enough fluid to keep your pee (urine) pale yellow. Eat soups and other clear broths. General instructions  Take over-the-counter and prescription medicines only as told by your doctor. Do not smoke or use any products that contain nicotine or tobacco. If you need help quitting, ask your doctor. Avoid being where people are smoking (avoid secondhand smoke). Stay up to date on all your shots (immunizations), and get the flu shot every year. Keep all follow-up visits. How to prevent the spread of infection to others  Wash your hands with soap and water for at least 20 seconds. If you cannot use soap and water, use hand sanitizer. Avoid touching your mouth, face, eyes, or nose. Cough or sneeze into a tissue or your sleeve or elbow. Do not cough or sneeze into your hand or into the air. Contact a doctor if: You are getting worse, not better. You have any of these: A fever or chills. Brown or red mucus in your nose. Yellow or brown fluid (discharge)coming from your nose. Pain in your face, especially when you bend forward. Swollen neck glands. Pain when you swallow.  White areas in the back of your throat. Get help right away if: You have shortness of breath that gets worse. You have very bad or constant: Headache. Ear pain. Pain in your forehead, behind your eyes, and over your cheekbones (sinus pain). Chest pain. You have long-lasting (chronic) lung disease along with any of these: Making high-pitched whistling sounds when  you breathe, most often when you breathe out (wheezing). Long-lasting cough (more than 14 days). Coughing up blood. A change in your usual mucus. You have a stiff neck. You have changes in your: Vision. Hearing. Thinking. Mood. These symptoms may be an emergency. Get help right away. Call 911. Do not wait to see if the symptoms will go away. Do not drive yourself to the hospital. Summary An upper respiratory infection (URI) is caused by a germ (virus). The most common type of URI is often called the common cold. URIs usually get better within 7-10 days. Take over-the-counter and prescription medicines only as told by your doctor. This information is not intended to replace advice given to you by your health care provider. Make sure you discuss any questions you have with your health care provider. Document Revised: 06/28/2021 Document Reviewed: 06/28/2021 Elsevier Patient Education  [place] Can Do to Manage Your COVID-19 Symptoms at Home If you have possible or confirmed COVID-19 Stay home except to get medical care. Monitor your symptoms carefully. If your symptoms get worse, call your healthcare provider immediately. Get rest and stay hydrated. If you have a medical appointment, call the healthcare provider ahead of time and tell them that you have or may have COVID-19. For medical emergencies, call 911 and notify the dispatch personnel that you have or may have COVID-19. Cover your cough and sneezes with a tissue or use the inside of your elbow. Wash your hands often with soap and water for at least 20 seconds or clean your hands with an alcohol-based hand sanitizer that contains at least 60% alcohol. As much as possible, stay in a specific room and away from other people in your home. Also, you should use a separate bathroom, if available. If you need to be around other people in or outside of the home, wear a mask. Avoid sharing personal items with other  people in your household, like dishes, towels, and bedding. Clean all surfaces that are touched often, like counters, tabletops, and doorknobs. Use household cleaning sprays or wipes according to the label instructions. [email or web address] 06/24/2020 This information is not intended to replace advice given to you by your health care provider. Make sure you discuss any questions you have with your health care provider. Document Revised: 08/18/2021 Document Reviewed: 08/18/2021 Elsevier Patient Education  2022 [place] Oral Capsules What is this medication? MOLNUPIRAVIR (mol nue pir a vir) treats COVID-19. It is an antiviral medication. It may decrease the risk of developing severe symptoms of COVID-19. It may also decrease the chance of going to the hospital. This medication is not approved by the FDA. The FDA has authorized emergency use of this medication during the COVID-19 pandemic. This medicine may be used for other purposes; ask your health care provider or pharmacist if you have questions. COMMON BRAND NAME(S): LAGEVRIO What should I tell my care team before I take this medication? They need to know if you have any of these conditions: Any allergies Any serious illness An unusual or allergic reaction to molnupiravir, other medications, foods, dyes, or preservatives Pregnant or trying to get  pregnant Breast-feeding How should I use this medication? Take this medication by mouth with water. Take it as directed on the prescription label at the same time every day. Do not cut, crush or chew this medication. Swallow the capsules whole. You can take it with or without food. If it upsets your stomach, take it with food. Take all of this medication unless your care team tells you to stop it early. Keep taking it even if you think you are better. Talk to your care team about the use of this medication in children. Special care may be needed. Overdosage: If you think you have  taken too much of this medicine contact a poison control center or emergency room at once. NOTE: This medicine is only for you. Do not share this medicine with others. What if I miss a dose? If you miss a dose, take it as soon as you can unless it is more than 10 hours late. If it is more than 10 hours late, skip the missed dose. Take the next dose at the normal time. Do not take extra or 2 doses at the same time to make up for the missed dose. What may interact with this medication? Interactions have not been studied. This list may not describe all possible interactions. Give your health care provider a list of all the medicines, herbs, non-prescription drugs, or dietary supplements you use. Also tell them if you smoke, drink alcohol, or use illegal drugs. Some items may interact with your medicine. What should I watch for while using this medication? Your condition will be monitored carefully while you are receiving this medication. Visit your care team for regular checkups. Tell your care team if your symptoms do not start to get better or if they get worse. Do not become pregnant while taking this medication. You may need a pregnancy test before starting this medication. Women must use a reliable form of birth control while taking this medication and for 4 days after stopping the medication. Women should inform their care team if they wish to become pregnant or think they might be pregnant. Men should not father a child while taking this medication and for 3 months after stopping it. There is potential for serious harm to an unborn child. Talk to your care team for more information. Do not breast-feed an infant while taking this medication and for 4 days after stopping the medication. What side effects may I notice from receiving this medication? Side effects that you should report to your care team as soon as possible: Allergic reactions--skin rash, itching, hives, swelling of the face, lips,  tongue, or throat Side effects that usually do not require medical attention (report these to your care team if they continue or are bothersome): Diarrhea Dizziness Nausea This list may not describe all possible side effects. Call your doctor for medical advice about side effects. You may report side effects to FDA at 1-800-FDA-1088. Where should I keep my medication? Keep out of the reach of children and pets. Store at room temperature between 20 and 25 degrees C (68 and 77 degrees F). Get rid of any unused medication after the expiration date. To get rid of medications that are no longer needed or have expired: Take the medication to a medication take-back program. Check with your pharmacy or law enforcement to find a location. If you cannot return the medication, check the label or package insert to see if the medication should be thrown out in the garbage or  flushed down the toilet. If you are not sure, ask your care team. If it is safe to put it in the trash, take the medication out of the container. Mix the medication with cat litter, dirt, coffee grounds, or other unwanted substance. Seal the mixture in a bag or container. Put it in the trash. NOTE: This sheet is a summary. It may not cover all possible information. If you have questions about this medicine, talk to your doctor, pharmacist, or health care provider.  2022 Elsevier/Gold Standard (2020-12-05 00:00:00)

## 2022-02-19 NOTE — Progress Notes (Unsigned)
Virtual Visit via Video Note  I connected with [name] [name] on 02/19/22 at  4:00 PM EDT by a video enabled telemedicine application and verified that I am speaking with the correct person using two identifiers.  Location: Patient: at home  Provider: Provider: Provider's office at  [facility], [place].   I discussed the limitations of evaluation and management by telemedicine and the availability of in person appointments. The patient expressed understanding and agreed to proceed.  History of Present Illness: Patient is a 66 year old female in no acute distress. She woke up yesterday and had body aches started on 02/18/2022 yesterday.  Tested positive on yesterday as well. Yesterday she started with fever T- max 101.  She has not taken her temperature today she felt she had a fever this morning but not now. She has been taking tylenol today.  She has no equipment to check oxygen saturations or blood pressure. Increased sinus congestion as well.  She denies any acute distress or shortness of breath..  She was [name].  Denies any wheezing. Cough. Productive occasional phlegm.  History of pneumonia 20 years ago treated as out patient.   Patient  denies any fever, body aches,chills, rash, chest pain, shortness of breath, nausea, vomiting, or diarrhea.  Denies dizziness, lightheadedness, pre syncopal or syncopal episodes.     Observations/Objective:   Patient is alert and oriented and responsive to questions Engages in conversation with provider. Speaks in full sentences without any pauses without any shortness of breath or distress.   Assessment and Plan:   Follow Up Instructions:     Advised in person evaluation at anytime is advised if any symptoms do not improve, worsen or change at any given time.  Red Flags discussed. The patient was given clear instructions to go to ER or return to medical center if any red flags develop, symptoms do  not improve, worsen or new problems develop. They verbalized understanding.  I discussed the assessment and treatment plan with the patient. The patient was provided an opportunity to ask questions and all were answered. The patient agreed with the plan and demonstrated an understanding of the instructions.   The patient was advised to call back or seek an in-person evaluation if the symptoms worsen or if the condition fails to improve as anticipated.     [name] Buffy, FNP

## 2022-02-22 ENCOUNTER — Telehealth: Payer: Self-pay | Admitting: Family Medicine

## 2022-02-22 ENCOUNTER — Ambulatory Visit: Payer: Medicare HMO | Admitting: Obstetrics and Gynecology

## 2022-02-22 NOTE — Telephone Encounter (Signed)
Pt called stating that she tested positive for covid on 12/21/21. Pt states that [facility] put her on medication Molnupiravir and now she has a rash on her chest under her breast and her head is itching. Pt is asking should she still stay on medication. Please advise. ?

## 2022-02-22 NOTE — Telephone Encounter (Signed)
Patient advised and verbalized understanding 

## 2022-02-22 NOTE — Telephone Encounter (Signed)
I cannot tell her for sure whether this is from the medicine or not.  Go ahead and stop it. Watch for s/s of anaphylaxis (sob, mouth or throat swelling and please review ER precautions.  ?Antihistamine for itch  ?F/u for visit if needed  ?Let us know if covid symptoms do not continue to improve ?

## 2022-02-26 ENCOUNTER — Telehealth: Payer: Self-pay

## 2022-02-26 MED ORDER — LOVASTATIN 40 MG PO TABS: 40.0000 mg | ORAL_TABLET | Freq: Every day | ORAL | 0 refills | Status: DC

## 2022-02-26 MED ORDER — LEVOTHYROXINE SODIUM 50 MCG PO TABS: 50.0000 ug | ORAL_TABLET | Freq: Every day | ORAL | 0 refills | Status: DC

## 2022-02-26 NOTE — Telephone Encounter (Signed)
Patient called [facility] has been trying to contact our office patient is due for refill on Levothyroxine and lovastatin.  ? ?If issues please call  ?[phone] ?

## 2022-03-06 ENCOUNTER — Ambulatory Visit (INDEPENDENT_AMBULATORY_CARE_PROVIDER_SITE_OTHER): Payer: Medicare HMO | Admitting: Psychology

## 2022-03-06 DIAGNOSIS — F3181 Bipolar II disorder: Secondary | ICD-10-CM | POA: Diagnosis not present

## 2022-03-06 NOTE — Progress Notes (Signed)
[facility] Counselor/Therapist Progress Note ? ?Patient ID: ARBUTUS NELLIGAN, MRN: [id number],   ? ?Date: 03/06/2022 ? ?Time Spent: 50 minutes ? ?Treatment Type: Individual Therapy ? ?Reported Symptoms: none ? ?Mental Status Exam: ?Appearance:  Casual     ?Behavior: Appropriate  ?Motor: Normal  ?Speech/Language:  Normal Rate  ?Affect: Appropriate  ?Mood: normal  ?Thought process: normal  ?Thought content:   WNL  ?Sensory/Perceptual disturbances:   WNL  ?Orientation: oriented to person, place, time/date, and situation  ?Attention: Good  ?Concentration: Good  ?Memory: WNL  ?Fund of knowledge:  Good  ?Insight:   Good  ?Judgment:  Good  ?Impulse Control: Good  ? ?Risk Assessment: ?Danger to Self:  No ?Self-injurious Behavior: No ?Danger to Others: No ?Duty to Warn:no ?Physical Aggression / Violence:No  ?Access to Firearms a concern: No  ?Gang Involvement:No  ? ?Subjective: The patient attended a face-to-face individual therapy session via video visit today.  The patient agreed and consented for this session to be on caregility.  The patient was in her home alone and the therapist was in the office.  The patient presents as pleasant and cooperative.  The patient reports that things have been going well.  The patient states that she went to [place] to take care of her grandchildren and she also went down to [place] to see her sister.  The patient states that she feels like her medication is stable and her mood is stable.  The patient states that she feels like she has been stable for quite some time but does feel like it is helpful to check in periodically and get some feedback.  The patient seems to be managing her symptoms well. ? ?Interventions: Cognitive Behavioral Therapy ? ?Diagnosis:Bipolar II disorder ([place]) ? ?Plan: Please see treatment plan in Therapy Charts with target date of 05/07/2022 for goals and progress towards goals.  The patient approved this treatment plan and is making progress towards  goals. ? ?Geovana Gebel G Joyce Heitman, LCSW ? ? ? ? ? ? ? ? ? ? ? ? ? ? ? ? ? ?Mailen Newborn G Embry Huss, LCSW ? ? ? ? ? ? ? ? ? ? ? ? ? ? ?Kirbi Farrugia G Meric Joye, LCSW ?

## 2022-03-21 ENCOUNTER — Ambulatory Visit: Payer: Medicare HMO | Admitting: Obstetrics and Gynecology

## 2022-03-21 ENCOUNTER — Encounter: Payer: Self-pay | Admitting: Obstetrics and Gynecology

## 2022-03-21 VITALS — BP 120/80 | Ht 60.5 in | Wt 218.0 lb

## 2022-03-21 DIAGNOSIS — N9089 Other specified noninflammatory disorders of vulva and perineum: Secondary | ICD-10-CM | POA: Diagnosis not present

## 2022-03-21 NOTE — Progress Notes (Signed)
? ?Patient ID: [name] [name], female   DOB: 02-26-1956, 66 y.o.   MRN: [id number] ? ?Reason for Consult: Follow-up ?  ?Referred by Abner Greenspan, MD ? ?Subjective:  ?   ?HPI: ? ?[name] [name] is a 66 y.o. female. She is following up today for a monitoring of melanotic change to labia minora.  ? ?Gynecological History ? ?No LMP recorded. Patient is postmenopausal. ? ?Past Medical History:  ?Diagnosis Date  ? Actinic keratosis 03/29/2021  ? R central upper lip  ? Allergic rhinitis   ? Atypical mole 12/26/2021  ? spinal upper back, exc 01/15/22  ? Basal cell carcinoma 10/16/2010  ? R lower pretibia   ? Bipolar 1 disorder ([place])   ? Fissure, anal   ? GERD (gastroesophageal reflux disease)   ? History of basal cell carcinoma excision   ? ON FACE  ? History of squamous cell carcinoma excision   ? ON RIGHT LEG  ? Hyperlipidemia   ? Hypothyroidism   ? IBS (irritable bowel syndrome)   ? Liver cyst   ? Mallet finger of left hand   ? small finger  ? ?Family History  ?Problem Relation Age of Onset  ? Stroke Mother   ? Heart disease Mother   ? Hypertension Father   ? Diabetes Father   ? Hyperlipidemia Father   ? Diabetes Brother   ? ?Past Surgical History:  ?Procedure Laterality Date  ? CARDIAC CATHETERIZATION  04/1999  ? per pt normal  ? CARDIOVASCULAR STRESS TEST  02-23-2011  dr Bartholome Bill [facility])  ? normal nuclear study/  no ischemia/  ef 64%  ? CATARACT EXTRACTION W/ INTRAOCULAR LENS  IMPLANT, BILATERAL  2003  ? Bil  ? [place]  ? CLOSED REDUCTION FINGER WITH PERCUTANEOUS PINNING Left 04/22/2014  ? Procedure: LEFT SMALL FINGER CLOSED REDUCTION PINNING;  Surgeon: Linna Hoff, MD;  Location: [facility];  Service: Orthopedics;  Laterality: Left;  ? COLONOSCOPY  11/2002  ? LAPAROSCOPIC CHOLECYSTECTOMY  03-18-2000  ? TRANSTHORACIC ECHOCARDIOGRAM  02-23-2011  ? normal lvf/  ef 58%/  mild rve/   mild lae/  mild  mr  & tr/  mild pulmonary htn  ? WISDOM TOOTH EXTRACTION    ? ? ?Short  Social History:  ?Social History  ? ?Tobacco Use  ? Smoking status: Never  ? Smokeless tobacco: Never  ?Substance Use Topics  ? Alcohol use: Yes  ?  Alcohol/week: 0.0 standard drinks  ?  Comment: wine- rarely  ? ? ?Allergies  ?Allergen Reactions  ? Lidocaine Swelling  ?  REACTION: swelling ?ALL CAINE DRUGS EXCEPT: marcaine & sensercaine ?Other reaction(s): Edema  ? Xylocaine [Lidocaine Hcl] Swelling  ?  ALLERGIC TO ALL CAINES EXCEPT SENSORCAINE , AND MARCAINE  ? Amantadine   ? Codeine Nausea And Vomiting  ?  REACTION: vomits blood  ? Lithium Swelling  ? Tegretol [Carbamazepine] Other (See Comments)  ?  REACTION: skin crawls  ? ? ?Current Outpatient Medications  ?Medication Sig Dispense Refill  ? ARIPiprazole (ABILIFY) 10 MG tablet Take 10 mg by mouth daily.    ? aspirin 81 MG tablet Take 81 mg by mouth daily.    ? BIOTIN PO Take 1 tablet by mouth daily.    ? buPROPion (WELLBUTRIN XL) 150 MG 24 hr tablet Take three tablets by mouth every morning    ? cetirizine (ZYRTEC) 10 MG tablet Take 10 mg by mouth daily as needed for allergies.    ?  Cholecalciferol (VITAMIN D) 2000 UNITS CAPS Take 1 capsule by mouth daily.    ? clonazePAM (KLONOPIN) 1 MG tablet Take 1 mg by mouth at bedtime as needed.    ? Coenzyme Q10 (COQ10 PO) Take 100 mg by mouth daily.    ? fluticasone (FLONASE) 50 MCG/ACT nasal spray Place 2 sprays into both nostrils daily as needed. 48 g 3  ? lamoTRIgine (LAMICTAL) 100 MG tablet Take 100 mg by mouth daily.    ? levothyroxine (SYNTHROID) 50 MCG tablet Take 1 tablet (50 mcg total) by mouth daily. 90 tablet 0  ? lovastatin (MEVACOR) 40 MG tablet Take 1 tablet (40 mg total) by mouth daily. 90 tablet 0  ? Multiple Vitamin (MULTIVITAMIN) tablet Take 1 tablet by mouth daily.    ? Probiotic Product (PROBIOTIC ADVANCED PO) Take by mouth.    ? tiZANidine (ZANAFLEX) 4 MG tablet Take 1 tablet by mouth at bedtime as needed.  0  ? zolpidem (AMBIEN) 10 MG tablet Take 10 mg by mouth at bedtime.    ? clindamycin  (CLINDAGEL) 1 % gel Apply to affected area 2 times daily until resolution 30 g 0  ? clobetasol ointment (TEMOVATE) 0.05 % Apply to affected area every night for 4 weeks, then every other day for 4 weeks and then twice a week for 4 weeks or until resolution. 30 g 5  ? metroNIDAZOLE (METROCREAM) 0.75 % cream Apply to affected areas face once to twice daily. 45 g 3  ? mupirocin ointment (BACTROBAN) 2 % Apply to affected area once daily with dressing change. 22 g 0  ? ?No current facility-administered medications for this visit.  ? ? ?Review of Systems  ?Constitutional: Negative for chills, fatigue, fever and unexpected weight change.  ?HENT: Negative for trouble swallowing.  ?Eyes: Negative for loss of vision.  ?Respiratory: Negative for cough, shortness of breath and wheezing.  ?Cardiovascular: Negative for chest pain, leg swelling, palpitations and syncope.  ?GI: Negative for abdominal pain, blood in stool, diarrhea, nausea and vomiting.  ?GU: Negative for difficulty urinating, dysuria, frequency and hematuria.  ?Musculoskeletal: Negative for back pain, leg pain and joint pain.  ?Skin: Negative for rash.  ?Neurological: Negative for dizziness, headaches, light-headedness, numbness and seizures.  ?Psychiatric: Negative for behavioral problem, confusion, depressed mood and sleep disturbance.   ? ?   ?Objective:  ?Objective  ? ?Vitals:  ? 03/21/22 1100  ?BP: 120/80  ?Weight: 218 lb (98.9 kg)  ?Height: 5' 0.5" (1.537 m)  ? ?Body mass index is 41.87 kg/m?. ? ?Physical Exam ?Genitourinary: ? ? ? ? ?Assessment/Plan:  ?  ? ?66 yo with small melanotic change to right labia ?Stable since prior exam. Encouraged yearly follow up for vulvar exam. Biopsy if growth or changes.  ? ?More than 10 minutes were spent face to face with the patient in the room, reviewing the medical record, labs and images, and coordinating care for the patient. The plan of management was discussed in detail and counseling was provided.  ?  ?Adrian Prows MD ?[place], [place] ?03/21/2022 ?11:10 AM ? ? ?

## 2022-03-26 ENCOUNTER — Ambulatory Visit: Payer: Medicare HMO | Admitting: Dermatology

## 2022-04-06 DIAGNOSIS — H524 Presbyopia: Secondary | ICD-10-CM | POA: Diagnosis not present

## 2022-04-16 DIAGNOSIS — E7800 Pure hypercholesterolemia, unspecified: Secondary | ICD-10-CM | POA: Diagnosis not present

## 2022-04-16 DIAGNOSIS — R0602 Shortness of breath: Secondary | ICD-10-CM | POA: Diagnosis not present

## 2022-04-16 DIAGNOSIS — R079 Chest pain, unspecified: Secondary | ICD-10-CM | POA: Diagnosis not present

## 2022-04-25 ENCOUNTER — Ambulatory Visit (INDEPENDENT_AMBULATORY_CARE_PROVIDER_SITE_OTHER): Payer: Medicare HMO | Admitting: Family Medicine

## 2022-04-25 ENCOUNTER — Encounter: Payer: Self-pay | Admitting: Family Medicine

## 2022-04-25 DIAGNOSIS — K649 Unspecified hemorrhoids: Secondary | ICD-10-CM | POA: Diagnosis not present

## 2022-04-25 MED ORDER — HYDROCORTISONE (PERIANAL) 2.5 % EX CREA: 1.0000 "application " | TOPICAL_CREAM | Freq: Two times a day (BID) | CUTANEOUS | 0 refills | Status: DC

## 2022-04-25 NOTE — Patient Instructions (Addendum)
Hold off on anything that constipates you for now  ? ?Keep area clean /gently  ? ?Anusol cream-use as directed for 10-14 days  ? ?Gentle warm or cool compress may help or a sitz bath  ? ?If riding the bike bothers you-do something else for a while  ? ? ? ? ?

## 2022-04-25 NOTE — Assessment & Plan Note (Addendum)
External hemorrhoid with irritation noted ?This occurred after straining and harder stools  ?No bleeding ? ?inst to stop things that cause constipation /allow stool to be soft  ?Watch straining at the gym as well as sitting on bike seat  ?Warm/cool compressor sitz bath  ?anusol hc -to use as directed bid for 10-14 d  ?Update if not starting to improve in a week or if worsening   ? ?

## 2022-04-25 NOTE — Progress Notes (Signed)
? ?  Subjective:  ? ? Patient ID: [name] [name], female    DOB: 07-14-1956, 66 y.o.   MRN: [id number] ? ?HPI ?Pt presents with c/o hemorrhoids ? ?Wt Readings from Last 3 Encounters:  ?04/25/22 219 lb (99.3 kg)  ?03/21/22 218 lb (98.9 kg)  ?12/19/21 215 lb 3.2 oz (97.6 kg)  ? ?42.07 kg/m? ? ?Sore hemorrhoid   (external)  ?L side of her anal area is very sore  ?Tried px cream (for bact vaginal infx) ?Vaseline ?OTC hemorrhoid cream  ? ?She was taking probiotics-made her a little constipated  ?Used to have diarrhea all the time /now not ?No bleeding  ? ?No heavy lifting but does work out  ?She does some crunches with work outs ?Does pedal a bike  ? ?No abdominal pain  ?No blood in stool  ? ?Has never had a hemorrhoid procedure or surgery  ? ? ?Last colonoscopy 2016 ? ?Review of Systems ? ?   ?Objective:  ? Physical Exam ?Exam conducted with a chaperone present.  ?Constitutional:   ?   General: She is not in acute distress. ?   Appearance: Normal appearance. She is obese. She is not ill-appearing.  ?Eyes:  ?   Conjunctiva/sclera: Conjunctivae normal.  ?   Pupils: Pupils are equal, round, and reactive to light.  ?Cardiovascular:  ?   Rate and Rhythm: Normal rate and regular rhythm.  ?Pulmonary:  ?   Effort: Pulmonary effort is normal. No respiratory distress.  ?   Breath sounds: Normal breath sounds. No wheezing.  ?Abdominal:  ?   General: There is no distension.  ?   Palpations: There is no mass.  ?   Tenderness: There is no abdominal tenderness.  ?   Hernia: No hernia is present.  ?Genitourinary: ?   Exam position: Knee-chest position.  ?   Rectum: Tenderness and external hemorrhoid present.  ?   Comments: Small external non thrombosed ext hemorrhoid at 12:00 with some surrounding and L sided erythema  ?Minimally tender  ?Nl rectal tone ?No bleeding  ?Musculoskeletal:  ?   Cervical back: Neck supple.  ?Skin: ?   General: Skin is warm and dry.  ?   Coloration: Skin is pale.  ?   Findings: No rash.  ?Psychiatric:     ?    Mood and Affect: Mood normal.  ? ? ? ? ? ?   ?Assessment & Plan:  ? ?Problem List Items Addressed This Visit   ? ?  ? Cardiovascular and Mediastinum  ? Hemorrhoid  ?  External hemorrhoid with irritation noted ?This occurred after straining and harder stools  ?No bleeding ? ?inst to stop things that cause constipation /allow stool to be soft  ?Watch straining at the gym as well as sitting on bike seat  ?Warm/cool compressor sitz bath  ?anusol hc -to use as directed bid for 10-14 d  ?Update if not starting to improve in a week or if worsening   ? ? ?  ?  ? ? ? ?

## 2022-04-27 DIAGNOSIS — H26492 Other secondary cataract, left eye: Secondary | ICD-10-CM | POA: Diagnosis not present

## 2022-05-01 ENCOUNTER — Telehealth: Payer: Self-pay | Admitting: Family Medicine

## 2022-05-01 NOTE — Telephone Encounter (Signed)
Pt was calling to reschedule her Health Nurse telephone appointment call on 05/18/2022 '@12'$ :00 pm. Please advise thank you.  Callback Number: [phone]

## 2022-05-04 ENCOUNTER — Ambulatory Visit (INDEPENDENT_AMBULATORY_CARE_PROVIDER_SITE_OTHER): Payer: Medicare HMO | Admitting: Psychology

## 2022-05-04 DIAGNOSIS — F3181 Bipolar II disorder: Secondary | ICD-10-CM | POA: Diagnosis not present

## 2022-05-04 NOTE — Progress Notes (Signed)
                Dajah Fischman G Jacquis Paxton, LCSW

## 2022-05-11 ENCOUNTER — Other Ambulatory Visit: Payer: Self-pay | Admitting: Family Medicine

## 2022-05-17 ENCOUNTER — Telehealth: Payer: Self-pay | Admitting: Family Medicine

## 2022-05-17 DIAGNOSIS — R7309 Other abnormal glucose: Secondary | ICD-10-CM

## 2022-05-17 DIAGNOSIS — E7800 Pure hypercholesterolemia, unspecified: Secondary | ICD-10-CM

## 2022-05-17 DIAGNOSIS — R7989 Other specified abnormal findings of blood chemistry: Secondary | ICD-10-CM

## 2022-05-17 DIAGNOSIS — E039 Hypothyroidism, unspecified: Secondary | ICD-10-CM

## 2022-05-17 DIAGNOSIS — K529 Noninfective gastroenteritis and colitis, unspecified: Secondary | ICD-10-CM

## 2022-05-17 NOTE — Telephone Encounter (Signed)
-----   Message from Ellamae Sia sent at 05/03/2022 10:48 AM EDT ----- Regarding: Lab orders for Friday, 6.9.23 Patient is scheduled for CPX labs, please order future labs, Thanks , Karna Christmas

## 2022-05-18 ENCOUNTER — Ambulatory Visit: Payer: Medicare Other

## 2022-05-18 ENCOUNTER — Other Ambulatory Visit (INDEPENDENT_AMBULATORY_CARE_PROVIDER_SITE_OTHER): Payer: Medicare HMO

## 2022-05-18 DIAGNOSIS — R7309 Other abnormal glucose: Secondary | ICD-10-CM

## 2022-05-18 DIAGNOSIS — E039 Hypothyroidism, unspecified: Secondary | ICD-10-CM

## 2022-05-18 DIAGNOSIS — R7989 Other specified abnormal findings of blood chemistry: Secondary | ICD-10-CM | POA: Diagnosis not present

## 2022-05-18 DIAGNOSIS — K529 Noninfective gastroenteritis and colitis, unspecified: Secondary | ICD-10-CM

## 2022-05-18 DIAGNOSIS — E7800 Pure hypercholesterolemia, unspecified: Secondary | ICD-10-CM | POA: Diagnosis not present

## 2022-05-18 LAB — CBC WITH DIFFERENTIAL/PLATELET
Basophils Absolute: 0 10*3/uL (ref 0.0–0.1)
Basophils Relative: 1 % (ref 0.0–3.0)
Eosinophils Absolute: 0.1 10*3/uL (ref 0.0–0.7)
Eosinophils Relative: 1.5 % (ref 0.0–5.0)
HCT: 39.8 % (ref 36.0–46.0)
Hemoglobin: 13 g/dL (ref 12.0–15.0)
Lymphocytes Relative: 34.8 % (ref 12.0–46.0)
Lymphs Abs: 1.7 10*3/uL (ref 0.7–4.0)
MCHC: 32.7 g/dL (ref 30.0–36.0)
MCV: 91.7 fl (ref 78.0–100.0)
Monocytes Absolute: 0.3 10*3/uL (ref 0.1–1.0)
Monocytes Relative: 6.2 % (ref 3.0–12.0)
Neutro Abs: 2.8 10*3/uL (ref 1.4–7.7)
Neutrophils Relative %: 56.5 % (ref 43.0–77.0)
Platelets: 218 10*3/uL (ref 150.0–400.0)
RBC: 4.34 Mil/uL (ref 3.87–5.11)
RDW: 13.6 % (ref 11.5–15.5)
WBC: 5 10*3/uL (ref 4.0–10.5)

## 2022-05-18 LAB — COMPREHENSIVE METABOLIC PANEL
ALT: 11 U/L (ref 0–35)
AST: 17 U/L (ref 0–37)
Albumin: 4.2 g/dL (ref 3.5–5.2)
Alkaline Phosphatase: 72 U/L (ref 39–117)
BUN: 15 mg/dL (ref 6–23)
CO2: 26 mEq/L (ref 19–32)
Calcium: 9.6 mg/dL (ref 8.4–10.5)
Chloride: 105 mEq/L (ref 96–112)
Creatinine, Ser: 1.19 mg/dL (ref 0.40–1.20)
GFR: 48.43 mL/min — ABNORMAL LOW (ref >60.00)
Glucose, Bld: 88 mg/dL (ref 70–99)
Potassium: 4.4 mEq/L (ref 3.5–5.1)
Sodium: 141 mEq/L (ref 135–145)
Total Bilirubin: 0.5 mg/dL (ref 0.2–1.2)
Total Protein: 6.6 g/dL (ref 6.0–8.3)

## 2022-05-18 LAB — LIPID PANEL
Cholesterol: 171 mg/dL (ref 0–200)
HDL: 68.4 mg/dL (ref >39.00)
LDL Cholesterol: 75 mg/dL (ref 0–99)
NonHDL: 102.42
Total CHOL/HDL Ratio: 2
Triglycerides: 138 mg/dL (ref 0.0–149.0)
VLDL: 27.6 mg/dL (ref 0.0–40.0)

## 2022-05-18 LAB — HEMOGLOBIN A1C: Hgb A1c MFr Bld: 5.6 % (ref 4.6–6.5)

## 2022-05-18 LAB — TSH: TSH: 2.15 u[IU]/mL (ref 0.35–5.50)

## 2022-05-24 DIAGNOSIS — Z961 Presence of intraocular lens: Secondary | ICD-10-CM | POA: Diagnosis not present

## 2022-05-25 ENCOUNTER — Ambulatory Visit (INDEPENDENT_AMBULATORY_CARE_PROVIDER_SITE_OTHER): Payer: Medicare HMO

## 2022-05-25 ENCOUNTER — Encounter: Payer: Medicare HMO | Admitting: Family Medicine

## 2022-05-25 VITALS — Wt 219.0 lb

## 2022-05-25 DIAGNOSIS — Z0000 Encounter for general adult medical examination without abnormal findings: Secondary | ICD-10-CM | POA: Diagnosis not present

## 2022-05-25 DIAGNOSIS — M179 Osteoarthritis of knee, unspecified: Secondary | ICD-10-CM | POA: Insufficient documentation

## 2022-05-25 NOTE — Progress Notes (Signed)
Virtual Visit via Telephone Note  I connected with  [name] [name] on 05/25/22 at 12:00 PM EDT by telephone and verified that I am speaking with the correct person using two identifiers.  Location: Patient: home Provider: [place] Persons participating in the virtual visit: [facility]   I discussed the limitations, risks, security and privacy concerns of performing an evaluation and management service by telephone and the availability of in person appointments. The patient expressed understanding and agreed to proceed.  Interactive audio and video telecommunications were attempted between this nurse and patient, however failed, due to patient having technical difficulties OR patient did not have access to video capability.  We continued and completed visit with audio only.  Some vital signs may be absent or patient reported.   Dionisio David, LPN  Subjective:   [name] [name] is a 67 y.o. female who presents for Medicare Annual (Subsequent) preventive examination.  Review of Systems     Cardiac Risk Factors include: advanced age (>74mn, >>40women)     Objective:    There were no vitals filed for this visit. There is no height or weight on file to calculate BMI.     05/25/2022   12:00 PM 12/15/2021    9:55 PM 05/17/2021   11:11 AM 05/16/2020   11:15 AM 05/08/2019   12:15 PM 02/24/2018   10:15 AM 02/20/2017   10:06 AM  Advanced Directives  Does Patient Have a Medical Advance Directive? No No Yes Yes No No Yes  Type of AComptrollerLiving will [place]Living will   [place] Center[place] will  Copy of [place]?   No - copy requested No - copy requested   No - copy requested  Would patient like information on creating a medical advance directive? No - Patient declined    No - Patient declined No - Patient declined     Current Medications  (verified) Outpatient Encounter Medications as of 05/25/2022  Medication Sig   ARIPiprazole (ABILIFY) 10 MG tablet Take 10 mg by mouth daily.   aspirin 81 MG tablet Take 81 mg by mouth daily.   BIOTIN PO Take 1 tablet by mouth daily.   buPROPion (WELLBUTRIN SR) 150 MG 12 hr tablet Budeprion SR 150 mg tablet, sustained release  Take 1 tablet twice a day by oral route.   buPROPion (WELLBUTRIN XL) 150 MG 24 hr tablet Take three tablets by mouth every morning   cetirizine (ZYRTEC ALLERGY) 10 MG tablet Zyrtec 10 mg tablet  Take 1 tablet every day by oral route.   Cholecalciferol (VITAMIN D) 2000 UNITS CAPS Take 1 capsule by mouth daily.   clonazePAM (KLONOPIN) 1 MG tablet Klonopin 1 mg tablet  Take 1 tablet 3 times a day by oral route.   Coenzyme Q10 (COQ10 PO) Take 100 mg by mouth daily.   fluticasone (FLONASE) 50 MCG/ACT nasal spray Place 2 sprays into both nostrils daily as needed.   hydrocortisone (ANUSOL-HC) 2.5 % rectal cream Apply 1 application. topically 2 (two) times daily. To affected area twice daily (around anus/ hemorrhoid)   lamoTRIgine (LAMICTAL) 100 MG tablet Take 100 mg by mouth daily.   levothyroxine (SYNTHROID) 50 MCG tablet Take 1 tablet (50 mcg total) by mouth daily before breakfast.   lovastatin (MEVACOR) 40 MG tablet TAKE 1 TABLET EVERY DAY   Probiotic Product (PROBIOTIC ADVANCED PO) Take by mouth.   tiZANidine (  ZANAFLEX) 4 MG tablet Take 1 tablet by mouth at bedtime as needed.   zolpidem (AMBIEN) 10 MG tablet Take 10 mg by mouth at bedtime.   ARIPiprazole (ABILIFY) 30 MG tablet Abilify 30 mg tablet (Patient not taking: Reported on 05/25/2022)   colchicine (COLCRYS) 0.6 MG tablet Colcrys 0.6 mg tablet (Patient not taking: Reported on 05/25/2022)   cyclobenzaprine (FLEXERIL) 10 MG tablet cyclobenzaprine 10 mg tablet (Patient not taking: Reported on 05/25/2022)   levothyroxine (SYNTHROID) 100 MCG tablet Synthroid (Patient not taking: Reported on 05/25/2022)   Multiple Vitamin  (MULTIVITAMIN) tablet Take 1 tablet by mouth daily. (Patient not taking: Reported on 05/25/2022)   [DISCONTINUED] Aspirin 81 MG CAPS aspirin (Patient not taking: Reported on 05/25/2022)   [DISCONTINUED] cetirizine (ZYRTEC) 10 MG tablet Take 10 mg by mouth daily as needed for allergies.   [DISCONTINUED] clonazePAM (KLONOPIN) 1 MG tablet Take 1 mg by mouth at bedtime as needed.   [DISCONTINUED] lovastatin (MEVACOR) 40 MG tablet lovastatin 40 mg tablet  Take 1 tablet every day by oral route.   No facility-administered encounter medications on file as of 05/25/2022.    Allergies (verified) Lidocaine, Xylocaine [lidocaine hcl], Amantadine, Codeine, Lithium, and Tegretol [carbamazepine]   History: Past Medical History:  Diagnosis Date   Actinic keratosis 03/29/2021   R central upper lip   Allergic rhinitis    Atypical mole 12/26/2021   spinal upper back, exc 01/15/22   Basal cell carcinoma 10/16/2010   R lower pretibia    Bipolar 1 disorder (HCC)    Fissure, anal    GERD (gastroesophageal reflux disease)    History of basal cell carcinoma excision    ON FACE   History of squamous cell carcinoma excision    ON RIGHT LEG   Hyperlipidemia    Hypothyroidism    IBS (irritable bowel syndrome)    Liver cyst    Mallet finger of left hand    small finger   Past Surgical History:  Procedure Laterality Date   CARDIAC CATHETERIZATION  04/1999   per pt normal   CARDIOVASCULAR STRESS TEST  02-23-2011  dr Chrissie Noa [facility] clinic)   normal nuclear study/  no ischemia/  ef 64%   CATARACT EXTRACTION W/ INTRAOCULAR LENS  IMPLANT, BILATERAL  2003   Bil   CESAREAN SECTION  1984   CLOSED REDUCTION FINGER WITH PERCUTANEOUS PINNING Left 04/22/2014   Procedure: LEFT SMALL FINGER CLOSED REDUCTION PINNING;  Surgeon: Linna Hoff, MD;  Location: [facility];  Service: Orthopedics;  Laterality: Left;   COLONOSCOPY  11/2002   LAPAROSCOPIC CHOLECYSTECTOMY  03-18-2000   TRANSTHORACIC  ECHOCARDIOGRAM  02-23-2011   normal lvf/  ef 58%/  mild rve/   mild lae/  mild  mr  & tr/  mild pulmonary htn   WISDOM TOOTH EXTRACTION     Family History  Problem Relation Age of Onset   Stroke Mother    Heart disease Mother    Hypertension Father    Diabetes Father    Hyperlipidemia Father    Diabetes Brother    Social History   Socioeconomic History   Marital status: Married    Spouse name: Not on file   Number of children: 1   Years of education: Not on file   Highest education level: Not on file  Occupational History   Occupation: Home    Employer: RETIRED  Tobacco Use   Smoking status: Never   Smokeless tobacco: Never  Vaping Use  Vaping Use: Never used  Substance and Sexual Activity   Alcohol use: Yes    Alcohol/week: 0.0 standard drinks of alcohol    Comment: wine- rarely   Drug use: No   Sexual activity: Not Currently    Birth control/protection: Post-menopausal  Other Topics Concern   Not on file  Social History Narrative   Moved here from [place]   Social Determinants of Health   Financial Resource Strain: Low Risk  (05/25/2022)   Overall Financial Resource Strain (CARDIA)    Difficulty of Paying Living Expenses: Not hard at all  Food Insecurity: No Food Insecurity (05/25/2022)   Hunger Vital Sign    Worried About Running Out of Food in the Last Year: Never true    Ran Out of Food in the Last Year: Never true  Transportation Needs: No Transportation Needs (05/25/2022)   PRAPARE - Hydrologist (Medical): No    Lack of Transportation (Non-Medical): No  Physical Activity: Sufficiently Active (05/25/2022)   Exercise Vital Sign    Days of Exercise per Week: 4 days    Minutes of Exercise per Session: 60 min  Stress: No Stress Concern Present (05/25/2022)   [place]    Feeling of Stress : Not at all  Social Connections: Moderately Integrated (05/25/2022)   Social  Connection and Isolation Panel [NHANES]    Frequency of Communication with Friends and Family: More than three times a week    Frequency of Social Gatherings with Friends and Family: Once a week    Attends Religious Services: More than 4 times per year    Active Member of [employer] or Organizations: No    Attends Music therapist: Never    Marital Status: Married    Tobacco Counseling Counseling given: Not Answered   Clinical Intake:  Pre-visit preparation completed: Yes  Pain : No/denies pain     Nutritional Risks: None Diabetes: No  How often do you need to have someone help you when you read instructions, pamphlets, or other written materials from your doctor or pharmacy?: 1 - Never  Diabetic?no  Interpreter Needed?: No  Information entered by :: Kirke Shaggy, LPN   Activities of Daily Living    05/25/2022   12:01 PM  In your present state of health, do you have any difficulty performing the following activities:  Hearing? 0  Vision? 0  Difficulty concentrating or making decisions? 0  Walking or climbing stairs? 0  Dressing or bathing? 0  Doing errands, shopping? 0  Preparing Food and eating ? N  Using the Toilet? N  In the past six months, have you accidently leaked urine? N  Do you have problems with loss of bowel control? N  Managing your Medications? N  Managing your Finances? N  Housekeeping or managing your Housekeeping? N    Patient Care Team: [place], Wynelle Fanny, MD as PCP - [place], OD as Consulting Physician (Optometry)  Indicate any recent Medical Services you may have received from other than [facility] providers in the past year (date may be approximate).     Assessment:   This is a routine wellness examination for [place].  Hearing/Vision screen Hearing Screening - Comments:: Wears aids Vision Screening - Comments:: Readers- [place]  Dietary issues and exercise activities discussed: Current Exercise Habits: Home  exercise routine, Time (Minutes): 60, Frequency (Times/Week): 4, Weekly Exercise (Minutes/Week): 240, Intensity: Moderate   Goals Addressed  This Visit's Progress    DIET - EAT MORE FRUITS AND VEGETABLES         Depression Screen    05/25/2022   11:58 AM 11/10/2021    4:18 PM 05/17/2021   11:12 AM 08/16/2020    9:31 AM 05/16/2020   11:16 AM 05/08/2019   12:10 PM 05/08/2019   12:09 PM  PHQ 2/9 Scores  PHQ - 2 Score 0 1 4 0 6 3 0  PHQ- 9 Score 0 '9 4  6 7     '$ Fall Risk    05/25/2022   12:01 PM 05/17/2021   11:12 AM 08/16/2020    9:30 AM 05/16/2020   11:16 AM 05/08/2019   12:10 PM  Fall Risk   Falls in the past year? 1 0 0 0 1  Comment     fell while walking up steps on bus  Number falls in past yr: 0 0 0 0 0  Injury with Fall? 1 0  0 1  Risk for fall due to : History of fall(s) Medication side effect  Medication side effect   Follow up Falls evaluation completed;Falls prevention discussed Falls evaluation completed;Falls prevention discussed Falls evaluation completed Falls evaluation completed;Falls prevention discussed     FALL RISK PREVENTION PERTAINING TO THE HOME:  Any stairs in or around the home? Yes  If so, are there any without handrails? No  Home free of loose throw rugs in walkways, pet beds, electrical cords, etc? Yes  Adequate lighting in your home to reduce risk of falls? Yes   ASSISTIVE DEVICES UTILIZED TO PREVENT FALLS:  Life alert? No  Use of a cane, walker or w/c? No  Grab bars in the bathroom? No  Shower chair or bench in shower? No  Elevated toilet seat or a handicapped toilet? No    Cognitive Function:    05/17/2021   11:19 AM 05/16/2020   11:19 AM 05/08/2019   12:13 PM 02/24/2018   10:12 AM 02/20/2017   10:09 AM  MMSE - Mini Mental State Exam  Orientation to time '5 5 5 5 5  '$ Orientation to Place '5 5 5 5 5  '$ Registration '3 3 3 3 3  '$ Attention/ Calculation 5 5 0 0 0  Recall '3 3 3 3 2  '$ Recall-comments     pt was unable to recall 1 of 3 words   Language- name 2 objects   0 0 0  Language- repeat '1 1 1 1 1  '$ Language- follow 3 step command   0 3 3  Language- read & follow direction   0 0 0  Write a sentence   0 0 0  Copy design   0 0 0  Total score   '17 20 19        '$ 05/25/2022   12:02 PM  6CIT Screen  What Year? 0 points  What month? 0 points  What time? 0 points  Count back from 20 0 points  Months in reverse 0 points  Repeat phrase 0 points  Total Score 0 points    Immunizations Immunization History  Administered Date(s) Administered   Influenza Split 09/16/2012   Influenza Whole 10/01/2006, 09/14/2008, 09/07/2009, 09/04/2010, 08/21/2011   Influenza, Seasonal, Injecte, Preservative Fre 09/13/2016   Influenza,inj,Quad PF,6+ Mos 08/26/2017, 08/25/2018, 08/07/2019, 08/05/2020   Influenza-Unspecified 09/08/2013, 08/10/2014, 08/25/2015   PFIZER(Purple Top)SARS-COV-2 Vaccination 02/27/2020, 03/23/2020   Pneumococcal Polysaccharide-23 08/15/2006, 10/16/2013   Td 12/10/1998, 09/30/2009   Tdap 08/29/2017   Zoster Recombinat (Shingrix)  05/22/2021   Zoster, Live 04/13/2011    TDAP status: Up to date  Flu Vaccine status: Declined, Education has been provided regarding the importance of this vaccine but patient still declined. Advised may receive this vaccine at local pharmacy or Health Dept. Aware to provide a copy of the vaccination record if obtained from local pharmacy or Health Dept. Verbalized acceptance and understanding.  Pneumococcal vaccine status: Due, Education has been provided regarding the importance of this vaccine. Advised may receive this vaccine at local pharmacy or Health Dept. Aware to provide a copy of the vaccination record if obtained from local pharmacy or Health Dept. Verbalized acceptance and understanding.  Covid-19 vaccine status: Completed vaccines  Qualifies for Shingles Vaccine? Yes   Zostavax completed Yes   Shingrix Completed?: Yes  Screening Tests Health Maintenance  Topic Date Due    COVID-19 Vaccine (3 - Pfizer risk series) 04/20/2020   Zoster Vaccines- Shingrix (2 of 2) 07/17/2021   PAP SMEAR-Modifier  05/13/2022   HIV Screening  01/12/2024 (Originally 03/09/1971)   INFLUENZA VACCINE  07/10/2022   MAMMOGRAM  09/01/2022   COLONOSCOPY (Pts 45-70yr Insurance coverage will need to be confirmed)  05/19/2025   TETANUS/TDAP  08/30/2027   Hepatitis C Screening  Completed   HPV VACCINES  Aged Out    Health Maintenance  Health Maintenance Due  Topic Date Due   COVID-19 Vaccine (3 - Pfizer risk series) 04/20/2020   Zoster Vaccines- Shingrix (2 of 2) 07/17/2021   PAP SMEAR-Modifier  05/13/2022    Colorectal cancer screening: Type of screening: Colonoscopy. Completed 05/20/15. Repeat every 10 years  Mammogram status: Completed 09/26/21. Repeat every year   Lung Cancer Screening: (Low Dose CT Chest recommended if Age 242-80years, 30 pack-year currently smoking OR have quit w/in 15years.) does not qualify.   Additional Screening:  Hepatitis C Screening: does qualify; Completed 12/28/16  Vision Screening: Recommended annual ophthalmology exams for early detection of glaucoma and other disorders of the eye. Is the patient up to date with their annual eye exam?  Yes  Who is the provider or what is the name of the [facility] in which the patient attends annual [facility] exams[facility] provider, would they like to be referred to a provider to establish care? No .   Dental Screening: Recommended annual dental exams for proper oral hygiene  Community Resource Referral / Chronic Care Management: CRR required this visit?  No   CCM required this visit?  No      Plan:     I have personally reviewed and noted the following in the patient's chart:   Medical and social history Use of alcohol, tobacco or illicit drugs  Current medications and supplements including opioid prescriptions.  Functional ability and status Nutritional  status Physical activity Advanced directives List of other physicians Hospitalizations, surgeries, and ER visits in previous 12 months Vitals Screenings to include cognitive, depression, and falls Referrals and appointments  In addition, I have reviewed and discussed with patient certain preventive protocols, quality metrics, and best practice recommendations. A written personalized care plan for preventive services as well as general preventive health recommendations were provided to patient.     LDionisio David LPN   [id number]  Nurse Notes: none

## 2022-05-25 NOTE — Patient Instructions (Signed)
[name] , Thank you for taking time to come for your Medicare Wellness Visit. I appreciate your ongoing commitment to your health goals. Please review the following plan we discussed and let me know if I can assist you in the future.   Screening recommendations/referrals: Colonoscopy: 05/20/15 Mammogram: 1018/22 Bone Density: too young Recommended yearly ophthalmology/optometry visit for glaucoma screening and checkup Recommended yearly dental visit for hygiene and checkup  Vaccinations: Influenza vaccine: n/d Pneumococcal vaccine: 10/16/13, due Tdap vaccine: 08/29/17 Shingles vaccine: Shingrix 05/22/21, 07/2021 (doesn't have exact date)  Zostavax 04/13/11  Covid-19: 02/27/20, 03/23/20  Advanced directives: no  Conditions/risks identified: none  Next appointment: Follow up in one year for your annual wellness visit. 05/27/23 @ 11am by phone  Preventive Care 40-64 Years, Female Preventive care refers to lifestyle choices and visits with your health care provider that can promote health and wellness. What does preventive care include? A yearly physical exam. This is also called an annual well check. Dental exams once or twice a year. Routine eye exams. Ask your health care provider how often you should have your eyes checked. Personal lifestyle choices, including: Daily care of your teeth and gums. Regular physical activity. Eating a healthy diet. Avoiding tobacco and drug use. Limiting alcohol use. Practicing safe sex. Taking low-dose aspirin daily starting at age 56. Taking vitamin and mineral supplements as recommended by your health care provider. What happens during an annual well check? The services and screenings done by your health care provider during your annual well check will depend on your age, overall health, lifestyle risk factors, and family history of disease. Counseling  Your health care provider may ask you questions about your: Alcohol use. Tobacco use. Drug  use. Emotional well-being. Home and relationship well-being. Sexual activity. Eating habits. Work and work Statistician. Method of birth control. Menstrual cycle. Pregnancy history. Screening  You may have the following tests or measurements: Height, weight, and BMI. Blood pressure. Lipid and cholesterol levels. These may be checked every 5 years, or more frequently if you are over 73 years old. Skin check. Lung cancer screening. You may have this screening every year starting at age 89 if you have a 30-pack-year history of smoking and currently smoke or have quit within the past 15 years. Fecal occult blood test (FOBT) of the stool. You may have this test every year starting at age 36. Flexible sigmoidoscopy or colonoscopy. You may have a sigmoidoscopy every 5 years or a colonoscopy every 10 years starting at age 55. Hepatitis C blood test. Hepatitis B blood test. Sexually transmitted disease (STD) testing. Diabetes screening. This is done by checking your blood sugar (glucose) after you have not eaten for a while (fasting). You may have this done every 1-3 years. Mammogram. This may be done every 1-2 years. Talk to your health care provider about when you should start having regular mammograms. This may depend on whether you have a family history of breast cancer. BRCA-related cancer screening. This may be done if you have a family history of breast, ovarian, tubal, or peritoneal cancers. Pelvic exam and Pap test. This may be done every 3 years starting at age 42. Starting at age 93, this may be done every 5 years if you have a Pap test in combination with an HPV test. Bone density scan. This is done to screen for osteoporosis. You may have this scan if you are at high risk for osteoporosis. Discuss your test results, treatment options, and if necessary, the need  for more tests with your health care provider. Vaccines  Your health care provider may recommend certain vaccines, such  as: Influenza vaccine. This is recommended every year. Tetanus, diphtheria, and acellular pertussis (Tdap, Td) vaccine. You may need a Td booster every 10 years. Zoster vaccine. You may need this after age 11. Pneumococcal 13-valent conjugate (PCV13) vaccine. You may need this if you have certain conditions and were not previously vaccinated. Pneumococcal polysaccharide (PPSV23) vaccine. You may need one or two doses if you smoke cigarettes or if you have certain conditions. Talk to your health care provider about which screenings and vaccines you need and how often you need them. This information is not intended to replace advice given to you by your health care provider. Make sure you discuss any questions you have with your health care provider. Document Released: 12/23/2015 Document Revised: 08/15/2016 Document Reviewed: 09/27/2015 Elsevier Interactive Patient Education  2017 [employer] Prevention in the Home Falls can cause injuries. They can happen to people of all ages. There are many things you can do to make your home safe and to help prevent falls. What can I do on the outside of my home? Regularly fix the edges of walkways and driveways and fix any cracks. Remove anything that might make you trip as you walk through a door, such as a raised step or threshold. Trim any bushes or trees on the path to your home. Use bright outdoor lighting. Clear any walking paths of anything that might make someone trip, such as rocks or tools. Regularly check to see if handrails are loose or broken. Make sure that both sides of any steps have handrails. Any raised decks and porches should have guardrails on the edges. Have any leaves, snow, or ice cleared regularly. Use sand or salt on walking paths during winter. Clean up any spills in your garage right away. This includes oil or grease spills. What can I do in the bathroom? Use night lights. Install grab bars by the toilet and in  the tub and shower. Do not use towel bars as grab bars. Use non-skid mats or decals in the tub or shower. If you need to sit down in the shower, use a plastic, non-slip stool. Keep the floor dry. Clean up any water that spills on the floor as soon as it happens. Remove soap buildup in the tub or shower regularly. Attach bath mats securely with double-sided non-slip rug tape. Do not have throw rugs and other things on the floor that can make you trip. What can I do in the bedroom? Use night lights. Make sure that you have a light by your bed that is easy to reach. Do not use any sheets or blankets that are too big for your bed. They should not hang down onto the floor. Have a firm chair that has side arms. You can use this for support while you get dressed. Do not have throw rugs and other things on the floor that can make you trip. What can I do in the kitchen? Clean up any spills right away. Avoid walking on wet floors. Keep items that you use a lot in easy-to-reach places. If you need to reach something above you, use a strong step stool that has a grab bar. Keep electrical cords out of the way. Do not use floor polish or wax that makes floors slippery. If you must use wax, use non-skid floor wax. Do not have throw rugs and other  things on the floor that can make you trip. What can I do with my stairs? Do not leave any items on the stairs. Make sure that there are handrails on both sides of the stairs and use them. Fix handrails that are broken or loose. Make sure that handrails are as long as the stairways. Check any carpeting to make sure that it is firmly attached to the stairs. Fix any carpet that is loose or worn. Avoid having throw rugs at the top or bottom of the stairs. If you do have throw rugs, attach them to the floor with carpet tape. Make sure that you have a light switch at the top of the stairs and the bottom of the stairs. If you do not have them, ask someone to add them  for you. What else can I do to help prevent falls? Wear shoes that: Do not have high heels. Have rubber bottoms. Are comfortable and fit you well. Are closed at the toe. Do not wear sandals. If you use a stepladder: Make sure that it is fully opened. Do not climb a closed stepladder. Make sure that both sides of the stepladder are locked into place. Ask someone to hold it for you, if possible. Clearly mark and make sure that you can see: Any grab bars or handrails. First and last steps. Where the edge of each step is. Use tools that help you move around (mobility aids) if they are needed. These include: Canes. Walkers. Scooters. Crutches. Turn on the lights when you go into a dark area. Replace any light bulbs as soon as they burn out. Set up your furniture so you have a clear path. Avoid moving your furniture around. If any of your floors are uneven, fix them. If there are any pets around you, be aware of where they are. Review your medicines with your doctor. Some medicines can make you feel dizzy. This can increase your chance of falling. Ask your doctor what other things that you can do to help prevent falls. This information is not intended to replace advice given to you by your health care provider. Make sure you discuss any questions you have with your health care provider. Document Released: 09/22/2009 Document Revised: 05/03/2016 Document Reviewed: 12/31/2014 Elsevier Interactive Patient Education  2017 [employer].

## 2022-05-28 ENCOUNTER — Other Ambulatory Visit (HOSPITAL_COMMUNITY)
Admission: RE | Admit: 2022-05-28 | Discharge: 2022-05-28 | Disposition: A | Discharge status: Home / Self Care | Payer: Medicare HMO | Source: Ambulatory Visit | Attending: Family Medicine | Admitting: Family Medicine

## 2022-05-28 ENCOUNTER — Ambulatory Visit (INDEPENDENT_AMBULATORY_CARE_PROVIDER_SITE_OTHER): Payer: Medicare HMO | Admitting: Family Medicine

## 2022-05-28 ENCOUNTER — Encounter: Payer: Self-pay | Admitting: Family Medicine

## 2022-05-28 VITALS — BP 118/76 | HR 67 | Ht 60.0 in | Wt 219.4 lb

## 2022-05-28 DIAGNOSIS — R7309 Other abnormal glucose: Secondary | ICD-10-CM

## 2022-05-28 DIAGNOSIS — Z01419 Encounter for gynecological examination (general) (routine) without abnormal findings: Secondary | ICD-10-CM | POA: Diagnosis not present

## 2022-05-28 DIAGNOSIS — F3177 Bipolar disorder, in partial remission, most recent episode mixed: Secondary | ICD-10-CM | POA: Diagnosis not present

## 2022-05-28 DIAGNOSIS — E039 Hypothyroidism, unspecified: Secondary | ICD-10-CM

## 2022-05-28 DIAGNOSIS — Z1151 Encounter for screening for human papillomavirus (HPV): Secondary | ICD-10-CM | POA: Diagnosis not present

## 2022-05-28 DIAGNOSIS — E7800 Pure hypercholesterolemia, unspecified: Secondary | ICD-10-CM | POA: Diagnosis not present

## 2022-05-28 DIAGNOSIS — Z0000 Encounter for general adult medical examination without abnormal findings: Secondary | ICD-10-CM

## 2022-05-28 NOTE — Progress Notes (Signed)
Subjective:    Patient ID: [name] [name], female    DOB: Jan 09, 1956, 66 y.o.   MRN: [id number]  HPI Here for health maintenance exam and to review chronic medical problems    Wt Readings from Last 3 Encounters:  05/28/22 219 lb 6.4 oz (99.5 kg)  05/25/22 219 lb (99.3 kg)  04/25/22 219 lb (99.3 kg)   42.85 kg/m  Has travel planned this summer  Feeling good   Working out / class and bike Trying to cut out some sugar and it feels good   Now needs less tylenol also     Immunization History  Administered Date(s) Administered   Influenza Split 09/16/2012   Influenza Whole 10/01/2006, 09/14/2008, 09/07/2009, 09/04/2010, 08/21/2011   Influenza, Seasonal, Injecte, Preservative Fre 09/13/2016   Influenza,inj,Quad PF,6+ Mos 08/26/2017, 08/25/2018, 08/07/2019, 08/05/2020   Influenza-Unspecified 09/08/2013, 08/10/2014, 08/25/2015   PFIZER(Purple Top)SARS-COV-2 Vaccination 02/27/2020, 03/23/2020   Pneumococcal Polysaccharide-23 08/15/2006, 10/16/2013   Td 12/10/1998, 09/30/2009   Tdap 08/29/2017   Zoster Recombinat (Shingrix) 05/22/2021   Zoster, Live 04/13/2011   Had amw on 6/16- reviewed     Shingrix-she did have the 2nd tone at [facility]   Pap 05/2019 nl with neg HPV She wants to have a pap today  She had a bartholin cyst this year /it did get infected  Some vaginal dryness    Mammogram 08/2021/ had addn views for dense area that was nl in oc  Self breast exam: no lumps   Colonoscopy 05/2015 with 10 y recall   BP Readings from Last 3 Encounters:  05/28/22 118/76  04/25/22 126/78  03/21/22 120/80   Pulse Readings from Last 3 Encounters:  05/28/22 67  04/25/22 68  12/16/21 60      Hypothyroidism  Pt has no clinical changes No change in energy level/ hair or skin/ edema and no tremor Lab Results  Component Value Date   TSH 2.15 05/18/2022    Levothyroxine 50 mcg daily   Hyperlipidemia  Lab Results  Component Value Date   CHOL 171 05/18/2022   CHOL 189  05/17/2021   CHOL 182 05/12/2020   Lab Results  Component Value Date   HDL 68.40 05/18/2022   HDL 66.90 05/17/2021   HDL 66.90 05/12/2020   Lab Results  Component Value Date   LDLCALC 75 05/18/2022   [place] 95 05/17/2021   LDLCALC 95 05/12/2020   Lab Results  Component Value Date   TRIG 138.0 05/18/2022   TRIG 140.0 05/17/2021   TRIG 103.0 05/12/2020   Lab Results  Component Value Date   CHOLHDL 2 05/18/2022   CHOLHDL 3 05/17/2021   CHOLHDL 3 05/12/2020   No results found for: "LDLDIRECT" Lovastatin 40 mg daily  Is eating better and working out  LDL down 20 pt   Elevated glucose Lab Results  Component Value Date   HGBA1C 5.6 05/18/2022  Down from 6.0   Lab Results  Component Value Date   CREATININE 1.19 05/18/2022   BUN 15 05/18/2022   NA 141 05/18/2022   K 4.4 05/18/2022   CL 105 05/18/2022   CO2 26 05/18/2022   Lab Results  Component Value Date   ALT 11 05/18/2022   AST 17 05/18/2022   ALKPHOS 72 05/18/2022   BILITOT 0.5 05/18/2022    Lab Results  Component Value Date   WBC 5.0 05/18/2022   HGB 13.0 05/18/2022   HCT 39.8 05/18/2022   MCV 91.7 05/18/2022   PLT 218.0 05/18/2022  Patient Active Problem List   Diagnosis Date Noted   Osteoarthritis of knee 05/25/2022   Hemorrhoid 04/25/2022   Positive self-administered antigen test for COVID-19 02/19/2022   Estrogen deficiency 01/02/2022   Vaginal dryness, menopausal 05/22/2021   Bipolar disorder, in partial remission, most recent episode mixed ([place]) 02/25/2020   Elevated glucose level 03/03/2018   Low back pain 12/23/2017   Atrophic vaginitis 07/08/2017   Cervical stenosis (uterine cervix) 01/03/2017   Hearing loss 10/25/2015   Routine general medical examination at a health care facility 10/17/2015   Encounter for routine gynecological examination 10/22/2014   Elevated serum creatinine 10/22/2014   Colon cancer screening 10/16/2013   Encounter for Medicare annual wellness exam  10/04/2011   METATARSALGIA 12/01/2009   Enthesopathy of ankle and tarsus 12/01/2009   POSTMENOPAUSAL STATUS 09/30/2009   ARTHRITIS, RIGHT HIP 03/10/2009   DEGENERATIVE [place] DISEASE, LUMBAR SPINE 03/10/2009   DDD (degenerative disc disease), lumbosacral 03/10/2009   ONYCHOMYCOSIS 09/17/2008   Hypothyroidism 08/05/2007   Morbid obesity ([place]) 08/05/2007   Bipolar disorder ([place]) 08/05/2007   ALLERGIC RHINITIS 08/05/2007   IBS 08/05/2007   Esophageal reflux 08/05/2007   HYPERCHOLESTEROLEMIA, PURE 07/07/2007   Past Medical History:  Diagnosis Date   Actinic keratosis 03/29/2021   R central upper lip   Allergic rhinitis    Atypical mole 12/26/2021   spinal upper back, exc 01/15/22   Basal cell carcinoma 10/16/2010   R lower pretibia    Bipolar 1 disorder (H[facility])    Fissure, anal    GERD (gastroesophageal reflux disease)    History of basal cell carcinoma excision    ON FACE   History of squamous cell carcinoma excision    ON RIGHT LEG   Hyperlipidemia    Hypothyroidism    IBS (irritable bowel syndrome)    Liver cyst    Mallet finger of left hand    small finger   Past Surgical History:  Procedure Laterality Date   CARDIAC CATHETERIZATION  04/1999   per pt normal   CARDIOVASCULAR STRESS TEST  02-23-2011  dr Chrissie Noa fath [facility])   normal nuclear study/  no ischemia/  ef 64%   CATARACT EXTRACTION W/ INTRAOCULAR LENS  IMPLANT, BILATERAL  2003   Bil   CESAREAN SECTION  1984   CLOSED REDUCTION FINGER WITH PERCUTANEOUS PINNING Left 04/22/2014   Procedure: LEFT SMALL FINGER CLOSED REDUCTION PINNING;  Surgeon: Linna Hoff, MD;  Location: [facility];  Service: Orthopedics;  Laterality: Left;   COLONOSCOPY  11/2002   LAPAROSCOPIC CHOLECYSTECTOMY  03-18-2000   TRANSTHORACIC ECHOCARDIOGRAM  02-23-2011   normal lvf/  ef 58%/  mild rve/   mild lae/  mild  mr  & tr/  mild pulmonary htn   WISDOM TOOTH EXTRACTION     Social History   Tobacco Use   Smoking  status: Never   Smokeless tobacco: Never  Vaping Use   Vaping Use: Never used  Substance Use Topics   Alcohol use: Yes    Alcohol/week: 0.0 standard drinks of alcohol    Comment: wine- rarely   Drug use: No   Family History  Problem Relation Age of Onset   Stroke Mother    Heart disease Mother    Hypertension Father    Diabetes Father    Hyperlipidemia Father    Diabetes Brother    Allergies  Allergen Reactions   Lidocaine Swelling    REACTION: swelling ALL CAINE DRUGS EXCEPT: marcaine & sensercaine Other reaction(s):  Edema   Xylocaine [Lidocaine Hcl] Swelling    ALLERGIC TO ALL CAINES EXCEPT SENSORCAINE , AND MARCAINE   Amantadine    Codeine Nausea And Vomiting    REACTION: vomits blood   Lithium Swelling   Tegretol [Carbamazepine] Other (See Comments)    REACTION: skin crawls   Current Outpatient Medications on File Prior to Visit  Medication Sig Dispense Refill   aspirin 81 MG tablet Take 81 mg by mouth daily.     BIOTIN PO Take 1 tablet by mouth daily.     buPROPion (WELLBUTRIN SR) 150 MG 12 hr tablet Budeprion SR 150 mg tablet, sustained release  Take 1 tablet twice a day by oral route.     buPROPion (WELLBUTRIN XL) 150 MG 24 hr tablet Take three tablets by mouth every morning     cetirizine (ZYRTEC ALLERGY) 10 MG tablet Zyrtec 10 mg tablet  Take 1 tablet every day by oral route.     Cholecalciferol (VITAMIN D) 2000 UNITS CAPS Take 1 capsule by mouth daily.     clonazePAM (KLONOPIN) 1 MG tablet Klonopin 1 mg tablet  Take 1 tablet 3 times a day by oral route.     Coenzyme Q10 (COQ10 PO) Take 100 mg by mouth daily.     cyclobenzaprine (FLEXERIL) 10 MG tablet      fluticasone (FLONASE) 50 MCG/ACT nasal spray Place 2 sprays into both nostrils daily as needed. 48 g 3   hydrocortisone (ANUSOL-HC) 2.5 % rectal cream Apply 1 application. topically 2 (two) times daily. To affected area twice daily (around anus/ hemorrhoid) 30 g 0   lamoTRIgine (LAMICTAL) 100 MG tablet  Take 100 mg by mouth daily.     levothyroxine (SYNTHROID) 50 MCG tablet Take 1 tablet (50 mcg total) by mouth daily before breakfast. 90 tablet 0   lovastatin (MEVACOR) 40 MG tablet TAKE 1 TABLET EVERY DAY 90 tablet 0   Probiotic Product (PROBIOTIC ADVANCED PO) Take by mouth.     tiZANidine (ZANAFLEX) 4 MG tablet Take 1 tablet by mouth at bedtime as needed.  0   zolpidem (AMBIEN) 10 MG tablet Take 10 mg by mouth at bedtime.     No current facility-administered medications on file prior to visit.    Review of Systems  Constitutional:  Negative for activity change, appetite change, fatigue, fever and unexpected weight change.  HENT:  Negative for congestion, ear pain, rhinorrhea, sinus pressure and sore throat.   Eyes:  Negative for pain, redness and visual disturbance.  Respiratory:  Negative for cough, shortness of breath and wheezing.   Cardiovascular:  Negative for chest pain and palpitations.  Gastrointestinal:  Negative for abdominal pain, blood in stool, constipation and diarrhea.  Endocrine: Negative for polydipsia and polyuria.  Genitourinary:  Negative for dysuria, frequency and urgency.  Musculoskeletal:  Positive for arthralgias. Negative for back pain and myalgias.  Skin:  Negative for pallor and rash.  Allergic/Immunologic: Negative for environmental allergies.  Neurological:  Negative for dizziness, syncope and headaches.  Hematological:  Negative for adenopathy. Does not bruise/bleed easily.  Psychiatric/Behavioral:  Negative for decreased concentration and dysphoric mood. The patient is not nervous/anxious.        Objective:   Physical Exam Constitutional:      General: She is not in acute distress.    Appearance: Normal appearance. She is well-developed. She is obese. She is not ill-appearing or diaphoretic.  HENT:     Head: Normocephalic and atraumatic.     Right Ear: Tympanic  membrane, ear canal and external ear normal.     Left Ear: Tympanic membrane, ear canal  and external ear normal.     Nose: Nose normal. No congestion.     Mouth/Throat:     Mouth: Mucous membranes are moist.     Pharynx: Oropharynx is clear. No posterior oropharyngeal erythema.  Eyes:     General: No scleral icterus.    Extraocular Movements: Extraocular movements intact.     Conjunctiva/sclera: Conjunctivae normal.     Pupils: Pupils are equal, round, and reactive to light.  Neck:     Thyroid: No thyromegaly.     Vascular: No carotid bruit or JVD.  Cardiovascular:     Rate and Rhythm: Normal rate and regular rhythm.     Pulses: Normal pulses.     Heart sounds: Normal heart sounds.     No gallop.  Pulmonary:     Effort: Pulmonary effort is normal. No respiratory distress.     Breath sounds: Normal breath sounds. No wheezing.     Comments: Good air exch Chest:     Chest wall: No tenderness.  Abdominal:     General: Bowel sounds are normal. There is no distension or abdominal bruit.     Palpations: Abdomen is soft. There is no mass.     Tenderness: There is no abdominal tenderness.     Hernia: No hernia is present.  Genitourinary:    Comments: Breast exam: No mass, nodules, thickening, tenderness, bulging, retraction, inflamation, nipple discharge or skin changes noted.  No axillary or clavicular LA.      Small lentigo near urethra               Anus appears normal w/o hemorrhoids or masses       External genitalia : nl appearance and hair distribution/no lesions       Urethral meatus : nl size, no lesions or prolapse       Urethra: no masses, tenderness or scarring      Bladder : no masses or tenderness       Vagina: nl general appearance, mild pale discharge  or  Lesions, no significant cystocele  or rectocele       Cervix: no lesions/ discharge or friability      Uterus: nl size, contour, position, and mobility (not fixed) , non tender      Adnexa : no masses, tenderness, enlargement or nodularity         Musculoskeletal:        General: No tenderness.  Normal range of motion.     Cervical back: Normal range of motion and neck supple. No rigidity. No muscular tenderness.     Right lower leg: No edema.     Left lower leg: No edema.     Comments: No kyphosis   Lymphadenopathy:     Cervical: No cervical adenopathy.  Skin:    General: Skin is warm and dry.     Coloration: Skin is not pale.     Findings: No erythema or rash.  Neurological:     Mental Status: She is alert. Mental status is at baseline.     Cranial Nerves: No cranial nerve deficit.     Motor: No abnormal muscle tone.     Coordination: Coordination normal.     Gait: Gait normal.     Deep Tendon Reflexes: Reflexes are normal and symmetric. Reflexes normal.  Psychiatric:        Mood and Affect: Mood  normal.        Cognition and Memory: Cognition and memory normal.           Assessment & Plan:   Problem List Items Addressed This Visit       Endocrine   Hypothyroidism    Hypothyroidism  Pt has no clinical changes No change in energy level/ hair or skin/ edema and no tremor Lab Results  Component Value Date   TSH 2.15 05/18/2022    Plan to continue levothyroxine 50 mcg daily          Other   Bipolar disorder ([place])    Per pt stable Doing well  Under care of psychiatry - most recent labs sent      Elevated glucose level    Lab Results  Component Value Date   HGBA1C 5.6 05/18/2022  disc imp of low glycemic diet and wt loss to prevent DM2  Doing well      Encounter for routine gynecological examination    Exam and pap done today  No complaints H/o vaginal dryness H/o skin infection recently  Watching lentigo near urethra with gyn      Relevant Orders   Cytology - PAP([place])   HYPERCHOLESTEROLEMIA, PURE    Disc goals for lipids and reasons to control them Rev last labs with pt Rev low sat fat diet in detail Plan to continue lovastatin 40 mg daily  Improved LDL with better diet  Commended !      Morbid obesity ([place])    Discussed  how this problem influences overall health and the risks it imposes  Reviewed plan for weight loss with lower calorie diet (via better food choices and also portion control or program like weight watchers) and exercise building up to or more than 30 minutes 5 days per week including some aerobic activity   Commended great diet and exercise effort       Routine general medical examination at a health care facility - Primary    Reviewed health habits including diet and exercise and skin cancer prevention Reviewed appropriate screening tests for age  Also reviewed health mt list, fam hx and immunization status , as well as social and family history   See HPI Labs reviewed  Routine gyn exam done  [facility] recent shingrix date Mammogram utd from sept Colonoscopy 05/2015 with 10 y recall  Getting ca and D for bone health

## 2022-05-28 NOTE — Assessment & Plan Note (Signed)
Discussed how this problem influences overall health and the risks it imposes  Reviewed plan for weight loss with lower calorie diet (via better food choices and also portion control or program like weight watchers) and exercise building up to or more than 30 minutes 5 days per week including some aerobic activity   Commended great diet and exercise effort

## 2022-05-28 NOTE — Assessment & Plan Note (Signed)
Disc goals for lipids and reasons to control them Rev last labs with pt Rev low sat fat diet in detail Plan to continue lovastatin 40 mg daily  Improved LDL with better diet  Commended !

## 2022-05-28 NOTE — Assessment & Plan Note (Signed)
Reviewed health habits including diet and exercise and skin cancer prevention Reviewed appropriate screening tests for age  Also reviewed health mt list, fam hx and immunization status , as well as social and family history   See HPI Labs reviewed  Routine gyn exam done  Need to send to [facility] for most recent shingrix date Mammogram utd from sept Colonoscopy 05/2015 with 10 y recall  Getting ca and D for bone health

## 2022-05-28 NOTE — Assessment & Plan Note (Signed)
Lab Results  Component Value Date   HGBA1C 5.6 05/18/2022   disc imp of low glycemic diet and wt loss to prevent DM2  Doing well

## 2022-05-28 NOTE — Assessment & Plan Note (Signed)
Hypothyroidism  Pt has no clinical changes No change in energy level/ hair or skin/ edema and no tremor Lab Results  Component Value Date   TSH 2.15 05/18/2022    Plan to continue levothyroxine 50 mcg daily

## 2022-05-28 NOTE — Assessment & Plan Note (Signed)
Per pt stable Doing well  Under care of psychiatry - most recent labs sent

## 2022-05-28 NOTE — Assessment & Plan Note (Addendum)
Exam and pap done today  No complaints H/o vaginal dryness H/o skin infection recently  Watching lentigo near urethra with gyn

## 2022-05-28 NOTE — Patient Instructions (Addendum)
We will update your pneumonia vaccine after 72 th birthday   Drink lots of water for kidney health   Take care of yourself  Keep up the great work with diet and exercise   We will let you know when the pap returns   Use sun protection

## 2022-05-29 DIAGNOSIS — H02834 Dermatochalasis of left upper eyelid: Secondary | ICD-10-CM | POA: Diagnosis not present

## 2022-05-29 DIAGNOSIS — H02831 Dermatochalasis of right upper eyelid: Secondary | ICD-10-CM | POA: Diagnosis not present

## 2022-05-29 DIAGNOSIS — H04203 Unspecified epiphora, bilateral lacrimal glands: Secondary | ICD-10-CM | POA: Diagnosis not present

## 2022-06-04 LAB — CYTOLOGY - PAP
Comment: NEGATIVE
Diagnosis: UNDETERMINED — AB
High risk HPV: NEGATIVE

## 2022-06-05 ENCOUNTER — Ambulatory Visit: Payer: Medicare HMO | Admitting: Obstetrics and Gynecology

## 2022-06-19 ENCOUNTER — Ambulatory Visit (INDEPENDENT_AMBULATORY_CARE_PROVIDER_SITE_OTHER): Payer: Medicare HMO | Admitting: Psychology

## 2022-06-19 DIAGNOSIS — F3181 Bipolar II disorder: Secondary | ICD-10-CM | POA: Diagnosis not present

## 2022-06-19 NOTE — Progress Notes (Signed)
[facility] Counselor/Therapist Progress Note  Patient ID: [name] [name], MRN: [id number],    Date: 06/19/2022  Time Spent: 60 minutes  Treatment Type: Individual Therapy  Reported Symptoms: none  Mental Status Exam: Appearance:  Casual     Behavior: Appropriate  Motor: Normal  Speech/Language:  Normal Rate  Affect: Appropriate  Mood: normal  Thought process: normal  Thought content:   WNL  Sensory/Perceptual disturbances:   WNL  Orientation: oriented to person, place, time/date, and situation  Attention: Good  Concentration: Good  Memory: WNL  Fund of knowledge:  Good  Insight:   Good  Judgment:  Good  Impulse Control: Good   Risk Assessment: Danger to Self:  No Self-injurious Behavior: No Danger to Others: No Duty to Warn:no Physical Aggression / Violence:No  Access to Firearms a concern: No  Gang Involvement:No   Subjective: The patient attended a face-to-face individual therapy session via video visit today.  The patient agreed and consented for this session to be on caregility.  The patient was in her home alone and the therapist was in the office.  The patient is pleasant but anxious today.  The patient reports that she is feeling anxious because she is not sure about how to handle the situation with her doctor.  We talked about the circumstance and apparently she is having some problems with squamous cell carcinoma cells on her cervix and feels that her doctor is going to wait for 3 years and this is causing her a lot of anxiety.  I talked with her about how to communicate with her doctor and express her needs and discuss how to get intervention or explanation when she sees her doctor the next time.  The patient was appreciative of having the conversation and we talked about specific communication skills to help her get her needs met.    Interventions: Cognitive Behavioral Therapy  Diagnosis:Bipolar II disorder ([place])  Plan: Client  Abilities/Strengths  intelligent, insightful  Client Treatment Preferences  Outpatient individual therapy  Client Statement of Needs  "I need someone to help monitor and manage my bipolar"  Treatment Level  Outpatient individual therapy  Symptoms  Depressed or irritable mood.: (Status: improved). Diminished interest in or  enjoyment of activities.: (Status: improved). Displays a poor attention span and is easily distracted.:  (Status: improved). Exhibits an abnormally and  persistently elevated, expansive, or irritable mood with at least three symptoms of mania (i.e., inflated  self-esteem or grandiosity, decreased need for sleep, pressured speech, flight of ideas, distractibility,  excessive goal-directed activity or psychomotor agitation, excessive involvement in pleasurable, highrisk behavior).:  (Status: improved). Feelings of hopelessness, worthlessness, or inappropriate guilt.: (Status: improved). History of at least one hypomanic,  manic, or mixed mood episode.:  (Status: maintained). History of chronic or  recurrent depression for which the client has taken antidepressant medication, been hospitalized, had  outpatient treatment, or had a course of electroconvulsive therapy.:  (Status:  maintained). Lack of energy.: (Status: improved). Lacks follow-through in  projects, even though energy is very high, since behavior lacks discipline and goal-directedness.:  (Status: improved). Poor concentration and indecisiveness.:  (Status: improved). Reports flight of ideas or thoughts racing.: (Status: improved). Shows evidence of a decreased need for sleep.: (Status: improved).  Sleeplessness or hypersomnia.:  (Status: improved). Social withdrawal.: (Status: improved). Suicidal thoughts and/or gestures.:   (Status: improved). The elevated mood or irritability (mania) causes marked impairment in  occupational functioning, social activities, or relationships with others.: (Status: improved).  Verbalizes grandiose ideas  and/or persecutory beliefs.:  (Status: improved).  Problems Addressed  Bipolar Disorder - Mania, Bipolar Disorder - Depression, Bipolar Disorder - Mania, Bipolar Disorder -  Mania, Bipolar Disorder - Depression, Bipolar Disorder - Depression, Bipolar Disorder - Mania  Goals 1. Achieve controlled behavior, moderated mood, more deliberative  speech and thought process, and a stable daily activity pattern. 2. Achieve controlled behavior, moderated mood, more deliberative  speech and thought process, and a stable daily activity pattern. 3. Alleviate manic/hypomanic mood and return to previous level of  effective functioning. 4. Develop healthy cognitive patterns and beliefs about self and the world  that lead to alleviation and help prevent the relapse of  manic/hypomanic episodes. Objective Maintain a pattern of regular rhythm to daily activities. Target Date: 2023-05-08 Frequency: Monthly Progress: 80 Modality: individual Related Interventions 1. Assist the client in establishing a more routine pattern of daily activities such as sleeping,  eating, solitary and social activities, and exercise; use and review a form to schedule, assess,  and modify these activities so that they occur in a predictable rhythm every day. Objective Identify and replace thoughts and behaviors that trigger manic or depressive symptoms. Target Date: 2023-05-08 Frequency: Monthly Progress: 80 Modality: individual Related Interventions 1. Use cognitive therapy techniques to explore and educate the client about cognitive biases that  trigger his/her elevated or depressive mood (see Cognitive Therapy for Bipolar Disorder by  Lanny Hurst al.). 2. Teach the client cognitive-behavioral coping and relapse prevention skills including delaying  impulsive actions, structured scheduling of daily activities, keeping a regular sleep routine,  avoiding unrealistic goal striving, using relaxation  procedures, identifying and avoiding episode triggers such as stimulant drug use, alcohol consumption, breaking sleep routine, or exposing  self to high stress (see Cognitive Therapy for Bipolar Disorder by Lanny Hurst al.). 3. Use cognitive therapy techniques to explore and educate the client's about cognitive biases that  trigger his/her elevated or depressive mood (see Cognitive Therapy for Bipolar Disorder by  Lanny Hurst al.). Objective Discuss and resolve troubling personal and interpersonal issues. Target Date: 2023-05-08 Frequency: Monthly Progress: 60 Modality: individual Objective Participate in periodic "maintenance" sessions. Target Date: 2023-05-08 Frequency: Monthly Progress: 80 Modality: individual Related Interventions 1. Hold periodic "maintenance" sessions within the first few months after therapy to facilitate the  client's positive changes; problem-solve obstacles to improvement. 2. Hold periodic "maintenance" sessions within the first few months after therapy to facilitate the  client's positive changes; problem-solve obstacles to improvement. Objective Describe mood state, energy level, amount of control over thoughts, and sleeping pattern. Target Date: 2023-05-08 Frequency: Monthly Progress: 80 Modality: individual Related Interventions 1. Encourage the client to share his/her thoughts and feelings; express empathy and build rapport  while assessing primary cognitive, behavioral, interpersonal, or other symptoms of the mood  disorder. 2. Assess presence, severity, and impact of past and present mood episodes on social,  occupational, and interpersonal functioning; supplement with semi-structured inventory, if  desired (e.g., [place]-Asberg Depression Rating Scale, Inventory to Diagnose  Depression). 3. Encourage the client to share his/her thoughts and feelings; express empathy, and build rapport  while assessing primary cognitive, behavioral, interpersonal, or other  symptoms of the mood  disorder. 4. Assess presence, severity, and impact of past and present mood episodes including mania (i.e.,  pressured speech, impulsive behavior, euphoric mood, flight of ideas, reduced need for sleep,  inflated self-esteem, and high energy) on social, occupational, and interpersonal functioning;  supplement with semi-structured inventory, if desired (e.g., Young Mania Rating Scale; the  Clinical Monitoring Form); re-administer as indicated to assess treatment response. 5. Develop healthy cognitive patterns and beliefs about self and the world  that lead to alleviation and help prevent the relapse of mood  episodes. 6. Normalize energy level and return to usual activities, good judgment,  stable mood, more realistic expectations, and goal-directed behavior. 7. Normalize energy level and return to usual activities, good judgment,  stable mood, more realistic expectations, and goal-directed behavior. Diagnosis 296.52 (Bipolar I disorder, most recent episode (or current) depressed, moderate)  Medications  1. Ambien (Dosage: 37m qhs)  2. Klonopin (Dosage: 138mTID)  3. Olanzapine (Dosage: 5 mg every other day and not on weekends)  4. Prozac (Dosage: 20 mg every other day and not on weekends)  5. Wellbutrin (Dosage: 450 mg qd)  Conditions For Discharge Achievement of treatment goals and objectives  The patient approved this treatment plan and is making progress towards goals.  Ayame Rena G Torrance Stockley, LCSW                  Lanore Renderos G Terrius Gentile, LCSW               Bertha Earwood G Willem Klingensmith, LCSW               Amamda Curbow G Kiari Hosmer, LCSW               Demari Gales G Dagon Budai, LCSW

## 2022-06-25 ENCOUNTER — Ambulatory Visit (INDEPENDENT_AMBULATORY_CARE_PROVIDER_SITE_OTHER): Payer: Medicare HMO | Admitting: Family Medicine

## 2022-06-25 ENCOUNTER — Encounter: Payer: Self-pay | Admitting: Family Medicine

## 2022-06-25 DIAGNOSIS — N898 Other specified noninflammatory disorders of vagina: Secondary | ICD-10-CM | POA: Insufficient documentation

## 2022-06-25 DIAGNOSIS — B3731 Acute candidiasis of vulva and vagina: Secondary | ICD-10-CM | POA: Diagnosis not present

## 2022-06-25 DIAGNOSIS — R87610 Atypical squamous cells of undetermined significance on cytologic smear of cervix (ASC-US): Secondary | ICD-10-CM | POA: Insufficient documentation

## 2022-06-25 LAB — POCT WET PREP (WET MOUNT): Trichomonas Wet Prep HPF POC: ABSENT

## 2022-06-25 MED ORDER — FLUCONAZOLE 150 MG PO TABS: 150.0000 mg | ORAL_TABLET | Freq: Once | ORAL | 0 refills | Status: AC

## 2022-06-25 NOTE — Patient Instructions (Addendum)
Take diflucan pill once for yeast  Hold your lovastatin that day   Don't use soap on the vaginal area if you can help it  Just flush with water  Gentle wash cloth with water only is ok   We will repeat pap with HPV screen in 3 years

## 2022-06-25 NOTE — Assessment & Plan Note (Signed)
ASCUS with neg HPV on pap June 2023 Not sexually active No new partners and no h/o abn pap in lifetime  Will plan re check pap/HPV in 3 years per guidelines Handout given

## 2022-06-25 NOTE — Progress Notes (Signed)
Subjective:    Patient ID: [name] [name], female    DOB: [birth year], 66 y.o.   MRN: [id number]  HPI Pt presents to discuss pap result   Wt Readings from Last 3 Encounters:  06/25/22 216 lb 3.2 oz (98.1 kg)  05/28/22 219 lb 6.4 oz (99.5 kg)  05/25/22 219 lb (99.3 kg)   42.22 kg/m   Had pap 6/19  Ascus  With neg HPV   She has a little discharge  Clear usually /no more than that  Not a lot in her underwear  Scant itching here and there - after shower  Has a baseline odor (not new at all)     Neg pap 2020 Neg pap 2-18   No new sexual partners Is not sexually active  Has never had an abnormal pap   Had a brown spot-going back to gyn for re check of that at 1 year in march   Wet prep today- scant hyphae  Results for orders placed or performed in visit on 06/25/22  POCT Wet Prep [facility])  Result Value Ref Range   Source Wet Prep POC vaginal    WBC, Wet Prep HPF POC mod    Bacteria Wet Prep HPF POC Few Few   BACTERIA WET PREP MORPHOLOGY POC     Clue Cells Wet Prep HPF POC None None   Clue Cells Wet Prep Whiff POC     Yeast Wet Prep HPF POC Few (A) None   KOH Wet Prep POC Few (A) None   Trichomonas Wet Prep HPF POC Absent Absent     Patient Active Problem List   Diagnosis Date Noted   ASCUS of cervix with negative high risk HPV 06/25/2022   Vaginal discharge 06/25/2022   Yeast vaginitis 06/25/2022   Osteoarthritis of knee 05/25/2022   Hemorrhoid 04/25/2022   Positive self-administered antigen test for COVID-19 02/19/2022   Estrogen deficiency 01/02/2022   Vaginal dryness, menopausal 05/22/2021   Bipolar disorder, in partial remission, most recent episode mixed ([place]) 02/25/2020   Elevated glucose level 03/03/2018   Low back pain 12/23/2017   Atrophic vaginitis 07/08/2017   Cervical stenosis (uterine cervix) 01/03/2017   Hearing loss 10/25/2015   Routine general medical examination at a health care facility 10/17/2015   Encounter for routine  gynecological examination 10/22/2014   Elevated serum creatinine 10/22/2014   Colon cancer screening 10/16/2013   Encounter for Medicare annual wellness exam 10/04/2011   METATARSALGIA 12/01/2009   Enthesopathy of ankle and tarsus 12/01/2009   POSTMENOPAUSAL STATUS 09/30/2009   ARTHRITIS, RIGHT HIP 03/10/2009   DEGENERATIVE DISC DISEASE, LUMBAR SPINE 03/10/2009   DDD (degenerative disc disease), lumbosacral 03/10/2009   ONYCHOMYCOSIS 09/17/2008   Hypothyroidism 08/05/2007   Morbid obesity ([place]) 08/05/2007   Bipolar disorder ([place]) 08/05/2007   ALLERGIC RHINITIS 08/05/2007   IBS 08/05/2007   Esophageal reflux 08/05/2007   HYPERCHOLESTEROLEMIA, PURE 07/07/2007   Past Medical History:  Diagnosis Date   Actinic keratosis 03/29/2021   R central upper lip   Allergic rhinitis    Atypical mole 12/26/2021   spinal upper back, exc 01/15/22   Basal cell carcinoma 10/16/2010   R lower pretibia    Bipolar 1 disorder ([facility])    Fissure, anal    GERD (gastroesophageal reflux disease)    History of basal cell carcinoma excision    ON FACE   History of squamous cell carcinoma excision    ON RIGHT LEG   Hyperlipidemia    Hypothyroidism  IBS (irritable bowel syndrome)    Liver cyst    Mallet finger of left hand    small finger   Past Surgical History:  Procedure Laterality Date   CARDIAC CATHETERIZATION  04/1999   per pt normal   CARDIOVASCULAR STRESS TEST  02-23-2011  dr Chrissie Noa fath [facility] clinic)   normal nuclear study/  no ischemia/  ef 64%   CATARACT EXTRACTION W/ INTRAOCULAR LENS  IMPLANT, BILATERAL  2003   Bil   CESAREAN SECTION  1984   CLOSED REDUCTION FINGER WITH PERCUTANEOUS PINNING Left 04/22/2014   Procedure: LEFT SMALL FINGER CLOSED REDUCTION PINNING;  Surgeon: Linna Hoff, MD;  Location: [facility];  Service: Orthopedics;  Laterality: Left;   COLONOSCOPY  11/2002   LAPAROSCOPIC CHOLECYSTECTOMY  03-18-2000   TRANSTHORACIC ECHOCARDIOGRAM  02-23-2011    normal lvf/  ef 58%/  mild rve/   mild lae/  mild  mr  & tr/  mild pulmonary htn   WISDOM TOOTH EXTRACTION     Social History   Tobacco Use   Smoking status: Never   Smokeless tobacco: Never  Vaping Use   Vaping Use: Never used  Substance Use Topics   Alcohol use: Yes    Alcohol/week: 0.0 standard drinks of alcohol    Comment: wine- rarely   Drug use: No   Family History  Problem Relation Age of Onset   Stroke Mother    Heart disease Mother    Hypertension Father    Diabetes Father    Hyperlipidemia Father    Diabetes Brother    Allergies  Allergen Reactions   Lidocaine Swelling    REACTION: swelling ALL CAINE DRUGS EXCEPT: marcaine & sensercaine Other reaction(s): Edema   Xylocaine [Lidocaine Hcl] Swelling    ALLERGIC TO ALL CAINES EXCEPT SENSORCAINE , AND MARCAINE   Amantadine    Codeine Nausea And Vomiting    REACTION: vomits blood   Lithium Swelling   Tegretol [Carbamazepine] Other (See Comments)    REACTION: skin crawls   Current Outpatient Medications on File Prior to Visit  Medication Sig Dispense Refill   aspirin 81 MG tablet Take 81 mg by mouth daily.     BIOTIN PO Take 1 tablet by mouth daily.     buPROPion (WELLBUTRIN XL) 150 MG 24 hr tablet Take three tablets by mouth every morning     cetirizine (ZYRTEC ALLERGY) 10 MG tablet Zyrtec 10 mg tablet  Take 1 tablet every day by oral route.     Cholecalciferol (VITAMIN D) 2000 UNITS CAPS Take 1 capsule by mouth daily.     clonazePAM (KLONOPIN) 1 MG tablet Klonopin 1 mg tablet  Take 1 tablet 3 times a day by oral route.     Coenzyme Q10 (COQ10 PO) Take 100 mg by mouth daily.     cyclobenzaprine (FLEXERIL) 10 MG tablet      fluticasone (FLONASE) 50 MCG/ACT nasal spray Place 2 sprays into both nostrils daily as needed. 48 g 3   hydrocortisone (ANUSOL-HC) 2.5 % rectal cream Apply 1 application. topically 2 (two) times daily. To affected area twice daily (around anus/ hemorrhoid) 30 g 0   lamoTRIgine  (LAMICTAL) 100 MG tablet Take 100 mg by mouth daily.     levothyroxine (SYNTHROID) 50 MCG tablet Take 1 tablet (50 mcg total) by mouth daily before breakfast. 90 tablet 0   lovastatin (MEVACOR) 40 MG tablet TAKE 1 TABLET EVERY DAY 90 tablet 0   Probiotic Product (PROBIOTIC ADVANCED PO)  Take by mouth.     tiZANidine (ZANAFLEX) 4 MG tablet Take 1 tablet by mouth at bedtime as needed.  0   zolpidem (AMBIEN) 10 MG tablet Take 10 mg by mouth at bedtime.     No current facility-administered medications on file prior to visit.     Review of Systems  Constitutional:  Negative for activity change, appetite change, fatigue, fever and unexpected weight change.  HENT:  Negative for congestion, ear pain, rhinorrhea, sinus pressure and sore throat.   Eyes:  Negative for pain, redness and visual disturbance.  Respiratory:  Negative for cough, shortness of breath and wheezing.   Cardiovascular:  Negative for chest pain and palpitations.  Gastrointestinal:  Negative for abdominal pain, blood in stool, constipation and diarrhea.  Endocrine: Negative for polydipsia and polyuria.  Genitourinary:  Positive for vaginal discharge. Negative for dysuria, frequency, genital sores and urgency.  Musculoskeletal:  Negative for arthralgias, back pain and myalgias.  Skin:  Negative for pallor and rash.  Allergic/Immunologic: Negative for environmental allergies.  Neurological:  Negative for dizziness, syncope and headaches.  Hematological:  Negative for adenopathy. Does not bruise/bleed easily.  Psychiatric/Behavioral:  Negative for decreased concentration and dysphoric mood. The patient is not nervous/anxious.        Objective:   Physical Exam Constitutional:      General: She is not in acute distress.    Appearance: Normal appearance. She is obese. She is not ill-appearing.  Eyes:     General:        Right eye: No discharge.        Left eye: No discharge.     Conjunctiva/sclera: Conjunctivae normal.      Pupils: Pupils are equal, round, and reactive to light.  Cardiovascular:     Rate and Rhythm: Normal rate and regular rhythm.     Heart sounds: Normal heart sounds.  Pulmonary:     Effort: Pulmonary effort is normal. No respiratory distress.     Breath sounds: Normal breath sounds.  Abdominal:     Palpations: There is no mass.     Tenderness: There is no abdominal tenderness.     Comments: No suprapubic tenderness or fullness    Genitourinary:    Comments: Brown flat macular lesion on R upper labia minora (baseline) Scant clear discharge  No vulvar swelling or excoriation  No odor  Some mild vaginal dryness Wet prep obtained  Musculoskeletal:     Cervical back: Neck supple.  Lymphadenopathy:     Cervical: No cervical adenopathy.  Skin:    General: Skin is warm and dry.     Findings: No rash.  Neurological:     Mental Status: She is alert.  Psychiatric:        Mood and Affect: Mood normal.           Assessment & Plan:   Problem List Items Addressed This Visit       Genitourinary   Yeast vaginitis    Scant yeast hyphae on wet prep  Pt c/o mild discharge and occ itch tx with diflucan 150 mg once (will hold lovastatin that day)   Avoid soap in perineal area due to risk of irritation  Keep dry if possible  Update if not starting to improve in a week or if worsening        Relevant Medications   fluconazole (DIFLUCAN) 150 MG tablet     Other   ASCUS of cervix with negative high risk HPV  ASCUS with neg HPV on pap June 2023 Not sexually active No new partners and no h/o abn pap in lifetime  Will plan re check pap/HPV in 3 years per guidelines Handout given        Vaginal discharge   Relevant Orders   POCT Wet Prep [facility]) (Completed)

## 2022-06-25 NOTE — Assessment & Plan Note (Signed)
Scant yeast hyphae on wet prep  Pt c/o mild discharge and occ itch tx with diflucan 150 mg once (will hold lovastatin that day)   Avoid soap in perineal area due to risk of irritation  Keep dry if possible  Update if not starting to improve in a week or if worsening

## 2022-07-24 ENCOUNTER — Ambulatory Visit (INDEPENDENT_AMBULATORY_CARE_PROVIDER_SITE_OTHER): Payer: Medicare HMO | Admitting: Psychology

## 2022-07-24 DIAGNOSIS — F3181 Bipolar II disorder: Secondary | ICD-10-CM

## 2022-07-24 NOTE — Progress Notes (Signed)
[facility] Counselor/Therapist Progress Note  Patient ID: [name] [name], MRN: [id number],    Date: 07/24/2022  Time Spent: 30 minutes  Treatment Type: Individual Therapy  Reported Symptoms: none  Mental Status Exam: Appearance:  Casual     Behavior: Appropriate  Motor: Normal  Speech/Language:  Normal Rate  Affect: Appropriate  Mood: normal  Thought process: normal  Thought content:   WNL  Sensory/Perceptual disturbances:   WNL  Orientation: oriented to person, place, time/date, and situation  Attention: Good  Concentration: Good  Memory: WNL  Fund of knowledge:  Good  Insight:   Good  Judgment:  Good  Impulse Control: Good   Risk Assessment: Danger to Self:  No Self-injurious Behavior: No Danger to Others: No Duty to Warn:no Physical Aggression / Violence:No  Access to Firearms a concern: No  Gang Involvement:No   Subjective: The patient attended a face-to-face individual therapy session via video visit today.  The patient agreed and consented for this session to be on caregility.  The patient was in her home alone and the therapist was in the office.  The patient is pleasant and cooperative.  The patient reports that things have been going very well for her.  She states that she is going to [place] and about 2 weeks for vacation.  She also has plans to go to [place] again to see her daughter.  She reports that she was able to have a conversation with her daughter that she needed to have  with her about possibly having bipolar disorder and her daughter was able to go to a therapist and the therapist is not sure that she has bipolar disorder at this point.  I told the patient that it was just good that she was able to get someone to talk to and help her figure that out.  The patient felt good about having the conversation as she was anxious about it prior to having it.  The patient is exercising and going to the doctor like she needs to do and doing very well  otherwise. Interventions: Cognitive Behavioral Therapy  Diagnosis:No diagnosis found.  Plan: Client Abilities/Strengths  intelligent, insightful  Client Treatment Preferences  Outpatient individual therapy  Client Statement of Needs  "I need someone to help monitor and manage my bipolar"  Treatment Level  Outpatient individual therapy  Symptoms  Depressed or irritable mood.: (Status: improved). Diminished interest in or  enjoyment of activities.: (Status: improved). Displays a poor attention span and is easily distracted.:  (Status: improved). Exhibits an abnormally and  persistently elevated, expansive, or irritable mood with at least three symptoms of mania (i.e., inflated  self-esteem or grandiosity, decreased need for sleep, pressured speech, flight of ideas, distractibility,  excessive goal-directed activity or psychomotor agitation, excessive involvement in pleasurable, highrisk behavior).:  (Status: improved). Feelings of hopelessness, worthlessness, or inappropriate guilt.: (Status: improved). History of at least one hypomanic,  manic, or mixed mood episode.:  (Status: maintained). History of chronic or  recurrent depression for which the client has taken antidepressant medication, been hospitalized, had  outpatient treatment, or had a course of electroconvulsive therapy.:  (Status:  maintained). Lack of energy.: (Status: improved). Lacks follow-through in  projects, even though energy is very high, since behavior lacks discipline and goal-directedness.:  (Status: improved). Poor concentration and indecisiveness.:  (Status: improved). Reports flight of ideas or thoughts racing.: (Status: improved). Shows evidence of a decreased need for sleep.: (Status: improved).  Sleeplessness or hypersomnia.:  (Status: improved). Social withdrawal.: (Status: improved).  Suicidal thoughts and/or gestures.:   (Status: improved). The elevated mood or irritability (mania) causes marked impairment in   occupational functioning, social activities, or relationships with others.: (Status: improved). Verbalizes grandiose ideas and/or persecutory beliefs.:  (Status: improved).  Problems Addressed  Bipolar Disorder - Mania, Bipolar Disorder - Depression, Bipolar Disorder - Mania, Bipolar Disorder -  Mania, Bipolar Disorder - Depression, Bipolar Disorder - Depression, Bipolar Disorder - Mania  Goals 1. Achieve controlled behavior, moderated mood, more deliberative  speech and thought process, and a stable daily activity pattern. 2. Achieve controlled behavior, moderated mood, more deliberative  speech and thought process, and a stable daily activity pattern. 3. Alleviate manic/hypomanic mood and return to previous level of  effective functioning. 4. Develop healthy cognitive patterns and beliefs about self and the world  that lead to alleviation and help prevent the relapse of  manic/hypomanic episodes. Objective Maintain a pattern of regular rhythm to daily activities. Target Date: 2023-05-08 Frequency: Monthly Progress: 80 Modality: individual Related Interventions 1. Assist the client in establishing a more routine pattern of daily activities such as sleeping,  eating, solitary and social activities, and exercise; use and review a form to schedule, assess,  and modify these activities so that they occur in a predictable rhythm every day. Objective Identify and replace thoughts and behaviors that trigger manic or depressive symptoms. Target Date: 2023-05-08 Frequency: Monthly Progress: 80 Modality: individual Related Interventions 1. Use cognitive therapy techniques to explore and educate the client about cognitive biases that  trigger his/her elevated or depressive mood (see Cognitive Therapy for Bipolar Disorder by  Lanny Hurst al.). 2. Teach the client cognitive-behavioral coping and relapse prevention skills including delaying  impulsive actions, structured scheduling of daily  activities, keeping a regular sleep routine,  avoiding unrealistic goal striving, using relaxation procedures, identifying and avoiding episode triggers such as stimulant drug use, alcohol consumption, breaking sleep routine, or exposing  self to high stress (see Cognitive Therapy for Bipolar Disorder by Lanny Hurst al.). 3. Use cognitive therapy techniques to explore and educate the client's about cognitive biases that  trigger his/her elevated or depressive mood (see Cognitive Therapy for Bipolar Disorder by  Lanny Hurst al.). Objective Discuss and resolve troubling personal and interpersonal issues. Target Date: 2023-05-08 Frequency: Monthly Progress: 60 Modality: individual Objective Participate in periodic "maintenance" sessions. Target Date: 2023-05-08 Frequency: Monthly Progress: 80 Modality: individual Related Interventions 1. Hold periodic "maintenance" sessions within the first few months after therapy to facilitate the  client's positive changes; problem-solve obstacles to improvement. 2. Hold periodic "maintenance" sessions within the first few months after therapy to facilitate the  client's positive changes; problem-solve obstacles to improvement. Objective Describe mood state, energy level, amount of control over thoughts, and sleeping pattern. Target Date: 2023-05-08 Frequency: Monthly Progress: 80 Modality: individual Related Interventions 1. Encourage the client to share his/her thoughts and feelings; express empathy and build rapport  while assessing primary cognitive, behavioral, interpersonal, or other symptoms of the mood  disorder. 2. Assess presence, severity, and impact of past and present mood episodes on social,  occupational, and interpersonal functioning; supplement with semi-structured inventory, if  desired (e.g., Montgomery-Asberg Depression Rating Scale, Inventory to Diagnose  Depression). 3. Encourage the client to share his/her thoughts and feelings; express  empathy, and build rapport  while assessing primary cognitive, behavioral, interpersonal, or other symptoms of the mood  disorder. 4. Assess presence, severity, and impact of past and present mood episodes including mania (i.e.,  pressured speech, impulsive behavior, euphoric mood,  flight of ideas, reduced need for sleep,  inflated self-esteem, and high energy) on social, occupational, and interpersonal functioning;  supplement with semi-structured inventory, if desired (e.g., Young Mania Rating Scale; the  Clinical Monitoring Form); re-administer as indicated to assess treatment response. 5. Develop healthy cognitive patterns and beliefs about self and the world  that lead to alleviation and help prevent the relapse of mood  episodes. 6. Normalize energy level and return to usual activities, good judgment,  stable mood, more realistic expectations, and goal-directed behavior. 7. Normalize energy level and return to usual activities, good judgment,  stable mood, more realistic expectations, and goal-directed behavior. Diagnosis 296.52 (Bipolar I disorder, most recent episode (or current) depressed, moderate)  Medications  1. Ambien (Dosage: '10mg'$  qhs)  2. Klonopin (Dosage: '1mg'$  TID)  3. Olanzapine (Dosage: 5 mg every other day and not on weekends)  4. Prozac (Dosage: 20 mg every other day and not on weekends)  5. Wellbutrin (Dosage: 450 mg qd)  Conditions For Discharge Achievement of treatment goals and objectives  The patient approved this treatment plan and is making progress towards goals.  Gillis Boardley G Labarron Durnin, LCSW                  Maeleigh Buschman G Kristofer Schaffert, LCSW               Lawrance Wiedemann G Rachna Schonberger, LCSW               Keidy Thurgood G Jj Enyeart, LCSW               Judith Demps G Roshni Burbano, LCSW               Lynde Ludwig G Nasiyah Laverdiere, LCSW

## 2022-08-02 DIAGNOSIS — H02831 Dermatochalasis of right upper eyelid: Secondary | ICD-10-CM | POA: Diagnosis not present

## 2022-08-02 DIAGNOSIS — Z01818 Encounter for other preprocedural examination: Secondary | ICD-10-CM | POA: Diagnosis not present

## 2022-08-02 DIAGNOSIS — H02834 Dermatochalasis of left upper eyelid: Secondary | ICD-10-CM | POA: Diagnosis not present

## 2022-08-10 HISTORY — PX: RECONSTRUCTION OF EYELID: SHX6576

## 2022-08-22 DIAGNOSIS — H02831 Dermatochalasis of right upper eyelid: Secondary | ICD-10-CM | POA: Diagnosis not present

## 2022-08-22 DIAGNOSIS — H02834 Dermatochalasis of left upper eyelid: Secondary | ICD-10-CM | POA: Diagnosis not present

## 2022-08-24 ENCOUNTER — Other Ambulatory Visit: Payer: Self-pay | Admitting: Family Medicine

## 2022-08-24 DIAGNOSIS — Z1231 Encounter for screening mammogram for malignant neoplasm of breast: Secondary | ICD-10-CM

## 2022-08-28 ENCOUNTER — Ambulatory Visit (INDEPENDENT_AMBULATORY_CARE_PROVIDER_SITE_OTHER): Payer: Medicare HMO | Admitting: Psychology

## 2022-08-28 DIAGNOSIS — F3181 Bipolar II disorder: Secondary | ICD-10-CM

## 2022-08-28 NOTE — Progress Notes (Addendum)
[facility] Counselor/Therapist Progress Note  Patient ID: [name] [name], MRN: [id number],    Date: 08/28/2022  Time Spent: 45 minutes  Treatment Type: Individual Therapy  Reported Symptoms: none  Mental Status Exam: Appearance:  Casual     Behavior: Appropriate  Motor: Normal  Speech/Language:  Normal Rate  Affect: Appropriate  Mood: normal  Thought process: normal  Thought content:   WNL  Sensory/Perceptual disturbances:   WNL  Orientation: oriented to person, place, time/date, and situation  Attention: Good  Concentration: Good  Memory: WNL  Fund of knowledge:  Good  Insight:   Good  Judgment:  Good  Impulse Control: Good   Risk Assessment: Danger to Self:  No Self-injurious Behavior: No Danger to Others: No Duty to Warn:no Physical Aggression / Violence:No  Access to Firearms a concern: No  Gang Involvement:No   Subjective: The patient attended a face-to-face individual therapy session via video visit today.  The patient agreed and consented for this session to be on caregility.  The patient was in her home alone and the therapist was in the office.  The patient is pleasant and cooperative.  The patient reports that she just had surgery on her eyes and she did look like she had just had surgery.  The patient reports that she feels like she has been doing well emotionally.  She reports that she and her husband went on a trip out to the [facility] [employer].  She said that it was a wonderful trip and they had a great time.  She also reports that they are going to [facility] in the coming weeks to see her daughter.  The patient reports that she has been managing things well and is taking her medication as provided.  She states that she will be happy to get back to exercising because this helps her with her symptoms of bipolar disorder significantly.  We will continue to monitor patient on a regular basis as she has a chronic mental  illness.  Interventions: Cognitive Behavioral Therapy  Diagnosis:Bipolar II disorder ([place]) - forgot to add diagnosis  Plan: Client Abilities/Strengths  intelligent, insightful  Client Treatment Preferences  Outpatient individual therapy  Client Statement of Needs  "I need someone to help monitor and manage my bipolar"  Treatment Level  Outpatient individual therapy  Symptoms  Depressed or irritable mood.: (Status: improved). Diminished interest in or  enjoyment of activities.: (Status: improved). Displays a poor attention span and is easily distracted.:  (Status: improved). Exhibits an abnormally and  persistently elevated, expansive, or irritable mood with at least three symptoms of mania (i.e., inflated  self-esteem or grandiosity, decreased need for sleep, pressured speech, flight of ideas, distractibility,  excessive goal-directed activity or psychomotor agitation, excessive involvement in pleasurable, highrisk behavior).:  (Status: improved). Feelings of hopelessness, worthlessness, or inappropriate guilt.: (Status: improved). History of at least one hypomanic,  manic, or mixed mood episode.:  (Status: maintained). History of chronic or  recurrent depression for which the client has taken antidepressant medication, been hospitalized, had  outpatient treatment, or had a course of electroconvulsive therapy.:  (Status:  maintained). Lack of energy.: (Status: improved). Lacks follow-through in  projects, even though energy is very high, since behavior lacks discipline and goal-directedness.:  (Status: improved). Poor concentration and indecisiveness.:  (Status: improved). Reports flight of ideas or thoughts racing.: (Status: improved). Shows evidence of a decreased need for sleep.: (Status: improved).  Sleeplessness or hypersomnia.:  (Status: improved). Social withdrawal.: (  Status: improved). Suicidal thoughts and/or gestures.:   (Status: improved). The elevated mood or irritability  (mania) causes marked impairment in  occupational functioning, social activities, or relationships with others.: (Status: improved). Verbalizes grandiose ideas and/or persecutory beliefs.:  (Status: improved).  Problems Addressed  Bipolar Disorder - Mania, Bipolar Disorder - Depression, Bipolar Disorder - Mania, Bipolar Disorder -  Mania, Bipolar Disorder - Depression, Bipolar Disorder - Depression, Bipolar Disorder - Mania  Goals 1. Achieve controlled behavior, moderated mood, more deliberative  speech and thought process, and a stable daily activity pattern. 2. Achieve controlled behavior, moderated mood, more deliberative  speech and thought process, and a stable daily activity pattern. 3. Alleviate manic/hypomanic mood and return to previous level of  effective functioning. 4. Develop healthy cognitive patterns and beliefs about self and the world  that lead to alleviation and help prevent the relapse of  manic/hypomanic episodes. Objective Maintain a pattern of regular rhythm to daily activities. Target Date: 2023-05-08 Frequency: Monthly Progress: 80 Modality: individual Related Interventions 1. Assist the client in establishing a more routine pattern of daily activities such as sleeping,  eating, solitary and social activities, and exercise; use and review a form to schedule, assess,  and modify these activities so that they occur in a predictable rhythm every day. Objective Identify and replace thoughts and behaviors that trigger manic or depressive symptoms. Target Date: 2023-05-08 Frequency: Monthly Progress: 80 Modality: individual Related Interventions 1. Use cognitive therapy techniques to explore and educate the client about cognitive biases that  trigger his/her elevated or depressive mood (see Cognitive Therapy for Bipolar Disorder by  Lanny Hurst al.). 2. Teach the client cognitive-behavioral coping and relapse prevention skills including delaying  impulsive actions,  structured scheduling of daily activities, keeping a regular sleep routine,  avoiding unrealistic goal striving, using relaxation procedures, identifying and avoiding episode triggers such as stimulant drug use, alcohol consumption, breaking sleep routine, or exposing  self to high stress (see Cognitive Therapy for Bipolar Disorder by Lanny Hurst al.). 3. Use cognitive therapy techniques to explore and educate the client's about cognitive biases that  trigger his/her elevated or depressive mood (see Cognitive Therapy for Bipolar Disorder by  Lanny Hurst al.). Objective Discuss and resolve troubling personal and interpersonal issues. Target Date: 2023-05-08 Frequency: Monthly Progress: 60 Modality: individual Objective Participate in periodic "maintenance" sessions. Target Date: 2023-05-08 Frequency: Monthly Progress: 80 Modality: individual Related Interventions 1. Hold periodic "maintenance" sessions within the first few months after therapy to facilitate the  client's positive changes; problem-solve obstacles to improvement. 2. Hold periodic "maintenance" sessions within the first few months after therapy to facilitate the  client's positive changes; problem-solve obstacles to improvement. Objective Describe mood state, energy level, amount of control over thoughts, and sleeping pattern. Target Date: 2023-05-08 Frequency: Monthly Progress: 80 Modality: individual Related Interventions 1. Encourage the client to share his/her thoughts and feelings; express empathy and build rapport  while assessing primary cognitive, behavioral, interpersonal, or other symptoms of the mood  disorder. 2. Assess presence, severity, and impact of past and present mood episodes on social,  occupational, and interpersonal functioning; supplement with semi-structured inventory, if  desired (e.g., Montgomery-Asberg Depression Rating Scale, Inventory to Diagnose  Depression). 3. Encourage the client to share his/her  thoughts and feelings; express empathy, and build rapport  while assessing primary cognitive, behavioral, interpersonal, or other symptoms of the mood  disorder. 4. Assess presence, severity, and impact of past and present mood episodes including mania (i.e.,  pressured speech, impulsive behavior,  euphoric mood, flight of ideas, reduced need for sleep,  inflated self-esteem, and high energy) on social, occupational, and interpersonal functioning;  supplement with semi-structured inventory, if desired (e.g., Young Mania Rating Scale; the  Clinical Monitoring Form); re-administer as indicated to assess treatment response. 5. Develop healthy cognitive patterns and beliefs about self and the world  that lead to alleviation and help prevent the relapse of mood  episodes. 6. Normalize energy level and return to usual activities, good judgment,  stable mood, more realistic expectations, and goal-directed behavior. 7. Normalize energy level and return to usual activities, good judgment,  stable mood, more realistic expectations, and goal-directed behavior. Diagnosis 296.52 (Bipolar I disorder, most recent episode (or current) depressed, moderate)  Medications  1. Ambien (Dosage: '10mg'$  qhs)  2. Klonopin (Dosage: '1mg'$  TID)  3. Olanzapine (Dosage: 5 mg every other day and not on weekends)  4. Prozac (Dosage: 20 mg every other day and not on weekends)  5. Wellbutrin (Dosage: 450 mg qd)  Conditions For Discharge Achievement of treatment goals and objectives  The patient approved this treatment plan and is making progress towards goals.  Catrell Morrone G Verl Whitmore, LCSW

## 2022-09-11 ENCOUNTER — Encounter: Payer: Self-pay | Admitting: Family Medicine

## 2022-09-11 ENCOUNTER — Ambulatory Visit (INDEPENDENT_AMBULATORY_CARE_PROVIDER_SITE_OTHER): Payer: Medicare HMO | Admitting: Family Medicine

## 2022-09-11 VITALS — BP 128/84 | HR 62 | Temp 98.2°F | Ht 60.0 in | Wt 232.2 lb

## 2022-09-11 DIAGNOSIS — K219 Gastro-esophageal reflux disease without esophagitis: Secondary | ICD-10-CM | POA: Diagnosis not present

## 2022-09-11 MED ORDER — FAMOTIDINE 20 MG PO TABS: 20.0000 mg | ORAL_TABLET | Freq: Two times a day (BID) | ORAL | 3 refills | Status: DC

## 2022-09-11 NOTE — Assessment & Plan Note (Signed)
Was off generic prevacid for a year and recently heartburn got much worse (every night) regardless of what she eats  Wt gain noted - this could contribute  Disc GERD and triggers and handouts given inst to stop eating after dinner or right before she lies down   Px pepcid 20 mg bid  If not helpful will update and then consider return to generic prevacid (disc long term risks)  inst to watch diet/work on wt loss

## 2022-09-11 NOTE — Progress Notes (Signed)
Subjective:    Patient ID: [name] [name], female    DOB: 1956-07-16, 66 y.o.   MRN: [id number]  HPI Pt presents with c/o heartburn   Wt Readings from Last 3 Encounters:  09/11/22 232 lb 3.2 oz (105.3 kg)  06/25/22 216 lb 3.2 oz (98.1 kg)  05/28/22 219 lb 6.4 oz (99.5 kg)   45.35 kg/m  Heartburn for 3 weeks- every night  Pain in upper stomach and chest   Started grilling more  Over the summer-had a lot of tomato sandwiches  Occ bbq sauce  Does not know what flares her   Gets acid in throat  No throat clearing or hoarse     Has taken big bottle of tums   Used to take prevacid in the past  Stopped it for a year -did well up until recentlyu   Never had an endoscopy before   No big diet change  Eats fruit and vegetables  Still working out   Had eye surgery 2 weeks ago - just started working out since then  Is unable to get in to  Lid surgery   Her buproprion dose went up recently  Mood has been ok   Patient Active Problem List   Diagnosis Date Noted   ASCUS of cervix with negative high risk HPV 06/25/2022   Vaginal discharge 06/25/2022   Yeast vaginitis 06/25/2022   Osteoarthritis of knee 05/25/2022   Hemorrhoid 04/25/2022   Positive self-administered antigen test for COVID-19 02/19/2022   Estrogen deficiency 01/02/2022   Vaginal dryness, menopausal 05/22/2021   Bipolar disorder, in partial remission, most recent episode mixed ([place]) 02/25/2020   Elevated glucose level 03/03/2018   Low back pain 12/23/2017   Atrophic vaginitis 07/08/2017   Cervical stenosis (uterine cervix) 01/03/2017   Hearing loss 10/25/2015   Routine general medical examination at a health care facility 10/17/2015   Encounter for routine gynecological examination 10/22/2014   Elevated serum creatinine 10/22/2014   Colon cancer screening 10/16/2013   Encounter for Medicare annual wellness exam 10/04/2011   METATARSALGIA 12/01/2009   Enthesopathy of ankle and tarsus 12/01/2009    POSTMENOPAUSAL STATUS 09/30/2009   ARTHRITIS, RIGHT HIP 03/10/2009   DEGENERATIVE DISC DISEASE, LUMBAR SPINE 03/10/2009   DDD (degenerative disc disease), lumbosacral 03/10/2009   ONYCHOMYCOSIS 09/17/2008   Hypothyroidism 08/05/2007   Morbid obesity ([place]) 08/05/2007   Bipolar disorder ([place]) 08/05/2007   ALLERGIC RHINITIS 08/05/2007   IBS 08/05/2007   GERD (gastroesophageal reflux disease) 08/05/2007   HYPERCHOLESTEROLEMIA, PURE 07/07/2007   Past Medical History:  Diagnosis Date   Actinic keratosis 03/29/2021   R central upper lip   Allergic rhinitis    Atypical mole 12/26/2021   spinal upper back, exc 01/15/22   Basal cell carcinoma 10/16/2010   R lower pretibia    Bipolar 1 disorder ([facility])    Fissure, anal    GERD (gastroesophageal reflux disease)    History of basal cell carcinoma excision    ON FACE   History of squamous cell carcinoma excision    ON RIGHT LEG   Hyperlipidemia    Hypothyroidism    IBS (irritable bowel syndrome)    Liver cyst    Mallet finger of left hand    small finger   Past Surgical History:  Procedure Laterality Date   CARDIAC CATHETERIZATION  04/1999   per pt normal   CARDIOVASCULAR STRESS TEST  02-23-2011  dr Chrissie Noa [facility] clinic)   normal nuclear study/  no ischemia/  ef 64%   CATARACT EXTRACTION W/ INTRAOCULAR LENS  IMPLANT, BILATERAL  2003   Bil   CESAREAN SECTION  1984   CLOSED REDUCTION FINGER WITH PERCUTANEOUS PINNING Left 04/22/2014   Procedure: LEFT SMALL FINGER CLOSED REDUCTION PINNING;  Surgeon: Linna Hoff, MD;  Location: [facility];  Service: Orthopedics;  Laterality: Left;   COLONOSCOPY  11/2002   LAPAROSCOPIC CHOLECYSTECTOMY  03-18-2000   TRANSTHORACIC ECHOCARDIOGRAM  02-23-2011   normal lvf/  ef 58%/  mild rve/   mild lae/  mild  mr  & tr/  mild pulmonary htn   WISDOM TOOTH EXTRACTION     Social History   Tobacco Use   Smoking status: Never   Smokeless tobacco: Never  Vaping Use   Vaping Use:  Never used  Substance Use Topics   Alcohol use: Yes    Alcohol/week: 0.0 standard drinks of alcohol    Comment: wine- rarely   Drug use: No   Family History  Problem Relation Age of Onset   Stroke Mother    Heart disease Mother    Hypertension Father    Diabetes Father    Hyperlipidemia Father    Diabetes Brother    Allergies  Allergen Reactions   Lidocaine Swelling    REACTION: swelling ALL CAINE DRUGS EXCEPT: marcaine & sensercaine Other reaction(s): Edema   Xylocaine [Lidocaine Hcl] Swelling    ALLERGIC TO ALL CAINES EXCEPT SENSORCAINE , AND MARCAINE   Amantadine    Codeine Nausea And Vomiting    REACTION: vomits blood   Lithium Swelling   Tegretol [Carbamazepine] Other (See Comments)    REACTION: skin crawls   Current Outpatient Medications on File Prior to Visit  Medication Sig Dispense Refill   aspirin 81 MG tablet Take 81 mg by mouth daily.     BIOTIN PO Take 1 tablet by mouth daily.     buPROPion (WELLBUTRIN XL) 150 MG 24 hr tablet Take 300 mg by mouth daily. Take three tablets by mouth every morning     cetirizine (ZYRTEC ALLERGY) 10 MG tablet Zyrtec 10 mg tablet  Take 1 tablet every day by oral route.     Cholecalciferol (VITAMIN D) 2000 UNITS CAPS Take 1 capsule by mouth daily.     clonazePAM (KLONOPIN) 1 MG tablet Klonopin 1 mg tablet  Take 1 tablet 3 times a day by oral route.     Coenzyme Q10 (COQ10 PO) Take 100 mg by mouth daily.     fluticasone (FLONASE) 50 MCG/ACT nasal spray Place 2 sprays into both nostrils daily as needed. 48 g 3   hydrocortisone (ANUSOL-HC) 2.5 % rectal cream Apply 1 application. topically 2 (two) times daily. To affected area twice daily (around anus/ hemorrhoid) 30 g 0   lamoTRIgine (LAMICTAL) 100 MG tablet Take 100 mg by mouth daily.     levothyroxine (SYNTHROID) 50 MCG tablet Take 1 tablet (50 mcg total) by mouth daily before breakfast. 90 tablet 0   lovastatin (MEVACOR) 40 MG tablet TAKE 1 TABLET EVERY DAY 90 tablet 0    Probiotic Product (PROBIOTIC ADVANCED PO) Take by mouth.     tiZANidine (ZANAFLEX) 4 MG tablet Take 1 tablet by mouth at bedtime as needed.  0   zolpidem (AMBIEN) 10 MG tablet Take 10 mg by mouth at bedtime.     cyclobenzaprine (FLEXERIL) 10 MG tablet  (Patient not taking: Reported on 09/11/2022)     No current facility-administered medications on file prior to visit.  Review of Systems  Constitutional:  Negative for activity change, appetite change, fatigue, fever and unexpected weight change.  HENT:  Negative for congestion, ear pain, rhinorrhea, sinus pressure and sore throat.   Eyes:  Negative for pain, redness and visual disturbance.  Respiratory:  Negative for cough, shortness of breath and wheezing.   Cardiovascular:  Negative for chest pain and palpitations.  Gastrointestinal:  Negative for abdominal distention, abdominal pain, anal bleeding, blood in stool, constipation, diarrhea, nausea, rectal pain and vomiting.       Heartburn   Endocrine: Negative for polydipsia and polyuria.  Genitourinary:  Negative for dysuria, frequency and urgency.  Musculoskeletal:  Negative for arthralgias, back pain and myalgias.  Skin:  Negative for pallor and rash.       Irritation from sutures on eyelids - recent lid surgery  Allergic/Immunologic: Negative for environmental allergies.  Neurological:  Negative for dizziness, syncope and headaches.  Hematological:  Negative for adenopathy. Does not bruise/bleed easily.  Psychiatric/Behavioral:  Negative for decreased concentration and dysphoric mood. The patient is not nervous/anxious.        Objective:   Physical Exam Constitutional:      General: She is not in acute distress.    Appearance: Normal appearance. She is well-developed. She is obese. She is not ill-appearing or diaphoretic.  HENT:     Head: Normocephalic and atraumatic.  Eyes:     General: No scleral icterus.       Right eye: No discharge.        Left eye: No discharge.      Extraocular Movements: Extraocular movements intact.     Conjunctiva/sclera: Conjunctivae normal.     Pupils: Pupils are equal, round, and reactive to light.  Cardiovascular:     Rate and Rhythm: Normal rate and regular rhythm.     Heart sounds: Normal heart sounds.  Pulmonary:     Effort: Pulmonary effort is normal. No respiratory distress.     Breath sounds: Normal breath sounds. No wheezing or rales.  Abdominal:     General: Bowel sounds are normal. There is no distension.     Palpations: Abdomen is soft. There is no mass.     Tenderness: There is no abdominal tenderness. There is no right CVA tenderness, left CVA tenderness, guarding or rebound.     Hernia: No hernia is present.  Musculoskeletal:     Cervical back: Normal range of motion and neck supple.  Lymphadenopathy:     Cervical: No cervical adenopathy.  Skin:    General: Skin is warm and dry.     Coloration: Skin is not jaundiced or pale.     Findings: No erythema or rash.     Comments: Several partially dissolves sutures in lateral upper lids Small are of ecchymosis on the L    Neurological:     Mental Status: She is alert.  Psychiatric:        Mood and Affect: Mood normal.           Assessment & Plan:   Problem List Items Addressed This Visit       Digestive   GERD (gastroesophageal reflux disease) - Primary    Was off generic prevacid for a year and recently heartburn got much worse (every night) regardless of what she eats  Wt gain noted - this could contribute  Disc GERD and triggers and handouts given inst to stop eating after dinner or right before she lies down   Px pepcid 20 mg  bid  If not helpful will update and then consider return to generic prevacid (disc long term risks)  inst to watch diet/work on wt loss        Relevant Medications   famotidine (PEPCID) 20 MG tablet     Other   Morbid obesity ([place])    Discussed how this problem influences overall health and the risks it imposes   Reviewed plan for weight loss with lower calorie diet (via better food choices and also portion control or program like weight watchers) and exercise building up to or more than 30 minutes 5 days per week including some aerobic activity   Wt loss may help recent GERD symptoms as well

## 2022-09-11 NOTE — Patient Instructions (Signed)
Avoid foods that give you heartburn   Don't eat after dinner  Make sure you drink enough fluids   Try the generic for pepcid 20 mg twice daily   If not improving in 1-2 weeks please call and we will put you back on the generic prevacid you took before

## 2022-09-11 NOTE — Assessment & Plan Note (Signed)
Discussed how this problem influences overall health and the risks it imposes  Reviewed plan for weight loss with lower calorie diet (via better food choices and also portion control or program like weight watchers) and exercise building up to or more than 30 minutes 5 days per week including some aerobic activity   Wt loss may help recent GERD symptoms as well

## 2022-09-28 ENCOUNTER — Ambulatory Visit: Payer: Medicare HMO

## 2022-10-07 ENCOUNTER — Other Ambulatory Visit: Payer: Self-pay | Admitting: Family Medicine

## 2022-10-10 ENCOUNTER — Ambulatory Visit (INDEPENDENT_AMBULATORY_CARE_PROVIDER_SITE_OTHER): Payer: Medicare HMO | Admitting: Psychology

## 2022-10-10 DIAGNOSIS — F3181 Bipolar II disorder: Secondary | ICD-10-CM

## 2022-10-10 NOTE — Progress Notes (Signed)
[facility] Counselor/Therapist Progress Note  Patient ID: [name] [name], MRN: [id number],    Date: 10/10/2022  Time Spent: 60 minutes  Treatment Type: Individual Therapy  Reported Symptoms: none  Mental Status Exam: Appearance:  Casual     Behavior: Appropriate  Motor: Normal  Speech/Language:  pressured  Affect: Appropriate  Mood: normal  Thought process: normal  Thought content:   WNL  Sensory/Perceptual disturbances:   WNL  Orientation: oriented to person, place, time/date, and situation  Attention: Good  Concentration: Good  Memory: WNL  Fund of knowledge:  Good  Insight:   Good  Judgment:  Good  Impulse Control: Good   Risk Assessment: Danger to Self:  No Self-injurious Behavior: No Danger to Others: No Duty to Warn:no Physical Aggression / Violence:No  Access to Firearms a concern: No  Gang Involvement:No   Subjective: The patient attended a face-to-face individual therapy session via video visit today.  The patient agreed and consented for this session to be on caregility.  The patient was in her home alone and the therapist was in the office.  The patient is pleasant and cooperative.  The patient does present with pressured speech.  The patient reports that she and her husband got back from [place] recently and they had a nice visit.  She reports that she did have some issues with her daughter and her daughter was agitated with her during a day trip.  We talked about her daughter continuing her therapy but the patient is very concerned that she still bipolar disorder even though her therapist feels that she might not be.  I encouraged the patient just to continue to monitor and let her husband know if necessary her concerns.  The patient reports that she feels like she is having a manic state and she has been spending money and she has noticed that she is not sleeping as well.  We did some problem solving and talked about her contacting her  medication provider if she continues to have difficulty.  The patient is at least aware that she is behaving differently than she normally would.  Encouraged the patient to continue to do the things that she enjoys doing and she will be seeing her doctor in December anyway.  Interventions: Cognitive Behavioral Therapy  Diagnosis:Bipolar II disorder ([place])  Plan: Client Abilities/Strengths  intelligent, insightful  Client Treatment Preferences  Outpatient individual therapy  Client Statement of Needs  "I need someone to help monitor and manage my bipolar"  Treatment Level  Outpatient individual therapy  Symptoms  Depressed or irritable mood.: (Status: improved). Diminished interest in or  enjoyment of activities.: (Status: improved). Displays a poor attention span and is easily distracted.:  (Status: improved). Exhibits an abnormally and  persistently elevated, expansive, or irritable mood with at least three symptoms of mania (i.e., inflated  self-esteem or grandiosity, decreased need for sleep, pressured speech, flight of ideas, distractibility,  excessive goal-directed activity or psychomotor agitation, excessive involvement in pleasurable, highrisk behavior).:  (Status: improved). Feelings of hopelessness, worthlessness, or inappropriate guilt.: (Status: improved). History of at least one hypomanic,  manic, or mixed mood episode.:  (Status: maintained). History of chronic or  recurrent depression for which the client has taken antidepressant medication, been hospitalized, had  outpatient treatment, or had a course of electroconvulsive therapy.:  (Status:  maintained). Lack of energy.: (Status: improved). Lacks follow-through in  projects, even though energy is very high, since behavior lacks discipline and goal-directedness.:  (Status: improved). Poor  concentration and indecisiveness.:  (Status: improved). Reports flight of ideas or thoughts racing.: (Status: improved). Shows evidence of  a decreased need for sleep.: (Status: improved).  Sleeplessness or hypersomnia.:  (Status: improved). Social withdrawal.: (Status: improved). Suicidal thoughts and/or gestures.:   (Status: improved). The elevated mood or irritability (mania) causes marked impairment in  occupational functioning, social activities, or relationships with others.: (Status: improved). Verbalizes grandiose ideas and/or persecutory beliefs.:  (Status: improved).  Problems Addressed  Bipolar Disorder - Mania, Bipolar Disorder - Depression, Bipolar Disorder - Mania, Bipolar Disorder -  Mania, Bipolar Disorder - Depression, Bipolar Disorder - Depression, Bipolar Disorder - Mania  Goals 1. Achieve controlled behavior, moderated mood, more deliberative  speech and thought process, and a stable daily activity pattern. 2. Achieve controlled behavior, moderated mood, more deliberative  speech and thought process, and a stable daily activity pattern. 3. Alleviate manic/hypomanic mood and return to previous level of  effective functioning. 4. Develop healthy cognitive patterns and beliefs about self and the world  that lead to alleviation and help prevent the relapse of  manic/hypomanic episodes. Objective Maintain a pattern of regular rhythm to daily activities. Target Date: 2023-05-08 Frequency: Monthly Progress: 80 Modality: individual Related Interventions 1. Assist the client in establishing a more routine pattern of daily activities such as sleeping,  eating, solitary and social activities, and exercise; use and review a form to schedule, assess,  and modify these activities so that they occur in a predictable rhythm every day. Objective Identify and replace thoughts and behaviors that trigger manic or depressive symptoms. Target Date: 2023-05-08 Frequency: Monthly Progress: 80 Modality: individual Related Interventions 1. Use cognitive therapy techniques to explore and educate the client about cognitive biases  that  trigger his/her elevated or depressive mood (see Cognitive Therapy for Bipolar Disorder by  Lanny Hurst al.). 2. Teach the client cognitive-behavioral coping and relapse prevention skills including delaying  impulsive actions, structured scheduling of daily activities, keeping a regular sleep routine,  avoiding unrealistic goal striving, using relaxation procedures, identifying and avoiding episode triggers such as stimulant drug use, alcohol consumption, breaking sleep routine, or exposing  self to high stress (see Cognitive Therapy for Bipolar Disorder by Lanny Hurst al.). 3. Use cognitive therapy techniques to explore and educate the client's about cognitive biases that  trigger his/her elevated or depressive mood (see Cognitive Therapy for Bipolar Disorder by  Lanny Hurst al.). Objective Discuss and resolve troubling personal and interpersonal issues. Target Date: 2023-05-08 Frequency: Monthly Progress: 60 Modality: individual Objective Participate in periodic "maintenance" sessions. Target Date: 2023-05-08 Frequency: Monthly Progress: 80 Modality: individual Related Interventions 1. Hold periodic "maintenance" sessions within the first few months after therapy to facilitate the  client's positive changes; problem-solve obstacles to improvement. 2. Hold periodic "maintenance" sessions within the first few months after therapy to facilitate the  client's positive changes; problem-solve obstacles to improvement. Objective Describe mood state, energy level, amount of control over thoughts, and sleeping pattern. Target Date: 2023-05-08 Frequency: Monthly Progress: 80 Modality: individual Related Interventions 1. Encourage the client to share his/her thoughts and feelings; express empathy and build rapport  while assessing primary cognitive, behavioral, interpersonal, or other symptoms of the mood  disorder. 2. Assess presence, severity, and impact of past and present mood episodes on social,   occupational, and interpersonal functioning; supplement with semi-structured inventory, if  desired (e.g., Montgomery-Asberg Depression Rating Scale, Inventory to Diagnose  Depression). 3. Encourage the client to share his/her thoughts and feelings; express empathy, and build rapport  while assessing primary cognitive, behavioral, interpersonal, or other symptoms of the mood  disorder. 4. Assess presence, severity, and impact of past and present mood episodes including mania (i.e.,  pressured speech, impulsive behavior, euphoric mood, flight of ideas, reduced need for sleep,  inflated self-esteem, and high energy) on social, occupational, and interpersonal functioning;  supplement with semi-structured inventory, if desired (e.g., Young Mania Rating Scale; the  Clinical Monitoring Form); re-administer as indicated to assess treatment response. 5. Develop healthy cognitive patterns and beliefs about self and the world  that lead to alleviation and help prevent the relapse of mood  episodes. 6. Normalize energy level and return to usual activities, good judgment,  stable mood, more realistic expectations, and goal-directed behavior. 7. Normalize energy level and return to usual activities, good judgment,  stable mood, more realistic expectations, and goal-directed behavior. Diagnosis 296.52 (Bipolar I disorder, most recent episode (or current) depressed, moderate)  Medications  1. Ambien (Dosage: '10mg'$  qhs)  2. Klonopin (Dosage: '1mg'$  TID)  3. Olanzapine (Dosage: 5 mg every other day and not on weekends)  4. Prozac (Dosage: 20 mg every other day and not on weekends)  5. Wellbutrin (Dosage: 450 mg qd)  Conditions For Discharge Achievement of treatment goals and objectives  The patient approved this treatment plan and is making progress towards goals.  Oniyah Rohe G Leona Pressly,  LCSW

## 2022-10-29 ENCOUNTER — Ambulatory Visit: Payer: Medicare HMO | Admitting: Dermatology

## 2022-10-29 DIAGNOSIS — L821 Other seborrheic keratosis: Secondary | ICD-10-CM

## 2022-10-29 DIAGNOSIS — Q828 Other specified congenital malformations of skin: Secondary | ICD-10-CM | POA: Diagnosis not present

## 2022-10-29 DIAGNOSIS — L719 Rosacea, unspecified: Secondary | ICD-10-CM

## 2022-10-29 DIAGNOSIS — L720 Epidermal cyst: Secondary | ICD-10-CM

## 2022-10-29 DIAGNOSIS — L738 Other specified follicular disorders: Secondary | ICD-10-CM | POA: Diagnosis not present

## 2022-10-29 MED ORDER — METRONIDAZOLE 1 % EX GEL: Freq: Every day | CUTANEOUS | 2 refills | Status: DC

## 2022-10-29 NOTE — Patient Instructions (Addendum)
Rosacea is a chronic progressive skin condition usually affecting the face of adults, causing redness and/or acne bumps. It is treatable but not curable. It sometimes affects the eyes (ocular rosacea) as well. It may respond to topical and/or systemic medication and can flare with stress, sun exposure, alcohol, exercise, topical steroids (including hydrocortisone/cortisone 10) and some foods.  Daily application of broad spectrum spf 30+ sunscreen to face is recommended to reduce flares.  Start metronidazole 0.1% gel at bedtime to cheeks and nose for rosacea.  Recommend daily broad spectrum sunscreen SPF 30+ to sun-exposed areas, reapply every 2 hours as needed. Call for new or changing lesions.  Staying in the shade or wearing long sleeves, sun glasses (UVA+UVB protection) and wide brim hats (4-inch brim around the entire circumference of the hat) are also recommended for sun protection.   Due to recent changes in healthcare laws, you may see results of your pathology and/or laboratory studies on MyChart before the doctors have had a chance to review them. We understand that in some cases there may be results that are confusing or concerning to you. Please understand that not all results are received at the same time and often the doctors may need to interpret multiple results in order to provide you with the best plan of care or course of treatment. Therefore, we ask that you please give [place] 2 business days to thoroughly review all your results before contacting the office for clarification. Should we see a critical lab result, you will be contacted sooner.   If You Need Anything After Your Visit  If you have any questions or concerns for your doctor, please call our main line at [phone] and press option 4 to reach your doctor's medical assistant. If no one answers, please leave a voicemail as directed and we will return your call as soon as possible. Messages left after 4 pm will be answered the  following business day.   You may also send [place] a message via [place]. We typically respond to MyChart messages within 1-2 business days.  For prescription refills, please ask your pharmacy to contact our office. Our fax number is [phone].  If you have an urgent issue when the clinic is closed that cannot wait until the next business day, you can page your doctor at the number below.    Please note that while we do our best to be available for urgent issues outside of office hours, we are not available 24/7.   If you have an urgent issue and are unable to reach [place], you may choose to seek medical care at your doctor's office, retail clinic, urgent care center, or emergency room.  If you have a medical emergency, please immediately call 911 or go to the emergency department.  Pager Numbers  - Dr. Nehemiah Massed: [phone]  - Dr. Laurence Ferrari: [phone]  - Dr. Nicole Kindred: [phone]  In the event of inclement weather, please call our main line at [phone] for an update on the status of any delays or closures.  Dermatology Medication Tips: Please keep the boxes that topical medications come in in order to help keep track of the instructions about where and how to use these. Pharmacies typically print the medication instructions only on the boxes and not directly on the medication tubes.   If your medication is too expensive, please contact our office at [phone] option 4 or send [place] a message through [place].   We are unable to tell what your co-pay for medications will  be in advance as this is different depending on your insurance coverage. However, we may be able to find a substitute medication at lower cost or fill out paperwork to get insurance to cover a needed medication.   If a prior authorization is required to get your medication covered by your insurance company, please allow [place] 1-2 business days to complete this process.  Drug prices often vary depending on where the  prescription is filled and some pharmacies may offer cheaper prices.  The website www.goodrx.com contains coupons for medications through different pharmacies. The prices here do not account for what the cost may be with help from insurance (it may be cheaper with your insurance), but the website can give you the price if you did not use any insurance.  - You can print the associated coupon and take it with your prescription to the pharmacy.  - You may also stop by our office during regular business hours and pick up a GoodRx coupon card.  - If you need your prescription sent electronically to a different pharmacy, notify our office through [facility] or by phone at [phone] option 4.     Si Usted Necesita Algo Despus de Su Visita  Tambin puede enviarnos un mensaje a travs de Pharmacist, community. Por lo general respondemos a los mensajes de MyChart en el transcurso de 1 a 2 das hbiles.  Para renovar recetas, por favor pida a su farmacia que se ponga en contacto con nuestra oficina. Harland Dingwall de fax es [place] [phone].  Si tiene un asunto urgente cuando la clnica est cerrada y que no puede esperar hasta el siguiente da hbil, puede llamar/localizar a su doctor(a) al nmero que aparece a continuacin.   Por favor, tenga en cuenta que aunque hacemos todo lo posible para estar disponibles para asuntos urgentes fuera del horario de [place], no estamos disponibles las 24 horas del da, los 7 das de la [place].   Si tiene un problema urgente y no puede comunicarse con nosotros, puede optar por buscar atencin mdica  en el consultorio de su doctor(a), en una clnica privada, en un centro de atencin urgente o en una sala de emergencias.  Si tiene Engineering geologist, por favor llame inmediatamente al 911 o vaya a la sala de emergencias.  Nmeros de bper  - Dr. Nehemiah Massed: [phone]  - Dra. Moye: [phone]  - Dra. Nicole Kindred: [phone]  En caso de inclemencias del [place],  por favor llame a Johnsie Kindred principal al [phone] para una actualizacin sobre el [place] de cualquier retraso o cierre.  Consejos para la medicacin en dermatologa: Por favor, guarde las cajas en las que vienen los medicamentos de uso tpico para ayudarle a seguir las instrucciones sobre dnde y cmo usarlos. Las farmacias generalmente imprimen las instrucciones del medicamento slo en las cajas y no directamente en los tubos del [place].   Si su medicamento es muy caro, por favor, pngase en contacto con Zigmund Daniel llamando al [phone] y presione la opcin 4 o envenos un mensaje a travs de Pharmacist, community.   No podemos decirle cul ser su copago por los medicamentos por adelantado ya que esto es diferente dependiendo de la cobertura de su seguro. Sin embargo, es posible que podamos encontrar un medicamento sustituto a Electrical engineer un formulario para que el seguro cubra el medicamento que se considera necesario.   Si se requiere una autorizacin previa para que su compaa de seguros [place] su medicamento, por favor permtanos de 1  a 2 das hbiles para completar este proceso.  Los precios de los medicamentos varan con frecuencia dependiendo del Environmental consultant de dnde se surte la receta y alguna farmacias pueden ofrecer precios ms baratos.  El sitio web www.goodrx.com tiene cupones para medicamentos de Airline pilot. Los precios aqu no tienen en cuenta lo que podra costar con la ayuda del seguro (puede ser ms barato con su seguro), pero el sitio web puede darle el precio si no utiliz Research scientist (physical sciences).  - Puede imprimir el cupn correspondiente y llevarlo con su receta a la farmacia.  - Tambin puede pasar por nuestra oficina durante el horario de atencin regular y Charity fundraiser una tarjeta de cupones de GoodRx.  - Si necesita que su receta se enve electrnicamente a una farmacia diferente, informe a nuestra oficina a travs de MyChart de [place] o por telfono llamando al  [phone] y presione la opcin 4.

## 2022-10-29 NOTE — Progress Notes (Signed)
   Follow-Up Visit   Subjective  [name] [name] is a 66 y.o. female who presents for the following: spots (Patient here today to have spots checked at nose, right leg, right foot. None are sore or itching. She does have hx of SCC and BCC at right leg.).  Spot at nose present for about 3 months.  No bleeding, no scaling, no symptoms. She has used metrocream in the the past for rosacea, but it dried her skin out and she stopped using it.  The following portions of the chart were reviewed this encounter and updated as appropriate:       Review of Systems:  No other skin or systemic complaints except as noted in HPI or Assessment and Plan.  Objective  Well appearing patient in no apparent distress; mood and affect are within normal limits.  A focused examination was performed including face, right leg. Relevant physical exam findings are noted in the Assessment and Plan.  Right Lower Pretibia 5 mm Light pink waxy macule with keratotic rim   right foot dorsum Waxy tan macule  face, nose Erythema with telangiectasias on malar cheeks and nose Indistinct thin pink papule at nasal tip     forehead Small yellow papules with a central dell.   R NL fold face Smooth white papule(s).     Assessment & Plan  Porokeratosis Right Lower Pretibia  Benign-appearing.  Observation.  Call clinic for new or changing lesions.  Recommend daily use of broad spectrum spf 30+ sunscreen to sun-exposed areas.    Seborrheic keratosis right foot dorsum  Reassured benign age-related growth.  Recommend observation.  Discussed cryotherapy if spot(s) become irritated or inflamed.  Rosacea face, nose  Chronic and persistent condition with duration or expected duration over one year. Condition is symptomatic/ bothersome to patient. Not currently at goal.   Rosacea is a chronic progressive skin condition usually affecting the face of adults, causing redness and/or acne bumps. It is treatable but  not curable. It sometimes affects the eyes (ocular rosacea) as well. It may respond to topical and/or systemic medication and can flare with stress, sun exposure, alcohol, exercise, topical steroids (including hydrocortisone/cortisone 10) and some foods.  Daily application of broad spectrum spf 30+ sunscreen to face is recommended to reduce flares.  Discussed the treatment option of BBL/laser.  Typically we recommend 1-3 treatment sessions about 5-8 weeks apart for best results.  The patient's condition may require "maintenance treatments" in the future.  The fee for BBL / laser treatments is $350 per treatment session for the whole face.  A fee can be quoted for other parts of the body. Insurance typically does not pay for BBL/laser treatments and therefore the fee is an out-of-pocket cost.  Start metronidazole 0.1% gel once daily to cheeks and nose for rosacea.  metroNIDAZOLE (METROGEL) 1 % gel - face, nose Apply topically daily. To nose, cheeks for rosacea  Sebaceous hyperplasia forehead  Benign, observe.    Milia R NL fold face  Benign, observe.    Samples of Effaclar given to patient to use once daily.    Return for TBSE, as scheduled, Hx BCC, Hx SCC, Hx Dysplastic Nevi.  [name] [name], RMA, am acting as scribe for [name] Patty, MD .  Documentation: I have reviewed the above documentation for accuracy and completeness, and I agree with the above.  [name] Patty MD

## 2022-11-12 ENCOUNTER — Encounter: Payer: Self-pay | Admitting: Obstetrics & Gynecology

## 2022-11-12 ENCOUNTER — Ambulatory Visit (INDEPENDENT_AMBULATORY_CARE_PROVIDER_SITE_OTHER): Payer: Medicare HMO | Admitting: Obstetrics & Gynecology

## 2022-11-12 ENCOUNTER — Telehealth: Payer: Self-pay

## 2022-11-12 VITALS — BP 136/79 | HR 65 | Ht 60.05 in | Wt 219.0 lb

## 2022-11-12 DIAGNOSIS — N9089 Other specified noninflammatory disorders of vulva and perineum: Secondary | ICD-10-CM

## 2022-11-12 DIAGNOSIS — L280 Lichen simplex chronicus: Secondary | ICD-10-CM

## 2022-11-12 NOTE — Progress Notes (Signed)
   Established Patient Office Visit  Subjective   Patient ID: [name] [name], female    DOB: 1956-11-18  Age: 66 y.o. MRN: [id number]  Chief Complaint  Patient presents with   Gynecologic Exam    Black spot on left side of vagina,    HPI    66 yo married P1 here because she would like her vulva inspected. Dr. Nechama Guard noted a brown vulvar lesion on her right labia minora 12/2021 and rechecked it 03/2022. She noted it to be "stable" and rec'd yearly followup.  [name] [name] reported some vulvar itching last week after she took a shower but reports that this resolved after she applied clobetasol. She has a diagnosis of lichen simplex chronicus.  Objective:     BP 136/79   Pulse 65   Ht 5' 0.05" (1.525 m)   Wt 219 lb (99.3 kg)   BMI 42.70 kg/m    Physical Exam Well nourished, well hydrated White female, no apparent distress She is ambulating and conversing normally.  EG- 1 cm brown lesion at the top of her labia minora  Assessment & Plan:  Melanotic lesion of right labia minora - schedule biopsy. She is allergic to lidocaine and this is the only agent that we have in the office. It will be ordered today and I will do the biopsy when marcaine is available.  Problem List Items Addressed This Visit   None   No follow-ups on file.    Emily Filbert, MD

## 2022-11-12 NOTE — Telephone Encounter (Signed)
Patient called Metrogel is making her Rosacea worse.

## 2022-11-13 ENCOUNTER — Ambulatory Visit
Admission: RE | Admit: 2022-11-13 | Discharge: 2022-11-13 | Disposition: A | Discharge status: Home / Self Care | Payer: Medicare HMO | Source: Ambulatory Visit | Attending: Family Medicine | Admitting: Family Medicine

## 2022-11-13 ENCOUNTER — Telehealth: Payer: Self-pay

## 2022-11-13 DIAGNOSIS — Z1231 Encounter for screening mammogram for malignant neoplasm of breast: Secondary | ICD-10-CM

## 2022-11-13 MED ORDER — IVERMECTIN 1 % EX CREA: TOPICAL_CREAM | CUTANEOUS | 2 refills | Status: DC

## 2022-11-13 NOTE — Telephone Encounter (Signed)
Called pt discussed she can stop Metrogel.  Erx'd  Soolantra cream qhs to [facility]   Patient tried and failed metrogel.

## 2022-11-14 ENCOUNTER — Ambulatory Visit: Payer: Medicare HMO | Admitting: Psychology

## 2022-11-14 ENCOUNTER — Telehealth: Payer: Self-pay | Admitting: Obstetrics & Gynecology

## 2022-11-14 NOTE — Telephone Encounter (Signed)
Reached out to [name] [name] to schedule appt with Dr. Hulan Fray for the week of Dec. 11-15.  Left message for [name] [name] to call back to schedule.

## 2022-11-16 NOTE — Telephone Encounter (Signed)
Pt is scheduled with Dr. Hulan Fray on 12/13.

## 2022-11-20 ENCOUNTER — Ambulatory Visit: Payer: Medicare HMO | Admitting: Obstetrics & Gynecology

## 2022-11-20 ENCOUNTER — Other Ambulatory Visit: Payer: Self-pay | Admitting: *Deleted

## 2022-11-20 ENCOUNTER — Other Ambulatory Visit (HOSPITAL_COMMUNITY)
Admission: RE | Admit: 2022-11-20 | Discharge: 2022-11-20 | Disposition: A | Discharge status: Home / Self Care | Payer: Medicare HMO | Source: Ambulatory Visit | Attending: Obstetrics & Gynecology | Admitting: Obstetrics & Gynecology

## 2022-11-20 VITALS — Ht 61.0 in | Wt 218.0 lb

## 2022-11-20 DIAGNOSIS — N9089 Other specified noninflammatory disorders of vulva and perineum: Secondary | ICD-10-CM | POA: Insufficient documentation

## 2022-11-20 DIAGNOSIS — D038 Melanoma in situ of other sites: Secondary | ICD-10-CM

## 2022-11-20 DIAGNOSIS — D039 Melanoma in situ, unspecified: Secondary | ICD-10-CM

## 2022-11-20 HISTORY — DX: Melanoma in situ, unspecified: D03.9

## 2022-11-20 MED ORDER — FAMOTIDINE 20 MG PO TABS: 20.0000 mg | ORAL_TABLET | Freq: Two times a day (BID) | ORAL | 1 refills | Status: DC

## 2022-11-20 NOTE — Progress Notes (Signed)
   Established Patient Office Visit  Subjective   Patient ID: [name] [name], female    DOB: [birth year]  Age: 66 y.o. MRN: [id number]  Chief Complaint  Patient presents with   Follow-up    Vulvar bx today.     HPI   66 yo married P1 here for right labia minora biopsy. This lesion was noted on exam 12/2021 and rechecked 03/2022. I saw her last week and rec'd a biopsy. She is allergic to lidocaine, so marcaine was ordered and is now available.   Objective:     Ht '5\' 1"'$  (1.549 m)   Wt 218 lb (98.9 kg)   BMI 41.19 kg/m    Physical Exam   Well nourished, well hydrated White female, no apparent distress She is ambulating and conversing normally. I prepped the tip of the right labia minora area with betadine after consenting her for a vulvar biopsy. I then injected 1 mL of marcaine with a 25 gauge needle. I snipped off a 5 mm area with scissors to obtain specimen. Silver nitrate was used to achieve hemostasis. She tolerated the procedure well.  Assessment & Plan:   Problem List Items Addressed This Visit   None Visit Diagnoses     Vulvar lesion    -  Primary      I will send her a Mychart message with the results/treatment plan on Monday.   Emily Filbert, MD

## 2022-11-21 ENCOUNTER — Ambulatory Visit: Payer: Medicare HMO | Admitting: Obstetrics & Gynecology

## 2022-11-23 LAB — SURGICAL PATHOLOGY

## 2022-11-26 ENCOUNTER — Other Ambulatory Visit: Payer: Self-pay | Admitting: Obstetrics & Gynecology

## 2022-11-26 ENCOUNTER — Telehealth: Payer: Self-pay | Admitting: *Deleted

## 2022-11-26 DIAGNOSIS — D038 Melanoma in situ of other sites: Secondary | ICD-10-CM

## 2022-11-26 NOTE — Telephone Encounter (Signed)
Spoke with the patient regarding the referral to GYN oncology. Patient scheduled as new patient with Dr Ernestina Patches on 12/26 at 3:30 pm.  Patient given an arrival time of 3 pm.  Explained to the patient the the doctor will perform a pelvic exam at this visit. Patient given the policy that no visitors under the 16 yrs are allowed in the [place]. Patient given the address/phone number for the clinic and that the center offers free valet service. Patient aware of the new mask mandate.

## 2022-11-26 NOTE — Telephone Encounter (Signed)
I spoke with her and gave her the diagnosis of melanoma in situ. I told her that I have placed a gyn onc referral.

## 2022-11-26 NOTE — Progress Notes (Signed)
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

## 2022-11-27 ENCOUNTER — Encounter: Payer: Self-pay | Admitting: Obstetrics & Gynecology

## 2022-12-04 ENCOUNTER — Inpatient Hospital Stay: Payer: Medicare HMO | Attending: Psychiatry | Admitting: Psychiatry

## 2022-12-04 ENCOUNTER — Encounter: Payer: Self-pay | Admitting: Psychiatry

## 2022-12-04 VITALS — BP 146/82 | HR 65 | Temp 98.7°F | Resp 16 | Ht 61.02 in | Wt 216.8 lb

## 2022-12-04 DIAGNOSIS — B372 Candidiasis of skin and nail: Secondary | ICD-10-CM

## 2022-12-04 DIAGNOSIS — C519 Malignant neoplasm of vulva, unspecified: Secondary | ICD-10-CM

## 2022-12-04 DIAGNOSIS — K219 Gastro-esophageal reflux disease without esophagitis: Secondary | ICD-10-CM | POA: Diagnosis not present

## 2022-12-04 DIAGNOSIS — L570 Actinic keratosis: Secondary | ICD-10-CM | POA: Insufficient documentation

## 2022-12-04 DIAGNOSIS — Z85828 Personal history of other malignant neoplasm of skin: Secondary | ICD-10-CM | POA: Insufficient documentation

## 2022-12-04 DIAGNOSIS — Z79899 Other long term (current) drug therapy: Secondary | ICD-10-CM | POA: Insufficient documentation

## 2022-12-04 DIAGNOSIS — E039 Hypothyroidism, unspecified: Secondary | ICD-10-CM | POA: Diagnosis not present

## 2022-12-04 DIAGNOSIS — E785 Hyperlipidemia, unspecified: Secondary | ICD-10-CM | POA: Insufficient documentation

## 2022-12-04 DIAGNOSIS — Z7982 Long term (current) use of aspirin: Secondary | ICD-10-CM | POA: Diagnosis not present

## 2022-12-04 DIAGNOSIS — F319 Bipolar disorder, unspecified: Secondary | ICD-10-CM | POA: Diagnosis not present

## 2022-12-04 DIAGNOSIS — Z79890 Hormone replacement therapy (postmenopausal): Secondary | ICD-10-CM | POA: Insufficient documentation

## 2022-12-04 DIAGNOSIS — J309 Allergic rhinitis, unspecified: Secondary | ICD-10-CM | POA: Insufficient documentation

## 2022-12-04 MED ORDER — NYSTATIN 100000 UNIT/GM EX POWD: 1.0000 | Freq: Three times a day (TID) | CUTANEOUS | 0 refills | Status: DC

## 2022-12-04 NOTE — Patient Instructions (Addendum)
It was a pleasure to see you in clinic today. - We will get you scheduled for surgery to excise the area around the biopsy on the labia.  - Return visit planned for a preoperative visit. - You can try nystatin powder for the skin on you stomach.  Thank you very much for allowing me to provide care for you today.  I appreciate your confidence in choosing our Gynecologic Oncology team at [facility].  If you have any questions about your visit today please call our office or send [place] a MyChart message and we will get back to you as soon as possible.

## 2022-12-04 NOTE — Progress Notes (Addendum)
GYNECOLOGIC ONCOLOGY NEW PATIENT CONSULTATION  Date of Service: 12/04/2022 Referring Provider: Clovia Cuff, MD   ASSESSMENT AND PLAN: [name] [name] is a 66 y.o. woman with vulvar melanoma in situ.  Reviewed the nature of vulvar melanoma. Reviewed that based on the biopsy, no invasive component noted at this time (stage 0). However, it is possible that with more complete excision, and invasive component could be identified.   Recommend treatment with simple partial vulvectomy to excise the biopsy site with goal of clear margin surrounding this area.  The goal is also to preserve vulvar anatomy as best as possible.  The risks of surgery were discussed in detail and she understands these to including but not limited to bleeding requiring a blood transfusion, infection, injury to adjacent organs (including but not limited to the bowels, bladder, ureters, nerves, blood vessels), wound separation, unforseen complication, and possible need for re-exploration.   Reviewed that if invasive melanoma found following excision, she could possibly require an additional procedure (possible re-excision, possible lymph node sampling), but this would depend on final pathology.   She has elected to proceed with surgical excision.  Will have patient return for a preoperative visit. Will likely be able to schedule surgery some time in January.   Pt's METS are >4. She reports that she exercises out several times a week. Clearance not required.   Pt also with possible yeast skin infection under left pannus. Rx for nystatin powder sent to pharmacy. Discussed with pt if no improvement, would recommend f/u with her PCP.Marland Kitchen   A copy of this note was sent to the patient's referring provider.  Bernadene Bell, MD Gynecologic Oncology   Medical Decision Making I personally spent  TOTAL 45 minutes face-to-face and non-face-to-face in the care of this patient, which includes all pre, intra, and post visit time on the  date of service.   ------------  CC: Vulvar melanoma in situ  HISTORY OF PRESENT ILLNESS:  [name] [name] is a 66 y.o. woman who is seen in consultation at the request of Clovia Cuff, MD for evaluation of vulvar melanoma in situ.  Pt presented to her Ob/Gyn for evaluation of vulvar itching. She had noted itching the week prior, but this resolved with application of clobetasol which she had for a diagnosis of lichen simplex chronicus. However, patient had also been previously noted by another provider to have a brown vulvar lesions of her right labia minora 12/2021 which was rechecked 03/2022 and noted to be "stable" with plan for continued yearly follow-up.   At time of most recent visit with her Ob/Gyn on 12/4, a 1cm brown lesion was noted "at the top of her labia minora." Given the appearance of a melanotic lesion, she was recommended to return for a biopsy, which was completed on 11/20/22. Biopsy returned with melanoma in situ, edges involved. No invasive disease was seen.   Today, patient presents with her husband. She reports that she is overall doing well; however she reports a new skin rash that started last week on her left inferior abdominal wall under her skin fold. She wasn't sure if this was due to a medication side effect or infection. She has treated the area with neosporin and mupirocin with some improvement but still present.    PAST MEDICAL HISTORY: Past Medical History:  Diagnosis Date   Actinic keratosis 03/29/2021   R central upper lip   Allergic rhinitis    Atypical mole 12/26/2021   spinal upper back, exc 01/15/22  Basal cell carcinoma 10/16/2010   R lower pretibia    Bipolar 1 disorder (HCC)    Fissure, anal    GERD (gastroesophageal reflux disease)    History of basal cell carcinoma excision    ON FACE   History of squamous cell carcinoma excision    ON RIGHT LEG   Hyperlipidemia    Hypothyroidism    IBS (irritable bowel syndrome)    Liver cyst    Mallet  finger of left hand    small finger    PAST SURGICAL HISTORY: Past Surgical History:  Procedure Laterality Date   CARDIAC CATHETERIZATION  04/1999   per pt normal   CARDIOVASCULAR STRESS TEST  02-23-2011  dr Chrissie Noa [facility])   normal nuclear study/  no ischemia/  ef 64%   CATARACT EXTRACTION W/ INTRAOCULAR LENS  IMPLANT, BILATERAL  2003   Bil   CESAREAN SECTION  1984   CLOSED REDUCTION FINGER WITH PERCUTANEOUS PINNING Left 04/22/2014   Procedure: LEFT SMALL FINGER CLOSED REDUCTION PINNING;  Surgeon: Linna Hoff, MD;  Location: [place];  Service: Orthopedics;  Laterality: Left;   COLONOSCOPY  11/2002   LAPAROSCOPIC CHOLECYSTECTOMY  03-18-2000   TRANSTHORACIC ECHOCARDIOGRAM  02-23-2011   normal lvf/  ef 58%/  mild rve/   mild lae/  mild  mr  & tr/  mild pulmonary htn   WISDOM TOOTH EXTRACTION      OB/GYN HISTORY: OB History  Gravida Para Term Preterm AB Living  '1 1 1     1  '$ SAB IAB Ectopic Multiple Live Births          1    # Outcome Date GA Lbr Len/2nd Weight Sex Delivery Anes PTL Lv  1 Term 09/20/83 [redacted]w[redacted]d  F CS-LTranv   LIV     Complications: Fetal Intolerance      Age at menarche: 89Age at menopause: 89Hx of HRT: no Hx of STI: no Last pap: 05/2022 History of abnormal pap smears: yes, most recent pap, ASCUS, HPV negative (05/2022); none abnormal prior  SCREENING STUDIES:  Last mammogram: 10/2022 Last colonoscopy: 2015  MEDICATIONS:  Current Outpatient Medications:    BIOTIN PO, Take 1 tablet by mouth daily., Disp: , Rfl:    buPROPion (WELLBUTRIN XL) 150 MG 24 hr tablet, Take 300 mg by mouth daily. Take three tablets by mouth every morning, Disp: , Rfl:    cetirizine (ZYRTEC ALLERGY) 10 MG tablet, Zyrtec 10 mg tablet  Take 1 tablet every day by oral route., Disp: , Rfl:    Cholecalciferol (VITAMIN D) 2000 UNITS CAPS, Take 1 capsule by mouth daily., Disp: , Rfl:    clonazePAM (KLONOPIN) 1 MG tablet, Klonopin 1 mg tablet  Take 1  tablet 3 times a day by oral route., Disp: , Rfl:    famotidine (PEPCID) 20 MG tablet, Take 1 tablet (20 mg total) by mouth 2 (two) times daily., Disp: 180 tablet, Rfl: 1   fluticasone (FLONASE) 50 MCG/ACT nasal spray, Place 2 sprays into both nostrils daily as needed., Disp: 48 g, Rfl: 3   hydrocortisone (ANUSOL-HC) 2.5 % rectal cream, Apply 1 application. topically 2 (two) times daily. To affected area twice daily (around anus/ hemorrhoid), Disp: 30 g, Rfl: 0   Ivermectin (SOOLANTRA) 1 % CREA, Apply to face at bedtime, Disp: 45 g, Rfl: 2   lamoTRIgine (LAMICTAL) 100 MG tablet, Take 100 mg by mouth daily., Disp: , Rfl:    levothyroxine (SYNTHROID) 50  MCG tablet, TAKE 1 TABLET EVERY DAY BEFORE BREAKFAST, Disp: 90 tablet, Rfl: 1   lovastatin (MEVACOR) 40 MG tablet, TAKE 1 TABLET EVERY DAY, Disp: 90 tablet, Rfl: 1   metroNIDAZOLE (METROGEL) 1 % gel, Apply topically daily. To nose, cheeks for rosacea, Disp: 45 g, Rfl: 2   Probiotic Product (PROBIOTIC ADVANCED PO), Take by mouth., Disp: , Rfl:    tiZANidine (ZANAFLEX) 4 MG tablet, Take 1 tablet by mouth at bedtime as needed., Disp: , Rfl: 0   zolpidem (AMBIEN) 10 MG tablet, Take 10 mg by mouth at bedtime., Disp: , Rfl:    aspirin 81 MG tablet, Take 81 mg by mouth daily. (Patient not taking: Reported on 12/04/2022), Disp: , Rfl:    Coenzyme Q10 (COQ10 PO), Take 100 mg by mouth daily. (Patient not taking: Reported on 12/04/2022), Disp: , Rfl:    cyclobenzaprine (FLEXERIL) 10 MG tablet, , Disp: , Rfl:    valbenazine (INGREZZA) 40 MG capsule, Take 40 mg by mouth daily. (Patient not taking: Reported on 12/04/2022), Disp: , Rfl:   ALLERGIES: Allergies  Allergen Reactions   Lidocaine Swelling    REACTION: swelling ALL CAINE DRUGS EXCEPT: marcaine & sensercaine Other reaction(s): Edema   Xylocaine [Lidocaine Hcl] Swelling    ALLERGIC TO ALL CAINES EXCEPT SENSORCAINE , AND MARCAINE   Amantadine    Codeine Nausea And Vomiting    REACTION: vomits blood    Lithium Swelling   Tegretol [Carbamazepine] Other (See Comments)    REACTION: skin crawls    FAMILY HISTORY: Family History  Problem Relation Age of Onset   Stroke Mother    Heart disease Mother    Hypertension Father    Diabetes Father    Hyperlipidemia Father    Diabetes Brother    Colon cancer Neg Hx    Breast cancer Neg Hx    Ovarian cancer Neg Hx    Endometrial cancer Neg Hx    Pancreatic cancer Neg Hx    Prostate cancer Neg Hx     SOCIAL HISTORY: Social History   Socioeconomic History   Marital status: Married    Spouse name: Not on file   Number of children: 1   Years of education: Not on file   Highest education level: Not on file  Occupational History   Occupation: Home    Employer: RETIRED  Tobacco Use   Smoking status: Never   Smokeless tobacco: Never  Vaping Use   Vaping Use: Never used  Substance and Sexual Activity   Alcohol use: Yes    Alcohol/week: 0.0 standard drinks of alcohol    Comment: wine- rarely   Drug use: No   Sexual activity: Not Currently    Birth control/protection: Post-menopausal  Other Topics Concern   Not on file  Social History Narrative   Moved here from [place]   Social Determinants of Health   Financial Resource Strain: Low Risk  (05/25/2022)   Overall Financial Resource Strain (CARDIA)    Difficulty of Paying Living Expenses: Not hard at all  Food Insecurity: No Food Insecurity (05/25/2022)   Hunger Vital Sign    Worried About Running Out of Food in the Last Year: Never true    Ran Out of Food in the Last Year: Never true  Transportation Needs: No Transportation Needs (05/25/2022)   PRAPARE - Hydrologist (Medical): No    Lack of Transportation (Non-Medical): No  Physical Activity: Sufficiently Active (05/25/2022)   Exercise Vital Sign  Days of Exercise per Week: 4 days    Minutes of Exercise per Session: 60 min  Stress: No Stress Concern Present (05/25/2022)   [place]    Feeling of Stress : Not at all  Social Connections: Moderately Integrated (05/25/2022)   Social Connection and Isolation Panel [NHANES]    Frequency of Communication with Friends and Family: More than three times a week    Frequency of Social Gatherings with Friends and Family: Once a week    Attends Religious Services: More than 4 times per year    Active Member of [employer] or Organizations: No    Attends Archivist Meetings: Never    Marital Status: Married  Human resources officer Violence: Not At Risk (05/25/2022)   Humiliation, Afraid, Rape, and Kick questionnaire    Fear of Current or Ex-Partner: No    Emotionally Abused: No    Physically Abused: No    Sexually Abused: No    REVIEW OF SYSTEMS: New patient intake form was reviewed.  Complete 10-system review is negative except for the following: rash, back pain, wound, anxiety, itching  PHYSICAL EXAM: BP (!) 179/82 (BP Location: Right Arm, Patient Position: Sitting)   Pulse 65   Temp 98.7 F (37.1 C) (Oral)   Resp 16   Ht 5' 1.02" (1.55 m)   Wt 216 lb 12.8 oz (98.3 kg)   SpO2 98%   BMI 40.93 kg/m  Constitutional: No acute distress. Neuro/Psych: Alert, oriented.  Head and Neck: Normocephalic, atraumatic. Neck symmetric without masses. Sclera anicteric.  Respiratory: Normal work of breathing. Clear to auscultation bilaterally. Cardiovascular: Regular rate and rhythm, no murmurs, rubs, or gallops. Abdomen: Normoactive bowel sounds. Soft, non-distended, non-tender to palpation. No masses or hepatosplenomegaly appreciated. No evidence of hernia. No palpable fluid wave.  Extremities: Grossly normal range of motion. Warm, well perfused. No edema bilaterally. Skin:Erythematous lesion on inferior left abdominal wall, underlying pannus fold with satellite red lesions, concerning for a yeast infection  Lymphatic: No cervical, supraclavicular, or inguinal  adenopathy. Genitourinary: External genitalia with anterior apical portion of right labia minora, near midline, with few 1-15m areas of melanotic appearance. Prior biopsy site on medial surface at this location, healing well. Additional 3-560marea of slight darkening of skin just adjacent to the left apical margin of the clitoral hood, but not on the hood. On speculum exam, vagina and cervix without lesions.  Exam chaperoned by JeBonna GainsRN  Physical Exam Genitourinary:       LABORATORY AND RADIOLOGIC DATA: Outside medical records were reviewed to synthesize the above history, along with the history and physical obtained during the visit.  Outside laboratory, pathology reports were reviewed, with pertinent results below.    WBC  Date Value Ref Range Status  05/18/2022 5.0 4.0 - 10.5 K/uL Final   Hemoglobin  Date Value Ref Range Status  05/18/2022 13.0 12.0 - 15.0 g/dL Final  10/19/2015 13.3 11.1 - 15.9 g/dL Final   HCT  Date Value Ref Range Status  05/18/2022 39.8 36.0 - 46.0 % Final   Hematocrit  Date Value Ref Range Status  10/19/2015 40.3 34.0 - 46.6 % Final   Platelets  Date Value Ref Range Status  05/18/2022 218.0 150.0 - 400.0 K/uL Final  10/19/2015 202 150 - 379 x10E3/uL Final   Magnesium  Date Value Ref Range Status  06/20/2014 1.8 mg/dL Final    Comment:    1.8-2.4 THERAPEUTIC RANGE: 4-7 mg/dL TOXIC: >  10 mg/dL  -----------------------    Creatinine  Date Value Ref Range Status  06/20/2014 0.99 0.60 - 1.30 mg/dL Final   Creatinine, Ser  Date Value Ref Range Status  05/18/2022 1.19 0.40 - 1.20 mg/dL Final   AST  Date Value Ref Range Status  05/18/2022 17 0 - 37 U/L Final   ALT  Date Value Ref Range Status  05/18/2022 11 0 - 35 U/L Final   HPV  Date Value Ref Range Status  05/14/2019 NOT DETECTED  Final    Comment:    Normal Reference Range - NOT Detected  01/03/2017 NOT DETECTED  Final    Comment:    Normal Reference Range - NOT  Detected    Surgical pathology (11/20/22) FINAL MICROSCOPIC DIAGNOSIS:   A.   LABIA MINORA, RIGHT, BIOPSY: MELANOMA IN SITU. EDGES INVOLVED.

## 2022-12-04 NOTE — H&P (View-Only) (Signed)
GYNECOLOGIC ONCOLOGY NEW PATIENT CONSULTATION  Date of Service: 12/04/2022 Referring Provider: Clovia Cuff, MD   ASSESSMENT AND PLAN: [name] [name] is a 66 y.o. woman with vulvar melanoma in situ.  Reviewed the nature of vulvar melanoma. Reviewed that based on the biopsy, no invasive component noted at this time (stage 0). However, it is possible that with more complete excision, and invasive component could be identified.   Recommend treatment with simple partial vulvectomy to excise the biopsy site with goal of clear margin surrounding this area.  The goal is also to preserve vulvar anatomy as best as possible.  The risks of surgery were discussed in detail and she understands these to including but not limited to bleeding requiring a blood transfusion, infection, injury to adjacent organs (including but not limited to the bowels, bladder, ureters, nerves, blood vessels), wound separation, unforseen complication, and possible need for re-exploration.   Reviewed that if invasive melanoma found following excision, she could possibly require an additional procedure (possible re-excision, possible lymph node sampling), but this would depend on final pathology.   She has elected to proceed with surgical excision.  Will have patient return for a preoperative visit. Will likely be able to schedule surgery some time in January.   Pt's METS are >4. She reports that she exercises out several times a week. Clearance not required.   Pt also with possible yeast skin infection under left pannus. Rx for nystatin powder sent to pharmacy. Discussed with pt if no improvement, would recommend f/u with her PCP.Marland Kitchen   A copy of this note was sent to the patient's referring provider.  Bernadene Bell, MD Gynecologic Oncology   Medical Decision Making I personally spent  TOTAL 45 minutes face-to-face and non-face-to-face in the care of this patient, which includes all pre, intra, and post visit time on the  date of service.   ------------  CC: Vulvar melanoma in situ  HISTORY OF PRESENT ILLNESS:  [name] [name] is a 66 y.o. woman who is seen in consultation at the request of Clovia Cuff, MD for evaluation of vulvar melanoma in situ.  Pt presented to her Ob/Gyn for evaluation of vulvar itching. She had noted itching the week prior, but this resolved with application of clobetasol which she had for a diagnosis of lichen simplex chronicus. However, patient had also been previously noted by another provider to have a brown vulvar lesions of her right labia minora 12/2021 which was rechecked 03/2022 and noted to be "stable" with plan for continued yearly follow-up.   At time of most recent visit with her Ob/Gyn on 12/4, a 1cm brown lesion was noted "at the top of her labia minora." Given the appearance of a melanotic lesion, she was recommended to return for a biopsy, which was completed on 11/20/22. Biopsy returned with melanoma in situ, edges involved. No invasive disease was seen.   Today, patient presents with her husband. She reports that she is overall doing well; however she reports a new skin rash that started last week on her left inferior abdominal wall under her skin fold. She wasn't sure if this was due to a medication side effect or infection. She has treated the area with neosporin and mupirocin with some improvement but still present.    PAST MEDICAL HISTORY: Past Medical History:  Diagnosis Date   Actinic keratosis 03/29/2021   R central upper lip   Allergic rhinitis    Atypical mole 12/26/2021   spinal upper back, exc 01/15/22  Basal cell carcinoma 10/16/2010   R lower pretibia    Bipolar 1 disorder (HCC)    Fissure, anal    GERD (gastroesophageal reflux disease)    History of basal cell carcinoma excision    ON FACE   History of squamous cell carcinoma excision    ON RIGHT LEG   Hyperlipidemia    Hypothyroidism    IBS (irritable bowel syndrome)    Liver cyst    Mallet  finger of left hand    small finger    PAST SURGICAL HISTORY: Past Surgical History:  Procedure Laterality Date   CARDIAC CATHETERIZATION  04/1999   per pt normal   CARDIOVASCULAR STRESS TEST  02-23-2011  dr Chrissie Noa fath [facility])   normal nuclear study/  no ischemia/  ef 64%   CATARACT EXTRACTION W/ INTRAOCULAR LENS  IMPLANT, BILATERAL  2003   Bil   CESAREAN SECTION  1984   CLOSED REDUCTION FINGER WITH PERCUTANEOUS PINNING Left 04/22/2014   Procedure: LEFT SMALL FINGER CLOSED REDUCTION PINNING;  Surgeon: Linna Hoff, MD;  Location: [facility];  Service: Orthopedics;  Laterality: Left;   COLONOSCOPY  11/2002   LAPAROSCOPIC CHOLECYSTECTOMY  03-18-2000   TRANSTHORACIC ECHOCARDIOGRAM  02-23-2011   normal lvf/  ef 58%/  mild rve/   mild lae/  mild  mr  & tr/  mild pulmonary htn   WISDOM TOOTH EXTRACTION      OB/GYN HISTORY: OB History  Gravida Para Term Preterm AB Living  '1 1 1     1  '$ SAB IAB Ectopic Multiple Live Births          1    # Outcome Date GA Lbr Len/2nd Weight Sex Delivery Anes PTL Lv  1 Term 09/20/83 [redacted]w[redacted]d  F CS-LTranv   LIV     Complications: Fetal Intolerance      Age at menarche: 89Age at menopause: 89Hx of HRT: no Hx of STI: no Last pap: 05/2022 History of abnormal pap smears: yes, most recent pap, ASCUS, HPV negative (05/2022); none abnormal prior  SCREENING STUDIES:  Last mammogram: 10/2022 Last colonoscopy: 2015  MEDICATIONS:  Current Outpatient Medications:    BIOTIN PO, Take 1 tablet by mouth daily., Disp: , Rfl:    buPROPion (WELLBUTRIN XL) 150 MG 24 hr tablet, Take 300 mg by mouth daily. Take three tablets by mouth every morning, Disp: , Rfl:    cetirizine (ZYRTEC ALLERGY) 10 MG tablet, Zyrtec 10 mg tablet  Take 1 tablet every day by oral route., Disp: , Rfl:    Cholecalciferol (VITAMIN D) 2000 UNITS CAPS, Take 1 capsule by mouth daily., Disp: , Rfl:    clonazePAM (KLONOPIN) 1 MG tablet, Klonopin 1 mg tablet  Take 1  tablet 3 times a day by oral route., Disp: , Rfl:    famotidine (PEPCID) 20 MG tablet, Take 1 tablet (20 mg total) by mouth 2 (two) times daily., Disp: 180 tablet, Rfl: 1   fluticasone (FLONASE) 50 MCG/ACT nasal spray, Place 2 sprays into both nostrils daily as needed., Disp: 48 g, Rfl: 3   hydrocortisone (ANUSOL-HC) 2.5 % rectal cream, Apply 1 application. topically 2 (two) times daily. To affected area twice daily (around anus/ hemorrhoid), Disp: 30 g, Rfl: 0   Ivermectin (SOOLANTRA) 1 % CREA, Apply to face at bedtime, Disp: 45 g, Rfl: 2   lamoTRIgine (LAMICTAL) 100 MG tablet, Take 100 mg by mouth daily., Disp: , Rfl:    levothyroxine (SYNTHROID) 50  MCG tablet, TAKE 1 TABLET EVERY DAY BEFORE BREAKFAST, Disp: 90 tablet, Rfl: 1   lovastatin (MEVACOR) 40 MG tablet, TAKE 1 TABLET EVERY DAY, Disp: 90 tablet, Rfl: 1   metroNIDAZOLE (METROGEL) 1 % gel, Apply topically daily. To nose, cheeks for rosacea, Disp: 45 g, Rfl: 2   Probiotic Product (PROBIOTIC ADVANCED PO), Take by mouth., Disp: , Rfl:    tiZANidine (ZANAFLEX) 4 MG tablet, Take 1 tablet by mouth at bedtime as needed., Disp: , Rfl: 0   zolpidem (AMBIEN) 10 MG tablet, Take 10 mg by mouth at bedtime., Disp: , Rfl:    aspirin 81 MG tablet, Take 81 mg by mouth daily. (Patient not taking: Reported on 12/04/2022), Disp: , Rfl:    Coenzyme Q10 (COQ10 PO), Take 100 mg by mouth daily. (Patient not taking: Reported on 12/04/2022), Disp: , Rfl:    cyclobenzaprine (FLEXERIL) 10 MG tablet, , Disp: , Rfl:    valbenazine (INGREZZA) 40 MG capsule, Take 40 mg by mouth daily. (Patient not taking: Reported on 12/04/2022), Disp: , Rfl:   ALLERGIES: Allergies  Allergen Reactions   Lidocaine Swelling    REACTION: swelling ALL CAINE DRUGS EXCEPT: marcaine & sensercaine Other reaction(s): Edema   Xylocaine [Lidocaine Hcl] Swelling    ALLERGIC TO ALL CAINES EXCEPT SENSORCAINE , AND MARCAINE   Amantadine    Codeine Nausea And Vomiting    REACTION: vomits blood    Lithium Swelling   Tegretol [Carbamazepine] Other (See Comments)    REACTION: skin crawls    FAMILY HISTORY: Family History  Problem Relation Age of Onset   Stroke Mother    Heart disease Mother    Hypertension Father    Diabetes Father    Hyperlipidemia Father    Diabetes Brother    Colon cancer Neg Hx    Breast cancer Neg Hx    Ovarian cancer Neg Hx    Endometrial cancer Neg Hx    Pancreatic cancer Neg Hx    Prostate cancer Neg Hx     SOCIAL HISTORY: Social History   Socioeconomic History   Marital status: Married    Spouse name: Not on file   Number of children: 1   Years of education: Not on file   Highest education level: Not on file  Occupational History   Occupation: Home    Employer: RETIRED  Tobacco Use   Smoking status: Never   Smokeless tobacco: Never  Vaping Use   Vaping Use: Never used  Substance and Sexual Activity   Alcohol use: Yes    Alcohol/week: 0.0 standard drinks of alcohol    Comment: wine- rarely   Drug use: No   Sexual activity: Not Currently    Birth control/protection: Post-menopausal  Other Topics Concern   Not on file  Social History Narrative   Moved here from [place]   Social Determinants of Health   Financial Resource Strain: Low Risk  (05/25/2022)   Overall Financial Resource Strain (CARDIA)    Difficulty of Paying Living Expenses: Not hard at all  Food Insecurity: No Food Insecurity (05/25/2022)   Hunger Vital Sign    Worried About Running Out of Food in the Last Year: Never true    Ran Out of Food in the Last Year: Never true  Transportation Needs: No Transportation Needs (05/25/2022)   PRAPARE - Hydrologist (Medical): No    Lack of Transportation (Non-Medical): No  Physical Activity: Sufficiently Active (05/25/2022)   Exercise Vital Sign  Days of Exercise per Week: 4 days    Minutes of Exercise per Session: 60 min  Stress: No Stress Concern Present (05/25/2022)   [place]    Feeling of Stress : Not at all  Social Connections: Moderately Integrated (05/25/2022)   Social Connection and Isolation Panel [NHANES]    Frequency of Communication with Friends and Family: More than three times a week    Frequency of Social Gatherings with Friends and Family: Once a week    Attends Religious Services: More than 4 times per year    Active Member of [employer] or Organizations: No    Attends Archivist Meetings: Never    Marital Status: Married  Human resources officer Violence: Not At Risk (05/25/2022)   Humiliation, Afraid, Rape, and Kick questionnaire    Fear of Current or Ex-Partner: No    Emotionally Abused: No    Physically Abused: No    Sexually Abused: No    REVIEW OF SYSTEMS: New patient intake form was reviewed.  Complete 10-system review is negative except for the following: rash, back pain, wound, anxiety, itching  PHYSICAL EXAM: BP (!) 179/82 (BP Location: Right Arm, Patient Position: Sitting)   Pulse 65   Temp 98.7 F (37.1 C) (Oral)   Resp 16   Ht 5' 1.02" (1.55 m)   Wt 216 lb 12.8 oz (98.3 kg)   SpO2 98%   BMI 40.93 kg/m  Constitutional: No acute distress. Neuro/Psych: Alert, oriented.  Head and Neck: Normocephalic, atraumatic. Neck symmetric without masses. Sclera anicteric.  Respiratory: Normal work of breathing. Clear to auscultation bilaterally. Cardiovascular: Regular rate and rhythm, no murmurs, rubs, or gallops. Abdomen: Normoactive bowel sounds. Soft, non-distended, non-tender to palpation. No masses or hepatosplenomegaly appreciated. No evidence of hernia. No palpable fluid wave.  Extremities: Grossly normal range of motion. Warm, well perfused. No edema bilaterally. Skin:Erythematous lesion on inferior left abdominal wall, underlying pannus fold with satellite red lesions, concerning for a yeast infection  Lymphatic: No cervical, supraclavicular, or inguinal  adenopathy. Genitourinary: External genitalia with anterior apical portion of right labia minora, near midline, with few 1-65m areas of melanotic appearance. Prior biopsy site on medial surface at this location, healing well. Additional 3-533marea of slight darkening of skin just adjacent to the left apical margin of the clitoral hood, but not on the hood. On speculum exam, vagina and cervix without lesions.  Exam chaperoned by JeBonna GainsRN  Physical Exam Genitourinary:       LABORATORY AND RADIOLOGIC DATA: Outside medical records were reviewed to synthesize the above history, along with the history and physical obtained during the visit.  Outside laboratory, pathology reports were reviewed, with pertinent results below.    WBC  Date Value Ref Range Status  05/18/2022 5.0 4.0 - 10.5 K/uL Final   Hemoglobin  Date Value Ref Range Status  05/18/2022 13.0 12.0 - 15.0 g/dL Final  10/19/2015 13.3 11.1 - 15.9 g/dL Final   HCT  Date Value Ref Range Status  05/18/2022 39.8 36.0 - 46.0 % Final   Hematocrit  Date Value Ref Range Status  10/19/2015 40.3 34.0 - 46.6 % Final   Platelets  Date Value Ref Range Status  05/18/2022 218.0 150.0 - 400.0 K/uL Final  10/19/2015 202 150 - 379 x10E3/uL Final   Magnesium  Date Value Ref Range Status  06/20/2014 1.8 mg/dL Final    Comment:    1.8-2.4 THERAPEUTIC RANGE: 4-7 mg/dL TOXIC: >  10 mg/dL  -----------------------    Creatinine  Date Value Ref Range Status  06/20/2014 0.99 0.60 - 1.30 mg/dL Final   Creatinine, Ser  Date Value Ref Range Status  05/18/2022 1.19 0.40 - 1.20 mg/dL Final   AST  Date Value Ref Range Status  05/18/2022 17 0 - 37 U/L Final   ALT  Date Value Ref Range Status  05/18/2022 11 0 - 35 U/L Final   HPV  Date Value Ref Range Status  05/14/2019 NOT DETECTED  Final    Comment:    Normal Reference Range - NOT Detected  01/03/2017 NOT DETECTED  Final    Comment:    Normal Reference Range - NOT  Detected    Surgical pathology (11/20/22) FINAL MICROSCOPIC DIAGNOSIS:   A.   LABIA MINORA, RIGHT, BIOPSY: MELANOMA IN SITU. EDGES INVOLVED.

## 2022-12-05 ENCOUNTER — Ambulatory Visit (INDEPENDENT_AMBULATORY_CARE_PROVIDER_SITE_OTHER): Payer: Medicare HMO | Admitting: Psychology

## 2022-12-05 DIAGNOSIS — F3181 Bipolar II disorder: Secondary | ICD-10-CM

## 2022-12-05 NOTE — Progress Notes (Signed)
[facility] Counselor/Therapist Progress Note  Patient ID: [name] [name], MRN: [id number],    Date: 12/05/2022  Time Spent: 60 minutes  Treatment Type: Individual Therapy  Reported Symptoms: none  Mental Status Exam: Appearance:  Casual     Behavior: Appropriate  Motor: Normal  Speech/Language:  pressured  Affect: Appropriate  Mood: normal  Thought process: normal  Thought content:   WNL  Sensory/Perceptual disturbances:   WNL  Orientation: oriented to person, place, time/date, and situation  Attention: Good  Concentration: Good  Memory: WNL  Fund of knowledge:  Good  Insight:   Good  Judgment:  Good  Impulse Control: Good   Risk Assessment: Danger to Self:  No Self-injurious Behavior: No Danger to Others: No Duty to Warn:no Physical Aggression / Violence:No  Access to Firearms a concern: No  Gang Involvement:No   Subjective: The patient attended a face-to-face individual therapy session via video visit today.  The patient agreed and consented for this session to be on caregility.  The patient was in her home alone and the therapist was in the office.  The patient presents with a blunted affect and her mood is anxious today.  The patient reports that she was diagnosed with vulvar cancer.  She says that she did get a report and it seems that it is level 0 and they are going to do surgery to remove some sections of her vulva.  We talked about this and talked about how to reframe things in her mind and she seemed very happy about how we did this.  I encouraged her to be positive because the leveling system was a 0 and that they will be able to take care of the situation for her by doing the surgery.  The patient seems to be doing a little bit better with the circumstance after our conversation today.  Interventions: Cognitive Behavioral Therapy  Diagnosis:Bipolar II disorder ([place])  Plan: Client Abilities/Strengths  intelligent, insightful  Client  Treatment Preferences  Outpatient individual therapy  Client Statement of Needs  "I need someone to help monitor and manage my bipolar"  Treatment Level  Outpatient individual therapy  Symptoms  Depressed or irritable mood.: (Status: improved). Diminished interest in or  enjoyment of activities.: (Status: improved). Displays a poor attention span and is easily distracted.:  (Status: improved). Exhibits an abnormally and  persistently elevated, expansive, or irritable mood with at least three symptoms of mania (i.e., inflated  self-esteem or grandiosity, decreased need for sleep, pressured speech, flight of ideas, distractibility,  excessive goal-directed activity or psychomotor agitation, excessive involvement in pleasurable, highrisk behavior).:  (Status: improved). Feelings of hopelessness, worthlessness, or inappropriate guilt.: (Status: improved). History of at least one hypomanic,  manic, or mixed mood episode.:  (Status: maintained). History of chronic or  recurrent depression for which the client has taken antidepressant medication, been hospitalized, had  outpatient treatment, or had a course of electroconvulsive therapy.:  (Status:  maintained). Lack of energy.: (Status: improved). Lacks follow-through in  projects, even though energy is very high, since behavior lacks discipline and goal-directedness.:  (Status: improved). Poor concentration and indecisiveness.:  (Status: improved). Reports flight of ideas or thoughts racing.: (Status: improved). Shows evidence of a decreased need for sleep.: (Status: improved).  Sleeplessness or hypersomnia.:  (Status: improved). Social withdrawal.: (Status: improved). Suicidal thoughts and/or gestures.:   (Status: improved). The elevated mood or irritability (mania) causes marked impairment in  occupational functioning, social activities, or relationships with others.: (Status: improved). Verbalizes grandiose  ideas and/or persecutory beliefs.:   (Status: improved).  Problems Addressed  Bipolar Disorder - Mania, Bipolar Disorder - Depression, Bipolar Disorder - Mania, Bipolar Disorder -  Mania, Bipolar Disorder - Depression, Bipolar Disorder - Depression, Bipolar Disorder - Mania  Goals 1. Achieve controlled behavior, moderated mood, more deliberative  speech and thought process, and a stable daily activity pattern. 2. Achieve controlled behavior, moderated mood, more deliberative  speech and thought process, and a stable daily activity pattern. 3. Alleviate manic/hypomanic mood and return to previous level of  effective functioning. 4. Develop healthy cognitive patterns and beliefs about self and the world  that lead to alleviation and help prevent the relapse of  manic/hypomanic episodes. Objective Maintain a pattern of regular rhythm to daily activities. Target Date: 2023-05-08 Frequency: Monthly Progress: 80 Modality: individual Related Interventions 1. Assist the client in establishing a more routine pattern of daily activities such as sleeping,  eating, solitary and social activities, and exercise; use and review a form to schedule, assess,  and modify these activities so that they occur in a predictable rhythm every day. Objective Identify and replace thoughts and behaviors that trigger manic or depressive symptoms. Target Date: 2023-05-08 Frequency: Monthly Progress: 80 Modality: individual Related Interventions 1. Use cognitive therapy techniques to explore and educate the client about cognitive biases that  trigger his/her elevated or depressive mood (see Cognitive Therapy for Bipolar Disorder by  Lanny Hurst al.). 2. Teach the client cognitive-behavioral coping and relapse prevention skills including delaying  impulsive actions, structured scheduling of daily activities, keeping a regular sleep routine,  avoiding unrealistic goal striving, using relaxation procedures, identifying and avoiding episode triggers such as  stimulant drug use, alcohol consumption, breaking sleep routine, or exposing  self to high stress (see Cognitive Therapy for Bipolar Disorder by Lanny Hurst al.). 3. Use cognitive therapy techniques to explore and educate the client's about cognitive biases that  trigger his/her elevated or depressive mood (see Cognitive Therapy for Bipolar Disorder by  Lanny Hurst al.). Objective Discuss and resolve troubling personal and interpersonal issues. Target Date: 2023-05-08 Frequency: Monthly Progress: 60 Modality: individual Objective Participate in periodic "maintenance" sessions. Target Date: 2023-05-08 Frequency: Monthly Progress: 80 Modality: individual Related Interventions 1. Hold periodic "maintenance" sessions within the first few months after therapy to facilitate the  client's positive changes; problem-solve obstacles to improvement. 2. Hold periodic "maintenance" sessions within the first few months after therapy to facilitate the  client's positive changes; problem-solve obstacles to improvement. Objective Describe mood state, energy level, amount of control over thoughts, and sleeping pattern. Target Date: 2023-05-08 Frequency: Monthly Progress: 80 Modality: individual Related Interventions 1. Encourage the client to share his/her thoughts and feelings; express empathy and build rapport  while assessing primary cognitive, behavioral, interpersonal, or other symptoms of the mood  disorder. 2. Assess presence, severity, and impact of past and present mood episodes on social,  occupational, and interpersonal functioning; supplement with semi-structured inventory, if  desired (e.g., Montgomery-Asberg Depression Rating Scale, Inventory to Diagnose  Depression). 3. Encourage the client to share his/her thoughts and feelings; express empathy, and build rapport  while assessing primary cognitive, behavioral, interpersonal, or other symptoms of the mood  disorder. 4. Assess presence, severity,  and impact of past and present mood episodes including mania (i.e.,  pressured speech, impulsive behavior, euphoric mood, flight of ideas, reduced need for sleep,  inflated self-esteem, and high energy) on social, occupational, and interpersonal functioning;  supplement with semi-structured inventory, if desired (e.g., Young Mania Rating Scale;  the  Clinical Monitoring Form); re-administer as indicated to assess treatment response. 5. Develop healthy cognitive patterns and beliefs about self and the world  that lead to alleviation and help prevent the relapse of mood  episodes. 6. Normalize energy level and return to usual activities, good judgment,  stable mood, more realistic expectations, and goal-directed behavior. 7. Normalize energy level and return to usual activities, good judgment,  stable mood, more realistic expectations, and goal-directed behavior. Diagnosis 296.52 (Bipolar I disorder, most recent episode (or current) depressed, moderate)  Medications  1. Ambien (Dosage: '10mg'$  qhs)  2. Klonopin (Dosage: '1mg'$  TID)  3. Olanzapine (Dosage: 5 mg every other day and not on weekends)  4. Prozac (Dosage: 20 mg every other day and not on weekends)  5. Wellbutrin (Dosage: 450 mg qd)  Conditions For Discharge Achievement of treatment goals and objectives  The patient approved this treatment plan and is making progress towards goals.  Irlanda Croghan G Dalin Caldera, LCSW

## 2022-12-07 ENCOUNTER — Telehealth: Payer: Self-pay | Admitting: Surgery

## 2022-12-07 NOTE — Telephone Encounter (Signed)
Called patient to inform of surgery date on January 16th. Patient also scheuled for pre-op with Joylene John on 12/17/22 at 2pm. Patient verbalized understanding and had no further questions.

## 2022-12-17 ENCOUNTER — Encounter (HOSPITAL_BASED_OUTPATIENT_CLINIC_OR_DEPARTMENT_OTHER): Payer: Self-pay | Admitting: Psychiatry

## 2022-12-17 ENCOUNTER — Inpatient Hospital Stay: Payer: Medicare HMO | Attending: Gynecologic Oncology | Admitting: Gynecologic Oncology

## 2022-12-17 ENCOUNTER — Encounter: Payer: Self-pay | Admitting: Oncology

## 2022-12-17 VITALS — BP 119/63 | HR 61 | Temp 97.5°F | Resp 16 | Ht 61.02 in | Wt 216.2 lb

## 2022-12-17 DIAGNOSIS — B372 Candidiasis of skin and nail: Secondary | ICD-10-CM

## 2022-12-17 DIAGNOSIS — Z9079 Acquired absence of other genital organ(s): Secondary | ICD-10-CM | POA: Insufficient documentation

## 2022-12-17 DIAGNOSIS — C519 Malignant neoplasm of vulva, unspecified: Secondary | ICD-10-CM

## 2022-12-17 DIAGNOSIS — D038 Melanoma in situ of other sites: Secondary | ICD-10-CM | POA: Insufficient documentation

## 2022-12-17 DIAGNOSIS — G8918 Other acute postprocedural pain: Secondary | ICD-10-CM | POA: Insufficient documentation

## 2022-12-17 MED ORDER — TRAMADOL HCL 50 MG PO TABS: 50.0000 mg | ORAL_TABLET | Freq: Four times a day (QID) | ORAL | 0 refills | Status: DC | PRN

## 2022-12-17 MED ORDER — SENNOSIDES-DOCUSATE SODIUM 8.6-50 MG PO TABS: 2.0000 | ORAL_TABLET | Freq: Every day | ORAL | 0 refills | Status: DC

## 2022-12-17 MED ORDER — NYSTATIN 100000 UNIT/GM EX POWD: 1.0000 | Freq: Three times a day (TID) | CUTANEOUS | 2 refills | Status: DC

## 2022-12-17 NOTE — Patient Instructions (Signed)
Preparing for your Surgery  Plan for surgery on December 25, 2022 with Dr. Bernadene Bell at [facility]. You will be scheduled for pelvic examination under anesthesia, partial vulvectomy.   Pre-operative Testing -You will receive a phone call from presurgical testing at [facility] to discuss surgery instructions and arrange for lab work if needed.  -Bring your insurance card, copy of an advanced directive if applicable, medication list.  -Your last dose of aspirin 81 mg should be the day before surgery. Do not take this the morning of surgery.  -Do not take supplements such as fish oil (omega 3), red yeast rice, turmeric before your surgery. You want to avoid medications with aspirin in them including headache powders such as BC or Goody's), Excedrin migraine.  Day Before Surgery at [place] will be advised you can have clear liquids up until 3 hours before your surgery.    Your role in recovery Your role is to become active as soon as directed by your doctor, while still giving yourself time to heal.  Rest when you feel tired. You will be asked to do the following in order to speed your recovery:  - Cough and breathe deeply. This helps to clear and expand your lungs and can prevent pneumonia after surgery.  - [place]. Do mild physical activity. Walking or moving your legs help your circulation and body functions return to normal. Do not try to get up or walk alone the first time after surgery.   -If you develop swelling on one leg or the other, pain in the back of your leg, redness/warmth in one of your legs, please call the office or go to the Emergency Room to have a doppler to rule out a blood clot. For shortness of breath, chest pain-seek care in the Emergency Room as soon as possible. - Actively manage your pain. Managing your pain lets you move in comfort. We will ask you to rate your pain on a scale of zero to 10. It is  your responsibility to tell your doctor or nurse where and how much you hurt so your pain can be treated.  Special Considerations -Your final pathology results from surgery should be available around one week after surgery and the results will be relayed to you when available.  -FMLA forms can be faxed to [phone] and please allow 5-7 business days for completion.  Pain Management After Surgery -You will be prescribed your pain medication and bowel regimen medications before surgery so that you can have these available when you are discharged from the hospital. The pain medication is for use ONLY AFTER surgery and a new prescription will not be given.   -Make sure that you have Tylenol at home IF [place] to use on a regular basis after surgery for pain control.   -Review the attached handout on narcotic use and their risks and side effects.   Bowel Regimen -You will be prescribed Sennakot-S to take nightly to prevent constipation especially if you are taking the narcotic pain medication intermittently.  It is important to prevent constipation and drink adequate amounts of liquids. You can stop taking this medication when you are not taking pain medication and you are back on your normal bowel routine.  Risks of Surgery Risks of surgery are low but include bleeding, infection, damage to surrounding structures, re-operation, blood clots, and very rarely death.  AFTER SURGERY INSTRUCTIONS  Return to work:  variable based on occupation  We recommend purchasing several bags of frozen green peas and dividing them into ziploc bags. You will want to keep these in the freezer and have them ready to use as ice packs to the vulvar incision. Once the ice pack is no longer cold, you can get another from the freezer. The frozen peas mold to your body better than a regular ice pack.     Activity: 1. Be up and out of the bed during the day.  Take a nap if needed.  You may  walk up steps but be careful and use the hand rail.  Stair climbing will tire you more than you think, you may need to stop part way and rest.   2. No lifting or straining for 4 weeks over 10 pounds. No pushing, pulling, straining for 4 weeks.  3. No driving for minimum 24 hours after surgery but this is usually longer since you need to be able to brake safely and be off pain meds.  Do not drive if you are taking narcotic pain medicine and make sure that your reaction time has returned.   4. You can shower as soon as the next day after surgery. Shower daily. No tub baths or submerging your body in water until cleared by your surgeon. If you have the soap that was given to you by pre-surgical testing that was used before surgery, you do not need to use it afterwards because this can irritate your incisions.   5. No sexual activity and nothing in the vagina for 4 weeks.  6. You may experience vaginal spotting and discharge after surgery.  The spotting is normal but if you experience heavy bleeding, call our office.  7. Take Tylenol first for pain if you are able to take these medication and only use narcotic pain medication for severe pain not relieved by the Tylenol.  Monitor your Tylenol intake to a max of 4,000 mg in a 24 hour period.   Diet: 1. Low sodium Heart Healthy Diet is recommended but you are cleared to resume your normal (before surgery) diet after your procedure.  2. It is safe to use a laxative, such as Miralax or Colace, if you have difficulty moving your bowels. You have been prescribed Sennakot at bedtime every evening to keep bowel movements regular and to prevent constipation.    Wound Care: 1. Keep clean and dry.  Shower daily.  Reasons to call the Doctor: Fever - Oral temperature greater than 100.4 degrees Fahrenheit Foul-smelling vaginal discharge Difficulty urinating Nausea and vomiting Increased pain at the site of the incision that is unrelieved with pain  medicine. Difficulty breathing with or without chest pain New calf pain especially if only on one side Sudden, continuing increased vaginal bleeding with or without clots.   Contacts: For questions or concerns you should contact:  Dr. Bernadene Bell at [place], NP at [phone]  After Hours: call [phone] and have the GYN Oncologist paged/contacted (after 5 pm or on the weekends).  Messages sent via mychart are for non-urgent matters and are not responded to after hours so for urgent needs, please call the after hours number.

## 2022-12-17 NOTE — Progress Notes (Signed)
Met with [name] [name] and reviewed pre op instructions for her 12/25/22 surgery with Dr. Ernestina Patches.  She verbalized understanding of instructions. Encouraged her to call with any questions.

## 2022-12-17 NOTE — Progress Notes (Unsigned)
Patient here for a pre-operative appointment prior to her scheduled surgery on December 25, 2022. She is scheduled for EUA, partial vulvectomy. The surgery was discussed in detail by Elmo Putt, navigator and myself.  See after visit summary for additional details. Visual aids used to discuss items related to surgery.   Discussed post-op pain management in detail including the aspects of the enhanced recovery pathway.  Advised her that a new prescription would be sent in for tramadol and it is only to be used for after her upcoming surgery.  We discussed the use of tylenol post-op and to monitor for a maximum of 4,000 mg in a 24 hour period.  Also prescribed sennakot to be used after surgery and to hold if having loose stools.  Discussed bowel regimen in detail.     Discussed the use of SCDs and measures to take at home to prevent DVT including frequent mobility.  Reportable signs and symptoms of DVT discussed. Post-operative instructions discussed and expectations for after surgery. Incisional care discussed as well including reportable signs and symptoms including erythema, drainage, wound separation.     10 minutes spent with the patient and preparing information.  Verbalizing understanding of material discussed. No needs or concerns voiced at the end of the visit.   Advised patient and family to call for any needs.  Advised that her post-operative medications had been prescribed and could be picked up at any time.   Candidiasis under pannus, more on the left, has improved with minimal amount of erythema. Advised to continue use of nystatin powder.   This appointment is included in the global surgical bundle as pre-operative teaching and has no charge.

## 2022-12-20 ENCOUNTER — Encounter (HOSPITAL_BASED_OUTPATIENT_CLINIC_OR_DEPARTMENT_OTHER): Payer: Self-pay | Admitting: Psychiatry

## 2022-12-20 NOTE — Progress Notes (Signed)
Spoke w/ via phone for pre-op interview--- [name] [name]  Lab needs dos---- T&S              Lab results------  COVID test -----patient states asymptomatic no test needed Arrive at ------- 1230 on 12/25/22 NPO after MN NO Solid Food.  Clear liquids from MN until--- 1130 on 12/25/22 Med rec completed Medications to take morning of surgery ----- wellbutrin, clonazepam prn, pepcid, synthroid  Diabetic medication ----- n/a Patient instructed no nail polish to be worn day of surgery Patient instructed to bring photo id and insurance card day of surgery Patient aware to have Driver (ride ) / caregiver    for 24 hours after surgery - Lidia Clavijo (husband) [phone] Patient Special Instructions ----- none Pre-Op special Istructions ----- none Patient verbalized understanding of instructions that were given at this phone interview. Patient denies shortness of breath, chest pain, fever, cough at this phone interview.  Cardiology OV 04/14/21 Dr. Elmer Picker Cardiology, EKG SB, return 1 yr for f/u, no ischemia noted, recommended wt loss and exercise. Echo 02/04/20 EF 55%, stress test  WNL. Pt denies CP, SOB.   Lyndel Pleasure, RN

## 2022-12-24 ENCOUNTER — Telehealth: Payer: Self-pay

## 2022-12-24 NOTE — Telephone Encounter (Signed)
Telephone call to check on pre-operative status.  Patient compliant with pre-operative instructions.  Reinforced nothing to eat after midnight. Clear liquids until 11:30. Patient to arrive at 12:15.  No questions or concerns voiced.  Instructed to call for any needs.

## 2022-12-25 ENCOUNTER — Other Ambulatory Visit: Payer: Self-pay

## 2022-12-25 ENCOUNTER — Ambulatory Visit (HOSPITAL_BASED_OUTPATIENT_CLINIC_OR_DEPARTMENT_OTHER): Payer: Medicare HMO | Admitting: Certified Registered Nurse Anesthetist

## 2022-12-25 ENCOUNTER — Ambulatory Visit (HOSPITAL_BASED_OUTPATIENT_CLINIC_OR_DEPARTMENT_OTHER)
Admission: RE | Admit: 2022-12-25 | Discharge: 2022-12-25 | Disposition: A | Discharge status: Home / Self Care | Payer: Medicare HMO | Attending: Psychiatry | Admitting: Psychiatry

## 2022-12-25 ENCOUNTER — Encounter (HOSPITAL_BASED_OUTPATIENT_CLINIC_OR_DEPARTMENT_OTHER)
Admission: RE | Disposition: A | Discharge status: Home / Self Care | Payer: Self-pay | Source: Home / Self Care | Attending: Psychiatry

## 2022-12-25 ENCOUNTER — Encounter (HOSPITAL_BASED_OUTPATIENT_CLINIC_OR_DEPARTMENT_OTHER): Payer: Self-pay | Admitting: Psychiatry

## 2022-12-25 DIAGNOSIS — D038 Melanoma in situ of other sites: Secondary | ICD-10-CM | POA: Insufficient documentation

## 2022-12-25 DIAGNOSIS — Z780 Asymptomatic menopausal state: Secondary | ICD-10-CM | POA: Insufficient documentation

## 2022-12-25 DIAGNOSIS — K219 Gastro-esophageal reflux disease without esophagitis: Secondary | ICD-10-CM | POA: Insufficient documentation

## 2022-12-25 DIAGNOSIS — E039 Hypothyroidism, unspecified: Secondary | ICD-10-CM | POA: Insufficient documentation

## 2022-12-25 DIAGNOSIS — R7309 Other abnormal glucose: Secondary | ICD-10-CM

## 2022-12-25 DIAGNOSIS — D039 Melanoma in situ, unspecified: Secondary | ICD-10-CM

## 2022-12-25 HISTORY — PX: VULVECTOMY: SHX1086

## 2022-12-25 HISTORY — DX: Melanoma in situ, unspecified: D03.9

## 2022-12-25 LAB — TYPE AND SCREEN
ABO/RH(D): O NEG
Antibody Screen: NEGATIVE

## 2022-12-25 LAB — ABO/RH: ABO/RH(D): O NEG

## 2022-12-25 SURGERY — WIDE EXCISION VULVECTOMY
Anesthesia: General

## 2022-12-25 MED ORDER — LACTATED RINGERS IV SOLN: INTRAVENOUS | Status: DC

## 2022-12-25 MED ORDER — DEXAMETHASONE SODIUM PHOSPHATE 10 MG/ML IJ SOLN: 4.0000 mg | INTRAMUSCULAR | Status: DC

## 2022-12-25 MED ORDER — FENTANYL CITRATE (PF) 100 MCG/2ML IJ SOLN: 25.0000 ug | INTRAMUSCULAR | Status: DC | PRN

## 2022-12-25 MED ORDER — PROPOFOL 10 MG/ML IV BOLUS
INTRAVENOUS | Status: AC
  Filled 2022-12-25: qty 20

## 2022-12-25 MED ORDER — FENTANYL CITRATE (PF) 250 MCG/5ML IJ SOLN
INTRAMUSCULAR | Status: DC | PRN
  Administered 2022-12-25: 50 ug via INTRAVENOUS
  Administered 2022-12-25 (×2): 25 ug via INTRAVENOUS

## 2022-12-25 MED ORDER — OXYCODONE HCL 5 MG/5ML PO SOLN: 5.0000 mg | Freq: Once | ORAL | Status: DC | PRN

## 2022-12-25 MED ORDER — MIDAZOLAM HCL 2 MG/2ML IJ SOLN
INTRAMUSCULAR | Status: AC
  Filled 2022-12-25: qty 2

## 2022-12-25 MED ORDER — ONDANSETRON HCL 4 MG/2ML IJ SOLN: 4.0000 mg | Freq: Once | INTRAMUSCULAR | Status: DC | PRN

## 2022-12-25 MED ORDER — BUPIVACAINE HCL 0.25 % IJ SOLN
INTRAMUSCULAR | Status: DC | PRN
  Administered 2022-12-25: 10 mL

## 2022-12-25 MED ORDER — DEXAMETHASONE SODIUM PHOSPHATE 10 MG/ML IJ SOLN
INTRAMUSCULAR | Status: DC | PRN
  Administered 2022-12-25: 5 mg via INTRAVENOUS

## 2022-12-25 MED ORDER — ACETAMINOPHEN 500 MG PO TABS
ORAL_TABLET | ORAL | Status: AC
  Filled 2022-12-25: qty 2

## 2022-12-25 MED ORDER — MIDAZOLAM HCL 2 MG/2ML IJ SOLN
INTRAMUSCULAR | Status: DC | PRN
  Administered 2022-12-25: 1 mg via INTRAVENOUS

## 2022-12-25 MED ORDER — OXYCODONE HCL 5 MG PO TABS: 5.0000 mg | ORAL_TABLET | Freq: Once | ORAL | Status: DC | PRN

## 2022-12-25 MED ORDER — ACETAMINOPHEN 500 MG PO TABS
1000.0000 mg | ORAL_TABLET | ORAL | Status: AC
  Administered 2022-12-25: 1000 mg via ORAL

## 2022-12-25 MED ORDER — ACETAMINOPHEN 10 MG/ML IV SOLN: 1000.0000 mg | Freq: Once | INTRAVENOUS | Status: DC | PRN

## 2022-12-25 MED ORDER — ONDANSETRON HCL 4 MG/2ML IJ SOLN
INTRAMUSCULAR | Status: DC | PRN
  Administered 2022-12-25: 4 mg via INTRAVENOUS

## 2022-12-25 MED ORDER — PROPOFOL 10 MG/ML IV BOLUS
INTRAVENOUS | Status: DC | PRN
  Administered 2022-12-25: 140 mg via INTRAVENOUS

## 2022-12-25 MED ORDER — FENTANYL CITRATE (PF) 100 MCG/2ML IJ SOLN
INTRAMUSCULAR | Status: AC
  Filled 2022-12-25: qty 2

## 2022-12-25 SURGICAL SUPPLY — 31 items
BLADE CLIPPER SENSICLIP SURGIC (BLADE) IMPLANT
BLADE SURG 15 STRL LF DISP TIS (BLADE) ×1 IMPLANT
BLADE SURG 15 STRL SS (BLADE) ×1
CATH ROBINSON RED A/P 16FR (CATHETERS) ×1 IMPLANT
DRSG TELFA 3X8 NADH STRL (GAUZE/BANDAGES/DRESSINGS) IMPLANT
GAUZE 4X4 16PLY ~~LOC~~+RFID DBL (SPONGE) IMPLANT
GLOVE BIO SURGEON STRL SZ 6 (GLOVE) ×2 IMPLANT
GLOVE BIOGEL PI IND STRL 6.5 (GLOVE) ×1 IMPLANT
GOWN STRL REUS W/TWL LRG LVL3 (GOWN DISPOSABLE) ×1 IMPLANT
HEMOSTAT SURGICEL 2X14 (HEMOSTASIS) IMPLANT
KIT TURNOVER CYSTO (KITS) ×1 IMPLANT
NDL HYPO 25X1 1.5 SAFETY (NEEDLE) ×1 IMPLANT
NEEDLE HYPO 25X1 1.5 SAFETY (NEEDLE) ×1 IMPLANT
NS IRRIG 500ML POUR BTL (IV SOLUTION) ×1 IMPLANT
PACK PERINEAL COLD (PAD) ×1 IMPLANT
PACK VAGINAL WOMENS (CUSTOM PROCEDURE TRAY) ×1 IMPLANT
PUNCH BIOPSY DERMAL 3 (INSTRUMENTS) IMPLANT
PUNCH BIOPSY DERMAL 3MM (INSTRUMENTS)
PUNCH BIOPSY DERMAL 4MM (INSTRUMENTS) IMPLANT
SUT VIC AB 0 SH 27 (SUTURE) ×1 IMPLANT
SUT VIC AB 2-0 CT2 27 (SUTURE) IMPLANT
SUT VIC AB 2-0 SH 27 (SUTURE)
SUT VIC AB 2-0 SH 27XBRD (SUTURE) IMPLANT
SUT VIC AB 3-0 SH 27 (SUTURE) ×1
SUT VIC AB 3-0 SH 27X BRD (SUTURE) ×1 IMPLANT
SUT VIC AB 4-0 PS2 18 (SUTURE) ×1 IMPLANT
SUT VIC AB 4-0 SH 27 (SUTURE)
SUT VIC AB 4-0 SH 27XANBCTRL (SUTURE) IMPLANT
SWAB OB GYN 8IN STERILE 2PK (MISCELLANEOUS) IMPLANT
SYR BULB IRRIG 60ML STRL (SYRINGE) ×1 IMPLANT
TOWEL OR 17X26 10 PK STRL BLUE (TOWEL DISPOSABLE) ×1 IMPLANT

## 2022-12-25 NOTE — Discharge Instructions (Addendum)
12/25/2022   Activity: 1. Be up and out of the bed during the day.  Take a nap if needed.  You may walk up steps but be careful and use the hand rail.  Stair climbing will tire you more than you think, you may need to stop part way and rest.   2. No lifting or straining over 10 lbs, pushing, pulling, straining for 2 weeks.  3. Do not drive if you are taking narcotic pain medicine. You need to make sure your reaction time has returned and you can brake safely.  4. Shower daily.  Use your regular soap to bathe and when finished pat your incision dry; don't rub.  No tub baths until cleared by your surgeon.   5. No sexual activity and nothing in the vagina for 4 weeks.  6. You may experience a small amount of clear drainage from your incisions, which is normal.  If the drainage persists or increases, please call the office.  7. You may experience vaginal spotting after surgery.  The spotting is normal but if you experience heavy bleeding, call our office.  8. Take Tylenol or ibuprofen first for pain and only use narcotic pain medication for severe pain not relieved by the Tylenol or Ibuprofen.  Monitor your Tylenol intake to a max of 4,000 mg.   Diet: 1. Low sodium Heart Healthy Diet is recommended.  2. It is safe to use a laxative, such as Miralax or Colace, if you have difficulty moving your bowels. You can take Sennakot at bedtime every evening to keep bowel movements regular and to prevent constipation.    Wound Care: 1. Keep clean and dry.  Shower daily.  Reasons to call the Doctor: Fever - Oral temperature greater than 100.4 degrees Fahrenheit Foul-smelling vaginal discharge Difficulty urinating Nausea and vomiting Increased pain at the site of the incision that is unrelieved with pain medicine. Difficulty breathing with or without chest pain New calf pain especially if only on one side Sudden, continuing increased vaginal bleeding with or without clots.   Contacts: For  questions or concerns you should contact:  Dr. Ernestina Patches at [place], NP at [phone]  After Hours: call [phone] and have the GYN Oncologist paged/contacted    Post Anesthesia Home Care Instructions  Activity: Get plenty of rest for the remainder of the day. A responsible individual must stay with you for 24 hours following the procedure.  For the next 24 hours, DO NOT: -Drive a car -Paediatric nurse -Drink alcoholic beverages -Take any medication unless instructed by your physician -Make any legal decisions or sign important papers.  Meals: Start with liquid foods such as gelatin or soup. Progress to regular foods as tolerated. Avoid greasy, spicy, heavy foods. If nausea and/or vomiting occur, drink only clear liquids until the nausea and/or vomiting subsides. Call your physician if vomiting continues.  Special Instructions/Symptoms: Your throat may feel dry or sore from the anesthesia or the breathing tube placed in your throat during surgery. If this causes discomfort, gargle with warm salt water. The discomfort should disappear within 24 hours.  If you had a scopolamine patch placed behind your ear for the management of post- operative nausea and/or vomiting:  1. The medication in the patch is effective for 72 hours, after which it should be removed.  Wrap patch in a tissue and discard in the trash. Wash hands thoroughly with soap and water. 2. You may remove the patch earlier than 72 hours if you experience  unpleasant side effects which may include dry mouth, dizziness or visual disturbances. 3. Avoid touching the patch. Wash your hands with soap and water after contact with the patch.    No acetaminophen/Tylenol until after 7 pm today if needed.

## 2022-12-25 NOTE — Anesthesia Preprocedure Evaluation (Signed)
Anesthesia Evaluation  Patient identified by MRN, date of birth, ID band Patient awake    Reviewed: Allergy & Precautions, H&P , NPO status , Patient's Chart, lab work & pertinent test results  Airway Mallampati: III  TM Distance: <3 FB Neck ROM: Full    Dental no notable dental hx.    Pulmonary neg pulmonary ROS   Pulmonary exam normal breath sounds clear to auscultation       Cardiovascular negative cardio ROS Normal cardiovascular exam Rhythm:Regular Rate:Normal     Neuro/Psych     Bipolar Disorder   negative neurological ROS     GI/Hepatic Neg liver ROS,GERD  ,,  Endo/Other  Hypothyroidism    Renal/GU negative Renal ROS  negative genitourinary   Musculoskeletal negative musculoskeletal ROS (+)    Abdominal   Peds negative pediatric ROS (+)  Hematology negative hematology ROS (+)   Anesthesia Other Findings   Reproductive/Obstetrics negative OB ROS                             Anesthesia Physical Anesthesia Plan  ASA: 2  Anesthesia Plan: General   Post-op Pain Management: Toradol IV (intra-op)*   Induction: Intravenous  PONV Risk Score and Plan: 3 and Ondansetron, Dexamethasone and Treatment may vary due to age or medical condition  Airway Management Planned: LMA  Additional Equipment:   Intra-op Plan:   Post-operative Plan: Extubation in OR  Informed Consent: I have reviewed the patients History and Physical, chart, labs and discussed the procedure including the risks, benefits and alternatives for the proposed anesthesia with the patient or authorized representative who has indicated his/her understanding and acceptance.     Dental advisory given  Plan Discussed with: CRNA and Surgeon  Anesthesia Plan Comments:        Anesthesia Quick Evaluation

## 2022-12-25 NOTE — Transfer of Care (Signed)
Immediate Anesthesia Transfer of Care Note  Patient: [name] [name]  Procedure(s) Performed: PARTIAL  VULVECTOMY  Patient Location: PACU  Anesthesia Type:General  Level of Consciousness: awake, alert , and oriented  Airway & Oxygen Therapy: Patient Spontanous Breathing  Post-op Assessment: Report given to RN and Post -op Vital signs reviewed and stable  Post vital signs: Reviewed and stable  Last Vitals:  Vitals Value Taken Time  BP 157/79 12/25/22 1530  Temp    Pulse 74 12/25/22 1532  Resp 12 12/25/22 1532  SpO2 99 % 12/25/22 1532  Vitals shown include unvalidated device data.  Last Pain:  Vitals:   12/25/22 1232  TempSrc: Oral  PainSc: 0-No pain      Patients Stated Pain Goal: 3 (01/58/68 2574)  Complications: No notable events documented.

## 2022-12-25 NOTE — Interval H&P Note (Signed)
History and Physical Interval Note:  12/25/2022 2:08 PM  [name] [name]  has presented today for surgery, with the diagnosis of [place], INSITIU.  The various methods of treatment have been discussed with the patient and family. After consideration of risks, benefits and other options for treatment, the patient has consented to  Procedure(s): PARTIAL  VULVECTOMY (N/A) as a surgical intervention.  The patient's history has been reviewed, patient examined, no change in status, stable for surgery.  I have reviewed the patient's chart and labs.  Questions were answered to the patient's satisfaction.     Marqus Macphee

## 2022-12-25 NOTE — Anesthesia Procedure Notes (Signed)
Procedure Name: LMA Insertion Date/Time: 12/25/2022 2:27 PM  Performed by: Clearnce Sorrel, CRNAPre-anesthesia Checklist: Patient identified, Emergency Drugs available, Suction available and Patient being monitored Patient Re-evaluated:Patient Re-evaluated prior to induction Oxygen Delivery Method: Circle System Utilized Preoxygenation: Pre-oxygenation with 100% oxygen Induction Type: IV induction Ventilation: Mask ventilation without difficulty LMA: LMA inserted LMA Size: 4.0 Number of attempts: 1 Airway Equipment and Method: Bite block Placement Confirmation: positive ETCO2 Tube secured with: Tape Dental Injury: Teeth and Oropharynx as per pre-operative assessment

## 2022-12-25 NOTE — Op Note (Signed)
GYNECOLOGIC ONCOLOGY OPERATIVE NOTE  Date of Service: 12/25/2022  Preoperative Diagnosis: VULVAR MELENOMA, INSITIU  Postoperative Diagnosis: Same  Procedures: Simple partial vulvectomy  Surgeon: Bernadene Bell, MD  Assistants: None  Anesthesia: General  Estimated Blood Loss: 5 mL    Fluids: 41m, crystalloid  Urine Output: not assessed  Findings: External genitalia with few 1-260mmelanotic appearing lesions on the anterior right labia minora and extending across midline to the proximal anterior left labia minora. Nearly abuts the clitoris but does not directly involve the clitoris. If positive margins anteriorly, clitoris will require resection. Darkened 47m27mesion, different in appearance from the labial minora lesions, located on anterior vulva/mons, abutting the left clitoral hood superiorly.   Specimens:  ID Type Source Tests Collected by Time Destination  1 : Anterior Vulva Stitch midiline marks 1200 Tissue PATH Soft tissue SURGICAL PATHOLOGY NewBernadene BellD 12/25/2022 1451   2 : Anterior vulva left of clitoral hood stitch at 1200 Tissue PATH Soft tissue SURGICAL PATHOLOGY NewBernadene BellD 12/23/14/[id number]  Complications:  None  Indications for Procedure: LisANAELI CORNWALL a [age]o. woman with biopsy of the vulva noting melanoma in situ.  Prior to the procedure, all risks, benefits, and alternatives were discussed and informed surgical consent was signed.  Procedure: Patient was taken to the operating room where general anesthesia was achieved.  She was positioned in dorsal lithotomy and prepped and draped.   A thorough exam of the vulva was done with the findings as noted above. The area to be removed on the labia minora was outlined with a marking pen. The area was infiltrated with 0.25% marcaine. A scalpel was used to incise around the lesion. Allis clamps are used to grasp the lesion and a skinning vulvectomy was performed in the standard fashion with aid of  cautery. After the lesion was removed, the wound bed was made hemostatic with cautery. These steps were repeated on the mons, adjacent to the left clitoral hood.  Several deep 3-0 vicryl suture was placed to bring the wound edges of both resection sites together followed by 3-0 vicryl horizontal mattress sutures on the skin edges to re-appoximate the wound. Hemostasis was noted at the end of the procedure.   Patient tolerated the procedure well. Sponge, lap, and instrument counts were correct.  No perioperative antibiotics were indicated for this procedure.  Patient was extubated and taken to the PACU in stable condition.  MerBernadene BellD Gynecologic Oncology

## 2022-12-26 ENCOUNTER — Encounter (HOSPITAL_BASED_OUTPATIENT_CLINIC_OR_DEPARTMENT_OTHER): Payer: Self-pay | Admitting: Psychiatry

## 2022-12-26 ENCOUNTER — Telehealth: Payer: Self-pay

## 2022-12-26 NOTE — Telephone Encounter (Signed)
Spoke with [name] [name] this morning. She states she is eating, drinking and urinating well. She has not had a BM yet but is passing gas. She is taking senokot as prescribed and encouraged her to drink plenty of water. She denies fever or chills. Incisions are dry and intact. She rates her pain 3/10. Her pain is controlled with Tylenol    Instructed to call office with any fever, chills, purulent drainage, uncontrolled pain or any other questions or concerns. Patient verbalizes understanding.   Pt aware of post op appointments as well as the office number [phone] and after hours number [phone] to call if she has any questions or concerns

## 2022-12-27 NOTE — Anesthesia Postprocedure Evaluation (Signed)
Anesthesia Post Note  Patient: [name] [name]  Procedure(s) Performed: PARTIAL  VULVECTOMY     Patient location during evaluation: PACU Anesthesia Type: General Level of consciousness: awake and alert Pain management: pain level controlled Vital Signs Assessment: post-procedure vital signs reviewed and stable Respiratory status: spontaneous breathing, nonlabored ventilation, respiratory function stable and patient connected to nasal cannula oxygen Cardiovascular status: blood pressure returned to baseline and stable Postop Assessment: no apparent nausea or vomiting Anesthetic complications: no   No notable events documented.  Last Vitals:  Vitals:   12/25/22 1600 12/25/22 1641  BP: (!) 158/59 (!) 162/94  Pulse: 63 64  Resp: 15 16  Temp:  36.4 C  SpO2: 100% 100%    Last Pain:  Vitals:   12/25/22 1641  TempSrc:   PainSc: 2                  Donnesha Karg S

## 2022-12-31 ENCOUNTER — Telehealth: Payer: Self-pay | Admitting: Surgery

## 2022-12-31 ENCOUNTER — Inpatient Hospital Stay (HOSPITAL_BASED_OUTPATIENT_CLINIC_OR_DEPARTMENT_OTHER): Payer: Medicare HMO | Admitting: Psychiatry

## 2022-12-31 ENCOUNTER — Other Ambulatory Visit: Payer: Self-pay

## 2022-12-31 ENCOUNTER — Telehealth: Payer: Self-pay | Admitting: *Deleted

## 2022-12-31 VITALS — BP 138/61 | HR 61 | Temp 99.2°F | Resp 14 | Wt 221.4 lb

## 2022-12-31 DIAGNOSIS — Z9079 Acquired absence of other genital organ(s): Secondary | ICD-10-CM

## 2022-12-31 DIAGNOSIS — D038 Melanoma in situ of other sites: Secondary | ICD-10-CM

## 2022-12-31 NOTE — Telephone Encounter (Signed)
Patient called in stating that since Thursday she is having pain around her urethra that feels like someone put a close pin in it. Pain is 5/10 and worse when sitting. Patient states that she thinks she may be allergic to the panty liners she was wearing. Symptoms started when she switched from pads to panty liners. She has since stopped wearing underwear altogether and states that helps but she is still very sore. Light spotting only when she wipes. Some urgency with urination but denies burning or frequency. Some odor as well, states it was worse with liners as well, not as bad with no underwear. Describes as foul but not fishy. Patient advised to keep the area clean with soap and water and to pat dry, to continue to use peri-bottle to rinse when urinating. Patient also advised she can use ice (frozen peas) to help with pain management. Patient advised Dr Ernestina Patches will be notified of symptoms and our office will call her back with any recommendations. Patient verbalized understanding and had no further questions at this time.

## 2022-12-31 NOTE — Progress Notes (Unsigned)
Gynecologic Oncology Return Clinic Visit  Date of Service: 12/31/2022 Referring Provider: Clovia Cuff, MD   Assessment & Plan: [name] [name] is a 67 y.o. woman with vulvar melanoma in situ who is 1 weeks s/p simple partial vulvectomy on 12/25/22.  Postop: - Surgical site examined without any concerns for infection or wound break down at this time. - Genital hygiene reviewed - Agree with air dry and minimizing contact with potential irritants or items that will maintain moisture like panty liners - Continue with rinsing with peri-bottle - Recommend occasional ice to help with swelling. - Pathology pending at this time.   RTC 1 week.  Bernadene Bell, MD Gynecologic Oncology   Medical Decision Making I personally spent  TOTAL 20 minutes face-to-face and non-face-to-face in the care of this patient, which includes all pre, intra, and post visit time on the date of service.   ----------------------- Reason for Visit: Pain   Interval History: Patient presents today with her husband due to concern regarding healing from surgery.  She reports that she has been having pain near her urethra.  She feels that this has been made worse been wearing pantiliners so as of yesterday she has been air drying with a nightgown on and no underwear.  She had tried using the Peri bottle but this hurts some so she discontinued.  She reports that she does not have pain with urination.  She denies any vaginal bleeding.  She notes some odor that was worse when wearing the panty liner.    Past Medical/Surgical History: Past Medical History:  Diagnosis Date   Actinic keratosis 03/29/2021   R central upper lip   Allergic rhinitis    Atypical mole 12/26/2021   spinal upper back, exc 01/15/22   Basal cell carcinoma 10/16/2010   R lower pretibia    Bipolar 1 disorder (HCC)    Fissure, anal    GERD (gastroesophageal reflux disease)    History of basal cell carcinoma excision    ON FACE   History of  squamous cell carcinoma excision    ON RIGHT LEG   Hyperlipidemia    Hypothyroidism    IBS (irritable bowel syndrome)    Liver cyst    Malignant melanoma in situ ([place])    Mallet finger of left hand    small finger    Past Surgical History:  Procedure Laterality Date   CARDIAC CATHETERIZATION  04/10/1999   per pt normal   CARDIOVASCULAR STRESS TEST  02-23-2011  dr Chrissie Noa fath [facility])   normal nuclear study/  no ischemia/  ef 64%   CATARACT EXTRACTION W/ INTRAOCULAR LENS  IMPLANT, BILATERAL  12/10/2001   Bil   CESAREAN SECTION  12/10/1982   CLOSED REDUCTION FINGER WITH PERCUTANEOUS PINNING Left 04/22/2014   Procedure: LEFT SMALL FINGER CLOSED REDUCTION PINNING;  Surgeon: Linna Hoff, MD;  Location: [place];  Service: Orthopedics;  Laterality: Left;   COLONOSCOPY  11/09/2002   DILATION AND CURETTAGE OF UTERUS  1977   LAPAROSCOPIC CHOLECYSTECTOMY  03/18/2000   RECONSTRUCTION OF EYELID Bilateral 08/2022   TRANSTHORACIC ECHOCARDIOGRAM  02/23/2011   normal lvf/  ef 58%/  mild rve/   mild lae/  mild  mr  & tr/  mild pulmonary htn   VULVECTOMY N/A 12/25/2022   Procedure: PARTIAL  VULVECTOMY;  Surgeon: Bernadene Bell, MD;  Location: [place];  Service: Gynecology;  Laterality: N/A;   WISDOM TOOTH EXTRACTION      Family History  Problem Relation Age of Onset   Stroke Mother    Heart disease Mother    Hypertension Father    Diabetes Father    Hyperlipidemia Father    Diabetes Brother    Colon cancer Neg Hx    Breast cancer Neg Hx    Ovarian cancer Neg Hx    Endometrial cancer Neg Hx    Pancreatic cancer Neg Hx    Prostate cancer Neg Hx     Social History   Socioeconomic History   Marital status: Married    Spouse name: Not on file   Number of children: 1   Years of education: Not on file   Highest education level: Not on file  Occupational History   Occupation: Home    Employer: RETIRED  Tobacco Use   Smoking status:  Never   Smokeless tobacco: Never  Vaping Use   Vaping Use: Never used  Substance and Sexual Activity   Alcohol use: Yes    Alcohol/week: 0.0 standard drinks of alcohol    Comment: wine- rarely   Drug use: No   Sexual activity: Not Currently    Birth control/protection: Post-menopausal  Other Topics Concern   Not on file  Social History Narrative   Moved here from [place]   Social Determinants of Health   Financial Resource Strain: Low Risk  (05/25/2022)   Overall Financial Resource Strain (CARDIA)    Difficulty of Paying Living Expenses: Not hard at all  Food Insecurity: No Food Insecurity (05/25/2022)   Hunger Vital Sign    Worried About Running Out of Food in the Last Year: Never true    Ran Out of Food in the Last Year: Never true  Transportation Needs: No Transportation Needs (05/25/2022)   PRAPARE - Hydrologist (Medical): No    Lack of Transportation (Non-Medical): No  Physical Activity: Sufficiently Active (05/25/2022)   Exercise Vital Sign    Days of Exercise per Week: 4 days    Minutes of Exercise per Session: 60 min  Stress: No Stress Concern Present (05/25/2022)   [place]    Feeling of Stress : Not at all  Social Connections: Moderately Integrated (05/25/2022)   Social Connection and Isolation Panel [NHANES]    Frequency of Communication with Friends and Family: More than three times a week    Frequency of Social Gatherings with Friends and Family: Once a week    Attends Religious Services: More than 4 times per year    Active Member of [employer] or Organizations: No    Attends Archivist Meetings: Never    Marital Status: Married    Current Medications:  Current Outpatient Medications:    aspirin EC 81 MG tablet, Take 81 mg by mouth daily. Swallow whole. (Patient not taking: Reported on 12/20/2022), Disp: , Rfl:    BIOTIN PO, Take 1 tablet by mouth daily., Disp: ,  Rfl:    buPROPion (WELLBUTRIN XL) 150 MG 24 hr tablet, Take 300 mg by mouth daily. Take three tablets by mouth every morning, Disp: , Rfl:    cetirizine (ZYRTEC ALLERGY) 10 MG tablet, Take 10 mg by mouth daily as needed for allergies., Disp: , Rfl:    Cholecalciferol (VITAMIN D) 2000 UNITS CAPS, Take 1 capsule by mouth daily., Disp: , Rfl:    clonazePAM (KLONOPIN) 1 MG tablet, Take 1 mg by mouth at bedtime., Disp: , Rfl:    co-enzyme Q-10 30  MG capsule, Take 30 mg by mouth daily. (Patient not taking: Reported on 12/20/2022), Disp: , Rfl:    Deutetrabenazine (AUSTEDO XR PATIENT TITRATION) 6 & 12 & 24 MG TEPK, Take by mouth., Disp: , Rfl:    famotidine (PEPCID) 20 MG tablet, Take 1 tablet (20 mg total) by mouth 2 (two) times daily., Disp: 180 tablet, Rfl: 1   hydrocortisone (ANUSOL-HC) 2.5 % rectal cream, Apply 1 application. topically 2 (two) times daily. To affected area twice daily (around anus/ hemorrhoid), Disp: 30 g, Rfl: 0   lamoTRIgine (LAMICTAL) 100 MG tablet, Take 100 mg by mouth daily., Disp: , Rfl:    levothyroxine (SYNTHROID) 50 MCG tablet, TAKE 1 TABLET EVERY DAY BEFORE BREAKFAST, Disp: 90 tablet, Rfl: 1   lovastatin (MEVACOR) 40 MG tablet, TAKE 1 TABLET EVERY DAY, Disp: 90 tablet, Rfl: 1   metroNIDAZOLE (METROGEL) 1 % gel, Apply topically daily. To nose, cheeks for rosacea, Disp: 45 g, Rfl: 2   nystatin (MYCOSTATIN/NYSTOP) powder, Apply 1 Application topically 3 (three) times daily. Under abdomen skin fold, Disp: 15 g, Rfl: 2   Probiotic Product (PROBIOTIC ADVANCED PO), Take by mouth., Disp: , Rfl:    senna-docusate (SENOKOT-S) 8.6-50 MG tablet, Take 2 tablets by mouth at bedtime. For AFTER surgery, do not take if having diarrhea, Disp: 30 tablet, Rfl: 0   tiZANidine (ZANAFLEX) 4 MG tablet, Take 1 tablet by mouth at bedtime as needed., Disp: , Rfl: 0   traMADol (ULTRAM) 50 MG tablet, Take 1 tablet (50 mg total) by mouth every 6 (six) hours as needed for severe pain. For AFTER surgery  only, do not take and drive, Disp: 10 tablet, Rfl: 0   zolpidem (AMBIEN) 10 MG tablet, Take 10 mg by mouth at bedtime., Disp: , Rfl:   Review of Symptoms: Complete 10-system review is negative except as above in Interval History.  Physical Exam: BP 138/61 (BP Location: Left Arm, Patient Position: Sitting)   Pulse 61   Temp 99.2 F (37.3 C)   Resp 14   Wt 221 lb 6.4 oz (100.4 kg)   SpO2 99%   BMI 41.16 kg/m  General: Alert, oriented, no acute distress. HEENT: Normocephalic, atraumatic. Neck symmetric without masses. Sclera anicteric.  Chest: Normal work of breathing. Cardiovascular: Regular rate  GU: External genitalia with stitches of anterior midline vulva at site of recent vulvectomy. No erythema or induration. Some mild exudate. Left labia minora with edema. Urethra with no abnormality. Exam chaperoned by Joylene John, NP  Laboratory & Radiologic Studies: none

## 2022-12-31 NOTE — Telephone Encounter (Signed)
Call from [place] at [facility].  Requesting previous pathology report. Results from 11-20-22 faxed to [facility]. Confirmation received.

## 2022-12-31 NOTE — Telephone Encounter (Signed)
Call back to patient. Agreeable to appointment today. Scheduled for 1345 with Dr Ernestina Patches.

## 2022-12-31 NOTE — Patient Instructions (Addendum)
Your incision is healing well. You still have sutures (stitches) in place. Continue with peri care and use the peri bottle every time your urinate and or have a BM. You can begin rinsing the vulva right when you start to urinate to get the urine off quickly. Continue to using the peas. You can continue air drying the area as well.  Keep your appointment next week as scheduled. Please call the office for any needs or concerns or worsening symptoms.

## 2023-01-02 ENCOUNTER — Encounter: Payer: Self-pay | Admitting: Psychiatry

## 2023-01-03 LAB — SURGICAL PATHOLOGY

## 2023-01-07 ENCOUNTER — Inpatient Hospital Stay (HOSPITAL_BASED_OUTPATIENT_CLINIC_OR_DEPARTMENT_OTHER): Payer: Medicare HMO | Admitting: Psychiatry

## 2023-01-07 VITALS — BP 146/70 | HR 72 | Temp 98.4°F | Resp 16 | Wt 222.6 lb

## 2023-01-07 DIAGNOSIS — D038 Melanoma in situ of other sites: Secondary | ICD-10-CM

## 2023-01-07 DIAGNOSIS — G8918 Other acute postprocedural pain: Secondary | ICD-10-CM | POA: Diagnosis not present

## 2023-01-07 DIAGNOSIS — Z9079 Acquired absence of other genital organ(s): Secondary | ICD-10-CM

## 2023-01-07 DIAGNOSIS — Z7189 Other specified counseling: Secondary | ICD-10-CM

## 2023-01-07 NOTE — Patient Instructions (Addendum)
It was a pleasure to see you in clinic today. - You are healing well - Return visit planned for end of February. We will tentatively get you scheduled for surgery on 3/5. You may receive a phone call from preop as well to discuss instructions for procedure.  Thank you very much for allowing me to provide care for you today.  I appreciate your confidence in choosing our Gynecologic Oncology team at [facility].  If you have any questions about your visit today please call our office or send [place] a MyChart message and we will get back to you as soon as possible.

## 2023-01-07 NOTE — Progress Notes (Unsigned)
Gynecologic Oncology Return Clinic Visit  Date of Service: 01/07/2023 Referring Provider: Clovia Cuff, MD   Assessment & Plan: [name] [name] is a 67 y.o. woman with vulvar melanoma in situ who is 2 weeks s/p simple partial vulvectomy on 12/25/22.  Postop: - Surgical site examined without any concerns for infection or wound break down at this time. - Genital hygiene reviewed. Continue air dry, minimize irritants, continue perri-bottle use.   Melanoma in situ - Reviewed pathology results in detail. Still only MIS, no invasion noted. But positive margins. - Re-excision recommended. Would require excision of clitoris. - Given healing of current procedure and pt's upcoming travel in February, okay to wait until beginning of March.  - Pathology pending at this time. - Pt to return for repeat exam, preoperative discussion/consenting at end of February.   RTC for preop.  Bernadene Bell, MD Gynecologic Oncology   Medical Decision Making I personally spent  TOTAL 20 minutes face-to-face and non-face-to-face in the care of this patient, which includes all pre, intra, and post visit time on the date of service.   ----------------------- Reason for Visit: Pain   Interval History: [name] [name] presents today with her husband. She reports that she continues to have some pain at the same location on the left side of her anterior vulva. But she reports that she is overall improved since her last visit. Reports that she continues with the peri-bottle.    Past Medical/Surgical History: Past Medical History:  Diagnosis Date   Actinic keratosis 03/29/2021   R central upper lip   Allergic rhinitis    Atypical mole 12/26/2021   spinal upper back, exc 01/15/22   Basal cell carcinoma 10/16/2010   R lower pretibia    Bipolar 1 disorder (HCC)    Fissure, anal    GERD (gastroesophageal reflux disease)    History of basal cell carcinoma excision    ON FACE   History of squamous cell carcinoma excision     ON RIGHT LEG   Hyperlipidemia    Hypothyroidism    IBS (irritable bowel syndrome)    Liver cyst    Malignant melanoma in situ ([place])    Mallet finger of left hand    small finger    Past Surgical History:  Procedure Laterality Date   CARDIAC CATHETERIZATION  04/10/1999   per pt normal   CARDIOVASCULAR STRESS TEST  02-23-2011  dr Chrissie Noa fath [facility] clinic)   normal nuclear study/  no ischemia/  ef 64%   CATARACT EXTRACTION W/ INTRAOCULAR LENS  IMPLANT, BILATERAL  12/10/2001   Bil   CESAREAN SECTION  12/10/1982   CLOSED REDUCTION FINGER WITH PERCUTANEOUS PINNING Left 04/22/2014   Procedure: LEFT SMALL FINGER CLOSED REDUCTION PINNING;  Surgeon: Linna Hoff, MD;  Location: [place];  Service: Orthopedics;  Laterality: Left;   COLONOSCOPY  11/09/2002   DILATION AND CURETTAGE OF UTERUS  1977   LAPAROSCOPIC CHOLECYSTECTOMY  03/18/2000   RECONSTRUCTION OF EYELID Bilateral 08/2022   TRANSTHORACIC ECHOCARDIOGRAM  02/23/2011   normal lvf/  ef 58%/  mild rve/   mild lae/  mild  mr  & tr/  mild pulmonary htn   VULVECTOMY N/A 12/25/2022   Procedure: PARTIAL  VULVECTOMY;  Surgeon: Bernadene Bell, MD;  Location: [place];  Service: Gynecology;  Laterality: N/A;   WISDOM TOOTH EXTRACTION      Family History  Problem Relation Age of Onset   Stroke Mother    Heart disease Mother  Hypertension Father    Diabetes Father    Hyperlipidemia Father    Diabetes Brother    Colon cancer Neg Hx    Breast cancer Neg Hx    Ovarian cancer Neg Hx    Endometrial cancer Neg Hx    Pancreatic cancer Neg Hx    Prostate cancer Neg Hx     Social History   Socioeconomic History   Marital status: Married    Spouse name: Not on file   Number of children: 1   Years of education: Not on file   Highest education level: Not on file  Occupational History   Occupation: Home    Employer: RETIRED  Tobacco Use   Smoking status: Never   Smokeless tobacco: Never   Vaping Use   Vaping Use: Never used  Substance and Sexual Activity   Alcohol use: Yes    Alcohol/week: 0.0 standard drinks of alcohol    Comment: wine- rarely   Drug use: No   Sexual activity: Not Currently    Birth control/protection: Post-menopausal  Other Topics Concern   Not on file  Social History Narrative   Moved here from [place]   Social Determinants of Health   Financial Resource Strain: Low Risk  (05/25/2022)   Overall Financial Resource Strain (CARDIA)    Difficulty of Paying Living Expenses: Not hard at all  Food Insecurity: No Food Insecurity (05/25/2022)   Hunger Vital Sign    Worried About Running Out of Food in the Last Year: Never true    Ran Out of Food in the Last Year: Never true  Transportation Needs: No Transportation Needs (05/25/2022)   PRAPARE - Hydrologist (Medical): No    Lack of Transportation (Non-Medical): No  Physical Activity: Sufficiently Active (05/25/2022)   Exercise Vital Sign    Days of Exercise per Week: 4 days    Minutes of Exercise per Session: 60 min  Stress: No Stress Concern Present (05/25/2022)   [place]    Feeling of Stress : Not at all  Social Connections: Moderately Integrated (05/25/2022)   Social Connection and Isolation Panel [NHANES]    Frequency of Communication with Friends and Family: More than three times a week    Frequency of Social Gatherings with Friends and Family: Once a week    Attends Religious Services: More than 4 times per year    Active Member of [employer] or Organizations: No    Attends Archivist Meetings: Never    Marital Status: Married    Current Medications:  Current Outpatient Medications:    aspirin EC 81 MG tablet, Take 81 mg by mouth daily. Swallow whole. (Patient not taking: Reported on 12/20/2022), Disp: , Rfl:    BIOTIN PO, Take 1 tablet by mouth daily., Disp: , Rfl:    buPROPion (WELLBUTRIN XL)  150 MG 24 hr tablet, Take 300 mg by mouth daily. Take three tablets by mouth every morning, Disp: , Rfl:    cetirizine (ZYRTEC ALLERGY) 10 MG tablet, Take 10 mg by mouth daily as needed for allergies., Disp: , Rfl:    Cholecalciferol (VITAMIN D) 2000 UNITS CAPS, Take 1 capsule by mouth daily., Disp: , Rfl:    clonazePAM (KLONOPIN) 1 MG tablet, Take 1 mg by mouth at bedtime., Disp: , Rfl:    co-enzyme Q-10 30 MG capsule, Take 30 mg by mouth daily. (Patient not taking: Reported on 12/20/2022), Disp: , Rfl:  Deutetrabenazine (AUSTEDO XR PATIENT TITRATION) 6 & 12 & 24 MG TEPK, Take by mouth., Disp: , Rfl:    famotidine (PEPCID) 20 MG tablet, Take 1 tablet (20 mg total) by mouth 2 (two) times daily., Disp: 180 tablet, Rfl: 1   hydrocortisone (ANUSOL-HC) 2.5 % rectal cream, Apply 1 application. topically 2 (two) times daily. To affected area twice daily (around anus/ hemorrhoid), Disp: 30 g, Rfl: 0   lamoTRIgine (LAMICTAL) 100 MG tablet, Take 100 mg by mouth daily., Disp: , Rfl:    levothyroxine (SYNTHROID) 50 MCG tablet, TAKE 1 TABLET EVERY DAY BEFORE BREAKFAST, Disp: 90 tablet, Rfl: 1   lovastatin (MEVACOR) 40 MG tablet, TAKE 1 TABLET EVERY DAY, Disp: 90 tablet, Rfl: 1   metroNIDAZOLE (METROGEL) 1 % gel, Apply topically daily. To nose, cheeks for rosacea, Disp: 45 g, Rfl: 2   nystatin (MYCOSTATIN/NYSTOP) powder, Apply 1 Application topically 3 (three) times daily. Under abdomen skin fold, Disp: 15 g, Rfl: 2   Probiotic Product (PROBIOTIC ADVANCED PO), Take by mouth., Disp: , Rfl:    senna-docusate (SENOKOT-S) 8.6-50 MG tablet, Take 2 tablets by mouth at bedtime. For AFTER surgery, do not take if having diarrhea, Disp: 30 tablet, Rfl: 0   tiZANidine (ZANAFLEX) 4 MG tablet, Take 1 tablet by mouth at bedtime as needed., Disp: , Rfl: 0   traMADol (ULTRAM) 50 MG tablet, Take 1 tablet (50 mg total) by mouth every 6 (six) hours as needed for severe pain. For AFTER surgery only, do not take and drive, Disp: 10  tablet, Rfl: 0   zolpidem (AMBIEN) 10 MG tablet, Take 10 mg by mouth at bedtime., Disp: , Rfl:   Review of Symptoms: Complete 10-system review is negative except as above in Interval History.  Physical Exam: BP (!) 146/70 (BP Location: Left Arm, Patient Position: Sitting)   Pulse 72   Temp 98.4 F (36.9 C) (Oral)   Resp 16   Wt 222 lb 9.6 oz (101 kg)   SpO2 99%   BMI 41.38 kg/m  General: Alert, oriented, no acute distress. HEENT: Normocephalic, atraumatic. Neck symmetric without masses. Sclera anicteric.  Chest: Normal work of breathing. Cardiovascular: Regular rate  GU: External genitalia with stitches of anterior midline vulva at site of recent vulvectomy. No erythema or induration. Left labia minora with edema but improved from last week. Overall healing well. Adjacent to healing incision to the left of the clitoral hood, possible mild skin changes that may represent area of persistent melanoma in situ, but amelanotic. Exam chaperoned by Joylene John, NP   Laboratory & Radiologic Studies: Surgical pathology (12/25/22): A. ANTERIOR VULVA, EXCISION: MELANOMA IN SITU, EXTENDING TO THE DEEP  SURGICAL MARGIN. THE PERIPHERAL MARGINS ARE NEGATIVE.  B. ANTERIOR VULVA, LEFT OF CLITORAL HOOD, EXCISION:  MELANOMA IN SITU  INVOLVING THE 12, 3, 6 AND 9 O'CLOCK MARGINS.

## 2023-01-09 ENCOUNTER — Encounter: Payer: Self-pay | Admitting: Psychiatry

## 2023-01-11 ENCOUNTER — Telehealth: Payer: Self-pay | Admitting: *Deleted

## 2023-01-11 NOTE — Telephone Encounter (Signed)
Reviewed with Joylene John, NP, and patient is ok to use these medications. Patient notified.

## 2023-01-11 NOTE — Telephone Encounter (Signed)
Call from patient. Reports very sore "bottom." Has history of hemorrhoids and questioning if ok that she may treat with medication- Anusol or Preparation H. Patient certain this is same as previous hemorrhoid pain.  Partial Vulvectomy on 12-25-22.   Please advise if ok to treat?

## 2023-01-15 ENCOUNTER — Encounter: Payer: Self-pay | Admitting: Family Medicine

## 2023-01-15 ENCOUNTER — Ambulatory Visit (INDEPENDENT_AMBULATORY_CARE_PROVIDER_SITE_OTHER): Payer: Medicare HMO | Admitting: Family Medicine

## 2023-01-15 ENCOUNTER — Ambulatory Visit (INDEPENDENT_AMBULATORY_CARE_PROVIDER_SITE_OTHER): Payer: Medicare HMO | Admitting: Psychology

## 2023-01-15 VITALS — BP 122/70 | HR 85 | Temp 97.7°F | Ht 60.0 in | Wt 220.5 lb

## 2023-01-15 DIAGNOSIS — F3181 Bipolar II disorder: Secondary | ICD-10-CM

## 2023-01-15 DIAGNOSIS — D038 Melanoma in situ of other sites: Secondary | ICD-10-CM

## 2023-01-15 DIAGNOSIS — R030 Elevated blood-pressure reading, without diagnosis of hypertension: Secondary | ICD-10-CM | POA: Diagnosis not present

## 2023-01-15 DIAGNOSIS — F3177 Bipolar disorder, in partial remission, most recent episode mixed: Secondary | ICD-10-CM

## 2023-01-15 DIAGNOSIS — F4320 Adjustment disorder, unspecified: Secondary | ICD-10-CM

## 2023-01-15 NOTE — Patient Instructions (Addendum)
Take a look at the [facility] eating plan   Eat less processed foods More produce  More water    Try to get most of your carbohydrates from produce (with the exception of white potatoes)  Eat less bread/pasta/rice/snack foods/cereals/sweets and other items from the middle of the grocery store (processed carbs)  If you want a new cuff I recommend OMRON blood pressure cuff for the arm size large   Take your blood pressure when you are relaxed also  Take care of yourself  Let us know if you need anything

## 2023-01-15 NOTE — Assessment & Plan Note (Addendum)
Pt has had some elevated bp readings in the setting of gyn and onc f/u of melanoma (at surgery and follow up) Onc/gyn notes reviewed At home with her wrist cuff (not working today) much lower in the one teens /70s Here after sitting/when relaxed 122/70  Very reassuring  Anxiety has been high as expected  Disc goals of less processed foods and wt loss  Exercise when cleared to do so  Handouts given for DASH eating and how to take bp She may consider change to arm cuff (would need size large) Will continue to follow this She is at risk due to obesity and family history

## 2023-01-15 NOTE — Progress Notes (Signed)
                Corona Popovich G Juriel Cid, LCSW

## 2023-01-15 NOTE — Assessment & Plan Note (Signed)
Worsened anxiety lately in setting of melanoma in situ and surgeries  Per pt psychiatry went up on her lamictal and it helps  Also taking deutetrabenazine for a tic (mouth)

## 2023-01-15 NOTE — Assessment & Plan Note (Addendum)
Vulvar Followed by gyn and also oncology Gyn/onc notes reviewed today along with results /path Had excision and will need to return for wider excision  No invasion noted but margins were not clear She is handling this well/ not much pain but is anxious Next surgery is planned for march

## 2023-01-15 NOTE — Progress Notes (Signed)
Subjective:    Patient ID: [name] [name], female    DOB: Dec 24, 1955, 67 y.o.   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  Had a partial vulvectomy on 1/16 No invasion noted on path but had positive margins so she is planning a wide excision  This is scheduled early in march    No family history of melanoma   Her bp was up to 146/70 at the gyn/onc office   BP Readings from Last 3 Encounters:  01/07/23 (!) 146/70  12/31/22 138/61  12/25/22 (!) 162/94   Per pt 179/86 at the cancer center before r echeck 179/90 at surgery   Cannot get her wrist cuff working  At home is 115/70 Down to 105 at home   Is feeling down  Also anxious   She recently increased lamictal by 50 mg  Tolerates well  Does help   She developed a tick- mouth / at night  On austedo for that    Has family h/o HTN   Diet has been fair  Avoiding fried foods  Waffle with pb for breakfast Chicken salad  Not as much veg Eats fruit   Has not been able to exercise yet since surgery  Will be cleared on Tuesday=  then surgery in march again   Patient Active Problem List   Diagnosis Date Noted   Elevated blood pressure reading 01/15/2023   Melanoma in situ ([place]) 12/25/2022   ASCUS of cervix with negative high risk HPV 06/25/2022   Yeast vaginitis 06/25/2022   Osteoarthritis of knee 05/25/2022   Hemorrhoid 04/25/2022   Positive self-administered antigen test for COVID-19 02/19/2022   Estrogen deficiency 01/02/2022   Vaginal dryness, menopausal 05/22/2021   Bipolar disorder, in partial remission, most recent episode mixed ([place]) 02/25/2020   Elevated glucose level 03/03/2018    Low back pain 12/23/2017   Atrophic vaginitis 07/08/2017   Cervical stenosis (uterine cervix) 01/03/2017   Hearing loss 10/25/2015   Routine general medical examination at a health care facility 10/17/2015   Encounter for routine gynecological examination 10/22/2014   Elevated serum creatinine 10/22/2014   Colon cancer screening 10/16/2013   Encounter for Medicare annual wellness exam 10/04/2011   METATARSALGIA 12/01/2009   Enthesopathy of ankle and tarsus 12/01/2009   POSTMENOPAUSAL STATUS 09/30/2009   ARTHRITIS, RIGHT HIP 03/10/2009   DEGENERATIVE DISC DISEASE, LUMBAR SPINE 03/10/2009   DDD (degenerative disc disease), lumbosacral 03/10/2009   ONYCHOMYCOSIS 09/17/2008   Hypothyroidism 08/05/2007   Morbid obesity ([place]) 08/05/2007   Bipolar disorder ([place]) 08/05/2007   ALLERGIC RHINITIS 08/05/2007   IBS 08/05/2007   GERD (gastroesophageal reflux disease) 08/05/2007   HYPERCHOLESTEROLEMIA, PURE 07/07/2007   Past Medical History:  Diagnosis Date   Actinic keratosis 03/29/2021   R central upper lip   Allergic rhinitis    Atypical mole 12/26/2021   spinal upper back, exc 01/15/22   Basal cell carcinoma 10/16/2010   R lower pretibia    Bipolar 1 disorder ([facility])    Fissure, anal    GERD (gastroesophageal reflux disease)  History of basal cell carcinoma excision    ON FACE   History of squamous cell carcinoma excision    ON RIGHT LEG   Hyperlipidemia    Hypothyroidism    IBS (irritable bowel syndrome)    Liver cyst    Malignant melanoma in situ ([place])    Mallet finger of left hand    small finger   Past Surgical History:  Procedure Laterality Date   CARDIAC CATHETERIZATION  04/10/1999   per pt normal   CARDIOVASCULAR STRESS TEST  02-23-2011  dr Chrissie Noa fath [facility])   normal nuclear study/  no ischemia/  ef 64%   CATARACT EXTRACTION W/ INTRAOCULAR LENS  IMPLANT, BILATERAL  12/10/2001   Bil   CESAREAN SECTION  12/10/1982   CLOSED REDUCTION FINGER WITH  PERCUTANEOUS PINNING Left 04/22/2014   Procedure: LEFT SMALL FINGER CLOSED REDUCTION PINNING;  Surgeon: Linna Hoff, MD;  Location: [facility];  Service: Orthopedics;  Laterality: Left;   COLONOSCOPY  11/09/2002   DILATION AND CURETTAGE OF UTERUS  1977   LAPAROSCOPIC CHOLECYSTECTOMY  03/18/2000   RECONSTRUCTION OF EYELID Bilateral 08/2022   TRANSTHORACIC ECHOCARDIOGRAM  02/23/2011   normal lvf/  ef 58%/  mild rve/   mild lae/  mild  mr  & tr/  mild pulmonary htn   VULVECTOMY N/A 12/25/2022   Procedure: PARTIAL  VULVECTOMY;  Surgeon: Bernadene Bell, MD;  Location: [facility];  Service: Gynecology;  Laterality: N/A;   WISDOM TOOTH EXTRACTION     Social History   Tobacco Use   Smoking status: Never   Smokeless tobacco: Never  Vaping Use   Vaping Use: Never used  Substance Use Topics   Alcohol use: Yes    Alcohol/week: 0.0 standard drinks of alcohol    Comment: wine- rarely   Drug use: No   Family History  Problem Relation Age of Onset   Stroke Mother    Heart disease Mother    Hypertension Father    Diabetes Father    Hyperlipidemia Father    Diabetes Brother    Colon cancer Neg Hx    Breast cancer Neg Hx    Ovarian cancer Neg Hx    Endometrial cancer Neg Hx    Pancreatic cancer Neg Hx    Prostate cancer Neg Hx    Allergies  Allergen Reactions   Ingrezza [Valbenazine Tosylate] Other (See Comments)    Night sweats, worsened tardive dyskinesia, nightmares   Lidocaine Swelling    REACTION: swelling ALL CAINE DRUGS EXCEPT: marcaine & sensercaine Other reaction(s): Edema   Xylocaine [Lidocaine Hcl] Swelling    ALLERGIC TO ALL CAINES EXCEPT SENSORCAINE , AND MARCAINE   Amantadine    Codeine Nausea And Vomiting    REACTION: vomits blood   Lithium Swelling   Tegretol [Carbamazepine] Other (See Comments)    REACTION: skin crawls   Current Outpatient Medications on File Prior to Visit  Medication Sig Dispense Refill   aspirin EC 81 MG  tablet Take 81 mg by mouth daily. Swallow whole.     BIOTIN PO Take 1 tablet by mouth daily.     buPROPion (WELLBUTRIN XL) 150 MG 24 hr tablet Take 300 mg by mouth daily. Take three tablets by mouth every morning     cetirizine (ZYRTEC ALLERGY) 10 MG tablet Take 10 mg by mouth daily as needed for allergies.     Cholecalciferol (VITAMIN D) 2000 UNITS CAPS Take 1 capsule by mouth daily.  clonazePAM (KLONOPIN) 1 MG tablet Take 1 mg by mouth at bedtime.     Deutetrabenazine (AUSTEDO XR PATIENT TITRATION) 6 & 12 & 24 MG TEPK Take by mouth.     famotidine (PEPCID) 20 MG tablet Take 1 tablet (20 mg total) by mouth 2 (two) times daily. 180 tablet 1   hydrocortisone (ANUSOL-HC) 2.5 % rectal cream Apply 1 application. topically 2 (two) times daily. To affected area twice daily (around anus/ hemorrhoid) 30 g 0   lamoTRIgine (LAMICTAL) 100 MG tablet Take 150 mg by mouth daily.     levothyroxine (SYNTHROID) 50 MCG tablet TAKE 1 TABLET EVERY DAY BEFORE BREAKFAST 90 tablet 1   lovastatin (MEVACOR) 40 MG tablet TAKE 1 TABLET EVERY DAY 90 tablet 1   metroNIDAZOLE (METROGEL) 1 % gel Apply topically daily. To nose, cheeks for rosacea 45 g 2   nystatin (MYCOSTATIN/NYSTOP) powder Apply 1 Application topically 3 (three) times daily. Under abdomen skin fold 15 g 2   Probiotic Product (PROBIOTIC ADVANCED PO) Take by mouth.     senna-docusate (SENOKOT-S) 8.6-50 MG tablet Take 2 tablets by mouth at bedtime. For AFTER surgery, do not take if having diarrhea 30 tablet 0   tiZANidine (ZANAFLEX) 4 MG tablet Take 1 tablet by mouth at bedtime as needed.  0   traMADol (ULTRAM) 50 MG tablet Take 1 tablet (50 mg total) by mouth every 6 (six) hours as needed for severe pain. For AFTER surgery only, do not take and drive 10 tablet 0   zolpidem (AMBIEN) 10 MG tablet Take 10 mg by mouth at bedtime.     No current facility-administered medications on file prior to visit.     Review of Systems  Constitutional:  Negative for  activity change, appetite change, fatigue, fever and unexpected weight change.  HENT:  Negative for congestion, ear pain, rhinorrhea, sinus pressure and sore throat.   Eyes:  Negative for pain, redness and visual disturbance.  Respiratory:  Negative for cough, shortness of breath and wheezing.   Cardiovascular:  Negative for chest pain and palpitations.  Gastrointestinal:  Negative for abdominal pain, blood in stool, constipation and diarrhea.  Endocrine: Negative for polydipsia and polyuria.  Genitourinary:  Negative for dysuria, frequency and urgency.  Musculoskeletal:  Negative for arthralgias, back pain and myalgias.  Skin:  Negative for pallor and rash.  Allergic/Immunologic: Negative for environmental allergies.  Neurological:  Negative for dizziness, syncope and headaches.  Hematological:  Negative for adenopathy. Does not bruise/bleed easily.  Psychiatric/Behavioral:  Positive for dysphoric mood. Negative for decreased concentration, self-injury and suicidal ideas. The patient is nervous/anxious.        Objective:   Physical Exam Constitutional:      General: She is not in acute distress.    Appearance: Normal appearance. She is well-developed. She is not ill-appearing.  HENT:     Head: Normocephalic and atraumatic.     Mouth/Throat:     Mouth: Mucous membranes are moist.  Eyes:     General: No scleral icterus.    Conjunctiva/sclera: Conjunctivae normal.     Pupils: Pupils are equal, round, and reactive to light.  Neck:     Thyroid: No thyromegaly.     Vascular: No carotid bruit or JVD.  Cardiovascular:     Rate and Rhythm: Normal rate and regular rhythm.     Heart sounds: Normal heart sounds.     No gallop.  Pulmonary:     Effort: Pulmonary effort is normal. No respiratory distress.  Breath sounds: Normal breath sounds. No stridor. No wheezing, rhonchi or rales.  Abdominal:     General: There is no distension or abdominal bruit.     Palpations: Abdomen is soft.      Tenderness: There is no abdominal tenderness.  Musculoskeletal:     Cervical back: Normal range of motion and neck supple.     Right lower leg: No edema.     Left lower leg: No edema.     Comments: Puffy ankles (baseline) but no pitting edema  Lymphadenopathy:     Cervical: No cervical adenopathy.  Skin:    General: Skin is warm and dry.     Coloration: Skin is not pale.     Findings: No rash.  Neurological:     Mental Status: She is alert.     Coordination: Coordination normal.     Deep Tendon Reflexes: Reflexes are normal and symmetric. Reflexes normal.  Psychiatric:        Attention and Perception: Attention normal.        Mood and Affect: Mood is anxious and depressed.        Speech: Speech normal.        Behavior: Behavior normal.        Cognition and Memory: Cognition and memory normal.     Comments: Pleasant Candidly discusses symptoms and stressors             Assessment & Plan:   Problem List Items Addressed This Visit       Other   Bipolar disorder ([place])    Worsened anxiety lately in setting of melanoma in situ and surgeries  Per pt psychiatry went up on her lamictal and it helps  Also taking deutetrabenazine for a tic (mouth)         Elevated blood pressure reading - Primary    Pt has had some elevated bp readings in the setting of gyn and onc f/u of melanoma (at surgery and follow up) Onc/gyn notes reviewed At home with her wrist cuff (not working today) much lower in the one teens /70s Here after sitting/when relaxed 122/70  Very reassuring  Anxiety has been high as expected  Disc goals of less processed foods and wt loss  Exercise when cleared to do so  Handouts given for DASH eating and how to take bp She may consider change to arm cuff (would need size large) Will continue to follow this She is at risk due to obesity and family history      Melanoma in situ ([place])    Vulvar Followed by gyn and also oncology Gyn/onc notes reviewed  today along with results /path Had excision and will need to return for wider excision  No invasion noted but margins were not clear She is handling this well/ not much pain but is anxious Next surgery is planned for march

## 2023-01-31 ENCOUNTER — Encounter (HOSPITAL_BASED_OUTPATIENT_CLINIC_OR_DEPARTMENT_OTHER): Payer: Self-pay | Admitting: Psychiatry

## 2023-01-31 NOTE — Progress Notes (Signed)
Spoke w/ via phone for pre-op interview--- [name] [name] Lab needs dos----  NONE             Lab results------ COVID test -----patient states asymptomatic no test needed Arrive at -------1115 NPO after MN NO Solid Food.  Clear liquids from MN until---1015 Med rec completed Medications to take morning of surgery -----Wellbutrin, Pepcid, Lamictal and Levothyroxine Diabetic medication ----- Patient instructed no nail polish to be worn day of surgery Patient instructed to bring photo id and insurance card day of surgery Patient aware to have Driver (ride ) / caregiver [name]   for 24 hours after surgery  Patient Special Instructions ----- Pre-Op special Istructions ----- Patient verbalized understanding of instructions that were given at this phone interview. Patient denies shortness of breath, chest pain, fever, cough at this phone interview.

## 2023-02-04 ENCOUNTER — Inpatient Hospital Stay: Payer: Medicare HMO | Attending: Gynecologic Oncology | Admitting: Psychiatry

## 2023-02-04 ENCOUNTER — Encounter: Payer: Self-pay | Admitting: Psychiatry

## 2023-02-04 ENCOUNTER — Inpatient Hospital Stay: Payer: Medicare HMO

## 2023-02-04 VITALS — BP 138/84 | HR 70 | Temp 97.8°F | Resp 20 | Wt 218.0 lb

## 2023-02-04 DIAGNOSIS — C519 Malignant neoplasm of vulva, unspecified: Secondary | ICD-10-CM

## 2023-02-04 DIAGNOSIS — D038 Melanoma in situ of other sites: Secondary | ICD-10-CM

## 2023-02-04 DIAGNOSIS — Z9079 Acquired absence of other genital organ(s): Secondary | ICD-10-CM

## 2023-02-04 MED ORDER — TRAMADOL HCL 50 MG PO TABS: 50.0000 mg | ORAL_TABLET | Freq: Four times a day (QID) | ORAL | 0 refills | Status: DC | PRN

## 2023-02-04 MED ORDER — SENNOSIDES-DOCUSATE SODIUM 8.6-50 MG PO TABS: 2.0000 | ORAL_TABLET | Freq: Every day | ORAL | 0 refills | Status: DC

## 2023-02-04 NOTE — Progress Notes (Signed)
Gynecologic Oncology Return Clinic Visit  Date of Service: 02/04/2023 Referring Provider: Clovia Cuff, MD   Assessment & Plan: [name] [name] is a 67 y.o. woman with vulvar melanoma in situ who is s/p simple partial vulvectomy on 12/25/22, who presents for repeat exam and preoperative discussion ahead of re-excision.  Melanoma in situ - Well healed from prior procedure and okay to proceed with re-excision. - Patient was consented for: simple partial vulvectomy and removal of clitoris on 02/12/23. - The risks of surgery were discussed in detail and she understands these to including but not limited to bleeding requiring a blood transfusion, infection, injury to adjacent organs (including but not limited to the the urethra, surrounding blood vessels and nerves), wound separation, unforseen complication, and possible need for re-exploration. - If the patient experiences any of these events, she understands that her hospitalization or recovery may be prolonged and that she may need to take additional medications for a prolonged period. The patient will receive DVT and antibiotic prophylaxis as indicated. She voiced a clear understanding. She had the opportunity to ask questions and informed consent was obtained today. She wishes to proceed.  She does not require preoperative clearance. Her METs are >4.  All preoperative instructions were reviewed. Postoperative expectations were also reviewed.  RTC 2 weeks postop.  Bernadene Bell, MD Gynecologic Oncology   Medical Decision Making I personally spent  TOTAL 20 minutes face-to-face and non-face-to-face in the care of this patient, which includes all pre, intra, and post visit time on the date of service.   ----------------------- Reason for Visit: Preoperative visit   Interval History: Pt presents today with her husband.  She reports that she had a good trip to see her grandchildren.  She feels that she has overall healed well with just 1 spot  that still little tender.  She notes a rash that started under her right abdominal wall fold yesterday for which she started nystatin powder.   Past Medical/Surgical History: Past Medical History:  Diagnosis Date   Actinic keratosis 03/29/2021   R central upper lip   Allergic rhinitis    Atypical mole 12/26/2021   spinal upper back, exc 01/15/22   Basal cell carcinoma 10/16/2010   R lower pretibia    Bipolar 1 disorder ([facility])    Fissure, anal    GERD (gastroesophageal reflux disease)    History of basal cell carcinoma excision    ON FACE   History of squamous cell carcinoma excision    ON RIGHT LEG   Hyperlipidemia    Hypothyroidism    IBS (irritable bowel syndrome)    Liver cyst    Malignant melanoma in situ ([place])    Mallet finger of left hand    small finger   Pre-diabetes     Past Surgical History:  Procedure Laterality Date   CARDIAC CATHETERIZATION  04/10/1999   per pt normal   CARDIOVASCULAR STRESS TEST  02-23-2011  dr Chrissie Noa fath [facility] clinic)   normal nuclear study/  no ischemia/  ef 64%   CATARACT EXTRACTION W/ INTRAOCULAR LENS  IMPLANT, BILATERAL  12/10/2001   Bil   CESAREAN SECTION  12/10/1982   CLOSED REDUCTION FINGER WITH PERCUTANEOUS PINNING Left 04/22/2014   Procedure: LEFT SMALL FINGER CLOSED REDUCTION PINNING;  Surgeon: Linna Hoff, MD;  Location: [facility];  Service: Orthopedics;  Laterality: Left;   COLONOSCOPY  11/09/2002   DILATION AND CURETTAGE OF UTERUS  1977   LAPAROSCOPIC CHOLECYSTECTOMY  03/18/2000  RECONSTRUCTION OF EYELID Bilateral 08/2022   TRANSTHORACIC ECHOCARDIOGRAM  02/23/2011   normal lvf/  ef 58%/  mild rve/   mild lae/  mild  mr  & tr/  mild pulmonary htn   VULVECTOMY N/A 12/25/2022   Procedure: PARTIAL  VULVECTOMY;  Surgeon: Bernadene Bell, MD;  Location: [facility];  Service: Gynecology;  Laterality: N/A;   WISDOM TOOTH EXTRACTION      Family History  Problem Relation Age of Onset    Stroke Mother    Heart disease Mother    Hypertension Father    Diabetes Father    Hyperlipidemia Father    Diabetes Brother    Colon cancer Neg Hx    Breast cancer Neg Hx    Ovarian cancer Neg Hx    Endometrial cancer Neg Hx    Pancreatic cancer Neg Hx    Prostate cancer Neg Hx     Social History   Socioeconomic History   Marital status: Married    Spouse name: Not on file   Number of children: 1   Years of education: Not on file   Highest education level: Not on file  Occupational History   Occupation: Home    Employer: RETIRED  Tobacco Use   Smoking status: Never   Smokeless tobacco: Never  Vaping Use   Vaping Use: Never used  Substance and Sexual Activity   Alcohol use: Yes    Alcohol/week: 0.0 standard drinks of alcohol    Comment: wine- rarely   Drug use: No   Sexual activity: Not Currently    Birth control/protection: Post-menopausal  Other Topics Concern   Not on file  Social History Narrative   Moved here from [place]   Social Determinants of Health   Financial Resource Strain: Low Risk  (05/25/2022)   Overall Financial Resource Strain (CARDIA)    Difficulty of Paying Living Expenses: Not hard at all  Food Insecurity: No Food Insecurity (05/25/2022)   Hunger Vital Sign    Worried About Running Out of Food in the Last Year: Never true    Ran Out of Food in the Last Year: Never true  Transportation Needs: No Transportation Needs (05/25/2022)   PRAPARE - Hydrologist (Medical): No    Lack of Transportation (Non-Medical): No  Physical Activity: Sufficiently Active (05/25/2022)   Exercise Vital Sign    Days of Exercise per Week: 4 days    Minutes of Exercise per Session: 60 min  Stress: No Stress Concern Present (05/25/2022)   [place]    Feeling of Stress : Not at all  Social Connections: Moderately Integrated (05/25/2022)   Social Connection and Isolation Panel  [NHANES]    Frequency of Communication with Friends and Family: More than three times a week    Frequency of Social Gatherings with Friends and Family: Once a week    Attends Religious Services: More than 4 times per year    Active Member of [employer] or Organizations: No    Attends Archivist Meetings: Never    Marital Status: Married    Current Medications:  Current Outpatient Medications:    aspirin EC 81 MG tablet, Take 81 mg by mouth daily. Swallow whole., Disp: , Rfl:    BIOTIN PO, Take 1 tablet by mouth daily., Disp: , Rfl:    buPROPion (WELLBUTRIN XL) 150 MG 24 hr tablet, Take 300 mg by mouth daily. Take three tablets  by mouth every morning, Disp: , Rfl:    cetirizine (ZYRTEC ALLERGY) 10 MG tablet, Take 10 mg by mouth daily as needed for allergies., Disp: , Rfl:    Cholecalciferol (VITAMIN D) 2000 UNITS CAPS, Take 1 capsule by mouth daily., Disp: , Rfl:    clonazePAM (KLONOPIN) 1 MG tablet, Take 1 mg by mouth at bedtime., Disp: , Rfl:    Deutetrabenazine (AUSTEDO XR PATIENT TITRATION) 6 & 12 & 24 MG TEPK, Take by mouth., Disp: , Rfl:    famotidine (PEPCID) 20 MG tablet, Take 1 tablet (20 mg total) by mouth 2 (two) times daily., Disp: 180 tablet, Rfl: 1   hydrocortisone (ANUSOL-HC) 2.5 % rectal cream, Apply 1 application. topically 2 (two) times daily. To affected area twice daily (around anus/ hemorrhoid), Disp: 30 g, Rfl: 0   lamoTRIgine (LAMICTAL) 100 MG tablet, Take 150 mg by mouth daily., Disp: , Rfl:    levothyroxine (SYNTHROID) 50 MCG tablet, TAKE 1 TABLET EVERY DAY BEFORE BREAKFAST, Disp: 90 tablet, Rfl: 1   lovastatin (MEVACOR) 40 MG tablet, TAKE 1 TABLET EVERY DAY, Disp: 90 tablet, Rfl: 1   metroNIDAZOLE (METROGEL) 1 % gel, Apply topically daily. To nose, cheeks for rosacea, Disp: 45 g, Rfl: 2   nystatin (MYCOSTATIN/NYSTOP) powder, Apply 1 Application topically 3 (three) times daily. Under abdomen skin fold, Disp: 15 g, Rfl: 2   Probiotic Product (PROBIOTIC ADVANCED  PO), Take by mouth., Disp: , Rfl:    senna-docusate (SENOKOT-S) 8.6-50 MG tablet, Take 2 tablets by mouth at bedtime. For AFTER surgery, do not take if having diarrhea, Disp: 30 tablet, Rfl: 0   tiZANidine (ZANAFLEX) 4 MG tablet, Take 1 tablet by mouth at bedtime as needed., Disp: , Rfl: 0   traMADol (ULTRAM) 50 MG tablet, Take 1 tablet (50 mg total) by mouth every 6 (six) hours as needed for severe pain. For AFTER surgery only, do not take and drive, Disp: 10 tablet, Rfl: 0   zolpidem (AMBIEN) 10 MG tablet, Take 10 mg by mouth at bedtime., Disp: , Rfl:   Review of Symptoms: Complete 10-system review is positive for: Rash, joint pain, hearing loss, anxiety, back pain, depression  Physical Exam: BP 138/84 (BP Location: Right Arm, Patient Position: Sitting)   Pulse 70   Temp 97.8 F (36.6 C) (Oral)   Resp 20   Wt 218 lb (98.9 kg)   BMI 41.87 kg/m  General: Alert, oriented, no acute distress. HEENT: Normocephalic, atraumatic. Neck symmetric without masses. Sclera anicteric.  Chest: Normal work of breathing. Cardiovascular: Regular rate  GU: Well-healing external genitalia with asymmetry of labia minora, smaller right labia from prior resection.  No overt melanotic lesions apparent. Exam chaperoned by Kimberly Martinique, [place]   Laboratory & Radiologic Studies: Surgical pathology (12/25/22): A. ANTERIOR VULVA, EXCISION: MELANOMA IN SITU, EXTENDING TO THE DEEP  SURGICAL MARGIN. THE PERIPHERAL MARGINS ARE NEGATIVE.  B. ANTERIOR VULVA, LEFT OF CLITORAL HOOD, EXCISION:  MELANOMA IN SITU  INVOLVING THE 12, 3, 6 AND 9 O'CLOCK MARGINS.

## 2023-02-04 NOTE — Patient Instructions (Addendum)
Preparing for your Surgery   Plan for surgery on February 12, 2023 with Dr. Bernadene Bell at [facility]. You will be scheduled for pelvic examination under anesthesia, partial vulvectomy, removal of the clitoris.    Pre-operative Testing -You will receive a phone call from presurgical testing at [facility] to discuss surgery instructions and arrange for lab work if needed.   -Bring your insurance card, copy of an advanced directive if applicable, medication list.   -Your last dose of aspirin 81 mg should be the day before surgery. Do not take this the morning of surgery.   -Do not take supplements such as fish oil (omega 3), red yeast rice, turmeric before your surgery. You want to avoid medications with aspirin in them including headache powders such as BC or Goody's), Excedrin migraine.   Day Before Surgery at [facility] will be advised you can have clear liquids up until 3 hours before your surgery.     Your role in recovery Your role is to become active as soon as directed by your doctor, while still giving yourself time to heal.  Rest when you feel tired. You will be asked to do the following in order to speed your recovery:   - Cough and breathe deeply. This helps to clear and expand your lungs and can prevent pneumonia after surgery.  - [place]. Do mild physical activity. Walking or moving your legs help your circulation and body functions return to normal. Do not try to get up or walk alone the first time after surgery.   -If you develop swelling on one leg or the other, pain in the back of your leg, redness/warmth in one of your legs, please call the office or go to the Emergency Room to have a doppler to rule out a blood clot. For shortness of breath, chest pain-seek care in the Emergency Room as soon as possible. - Actively manage your pain. Managing your pain lets you move in comfort. We will ask you to rate your pain on  a scale of zero to 10. It is your responsibility to tell your doctor or nurse where and how much you hurt so your pain can be treated.   Special Considerations -Your final pathology results from surgery should be available around one week after surgery and the results will be relayed to you when available.   -FMLA forms can be faxed to [phone] and please allow 5-7 business days for completion.   Pain Management After Surgery -You will be prescribed your pain medication and bowel regimen medications before surgery so that you can have these available when you are discharged from the hospital. The pain medication is for use ONLY AFTER surgery and a new prescription will not be given.    -Make sure that you have Tylenol at home IF [place] to use on a regular basis after surgery for pain control.    -Review the attached handout on narcotic use and their risks and side effects.    Bowel Regimen -You will be prescribed Sennakot-S to take nightly to prevent constipation especially if you are taking the narcotic pain medication intermittently.  It is important to prevent constipation and drink adequate amounts of liquids. You can stop taking this medication when you are not taking pain medication and you are back on your normal bowel routine.   Risks of Surgery Risks of surgery are low but  include bleeding, infection, damage to surrounding structures, re-operation, blood clots, and very rarely death.   AFTER SURGERY INSTRUCTIONS   Return to work:  variable based on occupation   We recommend purchasing several bags of frozen green peas and dividing them into ziploc bags. You will want to keep these in the freezer and have them ready to use as ice packs to the vulvar incision. Once the ice pack is no longer cold, you can get another from the freezer. The frozen peas mold to your body better than a regular ice pack.      Activity: 1. Be up and out of the bed during  the day.  Take a nap if needed.  You may walk up steps but be careful and use the hand rail.  Stair climbing will tire you more than you think, you may need to stop part way and rest.    2. No lifting or straining for 4 weeks over 10 pounds. No pushing, pulling, straining for 4 weeks.   3. No driving for minimum 24 hours after surgery but this is usually longer since you need to be able to brake safely and be off pain meds.  Do not drive if you are taking narcotic pain medicine and make sure that your reaction time has returned.    4. You can shower as soon as the next day after surgery. Shower daily. No tub baths or submerging your body in water until cleared by your surgeon. If you have the soap that was given to you by pre-surgical testing that was used before surgery, you do not need to use it afterwards because this can irritate your incisions.    5. No sexual activity and nothing in the vagina for 4 weeks.   6. You may experience vaginal spotting and discharge after surgery.  The spotting is normal but if you experience heavy bleeding, call our office.   7. Take Tylenol first for pain if you are able to take these medication and only use narcotic pain medication for severe pain not relieved by the Tylenol.  Monitor your Tylenol intake to a max of 4,000 mg in a 24 hour period.    Diet: 1. Low sodium Heart Healthy Diet is recommended but you are cleared to resume your normal (before surgery) diet after your procedure.   2. It is safe to use a laxative, such as Miralax or Colace, if you have difficulty moving your bowels. You have been prescribed Sennakot at bedtime every evening to keep bowel movements regular and to prevent constipation.     Wound Care: 1. Keep clean and dry.  Shower daily.   Reasons to call the Doctor: Fever - Oral temperature greater than 100.4 degrees Fahrenheit Foul-smelling vaginal discharge Difficulty urinating Nausea and vomiting Increased pain at the site of  the incision that is unrelieved with pain medicine. Difficulty breathing with or without chest pain New calf pain especially if only on one side Sudden, continuing increased vaginal bleeding with or without clots.   Contacts: For questions or concerns you should contact:   Dr. Bernadene Bell at [place], NP at [phone]   After Hours: call [phone] and have the GYN Oncologist paged/contacted (after 5 pm or on the weekends).   Messages sent via mychart are for non-urgent matters and are not responded to after hours so for urgent needs, please call the after hours number.

## 2023-02-04 NOTE — H&P (View-Only) (Signed)
Gynecologic Oncology Return Clinic Visit  Date of Service: 02/04/2023 Referring Provider: Clovia Cuff, MD   Assessment & Plan: [name] [name] is a 67 y.o. woman with vulvar melanoma in situ who is s/p simple partial vulvectomy on 12/25/22, who presents for repeat exam and preoperative discussion ahead of re-excision.  Melanoma in situ - Well healed from prior procedure and okay to proceed with re-excision. - Patient was consented for: simple partial vulvectomy and removal of clitoris on 02/12/23. - The risks of surgery were discussed in detail and she understands these to including but not limited to bleeding requiring a blood transfusion, infection, injury to adjacent organs (including but not limited to the the urethra, surrounding blood vessels and nerves), wound separation, unforseen complication, and possible need for re-exploration. - If the patient experiences any of these events, she understands that her hospitalization or recovery may be prolonged and that she may need to take additional medications for a prolonged period. The patient will receive DVT and antibiotic prophylaxis as indicated. She voiced a clear understanding. She had the opportunity to ask questions and informed consent was obtained today. She wishes to proceed.  She does not require preoperative clearance. Her METs are >4.  All preoperative instructions were reviewed. Postoperative expectations were also reviewed.  RTC 2 weeks postop.  Bernadene Bell, MD Gynecologic Oncology   Medical Decision Making I personally spent  TOTAL 20 minutes face-to-face and non-face-to-face in the care of this patient, which includes all pre, intra, and post visit time on the date of service.   ----------------------- Reason for Visit: Preoperative visit   Interval History: Pt presents today with her husband.  She reports that she had a good trip to see her grandchildren.  She feels that she has overall healed well with just 1 spot  that still little tender.  She notes a rash that started under her right abdominal wall fold yesterday for which she started nystatin powder.   Past Medical/Surgical History: Past Medical History:  Diagnosis Date   Actinic keratosis 03/29/2021   R central upper lip   Allergic rhinitis    Atypical mole 12/26/2021   spinal upper back, exc 01/15/22   Basal cell carcinoma 10/16/2010   R lower pretibia    Bipolar 1 disorder (HCC)    Fissure, anal    GERD (gastroesophageal reflux disease)    History of basal cell carcinoma excision    ON FACE   History of squamous cell carcinoma excision    ON RIGHT LEG   Hyperlipidemia    Hypothyroidism    IBS (irritable bowel syndrome)    Liver cyst    Malignant melanoma in situ ([place])    Mallet finger of left hand    small finger   Pre-diabetes     Past Surgical History:  Procedure Laterality Date   CARDIAC CATHETERIZATION  04/10/1999   per pt normal   CARDIOVASCULAR STRESS TEST  02-23-2011  dr Chrissie Noa fath [facility] clinic)   normal nuclear study/  no ischemia/  ef 64%   CATARACT EXTRACTION W/ INTRAOCULAR LENS  IMPLANT, BILATERAL  12/10/2001   Bil   CESAREAN SECTION  12/10/1982   CLOSED REDUCTION FINGER WITH PERCUTANEOUS PINNING Left 04/22/2014   Procedure: LEFT SMALL FINGER CLOSED REDUCTION PINNING;  Surgeon: Linna Hoff, MD;  Location: [place];  Service: Orthopedics;  Laterality: Left;   COLONOSCOPY  11/09/2002   DILATION AND CURETTAGE OF UTERUS  1977   LAPAROSCOPIC CHOLECYSTECTOMY  03/18/2000  RECONSTRUCTION OF EYELID Bilateral 08/2022   TRANSTHORACIC ECHOCARDIOGRAM  02/23/2011   normal lvf/  ef 58%/  mild rve/   mild lae/  mild  mr  & tr/  mild pulmonary htn   VULVECTOMY N/A 12/25/2022   Procedure: PARTIAL  VULVECTOMY;  Surgeon: Bernadene Bell, MD;  Location: [place];  Service: Gynecology;  Laterality: N/A;   WISDOM TOOTH EXTRACTION      Family History  Problem Relation Age of Onset    Stroke Mother    Heart disease Mother    Hypertension Father    Diabetes Father    Hyperlipidemia Father    Diabetes Brother    Colon cancer Neg Hx    Breast cancer Neg Hx    Ovarian cancer Neg Hx    Endometrial cancer Neg Hx    Pancreatic cancer Neg Hx    Prostate cancer Neg Hx     Social History   Socioeconomic History   Marital status: Married    Spouse name: Not on file   Number of children: 1   Years of education: Not on file   Highest education level: Not on file  Occupational History   Occupation: Home    Employer: RETIRED  Tobacco Use   Smoking status: Never   Smokeless tobacco: Never  Vaping Use   Vaping Use: Never used  Substance and Sexual Activity   Alcohol use: Yes    Alcohol/week: 0.0 standard drinks of alcohol    Comment: wine- rarely   Drug use: No   Sexual activity: Not Currently    Birth control/protection: Post-menopausal  Other Topics Concern   Not on file  Social History Narrative   Moved here from [place]   Social Determinants of Health   Financial Resource Strain: Low Risk  (05/25/2022)   Overall Financial Resource Strain (CARDIA)    Difficulty of Paying Living Expenses: Not hard at all  Food Insecurity: No Food Insecurity (05/25/2022)   Hunger Vital Sign    Worried About Running Out of Food in the Last Year: Never true    Ran Out of Food in the Last Year: Never true  Transportation Needs: No Transportation Needs (05/25/2022)   PRAPARE - Hydrologist (Medical): No    Lack of Transportation (Non-Medical): No  Physical Activity: Sufficiently Active (05/25/2022)   Exercise Vital Sign    Days of Exercise per Week: 4 days    Minutes of Exercise per Session: 60 min  Stress: No Stress Concern Present (05/25/2022)   [place]    Feeling of Stress : Not at all  Social Connections: Moderately Integrated (05/25/2022)   Social Connection and Isolation Panel  [NHANES]    Frequency of Communication with Friends and Family: More than three times a week    Frequency of Social Gatherings with Friends and Family: Once a week    Attends Religious Services: More than 4 times per year    Active Member of [employer] or Organizations: No    Attends Archivist Meetings: Never    Marital Status: Married    Current Medications:  Current Outpatient Medications:    aspirin EC 81 MG tablet, Take 81 mg by mouth daily. Swallow whole., Disp: , Rfl:    BIOTIN PO, Take 1 tablet by mouth daily., Disp: , Rfl:    buPROPion (WELLBUTRIN XL) 150 MG 24 hr tablet, Take 300 mg by mouth daily. Take three tablets  by mouth every morning, Disp: , Rfl:    cetirizine (ZYRTEC ALLERGY) 10 MG tablet, Take 10 mg by mouth daily as needed for allergies., Disp: , Rfl:    Cholecalciferol (VITAMIN D) 2000 UNITS CAPS, Take 1 capsule by mouth daily., Disp: , Rfl:    clonazePAM (KLONOPIN) 1 MG tablet, Take 1 mg by mouth at bedtime., Disp: , Rfl:    Deutetrabenazine (AUSTEDO XR PATIENT TITRATION) 6 & 12 & 24 MG TEPK, Take by mouth., Disp: , Rfl:    famotidine (PEPCID) 20 MG tablet, Take 1 tablet (20 mg total) by mouth 2 (two) times daily., Disp: 180 tablet, Rfl: 1   hydrocortisone (ANUSOL-HC) 2.5 % rectal cream, Apply 1 application. topically 2 (two) times daily. To affected area twice daily (around anus/ hemorrhoid), Disp: 30 g, Rfl: 0   lamoTRIgine (LAMICTAL) 100 MG tablet, Take 150 mg by mouth daily., Disp: , Rfl:    levothyroxine (SYNTHROID) 50 MCG tablet, TAKE 1 TABLET EVERY DAY BEFORE BREAKFAST, Disp: 90 tablet, Rfl: 1   lovastatin (MEVACOR) 40 MG tablet, TAKE 1 TABLET EVERY DAY, Disp: 90 tablet, Rfl: 1   metroNIDAZOLE (METROGEL) 1 % gel, Apply topically daily. To nose, cheeks for rosacea, Disp: 45 g, Rfl: 2   nystatin (MYCOSTATIN/NYSTOP) powder, Apply 1 Application topically 3 (three) times daily. Under abdomen skin fold, Disp: 15 g, Rfl: 2   Probiotic Product (PROBIOTIC ADVANCED  PO), Take by mouth., Disp: , Rfl:    senna-docusate (SENOKOT-S) 8.6-50 MG tablet, Take 2 tablets by mouth at bedtime. For AFTER surgery, do not take if having diarrhea, Disp: 30 tablet, Rfl: 0   tiZANidine (ZANAFLEX) 4 MG tablet, Take 1 tablet by mouth at bedtime as needed., Disp: , Rfl: 0   traMADol (ULTRAM) 50 MG tablet, Take 1 tablet (50 mg total) by mouth every 6 (six) hours as needed for severe pain. For AFTER surgery only, do not take and drive, Disp: 10 tablet, Rfl: 0   zolpidem (AMBIEN) 10 MG tablet, Take 10 mg by mouth at bedtime., Disp: , Rfl:   Review of Symptoms: Complete 10-system review is positive for: Rash, joint pain, hearing loss, anxiety, back pain, depression  Physical Exam: BP 138/84 (BP Location: Right Arm, Patient Position: Sitting)   Pulse 70   Temp 97.8 F (36.6 C) (Oral)   Resp 20   Wt 218 lb (98.9 kg)   BMI 41.87 kg/m  General: Alert, oriented, no acute distress. HEENT: Normocephalic, atraumatic. Neck symmetric without masses. Sclera anicteric.  Chest: Normal work of breathing. Cardiovascular: Regular rate  GU: Well-healing external genitalia with asymmetry of labia minora, smaller right labia from prior resection.  No overt melanotic lesions apparent. Exam chaperoned by Kimberly Martinique, [place]   Laboratory & Radiologic Studies: Surgical pathology (12/25/22): A. ANTERIOR VULVA, EXCISION: MELANOMA IN SITU, EXTENDING TO THE DEEP  SURGICAL MARGIN. THE PERIPHERAL MARGINS ARE NEGATIVE.  B. ANTERIOR VULVA, LEFT OF CLITORAL HOOD, EXCISION:  MELANOMA IN SITU  INVOLVING THE 12, 3, 6 AND 9 O'CLOCK MARGINS.

## 2023-02-06 ENCOUNTER — Ambulatory Visit (INDEPENDENT_AMBULATORY_CARE_PROVIDER_SITE_OTHER): Payer: Medicare HMO | Admitting: Dermatology

## 2023-02-06 ENCOUNTER — Encounter: Payer: Self-pay | Admitting: Dermatology

## 2023-02-06 VITALS — BP 122/68 | HR 70

## 2023-02-06 DIAGNOSIS — L820 Inflamed seborrheic keratosis: Secondary | ICD-10-CM | POA: Diagnosis not present

## 2023-02-06 DIAGNOSIS — Z85828 Personal history of other malignant neoplasm of skin: Secondary | ICD-10-CM

## 2023-02-06 DIAGNOSIS — L814 Other melanin hyperpigmentation: Secondary | ICD-10-CM

## 2023-02-06 DIAGNOSIS — D038 Melanoma in situ of other sites: Secondary | ICD-10-CM

## 2023-02-06 DIAGNOSIS — L304 Erythema intertrigo: Secondary | ICD-10-CM | POA: Diagnosis not present

## 2023-02-06 DIAGNOSIS — Z86018 Personal history of other benign neoplasm: Secondary | ICD-10-CM | POA: Diagnosis not present

## 2023-02-06 DIAGNOSIS — L821 Other seborrheic keratosis: Secondary | ICD-10-CM

## 2023-02-06 DIAGNOSIS — L578 Other skin changes due to chronic exposure to nonionizing radiation: Secondary | ICD-10-CM | POA: Diagnosis not present

## 2023-02-06 DIAGNOSIS — D1801 Hemangioma of skin and subcutaneous tissue: Secondary | ICD-10-CM

## 2023-02-06 DIAGNOSIS — D229 Melanocytic nevi, unspecified: Secondary | ICD-10-CM

## 2023-02-06 DIAGNOSIS — Z1283 Encounter for screening for malignant neoplasm of skin: Secondary | ICD-10-CM

## 2023-02-06 NOTE — Progress Notes (Signed)
Follow-Up Visit   Subjective  [name] [name] [name] is a 67 y.o. female who presents for the following: Annual Exam (HxAKs, HxBCC. Recent Dx of MIS x2 at vulva, scheduled for 2nd surgery on 02/12/2023).  The patient presents for Total-Body Skin Exam (TBSE) for skin cancer screening and mole check.  The patient has spots, moles and lesions [name] [name] be evaluated, some may be new or changing and the patient has concerns that these could be cancer.   The following portions of the chart were reviewed this encounter and updated as appropriate:      Review of Systems: No other skin or systemic complaints except as noted in HPI or Assessment and Plan.   Objective  Well appearing patient in no apparent distress; mood and affect are within normal limits.  A full examination was performed including scalp, head, eyes, ears, nose, lips, neck, chest, axillae, abdomen, back, buttocks, bilateral upper extremities, bilateral lower extremities, hands, feet, fingers, toes, fingernails, and toenails. All findings within normal limits unless otherwise noted below.  vulva Area not examined today. Melanoma IS was previously removed but had positive margin  chest, right lateral vulva at inguinal crease, right hand dorsum, right flank Erythematous keratotic or waxy stuck-on papule or plaque.  Inguinal Areas Erythema with scale, pt uses Nystatin powder which helps  left upper temple 6 mm waxy flesh papule with 1 mm brown center   Assessment & Plan   History of Squamous Cell Carcinoma of the Skin. Right leg. - No evidence of recurrence today - Recommend regular full body skin exams - Recommend daily broad spectrum sunscreen SPF 30+ [name] [name] sun-exposed areas, reapply every 2 hours as needed.  - Call if any new or changing lesions are noted between office visits   History of Basal Cell Carcinoma of the Skin. Right lower pretibia. 10/2010. - No evidence of recurrence today - Recommend regular full body skin exams -  Recommend daily broad spectrum sunscreen SPF 30+ [name] [name] sun-exposed areas, reapply every 2 hours as needed.  - Call if any new or changing lesions are noted between office visits   History of Dysplastic Nevus. Spinal upper back. Severe. Excised 01/15/2022. - No evidence of recurrence today - Recommend regular full body skin exams - Recommend daily broad spectrum sunscreen SPF 30+ [name] [name] sun-exposed areas, reapply every 2 hours as needed.  - Call if any new or changing lesions are noted between office visits   Lentigines - Scattered tan macules - Due [name] [name] sun exposure - Benign-appearing, observe - Recommend daily broad spectrum sunscreen SPF 30+ [name] [name] sun-exposed areas, reapply every 2 hours as needed. - Call for any changes  Seborrheic Keratoses - Stuck-on, waxy, tan-brown papules and/or plaques  - Benign-appearing - Discussed benign etiology and prognosis. - Observe - Call for any changes  Melanocytic Nevi - Tan-brown and/or pink-flesh-colored symmetric macules and papules - Benign appearing on exam today - Observation - Call clinic for new or changing moles - Recommend daily use of broad spectrum spf 30+ sunscreen [name] [name] sun-exposed areas.   Hemangiomas - Red papules - Discussed benign nature - Observe - Call for any changes  Actinic Damage - Chronic condition, secondary [name] [name] cumulative UV/sun exposure - diffuse scaly erythematous macules with underlying dyspigmentation - Recommend daily broad spectrum sunscreen SPF 30+ [name] [name] sun-exposed areas, reapply every 2 hours as needed.  - Staying in the shade or wearing long sleeves, sun glasses (UVA+UVB protection) and wide brim hats (4-inch brim around the entire circumference of the hat) are  also recommended for sun protection.  - Call for new or changing lesions.  Skin cancer screening performed today.  Melanoma in situ of other site [facility]) vulva  Keep scheduled appt for surgery 02/12/2023 with Dr. Bernadene Bell at [place].   Inflamed seborrheic  keratosis chest, right lateral vulva at inguinal crease, right hand dorsum, right flank  Patient deferred treatment at this time due [name] [name] upcoming surgery. Consider treatment at follow up once healed from MIS surgery.   Intertrigo Inguinal Areas  Chronic condition with duration or expected duration over one year. Currently well-controlled.   Intertrigo is a chronic recurrent rash that occurs in skin fold areas that may be associated with friction; heat; moisture; yeast; fungus; and bacteria.  It is exacerbated by increased movement / activity; sweating; and higher atmospheric temperature.   Continue Nystatin powder prn  Seborrheic keratosis left upper temple  Reassured benign age-related growth.  Recommend observation.  Discussed cryotherapy if spot(s) become irritated or inflamed.  History of SCC (squamous cell carcinoma) of skin  History of basal cell carcinoma (BCC) of skin  History of dysplastic nevus  Lentigo  Nevus  Hemangioma of skin  Actinic skin damage  Skin cancer screening   Return in about 6 months (around 08/07/2023) for TBSE, HxMIS, HxSCC, HxBCC, HxDN.  I, [name] [name] [name], CMA, am acting as scribe for Brendolyn Patty, MD.  Documentation: I have reviewed the above documentation for accuracy and completeness, and I agree with the above.  Brendolyn Patty MD

## 2023-02-06 NOTE — Patient Instructions (Signed)
Recommend daily broad spectrum sunscreen SPF 30+ to sun-exposed areas, reapply every 2 hours as needed. Call for new or changing lesions.  Staying in the shade or wearing long sleeves, sun glasses (UVA+UVB protection) and wide brim hats (4-inch brim around the entire circumference of the hat) are also recommended for sun protection.    Melanoma ABCDEs  Melanoma is the most dangerous type of skin cancer, and is the leading cause of death from skin disease.  You are more likely to develop melanoma if you: Have light-colored skin, light-colored eyes, or red or blond hair Spend a lot of time in the sun Tan regularly, either outdoors or in a tanning bed Have had blistering sunburns, especially during childhood Have a close family member who has had a melanoma Have atypical moles or large birthmarks  Early detection of melanoma is key since treatment is typically straightforward and cure rates are extremely high if we catch it early.   The first sign of melanoma is often a change in a mole or a new dark spot.  The ABCDE system is a way of remembering the signs of melanoma.  A for asymmetry:  The two halves do not match. B for border:  The edges of the growth are irregular. C for color:  A mixture of colors are present instead of an even brown color. D for diameter:  Melanomas are usually (but not always) greater than 79m - the size of a pencil eraser. E for evolution:  The spot keeps changing in size, shape, and color.  Please check your skin once per month between visits. You can use a small mirror in front and a large mirror behind you to keep an eye on the back side or your body.   If you see any new or changing lesions before your next follow-up, please call to schedule a visit.  Please continue daily skin protection including broad spectrum sunscreen SPF 30+ to sun-exposed areas, reapplying every 2 hours as needed when you're outdoors.   Staying in the shade or wearing long sleeves, sun  glasses (UVA+UVB protection) and wide brim hats (4-inch brim around the entire circumference of the hat) are also recommended for sun protection.    Due to recent changes in healthcare laws, you may see results of your pathology and/or laboratory studies on MyChart before the doctors have had a chance to review them. We understand that in some cases there may be results that are confusing or concerning to you. Please understand that not all results are received at the same time and often the doctors may need to interpret multiple results in order to provide you with the best plan of care or course of treatment. Therefore, we ask that you please give u[place]2 business days to thoroughly review all your results before contacting the office for clarification. Should we see a critical lab result, you will be contacted sooner.   If You Need Anything After Your Visit  If you have any questions or concerns for your doctor, please call our main line at [phone] press option 4 to reach your doctor's medical assistant. If no one answers, please leave a voicemail as directed and we will return your call as soon as possible. Messages left after 4 pm will be answered the following business day.   You may also send [place] message via [place] We typically respond to MyChart messages within 1-2 business days.  For prescription refills, please ask your pharmacy to contact our  office. Our fax number is [phone].  If you have an urgent issue when the clinic is closed that cannot wait until the next business day, you can page your doctor at the number below.    Please note that while we do our best to be available for urgent issues outside of office hours, we are not available 24/7.   If you have an urgent issue and are unable to reach [place], you may choose to seek medical care at your doctor's office, retail clinic, urgent care center, or emergency room.  If you have a medical emergency, please immediately call  911 or go to the emergency department.  Pager Numbers  - Dr. Nehemiah Massed: [phone]  - Dr. Laurence Ferrari: [phone]  - Dr. Nicole Kindred: [phone]  In the event of inclement weather, please call our main line at [phone] for an update on the status of any delays or closures.  Dermatology Medication Tips: Please keep the boxes that topical medications come in in order to help keep track of the instructions about where and how to use these. Pharmacies typically print the medication instructions only on the boxes and not directly on the medication tubes.   If your medication is too expensive, please contact our office at [phone] option 4 or send [place] a message through [place].   We are unable to tell what your co-pay for medications will be in advance as this is different depending on your insurance coverage. However, we may be able to find a substitute medication at lower cost or fill out paperwork to get insurance to cover a needed medication.   If a prior authorization is required to get your medication covered by your insurance company, please allow [place] 1-2 business days to complete this process.  Drug prices often vary depending on where the prescription is filled and some pharmacies may offer cheaper prices.  The website www.goodrx.com contains coupons for medications through different pharmacies. The prices here do not account for what the cost may be with help from insurance (it may be cheaper with your insurance), but the website can give you the price if you did not use any insurance.  - You can print the associated coupon and take it with your prescription to the pharmacy.  - You may also stop by our office during regular business hours and pick up a GoodRx coupon card.  - If you need your prescription sent electronically to a different pharmacy, notify our office through [facility] or by phone at [phone] option 4.     Si Usted Necesita Algo Despus de Su  Visita  Tambin puede enviarnos un mensaje a travs de Pharmacist, community. Por lo general respondemos a los mensajes de MyChart en el transcurso de 1 a 2 das hbiles.  Para renovar recetas, por favor pida a su farmacia que se ponga en contacto con nuestra oficina. Harland Dingwall de fax es [place] [phone].  Si tiene un asunto urgente cuando la clnica est cerrada y que no puede esperar hasta el siguiente da hbil, puede llamar/localizar a su doctor(a) al nmero que aparece a continuacin.   Por favor, tenga en cuenta que aunque hacemos todo lo posible para estar disponibles para asuntos urgentes fuera del horario de [place], no estamos disponibles las 24 horas del da, los 7 das de la [place].   Si tiene un problema urgente y no puede comunicarse con nosotros, puede optar por buscar atencin mdica  en el consultorio de su doctor(a), en una clnica privada, en  un centro de atencin urgente o en una sala de emergencias.  Si tiene Engineering geologist, por favor llame inmediatamente al 911 o vaya a la sala de emergencias.  Nmeros de bper  - Dr. Nehemiah Massed: [phone]  - Dra. Moye: [phone]  - Dra. Nicole Kindred: [phone]  En caso de inclemencias del [place], por favor llame a Johnsie Kindred principal al [phone] para una actualizacin sobre el [place] de cualquier retraso o cierre.  Consejos para la medicacin en dermatologa: Por favor, guarde las cajas en las que vienen los medicamentos de uso tpico para ayudarle a seguir las instrucciones sobre dnde y cmo usarlos. Las farmacias generalmente imprimen las instrucciones del medicamento slo en las cajas y no directamente en los tubos del [place].   Si su medicamento es muy caro, por favor, pngase en contacto con Zigmund Daniel llamando al [phone] y presione la opcin 4 o envenos un mensaje a travs de Pharmacist, community.   No podemos decirle cul ser su copago por los medicamentos por adelantado ya que esto es diferente dependiendo de la  cobertura de su seguro. Sin embargo, es posible que podamos encontrar un medicamento sustituto a Electrical engineer un formulario para que el seguro cubra el medicamento que se considera necesario.   Si se requiere una autorizacin previa para que su compaa de seguros [place] su medicamento, por favor permtanos de 1 a 2 das hbiles para completar este proceso.  Los precios de los medicamentos varan con frecuencia dependiendo del Environmental consultant de dnde se surte la receta y alguna farmacias pueden ofrecer precios ms baratos.  El sitio web www.goodrx.com tiene cupones para medicamentos de Airline pilot. Los precios aqu no tienen en cuenta lo que podra costar con la ayuda del seguro (puede ser ms barato con su seguro), pero el sitio web puede darle el precio si no utiliz Research scientist (physical sciences).  - Puede imprimir el cupn correspondiente y llevarlo con su receta a la farmacia.  - Tambin puede pasar por nuestra oficina durante el horario de atencin regular y Charity fundraiser una tarjeta de cupones de GoodRx.  - Si necesita que su receta se enve electrnicamente a una farmacia diferente, informe a nuestra oficina a travs de MyChart de [place] o por telfono llamando al [phone] y presione la opcin 4.

## 2023-02-07 ENCOUNTER — Ambulatory Visit: Payer: Medicare HMO | Admitting: Psychology

## 2023-02-11 ENCOUNTER — Telehealth: Payer: Self-pay

## 2023-02-11 NOTE — Anesthesia Preprocedure Evaluation (Addendum)
Anesthesia Evaluation  Patient identified by MRN, date of birth, ID band Patient awake    Reviewed: Allergy & Precautions, NPO status , Patient's Chart, lab work & pertinent test results  Airway Mallampati: II  TM Distance: >3 FB Neck ROM: Full    Dental no notable dental hx. 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)     Musculoskeletal  (+) Arthritis ,    Abdominal   Peds  Hematology   Anesthesia Other Findings All: Tegretol, codeine, lithium, amantidine,  Reproductive/Obstetrics negative OB ROS                             Anesthesia Physical Anesthesia Plan  ASA: 3  Anesthesia Plan: General   Post-op Pain Management: Ketamine IV* and Tylenol PO (pre-op)*   Induction: Intravenous  PONV Risk Score and Plan: Treatment may vary due to age or medical condition, Midazolam, Ondansetron and Dexamethasone  Airway Management Planned: LMA  Additional Equipment: None  Intra-op Plan:   Post-operative Plan: Extubation in OR  Informed Consent: I have reviewed the patients History and Physical, chart, labs and discussed the procedure including the risks, benefits and alternatives for the proposed anesthesia with the patient or authorized representative who has indicated his/her understanding and acceptance.     Dental advisory given  Plan Discussed with:   Anesthesia Plan Comments:        Anesthesia Quick Evaluation

## 2023-02-11 NOTE — Telephone Encounter (Signed)
Telephone call to check on pre-operative status.  Patient compliant with pre-operative instructions.  Reinforced nothing to eat after midnight. Clear liquids until 9:30. Patient to arrive at 10:30.  No questions or concerns voiced.  Instructed to call for any needs.

## 2023-02-12 ENCOUNTER — Encounter (HOSPITAL_BASED_OUTPATIENT_CLINIC_OR_DEPARTMENT_OTHER)
Admission: RE | Disposition: A | Discharge status: Home / Self Care | Payer: Self-pay | Source: Home / Self Care | Attending: Psychiatry

## 2023-02-12 ENCOUNTER — Other Ambulatory Visit: Payer: Self-pay

## 2023-02-12 ENCOUNTER — Ambulatory Visit (HOSPITAL_BASED_OUTPATIENT_CLINIC_OR_DEPARTMENT_OTHER): Payer: Medicare HMO | Admitting: Anesthesiology

## 2023-02-12 ENCOUNTER — Ambulatory Visit (HOSPITAL_BASED_OUTPATIENT_CLINIC_OR_DEPARTMENT_OTHER)
Admission: RE | Admit: 2023-02-12 | Discharge: 2023-02-12 | Disposition: A | Discharge status: Home / Self Care | Payer: Medicare HMO | Attending: Psychiatry | Admitting: Psychiatry

## 2023-02-12 ENCOUNTER — Encounter (HOSPITAL_BASED_OUTPATIENT_CLINIC_OR_DEPARTMENT_OTHER): Payer: Self-pay | Admitting: Psychiatry

## 2023-02-12 ENCOUNTER — Encounter: Payer: Medicare HMO | Admitting: Dermatology

## 2023-02-12 DIAGNOSIS — M1990 Unspecified osteoarthritis, unspecified site: Secondary | ICD-10-CM

## 2023-02-12 DIAGNOSIS — K219 Gastro-esophageal reflux disease without esophagitis: Secondary | ICD-10-CM | POA: Diagnosis not present

## 2023-02-12 DIAGNOSIS — F319 Bipolar disorder, unspecified: Secondary | ICD-10-CM | POA: Diagnosis not present

## 2023-02-12 DIAGNOSIS — E039 Hypothyroidism, unspecified: Secondary | ICD-10-CM | POA: Diagnosis not present

## 2023-02-12 DIAGNOSIS — D038 Melanoma in situ of other sites: Secondary | ICD-10-CM

## 2023-02-12 HISTORY — PX: VULVECTOMY: SHX1086

## 2023-02-12 HISTORY — DX: Prediabetes: R73.03

## 2023-02-12 SURGERY — WIDE EXCISION VULVECTOMY
Anesthesia: General | Site: Vagina

## 2023-02-12 MED ORDER — PROPOFOL 10 MG/ML IV BOLUS
INTRAVENOUS | Status: DC | PRN
  Administered 2023-02-12: 140 mg via INTRAVENOUS

## 2023-02-12 MED ORDER — ACETAMINOPHEN 500 MG PO TABS
ORAL_TABLET | ORAL | Status: AC
  Filled 2023-02-12: qty 2

## 2023-02-12 MED ORDER — HYDROMORPHONE HCL 1 MG/ML IJ SOLN
INTRAMUSCULAR | Status: AC
  Filled 2023-02-12: qty 1

## 2023-02-12 MED ORDER — MIDAZOLAM HCL 2 MG/2ML IJ SOLN
INTRAMUSCULAR | Status: AC
  Filled 2023-02-12: qty 2

## 2023-02-12 MED ORDER — KETAMINE HCL 10 MG/ML IJ SOLN
INTRAMUSCULAR | Status: DC | PRN
  Administered 2023-02-12: 5 mg via INTRAVENOUS
  Administered 2023-02-12: 15 mg via INTRAVENOUS
  Administered 2023-02-12: 5 mg via INTRAVENOUS

## 2023-02-12 MED ORDER — BUPIVACAINE HCL 0.25 % IJ SOLN
INTRAMUSCULAR | Status: DC | PRN
  Administered 2023-02-12: 20 mL

## 2023-02-12 MED ORDER — KETOROLAC TROMETHAMINE 30 MG/ML IJ SOLN: 30.0000 mg | Freq: Once | INTRAMUSCULAR | Status: DC | PRN

## 2023-02-12 MED ORDER — OXYCODONE HCL 5 MG/5ML PO SOLN: 5.0000 mg | Freq: Once | ORAL | Status: DC | PRN

## 2023-02-12 MED ORDER — PROPOFOL 10 MG/ML IV BOLUS
INTRAVENOUS | Status: AC
  Filled 2023-02-12: qty 20

## 2023-02-12 MED ORDER — 0.9 % SODIUM CHLORIDE (POUR BTL) OPTIME
TOPICAL | Status: DC | PRN
  Administered 2023-02-12: 500 mL

## 2023-02-12 MED ORDER — ONDANSETRON HCL 4 MG/2ML IJ SOLN: 4.0000 mg | Freq: Once | INTRAMUSCULAR | Status: DC | PRN

## 2023-02-12 MED ORDER — LACTATED RINGERS IV SOLN: INTRAVENOUS | Status: DC

## 2023-02-12 MED ORDER — DEXAMETHASONE SODIUM PHOSPHATE 10 MG/ML IJ SOLN
4.0000 mg | INTRAMUSCULAR | Status: AC
  Administered 2023-02-12: 10 mg via INTRAVENOUS

## 2023-02-12 MED ORDER — MIDAZOLAM HCL 5 MG/5ML IJ SOLN
INTRAMUSCULAR | Status: DC | PRN
  Administered 2023-02-12: 2 mg via INTRAVENOUS

## 2023-02-12 MED ORDER — KETAMINE HCL 50 MG/5ML IJ SOSY
PREFILLED_SYRINGE | INTRAMUSCULAR | Status: AC
  Filled 2023-02-12: qty 5

## 2023-02-12 MED ORDER — OXYCODONE HCL 5 MG PO TABS: 5.0000 mg | ORAL_TABLET | Freq: Once | ORAL | Status: DC | PRN

## 2023-02-12 MED ORDER — DEXAMETHASONE SODIUM PHOSPHATE 10 MG/ML IJ SOLN
INTRAMUSCULAR | Status: AC
  Filled 2023-02-12: qty 1

## 2023-02-12 MED ORDER — FENTANYL CITRATE (PF) 100 MCG/2ML IJ SOLN
INTRAMUSCULAR | Status: DC | PRN
  Administered 2023-02-12 (×2): 25 ug via INTRAVENOUS
  Administered 2023-02-12: 50 ug via INTRAVENOUS

## 2023-02-12 MED ORDER — HYDROMORPHONE HCL 1 MG/ML IJ SOLN
0.2500 mg | INTRAMUSCULAR | Status: DC | PRN
  Administered 2023-02-12 (×2): 0.5 mg via INTRAVENOUS

## 2023-02-12 MED ORDER — ACETAMINOPHEN 500 MG PO TABS
1000.0000 mg | ORAL_TABLET | ORAL | Status: AC
  Administered 2023-02-12: 1000 mg via ORAL

## 2023-02-12 MED ORDER — ONDANSETRON HCL 4 MG/2ML IJ SOLN
INTRAMUSCULAR | Status: DC | PRN
  Administered 2023-02-12: 4 mg via INTRAVENOUS

## 2023-02-12 MED ORDER — FENTANYL CITRATE (PF) 100 MCG/2ML IJ SOLN
INTRAMUSCULAR | Status: AC
  Filled 2023-02-12: qty 2

## 2023-02-12 MED ORDER — ONDANSETRON HCL 4 MG/2ML IJ SOLN
INTRAMUSCULAR | Status: AC
  Filled 2023-02-12: qty 2

## 2023-02-12 SURGICAL SUPPLY — 39 items
BLADE CLIPPER SENSICLIP SURGIC (BLADE) IMPLANT
BLADE SURG 15 STRL LF DISP TIS (BLADE) ×1 IMPLANT
BLADE SURG 15 STRL SS (BLADE) ×2
CATH FOLEY 2WAY SLVR  5CC 16FR (CATHETERS) ×1
CATH FOLEY 2WAY SLVR 5CC 16FR (CATHETERS) IMPLANT
CATH ROBINSON RED A/P 16FR (CATHETERS) ×1 IMPLANT
DRSG TELFA 3X8 NADH STRL (GAUZE/BANDAGES/DRESSINGS) IMPLANT
GAUZE 4X4 16PLY ~~LOC~~+RFID DBL (SPONGE) IMPLANT
GLOVE BIO SURGEON STRL SZ 6 (GLOVE) ×2 IMPLANT
GLOVE BIOGEL PI IND STRL 6.5 (GLOVE) ×1 IMPLANT
GLOVE BIOGEL PI IND STRL 7.0 (GLOVE) IMPLANT
GLOVE SURG SS PI 7.5 STRL IVOR (GLOVE) IMPLANT
GOWN STRL REUS W/TWL LRG LVL3 (GOWN DISPOSABLE) ×1 IMPLANT
HEMOSTAT SURGICEL 2X14 (HEMOSTASIS) IMPLANT
KIT TURNOVER CYSTO (KITS) ×1 IMPLANT
LEGGING LITHOTOMY PAIR STRL (DRAPES) ×1 IMPLANT
NDL HYPO 25X1 1.5 SAFETY (NEEDLE) ×1 IMPLANT
NEEDLE HYPO 25X1 1.5 SAFETY (NEEDLE) ×1 IMPLANT
NS IRRIG 500ML POUR BTL (IV SOLUTION) ×1 IMPLANT
PACK PERINEAL COLD (PAD) ×1 IMPLANT
PACK VAGINAL WOMENS (CUSTOM PROCEDURE TRAY) ×1 IMPLANT
PUNCH BIOPSY DERMAL 3 (INSTRUMENTS) IMPLANT
PUNCH BIOPSY DERMAL 3MM (INSTRUMENTS)
PUNCH BIOPSY DERMAL 4MM (INSTRUMENTS) IMPLANT
SLEEVE SCD COMPRESS KNEE MED (STOCKING) ×1 IMPLANT
SUT VIC AB 0 CT1 27 (SUTURE) ×1
SUT VIC AB 0 CT1 27XBRD ANBCTR (SUTURE) IMPLANT
SUT VIC AB 0 SH 27 (SUTURE) ×1 IMPLANT
SUT VIC AB 2-0 CT2 27 (SUTURE) IMPLANT
SUT VIC AB 2-0 SH 27 (SUTURE) ×1
SUT VIC AB 2-0 SH 27XBRD (SUTURE) IMPLANT
SUT VIC AB 3-0 SH 27 (SUTURE) ×2
SUT VIC AB 3-0 SH 27X BRD (SUTURE) ×1 IMPLANT
SUT VIC AB 4-0 PS2 18 (SUTURE) ×1 IMPLANT
SUT VIC AB 4-0 SH 27 (SUTURE)
SUT VIC AB 4-0 SH 27XANBCTRL (SUTURE) IMPLANT
SWAB OB GYN 8IN STERILE 2PK (MISCELLANEOUS) IMPLANT
SYR BULB IRRIG 60ML STRL (SYRINGE) ×1 IMPLANT
TOWEL OR 17X24 6PK STRL BLUE (TOWEL DISPOSABLE) ×1 IMPLANT

## 2023-02-12 NOTE — Anesthesia Postprocedure Evaluation (Signed)
Anesthesia Post Note  Patient: [name] [name]  Procedure(s) Performed: WIDE LOCAL EXCISION OF VULVA AND REMOVAL OF CLITORIS (Vagina )     Patient location during evaluation: PACU Anesthesia Type: General Level of consciousness: awake and alert Pain management: pain level controlled Vital Signs Assessment: post-procedure vital signs reviewed and stable Respiratory status: spontaneous breathing, nonlabored ventilation, respiratory function stable and patient connected to nasal cannula oxygen Cardiovascular status: blood pressure returned to baseline and stable Postop Assessment: no apparent nausea or vomiting Anesthetic complications: no  No notable events documented.  Last Vitals:  Vitals:   02/12/23 1452 02/12/23 1548  BP:  (!) 155/73  Pulse: (!) 51 (!) 55  Resp: 10 14  Temp:  37.1 C  SpO2: 98% 97%    Last Pain:  Vitals:   02/12/23 1548  TempSrc: Oral  PainSc:                  Barnet [place]

## 2023-02-12 NOTE — Anesthesia Procedure Notes (Signed)
Procedure Name: LMA Insertion Date/Time: 02/12/2023 12:37 PM  Performed by: Lindsie Simar D, CRNAPre-anesthesia Checklist: Patient identified, Emergency Drugs available, Suction available and Patient being monitored Patient Re-evaluated:Patient Re-evaluated prior to induction Oxygen Delivery Method: Circle system utilized Preoxygenation: Pre-oxygenation with 100% oxygen Induction Type: IV induction Ventilation: Mask ventilation without difficulty LMA: LMA inserted LMA Size: 3.0 Tube type: Oral Number of attempts: 1 Placement Confirmation: positive ETCO2 and breath sounds checked- equal and bilateral Tube secured with: Tape Dental Injury: Teeth and Oropharynx as per pre-operative assessment

## 2023-02-12 NOTE — Transfer of Care (Signed)
Immediate Anesthesia Transfer of Care Note  Patient: [name] [name]  Procedure(s) Performed: WIDE LOCAL EXCISION OF VULVA AND REMOVAL OF CLITORIS (Vagina )  Patient Location: PACU  Anesthesia Type:General  Level of Consciousness: awake, alert , and oriented  Airway & Oxygen Therapy: Patient Spontanous Breathing and Patient connected to nasal cannula oxygen  Post-op Assessment: Report given to RN and Post -op Vital signs reviewed and stable  Post vital signs: Reviewed and stable  Last Vitals:  Vitals Value Taken Time  BP 142/62 02/12/23 1403  Temp    Pulse 63 02/12/23 1404  Resp 11 02/12/23 1404  SpO2 99 % 02/12/23 1404  Vitals shown include unvalidated device data.  Last Pain:  Vitals:   02/12/23 1050  TempSrc: Oral  PainSc: 0-No pain      Patients Stated Pain Goal: 4 ([id number])  Complications: No notable events documented.

## 2023-02-12 NOTE — Op Note (Signed)
GYNECOLOGIC ONCOLOGY OPERATIVE NOTE  Date of Service: 02/12/2023  Preoperative Diagnosis: VULVAR MELANOMA IN SITU  Postoperative Diagnosis: Same  Procedures: Wide local excision of the vulva and removal of the clitoris  Surgeon: Bernadene Bell, MD  Assistants: None  Anesthesia: General  Estimated Blood Loss: 20 ml  Fluids: 850 ml, crystalloid  Urine Output: unmeasured I&O, clear yellow  Findings: Well healed vulva from prior partial vulvectomy. No overt evidence of persistent melanoma but mild skin changes of the left clitoral hood and adjacent skin, possibly extending to the anterior bilateral labia minora. IOFS from initial margin biopsies with possible abnormal tissue in lateral margin. Additional margin obtained.   Specimens:  ID Type Source Tests Collected by Time Destination  1 : anterior margin Tissue PATH Gyn biopsy SURGICAL PATHOLOGY Bernadene Bell, MD 02/12/2023 1147   2 : left anterior margin Tissue PATH Gyn biopsy SURGICAL PATHOLOGY Bernadene Bell, MD 02/12/2023 1252   3 : left medial margin Tissue PATH Gyn biopsy SURGICAL PATHOLOGY Bernadene Bell, MD 02/12/2023 1252   4 : anterior vulva with left and right labia minora (stitch at  12 o'clock) Tissue PATH Gyn biopsy SURGICAL PATHOLOGY Bernadene Bell, MD 02/12/2023 1302   5 : left lateral margin (stitch on lateral margin) Tissue PATH Gyn biopsy SURGICAL PATHOLOGY Bernadene Bell, MD [id number]     Complications:  None  Indications for Procedure: [name] [name] is a 67 y.o. woman with melanoma in situ of the vulva with positive margins on prior resection, now requiring re-excision including removal of clitoris.  Prior to the procedure, all risks, benefits, and alternatives were discussed and informed surgical consent was signed.  Procedure: Patient was taken to the operating room where general anesthesia was achieved.  She was positioned in dorsal lithotomy and prepped and draped.   A thorough exam of the vulva  with the findings as noted above. The area to be removed was outlined with a marking pen. The skin was tented up and a biopsy excised with scalpel at the anterior margin, the left anterior margin and the left medial margin. These biopsies were sent to pathology for frozen path given amelanotic lesion making identification of margins challenging.  A scalpel was used to incise around the lesion including the clitoral hood, clitoris, and bilateral labia minora. Allis clamps are used to grasp the lesion and a wide local excision was performed in the standard fashion with aid of cautery. The clitoral vessels were isolated, ligated with 0-vicryl and transected with cautery. After the lesion was removed, the wound bed was made hemostatic with cautery.  At this time, a foley catheter was inserted into the bladder to better delineate the urethral opening. Several deep stitches with 2-0 and 3-0 vicryl suture were placed to bring the wound edges together. At this time, frozen pathology noted possible abnormal cells in the left anterior margin. Using the scalpel, an additional lateral margin was obtained. Then 4-0 vicryl mattress stitches were place at the skin edges to re-appoximate the wound. 0.25% marcaine was infiltrated in standard fashion. Hemostasis was noted at the end of the procedure. The catheter was removed.  Patient tolerated the procedure well. Sponge, lap, and instrument counts were correct.  No perioperative antibiotics were indicated for this procedure.  Patient was extubated and taken to the PACU in stable condition.  Bernadene Bell, MD Gynecologic Oncology

## 2023-02-12 NOTE — Discharge Instructions (Addendum)
AFTER SURGERY INSTRUCTIONS   Return to work:  variable based on occupation   We recommend purchasing several bags of frozen green peas and dividing them into ziploc bags. You will want to keep these in the freezer and have them ready to use as ice packs to the vulvar incision. Once the ice pack is no longer cold, you can get another from the freezer. The frozen peas mold to your body better than a regular ice pack.      Activity: 1. Be up and out of the bed during the day.  Take a nap if needed.  You may walk up steps but be careful and use the hand rail.  Stair climbing will tire you more than you think, you may need to stop part way and rest.    2. No lifting or straining for 4 weeks over 10 pounds. No pushing, pulling, straining for 4 weeks.   3. No driving for minimum 24 hours after surgery but this is usually longer since you need to be able to brake safely and be off pain meds.  Do not drive if you are taking narcotic pain medicine and make sure that your reaction time has returned.    4. You can shower as soon as the next day after surgery. Shower daily. No tub baths or submerging your body in water until cleared by your surgeon. If you have the soap that was given to you by pre-surgical testing that was used before surgery, you do not need to use it afterwards because this can irritate your incisions.    5. No sexual activity and nothing in the vagina for 4 weeks.   6. You may experience vaginal spotting and discharge after surgery.  The spotting is normal but if you experience heavy bleeding, call our office.   7. Take Tylenol first for pain if you are able to take these medication and only use narcotic pain medication for severe pain not relieved by the Tylenol.  Monitor your Tylenol intake to a max of 4,000 mg in a 24 hour period.    Diet: 1. Low sodium Heart Healthy Diet is recommended but you are cleared to resume your normal (before surgery) diet after your procedure.   2. It is  safe to use a laxative, such as Miralax or Colace, if you have difficulty moving your bowels. You have been prescribed Sennakot at bedtime every evening to keep bowel movements regular and to prevent constipation.     Wound Care: 1. Keep clean and dry.  Shower daily.   Reasons to call the Doctor: Fever - Oral temperature greater than 100.4 degrees Fahrenheit Foul-smelling vaginal discharge Difficulty urinating Nausea and vomiting Increased pain at the site of the incision that is unrelieved with pain medicine. Difficulty breathing with or without chest pain New calf pain especially if only on one side Sudden, continuing increased vaginal bleeding with or without clots.   Contacts: For questions or concerns you should contact:   Dr. Bernadene Bell at [place], NP at [phone]   After Hours: call [phone] and have the GYN Oncologist paged/contacted (after 5 pm or on the weekends).   Messages sent via mychart are for non-urgent matters and are not responded to after hours so for urgent needs, please call the after hours number. Post Anesthesia Home Care Instructions  Activity: Get plenty of rest for the remainder of the day. A responsible adult should stay with you for 24 hours following the  procedure.  For the next 24 hours, DO NOT: -Drive a car -Paediatric nurse -Drink alcoholic beverages -Take any medication unless instructed by your physician -Make any legal decisions or sign important papers.  Meals: Start with liquid foods such as gelatin or soup. Progress to regular foods as tolerated. Avoid greasy, spicy, heavy foods. If nausea and/or vomiting occur, drink only clear liquids until the nausea and/or vomiting subsides. Call your physician if vomiting continues.  Special Instructions/Symptoms: Your throat may feel dry or sore from the anesthesia or the breathing tube placed in your throat during surgery. If this causes discomfort, gargle with  warm salt water. The discomfort should disappear within 24 hours.

## 2023-02-12 NOTE — Interval H&P Note (Signed)
History and Physical Interval Note:  02/12/2023 12:27 PM  [name] [name]  has presented today for surgery, with the diagnosis of [place].  The various methods of treatment have been discussed with the patient and family. After consideration of risks, benefits and other options for treatment, the patient has consented to  Procedure(s): WIDE LOCAL EXCISION OF VULVA AND REMOVAL OF CLITORIS (N/A) as a surgical intervention.  The patient's history has been reviewed, patient examined, no change in status, stable for surgery.  I have reviewed the patient's chart and labs.  Questions were answered to the patient's satisfaction.     [name]

## 2023-02-13 ENCOUNTER — Encounter (HOSPITAL_BASED_OUTPATIENT_CLINIC_OR_DEPARTMENT_OTHER): Payer: Self-pay | Admitting: Psychiatry

## 2023-02-13 ENCOUNTER — Telehealth: Payer: Self-pay

## 2023-02-13 NOTE — Telephone Encounter (Signed)
Spoke with [name] [name] this morning. She states she is eating, drinking and urinating well. She has not had a BM yet but is passing gas. She is taking senokot as prescribed and encouraged her to drink plenty of water. She denies fever or chills. Incision is dry and intact. Very little bloody drainage from incision today.  She rates her pain 2-3/10. Her pain is controlled with Tramadol alternating with tylenol. Using frozen peas to perineal area as instructed.    Instructed to call office with any fever, chills, purulent drainage, uncontrolled pain or any other questions or concerns. Patient verbalizes understanding.   Pt aware of post op appointments as well as the office number [phone] and after hours number [phone] to call if she has any questions or concerns

## 2023-02-15 ENCOUNTER — Ambulatory Visit (INDEPENDENT_AMBULATORY_CARE_PROVIDER_SITE_OTHER): Payer: Medicare HMO | Admitting: Psychology

## 2023-02-15 DIAGNOSIS — F3181 Bipolar II disorder: Secondary | ICD-10-CM | POA: Diagnosis not present

## 2023-02-15 NOTE — Progress Notes (Signed)
[place] Counselor/Therapist Progress Note  Patient ID: [name] [name], MRN: [id number],    Date: 02/15/2023  Time Spent: 45 minutes  Treatment Type: Individual Therapy  Reported Symptoms: none  Mental Status Exam: Appearance:  Casual     Behavior: Appropriate  Motor: Normal  Speech/Language:  normal  Affect: Appropriate  Mood: pleasant  Thought process: normal  Thought content:   WNL  Sensory/Perceptual disturbances:   WNL  Orientation: oriented to person, place, time/date, and situation  Attention: Good  Concentration: Good  Memory: WNL  Fund of knowledge:  Good  Insight:   Good  Judgment:  Good  Impulse Control: Good   Risk Assessment: Danger to Self:  No Self-injurious Behavior: No Danger to Others: No Duty to Warn:no Physical Aggression / Violence:No  Access to Firearms a concern: No  Gang Involvement:No   Subjective: The patient attended a face-to-face individual therapy session via video visit today.  The patient agreed and consented for this session to be on caregility.  The patient was in her home alone and the therapist was in the office.  The patient presents as pleasant and cooperative.  She just had surgery on March 5 and had part of her vulva removed and also her clitoris.  She states that she has been in a good bit of pain but we talked today about moving through the pain and this is going to be a short lived situation and that she will get through it and that she will get rid of the cancer that she had.  We talked about what has been going on and she stated that she was fearful prior to the surgery that she would not wake up.  I explained that that was normal to feel that way.  The patient talked about the support that she has been getting and she reported feeling much better after we had a conversation today.  I did not keep her very long because I was afraid that she was uncomfortable.  Scheduled another appointment for several weeks from  now.  The patient knows that she can contact me if she feels like she needs to have a session between now and then.  Interventions: Cognitive Behavioral Therapy  Diagnosis:Bipolar II disorder ([place])  Plan: Client Abilities/Strengths  intelligent, insightful  Client Treatment Preferences  Outpatient individual therapy  Client Statement of Needs  "I need someone to help monitor and manage my bipolar"  Treatment Level  Outpatient individual therapy  Symptoms  Depressed or irritable mood.: (Status: improved). Diminished interest in or  enjoyment of activities.: (Status: improved). Displays a poor attention span and is easily distracted.:  (Status: improved). Exhibits an abnormally and  persistently elevated, expansive, or irritable mood with at least three symptoms of mania (i.e., inflated  self-esteem or grandiosity, decreased need for sleep, pressured speech, flight of ideas, distractibility,  excessive goal-directed activity or psychomotor agitation, excessive involvement in pleasurable, highrisk behavior).:  (Status: improved). Feelings of hopelessness, worthlessness, or inappropriate guilt.: (Status: improved). History of at least one hypomanic,  manic, or mixed mood episode.:  (Status: maintained). History of chronic or  recurrent depression for which the client has taken antidepressant medication, been hospitalized, had  outpatient treatment, or had a course of electroconvulsive therapy.:  (Status:  maintained). Lack of energy.: (Status: improved). Lacks follow-through in  projects, even though energy is very high, since behavior lacks discipline and goal-directedness.:  (Status: improved). Poor concentration and indecisiveness.:  (Status: improved). Reports flight of ideas or  thoughts racing.: (Status: improved). Shows evidence of a decreased need for sleep.: (Status: improved).  Sleeplessness or hypersomnia.:  (Status: improved). Social withdrawal.: (Status: improved). Suicidal  thoughts and/or gestures.:   (Status: improved). The elevated mood or irritability (mania) causes marked impairment in  occupational functioning, social activities, or relationships with others.: (Status: improved). Verbalizes grandiose ideas and/or persecutory beliefs.:  (Status: improved).  Problems Addressed  Bipolar Disorder - Mania, Bipolar Disorder - Depression, Bipolar Disorder - Mania, Bipolar Disorder -  Mania, Bipolar Disorder - Depression, Bipolar Disorder - Depression, Bipolar Disorder - Mania  Goals 1. Achieve controlled behavior, moderated mood, more deliberative  speech and thought process, and a stable daily activity pattern. 2. Achieve controlled behavior, moderated mood, more deliberative  speech and thought process, and a stable daily activity pattern. 3. Alleviate manic/hypomanic mood and return to previous level of  effective functioning. 4. Develop healthy cognitive patterns and beliefs about self and the world  that lead to alleviation and help prevent the relapse of  manic/hypomanic episodes. Objective Maintain a pattern of regular rhythm to daily activities. Target Date: 2023-05-08 Frequency: Monthly Progress: 80 Modality: individual Related Interventions 1. Assist the client in establishing a more routine pattern of daily activities such as sleeping,  eating, solitary and social activities, and exercise; use and review a form to schedule, assess,  and modify these activities so that they occur in a predictable rhythm every day. Objective Identify and replace thoughts and behaviors that trigger manic or depressive symptoms. Target Date: 2023-05-08 Frequency: Monthly Progress: 80 Modality: individual Related Interventions 1. Use cognitive therapy techniques to explore and educate the client about cognitive biases that  trigger his/her elevated or depressive mood (see Cognitive Therapy for Bipolar Disorder by  Lanny Hurst al.). 2. Teach the client  cognitive-behavioral coping and relapse prevention skills including delaying  impulsive actions, structured scheduling of daily activities, keeping a regular sleep routine,  avoiding unrealistic goal striving, using relaxation procedures, identifying and avoiding episode triggers such as stimulant drug use, alcohol consumption, breaking sleep routine, or exposing  self to high stress (see Cognitive Therapy for Bipolar Disorder by Lanny Hurst al.). 3. Use cognitive therapy techniques to explore and educate the client's about cognitive biases that  trigger his/her elevated or depressive mood (see Cognitive Therapy for Bipolar Disorder by  Lanny Hurst al.). Objective Discuss and resolve troubling personal and interpersonal issues. Target Date: 2023-05-08 Frequency: Monthly Progress: 60 Modality: individual Objective Participate in periodic "maintenance" sessions. Target Date: 2023-05-08 Frequency: Monthly Progress: 80 Modality: individual Related Interventions 1. Hold periodic "maintenance" sessions within the first few months after therapy to facilitate the  client's positive changes; problem-solve obstacles to improvement. 2. Hold periodic "maintenance" sessions within the first few months after therapy to facilitate the  client's positive changes; problem-solve obstacles to improvement. Objective Describe mood state, energy level, amount of control over thoughts, and sleeping pattern. Target Date: 2023-05-08 Frequency: Monthly Progress: 80 Modality: individual Related Interventions 1. Encourage the client to share his/her thoughts and feelings; express empathy and build rapport  while assessing primary cognitive, behavioral, interpersonal, or other symptoms of the mood  disorder. 2. Assess presence, severity, and impact of past and present mood episodes on social,  occupational, and interpersonal functioning; supplement with semi-structured inventory, if  desired (e.g., Montgomery-Asberg  Depression Rating Scale, Inventory to Diagnose  Depression). 3. Encourage the client to share his/her thoughts and feelings; express empathy, and build rapport  while assessing primary cognitive, behavioral, interpersonal, or other symptoms of the  mood  disorder. 4. Assess presence, severity, and impact of past and present mood episodes including mania (i.e.,  pressured speech, impulsive behavior, euphoric mood, flight of ideas, reduced need for sleep,  inflated self-esteem, and high energy) on social, occupational, and interpersonal functioning;  supplement with semi-structured inventory, if desired (e.g., Young Mania Rating Scale; the  Clinical Monitoring Form); re-administer as indicated to assess treatment response. 5. Develop healthy cognitive patterns and beliefs about self and the world  that lead to alleviation and help prevent the relapse of mood  episodes. 6. Normalize energy level and return to usual activities, good judgment,  stable mood, more realistic expectations, and goal-directed behavior. 7. Normalize energy level and return to usual activities, good judgment,  stable mood, more realistic expectations, and goal-directed behavior. Diagnosis 296.52 (Bipolar I disorder, most recent episode (or current) depressed, moderate)  Medications  1. Ambien (Dosage: '10mg'$  qhs)  2. Klonopin (Dosage: '1mg'$  TID)  3. Olanzapine (Dosage: 5 mg every other day and not on weekends)  4. Prozac (Dosage: 20 mg every other day and not on weekends)  5. Wellbutrin (Dosage: 450 mg qd)  Conditions For Discharge Achievement of treatment goals and objectives  The patient approved this treatment plan and is making progress towards goals.  Clemente Dewey G Lakeyta Vandenheuvel, LCSW

## 2023-02-22 LAB — SURGICAL PATHOLOGY

## 2023-02-25 ENCOUNTER — Telehealth: Payer: Self-pay

## 2023-02-25 NOTE — Telephone Encounter (Signed)
[name] called the office stating she received the pathology report in [place]. She would like Dr. Ernestina Patches to call and review it with her.  Advised pt Dr. Ernestina Patches is out of the office today but results would be discussed at her upcoming appointment on 3/25. She states she would rather not wait that long. Aware I will notify Dr. Ernestina Patches of the call.

## 2023-02-26 ENCOUNTER — Telehealth: Payer: Self-pay | Admitting: Psychiatry

## 2023-02-26 NOTE — Telephone Encounter (Signed)
Called pt with path results.

## 2023-02-27 ENCOUNTER — Other Ambulatory Visit: Payer: Self-pay | Admitting: Family Medicine

## 2023-03-03 NOTE — Progress Notes (Unsigned)
Gynecologic Oncology Return Clinic Visit  Date of Service: 03/04/2023 Referring Provider: Clovia Cuff, MD   Assessment & Plan: [name] [name] is a 67 y.o. woman with vulvar melanoma in situ who is 3 weeks s/p wide local excision of the vulva and removal of clitoris on 02/12/23.  Postop: - Pt recovering well from surgery and healing appropriately postoperatively - Intraoperative findings and pathology results reviewed. - Ongoing postoperative expectations and precautions reviewed. Continue with no lifting >10lbs through 6 weeks postoperatively - Pt works ***. Okay to return to work at [employer]  Melanoma in situ: - Pathology results reviewed in detail. - Focal positive deep margin. All other margins negative for MIS but positive for atypical lentiginous melanocytic hyperplasia (possible precursor lesion). - In Stage 0 cases, positive margins for precursor lesions appears to have no impact on local recurrence Tomasa Rand, [name] Vallarie Mare, [name]: Margin status in  vulvovaginal melanoma: Management and oncologic outcomes of 50 cases.Gynecol Oncol Rep. 2023 Oct; [id number]). - Recommend ***initial evaluation with pelvic exam q42mo with possible scouting biopsies. If skin changes, could consider treatment with aldara.  Follow-up 1 month Q3 month follow-up Biopsies Aldara if needed  ***   RTC ***.  Bernadene Bell, MD Gynecologic Oncology   Medical Decision Making I personally spent  TOTAL *** minutes face-to-face and non-face-to-face in the care of this patient, which includes all pre, intra, and post visit time on the date of service.  *** minutes spent reviewing records prior to the visit *** Minutes in patient contact      *** minutes in other billable services *** minutes charting , conferring with consultants etc.   ----------------------- Reason for Visit: Postop***  Treatment History: Oncology History   No history exists.    Interval History: Pt reports that  she is recovering well from surgery. She is using tylenol as needed for pain. She is eating and drinking well. She is voiding without issue and having regular bowel movements.   Spotting on pad ***  Past Medical/Surgical History: Past Medical History:  Diagnosis Date   Actinic keratosis 03/29/2021   R central upper lip   Allergic rhinitis    Atypical mole 12/26/2021   spinal upper back, exc 01/15/22   Basal cell carcinoma 10/16/2010   R lower pretibia    Bipolar 1 disorder ([facility])    Fissure, anal    GERD (gastroesophageal reflux disease)    History of basal cell carcinoma excision    ON FACE   History of squamous cell carcinoma excision    ON RIGHT LEG   Hyperlipidemia    Hypothyroidism    IBS (irritable bowel syndrome)    Liver cyst    Malignant melanoma in situ ([place]) 11/20/2022   Right labia minora. Excision 12/25/2022 at anterior vulva, left of clitoral hood, margins involved. Surgery scheduled for 02/12/2023.   Mallet finger of left hand    small finger   Pre-diabetes     Past Surgical History:  Procedure Laterality Date   CARDIAC CATHETERIZATION  04/10/1999   per pt normal   CARDIOVASCULAR STRESS TEST  02-23-2011  dr Chrissie Noa [facility] clinic)   normal nuclear study/  no ischemia/  ef 64%   CATARACT EXTRACTION W/ INTRAOCULAR LENS  IMPLANT, BILATERAL  12/10/2001   Bil   CESAREAN SECTION  12/10/1982   CLOSED REDUCTION FINGER WITH PERCUTANEOUS PINNING Left 04/22/2014   Procedure: LEFT SMALL FINGER CLOSED REDUCTION PINNING;  Surgeon: Linna Hoff, MD;  [facility]: [facility];  Service: Orthopedics;  Laterality: Left;   COLONOSCOPY  11/09/2002   DILATION AND CURETTAGE OF UTERUS  1977   LAPAROSCOPIC CHOLECYSTECTOMY  03/18/2000   RECONSTRUCTION OF EYELID Bilateral 08/2022   TRANSTHORACIC ECHOCARDIOGRAM  02/23/2011   normal lvf/  ef 58%/  mild rve/   mild lae/  mild  mr  & tr/  mild pulmonary htn   VULVECTOMY N/A 12/25/2022   Procedure: PARTIAL   VULVECTOMY;  Surgeon: Bernadene Bell, MD;  Location: [facility];  Service: Gynecology;  Laterality: N/A;   VULVECTOMY N/A 02/12/2023   Procedure: WIDE LOCAL EXCISION OF VULVA AND REMOVAL OF CLITORIS;  Surgeon: Bernadene Bell, MD;  Location: [facility];  Service: Gynecology;  Laterality: N/A;   WISDOM TOOTH EXTRACTION      Family History  Problem Relation Age of Onset   Stroke Mother    Heart disease Mother    Hypertension Father    Diabetes Father    Hyperlipidemia Father    Diabetes Brother    Colon cancer Neg Hx    Breast cancer Neg Hx    Ovarian cancer Neg Hx    Endometrial cancer Neg Hx    Pancreatic cancer Neg Hx    Prostate cancer Neg Hx     Social History   Socioeconomic History   Marital status: Married    Spouse name: Not on file   Number of children: 1   Years of education: Not on file   Highest education level: Not on file  Occupational History   Occupation: Home    Employer: RETIRED  Tobacco Use   Smoking status: Never   Smokeless tobacco: Never  Vaping Use   Vaping Use: Never used  Substance and Sexual Activity   Alcohol use: Yes    Alcohol/week: 0.0 standard drinks of alcohol    Comment: wine- rarely   Drug use: No   Sexual activity: Not Currently    Birth control/protection: Post-menopausal  Other Topics Concern   Not on file  Social History Narrative   Moved here from [place]   Social Determinants of Health   Financial Resource Strain: Low Risk  (05/25/2022)   Overall Financial Resource Strain (CARDIA)    Difficulty of Paying Living Expenses: Not hard at all  Food Insecurity: No Food Insecurity (05/25/2022)   Hunger Vital Sign    Worried About Running Out of Food in the Last Year: Never true    Ran Out of Food in the Last Year: Never true  Transportation Needs: No Transportation Needs (05/25/2022)   PRAPARE - Hydrologist (Medical): No    Lack of Transportation (Non-Medical): No   Physical Activity: Sufficiently Active (05/25/2022)   Exercise Vital Sign    Days of Exercise per Week: 4 days    Minutes of Exercise per Session: 60 min  Stress: No Stress Concern Present (05/25/2022)   [place]    Feeling of Stress : Not at all  Social Connections: Moderately Integrated (05/25/2022)   Social Connection and Isolation Panel [NHANES]    Frequency of Communication with Friends and Family: More than three times a week    Frequency of Social Gatherings with Friends and Family: Once a week    Attends Religious Services: More than 4 times per year    Active Member of [employer] or Organizations: No    Attends Archivist Meetings: Never  Marital Status: Married    Current Medications:  Current Outpatient Medications:    aspirin EC 81 MG tablet, Take 81 mg by mouth daily. Swallow whole., Disp: , Rfl:    BIOTIN PO, Take 1 tablet by mouth daily., Disp: , Rfl:    buPROPion (WELLBUTRIN XL) 150 MG 24 hr tablet, Take 300 mg by mouth daily. Take three tablets by mouth every morning, Disp: , Rfl:    cetirizine (ZYRTEC ALLERGY) 10 MG tablet, Take 10 mg by mouth daily as needed for allergies., Disp: , Rfl:    Cholecalciferol (VITAMIN D) 2000 UNITS CAPS, Take 1 capsule by mouth daily., Disp: , Rfl:    clonazePAM (KLONOPIN) 1 MG tablet, Take 1 mg by mouth at bedtime., Disp: , Rfl:    Deutetrabenazine (AUSTEDO XR PATIENT TITRATION) 6 & 12 & 24 MG TEPK, Take by mouth., Disp: , Rfl:    famotidine (PEPCID) 20 MG tablet, Take 1 tablet (20 mg total) by mouth 2 (two) times daily., Disp: 180 tablet, Rfl: 1   hydrocortisone (ANUSOL-HC) 2.5 % rectal cream, Apply 1 application. topically 2 (two) times daily. To affected area twice daily (around anus/ hemorrhoid), Disp: 30 g, Rfl: 0   lamoTRIgine (LAMICTAL) 100 MG tablet, Take 150 mg by mouth daily., Disp: , Rfl:    levothyroxine (SYNTHROID) 50 MCG tablet, TAKE 1 TABLET EVERY  DAY BEFORE BREAKFAST, Disp: 90 tablet, Rfl: 1   lovastatin (MEVACOR) 40 MG tablet, TAKE 1 TABLET EVERY DAY, Disp: 90 tablet, Rfl: 1   metroNIDAZOLE (METROGEL) 1 % gel, Apply topically daily. To nose, cheeks for rosacea, Disp: 45 g, Rfl: 2   nystatin (MYCOSTATIN/NYSTOP) powder, Apply 1 Application topically 3 (three) times daily. Under abdomen skin fold, Disp: 15 g, Rfl: 2   Probiotic Product (PROBIOTIC ADVANCED PO), Take by mouth., Disp: , Rfl:    senna-docusate (SENOKOT-S) 8.6-50 MG tablet, Take 2 tablets by mouth at bedtime. For AFTER surgery, do not take if having diarrhea, Disp: 30 tablet, Rfl: 0   tiZANidine (ZANAFLEX) 4 MG tablet, Take 1 tablet by mouth at bedtime as needed., Disp: , Rfl: 0   traMADol (ULTRAM) 50 MG tablet, Take 1 tablet (50 mg total) by mouth every 6 (six) hours as needed for severe pain. For AFTER surgery only, do not take and drive, Disp: 10 tablet, Rfl: 0   zolpidem (AMBIEN) 10 MG tablet, Take 10 mg by mouth at bedtime., Disp: , Rfl:   Review of Symptoms: Complete 10-system review is positive for: Anxiety, depression, vaginal bleeding  Physical Exam: There were no vitals taken for this visit. General: ***Alert, oriented, no acute distress. HEENT: ***Normocephalic, atraumatic. Neck symmetric without masses. Sclera anicteric.  Chest: ***Normal work of breathing. ***Clear to auscultation bilaterally.   Cardiovascular: ***Regular rate and rhythm, no murmurs. Abdomen: ***Soft, nontender.  Normoactive bowel sounds.  No masses or hepatosplenomegaly appreciated.  ***Well-healing incision***s. Extremities: ***Grossly normal range of motion.  Warm, well perfused.  No edema bilaterally. Skin: ***No rashes or lesions noted. GU: External genitalia with evidence of recent anterior vulvectomy, stitches in place. Small separation at inferior aspect but overall healing well. Single digit exam confirms the above findings. Exam chaperoned by Alfredo Martinez, NT    Laboratory &  Radiologic Studies: FINAL MICROSCOPIC DIAGNOSIS:  A.   VULVA, ANTERIOR MARGIN, BIOPSY: NO MELANOMA IN SITU SEEN. COMMENT: Dr. Nira Retort is in agreement.  B.   VULVA, LEFT ANTERIOR MARGIN, BIOPSY: NO MELANOMA IN SITU SEEN. COMMENT: Dr. Nira Retort is in agreement.  C.  VULVA, LEFT MEDIAL MARGIN, BIOPSY: NO MELANOMA IN SITU SEEN. COMMENT: MelanA was used to evaluate this slide. Dr. Nira Retort is in agreement.  D.   *****PRELIMINARY DIAGNOSIS***** VULVA, ANTERIOR WITH LEFT/RIGHT LABIA MINORA, EXCISION: -RESIDUAL MELANOMA IN SITU , MULTFOCAL RESIDUAL FOCI , INVOLVING BLACK/DEEP MARGIN (D2),  -BACKGROUND ATYPICAL JUNCTIONAL MELANOCYTIC HYPERPLASIA INVOLVING MULTIFOCAL MARGINS (ANTERIOR, OUTER LEFT, INNER LEFT MARGINS INVOLVED WITH ATYPICAL JUNCTIONAL MELANOCYTIC HYPERPLASIA, SEE COMMENT)  COMMENT: Sections show extensive areas of atypical lentiginous melanocytic hyperplasia involving the margins noted above, which may be a precursor lesion to the residual melanoma in situ present. The atypical lentiginous melanocytic hyperplasia does not meet criteria for melanoma in situ. These atypical lentiginous foci are negative with PRAME, and exhibit lesser cytological atypia on MITF staining. MelanA overstains many foci and is not contributory.  However, residual melanoma in situ with focal upward (pagetoid) growth, severe atypia of melanocytes, and melanocytic confluence is seen in multifocal areas, but notably ONLY at the margin deep inked black (block D2). The remainder of the margins are free of melanoma in situ.  E.   VULVA, LEFT LATERAL MARGIN, EXCISION: ATYPICAL JUNCTIONAL MELANOCYTIC HYPERPLASIA, NO MELANOMA IN SITU  COMMENT: No melanoma in situ is seen. PRAME, MelanA, and MITF were used to evaluate this margin.  Dr. Nira Retort has reviewed this case and is in agreement with the diagnoses rendered above.  In managing this patient with extensive atypical lentiginous melanocytic hyperplasia, which  likely represents a confluence of extensive melanotic macules and precursor disease to melanoma in situ, this reference article is provided: Ross Ludwig. May P. Vallarie Mare, Shitanshu Uppai: Margin status in vulvovaginal melanoma: Management and oncologic outcomes of 50 cases. Gynecol Oncol Rep. 2023 Oct; 49: [id number]

## 2023-03-04 ENCOUNTER — Other Ambulatory Visit: Payer: Self-pay | Admitting: Oncology

## 2023-03-04 ENCOUNTER — Inpatient Hospital Stay: Payer: Medicare HMO | Attending: Gynecologic Oncology | Admitting: Psychiatry

## 2023-03-04 VITALS — BP 143/70 | HR 66 | Temp 98.6°F | Resp 16 | Ht 60.0 in | Wt 220.0 lb

## 2023-03-04 DIAGNOSIS — Z9079 Acquired absence of other genital organ(s): Secondary | ICD-10-CM | POA: Diagnosis not present

## 2023-03-04 DIAGNOSIS — D038 Melanoma in situ of other sites: Secondary | ICD-10-CM | POA: Diagnosis not present

## 2023-03-04 DIAGNOSIS — Z7189 Other specified counseling: Secondary | ICD-10-CM

## 2023-03-04 NOTE — Progress Notes (Signed)
Gynecologic Oncology Multi-Disciplinary Disposition Conference Note  Date of the Conference: 03/04/2023  Patient Name: [name] [name]  Referring Provider: Dr. Hulan Fray Primary GYN Oncologist: Dr. Ernestina Patches   Stage/Disposition:  Stage 0 vulvar melanoma in situ. Disposition is to observation with every 3 month biopsies. Consideration for Aldara in the future.   This Multidisciplinary conference took place involving physicians from [place], Medical Oncology, Radiation Oncology, Pathology, Radiology along with the Gynecologic Oncology Nurse Practitioner and Gynecologic Oncology Nurse Navigator.  Comprehensive assessment of the patient's malignancy, staging, need for surgery, chemotherapy, radiation therapy, and need for further testing were reviewed. Supportive measures, both inpatient and following discharge were also discussed. The recommended plan of care is documented. Greater than 35 minutes were spent correlating and coordinating this patient's care.

## 2023-03-04 NOTE — Patient Instructions (Signed)
It was a pleasure to see you in clinic today. - You are healing well. - Okay to return to upper body exercises - Return visit planned for 1 month  Thank you very much for allowing me to provide care for you today.  I appreciate your confidence in choosing our Gynecologic Oncology team at [facility].  If you have any questions about your visit today please call our office or send [place] a MyChart message and we will get back to you as soon as possible.

## 2023-03-06 ENCOUNTER — Encounter: Payer: Self-pay | Admitting: Psychiatry

## 2023-03-28 DIAGNOSIS — D039 Melanoma in situ, unspecified: Secondary | ICD-10-CM | POA: Diagnosis not present

## 2023-03-29 ENCOUNTER — Ambulatory Visit (INDEPENDENT_AMBULATORY_CARE_PROVIDER_SITE_OTHER): Payer: Medicare HMO | Admitting: Psychology

## 2023-03-29 DIAGNOSIS — F3181 Bipolar II disorder: Secondary | ICD-10-CM | POA: Diagnosis not present

## 2023-03-29 NOTE — Progress Notes (Signed)
[facility] Counselor/Therapist Progress Note  Patient ID: [name] [name], MRN: [id number],    Date: 03/29/2023  Time Spent: 60 minutes  Treatment Type: Individual Therapy  Reported Symptoms: none  Mental Status Exam: Appearance:  Casual     Behavior: Appropriate  Motor: Normal  Speech/Language:  normal  Affect: Appropriate  Mood: pleasant  Thought process: normal  Thought content:   WNL  Sensory/Perceptual disturbances:   WNL  Orientation: oriented to person, place, time/date, and situation  Attention: Good  Concentration: Good  Memory: WNL  Fund of knowledge:  Good  Insight:   Good  Judgment:  Good  Impulse Control: Good   Risk Assessment: Danger to Self:  No Self-injurious Behavior: No Danger to Others: No Duty to Warn:no Physical Aggression / Violence:No  Access to Firearms a concern: No  Gang Involvement:No   Subjective: The patient attended a face-to-face individual therapy session via video visit today.  The patient agreed and consented for this session to be on caregility.  The patient was in her home alone and the therapist was in the office.  The patient presents as pleasant and cooperative.  The patient reports that she feels much better since her surgery.  She states that her doctor said that she needed to come back in 3 months for a postop visit.  He did state that she chose to go to do for a second opinion and he basically said that the lady who did the surgery did exactly what he would have done and this made the patient feel much better.  The patient was a little anxious about how to deal with that situation with her doctor and I explained to her that I thought that it would be fine for her to explain to the doctor that she was a little anxious and just felt like she needed the validation that things were going in the right direction.  We practiced how to convey that message.  The patient reports that she still feels a little anxious  about it but I think she has a better direction about how to handle the situation with her doctor.  The patient reports she seems to be doing well and is pretty stable at this point with her emotional health as well. Interventions: Cognitive Behavioral Therapy  Diagnosis:Bipolar II disorder  Plan: Client Abilities/Strengths  intelligent, insightful  Client Treatment Preferences  Outpatient individual therapy  Client Statement of Needs  "I need someone to help monitor and manage my bipolar"  Treatment Level  Outpatient individual therapy  Symptoms  Depressed or irritable mood.: (Status: improved). Diminished interest in or  enjoyment of activities.: (Status: improved). Displays a poor attention span and is easily distracted.:  (Status: improved). Exhibits an abnormally and  persistently elevated, expansive, or irritable mood with at least three symptoms of mania (i.e., inflated  self-esteem or grandiosity, decreased need for sleep, pressured speech, flight of ideas, distractibility,  excessive goal-directed activity or psychomotor agitation, excessive involvement in pleasurable, highrisk behavior).:  (Status: improved). Feelings of hopelessness, worthlessness, or inappropriate guilt.: (Status: improved). History of at least one hypomanic,  manic, or mixed mood episode.:  (Status: maintained). History of chronic or  recurrent depression for which the client has taken antidepressant medication, been hospitalized, had  outpatient treatment, or had a course of electroconvulsive therapy.:  (Status:  maintained). Lack of energy.: (Status: improved). Lacks follow-through in  projects, even though energy is very high, since behavior lacks discipline and goal-directedness.:  (  Status: improved). Poor concentration and indecisiveness.:  (Status: improved). Reports flight of ideas or thoughts racing.: (Status: improved). Shows evidence of a decreased need for sleep.: (Status: improved).  Sleeplessness  or hypersomnia.:  (Status: improved). Social withdrawal.: (Status: improved). Suicidal thoughts and/or gestures.:   (Status: improved). The elevated mood or irritability (mania) causes marked impairment in  occupational functioning, social activities, or relationships with others.: (Status: improved). Verbalizes grandiose ideas and/or persecutory beliefs.:  (Status: improved).  Problems Addressed  Bipolar Disorder - Mania, Bipolar Disorder - Depression, Bipolar Disorder - Mania, Bipolar Disorder -  Mania, Bipolar Disorder - Depression, Bipolar Disorder - Depression, Bipolar Disorder - Mania  Goals 1. Achieve controlled behavior, moderated mood, more deliberative  speech and thought process, and a stable daily activity pattern. 2. Achieve controlled behavior, moderated mood, more deliberative  speech and thought process, and a stable daily activity pattern. 3. Alleviate manic/hypomanic mood and return to previous level of  effective functioning. 4. Develop healthy cognitive patterns and beliefs about self and the world  that lead to alleviation and help prevent the relapse of  manic/hypomanic episodes. Objective Maintain a pattern of regular rhythm to daily activities. Target Date: 2024-05-07 Frequency: Monthly Progress: 80 Modality: individual Related Interventions 1. Assist the client in establishing a more routine pattern of daily activities such as sleeping,  eating, solitary and social activities, and exercise; use and review a form to schedule, assess,  and modify these activities so that they occur in a predictable rhythm every day. Objective Identify and replace thoughts and behaviors that trigger manic or depressive symptoms. Target Date: 2024-05-07 Frequency: Monthly Progress: 80 Modality: individual Related Interventions 1. Use cognitive therapy techniques to explore and educate the client about cognitive biases that  trigger his/her elevated or depressive mood (see  Cognitive Therapy for Bipolar Disorder by  Aggie Hacker al.). 2. Teach the client cognitive-behavioral coping and relapse prevention skills including delaying  impulsive actions, structured scheduling of daily activities, keeping a regular sleep routine,  avoiding unrealistic goal striving, using relaxation procedures, identifying and avoiding episode triggers such as stimulant drug use, alcohol consumption, breaking sleep routine, or exposing  self to high stress (see Cognitive Therapy for Bipolar Disorder by Aggie Hacker al.). 3. Use cognitive therapy techniques to explore and educate the client's about cognitive biases that  trigger his/her elevated or depressive mood (see Cognitive Therapy for Bipolar Disorder by  Aggie Hacker al.). Objective Discuss and resolve troubling personal and interpersonal issues. Target Date: 2024-05-07 Frequency: Monthly Progress: 60 Modality: individual Objective Participate in periodic "maintenance" sessions. Target Date: 2024-05-07 Frequency: Monthly Progress: 80 Modality: individual Related Interventions 1. Hold periodic "maintenance" sessions within the first few months after therapy to facilitate the  client's positive changes; problem-solve obstacles to improvement. 2. Hold periodic "maintenance" sessions within the first few months after therapy to facilitate the  client's positive changes; problem-solve obstacles to improvement. Objective Describe mood state, energy level, amount of control over thoughts, and sleeping pattern. Target Date: 2024-05-07 Frequency: Monthly Progress: 80 Modality: individual Related Interventions 1. Encourage the client to share his/her thoughts and feelings; express empathy and build rapport  while assessing primary cognitive, behavioral, interpersonal, or other symptoms of the mood  disorder. 2. Assess presence, severity, and impact of past and present mood episodes on social,  occupational, and interpersonal functioning; supplement  with semi-structured inventory, if  desired (e.g., Montgomery-Asberg Depression Rating Scale, Inventory to Diagnose  Depression). 3. Encourage the client to share his/her thoughts and feelings; express empathy, and  build rapport  while assessing primary cognitive, behavioral, interpersonal, or other symptoms of the mood  disorder. 4. Assess presence, severity, and impact of past and present mood episodes including mania (i.e.,  pressured speech, impulsive behavior, euphoric mood, flight of ideas, reduced need for sleep,  inflated self-esteem, and high energy) on social, occupational, and interpersonal functioning;  supplement with semi-structured inventory, if desired (e.g., Young Mania Rating Scale; the  Clinical Monitoring Form); re-administer as indicated to assess treatment response. 5. Develop healthy cognitive patterns and beliefs about self and the world  that lead to alleviation and help prevent the relapse of mood  episodes. 6. Normalize energy level and return to usual activities, good judgment,  stable mood, more realistic expectations, and goal-directed behavior. 7. Normalize energy level and return to usual activities, good judgment,  stable mood, more realistic expectations, and goal-directed behavior. Diagnosis 296.52 (Bipolar I disorder, most recent episode (or current) depressed, moderate)  Medications  1. Ambien (Dosage: 10mg  qhs)  2. Klonopin (Dosage: 1mg  TID)  3. Olanzapine (Dosage: 5 mg every other day and not on weekends)  4. Prozac (Dosage: 20 mg every other day and not on weekends)  5. Wellbutrin (Dosage: 450 mg qd)  Conditions For Discharge Achievement of treatment goals and objectives  The patient approved this treatment plan and is making progress towards goals.  Leamon Palau G Katarina Riebe, LCSW

## 2023-04-01 ENCOUNTER — Inpatient Hospital Stay: Payer: Medicare HMO | Attending: Gynecologic Oncology | Admitting: Psychiatry

## 2023-04-01 VITALS — BP 163/73 | HR 64 | Temp 98.7°F | Wt 227.6 lb

## 2023-04-01 DIAGNOSIS — C519 Malignant neoplasm of vulva, unspecified: Secondary | ICD-10-CM

## 2023-04-01 DIAGNOSIS — Z9079 Acquired absence of other genital organ(s): Secondary | ICD-10-CM

## 2023-04-01 DIAGNOSIS — D038 Melanoma in situ of other sites: Secondary | ICD-10-CM

## 2023-04-01 NOTE — Progress Notes (Unsigned)
Gynecologic Oncology Return Clinic Visit  Date of Service: 04/01/2023 Referring Provider: Nicholaus Bloom, MD   Assessment & Plan: [name] [name] is a 67 y.o. woman with vulvar melanoma in situ who is s/p wide local excision of the vulva and removal of clitoris on 02/12/23.  Postop: - Superficial partial separation is now well healed by secondary intention. - silver nitrate applied to granulation tissue.  - restrictions lifted.   Melanoma in situ: - Focal positive deep margin (one focal spot on 1 single slide). All other margins negative for MIS but positive for atypical lentiginous melanocytic hyperplasia (possible precursor lesion). - In Stage 0 cases, positive margins for precursor lesions appears to have no impact on local recurrence [name], [name] [name], [name]: Margin status in  vulvovaginal melanoma: Management and oncologic outcomes of 50 cases.Gynecol Oncol Rep. 2023 Oct; 49: [id number]). - Plan pelvic exam q60mo with possible scouting biopsies. If skin changes, could consider treatment with aldara.  RTC 2 month.  [name], MD Gynecologic Oncology   Medical Decision Making I personally spent  TOTAL 18 minutes face-to-face and non-face-to-face in the care of this patient, which includes all pre, intra, and post visit time on the date of service.   ----------------------- Reason for Visit: Postop/Treatment counseling  Treatment History: Pt presented to her Ob/Gyn for evaluation of vulvar itching. She had noted itching the week prior, but this resolved with application of clobetasol which she had for a diagnosis of lichen simplex chronicus. However, patient had also been previously noted by another provider to have a brown vulvar lesions of her right labia minora 12/2021 which was rechecked 03/2022 and noted to be "stable" with plan for continued yearly follow-up.   On 11/12/22, her Ob/Gyn noted a 1cm brown lesion was noted "at the top of her labia minora." Given  the appearance of a melanotic lesion, she was recommended to return for a biopsy, which was completed on 11/20/22. Biopsy returned with melanoma in situ, edges involved. No invasive disease was seen.   She underwent a simple partial vulvectomy on 12/25/22 with path results: A. ANTERIOR VULVA, EXCISION: MELANOMA IN SITU, EXTENDING TO THE DEEP  SURGICAL MARGIN. THE PERIPHERAL MARGINS ARE NEGATIVE.  B. ANTERIOR VULVA, LEFT OF CLITORAL HOOD, EXCISION:  MELANOMA IN SITU  INVOLVING THE 12, 3, 6 AND 9 O'CLOCK MARGINS.   Given multiple positive margins, she returned to the OR on 02/12/23 for additional resection including a WLE of the anterior vulva including removal of remaining bilateral labia minora and removal of clitoris with pathology showing: A.   VULVA, ANTERIOR MARGIN, BIOPSY: NO MELANOMA IN SITU SEEN. COMMENT: Dr. Maryelizabeth Kaufmann is in agreement. B.   VULVA, LEFT ANTERIOR MARGIN, BIOPSY: NO MELANOMA IN SITU SEEN. COMMENT: Dr. Maryelizabeth Kaufmann is in agreement. C.   VULVA, LEFT MEDIAL MARGIN, BIOPSY: NO MELANOMA IN SITU SEEN. COMMENT: MelanA was used to evaluate this slide. Dr. Maryelizabeth Kaufmann is in agreement D.   **PRELIMINARY DIAGNOSIS** VULVA, ANTERIOR WITH LEFT/RIGHT LABIA MINORA, EXCISION: -RESIDUAL MELANOMA IN SITU , MULTFOCAL RESIDUAL FOCI , INVOLVING BLACK/DEEP MARGIN (D2), -BACKGROUND ATYPICAL JUNCTIONAL MELANOCYTIC HYPERPLASIA INVOLVING MULTIFOCAL MARGINS (ANTERIOR, OUTER LEFT, INNER LEFT MARGINS INVOLVED WITH ATYPICAL JUNCTIONAL MELANOCYTIC HYPERPLASIA, SEE COMMENT) COMMENT: Sections show extensive areas of atypical lentiginous melanocytic hyperplasia involving the margins noted above, which may be a precursor lesion to the residual melanoma in situ present. The atypical lentiginous melanocytic hyperplasia does not meet criteria for melanoma in situ. These atypical lentiginous foci are negative with PRAME, and  exhibit lesser cytological atypia on MITF staining. MelanA overstains many foci and is not  contributory. However, residual melanoma in situ with focal upward (pagetoid) growth, severe atypia of melanocytes, and melanocytic confluence is seen in multifocal areas, but notably ONLY at the margin deep inked black (block D2). The remainder of the margins are free of melanoma in situ.  E.   VULVA, LEFT LATERAL MARGIN, EXCISION: ATYPICAL JUNCTIONAL MELANOCYTIC HYPERPLASIA, NO MELANOMA IN SITU COMMENT: No melanoma in situ is seen. PRAME, MelanA, and MITF were used to evaluate this margin. Dr. Maryelizabeth Kaufmann has reviewed this case and is in agreement with the diagnoses rendered above. In managing this patient with extensive atypical lentiginous melanocytic hyperplasia, which likely represents a confluence of extensive melanotic macules and precursor disease to melanoma in situ, this reference article is provided: Charlcie Cradle. May P. [name], Shitanshu Uppai: Margin status in vulvovaginal melanoma: Management and oncologic outcomes of 50 cases. Gynecol Oncol Rep. 2023 Oct; 49: [id number]    Interval History: Pt presents today with her husband. She reports that she is doing well. She has returned to her prior cardio routine. Felt that it went well. Feels that her vulva is healing well. No concerns.   Past Medical/Surgical History: Past Medical History:  Diagnosis Date   Actinic keratosis 03/29/2021   R central upper lip   Allergic rhinitis    Atypical mole 12/26/2021   spinal upper back, exc 01/15/22   Basal cell carcinoma 10/16/2010   R lower pretibia    Bipolar 1 disorder (HCC)    Fissure, anal    GERD (gastroesophageal reflux disease)    History of basal cell carcinoma excision    ON FACE   History of squamous cell carcinoma excision    ON RIGHT LEG   Hyperlipidemia    Hypothyroidism    IBS (irritable bowel syndrome)    Liver cyst    Malignant melanoma in situ (HCC) 11/20/2022   Right labia minora. Excision 12/25/2022 at anterior vulva, left of clitoral hood, margins involved.  Surgery scheduled for 02/12/2023.   Mallet finger of left hand    small finger   Pre-diabetes     Past Surgical History:  Procedure Laterality Date   CARDIAC CATHETERIZATION  04/10/1999   per pt normal   CARDIOVASCULAR STRESS TEST  02-23-2011  dr Iantha Fallen [facility] clinic)   normal nuclear study/  no ischemia/  ef 64%   CATARACT EXTRACTION W/ INTRAOCULAR LENS  IMPLANT, BILATERAL  12/10/2001   Bil   CESAREAN SECTION  12/10/1982   CLOSED REDUCTION FINGER WITH PERCUTANEOUS PINNING Left 04/22/2014   Procedure: LEFT SMALL FINGER CLOSED REDUCTION PINNING;  Surgeon: Sharma Covert, MD;  Location: [facility];  Service: Orthopedics;  Laterality: Left;   COLONOSCOPY  11/09/2002   DILATION AND CURETTAGE OF UTERUS  1977   LAPAROSCOPIC CHOLECYSTECTOMY  03/18/2000   RECONSTRUCTION OF EYELID Bilateral 08/2022   TRANSTHORACIC ECHOCARDIOGRAM  02/23/2011   normal lvf/  ef 58%/  mild rve/   mild lae/  mild  mr  & tr/  mild pulmonary htn   VULVECTOMY N/A 12/25/2022   Procedure: PARTIAL  VULVECTOMY;  Surgeon: [name], MD;  Location: [facility];  Service: Gynecology;  Laterality: N/A;   VULVECTOMY N/A 02/12/2023   Procedure: WIDE LOCAL EXCISION OF VULVA AND REMOVAL OF CLITORIS;  Surgeon: [name], MD;  Location: [facility] [place];  Service: Gynecology;  Laterality: N/A;   WISDOM TOOTH EXTRACTION  Family History  Problem Relation Age of Onset   Stroke Mother    Heart disease Mother    Hypertension Father    Diabetes Father    Hyperlipidemia Father    Diabetes Brother    Colon cancer Neg Hx    Breast cancer Neg Hx    Ovarian cancer Neg Hx    Endometrial cancer Neg Hx    Pancreatic cancer Neg Hx    Prostate cancer Neg Hx     Social History   Socioeconomic History   Marital status: Married    Spouse name: Not on file   Number of children: 1   Years of education: Not on file   Highest education level: Not on file  Occupational  History   Occupation: Home    Employer: RETIRED  Tobacco Use   Smoking status: Never   Smokeless tobacco: Never  Vaping Use   Vaping Use: Never used  Substance and Sexual Activity   Alcohol use: Yes    Alcohol/week: 0.0 standard drinks of alcohol    Comment: wine- rarely   Drug use: No   Sexual activity: Not Currently    Birth control/protection: Post-menopausal  Other Topics Concern   Not on file  Social History Narrative   Moved here from [place]   Social Determinants of Health   Financial Resource Strain: Low Risk  (05/25/2022)   Overall Financial Resource Strain (CARDIA)    Difficulty of Paying Living Expenses: Not hard at all  Food Insecurity: No Food Insecurity (05/25/2022)   Hunger Vital Sign    Worried About Running Out of Food in the Last Year: Never true    Ran Out of Food in the Last Year: Never true  Transportation Needs: No Transportation Needs (05/25/2022)   PRAPARE - Administrator, Civil Service (Medical): No    Lack of Transportation (Non-Medical): No  Physical Activity: Sufficiently Active (05/25/2022)   Exercise Vital Sign    Days of Exercise per Week: 4 days    Minutes of Exercise per Session: 60 min  Stress: No Stress Concern Present (05/25/2022)   [employer] of Occupational Health - Occupational Stress Questionnaire    Feeling of Stress : Not at all  Social Connections: Moderately Integrated (05/25/2022)   Social Connection and Isolation Panel [NHANES]    Frequency of Communication with Friends and Family: More than three times a week    Frequency of Social Gatherings with Friends and Family: Once a week    Attends Religious Services: More than 4 times per year    Active Member of [employer] or Organizations: No    Attends Banker Meetings: Never    Marital Status: Married    Current Medications:  Current Outpatient Medications:    aspirin EC 81 MG tablet, Take 81 mg by mouth daily. Swallow whole., Disp: , Rfl:    BIOTIN PO,  Take 1 tablet by mouth daily., Disp: , Rfl:    buPROPion (WELLBUTRIN XL) 150 MG 24 hr tablet, Take 300 mg by mouth daily. Take three tablets by mouth every morning, Disp: , Rfl:    cetirizine (ZYRTEC ALLERGY) 10 MG tablet, Take 10 mg by mouth daily as needed for allergies., Disp: , Rfl:    Cholecalciferol (VITAMIN D) 2000 UNITS CAPS, Take 1 capsule by mouth daily., Disp: , Rfl:    clonazePAM (KLONOPIN) 1 MG tablet, Take 1 mg by mouth at bedtime., Disp: , Rfl:    Deutetrabenazine (AUSTEDO XR PATIENT TITRATION) 6 &  12 & 24 MG TEPK, Take by mouth., Disp: , Rfl:    famotidine (PEPCID) 20 MG tablet, Take 1 tablet (20 mg total) by mouth 2 (two) times daily., Disp: 180 tablet, Rfl: 1   hydrocortisone (ANUSOL-HC) 2.5 % rectal cream, Apply 1 application. topically 2 (two) times daily. To affected area twice daily (around anus/ hemorrhoid), Disp: 30 g, Rfl: 0   lamoTRIgine (LAMICTAL) 100 MG tablet, Take 150 mg by mouth daily., Disp: , Rfl:    levothyroxine (SYNTHROID) 50 MCG tablet, TAKE 1 TABLET EVERY DAY BEFORE BREAKFAST, Disp: 90 tablet, Rfl: 1   lovastatin (MEVACOR) 40 MG tablet, TAKE 1 TABLET EVERY DAY, Disp: 90 tablet, Rfl: 1   metroNIDAZOLE (METROGEL) 1 % gel, Apply topically daily. To nose, cheeks for rosacea, Disp: 45 g, Rfl: 2   nystatin (MYCOSTATIN/NYSTOP) powder, Apply 1 Application topically 3 (three) times daily. Under abdomen skin fold, Disp: 15 g, Rfl: 2   Probiotic Product (PROBIOTIC ADVANCED PO), Take by mouth., Disp: , Rfl:    tiZANidine (ZANAFLEX) 4 MG tablet, Take 1 tablet by mouth at bedtime as needed., Disp: , Rfl: 0   zolpidem (AMBIEN) 10 MG tablet, Take 10 mg by mouth at bedtime., Disp: , Rfl:   Review of Symptoms: Complete 10-system review is positive for: Hearing loss, anxiety  Physical Exam: BP (!) 163/73 (BP Location: Left Arm, Patient Position: Sitting)   Pulse 64   Temp 98.7 F (37.1 C) (Oral)   Wt 227 lb 9.6 oz (103.2 kg)   SpO2 98%   BMI 44.45 kg/m  General: Alert,  oriented, no acute distress. HEENT: Normocephalic, atraumatic. Neck symmetric without masses. Sclera anicteric.  Chest: Normal work of breathing. Clear to auscultation bilaterally.   Cardiovascular: Regular rate and rhythm, no murmurs. Abdomen: Soft, nontender.  Normoactive bowel sounds.   Extremities: Grossly normal range of motion.  Warm, well perfused.   Skin: No rashes or lesions noted. GU: External genitalia with evidence of recent anterior vulvectomy, now well healed in. Small polypoid granulation tissue in the midline, treated with silver nitrate. Exam chaperoned by Hector Shade, NT   Laboratory & Radiologic Studies: None

## 2023-04-01 NOTE — Patient Instructions (Signed)
It was a pleasure to see you in clinic today. - Healed well. - Return visit planned for 2 months.  Thank you very much for allowing me to provide care for you today.  I appreciate your confidence in choosing our Gynecologic Oncology team at [facility].  If you have any questions about your visit today please call our office or send [place] a MyChart message and we will get back to you as soon as possible.

## 2023-04-03 ENCOUNTER — Encounter: Payer: Self-pay | Admitting: Psychiatry

## 2023-04-04 ENCOUNTER — Other Ambulatory Visit: Payer: Self-pay | Admitting: Family Medicine

## 2023-04-17 DIAGNOSIS — H5213 Myopia, bilateral: Secondary | ICD-10-CM | POA: Diagnosis not present

## 2023-04-18 DIAGNOSIS — R079 Chest pain, unspecified: Secondary | ICD-10-CM | POA: Diagnosis not present

## 2023-04-18 DIAGNOSIS — E7800 Pure hypercholesterolemia, unspecified: Secondary | ICD-10-CM | POA: Diagnosis not present

## 2023-04-18 DIAGNOSIS — R0602 Shortness of breath: Secondary | ICD-10-CM | POA: Diagnosis not present

## 2023-04-26 ENCOUNTER — Other Ambulatory Visit: Payer: Self-pay | Admitting: Family Medicine

## 2023-04-26 ENCOUNTER — Telehealth: Payer: Self-pay | Admitting: Family Medicine

## 2023-04-26 MED ORDER — FAMOTIDINE 20 MG PO TABS: 20.0000 mg | ORAL_TABLET | Freq: Two times a day (BID) | ORAL | 0 refills | Status: DC

## 2023-04-26 NOTE — Telephone Encounter (Signed)
Patient called in and stated that her famotidine (PEPCID) 20 MG tablet was refilled at [facility] instead of [facility]. She stated that she hasn't picked it up yet and was wanting to know if it could be sent to [facility]. She also stated that she would like 180 pills to last 90 days. Thank you!

## 2023-04-26 NOTE — Telephone Encounter (Signed)
We didn't send a Rx to [facility]. I have refilled a 90 day Rx to [facility] for pt now

## 2023-05-01 ENCOUNTER — Ambulatory Visit (INDEPENDENT_AMBULATORY_CARE_PROVIDER_SITE_OTHER): Payer: Medicare HMO | Admitting: Psychology

## 2023-05-01 DIAGNOSIS — F3181 Bipolar II disorder: Secondary | ICD-10-CM | POA: Diagnosis not present

## 2023-05-01 NOTE — Progress Notes (Addendum)
[facility] Counselor/Therapist Progress Note  Patient ID: [name] [name], MRN: [id number],    Date: 05/01/2023  Time Spent: 60 minutes  Treatment Type: Individual Therapy  Reported Symptoms: none  Mental Status Exam: Appearance:  Casual     Behavior: Appropriate  Motor: Normal  Speech/Language:  normal  Affect: Appropriate  Mood: pleasant  Thought process: normal  Thought content:   WNL  Sensory/Perceptual disturbances:   WNL  Orientation: oriented to person, place, time/date, and situation  Attention: Good  Concentration: Good  Memory: WNL  Fund of knowledge:  Good  Insight:   Good  Judgment:  Good  Impulse Control: Good   Risk Assessment: Danger to Self:  No Self-injurious Behavior: No Danger to Others: No Duty to Warn:no Physical Aggression / Violence:No  Access to Firearms a concern: No  Gang Involvement:No   Subjective: The patient attended a face-to-face individual therapy session via video visit today.  The patient agreed and consented for this session to be on caregility.  The patient was in her home alone and the therapist was in the office.  The patient presents as pleasant and cooperative.  The patient reports that she is doing much better since her surgery.  She is back to working out at [employer] and reports that she feels good about that.  Today she was talking about being a little concerned about when she goes back and has to have more by to use and we discussed how to look at that a different way.  In addition she has a little anxiety about facing the doctor that she had where she got a second opinion.  I explained to her that that doctor is likely to be okay with that and that it just validates that that doctor is doing the right thing for herself.  The patient understood the concepts discussed and we talked about her working with herself to reframe her thoughts so she does not get stressed out about them.   In addition we talked a little about she and her husband doing a little more planning for their retirement.  Interventions: Cognitive Behavioral Therapy  Diagnosis:Bipolar II disorder (HCC)  Plan: Client Abilities/Strengths  intelligent, insightful  Client Treatment Preferences  Outpatient individual therapy  Client Statement of Needs  "I need someone to help monitor and manage my bipolar"  Treatment Level  Outpatient individual therapy  Symptoms  Depressed or irritable mood.: (Status: improved). Diminished interest in or  enjoyment of activities.: (Status: improved). Displays a poor attention span and is easily distracted.:  (Status: improved). Exhibits an abnormally and  persistently elevated, expansive, or irritable mood with at least three symptoms of mania (i.e., inflated  self-esteem or grandiosity, decreased need for sleep, pressured speech, flight of ideas, distractibility,  excessive goal-directed activity or psychomotor agitation, excessive involvement in pleasurable, highrisk behavior).:  (Status: improved). Feelings of hopelessness, worthlessness, or inappropriate guilt.: (Status: improved). History of at least one hypomanic,  manic, or mixed mood episode.:  (Status: maintained). History of chronic or  recurrent depression for which the client has taken antidepressant medication, been hospitalized, had  outpatient treatment, or had a course of electroconvulsive therapy.:  (Status:  maintained). Lack of energy.: (Status: improved). Lacks follow-through in  projects, even though energy is very high, since behavior lacks discipline and goal-directedness.:  (Status: improved). Poor concentration and indecisiveness.:  (Status: improved). Reports flight  of ideas or thoughts racing.: (Status: improved). Shows evidence of a decreased need for sleep.: (Status: improved).  Sleeplessness or hypersomnia.:  (Status: improved). Social withdrawal.: (Status: improved). Suicidal thoughts  and/or gestures.:   (Status: improved). The elevated mood or irritability (mania) causes marked impairment in  occupational functioning, social activities, or relationships with others.: (Status: improved). Verbalizes grandiose ideas and/or persecutory beliefs.:  (Status: improved).  Problems Addressed  Bipolar Disorder - Mania, Bipolar Disorder - Depression, Bipolar Disorder - Mania, Bipolar Disorder -  Mania, Bipolar Disorder - Depression, Bipolar Disorder - Depression, Bipolar Disorder - Mania  Goals 1. Achieve controlled behavior, moderated mood, more deliberative  speech and thought process, and a stable daily activity pattern. 2. Achieve controlled behavior, moderated mood, more deliberative  speech and thought process, and a stable daily activity pattern. 3. Alleviate manic/hypomanic mood and return to previous level of  effective functioning. 4. Develop healthy cognitive patterns and beliefs about self and the world  that lead to alleviation and help prevent the relapse of  manic/hypomanic episodes. Objective Maintain a pattern of regular rhythm to daily activities. Target Date: 2024-05-07 Frequency: Monthly Progress: 80 Modality: individual Related Interventions 1. Assist the client in establishing a more routine pattern of daily activities such as sleeping,  eating, solitary and social activities, and exercise; use and review a form to schedule, assess,  and modify these activities so that they occur in a predictable rhythm every day. Objective Identify and replace thoughts and behaviors that trigger manic or depressive symptoms. Target Date: 2024-05-07 Frequency: Monthly Progress: 80 Modality: individual Related Interventions 1. Use cognitive therapy techniques to explore and educate the client about cognitive biases that  trigger his/her elevated or depressive mood (see Cognitive Therapy for Bipolar Disorder by  Aggie Hacker al.). 2. Teach the client cognitive-behavioral  coping and relapse prevention skills including delaying  impulsive actions, structured scheduling of daily activities, keeping a regular sleep routine,  avoiding unrealistic goal striving, using relaxation procedures, identifying and avoiding episode triggers such as stimulant drug use, alcohol consumption, breaking sleep routine, or exposing  self to high stress (see Cognitive Therapy for Bipolar Disorder by Aggie Hacker al.). 3. Use cognitive therapy techniques to explore and educate the client's about cognitive biases that  trigger his/her elevated or depressive mood (see Cognitive Therapy for Bipolar Disorder by  Aggie Hacker al.). Objective Discuss and resolve troubling personal and interpersonal issues. Target Date: 2024-05-07 Frequency: Monthly Progress: 60 Modality: individual Objective Participate in periodic "maintenance" sessions. Target Date: 2024-05-07 Frequency: Monthly Progress: 80 Modality: individual Related Interventions 1. Hold periodic "maintenance" sessions within the first few months after therapy to facilitate the  client's positive changes; problem-solve obstacles to improvement. 2. Hold periodic "maintenance" sessions within the first few months after therapy to facilitate the  client's positive changes; problem-solve obstacles to improvement. Objective Describe mood state, energy level, amount of control over thoughts, and sleeping pattern. Target Date: 2024-05-07 Frequency: Monthly Progress: 80 Modality: individual Related Interventions 1. Encourage the client to share his/her thoughts and feelings; express empathy and build rapport  while assessing primary cognitive, behavioral, interpersonal, or other symptoms of the mood  disorder. 2. Assess presence, severity, and impact of past and present mood episodes on social,  occupational, and interpersonal functioning; supplement with semi-structured inventory, if  desired (e.g., Montgomery-Asberg Depression Rating Scale,  Inventory to Diagnose  Depression). 3. Encourage the client to share his/her thoughts and feelings; express empathy, and build rapport  while assessing primary cognitive, behavioral, interpersonal, or other  symptoms of the mood  disorder. 4. Assess presence, severity, and impact of past and present mood episodes including mania (i.e.,  pressured speech, impulsive behavior, euphoric mood, flight of ideas, reduced need for sleep,  inflated self-esteem, and high energy) on social, occupational, and interpersonal functioning;  supplement with semi-structured inventory, if desired (e.g., Young Mania Rating Scale; the  Clinical Monitoring Form); re-administer as indicated to assess treatment response. 5. Develop healthy cognitive patterns and beliefs about self and the world  that lead to alleviation and help prevent the relapse of mood  episodes. 6. Normalize energy level and return to usual activities, good judgment,  stable mood, more realistic expectations, and goal-directed behavior. 7. Normalize energy level and return to usual activities, good judgment,  stable mood, more realistic expectations, and goal-directed behavior. Diagnosis 296.52 (Bipolar I disorder, most recent episode (or current) depressed, moderate)  Medications  1. Ambien (Dosage: 10mg  qhs)  2. Klonopin (Dosage: 1mg  TID)  3. Olanzapine (Dosage: 5 mg every other day and not on weekends)  4. Prozac (Dosage: 20 mg every other day and not on weekends)  5. Wellbutrin (Dosage: 450 mg qd)  Conditions For Discharge Achievement of treatment goals and objectives  The patient approved this treatment plan and is making progress towards goals.  Nakiah Osgood G Evora Schechter, LCSW

## 2023-05-22 ENCOUNTER — Other Ambulatory Visit: Payer: Medicare HMO

## 2023-05-26 ENCOUNTER — Telehealth: Payer: Self-pay | Admitting: Family Medicine

## 2023-05-26 DIAGNOSIS — E039 Hypothyroidism, unspecified: Secondary | ICD-10-CM

## 2023-05-26 DIAGNOSIS — R944 Abnormal results of kidney function studies: Secondary | ICD-10-CM

## 2023-05-26 DIAGNOSIS — R7309 Other abnormal glucose: Secondary | ICD-10-CM

## 2023-05-26 DIAGNOSIS — E7800 Pure hypercholesterolemia, unspecified: Secondary | ICD-10-CM

## 2023-05-26 NOTE — Telephone Encounter (Signed)
-----   Message from Alvina Chou sent at 05/17/2023  4:02 PM EDT ----- Regarding: Lab orders for Monday, 6.17.24 Patient is scheduled for CPX labs, please order future labs, Thanks , Camelia Eng

## 2023-05-27 ENCOUNTER — Ambulatory Visit (INDEPENDENT_AMBULATORY_CARE_PROVIDER_SITE_OTHER): Payer: Medicare HMO

## 2023-05-27 ENCOUNTER — Other Ambulatory Visit (INDEPENDENT_AMBULATORY_CARE_PROVIDER_SITE_OTHER): Payer: Medicare HMO

## 2023-05-27 VITALS — Ht 60.5 in | Wt 224.0 lb

## 2023-05-27 DIAGNOSIS — R7309 Other abnormal glucose: Secondary | ICD-10-CM

## 2023-05-27 DIAGNOSIS — R944 Abnormal results of kidney function studies: Secondary | ICD-10-CM

## 2023-05-27 DIAGNOSIS — E7800 Pure hypercholesterolemia, unspecified: Secondary | ICD-10-CM

## 2023-05-27 DIAGNOSIS — E039 Hypothyroidism, unspecified: Secondary | ICD-10-CM

## 2023-05-27 DIAGNOSIS — Z1231 Encounter for screening mammogram for malignant neoplasm of breast: Secondary | ICD-10-CM | POA: Diagnosis not present

## 2023-05-27 DIAGNOSIS — Z0000 Encounter for general adult medical examination without abnormal findings: Secondary | ICD-10-CM

## 2023-05-27 LAB — CBC WITH DIFFERENTIAL/PLATELET
Basophils Absolute: 0 10*3/uL (ref 0.0–0.1)
Basophils Relative: 0.7 % (ref 0.0–3.0)
Eosinophils Absolute: 0.2 10*3/uL (ref 0.0–0.7)
Eosinophils Relative: 3.3 % (ref 0.0–5.0)
HCT: 41.5 % (ref 36.0–46.0)
Hemoglobin: 13.4 g/dL (ref 12.0–15.0)
Lymphocytes Relative: 37.1 % (ref 12.0–46.0)
Lymphs Abs: 2.2 10*3/uL (ref 0.7–4.0)
MCHC: 32.3 g/dL (ref 30.0–36.0)
MCV: 91.1 fl (ref 78.0–100.0)
Monocytes Absolute: 0.4 10*3/uL (ref 0.1–1.0)
Monocytes Relative: 7 % (ref 3.0–12.0)
Neutro Abs: 3.1 10*3/uL (ref 1.4–7.7)
Neutrophils Relative %: 51.9 % (ref 43.0–77.0)
Platelets: 251 10*3/uL (ref 150.0–400.0)
RBC: 4.55 Mil/uL (ref 3.87–5.11)
RDW: 13.5 % (ref 11.5–15.5)
WBC: 6 10*3/uL (ref 4.0–10.5)

## 2023-05-27 LAB — LIPID PANEL
Cholesterol: 190 mg/dL (ref 0–200)
HDL: 54 mg/dL (ref >39.00)
LDL Cholesterol: 103 mg/dL — ABNORMAL HIGH (ref 0–99)
NonHDL: 136.18
Total CHOL/HDL Ratio: 4
Triglycerides: 167 mg/dL — ABNORMAL HIGH (ref 0.0–149.0)
VLDL: 33.4 mg/dL (ref 0.0–40.0)

## 2023-05-27 LAB — COMPREHENSIVE METABOLIC PANEL
ALT: 11 U/L (ref 0–35)
AST: 15 U/L (ref 0–37)
Albumin: 4.2 g/dL (ref 3.5–5.2)
Alkaline Phosphatase: 66 U/L (ref 39–117)
BUN: 15 mg/dL (ref 6–23)
CO2: 26 mEq/L (ref 19–32)
Calcium: 9.2 mg/dL (ref 8.4–10.5)
Chloride: 106 mEq/L (ref 96–112)
Creatinine, Ser: 1.28 mg/dL — ABNORMAL HIGH (ref 0.40–1.20)
GFR: 44.05 mL/min — ABNORMAL LOW (ref >60.00)
Glucose, Bld: 91 mg/dL (ref 70–99)
Potassium: 4.6 mEq/L (ref 3.5–5.1)
Sodium: 141 mEq/L (ref 135–145)
Total Bilirubin: 0.4 mg/dL (ref 0.2–1.2)
Total Protein: 6.7 g/dL (ref 6.0–8.3)

## 2023-05-27 LAB — TSH: TSH: 2.98 u[IU]/mL (ref 0.35–5.50)

## 2023-05-27 LAB — VITAMIN D 25 HYDROXY (VIT D DEFICIENCY, FRACTURES): VITD: 38.41 ng/mL (ref 30.00–100.00)

## 2023-05-27 LAB — HEMOGLOBIN A1C: Hgb A1c MFr Bld: 5.7 % (ref 4.6–6.5)

## 2023-05-27 NOTE — Progress Notes (Signed)
I connected with  [name] [name] on 05/27/23 by a audio enabled telemedicine application and verified that I am speaking with the correct person using two identifiers.  Patient Location: Home  Provider Location: Office/Clinic  I discussed the limitations of evaluation and management by telemedicine. The patient expressed understanding and agreed to proceed.  Subjective:   [name] [name] is a 67 y.o. female who presents for Medicare Annual (Subsequent) preventive examination.  Review of Systems      Cardiac Risk Factors include: advanced age (>42men, >3 women);hypertension     Objective:    Today's Vitals   05/27/23 1127 05/27/23 1128  Weight: 224 lb (101.6 kg)   Height: 5' 0.5" (1.537 m)   PainSc:  7    Body mass index is 43.03 kg/m.     05/27/2023   11:42 AM 02/12/2023   10:46 AM 12/25/2022   12:49 PM 05/25/2022   12:00 PM 12/15/2021    9:55 PM 05/17/2021   11:11 AM 05/16/2020   11:15 AM  Advanced Directives  Does Patient Have a Medical Advance Directive? Yes Yes No No No Yes Yes  Type of Estate agent of [place];Living will Healthcare Power of [place];Living will    Healthcare Power of [place];Living will Healthcare Power of [place];Living will  Does patient want to make changes to medical advance directive? No - Patient declined        Copy of Healthcare Power of Attorney in Chart? Yes - validated most recent copy scanned in chart (See row information) No - copy requested    No - copy requested No - copy requested  Would patient like information on creating a medical advance directive?   Yes (MAU/Ambulatory/Procedural Areas - Information given) No - Patient declined       Current Medications (verified) Outpatient Encounter Medications as of 05/27/2023  Medication Sig   aspirin EC 81 MG tablet Take 81 mg by mouth daily. Swallow whole.   BIOTIN PO Take 1 tablet by mouth daily.   buPROPion (WELLBUTRIN XL) 150 MG 24 hr tablet Take 300 mg by mouth  daily. Take three tablets by mouth every morning   cetirizine (ZYRTEC ALLERGY) 10 MG tablet Take 10 mg by mouth daily as needed for allergies.   Cholecalciferol (VITAMIN D) 2000 UNITS CAPS Take 1 capsule by mouth daily.   clonazePAM (KLONOPIN) 1 MG tablet Take 1 mg by mouth at bedtime.   cyanocobalamin (VITAMIN B12) 1000 MCG tablet Take by mouth.   famotidine (PEPCID) 20 MG tablet Take 1 tablet (20 mg total) by mouth 2 (two) times daily.   fluticasone (FLONASE) 50 MCG/ACT nasal spray Place into the nose.   hydrocortisone (ANUSOL-HC) 2.5 % rectal cream Apply 1 application. topically 2 (two) times daily. To affected area twice daily (around anus/ hemorrhoid)   lamoTRIgine (LAMICTAL) 100 MG tablet Take 150 mg by mouth daily.   levothyroxine (SYNTHROID) 50 MCG tablet TAKE 1 TABLET EVERY DAY BEFORE BREAKFAST   lovastatin (MEVACOR) 40 MG tablet TAKE 1 TABLET EVERY DAY   nystatin (MYCOSTATIN/NYSTOP) powder Apply 1 Application topically 3 (three) times daily. Under abdomen skin fold   Probiotic Product (PROBIOTIC ADVANCED PO) Take by mouth.   tiZANidine (ZANAFLEX) 4 MG tablet Take 1 tablet by mouth at bedtime as needed.   zolpidem (AMBIEN) 10 MG tablet Take 10 mg by mouth at bedtime.   Deutetrabenazine (AUSTEDO XR PATIENT TITRATION) 6 & 12 & 24 MG TEPK Take by mouth.   metroNIDAZOLE (METROGEL) 1 %  gel Apply topically daily. To nose, cheeks for rosacea (Patient not taking: Reported on 05/27/2023)   No facility-administered encounter medications on file as of 05/27/2023.    Allergies (verified) Ingrezza [valbenazine tosylate], Lidocaine, Xylocaine [lidocaine hcl], Amantadine, Austedo xr [deutetrabenazine], Codeine, Lithium, and Tegretol [carbamazepine]   History: Past Medical History:  Diagnosis Date   Actinic keratosis 03/29/2021   R central upper lip   Allergic rhinitis    Atypical mole 12/26/2021   spinal upper back, exc 01/15/22   Basal cell carcinoma 10/16/2010   R lower pretibia     Bipolar 1 disorder (HCC)    Fissure, anal    GERD (gastroesophageal reflux disease)    History of basal cell carcinoma excision    ON FACE   History of squamous cell carcinoma excision    ON RIGHT LEG   Hyperlipidemia    Hypothyroidism    IBS (irritable bowel syndrome)    Liver cyst    Malignant melanoma in situ (HCC) 11/20/2022   Right labia minora. Excision 12/25/2022 at anterior vulva, left of clitoral hood, margins involved. Surgery scheduled for 02/12/2023.   Mallet finger of left hand    small finger   Pre-diabetes    Past Surgical History:  Procedure Laterality Date   CARDIAC CATHETERIZATION  04/10/1999   per pt normal   CARDIOVASCULAR STRESS TEST  02-23-2011  dr Iantha Fallen [facility])   normal nuclear study/  no ischemia/  ef 64%   CATARACT EXTRACTION W/ INTRAOCULAR LENS  IMPLANT, BILATERAL  12/10/2001   Bil   CESAREAN SECTION  12/10/1982   CLOSED REDUCTION FINGER WITH PERCUTANEOUS PINNING Left 04/22/2014   Procedure: LEFT SMALL FINGER CLOSED REDUCTION PINNING;  Surgeon: Sharma Covert, MD;  Location: [facility];  Service: Orthopedics;  Laterality: Left;   COLONOSCOPY  11/09/2002   DILATION AND CURETTAGE OF UTERUS  1977   LAPAROSCOPIC CHOLECYSTECTOMY  03/18/2000   RECONSTRUCTION OF EYELID Bilateral 08/2022   TRANSTHORACIC ECHOCARDIOGRAM  02/23/2011   normal lvf/  ef 58%/  mild rve/   mild lae/  mild  mr  & tr/  mild pulmonary htn   VULVECTOMY N/A 12/25/2022   Procedure: PARTIAL  VULVECTOMY;  Surgeon: [name], MD;  Location: [facility];  Service: Gynecology;  Laterality: N/A;   VULVECTOMY N/A 02/12/2023   Procedure: WIDE LOCAL EXCISION OF VULVA AND REMOVAL OF CLITORIS;  Surgeon: [name], MD;  Location: [facility];  Service: Gynecology;  Laterality: N/A;   WISDOM TOOTH EXTRACTION     Family History  Problem Relation Age of Onset   Stroke Mother    Heart disease Mother    Hypertension Father     Diabetes Father    Hyperlipidemia Father    Diabetes Brother    Colon cancer Neg Hx    Breast cancer Neg Hx    Ovarian cancer Neg Hx    Endometrial cancer Neg Hx    Pancreatic cancer Neg Hx    Prostate cancer Neg Hx    Social History   Socioeconomic History   Marital status: Married    Spouse name: Not on file   Number of children: 1   Years of education: Not on file   Highest education level: Not on file  Occupational History   Occupation: Home    Employer: RETIRED  Tobacco Use   Smoking status: Never   Smokeless tobacco: Never  Vaping Use   Vaping Use: Never used  Substance and  Sexual Activity   Alcohol use: Yes    Alcohol/week: 0.0 standard drinks of alcohol    Comment: wine- rarely   Drug use: No   Sexual activity: Not Currently    Birth control/protection: Post-menopausal  Other Topics Concern   Not on file  Social History Narrative   Moved here from [place]   Social Determinants of Health   Financial Resource Strain: Low Risk  (05/27/2023)   Overall Financial Resource Strain (CARDIA)    Difficulty of Paying Living Expenses: Not hard at all  Food Insecurity: No Food Insecurity (05/27/2023)   Hunger Vital Sign    Worried About Running Out of Food in the Last Year: Never true    Ran Out of Food in the Last Year: Never true  Transportation Needs: No Transportation Needs (05/27/2023)   PRAPARE - Administrator, Civil Service (Medical): No    Lack of Transportation (Non-Medical): No  Physical Activity: Sufficiently Active (05/27/2023)   Exercise Vital Sign    Days of Exercise per Week: 4 days    Minutes of Exercise per Session: 50 min  Stress: No Stress Concern Present (05/27/2023)   [employer] of Occupational Health - Occupational Stress Questionnaire    Feeling of Stress : Not at all  Social Connections: Moderately Integrated (05/27/2023)   Social Connection and Isolation Panel [NHANES]    Frequency of Communication with Friends and Family: More  than three times a week    Frequency of Social Gatherings with Friends and Family: More than three times a week    Attends Religious Services: More than 4 times per year    Active Member of [employer] or Organizations: No    Attends Engineer, structural: Never    Marital Status: Married    Tobacco Counseling Counseling given: Not Answered   Clinical Intake:  Pre-visit preparation completed: Yes  Pain : 0-10 Pain Score: 7  Pain Type: Chronic pain Pain Location: Generalized Pain Descriptors / Indicators: Aching Pain Onset: More than a month ago     Nutritional Risks: None Diabetes: No  How often do you need to have someone help you when you read instructions, pamphlets, or other written materials from your doctor or pharmacy?: 1 - Never  Diabetic? no  Interpreter Needed?: No  Information entered by :: C.Forrester Blando LPN   Activities of Daily Living    05/27/2023   11:44 AM 05/26/2023    4:05 PM  In your present state of health, do you have any difficulty performing the following activities:  Hearing? 0 1  Vision? 0 0  Difficulty concentrating or making decisions? 1 0  Comment occasionally forgets   Walking or climbing stairs? 0 1  Dressing or bathing? 0 0  Doing errands, shopping? 0 0  Preparing Food and eating ? N N  Using the Toilet? N N  In the past six months, have you accidently leaked urine? N N  Do you have problems with loss of bowel control? N N  Managing your Medications? N N  Managing your Finances? N N  Housekeeping or managing your Housekeeping? N N    Patient Care Team: Tower, Audrie Gallus, MD as PCP - General Marcellus Scott, OD as Consulting Physician (Optometry)  Indicate any recent Medical Services you may have received from other than [facility] providers in the past year (date may be approximate).     Assessment:   This is a routine wellness examination for Yerania.  Hearing/Vision screen Hearing Screening -  Comments:: aids Vision Screening -  Comments:: Glasses-[facility]  Dietary issues and exercise activities discussed: Current Exercise Habits: Structured exercise class, Type of exercise: calisthenics;strength training/weights (bike), Time (Minutes): 55, Frequency (Times/Week): 4, Weekly Exercise (Minutes/Week): 220, Intensity: Moderate, Exercise limited by: None identified   Goals Addressed             This Visit's Progress    Patient Stated       Exercise 3-4 times a week.       Depression Screen    05/27/2023   11:41 AM 01/15/2023   11:00 AM 05/28/2022    2:02 PM 05/25/2022   11:58 AM 11/10/2021    4:18 PM 05/17/2021   11:12 AM 08/16/2020    9:31 AM  PHQ 2/9 Scores  PHQ - 2 Score 0 3 0 0 1 4 0  PHQ- 9 Score  9  0 9 4     Fall Risk    05/27/2023   11:43 AM 05/26/2023    4:05 PM 01/15/2023   10:59 AM 05/28/2022    2:02 PM 05/25/2022   12:01 PM  Fall Risk   Falls in the past year? 0 0 0 1 1  Number falls in past yr: 0  0 0 0  Injury with Fall? 0  0 1 1  Risk for fall due to : No Fall Risks  No Fall Risks Impaired balance/gait History of fall(s)  Follow up Falls prevention discussed;Falls evaluation completed  Falls evaluation completed Falls evaluation completed Falls evaluation completed;Falls prevention discussed    FALL RISK PREVENTION PERTAINING TO THE HOME:  Any stairs in or around the home? Yes  If so, are there any without handrails? Yes  Home free of loose throw rugs in walkways, pet beds, electrical cords, etc? Yes  Adequate lighting in your home to reduce risk of falls? Yes   ASSISTIVE DEVICES UTILIZED TO PREVENT FALLS:  Life alert? No  Use of a cane, walker or w/c? No  Grab bars in the bathroom? No  Shower chair or bench in shower? No  Elevated toilet seat or a handicapped toilet? No    Cognitive Function:    05/17/2021   11:19 AM 05/16/2020   11:19 AM 05/08/2019   12:13 PM 02/24/2018   10:12 AM 02/20/2017   10:09 AM  MMSE - Mini Mental State Exam  Orientation to time 5 5 5 5 5    Orientation to Place 5 5 5 5 5   Registration 3 3 3 3 3   Attention/ Calculation 5 5 0 0 0  Recall 3 3 3 3 2   Recall-comments     pt was unable to recall 1 of 3 words  Language- name 2 objects   0 0 0  Language- repeat 1 1 1 1 1   Language- follow 3 step command   0 3 3  Language- read & follow direction   0 0 0  Write a sentence   0 0 0  Copy design   0 0 0  Total score   17 20 19         05/27/2023   11:45 AM 05/25/2022   12:02 PM  6CIT Screen  What Year? 0 points 0 points  What month? 0 points 0 points  What time? 0 points 0 points  Count back from 20 0 points 0 points  Months in reverse 0 points 0 points  Repeat phrase 0 points 0 points  Total Score 0 points 0 points  Immunizations Immunization History  Administered Date(s) Administered   Influenza Inj Mdck Quad Pf 08/19/2022   Influenza Split 09/16/2012   Influenza Whole 10/01/2006, 09/14/2008, 09/07/2009, 09/04/2010, 08/21/2011   Influenza, Seasonal, Injecte, Preservative Fre 09/13/2016   Influenza,inj,Quad PF,6+ Mos 08/26/2017, 08/25/2018, 08/07/2019, 08/05/2020   Influenza-Unspecified 09/08/2013, 08/10/2014, 08/25/2015   PFIZER(Purple Top)SARS-COV-2 Vaccination 02/27/2020, 03/23/2020   Pneumococcal Polysaccharide-23 08/15/2006, 10/16/2013   Td 12/10/1998, 09/30/2009   Tdap 08/29/2017   Zoster Recombinat (Shingrix) 05/22/2021   Zoster, Live 04/13/2011    TDAP status: Up to date  Flu Vaccine status: Up to date  Pneumococcal vaccine status: Due, Education has been provided regarding the importance of this vaccine. Advised may receive this vaccine at local pharmacy or Health Dept. Aware to provide a copy of the vaccination record if obtained from local pharmacy or Health Dept. Verbalized acceptance and understanding.  Covid-19 vaccine status: Declined, Education has been provided regarding the importance of this vaccine but patient still declined. Advised may receive this vaccine at local pharmacy or Health  Dept.or vaccine clinic. Aware to provide a copy of the vaccination record if obtained from local pharmacy or Health Dept. Verbalized acceptance and understanding.  Qualifies for Shingles Vaccine? Yes   Zostavax completed Yes   Shingrix Completed?: Yes , pt will bring documentation.  Screening Tests Health Maintenance  Topic Date Due   COVID-19 Vaccine (3 - Pfizer risk series) 04/20/2020   Zoster Vaccines- Shingrix (2 of 2) 07/17/2021   Pneumonia Vaccine 18+ Years old (2 of 2 - PCV) 03/10/2023   DEXA SCAN  Never done   HIV Screening  01/12/2024 (Originally 03/09/1971)   INFLUENZA VACCINE  07/11/2023   MAMMOGRAM  11/14/2023   Medicare Annual Wellness (AWV)  05/26/2024   Colonoscopy  05/19/2025   PAP SMEAR-Modifier  05/28/2025   DTaP/Tdap/Td (4 - Td or Tdap) 08/30/2027   Hepatitis C Screening  Completed   HPV VACCINES  Aged Out    Health Maintenance  Health Maintenance Due  Topic Date Due   COVID-19 Vaccine (3 - Pfizer risk series) 04/20/2020   Zoster Vaccines- Shingrix (2 of 2) 07/17/2021   Pneumonia Vaccine 42+ Years old (2 of 2 - PCV) 03/10/2023   DEXA SCAN  Never done    Colorectal cancer screening: Type of screening: Colonoscopy. Completed 05/20/2015. Repeat every 10 years  Mammogram status: Completed 11/13/22. Repeat every year  Bone density- Will discuss with PCP  Lung Cancer Screening: (Low Dose CT Chest recommended if Age 16-80 years, 30 pack-year currently smoking OR have quit w/in 15years.) does not qualify.   Lung Cancer Screening Referral: no  Additional Screening:  Hepatitis C Screening: does qualify; Completed 12/28/16  Vision Screening: Recommended annual ophthalmology exams for early detection of glaucoma and other disorders of the eye. Is the patient up to date with their annual eye exam?  Yes  Who is the provider or what is the name of the office in which the patient attends annual eye exams? [facility] If pt is not established with a provider,  would they like to be referred to a provider to establish care? Yes .   Dental Screening: Recommended annual dental exams for proper oral hygiene  Community Resource Referral / Chronic Care Management: CRR required this visit?  No   CCM required this visit?  No      Plan:     I have personally reviewed and noted the following in the patient's chart:   Medical and social history Use of alcohol, tobacco  or illicit drugs  Current medications and supplements including opioid prescriptions. Patient is not currently taking opioid prescriptions. Functional ability and status Nutritional status Physical activity Advanced directives List of other physicians Hospitalizations, surgeries, and ER visits in previous 12 months Vitals Screenings to include cognitive, depression, and falls Referrals and appointments  In addition, I have reviewed and discussed with patient certain preventive protocols, quality metrics, and best practice recommendations. A written personalized care plan for preventive services as well as general preventive health recommendations were provided to patient.     Maryan Puls, LPN   1/61/0960   Nurse Notes: Order placed for annual mammogram.

## 2023-05-27 NOTE — Patient Instructions (Signed)
[name] , Thank you for taking time to come for your Medicare Wellness Visit. I appreciate your ongoing commitment to your health goals. Please review the following plan we discussed and let me know if I can assist you in the future.   These are the goals we discussed:  Goals      DIET - EAT MORE FRUITS AND VEGETABLES     Patient Stated     Starting 05/08/19, I will continue to take medications as prescribed.      Patient Stated     05/16/2020, I will continue to do Silver Sneakers 4 days a week for 45 minutes.      Patient Stated     05/17/2021, I will continue to do cardio and weight training for 45 minutes 4 days a week.      Patient Stated     Exercise 3-4 times a week.        This is a list of the screening recommended for you and due dates:  Health Maintenance  Topic Date Due   COVID-19 Vaccine (3 - Pfizer risk series) 04/20/2020   Zoster (Shingles) Vaccine (2 of 2) 07/17/2021   Pneumonia Vaccine (2 of 2 - PCV) 03/10/2023   DEXA scan (bone density measurement)  Never done   HIV Screening  01/12/2024*   Flu Shot  07/11/2023   Mammogram  11/14/2023   Medicare Annual Wellness Visit  05/26/2024   Colon Cancer Screening  05/19/2025   Pap Smear  05/28/2025   DTaP/Tdap/Td vaccine (4 - Td or Tdap) 08/30/2027   Hepatitis C Screening  Completed   HPV Vaccine  Aged Out  *Topic was postponed. The date shown is not the original due date.    Advanced directives: in the chart.  Conditions/risks identified: none  Next appointment: Follow up in one year for your annual wellness visit 05/27/24 @ 1pm televisit   Preventive Care 65 Years and Older, Female Preventive care refers to lifestyle choices and visits with your health care provider that can promote health and wellness. What does preventive care include? A yearly physical exam. This is also called an annual well check. Dental exams once or twice a year. Routine eye exams. Ask your health care provider how often you  should have your eyes checked. Personal lifestyle choices, including: Daily care of your teeth and gums. Regular physical activity. Eating a healthy diet. Avoiding tobacco and drug use. Limiting alcohol use. Practicing safe sex. Taking low-dose aspirin every day. Taking vitamin and mineral supplements as recommended by your health care provider. What happens during an annual well check? The services and screenings done by your health care provider during your annual well check will depend on your age, overall health, lifestyle risk factors, and family history of disease. Counseling  Your health care provider may ask you questions about your: Alcohol use. Tobacco use. Drug use. Emotional well-being. Home and relationship well-being. Sexual activity. Eating habits. History of falls. Memory and ability to understand (cognition). Work and work Astronomer. Reproductive health. Screening  You may have the following tests or measurements: Height, weight, and BMI. Blood pressure. Lipid and cholesterol levels. These may be checked every 5 years, or more frequently if you are over 88 years old. Skin check. Lung cancer screening. You may have this screening every year starting at age 67 if you have a 30-pack-year history of smoking and currently smoke or have quit within the past 15 years. Fecal occult blood test (FOBT) of the stool.  You may have this test every year starting at age 35. Flexible sigmoidoscopy or colonoscopy. You may have a sigmoidoscopy every 5 years or a colonoscopy every 10 years starting at age 80. Hepatitis C blood test. Hepatitis B blood test. Sexually transmitted disease (STD) testing. Diabetes screening. This is done by checking your blood sugar (glucose) after you have not eaten for a while (fasting). You may have this done every 1-3 years. Bone density scan. This is done to screen for osteoporosis. You may have this done starting at age 35. Mammogram. This may  be done every 1-2 years. Talk to your health care provider about how often you should have regular mammograms. Talk with your health care provider about your test results, treatment options, and if necessary, the need for more tests. Vaccines  Your health care provider may recommend certain vaccines, such as: Influenza vaccine. This is recommended every year. Tetanus, diphtheria, and acellular pertussis (Tdap, Td) vaccine. You may need a Td booster every 10 years. Zoster vaccine. You may need this after age 29. Pneumococcal 13-valent conjugate (PCV13) vaccine. One dose is recommended after age 27. Pneumococcal polysaccharide (PPSV23) vaccine. One dose is recommended after age 79. Talk to your health care provider about which screenings and vaccines you need and how often you need them. This information is not intended to replace advice given to you by your health care provider. Make sure you discuss any questions you have with your health care provider. Document Released: 12/23/2015 Document Revised: 08/15/2016 Document Reviewed: 09/27/2015 Elsevier Interactive Patient Education  2017 [employer].  Fall Prevention in the Home Falls can cause injuries. They can happen to people of all ages. There are many things you can do to make your home safe and to help prevent falls. What can I do on the outside of my home? Regularly fix the edges of walkways and driveways and fix any cracks. Remove anything that might make you trip as you walk through a door, such as a raised step or threshold. Trim any bushes or trees on the path to your home. Use bright outdoor lighting. Clear any walking paths of anything that might make someone trip, such as rocks or tools. Regularly check to see if handrails are loose or broken. Make sure that both sides of any steps have handrails. Any raised decks and porches should have guardrails on the edges. Have any leaves, snow, or ice cleared regularly. Use sand or salt  on walking paths during winter. Clean up any spills in your garage right away. This includes oil or grease spills. What can I do in the bathroom? Use night lights. Install grab bars by the toilet and in the tub and shower. Do not use towel bars as grab bars. Use non-skid mats or decals in the tub or shower. If you need to sit down in the shower, use a plastic, non-slip stool. Keep the floor dry. Clean up any water that spills on the floor as soon as it happens. Remove soap buildup in the tub or shower regularly. Attach bath mats securely with double-sided non-slip rug tape. Do not have throw rugs and other things on the floor that can make you trip. What can I do in the bedroom? Use night lights. Make sure that you have a light by your bed that is easy to reach. Do not use any sheets or blankets that are too big for your bed. They should not hang down onto the floor. Have a firm chair that has  side arms. You can use this for support while you get dressed. Do not have throw rugs and other things on the floor that can make you trip. What can I do in the kitchen? Clean up any spills right away. Avoid walking on wet floors. Keep items that you use a lot in easy-to-reach places. If you need to reach something above you, use a strong step stool that has a grab bar. Keep electrical cords out of the way. Do not use floor polish or wax that makes floors slippery. If you must use wax, use non-skid floor wax. Do not have throw rugs and other things on the floor that can make you trip. What can I do with my stairs? Do not leave any items on the stairs. Make sure that there are handrails on both sides of the stairs and use them. Fix handrails that are broken or loose. Make sure that handrails are as long as the stairways. Check any carpeting to make sure that it is firmly attached to the stairs. Fix any carpet that is loose or worn. Avoid having throw rugs at the top or bottom of the stairs. If you  do have throw rugs, attach them to the floor with carpet tape. Make sure that you have a light switch at the top of the stairs and the bottom of the stairs. If you do not have them, ask someone to add them for you. What else can I do to help prevent falls? Wear shoes that: Do not have high heels. Have rubber bottoms. Are comfortable and fit you well. Are closed at the toe. Do not wear sandals. If you use a stepladder: Make sure that it is fully opened. Do not climb a closed stepladder. Make sure that both sides of the stepladder are locked into place. Ask someone to hold it for you, if possible. Clearly mark and make sure that you can see: Any grab bars or handrails. First and last steps. Where the edge of each step is. Use tools that help you move around (mobility aids) if they are needed. These include: Canes. Walkers. Scooters. Crutches. Turn on the lights when you go into a dark area. Replace any light bulbs as soon as they burn out. Set up your furniture so you have a clear path. Avoid moving your furniture around. If any of your floors are uneven, fix them. If there are any pets around you, be aware of where they are. Review your medicines with your doctor. Some medicines can make you feel dizzy. This can increase your chance of falling. Ask your doctor what other things that you can do to help prevent falls. This information is not intended to replace advice given to you by your health care provider. Make sure you discuss any questions you have with your health care provider. Document Released: 09/22/2009 Document Revised: 05/03/2016 Document Reviewed: 12/31/2014 Elsevier Interactive Patient Education  2017 [employer].

## 2023-05-28 ENCOUNTER — Ambulatory Visit (INDEPENDENT_AMBULATORY_CARE_PROVIDER_SITE_OTHER): Payer: Medicare HMO | Admitting: Psychology

## 2023-05-28 DIAGNOSIS — F3181 Bipolar II disorder: Secondary | ICD-10-CM | POA: Diagnosis not present

## 2023-05-28 NOTE — Progress Notes (Addendum)
[facility] Counselor/Therapist Progress Note  Patient ID: [name] [name], MRN: [id number],    Date: 05/28/2023  Time Spent: 60 minutes  Time in: 2:00  Time out : 3:00  Treatment Type: Individual Therapy  Reported Symptoms: none  Mental Status Exam: Appearance:  Casual     Behavior: Appropriate  Motor: Normal  Speech/Language:  normal  Affect: Appropriate  Mood: anxious  Thought process: normal  Thought content:   WNL  Sensory/Perceptual disturbances:   WNL  Orientation: oriented to person, place, time/date, and situation  Attention: Good  Concentration: Good  Memory: WNL  Fund of knowledge:  Good  Insight:   Good  Judgment:  Good  Impulse Control: Good   Risk Assessment: Danger to Self:  No Self-injurious Behavior: No Danger to Others: No Duty to Warn:no Physical Aggression / Violence:No  Access to Firearms a concern: No  Gang Involvement:No   Subjective: The patient attended a face-to-face individual therapy session via video visit today.  The patient agreed and consented for this session to be on caregility and patient is aware of limitations of telehealth.  The patient was in her home alone and the therapist was in the office.   The patient presents as somewhat anxious today.  The patient reports that she just got back from her sisters after having spent 8 days in [place].  The patient reports that her husband is working at [employer] or [employer] and that she thinks maybe some of her angst or sadness might be related to just having gotten back from a trip.  The patient reports that she is still struggling a little bit with having cancer cells in her body and she has some follow-up appointments with her doctor in the near future.  We did some reframing of these thoughts so that she cannot get so stressed about them.  In addition we talked about her angst about getting older and not having a good plan for care.  She states that she would like to move to  [place] to be near her daughter and her husband is wanting to stay here.  She and her husband have a very large house that they have to maintain and we talked about the importance of having a plan in place and possibly her having another conversation with her husband about this.  Interventions: Cognitive Behavioral Therapy  Diagnosis:Bipolar II disorder (HCC)  Plan: Client Abilities/Strengths  intelligent, insightful  Client Treatment Preferences  Outpatient individual therapy  Client Statement of Needs  "I need someone to help monitor and manage my bipolar"  Treatment Level  Outpatient individual therapy  Symptoms  Depressed or irritable mood.: (Status: improved). Diminished interest in or  enjoyment of activities.: (Status: improved). Displays a poor attention span and is easily distracted.:  (Status: improved). Exhibits an abnormally and  persistently elevated, expansive, or irritable mood with at least three symptoms of mania (i.e., inflated  self-esteem or grandiosity, decreased need for sleep, pressured speech, flight of ideas, distractibility,  excessive goal-directed activity or psychomotor agitation, excessive involvement in pleasurable, highrisk behavior).:  (Status: improved). Feelings of hopelessness, worthlessness, or inappropriate guilt.: (Status: improved). History of at least one hypomanic,  manic, or mixed mood episode.:  (Status: maintained). History of chronic or  recurrent depression for which the client has taken antidepressant medication, been hospitalized, had  outpatient treatment, or had a course of electroconvulsive therapy.:  (Status:  maintained). Lack of energy.: (Status: improved). Lacks follow-through in  projects, even though  energy is very high, since behavior lacks discipline and goal-directedness.:  (Status: improved). Poor concentration and indecisiveness.:  (Status: improved). Reports flight of ideas or thoughts racing.: (Status: improved). Shows evidence  of a decreased need for sleep.: (Status: improved).  Sleeplessness or hypersomnia.:  (Status: improved). Social withdrawal.: (Status: improved). Suicidal thoughts and/or gestures.:   (Status: improved). The elevated mood or irritability (mania) causes marked impairment in  occupational functioning, social activities, or relationships with others.: (Status: improved). Verbalizes grandiose ideas and/or persecutory beliefs.:  (Status: improved).  Problems Addressed  Bipolar Disorder - Mania, Bipolar Disorder - Depression, Bipolar Disorder - Mania, Bipolar Disorder -  Mania, Bipolar Disorder - Depression, Bipolar Disorder - Depression, Bipolar Disorder - Mania  Goals 1. Achieve controlled behavior, moderated mood, more deliberative  speech and thought process, and a stable daily activity pattern. 2. Achieve controlled behavior, moderated mood, more deliberative  speech and thought process, and a stable daily activity pattern. 3. Alleviate manic/hypomanic mood and return to previous level of  effective functioning. 4. Develop healthy cognitive patterns and beliefs about self and the world  that lead to alleviation and help prevent the relapse of  manic/hypomanic episodes. Objective Maintain a pattern of regular rhythm to daily activities. Target Date: 2024-05-07 Frequency: Monthly Progress: 80 Modality: individual Related Interventions 1. Assist the client in establishing a more routine pattern of daily activities such as sleeping,  eating, solitary and social activities, and exercise; use and review a form to schedule, assess,  and modify these activities so that they occur in a predictable rhythm every day. Objective Identify and replace thoughts and behaviors that trigger manic or depressive symptoms. Target Date: 2024-05-07 Frequency: Monthly Progress: 80 Modality: individual Related Interventions 1. Use cognitive therapy techniques to explore and educate the client about cognitive  biases that  trigger his/her elevated or depressive mood (see Cognitive Therapy for Bipolar Disorder by  Aggie Hacker al.). 2. Teach the client cognitive-behavioral coping and relapse prevention skills including delaying  impulsive actions, structured scheduling of daily activities, keeping a regular sleep routine,  avoiding unrealistic goal striving, using relaxation procedures, identifying and avoiding episode triggers such as stimulant drug use, alcohol consumption, breaking sleep routine, or exposing  self to high stress (see Cognitive Therapy for Bipolar Disorder by Aggie Hacker al.). 3. Use cognitive therapy techniques to explore and educate the client's about cognitive biases that  trigger his/her elevated or depressive mood (see Cognitive Therapy for Bipolar Disorder by  Aggie Hacker al.). Objective Discuss and resolve troubling personal and interpersonal issues. Target Date: 2024-05-07 Frequency: Monthly Progress: 60 Modality: individual Objective Participate in periodic "maintenance" sessions. Target Date: 2024-05-07 Frequency: Monthly Progress: 80 Modality: individual Related Interventions 1. Hold periodic "maintenance" sessions within the first few months after therapy to facilitate the  client's positive changes; problem-solve obstacles to improvement. 2. Hold periodic "maintenance" sessions within the first few months after therapy to facilitate the  client's positive changes; problem-solve obstacles to improvement. Objective Describe mood state, energy level, amount of control over thoughts, and sleeping pattern. Target Date: 2024-05-07 Frequency: Monthly Progress: 80 Modality: individual Related Interventions 1. Encourage the client to share his/her thoughts and feelings; express empathy and build rapport  while assessing primary cognitive, behavioral, interpersonal, or other symptoms of the mood  disorder. 2. Assess presence, severity, and impact of past and present mood episodes on  social,  occupational, and interpersonal functioning; supplement with semi-structured inventory, if  desired (e.g., Montgomery-Asberg Depression Rating Scale, Inventory to Diagnose  Depression). 3. Encourage  the client to share his/her thoughts and feelings; express empathy, and build rapport  while assessing primary cognitive, behavioral, interpersonal, or other symptoms of the mood  disorder. 4. Assess presence, severity, and impact of past and present mood episodes including mania (i.e.,  pressured speech, impulsive behavior, euphoric mood, flight of ideas, reduced need for sleep,  inflated self-esteem, and high energy) on social, occupational, and interpersonal functioning;  supplement with semi-structured inventory, if desired (e.g., Young Mania Rating Scale; the  Clinical Monitoring Form); re-administer as indicated to assess treatment response. 5. Develop healthy cognitive patterns and beliefs about self and the world  that lead to alleviation and help prevent the relapse of mood  episodes. 6. Normalize energy level and return to usual activities, good judgment,  stable mood, more realistic expectations, and goal-directed behavior. 7. Normalize energy level and return to usual activities, good judgment,  stable mood, more realistic expectations, and goal-directed behavior. Diagnosis 296.52 (Bipolar I disorder, most recent episode (or current) depressed, moderate)  Medications  1. Ambien (Dosage: 10mg  qhs)  2. Klonopin (Dosage: 1mg  TID)  3. Olanzapine (Dosage: 5 mg every other day and not on weekends)  4. Prozac (Dosage: 20 mg every other day and not on weekends)  5. Wellbutrin (Dosage: 450 mg qd)  Conditions For Discharge Achievement of treatment goals and objectives  The patient approved this treatment plan and is making progress towards goals.  Dylynn Ketner G Kayona Foor,  LCSW

## 2023-05-30 ENCOUNTER — Telehealth: Payer: Self-pay | Admitting: Family Medicine

## 2023-05-30 ENCOUNTER — Encounter: Payer: Self-pay | Admitting: Family Medicine

## 2023-05-30 ENCOUNTER — Ambulatory Visit (INDEPENDENT_AMBULATORY_CARE_PROVIDER_SITE_OTHER): Payer: Medicare HMO | Admitting: Family Medicine

## 2023-05-30 VITALS — BP 126/70 | HR 50 | Temp 97.9°F | Ht 60.25 in | Wt 224.5 lb

## 2023-05-30 DIAGNOSIS — E7800 Pure hypercholesterolemia, unspecified: Secondary | ICD-10-CM | POA: Diagnosis not present

## 2023-05-30 DIAGNOSIS — R001 Bradycardia, unspecified: Secondary | ICD-10-CM

## 2023-05-30 DIAGNOSIS — E039 Hypothyroidism, unspecified: Secondary | ICD-10-CM

## 2023-05-30 DIAGNOSIS — M170 Bilateral primary osteoarthritis of knee: Secondary | ICD-10-CM

## 2023-05-30 DIAGNOSIS — Z23 Encounter for immunization: Secondary | ICD-10-CM | POA: Diagnosis not present

## 2023-05-30 DIAGNOSIS — E2839 Other primary ovarian failure: Secondary | ICD-10-CM

## 2023-05-30 DIAGNOSIS — R7309 Other abnormal glucose: Secondary | ICD-10-CM

## 2023-05-30 DIAGNOSIS — R944 Abnormal results of kidney function studies: Secondary | ICD-10-CM

## 2023-05-30 DIAGNOSIS — Z1211 Encounter for screening for malignant neoplasm of colon: Secondary | ICD-10-CM

## 2023-05-30 DIAGNOSIS — K219 Gastro-esophageal reflux disease without esophagitis: Secondary | ICD-10-CM

## 2023-05-30 DIAGNOSIS — Z0000 Encounter for general adult medical examination without abnormal findings: Secondary | ICD-10-CM

## 2023-05-30 DIAGNOSIS — F3177 Bipolar disorder, in partial remission, most recent episode mixed: Secondary | ICD-10-CM

## 2023-05-30 DIAGNOSIS — D038 Melanoma in situ of other sites: Secondary | ICD-10-CM

## 2023-05-30 DIAGNOSIS — Z0001 Encounter for general adult medical examination with abnormal findings: Secondary | ICD-10-CM

## 2023-05-30 DIAGNOSIS — M5137 Other intervertebral disc degeneration, lumbosacral region: Secondary | ICD-10-CM

## 2023-05-30 LAB — POC URINALSYSI DIPSTICK (AUTOMATED)
Bilirubin, UA: NEGATIVE
Blood, UA: NEGATIVE
Glucose, UA: NEGATIVE
Ketones, UA: NEGATIVE
Leukocytes, UA: NEGATIVE
Nitrite, UA: NEGATIVE
Protein, UA: NEGATIVE
Spec Grav, UA: 1.015 (ref 1.010–1.025)
Urobilinogen, UA: 0.2 E.U./dL
pH, UA: 5.5 (ref 5.0–8.0)

## 2023-05-30 LAB — POCT UA - MICROSCOPIC ONLY
Bacteria, U Microscopic: 0
RBC, Urine, Miroscopic: 0 (ref 0–2)

## 2023-05-30 NOTE — Assessment & Plan Note (Signed)
Recent vulvar surgery  Bx upcoming for some more cells

## 2023-05-30 NOTE — Assessment & Plan Note (Signed)
Pulse rate of 50  Pt is not dizzy currently  No syncope or pre syncope Was more out of shape after surgery- watching for improvement in exercise intolerance  Encouraged to watch this carefully  Low threshold for cardiology ref

## 2023-05-30 NOTE — Assessment & Plan Note (Signed)
Discussed how this problem influences overall health and the risks it imposes  Reviewed plan for weight loss with lower calorie diet (via better food choices and also portion control or program like weight watchers) and exercise building up to or more than 30 minutes 5 days per week including some aerobic activity   Encouraged strength training when able

## 2023-05-30 NOTE — Assessment & Plan Note (Signed)
Hypothyroidism  Pt has no clinical changes No change in energy level/ hair or skin/ edema and no tremor Lab Results  Component Value Date   TSH 2.98 05/27/2023    Plan to continue levothyroxine 50 mcg daily

## 2023-05-30 NOTE — Assessment & Plan Note (Signed)
Ref to ortho for further eval/tx

## 2023-05-30 NOTE — Patient Instructions (Addendum)
Prevnar 20 vaccine today   I will refer to orthopedics Please let us know if you don't hear in 1-2 weeks   Go back to lovastatin in the evening   Your pulse is in the low / low normal range  Watch it  Let us know if you get more dizzy or if exercise intolerance does not improve as you get back to normal  Urinalysis today   I will send copy of labs to Dr Delle Reining   See dermatology about spot on face   You have an order for:  []   2D Mammogram  []   3D Mammogram  [x]   Bone Density     Please call for appointment:   [x]   [facility]  [address] [place] [address]  [phone]  []   [facility]   [address]  [place], [place] [address]  [phone]  []   [facility]      [address] [place], [place]        [phone]         []   [facility]  [address] [place], [place]  [phone]  []  [facility]   [address]   [place], [place] [address]  [phone]  []  [facility]  [address] [place], [place] [address] [phone]    Make sure to wear two piece clothing  No lotions powders or deodorants the day of the appointment Make sure to bring picture ID and insurance card.  Bring list of medications you are currently taking including any supplements.   Schedule your screening mammogram through MyChart!   Select [place] imaging sites can now be scheduled through MyChart.  Log into your MyChart account.  Go to 'Visit' (or 'Appointments' if  on mobile App) --> Schedule an  Appointment  Under 'Select a Reason for Visit' choose the Mammogram  Screening option.  Complete the pre-visit questions  and select the time and place that  best fits your schedule

## 2023-05-30 NOTE — Assessment & Plan Note (Signed)
Dexa ordered

## 2023-05-30 NOTE — Assessment & Plan Note (Signed)
Recently worse depression symptoms with vulvar cancer and surgery  Psychiatry increase her lamictal- helpful  Still sees counselor / [name] [name]  PHQ 0 today Doing better

## 2023-05-30 NOTE — Assessment & Plan Note (Addendum)
Reviewed health habits including diet and exercise and skin cancer prevention Reviewed appropriate screening tests for age  Also reviewed health mt list, fam hx and immunization status , as well as social and family history   See HPI Labs reviewed and ordered Sent for date of 2nd shingrix vaccine Prevnar 20 vaccine given today  Mammo utd 11/2022 Colonoscopy utd 05/2015 Dexa ordered/ no falls or fracture, discussed bone health Utd gyn care, had vulvar surg for melanoma in situ PHQ 0  Seeing derm frequently for vulvar cancer,-will follow up for fleshy spot on face  Encouraged to continue sun protection

## 2023-05-30 NOTE — Progress Notes (Signed)
Subjective:    Patient ID: [name] [name], female    DOB: September 12, 1956, 67 y.o.   MRN: [id number]  HPI  Here for health maintenance exam and to review chronic medical problems   Wt Readings from Last 3 Encounters:  05/30/23 224 lb 8 oz (101.8 kg)  05/27/23 224 lb (101.6 kg)  04/01/23 227 lb 9.6 oz (103.2 kg)   43.48 kg/m  Vitals:   05/30/23 1046 05/30/23 1126  BP: 126/70   Pulse: (!) 45 (!) 50  Temp: 97.9 F (36.6 C)   SpO2: 98%    Low pulse More out of breath lately  Had gyn surgery - finally back on bike - ? Deconditioned   Has a skin spot near right eye  Called derm- waiting for call back / Dr Roseanne Reno   Knees and back and ankle are problematic  Has arthritis Does not have ortho doctor  Dr Cortisone shots did not help       Immunization History  Administered Date(s) Administered   Influenza Inj Mdck Quad Pf 08/19/2022   Influenza Split 09/16/2012   Influenza Whole 10/01/2006, 09/14/2008, 09/07/2009, 09/04/2010, 08/21/2011   Influenza, Seasonal, Injecte, Preservative Fre 09/13/2016   Influenza,inj,Quad PF,6+ Mos 08/26/2017, 08/25/2018, 08/07/2019, 08/05/2020   Influenza-Unspecified 09/08/2013, 08/10/2014, 08/25/2015   PFIZER(Purple Top)SARS-COV-2 Vaccination 02/27/2020, 03/23/2020   PNEUMOCOCCAL CONJUGATE-20 05/30/2023   Pneumococcal Polysaccharide-23 08/15/2006, 10/16/2013   Td 12/10/1998, 09/30/2009   Tdap 08/29/2017   Zoster Recombinat (Shingrix) 05/22/2021   Zoster, Live 04/13/2011    Health Maintenance Due  Topic Date Due   Zoster Vaccines- Shingrix (2 of 2) 07/17/2021   DEXA SCAN  Never done   Shingrix-she did get the 2nd shingrix 07/2022 - will send for report   Pna vaccine -due for prevnar 20 , will get today    Mammogram12/2023 -it is scheduled for next December  Self breast exam: no lumps   Sister was just dx with breast cancer at 61   Gyn care/ pap - ascus wit hneg HPV 05/2022  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 had surgery   Still some cells -another biopsy scheduled    Colon cancer screening -colonoscopy 05/2015    Dexa - wants to order  Helyn App this year  Supplements D3  2000 international units  Exercise -getting back on bike , also classes with weights 3 times per week  Has knee problems / hips and back- worse   Mood In treatment for bipolar disorder with psychiatry Went up on lamictal due to tearfulness (stress) it helped  Still counseling with Bambi Cottle     05/27/2023   11:41 AM 01/15/2023   11:00 AM 05/28/2022    2:02 PM 05/25/2022   11:58 AM 11/10/2021    4:18 PM  Depression screen PHQ 2/9  Decreased Interest 0 1 0 0 1  Down, Depressed, Hopeless 0 2 0 0 0  PHQ - 2 Score 0 3 0 0 1  Altered sleeping  2  0 1  Tired, decreased energy  2  0 2  Change in appetite  1  0 0  Feeling bad or failure about yourself   1  0 1  Trouble concentrating  0  0 2  Moving slowly or fidgety/restless  0  0 2  Suicidal thoughts  0  0 0  PHQ-9 Score  9  0 9  Difficult doing work/chores  Somewhat difficult  Not difficult at all Somewhat difficult    GERD  Takes pepcid 20 mg bid   Hypothyroidism  Pt has no clinical changes No change in energy level/ hair or skin/ edema and no tremor Lab Results  Component Value Date   TSH 2.98 05/27/2023    Levothyroxine 50 mcg daily  Hyperlipidemia Lab Results  Component Value Date   CHOL 190 05/27/2023   CHOL 171 05/18/2022   CHOL 189 05/17/2021   Lab Results  Component Value Date   HDL 54.00 05/27/2023   HDL 68.40 05/18/2022   HDL 66.90 05/17/2021   Lab Results  Component Value Date   LDLCALC 103 (H) 05/27/2023   LDLCALC 75 05/18/2022   LDLCALC 95 05/17/2021   Lab Results  Component Value Date   TRIG 167.0 (H) 05/27/2023   TRIG 138.0 05/18/2022   TRIG 140.0 05/17/2021   Lab Results  Component Value Date   CHOLHDL 4 05/27/2023   CHOLHDL 2 05/18/2022   CHOLHDL 3 05/17/2021   No results found for: "LDLDIRECT" Lovastatin 40  mg daily  Started taking it in the am (will go back to pm)   Diet is very good     History of decreased GFR CMP     Component Value Date/Time   NA 141 05/27/2023 0857   NA 146 (H) 10/19/2015 0905   NA 142 06/20/2014 1303   K 4.6 05/27/2023 0857   K 4.8 06/20/2014 1303   CL 106 05/27/2023 0857   CL 107 06/20/2014 1303   CO2 26 05/27/2023 0857   CO2 30 06/20/2014 1303   GLUCOSE 91 05/27/2023 0857   GLUCOSE 81 06/20/2014 1303   BUN 15 05/27/2023 0857   BUN 15 10/19/2015 0905   BUN 15 06/20/2014 1303   CREATININE 1.28 (H) 05/27/2023 0857   CREATININE 0.99 06/20/2014 1303   CALCIUM 9.2 05/27/2023 0857   CALCIUM 8.6 06/20/2014 1303   PROT 6.7 05/27/2023 0857   PROT 6.8 10/19/2015 0905   ALBUMIN 4.2 05/27/2023 0857   ALBUMIN 4.4 10/19/2015 0905   AST 15 05/27/2023 0857   ALT 11 05/27/2023 0857   ALKPHOS 66 05/27/2023 0857   BILITOT 0.4 05/27/2023 0857   BILITOT 0.4 10/19/2015 0905   GFR 44.05 (L) 05/27/2023 0857   GFRNONAA 58 (L) 10/19/2015 0905   GFRNONAA >60 06/20/2014 1303   Concerned about GFR  Stream is not very strong after vulvar surgery   Drinking more water more than 60 oz per day  No nsaids Takes tylenol   Results for orders placed or performed in visit on 05/30/23  POCT Urinalysis Dipstick (Automated)  Result Value Ref Range   Color, UA Yellow    Clarity, UA Clear    Glucose, UA Negative Negative   Bilirubin, UA Negeative    Ketones, UA Negative    Spec Grav, UA 1.015 1.010 - 1.025   Blood, UA Negative    pH, UA 5.5 5.0 - 8.0   Protein, UA Negative Negative   Urobilinogen, UA 0.2 0.2 or 1.0 E.U./dL   Nitrite, UA Negative    Leukocytes, UA Negative Negative  POCT UA - Microscopic Only  Result Value Ref Range   WBC, Ur, HPF, POC 0-1 0 - 5   RBC, Urine, Miroscopic 0 0 - 2   Bacteria, U Microscopic 0 None - Trace   Mucus, UA few    Epithelial cells, urine per micros few    Crystals, Ur, HPF, POC none    Casts, Ur, LPF, POC none    Yeast, UA  none  Glucose Lab Results  Component Value Date   HGBA1C 5.7 05/27/2023   This is stable   Patient Active Problem List   Diagnosis Date Noted   Bradycardia 05/30/2023   Melanoma in situ (HCC) 12/25/2022   ASCUS of cervix with negative high risk HPV 06/25/2022   Osteoarthritis of knee 05/25/2022   Hemorrhoid 04/25/2022   Estrogen deficiency 01/02/2022   Bipolar disorder, in partial remission, most recent episode mixed (HCC) 02/25/2020   Elevated glucose level 03/03/2018   Atrophic vaginitis 07/08/2017   Cervical stenosis (uterine cervix) 01/03/2017   Hearing loss 10/25/2015   Routine general medical examination at a health care facility 10/17/2015   Decreased GFR 10/22/2014   Colon cancer screening 10/16/2013   METATARSALGIA 12/01/2009   Enthesopathy of ankle and tarsus 12/01/2009   POSTMENOPAUSAL STATUS 09/30/2009   ARTHRITIS, RIGHT HIP 03/10/2009   DEGENERATIVE DISC DISEASE, LUMBAR SPINE 03/10/2009   DDD (degenerative disc disease), lumbosacral 03/10/2009   ONYCHOMYCOSIS 09/17/2008   Hypothyroidism 08/05/2007   Morbid obesity (HCC) 08/05/2007   Bipolar disorder (HCC) 08/05/2007   ALLERGIC RHINITIS 08/05/2007   IBS 08/05/2007   GERD (gastroesophageal reflux disease) 08/05/2007   HYPERCHOLESTEROLEMIA, PURE 07/07/2007   Past Medical History:  Diagnosis Date   Actinic keratosis 03/29/2021   R central upper lip   Allergic rhinitis    Allergy    Anxiety    Arthritis    Atypical mole 12/26/2021   spinal upper back, exc 01/15/22   Basal cell carcinoma 10/16/2010   R lower pretibia    Bipolar 1 disorder (HCC)    Depression    Fissure, anal    GERD (gastroesophageal reflux disease)    History of basal cell carcinoma excision    ON FACE   History of squamous cell carcinoma excision    ON RIGHT LEG   Hyperlipidemia    Hypothyroidism    IBS (irritable bowel syndrome)    Liver cyst    Malignant melanoma in situ (HCC) 11/20/2022   Right labia minora.  Excision 12/25/2022 at anterior vulva, left of clitoral hood, margins involved. Surgery scheduled for 02/12/2023.   Mallet finger of left hand    small finger   Pre-diabetes    Past Surgical History:  Procedure Laterality Date   CARDIAC CATHETERIZATION  04/10/1999   per pt normal   CARDIOVASCULAR STRESS TEST  02-23-2011  dr Iantha Fallen [facility])   normal nuclear study/  no ischemia/  ef 64%   CATARACT EXTRACTION W/ INTRAOCULAR LENS  IMPLANT, BILATERAL  12/10/2001   Bil   CESAREAN SECTION  12/10/1982   CLOSED REDUCTION FINGER WITH PERCUTANEOUS PINNING Left 04/22/2014   Procedure: LEFT SMALL FINGER CLOSED REDUCTION PINNING;  Surgeon: Sharma Covert, MD;  Location: [facility];  Service: Orthopedics;  Laterality: Left;   COLONOSCOPY  11/09/2002   DILATION AND CURETTAGE OF UTERUS  1977   LAPAROSCOPIC CHOLECYSTECTOMY  03/18/2000   RECONSTRUCTION OF EYELID Bilateral 08/2022   TRANSTHORACIC ECHOCARDIOGRAM  02/23/2011   normal lvf/  ef 58%/  mild rve/   mild lae/  mild  mr  & tr/  mild pulmonary htn   VULVECTOMY N/A 12/25/2022   Procedure: PARTIAL  VULVECTOMY;  Surgeon: [name], MD;  Location: [facility];  Service: Gynecology;  Laterality: N/A;   VULVECTOMY N/A 02/12/2023   Procedure: WIDE LOCAL EXCISION OF VULVA AND REMOVAL OF CLITORIS;  Surgeon: [name], MD;  Location: [facility] ;  Service: Gynecology;  Laterality:  N/A;   WISDOM TOOTH EXTRACTION     Social History   Tobacco Use   Smoking status: Never   Smokeless tobacco: Never  Vaping Use   Vaping Use: Never used  Substance Use Topics   Alcohol use: Yes    Comment: Only on cruises or rarely   Drug use: No   Family History  Problem Relation Age of Onset   Stroke Mother    Heart disease Mother    Hypertension Father    Diabetes Father    Hyperlipidemia Father    Breast cancer Sister    Breast cancer Sister    Diabetes Brother    Colon cancer Neg Hx     Ovarian cancer Neg Hx    Endometrial cancer Neg Hx    Pancreatic cancer Neg Hx    Prostate cancer Neg Hx    Allergies  Allergen Reactions   Ingrezza [Valbenazine Tosylate] Other (See Comments)    Night sweats, worsened tardive dyskinesia, nightmares   Lidocaine Swelling    REACTION: swelling ALL CAINE DRUGS EXCEPT: marcaine & sensercaine Other reaction(s): Edema   Xylocaine [Lidocaine Hcl] Swelling    ALLERGIC TO ALL CAINES EXCEPT SENSORCAINE , AND MARCAINE   Amantadine    Austedo Xr [Deutetrabenazine]     Caused Muscle aches and tremors   Codeine Nausea And Vomiting    REACTION: vomits blood   Lithium Swelling   Tegretol [Carbamazepine] Other (See Comments)    REACTION: skin crawls   Current Outpatient Medications on File Prior to Visit  Medication Sig Dispense Refill   aspirin EC 81 MG tablet Take 81 mg by mouth daily. Swallow whole.     BIOTIN PO Take 1 tablet by mouth daily.     buPROPion (WELLBUTRIN XL) 150 MG 24 hr tablet Take 300 mg by mouth daily. Take three tablets by mouth every morning     cetirizine (ZYRTEC ALLERGY) 10 MG tablet Take 10 mg by mouth daily as needed for allergies.     Cholecalciferol (VITAMIN D) 2000 UNITS CAPS Take 1 capsule by mouth daily.     clonazePAM (KLONOPIN) 1 MG tablet Take 1 mg by mouth at bedtime.     cyanocobalamin (VITAMIN B12) 1000 MCG tablet Take by mouth.     famotidine (PEPCID) 20 MG tablet Take 1 tablet (20 mg total) by mouth 2 (two) times daily. 180 tablet 0   fluticasone (FLONASE) 50 MCG/ACT nasal spray Place into the nose.     hydrocortisone (ANUSOL-HC) 2.5 % rectal cream Apply 1 application. topically 2 (two) times daily. To affected area twice daily (around anus/ hemorrhoid) 30 g 0   lamoTRIgine (LAMICTAL) 100 MG tablet Take 150 mg by mouth daily.     levothyroxine (SYNTHROID) 50 MCG tablet TAKE 1 TABLET EVERY DAY BEFORE BREAKFAST 90 tablet 0   lovastatin (MEVACOR) 40 MG tablet TAKE 1 TABLET EVERY DAY 90 tablet 0    metroNIDAZOLE (METROGEL) 1 % gel Apply topically daily. To nose, cheeks for rosacea 45 g 2   nystatin (MYCOSTATIN/NYSTOP) powder Apply 1 Application topically 3 (three) times daily. Under abdomen skin fold 15 g 2   Probiotic Product (PROBIOTIC ADVANCED PO) Take by mouth.     tiZANidine (ZANAFLEX) 4 MG tablet Take 1 tablet by mouth at bedtime as needed.  0   zolpidem (AMBIEN) 10 MG tablet Take 10 mg by mouth at bedtime.     No current facility-administered medications on file prior to visit.    Review of Systems  Objective:   Physical Exam Constitutional:      General: She is not in acute distress.    Appearance: Normal appearance. She is well-developed. She is obese. She is not ill-appearing or diaphoretic.  HENT:     Head: Normocephalic and atraumatic.     Right Ear: Tympanic membrane, ear canal and external ear normal.     Left Ear: Tympanic membrane, ear canal and external ear normal.     Nose: Nose normal. No congestion.     Mouth/Throat:     Mouth: Mucous membranes are moist.     Pharynx: Oropharynx is clear. No posterior oropharyngeal erythema.  Eyes:     General: No scleral icterus.    Extraocular Movements: Extraocular movements intact.     Conjunctiva/sclera: Conjunctivae normal.     Pupils: Pupils are equal, round, and reactive to light.  Neck:     Thyroid: No thyromegaly.     Vascular: No carotid bruit or JVD.  Cardiovascular:     Rate and Rhythm: Regular rhythm. Bradycardia present.     Pulses: Normal pulses.     Heart sounds: Normal heart sounds.     No gallop.  Pulmonary:     Effort: Pulmonary effort is normal. No respiratory distress.     Breath sounds: Normal breath sounds. No wheezing.     Comments: Good air exch Chest:     Chest wall: No tenderness.  Abdominal:     General: Bowel sounds are normal. There is no distension or abdominal bruit.     Palpations: Abdomen is soft. There is no mass.     Tenderness: There is no abdominal tenderness.      Hernia: No hernia is present.  Genitourinary:    Comments: Breast exam: No mass, nodules, thickening, tenderness, bulging, retraction, inflamation, nipple discharge or skin changes noted.  No axillary or clavicular LA.     Musculoskeletal:        General: No tenderness. Normal range of motion.     Cervical back: Normal range of motion and neck supple. No rigidity. No muscular tenderness.     Right lower leg: No edema.     Left lower leg: No edema.     Comments: No kyphosis   Lymphadenopathy:     Cervical: No cervical adenopathy.  Skin:    General: Skin is warm and dry.     Coloration: Skin is not pale.     Findings: No erythema or rash.     Comments: 2 mm soft (fatty feeling) skin lump on right nasal bridge - no color or scale  Solar lentigines diffusely   Neurological:     Mental Status: She is alert. Mental status is at baseline.     Cranial Nerves: No cranial nerve deficit.     Motor: No abnormal muscle tone.     Coordination: Coordination normal.     Gait: Gait normal.     Deep Tendon Reflexes: Reflexes are normal and symmetric. Reflexes normal.  Psychiatric:        Mood and Affect: Mood normal.        Cognition and Memory: Cognition and memory normal.     Comments: Candidly discusses symptoms and stressors             Assessment & Plan:   Problem List Items Addressed This Visit       Digestive   GERD (gastroesophageal reflux disease)    Taking pepcid bid Encouraged avoiding dietary triggers  Encouraged weight loss  Endocrine   Hypothyroidism    Hypothyroidism  Pt has no clinical changes No change in energy level/ hair or skin/ edema and no tremor Lab Results  Component Value Date   TSH 2.98 05/27/2023    Plan to continue levothyroxine 50 mcg daily          Musculoskeletal and Integument   Osteoarthritis of knee    Ref to ortho for further eval/tx      Relevant Orders   Ambulatory referral to Orthopedic Surgery   DDD (degenerative disc  disease), lumbosacral    Ref to ortho for further eval/tx      Relevant Orders   Ambulatory referral to Orthopedic Surgery     Other   Routine general medical examination at a health care facility - Primary    Reviewed health habits including diet and exercise and skin cancer prevention Reviewed appropriate screening tests for age  Also reviewed health mt list, fam hx and immunization status , as well as social and family history   See HPI Labs reviewed and ordered Sent for date of 2nd shingrix vaccine Prevnar 20 vaccine given today  Mammo utd 11/2022 Colonoscopy utd 05/2015 Dexa ordered/ no falls or fracture, discussed bone health Utd gyn care, had vulvar surg for melanoma in situ PHQ 0  Seeing derm frequently for vulvar cancer,-will follow up for fleshy spot on face  Encouraged to continue sun protection         Morbid obesity (HCC)    Discussed how this problem influences overall health and the risks it imposes  Reviewed plan for weight loss with lower calorie diet (via better food choices and also portion control or program like weight watchers) and exercise building up to or more than 30 minutes 5 days per week including some aerobic activity   Encouraged strength training when able        Melanoma in situ [facility])    Recent vulvar surgery  Bx upcoming for some more cells        HYPERCHOLESTEROLEMIA, PURE    Disc goals for lipids and reasons to control them Rev last labs with pt Rev low sat fat diet in detail LDL is up since changing lovastatin dose to am Will change this back to evening Diet is good      Estrogen deficiency    Dexa ordered      Relevant Orders   DG Bone Density   Elevated glucose level    Lab Results  Component Value Date   HGBA1C 5.7 05/27/2023  disc imp of low glycemic diet and wt loss to prevent DM2  Doing well      Decreased GFR    GFR down to 44.05  Good water intake  No nsaids  Ua today is entirely clear  Consider ref to  nephrology       Relevant Orders   POCT Urinalysis Dipstick (Automated) (Completed)   POCT UA - Microscopic Only (Completed)   Colon cancer screening    Colonoscopy utd 05/2015       Bradycardia    Pulse rate of 50  Pt is not dizzy currently  No syncope or pre syncope Was more out of shape after surgery- watching for improvement in exercise intolerance  Encouraged to watch this carefully  Low threshold for cardiology ref       Bipolar disorder [facility])    Recently worse depression symptoms with vulvar cancer and surgery  Psychiatry increase her lamictal- helpful  Still sees counselor /  Bambi Cottle  PHQ 0 today Doing better       Other Visit Diagnoses     Need for pneumococcal 20-valent conjugate vaccination       Relevant Orders   Pneumococcal conjugate vaccine 20-valent (Prevnar 20) (Completed)

## 2023-05-30 NOTE — Assessment & Plan Note (Signed)
Taking pepcid bid Encouraged avoiding dietary triggers  Encouraged weight loss

## 2023-05-30 NOTE — Assessment & Plan Note (Signed)
GFR down to 44.05  Good water intake  No nsaids  Ua today is entirely clear  Consider ref to nephrology

## 2023-05-30 NOTE — Assessment & Plan Note (Signed)
Lab Results  Component Value Date   HGBA1C 5.7 05/27/2023   disc imp of low glycemic diet and wt loss to prevent DM2  Doing well

## 2023-05-30 NOTE — Assessment & Plan Note (Signed)
Colonoscopy utd 05/2015

## 2023-05-30 NOTE — Assessment & Plan Note (Signed)
Ref to ortho for further eval/tx 

## 2023-05-30 NOTE — Assessment & Plan Note (Signed)
Disc goals for lipids and reasons to control them Rev last labs with pt Rev low sat fat diet in detail LDL is up since changing lovastatin dose to am Will change this back to evening Diet is good

## 2023-05-30 NOTE — Telephone Encounter (Signed)
Patient called in and stated that she would like to go to [facility] in [place] and see Dr. Thedore Mins. Thank you!

## 2023-05-30 NOTE — Addendum Note (Signed)
Addended by: Roxy Manns A on: 05/30/2023 04:08 PM   Modules accepted: Orders

## 2023-05-31 ENCOUNTER — Encounter: Payer: Self-pay | Admitting: *Deleted

## 2023-06-03 ENCOUNTER — Inpatient Hospital Stay: Payer: Medicare HMO | Attending: Gynecologic Oncology | Admitting: Psychiatry

## 2023-06-03 VITALS — BP 139/74 | HR 63 | Temp 98.4°F | Wt 228.0 lb

## 2023-06-03 DIAGNOSIS — C519 Malignant neoplasm of vulva, unspecified: Secondary | ICD-10-CM

## 2023-06-03 DIAGNOSIS — D038 Melanoma in situ of other sites: Secondary | ICD-10-CM | POA: Diagnosis not present

## 2023-06-03 DIAGNOSIS — Z8544 Personal history of malignant neoplasm of other female genital organs: Secondary | ICD-10-CM | POA: Insufficient documentation

## 2023-06-03 DIAGNOSIS — Z9079 Acquired absence of other genital organ(s): Secondary | ICD-10-CM | POA: Insufficient documentation

## 2023-06-03 DIAGNOSIS — L859 Epidermal thickening, unspecified: Secondary | ICD-10-CM | POA: Diagnosis not present

## 2023-06-03 DIAGNOSIS — R001 Bradycardia, unspecified: Secondary | ICD-10-CM | POA: Diagnosis not present

## 2023-06-03 NOTE — Progress Notes (Unsigned)
Gynecologic Oncology Return Clinic Visit  Date of Service: 06/03/2023 Referring Provider: Nicholaus Bloom, MD   Assessment & Plan: [name] [name] is a 67 y.o. woman with vulvar melanoma in situ who is s/p wide local excision of the vulva on 12/25/22, followed by re-excision and removal of clitoris on 02/12/23, who presents today for surveillance.  Melanoma in situ: - Focal positive deep margin (one focal spot on 1 single slide). All other margins negative for MIS but positive for atypical lentiginous melanocytic hyperplasia (possible precursor lesion). - NED grossly on exam today. - Two scouting biopsies obtained today. See procedure note below. Will follow-up results. - Continue surveillance with pelvic exam q68mo with scouting biopsies. If skin changes, positive biopsies, could consider treatment with aldara.  RTC 1mo  [name], MD Gynecologic Oncology   Medical Decision Making I personally spent  TOTAL 25 minutes face-to-face and non-face-to-face in the care of this patient, which includes all pre, intra, and post visit time on the date of service.   ----------------------- Reason for Visit: Surveillance  Treatment History: Pt presented to her Ob/Gyn for evaluation of vulvar itching. She had noted itching the week prior, but this resolved with application of clobetasol which she had for a diagnosis of lichen simplex chronicus. However, patient had also been previously noted by another provider to have a brown vulvar lesions of her right labia minora 12/2021 which was rechecked 03/2022 and noted to be "stable" with plan for continued yearly follow-up.   On 11/12/22, her Ob/Gyn noted a 1cm brown lesion was noted "at the top of her labia minora." Given the appearance of a melanotic lesion, she was recommended to return for a biopsy, which was completed on 11/20/22. Biopsy returned with melanoma in situ, edges involved. No invasive disease was seen.   She underwent a simple partial  vulvectomy on 12/25/22 with path results: A. ANTERIOR VULVA, EXCISION: MELANOMA IN SITU, EXTENDING TO THE DEEP  SURGICAL MARGIN. THE PERIPHERAL MARGINS ARE NEGATIVE.  B. ANTERIOR VULVA, LEFT OF CLITORAL HOOD, EXCISION:  MELANOMA IN SITU  INVOLVING THE 12, 3, 6 AND 9 O'CLOCK MARGINS.   Given multiple positive margins, she returned to the OR on 02/12/23 for additional resection including a WLE of the anterior vulva including removal of remaining bilateral labia minora and removal of clitoris with pathology showing: A.   VULVA, ANTERIOR MARGIN, BIOPSY: NO MELANOMA IN SITU SEEN. COMMENT: Dr. Maryelizabeth Kaufmann is in agreement. B.   VULVA, LEFT ANTERIOR MARGIN, BIOPSY: NO MELANOMA IN SITU SEEN. COMMENT: Dr. Maryelizabeth Kaufmann is in agreement. C.   VULVA, LEFT MEDIAL MARGIN, BIOPSY: NO MELANOMA IN SITU SEEN. COMMENT: MelanA was used to evaluate this slide. Dr. Maryelizabeth Kaufmann is in agreement D.   **PRELIMINARY DIAGNOSIS** VULVA, ANTERIOR WITH LEFT/RIGHT LABIA MINORA, EXCISION: -RESIDUAL MELANOMA IN SITU , MULTFOCAL RESIDUAL FOCI , INVOLVING BLACK/DEEP MARGIN (D2), -BACKGROUND ATYPICAL JUNCTIONAL MELANOCYTIC HYPERPLASIA INVOLVING MULTIFOCAL MARGINS (ANTERIOR, OUTER LEFT, INNER LEFT MARGINS INVOLVED WITH ATYPICAL JUNCTIONAL MELANOCYTIC HYPERPLASIA, SEE COMMENT) COMMENT: Sections show extensive areas of atypical lentiginous melanocytic hyperplasia involving the margins noted above, which may be a precursor lesion to the residual melanoma in situ present. The atypical lentiginous melanocytic hyperplasia does not meet criteria for melanoma in situ. These atypical lentiginous foci are negative with PRAME, and exhibit lesser cytological atypia on MITF staining. MelanA overstains many foci and is not contributory. However, residual melanoma in situ with focal upward (pagetoid) growth, severe atypia of melanocytes, and melanocytic confluence is seen in multifocal areas, but notably ONLY  at the margin deep inked black (block D2). The  remainder of the margins are free of melanoma in situ.  E.   VULVA, LEFT LATERAL MARGIN, EXCISION: ATYPICAL JUNCTIONAL MELANOCYTIC HYPERPLASIA, NO MELANOMA IN SITU COMMENT: No melanoma in situ is seen. PRAME, MelanA, and MITF were used to evaluate this margin. Dr. Maryelizabeth Kaufmann has reviewed this case and is in agreement with the diagnoses rendered above. In managing this patient with extensive atypical lentiginous melanocytic hyperplasia, which likely represents a confluence of extensive melanotic macules and precursor disease to melanoma in situ, this reference article is provided: Charlcie Cradle. May P. [name], Shitanshu Uppai: Margin status in vulvovaginal melanoma: Management and oncologic outcomes of 50 cases. Gynecol Oncol Rep. 2023 Oct; 49: [id number]    Interval History: Today she presents with her husband. She reports left ankle swelling more than right for about 1 week. Told her pcp about it but no further workup at this time other than referral to nephrology in the setting of rise in creatinine.   No vulvar itching or bleeding. No new lesions she is aware of.   Past Medical/Surgical History: Past Medical History:  Diagnosis Date   Actinic keratosis 03/29/2021   R central upper lip   Allergic rhinitis    Allergy    Anxiety    Arthritis    Atypical mole 12/26/2021   spinal upper back, exc 01/15/22   Basal cell carcinoma 10/16/2010   R lower pretibia    Bipolar 1 disorder (HCC)    Depression    Fissure, anal    GERD (gastroesophageal reflux disease)    History of basal cell carcinoma excision    ON FACE   History of squamous cell carcinoma excision    ON RIGHT LEG   Hyperlipidemia    Hypothyroidism    IBS (irritable bowel syndrome)    Liver cyst    Malignant melanoma in situ (HCC) 11/20/2022   Right labia minora. Excision 12/25/2022 at anterior vulva, left of clitoral hood, margins involved. Surgery scheduled for 02/12/2023.   Mallet finger of left hand    small finger    Pre-diabetes     Past Surgical History:  Procedure Laterality Date   CARDIAC CATHETERIZATION  04/10/1999   per pt normal   CARDIOVASCULAR STRESS TEST  02-23-2011  dr Iantha Fallen fath [facility])   normal nuclear study/  no ischemia/  ef 64%   CATARACT EXTRACTION W/ INTRAOCULAR LENS  IMPLANT, BILATERAL  12/10/2001   Bil   CESAREAN SECTION  12/10/1982   CLOSED REDUCTION FINGER WITH PERCUTANEOUS PINNING Left 04/22/2014   Procedure: LEFT SMALL FINGER CLOSED REDUCTION PINNING;  Surgeon: Sharma Covert, MD;  Location: [facility];  Service: Orthopedics;  Laterality: Left;   COLONOSCOPY  11/09/2002   DILATION AND CURETTAGE OF UTERUS  1977   LAPAROSCOPIC CHOLECYSTECTOMY  03/18/2000   RECONSTRUCTION OF EYELID Bilateral 08/2022   TRANSTHORACIC ECHOCARDIOGRAM  02/23/2011   normal lvf/  ef 58%/  mild rve/   mild lae/  mild  mr  & tr/  mild pulmonary htn   VULVECTOMY N/A 12/25/2022   Procedure: PARTIAL  VULVECTOMY;  Surgeon: [name], MD;  Location: [facility] [place];  Service: Gynecology;  Laterality: N/A;   VULVECTOMY N/A 02/12/2023   Procedure: WIDE LOCAL EXCISION OF VULVA AND REMOVAL OF CLITORIS;  Surgeon: [name], MD;  Location: [facility] [place];  Service: Gynecology;  Laterality: N/A;   WISDOM TOOTH EXTRACTION      Family  History  Problem Relation Age of Onset   Stroke Mother    Heart disease Mother    Hypertension Father    Diabetes Father    Hyperlipidemia Father    Breast cancer Sister    Breast cancer Sister    Diabetes Brother    Colon cancer Neg Hx    Ovarian cancer Neg Hx    Endometrial cancer Neg Hx    Pancreatic cancer Neg Hx    Prostate cancer Neg Hx     Social History   Socioeconomic History   Marital status: Married    Spouse name: Not on file   Number of children: 1   Years of education: Not on file   Highest education level: Not on file  Occupational History   Occupation: Home    Employer: RETIRED   Tobacco Use   Smoking status: Never   Smokeless tobacco: Never  Vaping Use   Vaping Use: Never used  Substance and Sexual Activity   Alcohol use: Yes    Comment: Only on cruises or rarely   Drug use: No   Sexual activity: Not Currently    Birth control/protection: Post-menopausal  Other Topics Concern   Not on file  Social History Narrative   Moved here from [place]   Social Determinants of Health   Financial Resource Strain: Low Risk  (05/27/2023)   Overall Financial Resource Strain (CARDIA)    Difficulty of Paying Living Expenses: Not hard at all  Food Insecurity: No Food Insecurity (05/27/2023)   Hunger Vital Sign    Worried About Running Out of Food in the Last Year: Never true    Ran Out of Food in the Last Year: Never true  Transportation Needs: No Transportation Needs (05/27/2023)   PRAPARE - Administrator, Civil Service (Medical): No    Lack of Transportation (Non-Medical): No  Physical Activity: Sufficiently Active (05/27/2023)   Exercise Vital Sign    Days of Exercise per Week: 4 days    Minutes of Exercise per Session: 50 min  Stress: No Stress Concern Present (05/27/2023)   [employer] of Occupational Health - Occupational Stress Questionnaire    Feeling of Stress : Not at all  Social Connections: Moderately Integrated (05/27/2023)   Social Connection and Isolation Panel [NHANES]    Frequency of Communication with Friends and Family: More than three times a week    Frequency of Social Gatherings with Friends and Family: More than three times a week    Attends Religious Services: More than 4 times per year    Active Member of [employer] or Organizations: No    Attends Banker Meetings: Never    Marital Status: Married    Current Medications:  Current Outpatient Medications:    aspirin EC 81 MG tablet, Take 81 mg by mouth daily. Swallow whole., Disp: , Rfl:    BIOTIN PO, Take 1 tablet by mouth daily., Disp: , Rfl:    buPROPion (WELLBUTRIN  XL) 150 MG 24 hr tablet, Take 300 mg by mouth daily. Take three tablets by mouth every morning, Disp: , Rfl:    cetirizine (ZYRTEC ALLERGY) 10 MG tablet, Take 10 mg by mouth daily as needed for allergies., Disp: , Rfl:    Cholecalciferol (VITAMIN D) 2000 UNITS CAPS, Take 1 capsule by mouth daily., Disp: , Rfl:    clonazePAM (KLONOPIN) 1 MG tablet, Take 1 mg by mouth at bedtime., Disp: , Rfl:    cyanocobalamin (VITAMIN B12) 1000 MCG  tablet, Take by mouth., Disp: , Rfl:    famotidine (PEPCID) 20 MG tablet, Take 1 tablet (20 mg total) by mouth 2 (two) times daily., Disp: 180 tablet, Rfl: 0   fluticasone (FLONASE) 50 MCG/ACT nasal spray, Place into the nose., Disp: , Rfl:    hydrocortisone (ANUSOL-HC) 2.5 % rectal cream, Apply 1 application. topically 2 (two) times daily. To affected area twice daily (around anus/ hemorrhoid), Disp: 30 g, Rfl: 0   lamoTRIgine (LAMICTAL) 100 MG tablet, Take 150 mg by mouth daily., Disp: , Rfl:    levothyroxine (SYNTHROID) 50 MCG tablet, TAKE 1 TABLET EVERY DAY BEFORE BREAKFAST, Disp: 90 tablet, Rfl: 0   lovastatin (MEVACOR) 40 MG tablet, TAKE 1 TABLET EVERY DAY, Disp: 90 tablet, Rfl: 0   metroNIDAZOLE (METROGEL) 1 % gel, Apply topically daily. To nose, cheeks for rosacea, Disp: 45 g, Rfl: 2   nystatin (MYCOSTATIN/NYSTOP) powder, Apply 1 Application topically 3 (three) times daily. Under abdomen skin fold, Disp: 15 g, Rfl: 2   Probiotic Product (PROBIOTIC ADVANCED PO), Take by mouth., Disp: , Rfl:    tiZANidine (ZANAFLEX) 4 MG tablet, Take 1 tablet by mouth at bedtime as needed., Disp: , Rfl: 0   zolpidem (AMBIEN) 10 MG tablet, Take 10 mg by mouth at bedtime., Disp: , Rfl:   Review of Symptoms: Complete 10-system review is positive for: joint pain, easy bruising, depression  Physical Exam: BP 139/74   Pulse 48-50 on manual recheck   Temp 98.4 F (36.9 C)   Wt 228 lb (103.4 kg)   SpO2 99%   BMI 44.16 kg/m  General: Alert, oriented, no acute distress. HEENT:  Normocephalic, atraumatic. Neck symmetric without masses. Sclera anicteric.  Chest: Normal work of breathing. Clear to auscultation bilaterally.   Cardiovascular: Regular rhythm. Heart rate in 30s on initial check. Repeat check around 48-50.  Abdomen: Soft, nontender.  Extremities: Grossly normal range of motion.  Warm, well perfused.  Edema bilateral ankles Skin: No rashes or lesions noted. Lymphatics: No inguinal adenopathy. GU: External genitalia with evidence of prior anterior vulvectomy, well healed. No evidence of active lesion. Scouting biopsies obtained, see below. Speculum exam with normal cervix  Bimanual exam reveals normal cervix, mobile uterus, no pelvic mass.  Exam chaperoned by Andrey Cota, RN  VULVAR BIOPSY  SHERETTA GRUMBINE is a 67 y.o. woman who presents today for a vulvar biopsy, see details of the visit above.  The procedure was explained to the patient and verbal consent was obtained prior to the procedure.  The skin was cleaned with Betadine.  1 ml of 0.25% marcaine was injected at the planned biopsy site for scouting biopsies.  A 3 mm punch biopsy was used to obtain one scouting biopsy left anterior along vulvectomy scar, and one biopsy right inferior where the right labia minora was excised.  Hemostasis was achieved with silver nitrate.  Patient tolerated the procedure well.   Laboratory & Radiologic Studies: None

## 2023-06-03 NOTE — Patient Instructions (Signed)
It was a pleasure to see you in clinic today. - I will let you know what the biopsies show. - Recommend that you call your cardiologist and make sure they are aware that your PCP office and our office found your heart rate in the 30s but on recheck upper 40s to around 50. - Return visit planned for 3 months unless needed sooner  Thank you very much for allowing me to provide care for you today.  I appreciate your confidence in choosing our Gynecologic Oncology team at [facility].  If you have any questions about your visit today please call our office or send [place] a MyChart message and we will get back to you as soon as possible.

## 2023-06-04 ENCOUNTER — Encounter: Payer: Self-pay | Admitting: Psychiatry

## 2023-06-04 DIAGNOSIS — R001 Bradycardia, unspecified: Secondary | ICD-10-CM | POA: Diagnosis not present

## 2023-06-05 DIAGNOSIS — M1711 Unilateral primary osteoarthritis, right knee: Secondary | ICD-10-CM | POA: Diagnosis not present

## 2023-06-05 DIAGNOSIS — M5416 Radiculopathy, lumbar region: Secondary | ICD-10-CM | POA: Diagnosis not present

## 2023-06-05 LAB — SURGICAL PATHOLOGY

## 2023-06-07 DIAGNOSIS — R42 Dizziness and giddiness: Secondary | ICD-10-CM | POA: Diagnosis not present

## 2023-06-07 DIAGNOSIS — R0989 Other specified symptoms and signs involving the circulatory and respiratory systems: Secondary | ICD-10-CM | POA: Diagnosis not present

## 2023-06-07 DIAGNOSIS — I4892 Unspecified atrial flutter: Secondary | ICD-10-CM | POA: Diagnosis not present

## 2023-06-09 DIAGNOSIS — Z8544 Personal history of malignant neoplasm of other female genital organs: Secondary | ICD-10-CM | POA: Diagnosis not present

## 2023-06-09 DIAGNOSIS — Z9079 Acquired absence of other genital organ(s): Secondary | ICD-10-CM | POA: Diagnosis not present

## 2023-06-12 ENCOUNTER — Ambulatory Visit: Payer: Medicare HMO | Admitting: Dermatology

## 2023-06-12 DIAGNOSIS — I4892 Unspecified atrial flutter: Secondary | ICD-10-CM | POA: Diagnosis not present

## 2023-06-17 DIAGNOSIS — R001 Bradycardia, unspecified: Secondary | ICD-10-CM | POA: Diagnosis not present

## 2023-06-19 DIAGNOSIS — I272 Pulmonary hypertension, unspecified: Secondary | ICD-10-CM | POA: Diagnosis not present

## 2023-06-19 DIAGNOSIS — I4892 Unspecified atrial flutter: Secondary | ICD-10-CM | POA: Diagnosis not present

## 2023-06-19 DIAGNOSIS — I4819 Other persistent atrial fibrillation: Secondary | ICD-10-CM | POA: Diagnosis not present

## 2023-06-20 ENCOUNTER — Other Ambulatory Visit: Payer: Self-pay | Admitting: Family Medicine

## 2023-06-25 DIAGNOSIS — Z01810 Encounter for preprocedural cardiovascular examination: Secondary | ICD-10-CM | POA: Diagnosis not present

## 2023-06-25 DIAGNOSIS — I4892 Unspecified atrial flutter: Secondary | ICD-10-CM | POA: Diagnosis not present

## 2023-06-25 DIAGNOSIS — I340 Nonrheumatic mitral (valve) insufficiency: Secondary | ICD-10-CM | POA: Diagnosis not present

## 2023-06-25 DIAGNOSIS — Z7901 Long term (current) use of anticoagulants: Secondary | ICD-10-CM | POA: Diagnosis not present

## 2023-06-26 ENCOUNTER — Ambulatory Visit: Payer: Medicare HMO | Admitting: Psychology

## 2023-06-26 DIAGNOSIS — I4892 Unspecified atrial flutter: Secondary | ICD-10-CM | POA: Insufficient documentation

## 2023-06-26 DIAGNOSIS — I480 Paroxysmal atrial fibrillation: Secondary | ICD-10-CM | POA: Diagnosis not present

## 2023-06-26 DIAGNOSIS — Z885 Allergy status to narcotic agent status: Secondary | ICD-10-CM | POA: Diagnosis not present

## 2023-06-26 DIAGNOSIS — E039 Hypothyroidism, unspecified: Secondary | ICD-10-CM | POA: Diagnosis not present

## 2023-06-26 DIAGNOSIS — Z79890 Hormone replacement therapy (postmenopausal): Secondary | ICD-10-CM | POA: Diagnosis not present

## 2023-06-26 DIAGNOSIS — Z6841 Body Mass Index (BMI) 40.0 and over, adult: Secondary | ICD-10-CM | POA: Diagnosis not present

## 2023-06-26 DIAGNOSIS — Z888 Allergy status to other drugs, medicaments and biological substances status: Secondary | ICD-10-CM | POA: Diagnosis not present

## 2023-06-26 DIAGNOSIS — Z7901 Long term (current) use of anticoagulants: Secondary | ICD-10-CM | POA: Diagnosis not present

## 2023-06-26 DIAGNOSIS — I483 Typical atrial flutter: Secondary | ICD-10-CM | POA: Diagnosis not present

## 2023-06-27 DIAGNOSIS — Z885 Allergy status to narcotic agent status: Secondary | ICD-10-CM | POA: Diagnosis not present

## 2023-06-27 DIAGNOSIS — E039 Hypothyroidism, unspecified: Secondary | ICD-10-CM | POA: Diagnosis not present

## 2023-06-27 DIAGNOSIS — I480 Paroxysmal atrial fibrillation: Secondary | ICD-10-CM | POA: Insufficient documentation

## 2023-06-27 DIAGNOSIS — I483 Typical atrial flutter: Secondary | ICD-10-CM | POA: Diagnosis not present

## 2023-06-27 DIAGNOSIS — Z7901 Long term (current) use of anticoagulants: Secondary | ICD-10-CM | POA: Diagnosis not present

## 2023-06-27 DIAGNOSIS — Z888 Allergy status to other drugs, medicaments and biological substances status: Secondary | ICD-10-CM | POA: Diagnosis not present

## 2023-06-27 DIAGNOSIS — Z79890 Hormone replacement therapy (postmenopausal): Secondary | ICD-10-CM | POA: Diagnosis not present

## 2023-06-27 DIAGNOSIS — Z6841 Body Mass Index (BMI) 40.0 and over, adult: Secondary | ICD-10-CM | POA: Diagnosis not present

## 2023-06-27 DIAGNOSIS — I4892 Unspecified atrial flutter: Secondary | ICD-10-CM | POA: Diagnosis not present

## 2023-06-28 ENCOUNTER — Ambulatory Visit (INDEPENDENT_AMBULATORY_CARE_PROVIDER_SITE_OTHER): Payer: Medicare HMO | Admitting: Psychology

## 2023-06-28 DIAGNOSIS — F3181 Bipolar II disorder: Secondary | ICD-10-CM

## 2023-06-28 NOTE — Progress Notes (Signed)
[facility] Counselor/Therapist Progress Note  Patient ID: [name] [name], MRN: [id number],    Date: 06/28/2023  Time Spent: 55 minutes  Time in: 1:06 Time out : 2:01  Treatment Type: Individual Therapy  Reported Symptoms: none  Mental Status Exam: Appearance:  Casual     Behavior: Appropriate  Motor: Normal  Speech/Language:  normal  Affect: Appropriate  Mood: pleasant  Thought process: normal  Thought content:   WNL  Sensory/Perceptual disturbances:   WNL  Orientation: oriented to person, place, time/date, and situation  Attention: Good  Concentration: Good  Memory: WNL  Fund of knowledge:  Good  Insight:   Good  Judgment:  Good  Impulse Control: Good   Risk Assessment: Danger to Self:  No Self-injurious Behavior: No Danger to Others: No Duty to Warn:no Physical Aggression / Violence:No  Access to Firearms a concern: No  Gang Involvement:No   Subjective: The patient attended a face-to-face individual therapy session via video visit today.  The patient agreed and consented for this session to be on caregility and patient is aware of limitations of telehealth.  The patient was in her home alone and the therapist was in the office.  The patient presents with a blunted affect and mood is pleasant.  The patient reports that she has had a difficult 2 weeks.  She reports that she has had to have an ablation and that she was in a place where she could have potentially had a stroke.  We talked about the situation and how she was able to get to where she needed to go before something bad really happened.  We talked about being more positive and she cannot exercise for a couple of weeks but I also recommended that she do some work around meditation and mindfulness to help her keep herself calm.  The patient has a history of being relatively anxious and I think some of her anxiety probably helped contribute to some of her heart issues.  Encouraged the patient to  relax and take care of herself.  Interventions: Cognitive Behavioral Therapy, meditation and mindfulness  Diagnosis:Bipolar II disorder (HCC)  Plan: Client Abilities/Strengths  intelligent, insightful  Client Treatment Preferences  Outpatient individual therapy  Client Statement of Needs  "I need someone to help monitor and manage my bipolar"  Treatment Level  Outpatient individual therapy  Symptoms  Depressed or irritable mood.: (Status: improved). Diminished interest in or  enjoyment of activities.: (Status: improved). Displays a poor attention span and is easily distracted.:  (Status: improved). Exhibits an abnormally and  persistently elevated, expansive, or irritable mood with at least three symptoms of mania (i.e., inflated  self-esteem or grandiosity, decreased need for sleep, pressured speech, flight of ideas, distractibility,  excessive goal-directed activity or psychomotor agitation, excessive involvement in pleasurable, highrisk behavior).:  (Status: improved). Feelings of hopelessness, worthlessness, or inappropriate guilt.: (Status: improved). History of at least one hypomanic,  manic, or mixed mood episode.:  (Status: maintained). History of chronic or  recurrent depression for which the client has taken antidepressant medication, been hospitalized, had  outpatient treatment, or had a course of electroconvulsive therapy.:  (Status:  maintained). Lack of energy.: (Status: improved). Lacks follow-through in  projects, even though energy is very high, since behavior lacks discipline and goal-directedness.:  (Status: improved). Poor concentration and indecisiveness.:  (Status: improved). Reports flight of ideas or thoughts racing.: (Status: improved). Shows evidence of a decreased need for sleep.: (Status: improved).  Sleeplessness or hypersomnia.:  (Status: improved).  Social withdrawal.: (Status: improved). Suicidal thoughts and/or gestures.:   (Status: improved). The elevated  mood or irritability (mania) causes marked impairment in  occupational functioning, social activities, or relationships with others.: (Status: improved). Verbalizes grandiose ideas and/or persecutory beliefs.:  (Status: improved).  Problems Addressed  Bipolar Disorder - Mania, Bipolar Disorder - Depression, Bipolar Disorder - Mania, Bipolar Disorder -  Mania, Bipolar Disorder - Depression, Bipolar Disorder - Depression, Bipolar Disorder - Mania  Goals 1. Achieve controlled behavior, moderated mood, more deliberative  speech and thought process, and a stable daily activity pattern. 2. Achieve controlled behavior, moderated mood, more deliberative  speech and thought process, and a stable daily activity pattern. 3. Alleviate manic/hypomanic mood and return to previous level of  effective functioning. 4. Develop healthy cognitive patterns and beliefs about self and the world  that lead to alleviation and help prevent the relapse of  manic/hypomanic episodes. Objective Maintain a pattern of regular rhythm to daily activities. Target Date: 2024-05-07 Frequency: Monthly Progress: 80 Modality: individual Related Interventions 1. Assist the client in establishing a more routine pattern of daily activities such as sleeping,  eating, solitary and social activities, and exercise; use and review a form to schedule, assess,  and modify these activities so that they occur in a predictable rhythm every day. Objective Identify and replace thoughts and behaviors that trigger manic or depressive symptoms. Target Date: 2024-05-07 Frequency: Monthly Progress: 80 Modality: individual Related Interventions 1. Use cognitive therapy techniques to explore and educate the client about cognitive biases that  trigger his/her elevated or depressive mood (see Cognitive Therapy for Bipolar Disorder by  Aggie Hacker al.). 2. Teach the client cognitive-behavioral coping and relapse prevention skills including delaying   impulsive actions, structured scheduling of daily activities, keeping a regular sleep routine,  avoiding unrealistic goal striving, using relaxation procedures, identifying and avoiding episode triggers such as stimulant drug use, alcohol consumption, breaking sleep routine, or exposing  self to high stress (see Cognitive Therapy for Bipolar Disorder by Aggie Hacker al.). 3. Use cognitive therapy techniques to explore and educate the client's about cognitive biases that  trigger his/her elevated or depressive mood (see Cognitive Therapy for Bipolar Disorder by  Aggie Hacker al.). Objective Discuss and resolve troubling personal and interpersonal issues. Target Date: 2024-05-07 Frequency: Monthly Progress: 60 Modality: individual Objective Participate in periodic "maintenance" sessions. Target Date: 2024-05-07 Frequency: Monthly Progress: 80 Modality: individual Related Interventions 1. Hold periodic "maintenance" sessions within the first few months after therapy to facilitate the  client's positive changes; problem-solve obstacles to improvement. 2. Hold periodic "maintenance" sessions within the first few months after therapy to facilitate the  client's positive changes; problem-solve obstacles to improvement. Objective Describe mood state, energy level, amount of control over thoughts, and sleeping pattern. Target Date: 2024-05-07 Frequency: Monthly Progress: 80 Modality: individual Related Interventions 1. Encourage the client to share his/her thoughts and feelings; express empathy and build rapport  while assessing primary cognitive, behavioral, interpersonal, or other symptoms of the mood  disorder. 2. Assess presence, severity, and impact of past and present mood episodes on social,  occupational, and interpersonal functioning; supplement with semi-structured inventory, if  desired (e.g., Montgomery-Asberg Depression Rating Scale, Inventory to Diagnose  Depression). 3. Encourage the  client to share his/her thoughts and feelings; express empathy, and build rapport  while assessing primary cognitive, behavioral, interpersonal, or other symptoms of the mood  disorder. 4. Assess presence, severity, and impact of past and present mood episodes including mania (i.e.,  pressured speech,  impulsive behavior, euphoric mood, flight of ideas, reduced need for sleep,  inflated self-esteem, and high energy) on social, occupational, and interpersonal functioning;  supplement with semi-structured inventory, if desired (e.g., Young Mania Rating Scale; the  Clinical Monitoring Form); re-administer as indicated to assess treatment response. 5. Develop healthy cognitive patterns and beliefs about self and the world  that lead to alleviation and help prevent the relapse of mood  episodes. 6. Normalize energy level and return to usual activities, good judgment,  stable mood, more realistic expectations, and goal-directed behavior. 7. Normalize energy level and return to usual activities, good judgment,  stable mood, more realistic expectations, and goal-directed behavior. Diagnosis 296.52 (Bipolar I disorder, most recent episode (or current) depressed, moderate)  Medications  1. Ambien (Dosage: 10mg  qhs)  2. Klonopin (Dosage: 1mg  TID)  3. Olanzapine (Dosage: 5 mg every other day and not on weekends)  4. Prozac (Dosage: 20 mg every other day and not on weekends)  5. Wellbutrin (Dosage: 450 mg qd)  Conditions For Discharge Achievement of treatment goals and objectives  The patient approved this treatment plan and is making progress towards goals.  Deonna Krummel G Nayely Dingus, LCSW

## 2023-06-29 ENCOUNTER — Emergency Department
Admission: EM | Admit: 2023-06-29 | Discharge: 2023-06-29 | Disposition: A | Discharge status: Home / Self Care | Payer: Medicare HMO | Attending: Emergency Medicine | Admitting: Emergency Medicine

## 2023-06-29 ENCOUNTER — Emergency Department: Payer: Medicare HMO

## 2023-06-29 ENCOUNTER — Encounter: Payer: Self-pay | Admitting: Emergency Medicine

## 2023-06-29 ENCOUNTER — Other Ambulatory Visit: Payer: Self-pay

## 2023-06-29 DIAGNOSIS — W19XXXA Unspecified fall, initial encounter: Secondary | ICD-10-CM

## 2023-06-29 DIAGNOSIS — S0990XA Unspecified injury of head, initial encounter: Secondary | ICD-10-CM | POA: Diagnosis not present

## 2023-06-29 DIAGNOSIS — S0101XA Laceration without foreign body of scalp, initial encounter: Secondary | ICD-10-CM | POA: Diagnosis not present

## 2023-06-29 DIAGNOSIS — I440 Atrioventricular block, first degree: Secondary | ICD-10-CM | POA: Diagnosis not present

## 2023-06-29 DIAGNOSIS — W0110XA Fall on same level from slipping, tripping and stumbling with subsequent striking against unspecified object, initial encounter: Secondary | ICD-10-CM | POA: Insufficient documentation

## 2023-06-29 DIAGNOSIS — Z7901 Long term (current) use of anticoagulants: Secondary | ICD-10-CM | POA: Insufficient documentation

## 2023-06-29 DIAGNOSIS — E039 Hypothyroidism, unspecified: Secondary | ICD-10-CM | POA: Insufficient documentation

## 2023-06-29 DIAGNOSIS — R9431 Abnormal electrocardiogram [ECG] [EKG]: Secondary | ICD-10-CM | POA: Diagnosis not present

## 2023-06-29 LAB — TROPONIN I (HIGH SENSITIVITY)
Troponin I (High Sensitivity): 168 ng/L (ref <18)
Troponin I (High Sensitivity): 164 ng/L (ref <18)

## 2023-06-29 LAB — COMPREHENSIVE METABOLIC PANEL
ALT: 16 U/L (ref 0–44)
AST: 25 U/L (ref 15–41)
Albumin: 4.1 g/dL (ref 3.5–5.0)
Alkaline Phosphatase: 69 U/L (ref 38–126)
Anion gap: 9 (ref 5–15)
BUN: 17 mg/dL (ref 8–23)
CO2: 26 mmol/L (ref 22–32)
Calcium: 9.1 mg/dL (ref 8.9–10.3)
Chloride: 105 mmol/L (ref 98–111)
Creatinine, Ser: 1.14 mg/dL — ABNORMAL HIGH (ref 0.44–1.00)
GFR, Estimated: 53 mL/min — ABNORMAL LOW (ref >60)
Glucose, Bld: 114 mg/dL — ABNORMAL HIGH (ref 70–99)
Potassium: 4.3 mmol/L (ref 3.5–5.1)
Sodium: 140 mmol/L (ref 135–145)
Total Bilirubin: 0.6 mg/dL (ref 0.3–1.2)
Total Protein: 7.3 g/dL (ref 6.5–8.1)

## 2023-06-29 LAB — CBC WITH DIFFERENTIAL/PLATELET
Abs Immature Granulocytes: 0.03 10*3/uL (ref 0.00–0.07)
Basophils Absolute: 0.1 10*3/uL (ref 0.0–0.1)
Basophils Relative: 1 %
Eosinophils Absolute: 0.2 10*3/uL (ref 0.0–0.5)
Eosinophils Relative: 3 %
HCT: 37.3 % (ref 36.0–46.0)
Hemoglobin: 12 g/dL (ref 12.0–15.0)
Immature Granulocytes: 1 %
Lymphocytes Relative: 22 %
Lymphs Abs: 1.3 10*3/uL (ref 0.7–4.0)
MCH: 29.7 pg (ref 26.0–34.0)
MCHC: 32.2 g/dL (ref 30.0–36.0)
MCV: 92.3 fL (ref 80.0–100.0)
Monocytes Absolute: 0.5 10*3/uL (ref 0.1–1.0)
Monocytes Relative: 8 %
Neutro Abs: 4 10*3/uL (ref 1.7–7.7)
Neutrophils Relative %: 65 %
Platelets: 226 10*3/uL (ref 150–400)
RBC: 4.04 MIL/uL (ref 3.87–5.11)
RDW: 13.2 % (ref 11.5–15.5)
WBC: 6.1 10*3/uL (ref 4.0–10.5)
nRBC: 0 % (ref 0.0–0.2)

## 2023-06-29 LAB — CBG MONITORING, ED: Glucose-Capillary: 99 mg/dL (ref 70–99)

## 2023-06-29 LAB — MAGNESIUM: Magnesium: 2.1 mg/dL (ref 1.7–2.4)

## 2023-06-29 LAB — TSH: TSH: 5.243 u[IU]/mL — ABNORMAL HIGH (ref 0.350–4.500)

## 2023-06-29 LAB — T4, FREE: Free T4: 1.4 ng/dL — ABNORMAL HIGH (ref 0.61–1.12)

## 2023-06-29 NOTE — ED Triage Notes (Signed)
Pt to ER with c/o falling backward this AM while getting her pill bottle out of the cabinet.  Pt unsure if she lost her balance or blacked out. Pt reports dizziness after falling.  Pt hit her head on a dresser railing.  Noted laceration to back of head, minor bleeding at this time.  Pt takes Eliquis.

## 2023-06-29 NOTE — ED Notes (Signed)
CRITICAL VALUE STICKER  CRITICAL VALUE:Trop [id number]  RECEIVER (on-site recipient of call):I. Layne Lebon  DATE & TIME NOTIFIED: 06/29/23 0854  MESSENGER (representative from lab):lab  MD NOTIFIED: Modesto Charon  TIME OF NOTIFICATION:0854  RESPONSE:

## 2023-06-29 NOTE — ED Provider Notes (Signed)
[facility] Provider Note    Event Date/Time   First MD Initiated Contact with Patient 06/29/23 0732     (approximate)   History   Fall   HPI  [name] [name] is a 67 y.o. female   Past medical history of atrial flutter on Eliquis status post ablation, hypothyroid, hyperlipidemia who presents to the emergency department with a fall.  She was in her regular state of health awoke this morning use the bathroom, was getting her medications out of the cabinet on a lower drawer bent down to get her medications stood back up lost her balance fell backwards and hit the back of her head.  No loss of consciousness.  Otherwise no other acute medical complaints and has otherwise been in her regular state of health with no recent illnesses.  She feels lightheaded after her fall.  Denies chest pain, shortness of breath, palpitations or any other associated symptoms.  She had an ablation through her right groin for her a flutter and has been healing well from that site of access since the procedure.  Independent Historian contributed to assessment above: Husband is at bedside to corroborate information given above  External Medical Documents Reviewed: Heber Sanborn note from 06/26/2023 documenting a catheter ablation of atrial flutter/atrial fibrillation       Physical Exam   Triage Vital Signs: ED Triage Vitals  Encounter Vitals Group     BP 06/29/23 0728 (!) 138/101     Systolic BP Percentile --      Diastolic BP Percentile --      Pulse Rate 06/29/23 0728 70     Resp 06/29/23 0728 18     Temp --      Temp src --      SpO2 06/29/23 0728 97 %     Weight 06/29/23 0729 224 lb (101.6 kg)     Height 06/29/23 0729 5' 0.5" (1.537 m)     Head Circumference --      Peak Flow --      Pain Score 06/29/23 0729 7     Pain Loc --      Pain Education --      Exclude from Growth Chart --     Most recent vital signs: Vitals:   06/29/23 0728 06/29/23 0758  BP: (!)  138/101   Pulse: 70   Resp: 18   Temp:  98.3 F (36.8 C)  SpO2: 97%     General: Awake, no distress.  CV:  Good peripheral perfusion.  Resp:  Normal effort.  Abd:  No distention.  Other:  Small laceration 1 cm to the occiput hemostatic.  Neck supple with full range of motion.  No other acute traumatic injuries noted on head to toe exam.  Right access site to the groin appears clean dry and intact with some small bruising.  No focal neurologic deficits including dysarthria facial asymmetry finger-to-nose gait steady, no motor or sensory deficits.   ED Results / Procedures / Treatments   Labs (all labs ordered are listed, but only abnormal results are displayed) Labs Reviewed  COMPREHENSIVE METABOLIC PANEL  CBC WITH DIFFERENTIAL/PLATELET  MAGNESIUM  TSH  T4, FREE  CBG MONITORING, ED  TROPONIN I (HIGH SENSITIVITY)     I ordered and reviewed the above labs they are notable for initial troponin is in the 100s  EKG  ED ECG REPORT I, Pilar Jarvis, the attending physician, personally viewed and interpreted this ECG.   Date:  06/29/2023  EKG Time: 0730  Rate: 73  Rhythm: sinus  Axis: nl  Intervals: 1st degree av block  ST&T Change: no stemi    RADIOLOGY I independently reviewed and interpreted CT scan of the head see no obvious bleeding or midline shift   PROCEDURES:  Critical Care performed: No  ..Laceration Repair  Date/Time: 06/29/2023 8:01 AM  Performed by: Pilar Jarvis, MD Authorized by: Pilar Jarvis, MD   Consent:    Consent obtained:  Verbal   Consent given by:  Patient   Risks discussed:  Infection and pain Universal protocol:    Procedure explained and questions answered to patient or proxy's satisfaction: yes     Patient identity confirmed:  Verbally with patient Anesthesia:    Anesthesia method:  None Laceration details:    Location:  Scalp   Scalp location:  Occipital   Length (cm):  1   Depth (mm):  3 Exploration:    Hemostasis achieved with:   Direct pressure   Wound exploration: wound explored through full range of motion     Wound extent: no foreign body     Contaminated: no   Treatment:    Area cleansed with:  Soap and water   Amount of cleaning:  Standard   Irrigation solution:  Tap water   Debridement:  None   Undermining:  None Skin repair:    Repair method:  Staples   Number of staples:  2 Approximation:    Approximation:  Close Repair type:    Repair type:  Simple Post-procedure details:    Dressing:  Open (no dressing)   Procedure completion:  Tolerated    MEDICATIONS ORDERED IN ED: Medications - No data to display   IMPRESSION / MDM / ASSESSMENT AND PLAN / ED COURSE  I reviewed the triage vital signs and the nursing notes.                                Patient's presentation is most consistent with acute presentation with potential threat to life or bodily function.  Differential diagnosis includes, but is not limited to, mechanical fall leading to blunt traumatic injury including ICH, laceration, mechanism of fall including presyncope due to orthostatic hypotension, vasovagal, cardiac dysrhythmia, ACS, electrolyte derangement, CVA   The patient is on the cardiac monitor to evaluate for evidence of arrhythmia and/or significant heart rate changes.  MDM:   Mechanical slip and fall most likely after bending over to pick up medications having some brief dizziness falling backwards and hitting head.  No focal neurologic deficits and no ongoing dizziness or gait instability suggest stroke.  Small laceration to the back of the head was pared with staples.  CT of the head rule out intracranial bleeding and this anticoagulated patient with head strike.  Check basic labs, troponin, EKG for electrolyte disturbances, signs of cardiac ischemia to account for her dizziness and fall. Tdap UTD.   Workup thus far unremarkable and patient stable.  Her initial troponin is in the 160s but she did have a catheter  ablation obtained 2 days ago I wonder if this is in the setting of recent ablation, she has 0 chest pain and so we will trend this troponin - if flat plan will be for discharge      FINAL CLINICAL IMPRESSION(S) / ED DIAGNOSES   Final diagnoses:  Minor head injury, initial encounter  Fall, initial encounter  Scalp laceration, initial encounter  Rx / DC Orders   ED Discharge Orders     None        Note:  This document was prepared using Dragon voice recognition software and may include unintentional dictation errors.    Pilar Jarvis, MD 06/29/23 [phone]

## 2023-06-29 NOTE — Discharge Instructions (Signed)
Please keep your wound clean by washing at least daily with soap and water. If you see any signs of infection like spreading redness, pus coming from the wound, extreme pain, fevers, chills or any other worsening doctor right away or come back to the emergency department See your doctor return to the emergency department in 7 days for staple removal.

## 2023-07-02 ENCOUNTER — Telehealth: Payer: Self-pay | Admitting: Family Medicine

## 2023-07-02 ENCOUNTER — Encounter: Payer: Self-pay | Admitting: Family Medicine

## 2023-07-02 ENCOUNTER — Ambulatory Visit (INDEPENDENT_AMBULATORY_CARE_PROVIDER_SITE_OTHER): Payer: Medicare HMO | Admitting: Family Medicine

## 2023-07-02 VITALS — BP 112/80 | HR 68 | Temp 98.2°F | Ht 60.5 in | Wt 222.0 lb

## 2023-07-02 DIAGNOSIS — R109 Unspecified abdominal pain: Secondary | ICD-10-CM | POA: Diagnosis not present

## 2023-07-02 MED ORDER — LAMOTRIGINE 150 MG PO TABS: 150.0000 mg | ORAL_TABLET | Freq: Every day | ORAL | Status: DC

## 2023-07-02 NOTE — Patient Instructions (Signed)
Clear liquids in the meantime.  If any worse, then go to the ER. I suspect this is an abdominal wall strain and/or constipation related, but if worse then update us/go to ER.   Take care.  Glad to see you.

## 2023-07-02 NOTE — Progress Notes (Unsigned)
Patient had an ablation done on 06/26/23 at [facility] and had a fall on the 20th; has 2 staples in scalp.  Was getting meds out of cabinet.  Was seen at ER.  Not due for staple removal.    She is in the midst of addressing localized melanoma.    Lower abd pain since yesterday.  "It's a wave of pain" episodically.  In the R side of the abd, doesn't cross the midline.  RLQ.  She still has her appendix.  Normal appetite, no pain with eating.  Pain happens about every 30 minutes.  More pain with more movement.  No blood in stool.  No black stools.  No dysuria.  No L sided pain.  When not having pain, no pain palpation. Pain lasts for about 20 seconds then resolves.  Able to sleep last night w/o pain.  Pain with twisting.  Pain today isn't worse than yesterday.  No fevers.  Already on PPI.  Miralax helped with constipation, had BM x2 recently.  Has been taking tylenol for pain.  She has L arm pain and left rib pain from the fall.     Meds, vitals, and allergies reviewed.   ROS: Per HPI unless specifically indicated in ROS section    Nad Ncat except for healing scalp lac with 2 staples. Not inflamed or bleeding.   Neck supple, no LA Rrr Ctab Abd soft, normal BS RLQ mildly ttp with reproduced sx with back extension (abd wall stretch) and with twisting to the right.  No rebound.  RUQ not ttp.   Bruise L forearm.   Chaperoned exam with post surgical changes on the R groin, w/o active inflammation.     We talked about scan now vs observation for the next few hours.  She opted for observation with strict ER cautions d/w pt.  If no escalation of sx, then continue as is.

## 2023-07-02 NOTE — Telephone Encounter (Signed)
Spoke with patient. She had diarrhea earlier and was feeling sick from not eating. She finally ate a peanut butter sandwhich and has felt fine since. States she has not had any abd pain since.

## 2023-07-02 NOTE — Telephone Encounter (Signed)
Please get update on patient re: right sided abd pain and let me know.  Thanks .

## 2023-07-03 DIAGNOSIS — R109 Unspecified abdominal pain: Secondary | ICD-10-CM | POA: Insufficient documentation

## 2023-07-03 NOTE — Telephone Encounter (Signed)
Noted.  Thanks.  Glad to hear.  ? ?

## 2023-07-03 NOTE — Assessment & Plan Note (Signed)
We talked about options with CT scan now vs observation for the next few hours.  She opted for observation with strict ER cautions d/w pt.  If no escalation of sx, then continue as is.   She agreed with plan.  It looks like her abdominal pain is actually abdominal wall pain versus constipation versus both- not an ominous diagnosis.

## 2023-07-05 ENCOUNTER — Ambulatory Visit: Payer: Medicare HMO | Admitting: Family Medicine

## 2023-07-05 ENCOUNTER — Encounter: Payer: Self-pay | Admitting: Family Medicine

## 2023-07-05 VITALS — BP 122/80 | HR 80 | Temp 99.0°F | Ht 60.5 in | Wt 219.0 lb

## 2023-07-05 DIAGNOSIS — R519 Headache, unspecified: Secondary | ICD-10-CM | POA: Insufficient documentation

## 2023-07-05 NOTE — Assessment & Plan Note (Signed)
Occipital HA improved with staple removal.  Keep going with flonase and take tylenol as needed for likely sinus pressure.  Update [place] as needed.  She agrees.

## 2023-07-05 NOTE — Patient Instructions (Signed)
Keep going with flonase and take tylenol as needed.  Take care.  Glad to see you. Update [place] as needed.

## 2023-07-05 NOTE — Progress Notes (Signed)
Here for scalp staple removal.    Had been sleeping on her side.  HA this AM.  Due for staple removal.  Taking tylenol in the meantime.  Still with some max sinus discomfort. No FCNAVD.    Abd pain resolved in the meantime.    Meds, vitals, and allergies reviewed.   ROS: Per HPI unless specifically indicated in ROS section   Nad Ncat except for suture line healing normally on occiput.   L SOM noted. R TM wnl B canals wnl.   Nasal exam stuffy.   R frontal sinus ttp.   Rrr Ctab Occipital HA improved with staple removal.  Staples easily removed x2, still with good tissue apposition.  Tolerated well.  She consented prior to removal.

## 2023-07-08 ENCOUNTER — Telehealth: Payer: Self-pay | Admitting: Family Medicine

## 2023-07-08 NOTE — Telephone Encounter (Signed)
That is ok with me if Dr Para March is taking new (but will be up to him)  Very sweet lady

## 2023-07-08 NOTE — Telephone Encounter (Signed)
Pt called in requesting to transfer her car from Dr. Milinda Antis to Dr. Para March. Pt stated after seeing Dr. Para March on 7/26, she prefers his care. Pt states it wasn't anything personal with Dr. Royden Purl care, she just prefers Dr. Ranee Gosselin. Informed pt that Dr. Para March doesn't accept new pts but sometimes makes exceptions if a family member is under his care already. Is this TOC okay? Call back # [phone]

## 2023-07-09 ENCOUNTER — Other Ambulatory Visit: Payer: Self-pay

## 2023-07-09 MED ORDER — FAMOTIDINE 20 MG PO TABS: 20.0000 mg | ORAL_TABLET | Freq: Two times a day (BID) | ORAL | 1 refills | Status: DC

## 2023-07-09 NOTE — Telephone Encounter (Signed)
Dr. Milinda Antis approve to change prescription.  I got a red warning that famotidine should be 20mg  one time per day but pt is taking BID.

## 2023-07-09 NOTE — Telephone Encounter (Signed)
I can't take transfers at this point.  I have to decline.  Thanks.

## 2023-07-09 NOTE — Telephone Encounter (Signed)
Called and left message on patients vm that we can not take any transfers at this time.

## 2023-07-16 ENCOUNTER — Ambulatory Visit: Payer: Medicare HMO | Admitting: Dermatology

## 2023-07-16 VITALS — BP 157/91 | HR 68

## 2023-07-16 DIAGNOSIS — L821 Other seborrheic keratosis: Secondary | ICD-10-CM

## 2023-07-16 DIAGNOSIS — Z86018 Personal history of other benign neoplasm: Secondary | ICD-10-CM

## 2023-07-16 DIAGNOSIS — L820 Inflamed seborrheic keratosis: Secondary | ICD-10-CM

## 2023-07-16 DIAGNOSIS — W908XXA Exposure to other nonionizing radiation, initial encounter: Secondary | ICD-10-CM | POA: Diagnosis not present

## 2023-07-16 DIAGNOSIS — L814 Other melanin hyperpigmentation: Secondary | ICD-10-CM

## 2023-07-16 DIAGNOSIS — D1801 Hemangioma of skin and subcutaneous tissue: Secondary | ICD-10-CM

## 2023-07-16 DIAGNOSIS — Z85828 Personal history of other malignant neoplasm of skin: Secondary | ICD-10-CM

## 2023-07-16 DIAGNOSIS — Z86006 Personal history of melanoma in-situ: Secondary | ICD-10-CM

## 2023-07-16 DIAGNOSIS — Q828 Other specified congenital malformations of skin: Secondary | ICD-10-CM

## 2023-07-16 DIAGNOSIS — L578 Other skin changes due to chronic exposure to nonionizing radiation: Secondary | ICD-10-CM

## 2023-07-16 DIAGNOSIS — D2239 Melanocytic nevi of other parts of face: Secondary | ICD-10-CM

## 2023-07-16 DIAGNOSIS — Z1283 Encounter for screening for malignant neoplasm of skin: Secondary | ICD-10-CM

## 2023-07-16 DIAGNOSIS — D229 Melanocytic nevi, unspecified: Secondary | ICD-10-CM

## 2023-07-16 NOTE — Progress Notes (Signed)
Follow-Up Visit   Subjective  [name] [name] is a 67 y.o. female who presents for the following: Skin Cancer Screening and Full Body Skin Exam  The patient presents for Total-Body Skin Exam (TBSE) for skin cancer screening and mole check. The patient has spots, moles and lesions to be evaluated, some may be new or changing and the patient may have concern these could be cancer, including right nose, left and right lower leg, and right foot. She has irritated spots on her chest and hands to check. She has a h/o melanoma IS labia minora treated by surgery a d followed by gyn onc  The following portions of the chart were reviewed this encounter and updated as appropriate: medications, allergies, medical history  Review of Systems:  No other skin or systemic complaints except as noted in HPI or Assessment and Plan.  Objective  Well appearing patient in no apparent distress; mood and affect are within normal limits.  A full examination was performed including scalp, head, eyes, ears, nose, lips, neck, chest, axillae, abdomen, back, buttocks, bilateral upper extremities, bilateral lower extremities, hands, feet, fingers, toes, fingernails, and toenails. All findings within normal limits unless otherwise noted below.   Relevant physical exam findings are noted in the Assessment and Plan.  R upper chest x 1, sternal notch x 1, L hand x 1, R hand x 1, R med lower canthus x 1 (5) Erythematous stuck-on, waxy papule or plaque  Right Medial Lower Pretibia, Right Medial Ankle Pink tan scaly macules with keratotic rim.    Assessment & Plan   SKIN CANCER SCREENING PERFORMED TODAY.  ACTINIC DAMAGE - Chronic condition, secondary to cumulative UV/sun exposure - diffuse scaly erythematous macules with underlying dyspigmentation - Recommend daily broad spectrum sunscreen SPF 30+ to sun-exposed areas, reapply every 2 hours as needed.  - Staying in the shade or wearing long sleeves, sun glasses  (UVA+UVB protection) and wide brim hats (4-inch brim around the entire circumference of the hat) are also recommended for sun protection.  - Call for new or changing lesions.  LENTIGINES, SEBORRHEIC KERATOSES, HEMANGIOMAS - Benign normal skin lesions - Benign-appearing - Call for any changes  MELANOCYTIC NEVI - Tan-brown and/or pink-flesh-colored symmetric macules and papules, including flesh papule on the nose - Benign appearing on exam today - Observation - Call clinic for new or changing moles - Recommend daily use of broad spectrum spf 30+ sunscreen to sun-exposed areas.   HISTORY OF BASAL CELL CARCINOMA OF THE SKIN Right lower pretibia 10/2010 - No evidence of recurrence today - Recommend regular full body skin exams - Recommend daily broad spectrum sunscreen SPF 30+ to sun-exposed areas, reapply every 2 hours as needed.  - Call if any new or changing lesions are noted between office visits  HISTORY OF DYSPLASTIC NEVUS Spinal upper back, severe, exc 01/15/2022 No evidence of recurrence today Recommend regular full body skin exams Recommend daily broad spectrum sunscreen SPF 30+ to sun-exposed areas, reapply every 2 hours as needed.  Call if any new or changing lesions are noted between office visits  HISTORY OF MELANOMA IN SITU Right labia minora, exc 02/12/23, Dr Dixie Dials (gynecologic oncology) Being followed every 3 months.  - Not examined today. - Recommend regular full body skin exams - Recommend daily broad spectrum sunscreen SPF 30+ to sun-exposed areas, reapply every 2 hours as needed.  - Call if any new or changing lesions are noted between office visits  HISTORY OF SQUAMOUS CELL CARCINOMA  OF THE SKIN Right leg - No evidence of recurrence today - Recommend regular full body skin exams - Recommend daily broad spectrum sunscreen SPF 30+ to sun-exposed areas, reapply every 2 hours as needed.  - Call if any new or changing lesions are noted between office  visits   Inflamed seborrheic keratosis (5) R upper chest x 1, sternal notch x 1, L hand x 1, R hand x 1, R med lower canthus x 1  Symptomatic, irritating, patient would like treated.  Destruction of lesion - R upper chest x 1, sternal notch x 1, L hand x 1, R hand x 1, R med lower canthus x 1 (5)  Destruction method: cryotherapy   Informed consent: discussed and consent obtained   Lesion destroyed using liquid nitrogen: Yes   Region frozen until ice ball extended beyond lesion: Yes   Outcome: patient tolerated procedure well with no complications   Post-procedure details: wound care instructions given   Additional details:  Prior to procedure, discussed risks of blister formation, small wound, skin dyspigmentation, or rare scar following cryotherapy. Recommend Vaseline ointment to treated areas while healing.   Porokeratosis Right Medial Lower Pretibia, Right Medial Ankle  Benign-appearing.  Observation.  Call clinic for new or changing lesions.  Recommend daily use of broad spectrum spf 30+ sunscreen to sun-exposed areas.     Return in about 6 months (around 01/16/2024) for TBSE, Hx melanoma IS vulvar, Hx BCC, Hx SCC.  ICherlyn Labella, CMA, am acting as scribe for Willeen Niece, MD .   Documentation: I have reviewed the above documentation for accuracy and completeness, and I agree with the above.  Willeen Niece, MD

## 2023-07-16 NOTE — Patient Instructions (Addendum)

## 2023-07-17 DIAGNOSIS — I10 Essential (primary) hypertension: Secondary | ICD-10-CM | POA: Diagnosis not present

## 2023-07-17 DIAGNOSIS — I4892 Unspecified atrial flutter: Secondary | ICD-10-CM | POA: Diagnosis not present

## 2023-07-17 DIAGNOSIS — Z8679 Personal history of other diseases of the circulatory system: Secondary | ICD-10-CM | POA: Diagnosis not present

## 2023-07-17 DIAGNOSIS — Z9889 Other specified postprocedural states: Secondary | ICD-10-CM | POA: Diagnosis not present

## 2023-07-18 DIAGNOSIS — R8290 Unspecified abnormal findings in urine: Secondary | ICD-10-CM | POA: Diagnosis not present

## 2023-07-18 DIAGNOSIS — N1831 Chronic kidney disease, stage 3a: Secondary | ICD-10-CM | POA: Diagnosis not present

## 2023-07-18 DIAGNOSIS — R809 Proteinuria, unspecified: Secondary | ICD-10-CM | POA: Diagnosis not present

## 2023-07-18 DIAGNOSIS — I129 Hypertensive chronic kidney disease with stage 1 through stage 4 chronic kidney disease, or unspecified chronic kidney disease: Secondary | ICD-10-CM | POA: Diagnosis not present

## 2023-07-19 ENCOUNTER — Telehealth: Payer: Self-pay

## 2023-07-19 ENCOUNTER — Ambulatory Visit (INDEPENDENT_AMBULATORY_CARE_PROVIDER_SITE_OTHER): Payer: Medicare HMO | Admitting: Psychology

## 2023-07-19 DIAGNOSIS — F3181 Bipolar II disorder: Secondary | ICD-10-CM

## 2023-07-19 NOTE — Telephone Encounter (Signed)
Pt called office stating she needs to reschedule her appointment with Dr. Alvester Morin on 9/23 stating she will be on a cruise.   Appointment rescheduled for 10/14 @ 1:30. pt agreed to date/time

## 2023-07-19 NOTE — Progress Notes (Signed)
[facility] Counselor/Therapist Progress Note  Patient ID: [name] [name], MRN: [id number],    Date: 07/19/2023  Time Spent: 53 minutes  Time in: 2:00 Time out : 2:53  Treatment Type: Individual Therapy  Reported Symptoms: none  Mental Status Exam: Appearance:  Casual     Behavior: Appropriate  Motor: Normal  Speech/Language:  normal  Affect: Appropriate  Mood: pleasant  Thought process: normal  Thought content:   WNL  Sensory/Perceptual disturbances:   WNL  Orientation: oriented to person, place, time/date, and situation  Attention: Good  Concentration: Good  Memory: WNL  Fund of knowledge:  Good  Insight:   Good  Judgment:  Good  Impulse Control: Good   Risk Assessment: Danger to Self:  No Self-injurious Behavior: No Danger to Others: No Duty to Warn:no Physical Aggression / Violence:No  Access to Firearms a concern: No  Gang Involvement:No   Subjective: The patient attended a face-to-face individual therapy session via video visit today.  The patient agreed and consented for this session to be on caregility and patient is aware of limitations of telehealth.  The patient was in her home alone and the therapist was in the office.  The patient presents with a blunted affect and mood is pleasant.  The patient reports that over the last few weeks she has been nursing a wound to her head because she fell and hit her head.  She states that she thinks she passed out and hit the floor and cut her head in the back.  She states that she has been having to go to doctors a lot and she seems to be on the other side of that now.  She is looking forward to going to see her daughter in September and on a cruise in October.  We talked about her taking care of herself and being mindful about what she is doing.  She did call her medication provider and let them know that she was on some other medications through the heart doctor so that they can being aware of how to adjust her  medicines as needed.  The other thing the patient asked me about was when she goes to see her daughter she is concerned about her because she has put a lot of her weight back on.  She was asking how to handle that situation and I explained that I thought if her daughter brought it up it was probably okay to talk about it but if her daughter does not bring it up then I am not sure that it is the right time for her to try to discuss that at her prompting.  Interventions: Cognitive Behavioral Therapy, meditation and mindfulness  Diagnosis:Bipolar II disorder (HCC)  Plan: Client Abilities/Strengths  intelligent, insightful  Client Treatment Preferences  Outpatient individual therapy  Client Statement of Needs  "I need someone to help monitor and manage my bipolar"  Treatment Level  Outpatient individual therapy  Symptoms  Depressed or irritable mood.: (Status: improved). Diminished interest in or  enjoyment of activities.: (Status: improved). Displays a poor attention span and is easily distracted.:  (Status: improved). Exhibits an abnormally and  persistently elevated, expansive, or irritable mood with at least three symptoms of mania (i.e., inflated  self-esteem or grandiosity, decreased need for sleep, pressured speech, flight of ideas, distractibility,  excessive goal-directed activity or psychomotor agitation, excessive involvement in pleasurable, highrisk behavior).:  (Status: improved). Feelings of hopelessness, worthlessness, or inappropriate guilt.: (Status: improved). History of at least one hypomanic,  manic, or mixed mood episode.:  (Status: maintained). History of chronic or  recurrent depression for which the client has taken antidepressant medication, been hospitalized, had  outpatient treatment, or had a course of electroconvulsive therapy.:  (Status:  maintained). Lack of energy.: (Status: improved). Lacks follow-through in  projects, even though energy is very high, since  behavior lacks discipline and goal-directedness.:  (Status: improved). Poor concentration and indecisiveness.:  (Status: improved). Reports flight of ideas or thoughts racing.: (Status: improved). Shows evidence of a decreased need for sleep.: (Status: improved).  Sleeplessness or hypersomnia.:  (Status: improved). Social withdrawal.: (Status: improved). Suicidal thoughts and/or gestures.:   (Status: improved). The elevated mood or irritability (mania) causes marked impairment in  occupational functioning, social activities, or relationships with others.: (Status: improved). Verbalizes grandiose ideas and/or persecutory beliefs.:  (Status: improved).  Problems Addressed  Bipolar Disorder - Mania, Bipolar Disorder - Depression, Bipolar Disorder - Mania, Bipolar Disorder -  Mania, Bipolar Disorder - Depression, Bipolar Disorder - Depression, Bipolar Disorder - Mania  Goals 1. Achieve controlled behavior, moderated mood, more deliberative  speech and thought process, and a stable daily activity pattern. 2. Achieve controlled behavior, moderated mood, more deliberative  speech and thought process, and a stable daily activity pattern. 3. Alleviate manic/hypomanic mood and return to previous level of  effective functioning. 4. Develop healthy cognitive patterns and beliefs about self and the world  that lead to alleviation and help prevent the relapse of  manic/hypomanic episodes. Objective Maintain a pattern of regular rhythm to daily activities. Target Date: 2024-05-07 Frequency: Monthly Progress: 80 Modality: individual Related Interventions 1. Assist the client in establishing a more routine pattern of daily activities such as sleeping,  eating, solitary and social activities, and exercise; use and review a form to schedule, assess,  and modify these activities so that they occur in a predictable rhythm every day. Objective Identify and replace thoughts and behaviors that trigger manic or  depressive symptoms. Target Date: 2024-05-07 Frequency: Monthly Progress: 80 Modality: individual Related Interventions 1. Use cognitive therapy techniques to explore and educate the client about cognitive biases that  trigger his/her elevated or depressive mood (see Cognitive Therapy for Bipolar Disorder by  Aggie Hacker al.). 2. Teach the client cognitive-behavioral coping and relapse prevention skills including delaying  impulsive actions, structured scheduling of daily activities, keeping a regular sleep routine,  avoiding unrealistic goal striving, using relaxation procedures, identifying and avoiding episode triggers such as stimulant drug use, alcohol consumption, breaking sleep routine, or exposing  self to high stress (see Cognitive Therapy for Bipolar Disorder by Aggie Hacker al.). 3. Use cognitive therapy techniques to explore and educate the client's about cognitive biases that  trigger his/her elevated or depressive mood (see Cognitive Therapy for Bipolar Disorder by  Aggie Hacker al.). Objective Discuss and resolve troubling personal and interpersonal issues. Target Date: 2024-05-07 Frequency: Monthly Progress: 60 Modality: individual Objective Participate in periodic "maintenance" sessions. Target Date: 2024-05-07 Frequency: Monthly Progress: 80 Modality: individual Related Interventions 1. Hold periodic "maintenance" sessions within the first few months after therapy to facilitate the  client's positive changes; problem-solve obstacles to improvement. 2. Hold periodic "maintenance" sessions within the first few months after therapy to facilitate the  client's positive changes; problem-solve obstacles to improvement. Objective Describe mood state, energy level, amount of control over thoughts, and sleeping pattern. Target Date: 2024-05-07 Frequency: Monthly Progress: 80 Modality: individual Related Interventions 1. Encourage the client to share his/her thoughts and feelings; express  empathy and build rapport  while assessing primary cognitive, behavioral, interpersonal, or other symptoms of the mood  disorder. 2. Assess presence, severity, and impact of past and present mood episodes on social,  occupational, and interpersonal functioning; supplement with semi-structured inventory, if  desired (e.g., Montgomery-Asberg Depression Rating Scale, Inventory to Diagnose  Depression). 3. Encourage the client to share his/her thoughts and feelings; express empathy, and build rapport  while assessing primary cognitive, behavioral, interpersonal, or other symptoms of the mood  disorder. 4. Assess presence, severity, and impact of past and present mood episodes including mania (i.e.,  pressured speech, impulsive behavior, euphoric mood, flight of ideas, reduced need for sleep,  inflated self-esteem, and high energy) on social, occupational, and interpersonal functioning;  supplement with semi-structured inventory, if desired (e.g., Young Mania Rating Scale; the  Clinical Monitoring Form); re-administer as indicated to assess treatment response. 5. Develop healthy cognitive patterns and beliefs about self and the world  that lead to alleviation and help prevent the relapse of mood  episodes. 6. Normalize energy level and return to usual activities, good judgment,  stable mood, more realistic expectations, and goal-directed behavior. 7. Normalize energy level and return to usual activities, good judgment,  stable mood, more realistic expectations, and goal-directed behavior. Diagnosis 296.52 (Bipolar I disorder, most recent episode (or current) depressed, moderate)  Medications  1. Ambien (Dosage: 10mg  qhs)  2. Klonopin (Dosage: 1mg  TID)  3. Olanzapine (Dosage: 5 mg every other day and not on weekends)  4. Prozac (Dosage: 20 mg every other day and not on weekends)  5. Wellbutrin (Dosage: 450 mg qd)  Conditions For Discharge Achievement of treatment goals and objectives  The  patient approved this treatment plan and is making progress towards goals.   G , LCSW

## 2023-07-23 ENCOUNTER — Other Ambulatory Visit: Payer: Self-pay | Admitting: Nephrology

## 2023-07-23 DIAGNOSIS — N1831 Chronic kidney disease, stage 3a: Secondary | ICD-10-CM

## 2023-07-23 DIAGNOSIS — R8290 Unspecified abnormal findings in urine: Secondary | ICD-10-CM

## 2023-07-23 DIAGNOSIS — I129 Hypertensive chronic kidney disease with stage 1 through stage 4 chronic kidney disease, or unspecified chronic kidney disease: Secondary | ICD-10-CM

## 2023-07-29 ENCOUNTER — Ambulatory Visit
Admission: RE | Admit: 2023-07-29 | Discharge: 2023-07-29 | Disposition: A | Discharge status: Home / Self Care | Payer: Medicare HMO | Source: Ambulatory Visit | Attending: Nephrology | Admitting: Nephrology

## 2023-07-29 DIAGNOSIS — N1831 Chronic kidney disease, stage 3a: Secondary | ICD-10-CM | POA: Insufficient documentation

## 2023-07-29 DIAGNOSIS — I129 Hypertensive chronic kidney disease with stage 1 through stage 4 chronic kidney disease, or unspecified chronic kidney disease: Secondary | ICD-10-CM | POA: Insufficient documentation

## 2023-07-29 DIAGNOSIS — N189 Chronic kidney disease, unspecified: Secondary | ICD-10-CM | POA: Diagnosis not present

## 2023-07-29 DIAGNOSIS — R8290 Unspecified abnormal findings in urine: Secondary | ICD-10-CM | POA: Insufficient documentation

## 2023-07-30 ENCOUNTER — Encounter: Payer: Self-pay | Admitting: Family Medicine

## 2023-07-30 ENCOUNTER — Ambulatory Visit
Admission: RE | Admit: 2023-07-30 | Discharge: 2023-07-30 | Disposition: A | Discharge status: Home / Self Care | Payer: Medicare HMO | Source: Ambulatory Visit | Attending: Family Medicine | Admitting: Family Medicine

## 2023-07-30 DIAGNOSIS — E2839 Other primary ovarian failure: Secondary | ICD-10-CM | POA: Insufficient documentation

## 2023-07-30 DIAGNOSIS — Z780 Asymptomatic menopausal state: Secondary | ICD-10-CM | POA: Diagnosis not present

## 2023-07-30 DIAGNOSIS — M8580 Other specified disorders of bone density and structure, unspecified site: Secondary | ICD-10-CM | POA: Insufficient documentation

## 2023-07-30 DIAGNOSIS — M85852 Other specified disorders of bone density and structure, left thigh: Secondary | ICD-10-CM | POA: Diagnosis not present

## 2023-08-09 ENCOUNTER — Ambulatory Visit (INDEPENDENT_AMBULATORY_CARE_PROVIDER_SITE_OTHER): Payer: Medicare HMO | Admitting: Family Medicine

## 2023-08-09 ENCOUNTER — Encounter: Payer: Self-pay | Admitting: Family Medicine

## 2023-08-09 VITALS — BP 120/72 | HR 67 | Temp 98.4°F | Ht 61.5 in | Wt 219.1 lb

## 2023-08-09 DIAGNOSIS — M25572 Pain in left ankle and joints of left foot: Secondary | ICD-10-CM | POA: Insufficient documentation

## 2023-08-09 DIAGNOSIS — R21 Rash and other nonspecific skin eruption: Secondary | ICD-10-CM | POA: Diagnosis not present

## 2023-08-09 MED ORDER — VALACYCLOVIR HCL 1 G PO TABS: 1000.0000 mg | ORAL_TABLET | Freq: Three times a day (TID) | ORAL | 0 refills | Status: DC

## 2023-08-09 NOTE — Progress Notes (Signed)
Patient ID: [name] [name], female    DOB: Mar 04, 1956, 67 y.o.   MRN: [id number]  This visit was conducted in person.  BP 120/72 (BP Location: Right Arm, Patient Position: Sitting, Cuff Size: Large)   Pulse 67   Temp 98.4 F (36.9 C) (Temporal)   Ht 5' 1.5" (1.562 m)   Wt 219 lb 2 oz (99.4 kg)   SpO2 97%   BMI 40.73 kg/m    CC:  Chief Complaint  Patient presents with   Joint Swelling    Left   Rash    Right Upper Buttock-Concern it's shingles    Subjective:   HPI: [name] [name] is a 67 y.o. female presenting on 08/09/2023 for Joint Swelling (Left) and Rash (Right Upper Buttock-Concern it's shingles)   New onset swelling in left ankle ...  3 weeks ago s tarted after doing exercise at aerobic class.Marland Kitchen ankles circles.  Got more sore after doing bone density and having ankle in strange position.. pigeon toed.   Ankle sore with weight bearing... no redness, no heat.   No history of ankle issues... history of OA in knee and back.   No new fall.  No fever.  Had gout 20 years ago.   Using voltaren gel BID and ice, elevating it as well.     She has also noted new lesion on right upper buttock in last 24 hours.Marland Kitchen tender to touch. Looks like red area, unsure if blister.  Hurts like shingles sh has had in the past.   Has had shingrix  x2   Relevant past medical, surgical, family and social history reviewed and updated as indicated. Interim medical history since our last visit reviewed. Allergies and medications reviewed and updated. Outpatient Medications Prior to Visit  Medication Sig Dispense Refill   apixaban (ELIQUIS) 5 MG TABS tablet Take 5 mg by mouth 2 (two) times daily.     BIOTIN PO Take 1 tablet by mouth daily.     buPROPion (WELLBUTRIN XL) 300 MG 24 hr tablet Take 300 mg by mouth daily.     cetirizine (ZYRTEC ALLERGY) 10 MG tablet Take 10 mg by mouth daily as needed for allergies.     Cholecalciferol (VITAMIN D) 2000 UNITS CAPS Take 1 capsule by mouth daily.      clonazePAM (KLONOPIN) 1 MG tablet Take 1 mg by mouth at bedtime.     cyanocobalamin (VITAMIN B12) 1000 MCG tablet Take by mouth.     famotidine (PEPCID) 20 MG tablet Take 1 tablet (20 mg total) by mouth 2 (two) times daily. 180 tablet 1   fluticasone (FLONASE) 50 MCG/ACT nasal spray Place into the nose.     hydrocortisone (ANUSOL-HC) 2.5 % rectal cream Apply 1 application. topically 2 (two) times daily. To affected area twice daily (around anus/ hemorrhoid) 30 g 0   lamoTRIgine (LAMICTAL) 150 MG tablet Take 1 tablet (150 mg total) by mouth daily.     levothyroxine (SYNTHROID) 50 MCG tablet TAKE 1 TABLET EVERY DAY BEFORE BREAKFAST 90 tablet 2   lisinopril (ZESTRIL) 10 MG tablet Take 10 mg by mouth daily.     lovastatin (MEVACOR) 40 MG tablet TAKE 1 TABLET EVERY DAY 90 tablet 2   metroNIDAZOLE (METROGEL) 1 % gel Apply topically daily. To nose, cheeks for rosacea 45 g 2   nystatin (MYCOSTATIN/NYSTOP) powder Apply 1 Application topically 3 (three) times daily. Under abdomen skin fold 15 g 2   Probiotic Product (PROBIOTIC ADVANCED PO) Take by mouth.  zolpidem (AMBIEN) 10 MG tablet Take 10 mg by mouth at bedtime.     tiZANidine (ZANAFLEX) 4 MG tablet Take 1 tablet by mouth at bedtime as needed.  0   buPROPion (WELLBUTRIN XL) 150 MG 24 hr tablet Take 300 mg by mouth daily. Take three tablets by mouth every morning     omeprazole (PRILOSEC) 20 MG capsule Take 20 mg by mouth 2 (two) times daily before a meal.     No facility-administered medications prior to visit.     Per HPI unless specifically indicated in ROS section below Review of Systems  Constitutional:  Negative for fatigue and fever.  HENT:  Negative for congestion.   Eyes:  Negative for pain.  Respiratory:  Negative for cough and shortness of breath.   Cardiovascular:  Negative for chest pain, palpitations and leg swelling.  Gastrointestinal:  Negative for abdominal pain.  Genitourinary:  Negative for dysuria and vaginal  bleeding.  Musculoskeletal:  Positive for arthralgias. Negative for back pain.  Skin:  Positive for rash.  Neurological:  Negative for syncope, light-headedness and headaches.  Psychiatric/Behavioral:  Negative for dysphoric mood.    Objective:  BP 120/72 (BP Location: Right Arm, Patient Position: Sitting, Cuff Size: Large)   Pulse 67   Temp 98.4 F (36.9 C) (Temporal)   Ht 5' 1.5" (1.562 m)   Wt 219 lb 2 oz (99.4 kg)   SpO2 97%   BMI 40.73 kg/m   Wt Readings from Last 3 Encounters:  08/09/23 219 lb 2 oz (99.4 kg)  07/05/23 219 lb (99.3 kg)  07/02/23 222 lb (100.7 kg)      Physical Exam Constitutional:      General: She is not in acute distress.    Appearance: Normal appearance. She is well-developed. She is obese. She is not ill-appearing or toxic-appearing.  HENT:     Head: Normocephalic.     Right Ear: Hearing, tympanic membrane, ear canal and external ear normal. Tympanic membrane is not erythematous, retracted or bulging.     Left Ear: Hearing, tympanic membrane, ear canal and external ear normal. Tympanic membrane is not erythematous, retracted or bulging.     Nose: No mucosal edema or rhinorrhea.     Right Sinus: No maxillary sinus tenderness or frontal sinus tenderness.     Left Sinus: No maxillary sinus tenderness or frontal sinus tenderness.     Mouth/Throat:     Mouth: Oropharynx is clear and moist and mucous membranes are normal.     Pharynx: Uvula midline.  Eyes:     General: Lids are normal. Lids are everted, no foreign bodies appreciated.     Extraocular Movements: EOM normal.     Conjunctiva/sclera: Conjunctivae normal.     Pupils: Pupils are equal, round, and reactive to light.  Neck:     Thyroid: No thyroid mass or thyromegaly.     Vascular: No carotid bruit.     Trachea: Trachea normal.  Cardiovascular:     Rate and Rhythm: Normal rate and regular rhythm.     Pulses: Normal pulses.     Heart sounds: Normal heart sounds, S1 normal and S2 normal. No  murmur heard.    No friction rub. No gallop.  Pulmonary:     Effort: Pulmonary effort is normal. No tachypnea or respiratory distress.     Breath sounds: Normal breath sounds. No decreased breath sounds, wheezing, rhonchi or rales.  Abdominal:     General: Bowel sounds are normal.  Palpations: Abdomen is soft.     Tenderness: There is no abdominal tenderness.  Musculoskeletal:     Cervical back: Normal range of motion and neck supple.     Left ankle: Tenderness present over the lateral malleolus. No medial malleolus, ATF ligament, AITF ligament, posterior TF ligament, base of 5th metatarsal or proximal fibula tenderness. Decreased range of motion. Anterior drawer test negative. Normal pulse.  Skin:    General: Skin is warm, dry and intact.     Findings: No rash.  Neurological:     Mental Status: She is alert.  Psychiatric:        Mood and Affect: Mood is not anxious or depressed.        Speech: Speech normal.        Behavior: Behavior normal. Behavior is cooperative.        Thought Content: Thought content normal.        Cognition and Memory: Cognition and memory normal.        Judgment: Judgment normal.       Results for orders placed or performed during the hospital encounter of 06/29/23  Comprehensive metabolic panel  Result Value Ref Range   Sodium 140 135 - 145 mmol/L   Potassium 4.3 3.5 - 5.1 mmol/L   Chloride 105 98 - 111 mmol/L   CO2 26 22 - 32 mmol/L   Glucose, Bld 114 (H) 70 - 99 mg/dL   BUN 17 8 - 23 mg/dL   Creatinine, Ser 8.65 (H) 0.44 - 1.00 mg/dL   Calcium 9.1 8.9 - 78.4 mg/dL   Total Protein 7.3 6.5 - 8.1 g/dL   Albumin 4.1 3.5 - 5.0 g/dL   AST 25 15 - 41 U/L   ALT 16 0 - 44 U/L   Alkaline Phosphatase 69 38 - 126 U/L   Total Bilirubin 0.6 0.3 - 1.2 mg/dL   GFR, Estimated 53 (L) >60 mL/min   Anion gap 9 5 - 15  CBC with Differential  Result Value Ref Range   WBC 6.1 4.0 - 10.5 K/uL   RBC 4.04 3.87 - 5.11 MIL/uL   Hemoglobin 12.0 12.0 - 15.0 g/dL    HCT 69.6 29.5 - 28.4 %   MCV 92.3 80.0 - 100.0 fL   MCH 29.7 26.0 - 34.0 pg   MCHC 32.2 30.0 - 36.0 g/dL   RDW 13.2 44.0 - 10.2 %   Platelets 226 150 - 400 K/uL   nRBC 0.0 0.0 - 0.2 %   Neutrophils Relative % 65 %   Neutro Abs 4.0 1.7 - 7.7 K/uL   Lymphocytes Relative 22 %   Lymphs Abs 1.3 0.7 - 4.0 K/uL   Monocytes Relative 8 %   Monocytes Absolute 0.5 0.1 - 1.0 K/uL   Eosinophils Relative 3 %   Eosinophils Absolute 0.2 0.0 - 0.5 K/uL   Basophils Relative 1 %   Basophils Absolute 0.1 0.0 - 0.1 K/uL   Immature Granulocytes 1 %   Abs Immature Granulocytes 0.03 0.00 - 0.07 K/uL  Magnesium  Result Value Ref Range   Magnesium 2.1 1.7 - 2.4 mg/dL  TSH  Result Value Ref Range   TSH 5.243 (H) 0.350 - 4.500 uIU/mL  T4, free  Result Value Ref Range   Free T4 1.40 (H) 0.61 - 1.12 ng/dL  CBG monitoring, ED  Result Value Ref Range   Glucose-Capillary 99 70 - 99 mg/dL   Comment 1 Notify RN    Comment 2 Document in Chart  Troponin I (High Sensitivity)  Result Value Ref Range   Troponin I (High Sensitivity) 168 (HH) <18 ng/L  Troponin I (High Sensitivity)  Result Value Ref Range   Troponin I (High Sensitivity) 164 (HH) <18 ng/L    Assessment and Plan  Acute left ankle pain Assessment & Plan: Acute, no red flags and no clear indication for imaging. Most likely secondary to osteoarthritis flare in setting of obesity versus acute tendinitis/ankle strain. NSAIDs are contraindicated given on blood thinner. Will increase topical diclofenac to 4 times daily, continue ice and elevate.  Limit weightbearing exercise but will forward with some physical therapy.   Rash Assessment & Plan: Acute, patient concerns that this could be early shingles outbreak but rash is not clearly typical of this.  Appears to like it could be a scratched skin lesion now with small open wound. Given her history of shingles outbreak in a similar location, I will treat empirically with valacyclovir 3 times daily  for 7 days. I will also have her use topical Neosporin ointment on the area to assist with healing and protect from bacterial infection.   Other orders -     valACYclovir HCl; Take 1 tablet (1,000 mg total) by mouth 3 (three) times daily.  Dispense: 21 tablet; Refill: 0    No follow-ups on file.   Kerby Nora, MD

## 2023-08-09 NOTE — Assessment & Plan Note (Signed)
Acute, patient concerns that this could be early shingles outbreak but rash is not clearly typical of this.  Appears to like it could be a scratched skin lesion now with small open wound. Given her history of shingles outbreak in a similar location, I will treat empirically with valacyclovir 3 times daily for 7 days. I will also have her use topical Neosporin ointment on the area to assist with healing and protect from bacterial infection.

## 2023-08-09 NOTE — Patient Instructions (Signed)
Increase voltaren gel to 4 times daily. Continue ice and elevation.  Call if not improving as expected or if new redness and heat in joint  for further elevation.  Apply topical antibiotics cream to rash. Start 7 days of valacyclovir for possible shingles outbreak.

## 2023-08-09 NOTE — Assessment & Plan Note (Signed)
Acute, no red flags and no clear indication for imaging. Most likely secondary to osteoarthritis flare in setting of obesity versus acute tendinitis/ankle strain. NSAIDs are contraindicated given on blood thinner. Will increase topical diclofenac to 4 times daily, continue ice and elevate.  Limit weightbearing exercise but will forward with some physical therapy.

## 2023-08-19 ENCOUNTER — Encounter: Payer: Self-pay | Admitting: *Deleted

## 2023-08-22 ENCOUNTER — Telehealth: Payer: Self-pay | Admitting: Family Medicine

## 2023-08-22 DIAGNOSIS — M25572 Pain in left ankle and joints of left foot: Secondary | ICD-10-CM

## 2023-08-22 NOTE — Telephone Encounter (Signed)
Patient called in and stated that she is still having some issues with her ankle after seeing DR. Bedsole. She was wanting to know if she could get a referral to [facility] to have her ankle looked at. Thank you!

## 2023-08-23 NOTE — Addendum Note (Signed)
Addended by: Damita Lack on: 08/23/2023 08:24 AM   Modules accepted: Orders

## 2023-08-23 NOTE — Addendum Note (Signed)
Addended by: Damita Lack on: 08/23/2023 09:07 AM   Modules accepted: Orders

## 2023-08-23 NOTE — Telephone Encounter (Signed)
Spoke with Ms. Mccarthy.  She prefers [facility] location.  Referral placed in Epic.

## 2023-09-02 ENCOUNTER — Ambulatory Visit: Payer: Medicare HMO | Admitting: Psychiatry

## 2023-09-03 ENCOUNTER — Ambulatory Visit (INDEPENDENT_AMBULATORY_CARE_PROVIDER_SITE_OTHER): Payer: Medicare HMO | Admitting: Family Medicine

## 2023-09-03 ENCOUNTER — Encounter: Payer: Self-pay | Admitting: Family Medicine

## 2023-09-03 VITALS — BP 134/82 | HR 59 | Temp 97.9°F | Resp 16 | Ht 61.5 in | Wt 221.5 lb

## 2023-09-03 DIAGNOSIS — M25552 Pain in left hip: Secondary | ICD-10-CM | POA: Diagnosis not present

## 2023-09-03 MED ORDER — PREDNISONE 20 MG PO TABS
20.0000 mg | ORAL_TABLET | Freq: Every day | ORAL | 0 refills | Status: AC
Start: 2023-09-03 — End: 2023-09-08

## 2023-09-03 NOTE — Patient Instructions (Signed)
It was a pleasure meeting you today. Thank you for allowing me to take part in your health care.  Our goals for today as we discussed include:  Take Prednisone 20 mg daily for 5 days Heat/Ice as needed  Tylenol as needed Continue to remain mobile Follow up with Orthopedics Recommend calling to reschedule physical therapy   If you have any questions or concerns, please do not hesitate to call the office at [phone].  I look forward to our next visit and until then take care and stay safe.  Regards,   Dana Allan, MD   [facility]

## 2023-09-03 NOTE — Progress Notes (Signed)
SUBJECTIVE:   Chief Complaint  Patient presents with   Shoulder Pain    Started Saturday   Hip Pain    Woke up yesterday am and not able to walk. Just got back from [facility] on Friday and it was a 3 hour flight   HPI Presents to clinic for acute visit  Left hip pain  Symptom started yesterday after awakening.  Recently out of town for and returned from [place].  # hr flight.  Symptoms started 2-3 days after flight.  Initially felt tired and sore but woke up yesterday and reports could hardly walk.  Took Tylenol which did not help. Denies any fevers, weakness, numbness, tingling, recent falls, trauma.  Reports had recent xrays at [facility] and was noted to have bilateral osteoarthritis and was recommended to have PT.  She reports she has not started therapy.  Symptoms today have slightly improved and she is able to walk with cane.     PERTINENT PMH / PSH: As above.  OBJECTIVE:  BP 134/82   Pulse (!) 59   Temp 97.9 F (36.6 C)   Resp 16   Ht 5' 1.5" (1.562 m)   Wt 221 lb 8 oz (100.5 kg)   SpO2 98%   BMI 41.17 kg/m    Physical Exam Musculoskeletal:     Right hip: No deformity, tenderness, bony tenderness or crepitus. Normal range of motion.     Left hip: Tenderness present. No deformity, bony tenderness or crepitus. Normal range of motion.     Right upper leg: Normal.     Left upper leg: Normal.     Right knee: Normal.     Left knee: Normal.        09/09/2023   11:31 AM 09/03/2023    9:45 AM 07/05/2023   12:25 PM 07/02/2023    8:12 AM 05/27/2023   11:41 AM  Depression screen PHQ 2/9  Decreased Interest 0 1 2 1  0  Down, Depressed, Hopeless 0 1 2 0 0  PHQ - 2 Score 0 2 4 1  0  Altered sleeping 0 0 0 0   Tired, decreased energy 1 0 2 0   Change in appetite 0 0 0 0   Feeling bad or failure about yourself  1 1 1  0   Trouble concentrating 1 0 1 0   Moving slowly or fidgety/restless 0 0 1 0   Suicidal thoughts 0  0 0   PHQ-9 Score 3 3 9 1    Difficult doing work/chores Not  difficult at all Somewhat difficult Not difficult at all Not difficult at all       09/09/2023   11:31 AM 09/03/2023    9:45 AM 07/05/2023   12:25 PM 07/02/2023    8:12 AM  GAD 7 : Generalized Anxiety Score  Nervous, Anxious, on Edge 0 0 3 2  Control/stop worrying 1 1 3 2   Worry too much - different things 2 1 3 2   Trouble relaxing 1 1 2  0  Restless 0 0 1 0  Easily annoyed or irritable 1 1 2 2   Afraid - awful might happen 2 1 3 2   Total GAD 7 Score 7 5 17 10   Anxiety Difficulty Somewhat difficult Somewhat difficult Somewhat difficult Somewhat difficult    ASSESSMENT/PLAN:  Pain of left hip Assessment & Plan: Suspect OA given.  Trial Prednisone 20 mg daily x 5 days Heat/Ice as needed Follow up with PT as recommenced   Orders: -  predniSONE; Take 1 tablet (20 mg total) by mouth daily with breakfast for 5 days.  Dispense: 5 tablet; Refill: 0   PDMP reviewed  Return if symptoms worsen or fail to improve, for PCP.  Dana Allan, MD

## 2023-09-05 ENCOUNTER — Ambulatory Visit (INDEPENDENT_AMBULATORY_CARE_PROVIDER_SITE_OTHER): Payer: Medicare HMO | Admitting: Psychology

## 2023-09-05 DIAGNOSIS — F3181 Bipolar II disorder: Secondary | ICD-10-CM | POA: Diagnosis not present

## 2023-09-05 NOTE — Progress Notes (Signed)
[facility] Counselor/Therapist Progress Note  Patient ID: [name] [name], MRN: [id number],    Date: 09/05/2023  Time Spent: 58 minutes  Time in: 3:00 Time out : 3:58  Treatment Type: Individual Therapy  Reported Symptoms: none  Mental Status Exam: Appearance:  Casual     Behavior: Appropriate  Motor: Normal  Speech/Language:  normal  Affect: Appropriate  Mood: pleasant  Thought process: normal  Thought content:   WNL  Sensory/Perceptual disturbances:   WNL  Orientation: oriented to person, place, time/date, and situation  Attention: Good  Concentration: Good  Memory: WNL  Fund of knowledge:  Good  Insight:   Good  Judgment:  Good  Impulse Control: Good   Risk Assessment: Danger to Self:  No Self-injurious Behavior: No Danger to Others: No Duty to Warn:no Physical Aggression / Violence:No  Access to Firearms a concern: No  Gang Involvement:No   Subjective: The patient attended a face-to-face individual therapy session via video visit today.  The patient agreed and consented for this session to be on caregility and patient is aware of limitations of telehealth.  The patient was in her home alone and the therapist was in the office.  The patient presents with a blunted affect and mood is pleasant.  The patient reports that she and her husband had a nice trip to [place] to see her daughter and her family.  She reports that while they were there they looked at 55+ communities and looked at houses.  We talked about this further and I encouraged her to make sure that she has a conversation with her daughter so that she can make sure that that is the right thing for them to do to move closer to her daughter.  We talked about the importance of communication in that process.  The patient and her husband are also getting ready to go on a cruise on October 4 and she is looking forward to that.  She states that she has been having some hip problems so she has been  nursing her hip and has not been going to exercise very much.  The patient is doing very well at present and it seems that some of her health issues have resolved to some degree.  We made an appointment for a check-in next month. Interventions: Cognitive Behavioral Therapy, meditation and mindfulness  Diagnosis:Bipolar II disorder (HCC)  Plan: Client Abilities/Strengths  intelligent, insightful  Client Treatment Preferences  Outpatient individual therapy  Client Statement of Needs  "I need someone to help monitor and manage my bipolar"  Treatment Level  Outpatient individual therapy  Symptoms  Depressed or irritable mood.: (Status: improved). Diminished interest in or  enjoyment of activities.: (Status: improved). Displays a poor attention span and is easily distracted.:  (Status: improved). Exhibits an abnormally and  persistently elevated, expansive, or irritable mood with at least three symptoms of mania (i.e., inflated  self-esteem or grandiosity, decreased need for sleep, pressured speech, flight of ideas, distractibility,  excessive goal-directed activity or psychomotor agitation, excessive involvement in pleasurable, highrisk behavior).:  (Status: improved). Feelings of hopelessness, worthlessness, or inappropriate guilt.: (Status: improved). History of at least one hypomanic,  manic, or mixed mood episode.:  (Status: maintained). History of chronic or  recurrent depression for which the client has taken antidepressant medication, been hospitalized, had  outpatient treatment, or had a course of electroconvulsive therapy.:  (Status:  maintained). Lack of energy.: (Status: improved). Lacks follow-through in  projects, even though energy is very high,  since behavior lacks discipline and goal-directedness.:  (Status: improved). Poor concentration and indecisiveness.:  (Status: improved). Reports flight of ideas or thoughts racing.: (Status: improved). Shows evidence of a decreased need  for sleep.: (Status: improved).  Sleeplessness or hypersomnia.:  (Status: improved). Social withdrawal.: (Status: improved). Suicidal thoughts and/or gestures.:   (Status: improved). The elevated mood or irritability (mania) causes marked impairment in  occupational functioning, social activities, or relationships with others.: (Status: improved). Verbalizes grandiose ideas and/or persecutory beliefs.:  (Status: improved).  Problems Addressed  Bipolar Disorder - Mania, Bipolar Disorder - Depression, Bipolar Disorder - Mania, Bipolar Disorder -  Mania, Bipolar Disorder - Depression, Bipolar Disorder - Depression, Bipolar Disorder - Mania  Goals 1. Achieve controlled behavior, moderated mood, more deliberative  speech and thought process, and a stable daily activity pattern. 2. Achieve controlled behavior, moderated mood, more deliberative  speech and thought process, and a stable daily activity pattern. 3. Alleviate manic/hypomanic mood and return to previous level of  effective functioning. 4. Develop healthy cognitive patterns and beliefs about self and the world  that lead to alleviation and help prevent the relapse of  manic/hypomanic episodes. Objective Maintain a pattern of regular rhythm to daily activities. Target Date: 2024-05-07 Frequency: Monthly Progress: 80 Modality: individual Related Interventions 1. Assist the client in establishing a more routine pattern of daily activities such as sleeping,  eating, solitary and social activities, and exercise; use and review a form to schedule, assess,  and modify these activities so that they occur in a predictable rhythm every day. Objective Identify and replace thoughts and behaviors that trigger manic or depressive symptoms. Target Date: 2024-05-07 Frequency: Monthly Progress: 80 Modality: individual Related Interventions 1. Use cognitive therapy techniques to explore and educate the client about cognitive biases that  trigger  his/her elevated or depressive mood (see Cognitive Therapy for Bipolar Disorder by  Aggie Hacker al.). 2. Teach the client cognitive-behavioral coping and relapse prevention skills including delaying  impulsive actions, structured scheduling of daily activities, keeping a regular sleep routine,  avoiding unrealistic goal striving, using relaxation procedures, identifying and avoiding episode triggers such as stimulant drug use, alcohol consumption, breaking sleep routine, or exposing  self to high stress (see Cognitive Therapy for Bipolar Disorder by Aggie Hacker al.). 3. Use cognitive therapy techniques to explore and educate the client's about cognitive biases that  trigger his/her elevated or depressive mood (see Cognitive Therapy for Bipolar Disorder by  Aggie Hacker al.). Objective Discuss and resolve troubling personal and interpersonal issues. Target Date: 2024-05-07 Frequency: Monthly Progress: 60 Modality: individual Objective Participate in periodic "maintenance" sessions. Target Date: 2024-05-07 Frequency: Monthly Progress: 80 Modality: individual Related Interventions 1. Hold periodic "maintenance" sessions within the first few months after therapy to facilitate the  client's positive changes; problem-solve obstacles to improvement. 2. Hold periodic "maintenance" sessions within the first few months after therapy to facilitate the  client's positive changes; problem-solve obstacles to improvement. Objective Describe mood state, energy level, amount of control over thoughts, and sleeping pattern. Target Date: 2024-05-07 Frequency: Monthly Progress: 80 Modality: individual Related Interventions 1. Encourage the client to share his/her thoughts and feelings; express empathy and build rapport  while assessing primary cognitive, behavioral, interpersonal, or other symptoms of the mood  disorder. 2. Assess presence, severity, and impact of past and present mood episodes on social,  occupational,  and interpersonal functioning; supplement with semi-structured inventory, if  desired (e.g., Montgomery-Asberg Depression Rating Scale, Inventory to Diagnose  Depression). 3. Encourage the client to share  his/her thoughts and feelings; express empathy, and build rapport  while assessing primary cognitive, behavioral, interpersonal, or other symptoms of the mood  disorder. 4. Assess presence, severity, and impact of past and present mood episodes including mania (i.e.,  pressured speech, impulsive behavior, euphoric mood, flight of ideas, reduced need for sleep,  inflated self-esteem, and high energy) on social, occupational, and interpersonal functioning;  supplement with semi-structured inventory, if desired (e.g., Young Mania Rating Scale; the  Clinical Monitoring Form); re-administer as indicated to assess treatment response. 5. Develop healthy cognitive patterns and beliefs about self and the world  that lead to alleviation and help prevent the relapse of mood  episodes. 6. Normalize energy level and return to usual activities, good judgment,  stable mood, more realistic expectations, and goal-directed behavior. 7. Normalize energy level and return to usual activities, good judgment,  stable mood, more realistic expectations, and goal-directed behavior. Diagnosis 296.52 (Bipolar I disorder, most recent episode (or current) depressed, moderate)  Medications  1. Ambien (Dosage: 10mg  qhs)  2. Klonopin (Dosage: 1mg  TID)  3. Olanzapine (Dosage: 5 mg every other day and not on weekends)  4. Prozac (Dosage: 20 mg every other day and not on weekends)  5. Wellbutrin (Dosage: 450 mg qd)  Conditions For Discharge Achievement of treatment goals and objectives  The patient approved this treatment plan and is making progress towards goals.  Daymian Lill G Weda Baumgarner,  LCSW

## 2023-09-09 ENCOUNTER — Ambulatory Visit (INDEPENDENT_AMBULATORY_CARE_PROVIDER_SITE_OTHER): Payer: Medicare HMO | Admitting: Family Medicine

## 2023-09-09 ENCOUNTER — Encounter: Payer: Self-pay | Admitting: Family Medicine

## 2023-09-09 VITALS — BP 120/74 | HR 67 | Temp 98.2°F | Ht 61.5 in | Wt 214.5 lb

## 2023-09-09 DIAGNOSIS — M5137 Other intervertebral disc degeneration, lumbosacral region: Secondary | ICD-10-CM | POA: Diagnosis not present

## 2023-09-09 DIAGNOSIS — Z8719 Personal history of other diseases of the digestive system: Secondary | ICD-10-CM | POA: Diagnosis not present

## 2023-09-09 DIAGNOSIS — M170 Bilateral primary osteoarthritis of knee: Secondary | ICD-10-CM | POA: Diagnosis not present

## 2023-09-09 DIAGNOSIS — M7750 Other enthesopathy of unspecified foot: Secondary | ICD-10-CM

## 2023-09-09 DIAGNOSIS — M51379 Other intervertebral disc degeneration, lumbosacral region without mention of lumbar back pain or lower extremity pain: Secondary | ICD-10-CM

## 2023-09-09 DIAGNOSIS — I480 Paroxysmal atrial fibrillation: Secondary | ICD-10-CM | POA: Diagnosis not present

## 2023-09-09 NOTE — Assessment & Plan Note (Signed)
Sees [facility] cardiology   From last a/p  Hx atrial fibrillation/flutter: S/p ablation 06/26/2023. Echo 06/2023 with EF >55%, mild MR and TR. -Continue Eliquis 5 mg twice daily for stroke risk reduction.   Blood pressure and pulse are normal today

## 2023-09-09 NOTE — Assessment & Plan Note (Signed)
More constipation when she takes calcium  Then gets anal fissures Encouraged to hold the calcium and just take vitamin D Continue healthy diet /fiber and probiotic if helpful

## 2023-09-09 NOTE — Assessment & Plan Note (Signed)
Mobility is affected  Handicapped parking form filled out today

## 2023-09-09 NOTE — Assessment & Plan Note (Signed)
Ongoing problem with pain  Encouraged to use cane if needed  Has a specialist appointment upcoming soon   Handicapped parking form filled out today

## 2023-09-09 NOTE — Patient Instructions (Addendum)
I recommend a covid vaccine at the pharmacy   Desitin or A and D (barrier creams or ointments) are good to use if you have a rectal tear/ fissure to help it heal   Stop calcium if it makes constipation bad    Take the paper to the [facility] for your handicapped parking    Have a great trip !

## 2023-09-09 NOTE — Assessment & Plan Note (Signed)
Prone to these when constipated and straining  Discussed efforts to keep stools soft (fiber/fluids)  Plans to hold her ca and just take vit d  Encouraged to keep a barrier cream or ointment on hand -see avs

## 2023-09-09 NOTE — Assessment & Plan Note (Signed)
Seeing orthopedics  Handicapped parking form filled out today

## 2023-09-09 NOTE — Progress Notes (Signed)
Subjective:    Patient ID: [name] [name], female    DOB: 06/05/1956, 67 y.o.   MRN: [id number]  HPI  Wt Readings from Last 3 Encounters:  09/09/23 214 lb 8 oz (97.3 kg)  09/03/23 221 lb 8 oz (100.5 kg)  08/09/23 219 lb 2 oz (99.4 kg)   39.87 kg/m  Vitals:   09/09/23 1118  BP: 120/74  Pulse: 67  Temp: 98.2 F (36.8 C)  SpO2: 97%    Pt made appointment for sore spot on bottom- but this resolved     Question about covid vaccine   Needs handicapped parking form   Going on a cruise Friday  (excited)  Should she get a covid vaccine   Had her flu shot  Utd with covid shot    Needs handicapped parking  Chronic back and knee and ankle  Difficulty getting out of the car  Trouble walking the parking lot  Has cane in case she needs it   She thinks she had a rectal fissure - happened after straining  Used anusol  Better now    Still goes to [employer]  Does her program in the chair   Has appointment with [facility] soon  Took prednisone and it really helped     Patient Active Problem List   Diagnosis Date Noted   History of anal fissures 09/09/2023   Acute left ankle pain 08/09/2023   Osteopenia 07/30/2023   Headache 07/05/2023   Paroxysmal atrial fibrillation (HCC) 06/27/2023   Bradycardia 05/30/2023   Melanoma in situ (HCC) 12/25/2022   ASCUS of cervix with negative high risk HPV 06/25/2022   Osteoarthritis of knee 05/25/2022   Hemorrhoid 04/25/2022   Estrogen deficiency 01/02/2022   Bipolar disorder, in partial remission, most recent episode mixed (HCC) 02/25/2020   Elevated glucose level 03/03/2018   Atrophic vaginitis 07/08/2017   Cervical stenosis (uterine cervix) 01/03/2017   Hearing loss 10/25/2015   Routine general medical examination at a health care facility 10/17/2015   Rash 02/22/2015   Decreased GFR 10/22/2014   Colon cancer screening 10/16/2013   METATARSALGIA 12/01/2009   Enthesopathy of ankle and tarsus 12/01/2009    POSTMENOPAUSAL STATUS 09/30/2009   ARTHRITIS, RIGHT HIP 03/10/2009   DEGENERATIVE DISC DISEASE, LUMBAR SPINE 03/10/2009   DDD (degenerative disc disease), lumbosacral 03/10/2009   ONYCHOMYCOSIS 09/17/2008   Hypothyroidism 08/05/2007   Morbid obesity (HCC) 08/05/2007   Bipolar disorder (HCC) 08/05/2007   ALLERGIC RHINITIS 08/05/2007   IBS 08/05/2007   GERD (gastroesophageal reflux disease) 08/05/2007   HYPERCHOLESTEROLEMIA, PURE 07/07/2007   Past Medical History:  Diagnosis Date   Actinic keratosis 03/29/2021   R central upper lip   Allergic rhinitis    Allergy    Anxiety    Arthritis    Atypical mole 12/26/2021   spinal upper back, exc 01/15/22   Basal cell carcinoma 10/16/2010   R lower pretibia    Bipolar 1 disorder (HCC)    Depression    Fissure, anal    GERD (gastroesophageal reflux disease)    History of basal cell carcinoma excision    ON FACE   History of squamous cell carcinoma excision    ON RIGHT LEG   Hyperlipidemia    Hypothyroidism    IBS (irritable bowel syndrome)    Liver cyst    Malignant melanoma in situ (HCC) 11/20/2022   Right labia minora. Excision 12/25/2022 at anterior vulva, left of clitoral hood, margins involved. Surgery scheduled for 02/12/2023.  Mallet finger of left hand    small finger   Pre-diabetes    Past Surgical History:  Procedure Laterality Date   CARDIAC CATHETERIZATION  04/10/1999   per pt normal   CARDIOVASCULAR STRESS TEST  02-23-2011  dr Iantha Fallen f[facility] clinic)   normal nuclear study/  no ischemia/  ef 64%   CATARACT EXTRACTION W/ INTRAOCULAR LENS  IMPLANT, BILATERAL  12/10/2001   Bil   CESAREAN SECTION  12/10/1982   CLOSED REDUCTION FINGER WITH PERCUTANEOUS PINNING Left 04/22/2014   Procedure: LEFT SMALL FINGER CLOSED REDUCTION PINNING;  Surgeon: Sharma Covert, MD;  Location: [facility] [place];  Service: Orthopedics;  Laterality: Left;   COLONOSCOPY  11/09/2002   DILATION AND CURETTAGE OF UTERUS  1977    LAPAROSCOPIC CHOLECYSTECTOMY  03/18/2000   RECONSTRUCTION OF EYELID Bilateral 08/2022   TRANSTHORACIC ECHOCARDIOGRAM  02/23/2011   normal lvf/  ef 58%/  mild rve/   mild lae/  mild  mr  & tr/  mild pulmonary htn   VULVECTOMY N/A 12/25/2022   Procedure: PARTIAL  VULVECTOMY;  Surgeon: [name], MD;  Location: [facility] [place];  Service: Gynecology;  Laterality: N/A;   VULVECTOMY N/A 02/12/2023   Procedure: WIDE LOCAL EXCISION OF VULVA AND REMOVAL OF CLITORIS;  Surgeon: [name], MD;  Location: [facility] [place];  Service: Gynecology;  Laterality: N/A;   WISDOM TOOTH EXTRACTION     Social History   Tobacco Use   Smoking status: Never   Smokeless tobacco: Never  Vaping Use   Vaping status: Never Used  Substance Use Topics   Alcohol use: Yes    Comment: Only on cruises or rarely   Drug use: No   Family History  Problem Relation Age of Onset   Stroke Mother    Heart disease Mother    Hypertension Father    Diabetes Father    Hyperlipidemia Father    Breast cancer Sister    Breast cancer Sister    Diabetes Brother    Colon cancer Neg Hx    Ovarian cancer Neg Hx    Endometrial cancer Neg Hx    Pancreatic cancer Neg Hx    Prostate cancer Neg Hx    Allergies  Allergen Reactions   Ingrezza [Valbenazine Tosylate] Other (See Comments)    Night sweats, worsened tardive dyskinesia, nightmares   Lidocaine Swelling    REACTION: swelling ALL CAINE DRUGS EXCEPT: marcaine & sensercaine Other reaction(s): Edema   Xylocaine [Lidocaine Hcl] Swelling    ALLERGIC TO ALL CAINES EXCEPT SENSORCAINE , AND MARCAINE   Amantadine    Austedo Xr [Deutetrabenazine]     Caused Muscle aches and tremors   Codeine Nausea And Vomiting    REACTION: vomits blood   Lithium Swelling   Tegretol [Carbamazepine] Other (See Comments)    REACTION: skin crawls   Current Outpatient Medications on File Prior to Visit  Medication Sig Dispense Refill   apixaban (ELIQUIS) 5 MG  TABS tablet Take 5 mg by mouth 2 (two) times daily.     ARIPiprazole (ABILIFY) 10 MG tablet Take 10 mg by mouth daily.     BIOTIN PO Take 1 tablet by mouth daily.     buPROPion (WELLBUTRIN XL) 300 MG 24 hr tablet Take 300 mg by mouth daily.     cetirizine (ZYRTEC ALLERGY) 10 MG tablet Take 10 mg by mouth daily as needed for allergies.     Cholecalciferol (VITAMIN D) 2000 UNITS CAPS Take 1 capsule  by mouth daily.     clonazePAM (KLONOPIN) 1 MG tablet Take 1 mg by mouth at bedtime.     cyanocobalamin (VITAMIN B12) 1000 MCG tablet Take by mouth.     famotidine (PEPCID) 20 MG tablet Take 1 tablet (20 mg total) by mouth 2 (two) times daily. 180 tablet 1   fluticasone (FLONASE) 50 MCG/ACT nasal spray Place into the nose.     hydrocortisone (ANUSOL-HC) 2.5 % rectal cream Apply 1 application. topically 2 (two) times daily. To affected area twice daily (around anus/ hemorrhoid) 30 g 0   lamoTRIgine (LAMICTAL) 150 MG tablet Take 1 tablet (150 mg total) by mouth daily.     levothyroxine (SYNTHROID) 50 MCG tablet TAKE 1 TABLET EVERY DAY BEFORE BREAKFAST 90 tablet 2   lisinopril (ZESTRIL) 10 MG tablet Take 10 mg by mouth daily.     lovastatin (MEVACOR) 40 MG tablet TAKE 1 TABLET EVERY DAY 90 tablet 2   metroNIDAZOLE (METROGEL) 1 % gel Apply topically daily. To nose, cheeks for rosacea 45 g 2   nystatin (MYCOSTATIN/NYSTOP) powder Apply 1 Application topically 3 (three) times daily. Under abdomen skin fold 15 g 2   Probiotic Product (PROBIOTIC ADVANCED PO) Take by mouth.     zolpidem (AMBIEN) 10 MG tablet Take 10 mg by mouth at bedtime.     No current facility-administered medications on file prior to visit.    Review of Systems  Constitutional:  Negative for activity change, appetite change, fatigue, fever and unexpected weight change.  HENT:  Negative for congestion, ear pain, rhinorrhea, sinus pressure and sore throat.   Eyes:  Negative for pain, redness and visual disturbance.  Respiratory:  Negative  for cough, shortness of breath and wheezing.   Cardiovascular:  Negative for chest pain and palpitations.  Gastrointestinal:  Positive for constipation. Negative for abdominal pain, blood in stool and diarrhea.       Had rectal fissure Better now   Endocrine: Negative for polydipsia and polyuria.  Genitourinary:  Negative for dysuria, frequency and urgency.  Musculoskeletal:  Positive for arthralgias, back pain, gait problem and joint swelling. Negative for myalgias.       Left ankle stays swollen  Skin:  Negative for pallor and rash.  Allergic/Immunologic: Negative for environmental allergies.  Neurological:  Negative for dizziness, syncope and headaches.  Hematological:  Negative for adenopathy. Does not bruise/bleed easily.  Psychiatric/Behavioral:  Negative for decreased concentration and dysphoric mood. The patient is not nervous/anxious.        Objective:   Physical Exam Constitutional:      General: She is not in acute distress.    Appearance: Normal appearance. She is well-developed. She is obese. She is not ill-appearing or diaphoretic.  HENT:     Head: Normocephalic and atraumatic.  Eyes:     Conjunctiva/sclera: Conjunctivae normal.     Pupils: Pupils are equal, round, and reactive to light.  Neck:     Thyroid: No thyromegaly.     Vascular: No carotid bruit or JVD.  Cardiovascular:     Rate and Rhythm: Normal rate and regular rhythm.     Heart sounds: Normal heart sounds.     No gallop.  Pulmonary:     Effort: Pulmonary effort is normal. No respiratory distress.     Breath sounds: Normal breath sounds. No wheezing or rales.  Abdominal:     General: There is no distension or abdominal bruit.     Palpations: Abdomen is soft. There is no mass.  Tenderness: There is no abdominal tenderness. There is no guarding or rebound.  Musculoskeletal:     Cervical back: Normal range of motion and neck supple.     Right lower leg: No edema.     Left lower leg: No edema.      Comments: Left ankle is less swollen than right Normal passive rom   Lymphadenopathy:     Cervical: No cervical adenopathy.  Skin:    General: Skin is warm and dry.     Coloration: Skin is not pale.     Findings: No rash.  Neurological:     Mental Status: She is alert.     Coordination: Coordination normal.     Deep Tendon Reflexes: Reflexes are normal and symmetric. Reflexes normal.  Psychiatric:        Mood and Affect: Mood normal.           Assessment & Plan:   Problem List Items Addressed This Visit       Cardiovascular and Mediastinum   Paroxysmal atrial fibrillation (HCC)    Sees [facility] cardiology   From last a/p  Hx atrial fibrillation/flutter: S/p ablation 06/26/2023. Echo 06/2023 with EF >55%, mild MR and TR. -Continue Eliquis 5 mg twice daily for stroke risk reduction.   Blood pressure and pulse are normal today         Musculoskeletal and Integument   DDD (degenerative disc disease), lumbosacral    Mobility is affected  Handicapped parking form filled out today        Enthesopathy of ankle and tarsus    Ongoing problem with pain  Encouraged to use cane if needed  Has a specialist appointment upcoming soon   Handicapped parking form filled out today      Osteoarthritis of knee    Seeing orthopedics  Handicapped parking form filled out today         Other   History of anal fissures - Primary    Prone to these when constipated and straining  Discussed efforts to keep stools soft (fiber/fluids)  Plans to hold her ca and just take vit d  Encouraged to keep a barrier cream or ointment on hand -see avs

## 2023-09-11 ENCOUNTER — Encounter: Payer: Self-pay | Admitting: Family Medicine

## 2023-09-11 DIAGNOSIS — M25552 Pain in left hip: Secondary | ICD-10-CM | POA: Insufficient documentation

## 2023-09-11 NOTE — Assessment & Plan Note (Signed)
Suspect OA given.  Trial Prednisone 20 mg daily x 5 days Heat/Ice as needed Follow up with PT as recommenced

## 2023-09-23 ENCOUNTER — Inpatient Hospital Stay: Payer: Medicare HMO | Attending: Gynecologic Oncology | Admitting: Psychiatry

## 2023-09-23 ENCOUNTER — Encounter: Payer: Self-pay | Admitting: Psychiatry

## 2023-09-23 VITALS — BP 126/58 | HR 64 | Temp 98.4°F | Wt 224.2 lb

## 2023-09-23 DIAGNOSIS — N904 Leukoplakia of vulva: Secondary | ICD-10-CM | POA: Diagnosis not present

## 2023-09-23 DIAGNOSIS — D038 Melanoma in situ of other sites: Secondary | ICD-10-CM | POA: Insufficient documentation

## 2023-09-23 NOTE — Patient Instructions (Signed)
It was a pleasure to see you in clinic today. - I will let you know the biopsy results. - Return visit planned for 3 months  Thank you very much for allowing me to provide care for you today.  I appreciate your confidence in choosing our Gynecologic Oncology team at [facility].  If you have any questions about your visit today please call our office or send [place] a MyChart message and we will get back to you as soon as possible.

## 2023-09-23 NOTE — Progress Notes (Signed)
Gynecologic Oncology Return Clinic Visit  Date of Service: 09/23/2023 Referring Provider: Nicholaus Bloom, MD   Assessment & Plan: RIANNON MUKHERJEE is a 67 y.o. woman with vulvar melanoma in situ who is s/p wide local excision of the vulva on 12/25/22, followed by re-excision and removal of clitoris on 02/12/23, who presents today for surveillance.  Melanoma in situ: - Focal positive deep margin (one focal spot on 1 single slide). All other margins negative for MIS but positive for atypical lentiginous melanocytic hyperplasia (possible precursor lesion). - NED grossly on exam today. - Two scouting biopsies obtained today. See procedure note below. Will follow-up results. - Continue surveillance with pelvic exam q26mo with scouting biopsies. If skin changes, positive biopsies, could consider treatment with aldara. - Once 1 year NED, may be able to reduce follow-up to q76mo.  RTC 53mo  [name], MD Gynecologic Oncology   Medical Decision Making I personally spent  TOTAL 25 minutes face-to-face and non-face-to-face in the care of this patient, which includes all pre, intra, and post visit time on the date of service.   ----------------------- Reason for Visit: Surveillance  Treatment History: Pt presented to her Ob/Gyn for evaluation of vulvar itching. She had noted itching the week prior, but this resolved with application of clobetasol which she had for a diagnosis of lichen simplex chronicus. However, patient had also been previously noted by another provider to have a brown vulvar lesions of her right labia minora 12/2021 which was rechecked 03/2022 and noted to be "stable" with plan for continued yearly follow-up.   On 11/12/22, her Ob/Gyn noted a 1cm brown lesion was noted "at the top of her labia minora." Given the appearance of a melanotic lesion, she was recommended to return for a biopsy, which was completed on 11/20/22. Biopsy returned with melanoma in situ, edges involved. No invasive  disease was seen.   She underwent a simple partial vulvectomy on 12/25/22 with path results: A. ANTERIOR VULVA, EXCISION: MELANOMA IN SITU, EXTENDING TO THE DEEP  SURGICAL MARGIN. THE PERIPHERAL MARGINS ARE NEGATIVE.  B. ANTERIOR VULVA, LEFT OF CLITORAL HOOD, EXCISION:  MELANOMA IN SITU  INVOLVING THE 12, 3, 6 AND 9 O'CLOCK MARGINS.   Given multiple positive margins, she returned to the OR on 02/12/23 for additional resection including a WLE of the anterior vulva including removal of remaining bilateral labia minora and removal of clitoris with pathology showing: A.   VULVA, ANTERIOR MARGIN, BIOPSY: NO MELANOMA IN SITU SEEN. COMMENT: Dr. Maryelizabeth Kaufmann is in agreement. B.   VULVA, LEFT ANTERIOR MARGIN, BIOPSY: NO MELANOMA IN SITU SEEN. COMMENT: Dr. Maryelizabeth Kaufmann is in agreement. C.   VULVA, LEFT MEDIAL MARGIN, BIOPSY: NO MELANOMA IN SITU SEEN. COMMENT: MelanA was used to evaluate this slide. Dr. Maryelizabeth Kaufmann is in agreement D.   **PRELIMINARY DIAGNOSIS** VULVA, ANTERIOR WITH LEFT/RIGHT LABIA MINORA, EXCISION: -RESIDUAL MELANOMA IN SITU , MULTFOCAL RESIDUAL FOCI , INVOLVING BLACK/DEEP MARGIN (D2), -BACKGROUND ATYPICAL JUNCTIONAL MELANOCYTIC HYPERPLASIA INVOLVING MULTIFOCAL MARGINS (ANTERIOR, OUTER LEFT, INNER LEFT MARGINS INVOLVED WITH ATYPICAL JUNCTIONAL MELANOCYTIC HYPERPLASIA, SEE COMMENT) COMMENT: Sections show extensive areas of atypical lentiginous melanocytic hyperplasia involving the margins noted above, which may be a precursor lesion to the residual melanoma in situ present. The atypical lentiginous melanocytic hyperplasia does not meet criteria for melanoma in situ. These atypical lentiginous foci are negative with PRAME, and exhibit lesser cytological atypia on MITF staining. MelanA overstains many foci and is not contributory. However, residual melanoma in situ with focal upward (pagetoid) growth, severe atypia  of melanocytes, and melanocytic confluence is seen in multifocal areas, but notably ONLY  at the margin deep inked black (block D2). The remainder of the margins are free of melanoma in situ.  E.   VULVA, LEFT LATERAL MARGIN, EXCISION: ATYPICAL JUNCTIONAL MELANOCYTIC HYPERPLASIA, NO MELANOMA IN SITU COMMENT: No melanoma in situ is seen. PRAME, MelanA, and MITF were used to evaluate this margin. Dr. Maryelizabeth Kaufmann has reviewed this case and is in agreement with the diagnoses rendered above. In managing this patient with extensive atypical lentiginous melanocytic hyperplasia, which likely represents a confluence of extensive melanotic macules and precursor disease to melanoma in situ, this reference article is provided: Charlcie Cradle. May P. [name], Shitanshu Uppai: Margin status in vulvovaginal melanoma: Management and oncologic outcomes of 50 cases. Gynecol Oncol Rep. 2023 Oct; 49: [id number]   Scouting biopsies on 06/03/23: negative   Interval History: Patient presents today with her husband.  She reports that she is overall been doing well.  Denies any vulvar bleeding, itching or new lesions that she is aware of.  She expresses her gratitude to our clinic for helping to identify her heart rate abnormality for which she has since undergone a cardiac ablation per patient report.   Past Medical/Surgical History: Past Medical History:  Diagnosis Date   Actinic keratosis 03/29/2021   R central upper lip   Allergic rhinitis    Allergy    Anxiety    Arthritis    Atypical mole 12/26/2021   spinal upper back, exc 01/15/22   Basal cell carcinoma 10/16/2010   R lower pretibia    Bipolar 1 disorder (HCC)    Depression    Fissure, anal    GERD (gastroesophageal reflux disease)    History of basal cell carcinoma excision    ON FACE   History of squamous cell carcinoma excision    ON RIGHT LEG   Hyperlipidemia    Hypothyroidism    IBS (irritable bowel syndrome)    Liver cyst    Malignant melanoma in situ (HCC) 11/20/2022   Right labia minora. Excision 12/25/2022 at anterior  vulva, left of clitoral hood, margins involved. Surgery scheduled for 02/12/2023.   Mallet finger of left hand    small finger   Pre-diabetes     Past Surgical History:  Procedure Laterality Date   CARDIAC CATHETERIZATION  04/10/1999   per pt normal   CARDIOVASCULAR STRESS TEST  02-23-2011  dr Iantha [facility] clinic)   normal nuclear study/  no ischemia/  ef 64%   CATARACT EXTRACTION W/ INTRAOCULAR LENS  IMPLANT, BILATERAL  12/10/2001   Bil   CESAREAN SECTION  12/10/1982   CLOSED REDUCTION FINGER WITH PERCUTANEOUS PINNING Left 04/22/2014   Procedure: LEFT SMALL FINGER CLOSED REDUCTION PINNING;  Surgeon: Sharma Covert, MD;  Location: [facility];  Service: Orthopedics;  Laterality: Left;   COLONOSCOPY  11/09/2002   DILATION AND CURETTAGE OF UTERUS  1977   LAPAROSCOPIC CHOLECYSTECTOMY  03/18/2000   RECONSTRUCTION OF EYELID Bilateral 08/2022   TRANSTHORACIC ECHOCARDIOGRAM  02/23/2011   normal lvf/  ef 58%/  mild rve/   mild lae/  mild  mr  & tr/  mild pulmonary htn   VULVECTOMY N/A 12/25/2022   Procedure: PARTIAL  VULVECTOMY;  Surgeon: [name], MD;  Location: [facility];  Service: Gynecology;  Laterality: N/A;   VULVECTOMY N/A 02/12/2023   Procedure: WIDE LOCAL EXCISION OF VULVA AND REMOVAL OF CLITORIS;  Surgeon: [name], MD;  Location:  [facility];  Service: Gynecology;  Laterality: N/A;   WISDOM TOOTH EXTRACTION      Family History  Problem Relation Age of Onset   Stroke Mother    Heart disease Mother    Hypertension Father    Diabetes Father    Hyperlipidemia Father    Breast cancer Sister    Breast cancer Sister    Diabetes Brother    Colon cancer Neg Hx    Ovarian cancer Neg Hx    Endometrial cancer Neg Hx    Pancreatic cancer Neg Hx    Prostate cancer Neg Hx     Social History   Socioeconomic History   Marital status: Married    Spouse name: Not on file   Number of children: 1   Years of  education: Not on file   Highest education level: Bachelor's degree (e.g., BA, AB, BS)  Occupational History   Occupation: Home    Employer: RETIRED  Tobacco Use   Smoking status: Never   Smokeless tobacco: Never  Vaping Use   Vaping status: Never Used  Substance and Sexual Activity   Alcohol use: Yes    Comment: Only on cruises or rarely   Drug use: No   Sexual activity: Not Currently    Birth control/protection: Post-menopausal  Other Topics Concern   Not on file  Social History Narrative   Moved here from [place]   Social Determinants of Health   Financial Resource Strain: Medium Risk (07/04/2023)   Overall Financial Resource Strain (CARDIA)    Difficulty of Paying Living Expenses: Somewhat hard  Food Insecurity: No Food Insecurity (07/04/2023)   Hunger Vital Sign    Worried About Running Out of Food in the Last Year: Never true    Ran Out of Food in the Last Year: Never true  Transportation Needs: No Transportation Needs (07/04/2023)   PRAPARE - Administrator, Civil Service (Medical): No    Lack of Transportation (Non-Medical): No  Physical Activity: Sufficiently Active (07/04/2023)   Exercise Vital Sign    Days of Exercise per Week: 3 days    Minutes of Exercise per Session: 60 min  Stress: Stress Concern Present (07/04/2023)   [employer] of Occupational Health - Occupational Stress Questionnaire    Feeling of Stress : Rather much  Social Connections: Socially Integrated (07/04/2023)   Social Connection and Isolation Panel [NHANES]    Frequency of Communication with Friends and Family: More than three times a week    Frequency of Social Gatherings with Friends and Family: Once a week    Attends Religious Services: More than 4 times per year    Active Member of [employer] or Organizations: Yes    Attends Engineer, structural: More than 4 times per year    Marital Status: Married    Current Medications:  Current Outpatient Medications:    apixaban  (ELIQUIS) 5 MG TABS tablet, Take 5 mg by mouth 2 (two) times daily., Disp: , Rfl:    ARIPiprazole (ABILIFY) 10 MG tablet, Take 10 mg by mouth daily., Disp: , Rfl:    BIOTIN PO, Take 1 tablet by mouth daily., Disp: , Rfl:    buPROPion (WELLBUTRIN XL) 300 MG 24 hr tablet, Take 300 mg by mouth daily., Disp: , Rfl:    cetirizine (ZYRTEC ALLERGY) 10 MG tablet, Take 10 mg by mouth daily as needed for allergies., Disp: , Rfl:    Cholecalciferol (VITAMIN D) 2000 UNITS CAPS, Take 1  capsule by mouth daily., Disp: , Rfl:    clonazePAM (KLONOPIN) 1 MG tablet, Take 1 mg by mouth at bedtime., Disp: , Rfl:    cyanocobalamin (VITAMIN B12) 1000 MCG tablet, Take by mouth., Disp: , Rfl:    famotidine (PEPCID) 20 MG tablet, Take 1 tablet (20 mg total) by mouth 2 (two) times daily., Disp: 180 tablet, Rfl: 1   fluticasone (FLONASE) 50 MCG/ACT nasal spray, Place into the nose., Disp: , Rfl:    hydrocortisone (ANUSOL-HC) 2.5 % rectal cream, Apply 1 application. topically 2 (two) times daily. To affected area twice daily (around anus/ hemorrhoid), Disp: 30 g, Rfl: 0   lamoTRIgine (LAMICTAL) 150 MG tablet, Take 1 tablet (150 mg total) by mouth daily., Disp: , Rfl:    levothyroxine (SYNTHROID) 50 MCG tablet, TAKE 1 TABLET EVERY DAY BEFORE BREAKFAST, Disp: 90 tablet, Rfl: 2   lisinopril (ZESTRIL) 10 MG tablet, Take 10 mg by mouth daily., Disp: , Rfl:    lovastatin (MEVACOR) 40 MG tablet, TAKE 1 TABLET EVERY DAY, Disp: 90 tablet, Rfl: 2   metroNIDAZOLE (METROGEL) 1 % gel, Apply topically daily. To nose, cheeks for rosacea, Disp: 45 g, Rfl: 2   nystatin (MYCOSTATIN/NYSTOP) powder, Apply 1 Application topically 3 (three) times daily. Under abdomen skin fold, Disp: 15 g, Rfl: 2   Probiotic Product (PROBIOTIC ADVANCED PO), Take by mouth., Disp: , Rfl:    zolpidem (AMBIEN) 10 MG tablet, Take 10 mg by mouth at bedtime., Disp: , Rfl:   Review of Symptoms: Complete 10-system review is positive for: Hearing loss, anxiety  Physical  Exam: BP (!) 126/58 (BP Location: Left Arm)   Pulse 64   Temp 98.4 F (36.9 C) (Oral)   Wt 224 lb 3.2 oz (101.7 kg)   SpO2 100%   BMI 41.68 kg/m  General: Alert, oriented, no acute distress. HEENT: Normocephalic, atraumatic. Neck symmetric without masses. Sclera anicteric.  Chest: Normal work of breathing. Clear to auscultation bilaterally.   Cardiovascular: Regular rate and rhythm.  Abdomen: Soft, nontender.  Extremities: Grossly normal range of motion.  Warm, well perfused.  Edema bilateral ankles Skin: No rashes or lesions noted. Lymphatics: No supraclavicular, cervical or inguinal adenopathy. GU: External genitalia with evidence of prior anterior vulvectomy, well healed. No evidence of active lesion. Scouting biopsies obtained, see below.  Exam chaperoned by Kimberly [place], CMA .  VULVAR BIOPSY  VANISSA STRENGTH is a 67 y.o. woman who presents today for a vulvar biopsy, see details of the visit above.  The procedure was explained to the patient and verbal consent was obtained prior to the procedure.  The skin was cleaned with Betadine.  1.5 ml of 0.5% marcaine was injected at the planned biopsy sites for scouting biopsies.  A 3 mm punch biopsy was used to obtain one scouting biopsy left anterior along vulvectomy scar, and one biopsy right.  Hemostasis was achieved with silver nitrate.  Patient tolerated the procedure well.   Laboratory & Radiologic Studies: Surgical pathology (06/03/23): FINAL MICROSCOPIC DIAGNOSIS:   A. LEFT ANTERIOR VULVA, BIOPSY:  Benign squamous mucosa with focal hyperkeratosis and focal melanosis  Negative for melanocytic atypia  Negative for dysplasia and diagnostic viral change   B. RIGHT POSTERIOR OF ANTERIOR VULVA, BIOPSY:  Benign squamous mucosa  Negative for melanocytic atypia  Negative for dysplasia and diagnostic viral change

## 2023-09-24 ENCOUNTER — Ambulatory Visit: Payer: Medicare HMO | Admitting: Podiatry

## 2023-09-24 DIAGNOSIS — M7672 Peroneal tendinitis, left leg: Secondary | ICD-10-CM

## 2023-09-24 NOTE — Progress Notes (Signed)
Subjective:  Patient ID: [name] [name], female    DOB: [birth year],  MRN: [id number]  No chief complaint on file.   67 y.o. female presents with the above complaint.  Patient presents with left lateral pain.  Patient states painful to touch is progressive gotten worse worse with ambulation worse with pressure she states that has been going for quite some time is causing some swelling she wanted get it evaluated pain scale 7 out of 10 dull aching nature.   Review of Systems: Negative except as noted in the HPI. Denies N/V/F/Ch.  Past Medical History:  Diagnosis Date   Actinic keratosis 03/29/2021   R central upper lip   Allergic rhinitis    Allergy    Anxiety    Arthritis    Atypical mole 12/26/2021   spinal upper back, exc 01/15/22   Basal cell carcinoma 10/16/2010   R lower pretibia    Bipolar 1 disorder (HCC)    Depression    Fissure, anal    GERD (gastroesophageal reflux disease)    History of basal cell carcinoma excision    ON FACE   History of squamous cell carcinoma excision    ON RIGHT LEG   Hyperlipidemia    Hypothyroidism    IBS (irritable bowel syndrome)    Liver cyst    Malignant melanoma in situ (HCC) 11/20/2022   Right labia minora. Excision 12/25/2022 at anterior vulva, left of clitoral hood, margins involved. Surgery scheduled for 02/12/2023.   Mallet finger of left hand    small finger   Pre-diabetes     Current Outpatient Medications:    apixaban (ELIQUIS) 5 MG TABS tablet, Take 5 mg by mouth 2 (two) times daily., Disp: , Rfl:    ARIPiprazole (ABILIFY) 10 MG tablet, Take 10 mg by mouth daily., Disp: , Rfl:    BIOTIN PO, Take 1 tablet by mouth daily., Disp: , Rfl:    buPROPion (WELLBUTRIN XL) 300 MG 24 hr tablet, Take 300 mg by mouth daily., Disp: , Rfl:    cetirizine (ZYRTEC ALLERGY) 10 MG tablet, Take 10 mg by mouth daily as needed for allergies., Disp: , Rfl:    Cholecalciferol (VITAMIN D) 2000 UNITS CAPS, Take 1 capsule by mouth daily., Disp: , Rfl:     clonazePAM (KLONOPIN) 1 MG tablet, Take 1 mg by mouth at bedtime., Disp: , Rfl:    cyanocobalamin (VITAMIN B12) 1000 MCG tablet, Take by mouth., Disp: , Rfl:    famotidine (PEPCID) 20 MG tablet, Take 1 tablet (20 mg total) by mouth 2 (two) times daily., Disp: 180 tablet, Rfl: 1   fluticasone (FLONASE) 50 MCG/ACT nasal spray, Place into the nose., Disp: , Rfl:    hydrocortisone (ANUSOL-HC) 2.5 % rectal cream, Apply 1 application. topically 2 (two) times daily. To affected area twice daily (around anus/ hemorrhoid), Disp: 30 g, Rfl: 0   lamoTRIgine (LAMICTAL) 150 MG tablet, Take 1 tablet (150 mg total) by mouth daily., Disp: , Rfl:    levothyroxine (SYNTHROID) 50 MCG tablet, TAKE 1 TABLET EVERY DAY BEFORE BREAKFAST, Disp: 90 tablet, Rfl: 2   lisinopril (ZESTRIL) 10 MG tablet, Take 10 mg by mouth daily., Disp: , Rfl:    lovastatin (MEVACOR) 40 MG tablet, TAKE 1 TABLET EVERY DAY, Disp: 90 tablet, Rfl: 2   metroNIDAZOLE (METROGEL) 1 % gel, Apply topically daily. To nose, cheeks for rosacea, Disp: 45 g, Rfl: 2   nystatin (MYCOSTATIN/NYSTOP) powder, Apply 1 Application topically 3 (three) times daily. Under abdomen  skin fold, Disp: 15 g, Rfl: 2   Probiotic Product (PROBIOTIC ADVANCED PO), Take by mouth., Disp: , Rfl:    zolpidem (AMBIEN) 10 MG tablet, Take 10 mg by mouth at bedtime., Disp: , Rfl:   Social History   Tobacco Use  Smoking Status Never  Smokeless Tobacco Never    Allergies  Allergen Reactions   Ingrezza [Valbenazine Tosylate] Other (See Comments)    Night sweats, worsened tardive dyskinesia, nightmares   Lidocaine Swelling    REACTION: swelling ALL CAINE DRUGS EXCEPT: marcaine & sensercaine Other reaction(s): Edema   Xylocaine [Lidocaine Hcl] Swelling    ALLERGIC TO ALL CAINES EXCEPT SENSORCAINE , AND MARCAINE   Amantadine    Austedo Xr [Deutetrabenazine]     Caused Muscle aches and tremors   Codeine Nausea And Vomiting    REACTION: vomits blood   Lithium Swelling    Tegretol [Carbamazepine] Other (See Comments)    REACTION: skin crawls   Objective:  There were no vitals filed for this visit. There is no height or weight on file to calculate BMI. Constitutional Well developed. Well nourished.  Vascular Dorsalis pedis pulses palpable bilaterally. Posterior tibial pulses palpable bilaterally. Capillary refill normal to all digits.  No cyanosis or clubbing noted. Pedal hair growth normal.  Neurologic Normal speech. Oriented to person, place, and time. Epicritic sensation to light touch grossly present bilaterally.  Dermatologic Nails well groomed and normal in appearance. No open wounds. No skin lesions.  Orthopedic: Pain on palpation left lateral foot pain with resisted dorsiflexion eversion of the foot no pain with plantarflexion inversion of the foot.  Pain along the course of the peroneal tendon no pain at the insertion mild pain at the ATFL ligament.   Radiographs: None Assessment:   1. Peroneal tendinitis of left lower extremity    Plan:  Patient was evaluated and treated and all questions answered.  Left peroneal tendinitis -All questions and concerns were discussed with the patient extensive detail given the amount of pain that she is having she will benefit from cam boot immobilization I encouraged her to wear the cam boot for next 4 weeks to help immobilize the peroneal tendon.  She states understand will do so immediately.  If there are still some residual pain we will discuss steroid injection versus MRI.  She states understanding  No follow-ups on file.

## 2023-09-30 DIAGNOSIS — Z7901 Long term (current) use of anticoagulants: Secondary | ICD-10-CM | POA: Insufficient documentation

## 2023-09-30 DIAGNOSIS — Z9889 Other specified postprocedural states: Secondary | ICD-10-CM | POA: Diagnosis not present

## 2023-09-30 DIAGNOSIS — I480 Paroxysmal atrial fibrillation: Secondary | ICD-10-CM | POA: Diagnosis not present

## 2023-09-30 DIAGNOSIS — Z8679 Personal history of other diseases of the circulatory system: Secondary | ICD-10-CM | POA: Insufficient documentation

## 2023-09-30 DIAGNOSIS — I4892 Unspecified atrial flutter: Secondary | ICD-10-CM | POA: Diagnosis not present

## 2023-09-30 LAB — SURGICAL PATHOLOGY

## 2023-10-02 ENCOUNTER — Ambulatory Visit (INDEPENDENT_AMBULATORY_CARE_PROVIDER_SITE_OTHER): Payer: Medicare HMO | Admitting: Psychology

## 2023-10-02 DIAGNOSIS — F3181 Bipolar II disorder: Secondary | ICD-10-CM

## 2023-10-02 NOTE — Progress Notes (Unsigned)
                Diona Peregoy G Denario Bagot, LCSW 

## 2023-10-17 DIAGNOSIS — R079 Chest pain, unspecified: Secondary | ICD-10-CM | POA: Diagnosis not present

## 2023-10-17 DIAGNOSIS — R001 Bradycardia, unspecified: Secondary | ICD-10-CM | POA: Diagnosis not present

## 2023-10-17 DIAGNOSIS — Z9889 Other specified postprocedural states: Secondary | ICD-10-CM | POA: Diagnosis not present

## 2023-10-17 DIAGNOSIS — Z7901 Long term (current) use of anticoagulants: Secondary | ICD-10-CM | POA: Diagnosis not present

## 2023-10-17 DIAGNOSIS — I480 Paroxysmal atrial fibrillation: Secondary | ICD-10-CM | POA: Diagnosis not present

## 2023-10-17 DIAGNOSIS — I10 Essential (primary) hypertension: Secondary | ICD-10-CM | POA: Diagnosis not present

## 2023-10-17 DIAGNOSIS — R0683 Snoring: Secondary | ICD-10-CM | POA: Diagnosis not present

## 2023-10-17 DIAGNOSIS — I4892 Unspecified atrial flutter: Secondary | ICD-10-CM | POA: Diagnosis not present

## 2023-10-22 ENCOUNTER — Ambulatory Visit
Admission: EM | Admit: 2023-10-22 | Discharge: 2023-10-22 | Disposition: A | Discharge status: Home / Self Care | Payer: Medicare HMO | Attending: Emergency Medicine | Admitting: Emergency Medicine

## 2023-10-22 ENCOUNTER — Inpatient Hospital Stay
Admission: EM | Admit: 2023-10-22 | Discharge: 2023-10-24 | DRG: 313 | Disposition: A | Discharge status: Home / Self Care | Payer: Medicare HMO | Attending: Osteopathic Medicine | Admitting: Osteopathic Medicine

## 2023-10-22 ENCOUNTER — Other Ambulatory Visit: Payer: Self-pay

## 2023-10-22 ENCOUNTER — Emergency Department: Payer: Medicare HMO

## 2023-10-22 ENCOUNTER — Encounter: Payer: Self-pay | Admitting: Podiatry

## 2023-10-22 ENCOUNTER — Encounter: Payer: Self-pay | Admitting: Emergency Medicine

## 2023-10-22 ENCOUNTER — Ambulatory Visit: Payer: Medicare HMO | Admitting: Podiatry

## 2023-10-22 VITALS — Ht 61.5 in | Wt 224.2 lb

## 2023-10-22 DIAGNOSIS — R0602 Shortness of breath: Secondary | ICD-10-CM | POA: Diagnosis not present

## 2023-10-22 DIAGNOSIS — Q666 Other congenital valgus deformities of feet: Secondary | ICD-10-CM | POA: Diagnosis not present

## 2023-10-22 DIAGNOSIS — Z85828 Personal history of other malignant neoplasm of skin: Secondary | ICD-10-CM | POA: Diagnosis not present

## 2023-10-22 DIAGNOSIS — Z8249 Family history of ischemic heart disease and other diseases of the circulatory system: Secondary | ICD-10-CM

## 2023-10-22 DIAGNOSIS — I483 Typical atrial flutter: Secondary | ICD-10-CM | POA: Diagnosis not present

## 2023-10-22 DIAGNOSIS — R079 Chest pain, unspecified: Principal | ICD-10-CM | POA: Diagnosis present

## 2023-10-22 DIAGNOSIS — Z83438 Family history of other disorder of lipoprotein metabolism and other lipidemia: Secondary | ICD-10-CM

## 2023-10-22 DIAGNOSIS — E785 Hyperlipidemia, unspecified: Secondary | ICD-10-CM | POA: Diagnosis not present

## 2023-10-22 DIAGNOSIS — Z7982 Long term (current) use of aspirin: Secondary | ICD-10-CM

## 2023-10-22 DIAGNOSIS — Z1152 Encounter for screening for COVID-19: Secondary | ICD-10-CM | POA: Diagnosis not present

## 2023-10-22 DIAGNOSIS — I480 Paroxysmal atrial fibrillation: Secondary | ICD-10-CM | POA: Diagnosis not present

## 2023-10-22 DIAGNOSIS — R509 Fever, unspecified: Secondary | ICD-10-CM | POA: Diagnosis present

## 2023-10-22 DIAGNOSIS — I2089 Other forms of angina pectoris: Secondary | ICD-10-CM | POA: Diagnosis not present

## 2023-10-22 DIAGNOSIS — R9431 Abnormal electrocardiogram [ECG] [EKG]: Secondary | ICD-10-CM

## 2023-10-22 DIAGNOSIS — Z803 Family history of malignant neoplasm of breast: Secondary | ICD-10-CM

## 2023-10-22 DIAGNOSIS — Z6841 Body Mass Index (BMI) 40.0 and over, adult: Secondary | ICD-10-CM

## 2023-10-22 DIAGNOSIS — G4700 Insomnia, unspecified: Secondary | ICD-10-CM | POA: Diagnosis present

## 2023-10-22 DIAGNOSIS — T50905A Adverse effect of unspecified drugs, medicaments and biological substances, initial encounter: Secondary | ICD-10-CM | POA: Diagnosis not present

## 2023-10-22 DIAGNOSIS — E039 Hypothyroidism, unspecified: Secondary | ICD-10-CM | POA: Diagnosis present

## 2023-10-22 DIAGNOSIS — N898 Other specified noninflammatory disorders of vagina: Secondary | ICD-10-CM | POA: Diagnosis not present

## 2023-10-22 DIAGNOSIS — I214 Non-ST elevation (NSTEMI) myocardial infarction: Secondary | ICD-10-CM

## 2023-10-22 DIAGNOSIS — K219 Gastro-esophageal reflux disease without esophagitis: Secondary | ICD-10-CM | POA: Diagnosis present

## 2023-10-22 DIAGNOSIS — Z79899 Other long term (current) drug therapy: Secondary | ICD-10-CM

## 2023-10-22 DIAGNOSIS — Z86006 Personal history of melanoma in-situ: Secondary | ICD-10-CM | POA: Diagnosis not present

## 2023-10-22 DIAGNOSIS — Z888 Allergy status to other drugs, medicaments and biological substances status: Secondary | ICD-10-CM | POA: Diagnosis not present

## 2023-10-22 DIAGNOSIS — Z833 Family history of diabetes mellitus: Secondary | ICD-10-CM

## 2023-10-22 DIAGNOSIS — I441 Atrioventricular block, second degree: Secondary | ICD-10-CM | POA: Diagnosis present

## 2023-10-22 DIAGNOSIS — R0789 Other chest pain: Principal | ICD-10-CM | POA: Diagnosis present

## 2023-10-22 DIAGNOSIS — F319 Bipolar disorder, unspecified: Secondary | ICD-10-CM | POA: Diagnosis not present

## 2023-10-22 DIAGNOSIS — E669 Obesity, unspecified: Secondary | ICD-10-CM | POA: Diagnosis present

## 2023-10-22 DIAGNOSIS — R7989 Other specified abnormal findings of blood chemistry: Secondary | ICD-10-CM | POA: Diagnosis present

## 2023-10-22 DIAGNOSIS — I10 Essential (primary) hypertension: Secondary | ICD-10-CM | POA: Diagnosis present

## 2023-10-22 DIAGNOSIS — Z7901 Long term (current) use of anticoagulants: Secondary | ICD-10-CM | POA: Diagnosis not present

## 2023-10-22 DIAGNOSIS — M7672 Peroneal tendinitis, left leg: Secondary | ICD-10-CM

## 2023-10-22 DIAGNOSIS — Z79890 Hormone replacement therapy (postmenopausal): Secondary | ICD-10-CM | POA: Diagnosis not present

## 2023-10-22 DIAGNOSIS — Z884 Allergy status to anesthetic agent status: Secondary | ICD-10-CM

## 2023-10-22 DIAGNOSIS — I517 Cardiomegaly: Secondary | ICD-10-CM | POA: Diagnosis not present

## 2023-10-22 DIAGNOSIS — Z823 Family history of stroke: Secondary | ICD-10-CM

## 2023-10-22 HISTORY — DX: Non-ST elevation (NSTEMI) myocardial infarction: I21.4

## 2023-10-22 LAB — URINALYSIS, ROUTINE W REFLEX MICROSCOPIC
Bilirubin Urine: NEGATIVE
Glucose, UA: NEGATIVE mg/dL
Hgb urine dipstick: NEGATIVE
Ketones, ur: NEGATIVE mg/dL
Leukocytes,Ua: NEGATIVE
Nitrite: NEGATIVE
Protein, ur: NEGATIVE mg/dL
Specific Gravity, Urine: 1.006 (ref 1.005–1.030)
pH: 6 (ref 5.0–8.0)

## 2023-10-22 LAB — CBC
HCT: 31.2 % — ABNORMAL LOW (ref 36.0–46.0)
Hemoglobin: 10.3 g/dL — ABNORMAL LOW (ref 12.0–15.0)
MCH: 30.3 pg (ref 26.0–34.0)
MCHC: 33 g/dL (ref 30.0–36.0)
MCV: 91.8 fL (ref 80.0–100.0)
Platelets: 248 10*3/uL (ref 150–400)
RBC: 3.4 MIL/uL — ABNORMAL LOW (ref 3.87–5.11)
RDW: 13.2 % (ref 11.5–15.5)
WBC: 10.3 10*3/uL (ref 4.0–10.5)
nRBC: 0 % (ref 0.0–0.2)

## 2023-10-22 LAB — BASIC METABOLIC PANEL
Anion gap: 9 (ref 5–15)
BUN: 10 mg/dL (ref 8–23)
CO2: 24 mmol/L (ref 22–32)
Calcium: 8.8 mg/dL — ABNORMAL LOW (ref 8.9–10.3)
Chloride: 101 mmol/L (ref 98–111)
Creatinine, Ser: 1.05 mg/dL — ABNORMAL HIGH (ref 0.44–1.00)
GFR, Estimated: 59 mL/min — ABNORMAL LOW (ref >60)
Glucose, Bld: 111 mg/dL — ABNORMAL HIGH (ref 70–99)
Potassium: 4 mmol/L (ref 3.5–5.1)
Sodium: 134 mmol/L — ABNORMAL LOW (ref 135–145)

## 2023-10-22 LAB — RESP PANEL BY RT-PCR (RSV, FLU A&B, COVID)  RVPGX2
Influenza A by PCR: NEGATIVE
Influenza B by PCR: NEGATIVE
Resp Syncytial Virus by PCR: NEGATIVE
SARS Coronavirus 2 by RT PCR: NEGATIVE

## 2023-10-22 LAB — APTT: aPTT: 77 s — ABNORMAL HIGH (ref 24–36)

## 2023-10-22 LAB — TSH: TSH: 1.29 u[IU]/mL (ref 0.350–4.500)

## 2023-10-22 LAB — TROPONIN I (HIGH SENSITIVITY)
Troponin I (High Sensitivity): 116 ng/L (ref <18)
Troponin I (High Sensitivity): 105 ng/L (ref <18)
Troponin I (High Sensitivity): 107 ng/L (ref <18)

## 2023-10-22 LAB — PROTIME-INR
INR: 1.6 — ABNORMAL HIGH (ref 0.8–1.2)
Prothrombin Time: 19.1 s — ABNORMAL HIGH (ref 11.4–15.2)

## 2023-10-22 LAB — HEPARIN LEVEL (UNFRACTIONATED): Heparin Unfractionated: >1.1 [IU]/mL — ABNORMAL HIGH (ref 0.30–0.70)

## 2023-10-22 MED ORDER — ASPIRIN 81 MG PO TBEC
81.0000 mg | DELAYED_RELEASE_TABLET | Freq: Every day | ORAL | Status: DC
  Administered 2023-10-22 – 2023-10-24 (×2): 81 mg via ORAL
  Filled 2023-10-22 (×3): qty 1

## 2023-10-22 MED ORDER — ACETAMINOPHEN 325 MG PO TABS
650.0000 mg | ORAL_TABLET | ORAL | Status: DC | PRN
  Administered 2023-10-22 – 2023-10-24 (×4): 650 mg via ORAL
  Filled 2023-10-22 (×5): qty 2

## 2023-10-22 MED ORDER — CLONAZEPAM 1 MG PO TABS
1.0000 mg | ORAL_TABLET | Freq: Once | ORAL | Status: AC
  Administered 2023-10-22: 1 mg via ORAL
  Filled 2023-10-22: qty 1

## 2023-10-22 MED ORDER — LAMOTRIGINE 100 MG PO TABS
150.0000 mg | ORAL_TABLET | Freq: Every day | ORAL | Status: DC
  Administered 2023-10-22 – 2023-10-23 (×2): 150 mg via ORAL
  Filled 2023-10-22 (×2): qty 2

## 2023-10-22 MED ORDER — HEPARIN (PORCINE) 25000 UT/250ML-% IV SOLN
880.0000 [IU]/h | INTRAVENOUS | Status: DC
  Administered 2023-10-22 – 2023-10-23 (×2): 950 [IU]/h via INTRAVENOUS
  Filled 2023-10-22 (×2): qty 250

## 2023-10-22 MED ORDER — LEVOTHYROXINE SODIUM 50 MCG PO TABS
50.0000 ug | ORAL_TABLET | Freq: Every day | ORAL | Status: DC
  Administered 2023-10-23 – 2023-10-24 (×2): 50 ug via ORAL
  Filled 2023-10-22 (×2): qty 1

## 2023-10-22 MED ORDER — ASPIRIN 81 MG PO CHEW
324.0000 mg | CHEWABLE_TABLET | Freq: Once | ORAL | Status: AC
  Administered 2023-10-22: 324 mg via ORAL
  Filled 2023-10-22: qty 4

## 2023-10-22 MED ORDER — ONDANSETRON HCL 4 MG/2ML IJ SOLN
4.0000 mg | Freq: Four times a day (QID) | INTRAMUSCULAR | Status: DC | PRN
  Administered 2023-10-23: 4 mg via INTRAVENOUS
  Filled 2023-10-22: qty 2

## 2023-10-22 MED ORDER — HEPARIN BOLUS VIA INFUSION
2000.0000 [IU] | Freq: Once | INTRAVENOUS | Status: AC
Start: 2023-10-22 — End: 2023-10-22
  Administered 2023-10-22: 2000 [IU] via INTRAVENOUS
  Filled 2023-10-22: qty 2000

## 2023-10-22 NOTE — ED Provider Notes (Signed)
[facility]    CSN: [id number] Arrival date & time: 10/22/23  1020      History   Chief Complaint Chief Complaint  Patient presents with   Fever   Generalized Body Aches    HPI [name] [name] is a 67 y.o. female.  Patient presents with fever, chills, body aches, cough, shortness of breath x 3 days but worse since yesterday.  She is short of breath with walking.  She has also had some non-radiating mid-sternal chest pain last night but none today.  Tmax 101.  No focal weakness or other symptoms.  Patient states her watch notified her on 10/18/2023 that her heart rate was 39 for 15 minutes.  She had a cardiac ablation in July.  Her medical history includes atrial flutter and atrial fibrillation.  She is followed by [facility] cardiology.  The history is provided by the patient and medical records.    Past Medical History:  Diagnosis Date   Actinic keratosis 03/29/2021   R central upper lip   Allergic rhinitis    Allergy    Anxiety    Arthritis    Atypical mole 12/26/2021   spinal upper back, exc 01/15/22   Basal cell carcinoma 10/16/2010   R lower pretibia    Bipolar 1 disorder (HCC)    Depression    Fissure, anal    GERD (gastroesophageal reflux disease)    History of basal cell carcinoma excision    ON FACE   History of squamous cell carcinoma excision    ON RIGHT LEG   Hyperlipidemia    Hypothyroidism    IBS (irritable bowel syndrome)    Liver cyst    Malignant melanoma in situ (HCC) 11/20/2022   Right labia minora. Excision 12/25/2022 at anterior vulva, left of clitoral hood, margins involved. Surgery scheduled for 02/12/2023.   Mallet finger of left hand    small finger   Pre-diabetes     Patient Active Problem List   Diagnosis Date Noted   Chest pain 10/22/2023   Pain of left hip 09/11/2023   History of anal fissures 09/09/2023   Acute left ankle pain 08/09/2023   Osteopenia 07/30/2023   Headache 07/05/2023   Paroxysmal atrial fibrillation (HCC)  06/27/2023   Bradycardia 05/30/2023   Melanoma in situ (HCC) 12/25/2022   ASCUS of cervix with negative high risk HPV 06/25/2022   Osteoarthritis of knee 05/25/2022   Hemorrhoid 04/25/2022   Estrogen deficiency 01/02/2022   Bipolar disorder, in partial remission, most recent episode mixed (HCC) 02/25/2020   Elevated glucose level 03/03/2018   Atrophic vaginitis 07/08/2017   Cervical stenosis (uterine cervix) 01/03/2017   Hearing loss 10/25/2015   Routine general medical examination at a health care facility 10/17/2015   Rash 02/22/2015   Decreased GFR 10/22/2014   Colon cancer screening 10/16/2013   Enthesopathy of ankle and tarsus 12/01/2009   Enthesopathy of ankle and tarsus 12/01/2009   Asymptomatic postmenopausal status 09/30/2009   Arthropathy of pelvic region and thigh 03/10/2009   DEGENERATIVE DISC DISEASE, LUMBAR SPINE 03/10/2009   DDD (degenerative disc disease), lumbosacral 03/10/2009   ONYCHOMYCOSIS 09/17/2008   Hypothyroidism 08/05/2007   Morbid obesity (HCC) 08/05/2007   Bipolar disorder (HCC) 08/05/2007   Allergic rhinitis 08/05/2007   IBS 08/05/2007   GERD (gastroesophageal reflux disease) 08/05/2007   HYPERCHOLESTEROLEMIA, PURE 07/07/2007    Past Surgical History:  Procedure Laterality Date   ABLATION     CARDIAC CATHETERIZATION  04/10/1999   per pt normal  CARDIOVASCULAR STRESS TEST  02-23-2011  dr Iantha Fallen fath [facility] clinic)   normal nuclear study/  no ischemia/  ef 64%   CATARACT EXTRACTION W/ INTRAOCULAR LENS  IMPLANT, BILATERAL  12/10/2001   Bil   CESAREAN SECTION  12/10/1982   CLOSED REDUCTION FINGER WITH PERCUTANEOUS PINNING Left 04/22/2014   Procedure: LEFT SMALL FINGER CLOSED REDUCTION PINNING;  Surgeon: Sharma Covert, MD;  Location: [facility];  Service: Orthopedics;  Laterality: Left;   COLONOSCOPY  11/09/2002   DILATION AND CURETTAGE OF UTERUS  1977   LAPAROSCOPIC CHOLECYSTECTOMY  03/18/2000   RECONSTRUCTION OF EYELID  Bilateral 08/2022   TRANSTHORACIC ECHOCARDIOGRAM  02/23/2011   normal lvf/  ef 58%/  mild rve/   mild lae/  mild  mr  & tr/  mild pulmonary htn   VULVECTOMY N/A 12/25/2022   Procedure: PARTIAL  VULVECTOMY;  Surgeon: [name], MD;  Location: [facility];  Service: Gynecology;  Laterality: N/A;   VULVECTOMY N/A 02/12/2023   Procedure: WIDE LOCAL EXCISION OF VULVA AND REMOVAL OF CLITORIS;  Surgeon: [name], MD;  Location: [facility];  Service: Gynecology;  Laterality: N/A;   WISDOM TOOTH EXTRACTION      OB History     Gravida  1   Para  1   Term  1   Preterm      AB      Living  1      SAB      IAB      Ectopic      Multiple      Live Births  1            Home Medications    Prior to Admission medications   Medication Sig Start Date End Date Taking? Authorizing Provider  apixaban (ELIQUIS) 5 MG TABS tablet Take 5 mg by mouth 2 (two) times daily.    [provider]  ARIPiprazole (ABILIFY) 10 MG tablet Take 10 mg by mouth daily. 07/11/23   [provider]  BIOTIN PO Take 1 tablet by mouth daily.    [provider]  buPROPion (WELLBUTRIN XL) 300 MG 24 hr tablet Take 300 mg by mouth daily. 04/08/23   [provider]  cetirizine (ZYRTEC ALLERGY) 10 MG tablet Take 10 mg by mouth daily as needed for allergies.    [provider]  Cholecalciferol (VITAMIN D) 2000 UNITS CAPS Take 1 capsule by mouth daily.    [provider]  clonazePAM (KLONOPIN) 1 MG tablet Take 1 mg by mouth at bedtime.    [provider]  cyanocobalamin (VITAMIN B12) 1000 MCG tablet Take by mouth.    [provider]  famotidine (PEPCID) 20 MG tablet Take 1 tablet (20 mg total) by mouth 2 (two) times daily. 07/09/23   Tower, Audrie Gallus, MD  fluticasone (FLONASE) 50 MCG/ACT nasal spray Place into the nose.    [provider]  hydrocortisone (ANUSOL-HC) 2.5 % rectal cream Apply 1  application. topically 2 (two) times daily. To affected area twice daily (around anus/ hemorrhoid) 04/25/22   Tower, Audrie Gallus, MD  lamoTRIgine (LAMICTAL) 150 MG tablet Take 1 tablet (150 mg total) by mouth daily. 07/02/23   Joaquim Nam, MD  levothyroxine (SYNTHROID) 50 MCG tablet TAKE 1 TABLET EVERY DAY BEFORE BREAKFAST 06/20/23   Tower, Audrie Gallus, MD  lisinopril (ZESTRIL) 10 MG tablet Take 10 mg by mouth daily.    [provider]  lovastatin (MEVACOR)  40 MG tablet TAKE 1 TABLET EVERY DAY 06/20/23   Tower, Audrie Gallus, MD  metroNIDAZOLE (METROGEL) 1 % gel Apply topically daily. To nose, cheeks for rosacea 10/29/22   Willeen Niece, MD  nystatin (MYCOSTATIN/NYSTOP) powder Apply 1 Application topically 3 (three) times daily. Under abdomen skin fold 12/17/22   [name] D, NP  Probiotic Product (PROBIOTIC ADVANCED PO) Take by mouth.    [provider]  zolpidem (AMBIEN) 10 MG tablet Take 10 mg by mouth at bedtime.    [provider]    Family History Family History  Problem Relation Age of Onset   Stroke Mother    Heart disease Mother    Hypertension Father    Diabetes Father    Hyperlipidemia Father    Breast cancer Sister    Breast cancer Sister    Diabetes Brother    Colon cancer Neg Hx    Ovarian cancer Neg Hx    Endometrial cancer Neg Hx    Pancreatic cancer Neg Hx    Prostate cancer Neg Hx     Social History Social History   Tobacco Use   Smoking status: Never   Smokeless tobacco: Never  Vaping Use   Vaping status: Never Used  Substance Use Topics   Alcohol use: Yes    Comment: Only on cruises or rarely   Drug use: No     Allergies   Ingrezza [valbenazine tosylate], Lidocaine, Xylocaine [lidocaine hcl], Amantadine, Austedo xr [deutetrabenazine], Codeine, Lithium, and Tegretol [carbamazepine]   Review of Systems Review of Systems  Constitutional:  Positive for chills and fever.  HENT:  Negative for ear pain and sore throat.   Respiratory:   Positive for cough and shortness of breath.   Cardiovascular:  Positive for chest pain. Negative for palpitations.  Neurological:  Negative for weakness and numbness.     Physical Exam Triage Vital Signs ED Triage Vitals  Encounter Vitals Group     BP --      Systolic BP Percentile --      Diastolic BP Percentile --      Pulse Rate 10/22/23 1102 70     Resp 10/22/23 1102 18     Temp 10/22/23 1102 99.3 F (37.4 C)     Temp src --      SpO2 10/22/23 1102 95 %     Weight --      Height --      Head Circumference --      Peak Flow --      Pain Score 10/22/23 1105 4     Pain Loc --      Pain Education --      Exclude from Growth Chart --    No data found.  Updated Vital Signs BP 116/80   Pulse 70   Temp 99.3 F (37.4 C)   Resp 18   SpO2 95%   Visual Acuity Right Eye Distance:   Left Eye Distance:   Bilateral Distance:    Right Eye Near:   Left Eye Near:    Bilateral Near:     Physical Exam Constitutional:      General: She is not in acute distress. HENT:     Mouth/Throat:     Mouth: Mucous membranes are moist.  Cardiovascular:     Rate and Rhythm: Bradycardia present. Rhythm irregular.     Heart sounds: Normal heart sounds.  Pulmonary:     Effort: Pulmonary effort is normal. No respiratory distress.  Breath sounds: Normal breath sounds.  Neurological:     General: No focal deficit present.     Mental Status: She is alert and oriented to person, place, and time.      UC Treatments / Results  Labs (all labs ordered are listed, but only abnormal results are displayed) Labs Reviewed - No data to display  EKG   Radiology No results found.  Procedures Procedures (including critical care time)  Medications Ordered in UC Medications - No data to display  Initial Impression / Assessment and Plan / UC Course  I have reviewed the triage vital signs and the nursing notes.  Pertinent labs & imaging results that were available during my care of  the patient were reviewed by me and considered in my medical decision making (see chart for details).   Shortness of breath, abnormal EKG, episode of chest pain last night but none today.   EKG is abnormal; it shows rate 59, PR interval is progressively wider and appears to be possible type II heart block, no ST elevation, compared to previous from July and is changed.  Sending patient to the ED for evaluation.  She is agreeable to this but she declines EMS.  Her husband drove her here and she prefers that her husband drive her to the ED.  I discussed this with her husband and he is agreeable to this.  I instructed him to take her to the closest ED which is [place].  Final Clinical Impressions(s) / UC Diagnoses   Final diagnoses:  Shortness of breath  Abnormal EKG  Other chest pain     Discharge Instructions      Go to the emergency department for your abnormal EKG, shortness of breath, and the chest pain that you had last night.     ED Prescriptions   None    PDMP not reviewed this encounter.   Mickie Bail, NP 10/22/23 [phone]

## 2023-10-22 NOTE — Assessment & Plan Note (Signed)
Troponin 100s with active chest pain Noted?  Mobitz type I block on EKG Heart score 6 Started on heparin drip in the ER Risk stratification labs Follow-up cardiology recommendations

## 2023-10-22 NOTE — ED Provider Notes (Signed)
[facility] Provider Note    Event Date/Time   First MD Initiated Contact with Patient 10/22/23 1243     (approximate)   History   Chest Pain   HPI  [name] [name] is a 67 y.o. female history of A-fib status post ablation on Eliquis presents to the ER for evaluation of 24 hours of chest pain describes as achy pressure in chest.  Felt like she was having some temperature last night denies any cough has been having some shortness of breath particular with exertion.  Has also been having some irregular heartbeats.  Denies any tachycardia.  Not on any rate controlling medications went to urgent care for evaluation today was noted to be in Mobitz type I AV block and sent to the ER for further evaluation.  On arrival she does describe persistent dull pressure very mild in nature right now.  No fevers or chills.  No productive cough.  No sick contacts.     Physical Exam   Triage Vital Signs: ED Triage Vitals  Encounter Vitals Group     BP 10/22/23 1151 (!) 160/85     Systolic BP Percentile --      Diastolic BP Percentile --      Pulse Rate 10/22/23 1151 83     Resp 10/22/23 1151 18     Temp 10/22/23 1151 98.2 F (36.8 C)     Temp Source 10/22/23 1151 Oral     SpO2 10/22/23 1151 100 %     Weight 10/22/23 1149 218 lb (98.9 kg)     Height 10/22/23 1149 5' 1.5" (1.562 m)     Head Circumference --      Peak Flow --      Pain Score 10/22/23 1149 4     Pain Loc --      Pain Education --      Exclude from Growth Chart --     Most recent vital signs: Vitals:   10/22/23 1151  BP: (!) 160/85  Pulse: 83  Resp: 18  Temp: 98.2 F (36.8 C)  SpO2: 100%     Constitutional: Alert  Eyes: Conjunctivae are normal.  Head: Atraumatic. Nose: No congestion/rhinnorhea. Mouth/Throat: Mucous membranes are moist.   Neck: Painless ROM.  Cardiovascular:   Good peripheral circulation. Respiratory: Normal respiratory effort.  No retractions.  Gastrointestinal: Soft  and nontender.  Musculoskeletal:  no deformity Neurologic:  MAE spontaneously. No gross focal neurologic deficits are appreciated.  Skin:  Skin is warm, dry and intact. No rash noted. Psychiatric: Mood and affect are normal. Speech and behavior are normal.    ED Results / Procedures / Treatments   Labs (all labs ordered are listed, but only abnormal results are displayed) Labs Reviewed  BASIC METABOLIC PANEL - Abnormal; Notable for the following components:      Result Value   Sodium 134 (*)    Glucose, Bld 111 (*)    Creatinine, Ser 1.05 (*)    Calcium 8.8 (*)    GFR, Estimated 59 (*)    All other components within normal limits  CBC - Abnormal; Notable for the following components:   RBC 3.40 (*)    Hemoglobin 10.3 (*)    HCT 31.2 (*)    All other components within normal limits  PROTIME-INR - Abnormal; Notable for the following components:   Prothrombin Time 19.1 (*)    INR 1.6 (*)    All other components within normal limits  URINALYSIS, ROUTINE  W REFLEX MICROSCOPIC - Abnormal; Notable for the following components:   Color, Urine YELLOW (*)    APPearance CLEAR (*)    All other components within normal limits  TROPONIN I (HIGH SENSITIVITY) - Abnormal; Notable for the following components:   Troponin I (High Sensitivity) 116 (*)    All other components within normal limits  TROPONIN I (HIGH SENSITIVITY) - Abnormal; Notable for the following components:   Troponin I (High Sensitivity) 105 (*)    All other components within normal limits  RESP PANEL BY RT-PCR (RSV, FLU A&B, COVID)  RVPGX2  TSH  HIV ANTIBODY (ROUTINE TESTING W REFLEX)  TROPONIN I (HIGH SENSITIVITY)     EKG  ED ECG REPORT I, Willy Eddy, the attending physician, personally viewed and interpreted this ECG.   Date: 10/22/2023  EKG Time: 11:20  Rate: 60  Rhythm: mobitz type 1  Axis: normal  Intervals: normal  ST&T Change: no stemi, nonspecific st     RADIOLOGY Please see ED Course for my  review and interpretation.  I personally reviewed all radiographic images ordered to evaluate for the above acute complaints and reviewed radiology reports and findings.  These findings were personally discussed with the patient.  Please see medical record for radiology report.    PROCEDURES:  Critical Care performed: No  Procedures   MEDICATIONS ORDERED IN ED: Medications  acetaminophen (TYLENOL) tablet 650 mg (has no administration in time range)  ondansetron (ZOFRAN) injection 4 mg (has no administration in time range)  aspirin chewable tablet 324 mg (324 mg Oral Given 10/22/23 1346)     IMPRESSION / MDM / ASSESSMENT AND PLAN / ED COURSE  I reviewed the triage vital signs and the nursing notes.                              Differential diagnosis includes, but is not limited to, ACS, pericarditis, esophagitis, boerhaaves, pe, dissection, pna, bronchitis, costochondritis  Patient presenting to the ER for evaluation of symptoms as described above.  Based on symptoms, risk factors and considered above differential, this presenting complaint could reflect a potentially life-threatening illness therefore the patient will be placed on continuous pulse oximetry and telemetry for monitoring.  Laboratory evaluation will be sent to evaluate for the above complaints.  Patient's COVID and viral panel was negative.  Chest x-ray on my review and interpretation without evidence of pneumonia or infiltrate.  No white count no fever here no hypoxia.  Have a lower suspicion for infectious process but given her presentation and symptoms of chest pain at rest concern for ACS particularly with troponin elevation.  Does not have any history of CHF or recent A-fib with RVR to explain demand ischemia.  No recent cardiac cath.  She is on Eliquis already.  Will give aspirin.  Will consult hospitalist for admission for chest pain work up.       FINAL CLINICAL IMPRESSION(S) / ED DIAGNOSES   Final diagnoses:   Chest pain, unspecified type     Rx / DC Orders   ED Discharge Orders     None        Note:  This document was prepared using Dragon voice recognition software and may include unintentional dictation errors.    Willy Eddy, MD 10/22/23 [phone]

## 2023-10-22 NOTE — Assessment & Plan Note (Signed)
Cont lamictal

## 2023-10-22 NOTE — Assessment & Plan Note (Signed)
Baseline history of atrial fibrillation status post ablation July 2024 Rate controlled at present On Eliquis Transition to heparin drip in the setting of high risk chest pain Follow cardiology recommendations

## 2023-10-22 NOTE — Consult Note (Signed)
PHARMACY - ANTICOAGULATION CONSULT NOTE  Pharmacy Consult for Heparin Indication: NSTEMI  Allergies  Allergen Reactions   Ingrezza [Valbenazine Tosylate] Other (See Comments)    Night sweats, worsened tardive dyskinesia, nightmares   Lidocaine Swelling    REACTION: swelling ALL CAINE DRUGS EXCEPT: marcaine & sensercaine Other reaction(s): Edema   Xylocaine [Lidocaine Hcl] Swelling    ALLERGIC TO ALL CAINES EXCEPT SENSORCAINE , AND MARCAINE   Amantadine    Austedo Xr [Deutetrabenazine]     Caused Muscle aches and tremors   Codeine Nausea And Vomiting    REACTION: vomits blood   Lithium Swelling   Tegretol [Carbamazepine] Other (See Comments)    REACTION: skin crawls    Patient Measurements: Height: 5' 1.5" (156.2 cm) Weight: 98.9 kg (218 lb) IBW/kg (Calculated) : 48.95 Heparin Dosing Weight: 72.5 kg  Vital Signs: Temp: 98.2 F (36.8 C) (11/12 1151) Temp Source: Oral (11/12 1151) BP: 160/85 (11/12 1151) Pulse Rate: 83 (11/12 1151)  Labs: Recent Labs    10/22/23 1151 10/22/23 1400  HGB 10.3*  --   HCT 31.2*  --   PLT 248  --   LABPROT 19.1*  --   INR 1.6*  --   CREATININE 1.05*  --   TROPONINIHS 116* 105*    Estimated Creatinine Clearance: 58.2 mL/min (A) (by C-G formula based on SCr of 1.05 mg/dL (H)).   Medical History: Past Medical History:  Diagnosis Date   Actinic keratosis 03/29/2021   R central upper lip   Allergic rhinitis    Allergy    Anxiety    Arthritis    Atypical mole 12/26/2021   spinal upper back, exc 01/15/22   Basal cell carcinoma 10/16/2010   R lower pretibia    Bipolar 1 disorder (HCC)    Depression    Fissure, anal    GERD (gastroesophageal reflux disease)    History of basal cell carcinoma excision    ON FACE   History of squamous cell carcinoma excision    ON RIGHT LEG   Hyperlipidemia    Hypothyroidism    IBS (irritable bowel syndrome)    Liver cyst    Malignant melanoma in situ (HCC) 11/20/2022   Right labia  minora. Excision 12/25/2022 at anterior vulva, left of clitoral hood, margins involved. Surgery scheduled for 02/12/2023.   Mallet finger of left hand    small finger   NSTEMI (non-ST elevated myocardial infarction) (HCC) 10/22/2023   Pre-diabetes     Medications:  (Not in a hospital admission)  Scheduled:   lamoTRIgine  150 mg Oral Daily   [START ON 10/23/2023] levothyroxine  50 mcg Oral Q0600   Infusions:  PRN: acetaminophen, ondansetron (ZOFRAN) IV Anti-infectives (From admission, onward)    None       Assessment: Pharmacy consulted for heparin for concern for NSTEMI and evaluation of 24 hours of chest pain describes as achy pressure in chest. Pt is on apixaban for afib. Trop 105. Hgb slightly low from baseline and plt stable. Baseline heparin level will be ordered.   Goal of Therapy:  Heparin level 0.3-0.7 units/ml when aPTT and heparin level correlate.  aPTT 66-102 seconds Monitor platelets by anticoagulation protocol: Yes   Plan:  Will give heparin bolus of 2000 units. About half bolus due to apixaban.  Will start heparin infusion at 950 units/hr.  Check aPTT level in 6 hours.  CBC daily while on heparin.   Ronnald Ramp, PharmD, BCPS 10/22/2023,3:14 PM

## 2023-10-22 NOTE — H&P (Addendum)
History and Physical    Patient: [name] [name] XBJ:[id number] DOB: 11-Jan-1956 DOA: 10/22/2023 DOS: the patient was seen and examined on 10/22/2023 PCP: Judy Pimple, MD  Patient coming from: Home  Chief Complaint:  Chief Complaint  Patient presents with   Chest Pain   HPI: [name] [name] is a 67 y.o. female with medical history significant of obesity, GERD, hypothyroidism, hyperlipidemia, atrial fibrillation status post ablation July 2024 presenting with chest pain, NSTEMI.  Patient reports recurrent chest pain over the past week.  Chest pain moderate to severe in intensity. Nonspecific.   Chest pain has been predominately at rest.  Has had some episodes of diaphoresis.  Minimal nausea.  Has had some chills last night.  No shortness of breath.  No abdominal pain.  No focal hemiparesis or confusion.  Noted evaluation in the [facility] system for atrial fibrillation.  Had formal ablation done.  No active palpitations.  Has been compliant on Eliquis.  Patient does admit to recently starting on a diet supplement called fitspresso roughly 2 weeks ago.  Symptoms started around this time.  Does admit drinking roughly 2 cups of coffee daily.  Positive aspirin today with minimal to mild improvement in symptoms.  Patient denies any tobacco alcohol or illicit drug use. Presented to the ER afebrile, hemodynamically stable.  Satting well on room air.  White count 10.3, hemoglobin 10.3, platelets 248, troponin 116-105.  COVID flu and RSV negative.  Creatinine 1.05.  EKG with nonspecific ST and T wave changes and?  Mobitz type I block. Review of Systems: As mentioned in the history of present illness. All other systems reviewed and are negative. Past Medical History:  Diagnosis Date   Actinic keratosis 03/29/2021   R central upper lip   Allergic rhinitis    Allergy    Anxiety    Arthritis    Atypical mole 12/26/2021   spinal upper back, exc 01/15/22   Basal cell carcinoma 10/16/2010   R lower pretibia     Bipolar 1 disorder (HCC)    Depression    Fissure, anal    GERD (gastroesophageal reflux disease)    History of basal cell carcinoma excision    ON FACE   History of squamous cell carcinoma excision    ON RIGHT LEG   Hyperlipidemia    Hypothyroidism    IBS (irritable bowel syndrome)    Liver cyst    Malignant melanoma in situ (HCC) 11/20/2022   Right labia minora. Excision 12/25/2022 at anterior vulva, left of clitoral hood, margins involved. Surgery scheduled for 02/12/2023.   Mallet finger of left hand    small finger   NSTEMI (non-ST elevated myocardial infarction) (HCC) 10/22/2023   Pre-diabetes    Past Surgical History:  Procedure Laterality Date   ABLATION     CARDIAC CATHETERIZATION  04/10/1999   per pt normal   CARDIOVASCULAR STRESS TEST  02-23-2011  dr Iantha Fallen f[facility] clinic)   normal nuclear study/  no ischemia/  ef 64%   CATARACT EXTRACTION W/ INTRAOCULAR LENS  IMPLANT, BILATERAL  12/10/2001   Bil   CESAREAN SECTION  12/10/1982   CLOSED REDUCTION FINGER WITH PERCUTANEOUS PINNING Left 04/22/2014   Procedure: LEFT SMALL FINGER CLOSED REDUCTION PINNING;  Surgeon: Sharma Covert, MD;  Location: [facility];  Service: Orthopedics;  Laterality: Left;   COLONOSCOPY  11/09/2002   DILATION AND CURETTAGE OF UTERUS  1977   LAPAROSCOPIC CHOLECYSTECTOMY  03/18/2000   RECONSTRUCTION OF EYELID Bilateral 08/2022  TRANSTHORACIC ECHOCARDIOGRAM  02/23/2011   normal lvf/  ef 58%/  mild rve/   mild lae/  mild  mr  & tr/  mild pulmonary htn   VULVECTOMY N/A 12/25/2022   Procedure: PARTIAL  VULVECTOMY;  Surgeon: [name], MD;  Location: [facility];  Service: Gynecology;  Laterality: N/A;   VULVECTOMY N/A 02/12/2023   Procedure: WIDE LOCAL EXCISION OF VULVA AND REMOVAL OF CLITORIS;  Surgeon: [name], MD;  Location: [facility];  Service: Gynecology;  Laterality: N/A;   WISDOM TOOTH EXTRACTION     Social History:   reports that she has never smoked. She has never used smokeless tobacco. She reports current alcohol use. She reports that she does not use drugs.  Allergies  Allergen Reactions   Ingrezza [Valbenazine Tosylate] Other (See Comments)    Night sweats, worsened tardive dyskinesia, nightmares   Lidocaine Swelling    REACTION: swelling ALL CAINE DRUGS EXCEPT: marcaine & sensercaine Other reaction(s): Edema   Xylocaine [Lidocaine Hcl] Swelling    ALLERGIC TO ALL CAINES EXCEPT SENSORCAINE , AND MARCAINE   Amantadine    Austedo Xr [Deutetrabenazine]     Caused Muscle aches and tremors   Codeine Nausea And Vomiting    REACTION: vomits blood   Lithium Swelling   Tegretol [Carbamazepine] Other (See Comments)    REACTION: skin crawls    Family History  Problem Relation Age of Onset   Stroke Mother    Heart disease Mother    Hypertension Father    Diabetes Father    Hyperlipidemia Father    Breast cancer Sister    Breast cancer Sister    Diabetes Brother    Colon cancer Neg Hx    Ovarian cancer Neg Hx    Endometrial cancer Neg Hx    Pancreatic cancer Neg Hx    Prostate cancer Neg Hx     Prior to Admission medications   Medication Sig Start Date End Date Taking? Authorizing Provider  apixaban (ELIQUIS) 5 MG TABS tablet Take 5 mg by mouth 2 (two) times daily.   Yes [provider]  ARIPiprazole (ABILIFY) 10 MG tablet Take 10 mg by mouth daily. 07/11/23  Yes [provider]  BIOTIN PO Take 1 tablet by mouth daily.   Yes [provider]  buPROPion (WELLBUTRIN XL) 300 MG 24 hr tablet Take 300 mg by mouth daily. 04/08/23  Yes [provider]  cetirizine (ZYRTEC ALLERGY) 10 MG tablet Take 10 mg by mouth daily as needed for allergies.   Yes [provider]  Cholecalciferol (VITAMIN D) 2000 UNITS CAPS Take 1 capsule by mouth daily.   Yes [provider]  clonazePAM (KLONOPIN) 1 MG tablet Take 1 mg by mouth at bedtime.   Yes [provider]  famotidine (PEPCID) 20 MG tablet Take 1 tablet (20 mg total) by mouth 2 (two) times daily. 07/09/23  Yes Tower, Audrie Gallus, MD  fluticasone (FLONASE) 50 MCG/ACT nasal spray Place into the nose.   Yes [provider]  hydrocortisone (ANUSOL-HC) 2.5 % rectal cream Apply 1 application. topically 2 (two) times daily. To affected area twice daily (around anus/ hemorrhoid) 04/25/22  Yes Tower, Audrie Gallus, MD  lamoTRIgine (LAMICTAL) 150 MG tablet Take 1 tablet (150 mg total) by mouth daily. 07/02/23  Yes Joaquim Nam, MD  levothyroxine (SYNTHROID) 50 MCG tablet TAKE 1 TABLET EVERY DAY BEFORE BREAKFAST 06/20/23  Yes Tower, Audrie Gallus, MD  lisinopril (ZESTRIL) 10 MG tablet  Take 10 mg by mouth daily.   Yes [provider]  lovastatin (MEVACOR) 40 MG tablet TAKE 1 TABLET EVERY DAY 06/20/23  Yes Tower, Marne A, MD  nystatin (MYCOSTATIN/NYSTOP) powder Apply 1 Application topically 3 (three) times daily. Under abdomen skin fold 12/17/22  Yes Cross, Melissa D, NP  Probiotic Product (PROBIOTIC ADVANCED PO) Take by mouth.   Yes [provider]  tiZANidine (ZANAFLEX) 4 MG tablet Take 4 mg by mouth 2 (two) times daily. 09/22/23  Yes [provider]  zolpidem (AMBIEN) 10 MG tablet Take 10 mg by mouth at bedtime.   Yes [provider]  cyanocobalamin (VITAMIN B12) 1000 MCG tablet Take by mouth. Patient not taking: Reported on 10/22/2023    [provider]  metroNIDAZOLE (METROGEL) 1 % gel Apply topically daily. To nose, cheeks for rosacea Patient not taking: Reported on 10/22/2023 10/29/22   Willeen Niece, MD    Physical Exam: Vitals:   10/22/23 1149 10/22/23 1151  BP:  (!) 160/85  Pulse:  83  Resp:  18  Temp:  98.2 F (36.8 C)  TempSrc:  Oral  SpO2:  100%  Weight: 98.9 kg   Height: 5' 1.5" (1.562 m)    Physical Exam Constitutional:      Appearance: She is obese.  HENT:     Head: Normocephalic and atraumatic.     Nose: Nose normal.      Mouth/Throat:     Mouth: Mucous membranes are moist.  Eyes:     Pupils: Pupils are equal, round, and reactive to light.  Cardiovascular:     Rate and Rhythm: Normal rate and regular rhythm.  Pulmonary:     Effort: Pulmonary effort is normal.  Abdominal:     General: Bowel sounds are normal.  Musculoskeletal:        General: Normal range of motion.  Skin:    General: Skin is warm.  Neurological:     General: No focal deficit present.  Psychiatric:        Mood and Affect: Mood normal.     Data Reviewed:  There are no new results to review at this time.   Lab Results  Component Value Date   WBC 10.3 10/22/2023   HGB 10.3 (L) 10/22/2023   HCT 31.2 (L) 10/22/2023   MCV 91.8 10/22/2023   PLT 248 10/22/2023   Last metabolic panel Lab Results  Component Value Date   GLUCOSE 111 (H) 10/22/2023   NA 134 (L) 10/22/2023   K 4.0 10/22/2023   CL 101 10/22/2023   CO2 24 10/22/2023   BUN 10 10/22/2023   CREATININE 1.05 (H) 10/22/2023   GFRNONAA 59 (L) 10/22/2023   CALCIUM 8.8 (L) 10/22/2023   PHOS 4.2 11/15/2014   PROT 7.3 06/29/2023   ALBUMIN 4.1 06/29/2023   LABGLOB 2.4 10/19/2015   AGRATIO 1.8 10/19/2015   BILITOT 0.6 06/29/2023   ALKPHOS 69 06/29/2023   AST 25 06/29/2023   ALT 16 06/29/2023   ANIONGAP 9 10/22/2023    Assessment and Plan: * Chest pain Recurrent chest pain at rest concerning for possible unstable angina Troponin low 100s EKG grossly stable apart from?  Type I Mobitz block which may be new HEART score 6+  Status post full dose aspirin Will transition from Eliquis to heparin Prn ntg  Risk stratification labs Cardiology consulted Anticipate stress test vs. Cardiac catheterization  Follow-up recommendations   NSTEMI (non-ST elevated myocardial infarction) (HCC) Troponin 100s with active chest pain Noted?  Mobitz type I block on EKG Heart score 6 Started on heparin drip in the ER Risk stratification labs Follow-up cardiology  recommendations   Paroxysmal atrial fibrillation (HCC) Baseline history of atrial fibrillation status post ablation July 2024 Rate controlled at present On Eliquis Transition to heparin drip in the setting of high risk chest pain Follow cardiology recommendations   Hypothyroidism Continue Synthroid      Advance Care Planning:   Code Status: Full Code   Consults: Cardiology  Family Communication: Husband at the bedside   Severity of Illness: The appropriate patient status for this patient is INPATIENT. Inpatient status is judged to be reasonable and necessary in order to provide the required intensity of service to ensure the patient's safety. The patient's presenting symptoms, physical exam findings, and initial radiographic and laboratory data in the context of their chronic comorbidities is felt to place them at high risk for further clinical deterioration. Furthermore, it is not anticipated that the patient will be medically stable for discharge from the hospital within 2 midnights of admission.   * I certify that at the point of admission it is my clinical judgment that the patient will require inpatient hospital care spanning beyond 2 midnights from the point of admission due to high intensity of service, high risk for further deterioration and high frequency of surveillance required.*  Author: Floydene Flock, MD 10/22/2023 3:04 PM  For on call review [email or web address].

## 2023-10-22 NOTE — Progress Notes (Signed)
Subjective:  Patient ID: [name] [name], female    DOB: [birth year],  MRN: [id number]  Chief Complaint  Patient presents with   Foot Pain    F/U for left foot pain, pt states no pain at all hopes to get out of boot.    67 y.o. female presents with the above complaint.  Patient presents for follow-up of left peroneal tendinitis she states she is doing a lot better she still is some residual pain.  She would like to know if she can come out of the boot   Review of Systems: Negative except as noted in the HPI. Denies N/V/F/Ch.  Past Medical History:  Diagnosis Date   Actinic keratosis 03/29/2021   R central upper lip   Allergic rhinitis    Allergy    Anxiety    Arthritis    Atypical mole 12/26/2021   spinal upper back, exc 01/15/22   Basal cell carcinoma 10/16/2010   R lower pretibia    Bipolar 1 disorder (HCC)    Depression    Fissure, anal    GERD (gastroesophageal reflux disease)    History of basal cell carcinoma excision    ON FACE   History of squamous cell carcinoma excision    ON RIGHT LEG   Hyperlipidemia    Hypothyroidism    IBS (irritable bowel syndrome)    Liver cyst    Malignant melanoma in situ (HCC) 11/20/2022   Right labia minora. Excision 12/25/2022 at anterior vulva, left of clitoral hood, margins involved. Surgery scheduled for 02/12/2023.   Mallet finger of left hand    small finger   Pre-diabetes     Current Outpatient Medications:    apixaban (ELIQUIS) 5 MG TABS tablet, Take 5 mg by mouth 2 (two) times daily., Disp: , Rfl:    ARIPiprazole (ABILIFY) 10 MG tablet, Take 10 mg by mouth daily., Disp: , Rfl:    BIOTIN PO, Take 1 tablet by mouth daily., Disp: , Rfl:    buPROPion (WELLBUTRIN XL) 300 MG 24 hr tablet, Take 300 mg by mouth daily., Disp: , Rfl:    cetirizine (ZYRTEC ALLERGY) 10 MG tablet, Take 10 mg by mouth daily as needed for allergies., Disp: , Rfl:    Cholecalciferol (VITAMIN D) 2000 UNITS CAPS, Take 1 capsule by mouth daily., Disp: , Rfl:     clonazePAM (KLONOPIN) 1 MG tablet, Take 1 mg by mouth at bedtime., Disp: , Rfl:    cyanocobalamin (VITAMIN B12) 1000 MCG tablet, Take by mouth., Disp: , Rfl:    famotidine (PEPCID) 20 MG tablet, Take 1 tablet (20 mg total) by mouth 2 (two) times daily., Disp: 180 tablet, Rfl: 1   fluticasone (FLONASE) 50 MCG/ACT nasal spray, Place into the nose., Disp: , Rfl:    hydrocortisone (ANUSOL-HC) 2.5 % rectal cream, Apply 1 application. topically 2 (two) times daily. To affected area twice daily (around anus/ hemorrhoid), Disp: 30 g, Rfl: 0   lamoTRIgine (LAMICTAL) 150 MG tablet, Take 1 tablet (150 mg total) by mouth daily., Disp: , Rfl:    levothyroxine (SYNTHROID) 50 MCG tablet, TAKE 1 TABLET EVERY DAY BEFORE BREAKFAST, Disp: 90 tablet, Rfl: 2   lisinopril (ZESTRIL) 10 MG tablet, Take 10 mg by mouth daily., Disp: , Rfl:    lovastatin (MEVACOR) 40 MG tablet, TAKE 1 TABLET EVERY DAY, Disp: 90 tablet, Rfl: 2   metroNIDAZOLE (METROGEL) 1 % gel, Apply topically daily. To nose, cheeks for rosacea, Disp: 45 g, Rfl: 2   nystatin (  MYCOSTATIN/NYSTOP) powder, Apply 1 Application topically 3 (three) times daily. Under abdomen skin fold, Disp: 15 g, Rfl: 2   Probiotic Product (PROBIOTIC ADVANCED PO), Take by mouth., Disp: , Rfl:    zolpidem (AMBIEN) 10 MG tablet, Take 10 mg by mouth at bedtime., Disp: , Rfl:   Social History   Tobacco Use  Smoking Status Never  Smokeless Tobacco Never    Allergies  Allergen Reactions   Ingrezza [Valbenazine Tosylate] Other (See Comments)    Night sweats, worsened tardive dyskinesia, nightmares   Lidocaine Swelling    REACTION: swelling ALL CAINE DRUGS EXCEPT: marcaine & sensercaine Other reaction(s): Edema   Xylocaine [Lidocaine Hcl] Swelling    ALLERGIC TO ALL CAINES EXCEPT SENSORCAINE , AND MARCAINE   Amantadine    Austedo Xr [Deutetrabenazine]     Caused Muscle aches and tremors   Codeine Nausea And Vomiting    REACTION: vomits blood   Lithium Swelling    Tegretol [Carbamazepine] Other (See Comments)    REACTION: skin crawls   Objective:  There were no vitals filed for this visit. Body mass index is 41.68 kg/m. Constitutional Well developed. Well nourished.  Vascular Dorsalis pedis pulses palpable bilaterally. Posterior tibial pulses palpable bilaterally. Capillary refill normal to all digits.  No cyanosis or clubbing noted. Pedal hair growth normal.  Neurologic Normal speech. Oriented to person, place, and time. Epicritic sensation to light touch grossly present bilaterally.  Dermatologic Nails well groomed and normal in appearance. No open wounds. No skin lesions.  Orthopedic: Pain on palpation left lateral foot pain with resisted dorsiflexion eversion of the foot no pain with plantarflexion inversion of the foot.  Pain along the course of the peroneal tendon no pain at the insertion mild pain at the ATFL ligament.   Radiographs: None Assessment:   1. Peroneal tendinitis of left lower extremity   2. Pes planovalgus     Plan:  Patient was evaluated and treated and all questions answered.  Left peroneal tendinitis with underlying painful pes planovalgus deformity -All questions and concerns were discussed with the patient extensive detail given t clinically patient pain has improved considerably she still has some residual pain she will benefit from steroid injection have decreased inflammatory component associate with pain.  Patient agrees with plan to proceed with steroid injection -A steroid injection was performed at left lateral foot point of maximal tenderness using 1% plain Lidocaine and 10 mg of Kenalog. This was well tolerated. -Sugar modification was discussed with over-the-counter counter insoles discussed -She will also benefit from Tri-Lock ankle brace to help transition out of the boot Tri-Lock ankle brace was dispensed  No follow-ups on file.

## 2023-10-22 NOTE — Assessment & Plan Note (Signed)
Recurrent chest pain at rest concerning for possible unstable angina Troponin low 100s EKG grossly stable apart from?  Type I Mobitz block which may be new HEART score 6+  Status post full dose aspirin Will transition from Eliquis to heparin Prn ntg  Risk stratification labs Cardiology consulted Anticipate stress test vs. Cardiac catheterization  Follow-up recommendations

## 2023-10-22 NOTE — ED Triage Notes (Addendum)
Patient to Urgent Care with complaints of fevers and chills/ generalized body aches/ dry cough. Symptoms started over the weekend. Worsened yesterday.   Reports when walking from one room to another she feels out of breath. Reports last hight she started experiencing mid-sternal chest pain, denies radiation. No longer having chest pain. Believes it was because she was laying on her back.   Reports that on Friday her apple watch alerted her that her HR was 39 for 15 minutes. No symptoms at that time. Had been taking a otc diet supplement. Reports she had an ablation in July d/t low HR.

## 2023-10-22 NOTE — ED Triage Notes (Signed)
Patient to ED via POV for mid-sternal CP and SOB. Started yesterday- having generalized pain. Not currently having CP at this time. EKG done at [facility]- sent to ED due to having a heart block. Had ablation in July. Also, started taking diet pills 1.5 weeks ago.

## 2023-10-22 NOTE — Consult Note (Signed)
PHARMACY - ANTICOAGULATION CONSULT NOTE  Pharmacy Consult for Heparin Indication: NSTEMI  Allergies  Allergen Reactions   Ingrezza [Valbenazine Tosylate] Other (See Comments)    Night sweats, worsened tardive dyskinesia, nightmares   Lidocaine Swelling    REACTION: swelling ALL CAINE DRUGS EXCEPT: marcaine & sensercaine Other reaction(s): Edema   Xylocaine [Lidocaine Hcl] Swelling    ALLERGIC TO ALL CAINES EXCEPT SENSORCAINE , AND MARCAINE   Amantadine    Austedo Xr [Deutetrabenazine]     Caused Muscle aches and tremors   Codeine Nausea And Vomiting    REACTION: vomits blood   Lithium Swelling   Tegretol [Carbamazepine] Other (See Comments)    REACTION: skin crawls    Patient Measurements: Height: 5' 1.5" (156.2 cm) Weight: 98.9 kg (218 lb) IBW/kg (Calculated) : 48.95 Heparin Dosing Weight: 72.5 kg  Vital Signs: Temp: 100.2 F (37.9 C) (11/12 2203) Temp Source: Oral (11/12 2203) BP: 120/58 (11/12 2203) Pulse Rate: 74 (11/12 2203)  Labs: Recent Labs    10/22/23 1151 10/22/23 1400 10/22/23 2219 10/22/23 2220  HGB 10.3*  --   --   --   HCT 31.2*  --   --   --   PLT 248  --   --   --   APTT  --   --  77*  --   LABPROT 19.1*  --   --   --   INR 1.6*  --   --   --   HEPARINUNFRC  --   --   --  >1.10*  CREATININE 1.05*  --   --   --   TROPONINIHS 116* 105*  --   --     Estimated Creatinine Clearance: 58.2 mL/min (A) (by C-G formula based on SCr of 1.05 mg/dL (H)).   Medical History: Past Medical History:  Diagnosis Date   Actinic keratosis 03/29/2021   R central upper lip   Allergic rhinitis    Allergy    Anxiety    Arthritis    Atypical mole 12/26/2021   spinal upper back, exc 01/15/22   Basal cell carcinoma 10/16/2010   R lower pretibia    Bipolar 1 disorder (HCC)    Depression    Fissure, anal    GERD (gastroesophageal reflux disease)    History of basal cell carcinoma excision    ON FACE   History of squamous cell carcinoma excision    ON  RIGHT LEG   Hyperlipidemia    Hypothyroidism    IBS (irritable bowel syndrome)    Liver cyst    Malignant melanoma in situ (HCC) 11/20/2022   Right labia minora. Excision 12/25/2022 at anterior vulva, left of clitoral hood, margins involved. Surgery scheduled for 02/12/2023.   Mallet finger of left hand    small finger   NSTEMI (non-ST elevated myocardial infarction) (HCC) 10/22/2023   Pre-diabetes     Medications:  Medications Prior to Admission  Medication Sig Dispense Refill Last Dose   apixaban (ELIQUIS) 5 MG TABS tablet Take 5 mg by mouth 2 (two) times daily.   10/22/2023 at 0800   ARIPiprazole (ABILIFY) 10 MG tablet Take 10 mg by mouth daily.   10/21/2023 at 2200   BIOTIN PO Take 1 tablet by mouth daily.   10/21/2023 at 2200   buPROPion (WELLBUTRIN XL) 300 MG 24 hr tablet Take 300 mg by mouth daily.   10/22/2023 at 0800   cetirizine (ZYRTEC ALLERGY) 10 MG tablet Take 10 mg by mouth daily as needed  for allergies.   10/22/2023 at 0800   Cholecalciferol (VITAMIN D) 2000 UNITS CAPS Take 1 capsule by mouth daily.   10/21/2023 at 2200   clonazePAM (KLONOPIN) 1 MG tablet Take 1 mg by mouth at bedtime.   10/21/2023 at 2200   famotidine (PEPCID) 20 MG tablet Take 1 tablet (20 mg total) by mouth 2 (two) times daily. 180 tablet 1 10/22/2023 at 0800   fluticasone (FLONASE) 50 MCG/ACT nasal spray Place into the nose.   10/21/2023 at 2200   hydrocortisone (ANUSOL-HC) 2.5 % rectal cream Apply 1 application. topically 2 (two) times daily. To affected area twice daily (around anus/ hemorrhoid) 30 g 0 prn at unk   lamoTRIgine (LAMICTAL) 150 MG tablet Take 1 tablet (150 mg total) by mouth daily.   10/21/2023 at 2200   levothyroxine (SYNTHROID) 50 MCG tablet TAKE 1 TABLET EVERY DAY BEFORE BREAKFAST 90 tablet 2 10/22/2023 at 0745   lisinopril (ZESTRIL) 10 MG tablet Take 10 mg by mouth daily.   10/22/2023 at 0800   lovastatin (MEVACOR) 40 MG tablet TAKE 1 TABLET EVERY DAY 90 tablet 2 10/21/2023 at 2000    nystatin (MYCOSTATIN/NYSTOP) powder Apply 1 Application topically 3 (three) times daily. Under abdomen skin fold 15 g 2 Past Week   Probiotic Product (PROBIOTIC ADVANCED PO) Take by mouth.   10/21/2023 at 1400   tiZANidine (ZANAFLEX) 4 MG tablet Take 4 mg by mouth 2 (two) times daily.   Past Week at prn   zolpidem (AMBIEN) 10 MG tablet Take 10 mg by mouth at bedtime.   10/21/2023 at 2200   cyanocobalamin (VITAMIN B12) 1000 MCG tablet Take by mouth. (Patient not taking: Reported on 10/22/2023)   Not Taking   metroNIDAZOLE (METROGEL) 1 % gel Apply topically daily. To nose, cheeks for rosacea (Patient not taking: Reported on 10/22/2023) 45 g 2 Not Taking   Scheduled:   aspirin EC  81 mg Oral Daily   lamoTRIgine  150 mg Oral QHS   [START ON 10/23/2023] levothyroxine  50 mcg Oral Q0600   Infusions:   heparin 950 Units/hr (10/22/23 1738)   PRN: acetaminophen, ondansetron (ZOFRAN) IV Anti-infectives (From admission, onward)    None       Assessment: Pharmacy consulted for heparin for concern for NSTEMI and evaluation of 24 hours of chest pain describes as achy pressure in chest. Pt is on apixaban for afib. Trop 105. Hgb slightly low from baseline and plt stable. Baseline heparin level will be ordered.   Goal of Therapy:  Heparin level 0.3-0.7 units/ml when aPTT and heparin level correlate.  aPTT 66-102 seconds Monitor platelets by anticoagulation protocol: Yes  11/12 2219 aPTT 77, therapeutic x 1 / HL > 1.1 not correlating   Plan:  Continue heparin infusion at 950 units/hr.  Recheck aPTT level w/ AM labs to confirm Recheck HL daily until correlation confirmed CBC daily while on heparin.  Otelia Sergeant, PharmD, [facility] 10/22/2023 11:14 PM

## 2023-10-22 NOTE — ED Notes (Signed)
MD Derrill Kay informed of trop of 116

## 2023-10-22 NOTE — Discharge Instructions (Signed)
Go to the emergency department for your abnormal EKG, shortness of breath, and the chest pain that you had last night.

## 2023-10-22 NOTE — Assessment & Plan Note (Signed)
Continue Synthroid °

## 2023-10-22 NOTE — Consult Note (Signed)
[facility] CLINIC CARDIOLOGY CONSULT NOTE       Patient ID: [name] [name] MRN: [id number] DOB/AGE: 06/15/1956 67 y.o.  Admit date: 10/22/2023 Referring Physician Dr. Doree Albee Primary Physician Tower, Audrie Gallus, MD  Primary Cardiologist Dr. Dorothyann Peng Reason for Consultation chest pain  HPI: [name] [name] is a 67 y.o. female  with a past medical history of atrial flutter s/p ablation 06/2023, hypertension, hyperlipidemia, hypothyroidism, obesity who presented to the ED on 10/22/2023 for shortness of breath, chest pain. Cardiology was consulted for further evaluation.   Patient reports that about 2 days ago she began noticing some shortness of breath.  Then yesterday she began experiencing fever, chills, body aches, cough.  Reports her highest temperature was 101.  Last night when laying down to go to sleep she began experiencing centralized chest pain.  Reports that she went to sleep and when she woke up the pain was gone.  She went to urgent care earlier given her symptoms and was sent to the ED for further evaluation.  Workup in the ED notable for creatinine 1.05, potassium 4.0, sodium 134, hemoglobin 10.3, WBC 10.3.  Troponins mildly elevated but flat 116 > 105.  EKG with baseline artifact but appears to be second-degree AV block type I.   At the time of my evaluation this afternoon the patient is resting comfortably in ED stretcher.  Discussed her recent symptoms in detail.  She also reports nausea but no vomiting, and has had diarrhea within the last 2 days.  Denies any palpitations or syncope.  Denies any recurrence of chest pain symptoms.  Reports some dizziness last night but no recurrence today.  Review of systems complete and found to be negative unless listed above    Past Medical History:  Diagnosis Date   Actinic keratosis 03/29/2021   R central upper lip   Allergic rhinitis    Allergy    Anxiety    Arthritis    Atypical mole 12/26/2021   spinal upper back, exc  01/15/22   Basal cell carcinoma 10/16/2010   R lower pretibia    Bipolar 1 disorder (HCC)    Depression    Fissure, anal    GERD (gastroesophageal reflux disease)    History of basal cell carcinoma excision    ON FACE   History of squamous cell carcinoma excision    ON RIGHT LEG   Hyperlipidemia    Hypothyroidism    IBS (irritable bowel syndrome)    Liver cyst    Malignant melanoma in situ (HCC) 11/20/2022   Right labia minora. Excision 12/25/2022 at anterior vulva, left of clitoral hood, margins involved. Surgery scheduled for 02/12/2023.   Mallet finger of left hand    small finger   NSTEMI (non-ST elevated myocardial infarction) (HCC) 10/22/2023   Pre-diabetes     Past Surgical History:  Procedure Laterality Date   ABLATION     CARDIAC CATHETERIZATION  04/10/1999   per pt normal   CARDIOVASCULAR STRESS TEST  02-23-2011  dr Iantha Fallen [facility] clinic)   normal nuclear study/  no ischemia/  ef 64%   CATARACT EXTRACTION W/ INTRAOCULAR LENS  IMPLANT, BILATERAL  12/10/2001   Bil   CESAREAN SECTION  12/10/1982   CLOSED REDUCTION FINGER WITH PERCUTANEOUS PINNING Left 04/22/2014   Procedure: LEFT SMALL FINGER CLOSED REDUCTION PINNING;  Surgeon: Sharma Covert, MD;  Location: [facility] [place];  Service: Orthopedics;  Laterality: Left;   COLONOSCOPY  11/09/2002   DILATION AND CURETTAGE  OF UTERUS  1977   LAPAROSCOPIC CHOLECYSTECTOMY  03/18/2000   RECONSTRUCTION OF EYELID Bilateral 08/2022   TRANSTHORACIC ECHOCARDIOGRAM  02/23/2011   normal lvf/  ef 58%/  mild rve/   mild lae/  mild  mr  & tr/  mild pulmonary htn   VULVECTOMY N/A 12/25/2022   Procedure: PARTIAL  VULVECTOMY;  Surgeon: [name], MD;  Location: [facility] [place];  Service: Gynecology;  Laterality: N/A;   VULVECTOMY N/A 02/12/2023   Procedure: WIDE LOCAL EXCISION OF VULVA AND REMOVAL OF CLITORIS;  Surgeon: [name], MD;  Location: [facility];  Service: Gynecology;   Laterality: N/A;   WISDOM TOOTH EXTRACTION      (Not in a hospital admission)  Social History   Socioeconomic History   Marital status: Married    Spouse name: Not on file   Number of children: 1   Years of education: Not on file   Highest education level: Bachelor's degree (e.g., BA, AB, BS)  Occupational History   Occupation: Home    Employer: RETIRED  Tobacco Use   Smoking status: Never   Smokeless tobacco: Never  Vaping Use   Vaping status: Never Used  Substance and Sexual Activity   Alcohol use: Yes    Comment: Only on cruises or rarely   Drug use: No   Sexual activity: Not Currently    Birth control/protection: Post-menopausal  Other Topics Concern   Not on file  Social History Narrative   Moved here from [place]   Social Determinants of Health   Financial Resource Strain: Medium Risk (07/04/2023)   Overall Financial Resource Strain (CARDIA)    Difficulty of Paying Living Expenses: Somewhat hard  Food Insecurity: No Food Insecurity (07/04/2023)   Hunger Vital Sign    Worried About Running Out of Food in the Last Year: Never true    Ran Out of Food in the Last Year: Never true  Transportation Needs: No Transportation Needs (07/04/2023)   PRAPARE - Administrator, Civil Service (Medical): No    Lack of Transportation (Non-Medical): No  Physical Activity: Sufficiently Active (07/04/2023)   Exercise Vital Sign    Days of Exercise per Week: 3 days    Minutes of Exercise per Session: 60 min  Stress: Stress Concern Present (07/04/2023)   [employer] of Occupational Health - Occupational Stress Questionnaire    Feeling of Stress : Rather much  Social Connections: Socially Integrated (07/04/2023)   Social Connection and Isolation Panel [NHANES]    Frequency of Communication with Friends and Family: More than three times a week    Frequency of Social Gatherings with Friends and Family: Once a week    Attends Religious Services: More than 4 times per year     Active Member of [employer] or Organizations: Yes    Attends Engineer, structural: More than 4 times per year    Marital Status: Married  Catering manager Violence: Not At Risk (05/27/2023)   Humiliation, Afraid, Rape, and Kick questionnaire    Fear of Current or Ex-Partner: No    Emotionally Abused: No    Physically Abused: No    Sexually Abused: No    Family History  Problem Relation Age of Onset   Stroke Mother    Heart disease Mother    Hypertension Father    Diabetes Father    Hyperlipidemia Father    Breast cancer Sister    Breast cancer Sister    Diabetes Brother  Colon cancer Neg Hx    Ovarian cancer Neg Hx    Endometrial cancer Neg Hx    Pancreatic cancer Neg Hx    Prostate cancer Neg Hx      Vitals:   10/22/23 1149 10/22/23 1151  BP:  (!) 160/85  Pulse:  83  Resp:  18  Temp:  98.2 F (36.8 C)  TempSrc:  Oral  SpO2:  100%  Weight: 98.9 kg   Height: 5' 1.5" (1.562 m)     PHYSICAL EXAM General: Well-appearing, well nourished, in no acute distress. HEENT: Normocephalic and atraumatic. Neck: No JVD.  Lungs: Normal respiratory effort on room air. Clear bilaterally to auscultation. No wheezes, crackles, rhonchi.  Heart: HRRR. Normal S1 and S2 without gallops or murmurs.  Abdomen: Non-distended appearing.  Msk: Normal strength and tone for age. Extremities: Warm and well perfused. No clubbing, cyanosis.  No edema.  Neuro: Alert and oriented X 3. Psych: Answers questions appropriately.   Labs: Basic Metabolic Panel: Recent Labs    10/22/23 1151  NA 134*  K 4.0  CL 101  CO2 24  GLUCOSE 111*  BUN 10  CREATININE 1.05*  CALCIUM 8.8*   Liver Function Tests: No results for input(s): "AST", "ALT", "ALKPHOS", "BILITOT", "PROT", "ALBUMIN" in the last 72 hours. No results for input(s): "LIPASE", "AMYLASE" in the last 72 hours. CBC: Recent Labs    10/22/23 1151  WBC 10.3  HGB 10.3*  HCT 31.2*  MCV 91.8  PLT 248   Cardiac Enzymes: Recent Labs     10/22/23 1151 10/22/23 1400  TROPONINIHS 116* 105*   BNP: No results for input(s): "BNP" in the last 72 hours. D-Dimer: No results for input(s): "DDIMER" in the last 72 hours. Hemoglobin A1C: No results for input(s): "HGBA1C" in the last 72 hours. Fasting Lipid Panel: No results for input(s): "CHOL", "HDL", "LDLCALC", "TRIG", "CHOLHDL", "LDLDIRECT" in the last 72 hours. Thyroid Function Tests: Recent Labs    10/22/23 1400  TSH 1.290   Anemia Panel: No results for input(s): "VITAMINB12", "FOLATE", "FERRITIN", "TIBC", "IRON", "RETICCTPCT" in the last 72 hours.   Radiology: DG Chest 2 View  Result Date: 10/22/2023 CLINICAL DATA:  cp EXAM: CHEST - 2 VIEW COMPARISON:  None Available. FINDINGS: No pleural effusion. No pneumothorax. Borderline cardiomegaly. There are prominent bilateral interstitial opacities could represent pulmonary venous congestion or atypical infection. No radiographically apparent displaced rib fractures. Visualized upper abdomen is unremarkable. Vertebral body heights are maintained. Surgical clips in the right upper quadrant. IMPRESSION: Prominent bilateral interstitial opacities, which could represent pulmonary venous congestion or atypical infection. Electronically Signed   By: Lorenza Cambridge M.D.   On: 10/22/2023 16:09    ECHO 06/2022: INTERPRETATION  NORMAL LEFT VENTRICULAR SYSTOLIC FUNCTION   WITH MILD LVH  NORMAL RIGHT VENTRICULAR SYSTOLIC FUNCTION  MILD VALVULAR REGURGITATION (See above)  NO VALVULAR STENOSIS  IRREGULAR HEART RHYTHM CAPTURED THROUGHOUT EXAM  ESTIMATED LVEF >55%  MILD MR, TR  MILD PHTN (RVSP: )   TELEMETRY reviewed by me 10/22/2023: 2nd degree AVB type I, rate 50-60s  EKG reviewed by me: mobitz type I rate 59 bpm  Data reviewed by me 10/22/2023: last 24h vitals tele labs imaging I/O ED provider note, admission H&P  Principal Problem:   Chest pain Active Problems:   Hypothyroidism   Bipolar disorder (HCC)   Paroxysmal  atrial fibrillation (HCC)   NSTEMI (non-ST elevated myocardial infarction) (HCC)    ASSESSMENT AND PLAN:  [name] [name] is a 67 y.o. female  with a past medical history of atrial flutter s/p ablation 06/2023, hypertension, hyperlipidemia, hypothyroidism, obesity who presented to the ED on 10/22/2023 for shortness of breath, chest pain. Cardiology was consulted for further evaluation.   # Chest pain # Elevated troponin Patient presented with 1 episode of central chest pain which began yesterday evening when laying down for bed.  Patient went to sleep and it was gone by the morning.  Denies any recurrence.  Troponins upon admission 116 > 105.  -Will plan for nuclear stress test for further evaluation. -Heparin infusion -S/p ASA 325 in the ED.  Continue aspirin 81 mg daily.  # 2nd degree AVB type I # Atrial flutter s/p ablation 06/2023 Patient with hx of atrial flutter, underwent ablation 06/2023. Now presenting with mobitz type I on EKG and tele, HR stable in the 50-60s.  -Continue to monitor HR.   This patient's plan of care was discussed and created with Dr. Juliann Pares and he is in agreement.  Signed: Gale Journey, PA-C  10/22/2023, 5:03 PM [facility] Cardiology

## 2023-10-23 ENCOUNTER — Inpatient Hospital Stay: Payer: Medicare HMO

## 2023-10-23 ENCOUNTER — Ambulatory Visit: Payer: Medicare HMO | Admitting: Family Medicine

## 2023-10-23 DIAGNOSIS — I214 Non-ST elevation (NSTEMI) myocardial infarction: Secondary | ICD-10-CM | POA: Diagnosis not present

## 2023-10-23 LAB — NM MYOCAR MULTI W/SPECT W/WALL MOTION / EF
Base ST Depression (mm): 0 mm
Estimated workload: 1
Exercise duration (min): 1 min
Exercise duration (sec): 0 s
LV dias vol: 58 mL (ref 46–106)
LV sys vol: 16 mL
MPHR: 155 {beats}/min
Nuc Stress EF: 72 %
Peak HR: 91 {beats}/min
Percent HR: 58 %
Rest HR: 74 {beats}/min
Rest Nuclear Isotope Dose: 10.7 mCi
SDS: 0
SRS: 1
SSS: 2
ST Depression (mm): 0 mm
Stress Nuclear Isotope Dose: 32.5 mCi
TID: 1.09

## 2023-10-23 LAB — BASIC METABOLIC PANEL
Anion gap: 11 (ref 5–15)
BUN: 11 mg/dL (ref 8–23)
CO2: 23 mmol/L (ref 22–32)
Calcium: 8.7 mg/dL — ABNORMAL LOW (ref 8.9–10.3)
Chloride: 100 mmol/L (ref 98–111)
Creatinine, Ser: 0.99 mg/dL (ref 0.44–1.00)
GFR, Estimated: >60 mL/min (ref >60)
Glucose, Bld: 101 mg/dL — ABNORMAL HIGH (ref 70–99)
Potassium: 4.3 mmol/L (ref 3.5–5.1)
Sodium: 134 mmol/L — ABNORMAL LOW (ref 135–145)

## 2023-10-23 LAB — APTT
aPTT: 87 s — ABNORMAL HIGH (ref 24–36)
aPTT: 98 s — ABNORMAL HIGH (ref 24–36)

## 2023-10-23 LAB — LIPID PANEL
Cholesterol: 111 mg/dL (ref 0–200)
HDL: 55 mg/dL (ref >40)
LDL Cholesterol: 43 mg/dL (ref 0–99)
Total CHOL/HDL Ratio: 2 {ratio}
Triglycerides: 66 mg/dL (ref <150)
VLDL: 13 mg/dL (ref 0–40)

## 2023-10-23 LAB — CBC
HCT: 30 % — ABNORMAL LOW (ref 36.0–46.0)
Hemoglobin: 9.8 g/dL — ABNORMAL LOW (ref 12.0–15.0)
MCH: 29.6 pg (ref 26.0–34.0)
MCHC: 32.7 g/dL (ref 30.0–36.0)
MCV: 90.6 fL (ref 80.0–100.0)
Platelets: 246 10*3/uL (ref 150–400)
RBC: 3.31 MIL/uL — ABNORMAL LOW (ref 3.87–5.11)
RDW: 13.2 % (ref 11.5–15.5)
WBC: 10.3 10*3/uL (ref 4.0–10.5)
nRBC: 0 % (ref 0.0–0.2)

## 2023-10-23 LAB — TROPONIN I (HIGH SENSITIVITY): Troponin I (High Sensitivity): 89 ng/L — ABNORMAL HIGH (ref <18)

## 2023-10-23 LAB — HIV ANTIBODY (ROUTINE TESTING W REFLEX): HIV Screen 4th Generation wRfx: NONREACTIVE

## 2023-10-23 MED ORDER — REGADENOSON 0.4 MG/5ML IV SOLN
0.4000 mg | Freq: Once | INTRAVENOUS | Status: AC
  Administered 2023-10-23: 0.4 mg via INTRAVENOUS

## 2023-10-23 MED ORDER — BUPROPION HCL ER (XL) 150 MG PO TB24
300.0000 mg | ORAL_TABLET | Freq: Every day | ORAL | Status: DC
  Administered 2023-10-24: 300 mg via ORAL
  Filled 2023-10-23: qty 2

## 2023-10-23 MED ORDER — TECHNETIUM TC 99M TETROFOSMIN IV KIT
10.7300 | PACK | Freq: Once | INTRAVENOUS | Status: AC | PRN
Start: 2023-10-23 — End: 2023-10-23
  Administered 2023-10-23: 10.73 via INTRAVENOUS

## 2023-10-23 MED ORDER — TECHNETIUM TC 99M TETROFOSMIN IV KIT
32.4500 | PACK | Freq: Once | INTRAVENOUS | Status: AC | PRN
Start: 2023-10-23 — End: 2023-10-23
  Administered 2023-10-23: 32.45 via INTRAVENOUS

## 2023-10-23 MED ORDER — ZOLPIDEM TARTRATE 5 MG PO TABS
5.0000 mg | ORAL_TABLET | Freq: Every day | ORAL | Status: DC
  Administered 2023-10-23: 5 mg via ORAL
  Filled 2023-10-23: qty 1

## 2023-10-23 MED ORDER — CLONAZEPAM 1 MG PO TABS
1.0000 mg | ORAL_TABLET | Freq: Every day | ORAL | Status: DC
  Administered 2023-10-23: 1 mg via ORAL
  Filled 2023-10-23: qty 1

## 2023-10-23 MED ORDER — LISINOPRIL 10 MG PO TABS
10.0000 mg | ORAL_TABLET | Freq: Every day | ORAL | Status: DC
  Administered 2023-10-23: 10 mg via ORAL
  Filled 2023-10-23 (×2): qty 1

## 2023-10-23 MED ORDER — LORATADINE 10 MG PO TABS
10.0000 mg | ORAL_TABLET | Freq: Every day | ORAL | Status: DC
Start: 2023-10-23 — End: 2023-10-24
  Administered 2023-10-23 – 2023-10-24 (×2): 10 mg via ORAL
  Filled 2023-10-23 (×2): qty 1

## 2023-10-23 MED ORDER — BETAMETHASONE VALERATE 0.1 % EX OINT
TOPICAL_OINTMENT | Freq: Two times a day (BID) | CUTANEOUS | Status: DC | PRN
  Administered 2023-10-23: 1 via TOPICAL
  Filled 2023-10-23: qty 15

## 2023-10-23 MED ORDER — ACETAMINOPHEN 325 MG PO TABS
650.0000 mg | ORAL_TABLET | Freq: Once | ORAL | Status: AC
  Administered 2023-10-23: 650 mg via ORAL
  Filled 2023-10-23: qty 2

## 2023-10-23 MED ORDER — FAMOTIDINE 20 MG PO TABS
20.0000 mg | ORAL_TABLET | Freq: Every day | ORAL | Status: DC
  Administered 2023-10-23: 20 mg via ORAL
  Filled 2023-10-23: qty 1

## 2023-10-23 MED ORDER — ARIPIPRAZOLE 10 MG PO TABS
10.0000 mg | ORAL_TABLET | Freq: Every day | ORAL | Status: DC
  Administered 2023-10-23 – 2023-10-24 (×2): 10 mg via ORAL
  Filled 2023-10-23 (×2): qty 1

## 2023-10-23 MED ORDER — PRAVASTATIN SODIUM 40 MG PO TABS: 40.0000 mg | ORAL_TABLET | Freq: Every day | ORAL | Status: DC

## 2023-10-23 NOTE — Progress Notes (Signed)
PROGRESS NOTE    [name] [name]   ZOX:[id number] DOB: Aug 19, 1956  DOA: 10/22/2023 Date of Service: 10/23/23 which is hospital day 1  PCP: Judy Pimple, MD    HPI:  [name] [name] is a 66 y.o. female with medical history significant of obesity, GERD, hypothyroidism, hyperlipidemia, atrial fibrillation status post ablation July 2024 presenting with chest pain x1 week, at rest, some nausea.   Hospital course / significant events:  11/12: admitted w/ NSTEMI. Heparin gtt 11/13: results NM stress test pending    Consultants:  Cardiology   Procedures/Surgeries: none      ASSESSMENT & PLAN:   Chest pain Elevated troponin Chest pain at rest concerning for possible unstable angina Status post full dose aspirin Holding Eliquis while on heparin infusion Continue ASA Plan nuclear stress test  Cardiology following  2nd degree AVB type I Atrial flutter s/p ablation 06/2023 Patient with hx of atrial flutter, underwent ablation 06/2023. Now presenting with mobitz type I on EKG and tele, HR stable in the 50-60s.  Continue to monitor HR Holding Eliquis while on heparin infusion as above   Hypothyroidism Continue Synthroid  GERD Continue home famotidine  Insomnia Long time use of Ambien 10 mg at bedtime and clonazepam 1 mg at bedtime  dependence discussed follow outpatient   Vaginal irritation d/t use medicated wipe Have asked pharmacy to suggest low potency topical steroid for use 1-2 applications    obesity based on BMI: Body mass index is 40.52 kg/m.  Underweight - under 18.5  normal weight - 18.5 to 24.9 overweight - 25 to 29.9 obese - 30 or more   DVT prophylaxis: onheparin infusion  IV fluids: no continuous IV fluids  Nutrition: cardiac Central lines / invasive devices: none  Code Status: FULL CODE ACP documentation reviewed: 10/23/23 and has advance directive on file in VYNCA, no DNR/MOST  TOC needs: none Barriers to dispo / significant pending items:  await cardiac stress test restuls and cario clearance prior to dc              Subjective / Brief ROS:  Patient reports feeling okay other than some vaginal burning d/t use of a medicated surgical wipe recently, no discharge or itching  Denies CP/SOB.  Pain controlled.  Denies new weakness.  Tolerating diet.  Reports no concerns w/ urination/defecation.   Family Communication: husband at bedside on rounds     Objective Findings:  Vitals:   10/22/23 2203 10/23/23 0354 10/23/23 0757 10/23/23 1609  BP: (!) 120/58 123/74 115/65 132/62  Pulse: 74 72 67 75  Resp: 18 18 16 16   Temp: 100.2 F (37.9 C) (!) 100.7 F (38.2 C) 98.6 F (37 C) 98.9 F (37.2 C)  TempSrc: Oral Oral  Oral  SpO2: 96% 94% 96% 97%  Weight:      Height:        Intake/Output Summary (Last 24 hours) at 10/23/2023 1741 Last data filed at 10/23/2023 8119 Gross per 24 hour  Intake 223.99 ml  Output 1 ml  Net 222.99 ml   Filed Weights   10/22/23 1149  Weight: 98.9 kg    Examination:  Physical Exam Cardiovascular:     Rate and Rhythm: Normal rate and regular rhythm.     Heart sounds: Normal heart sounds.  Pulmonary:     Effort: Pulmonary effort is normal.     Breath sounds: Normal breath sounds.  Skin:    General: Skin is warm and dry.  Neurological:  General: No focal deficit present.     Mental Status: She is alert and oriented to person, place, and time.          Scheduled Medications:   ARIPiprazole  10 mg Oral Daily   aspirin EC  81 mg Oral Daily   [START ON 10/24/2023] buPROPion  300 mg Oral Daily   clonazePAM  1 mg Oral QHS   famotidine  20 mg Oral QHS   lamoTRIgine  150 mg Oral QHS   levothyroxine  50 mcg Oral Q0600   lisinopril  10 mg Oral Daily   loratadine  10 mg Oral Daily   [START ON 10/24/2023] pravastatin  40 mg Oral q1800   zolpidem  5 mg Oral QHS    Continuous Infusions:  heparin 950 Units/hr (10/23/23 1544)    PRN Medications:  acetaminophen,  betamethasone valerate lotion, ondansetron (ZOFRAN) IV  Antimicrobials from admission:  Anti-infectives (From admission, onward)    None           Data Reviewed:  I have personally reviewed the following...  CBC: Recent Labs  Lab 10/22/23 1151 10/23/23 0133  WBC 10.3 10.3  HGB 10.3* 9.8*  HCT 31.2* 30.0*  MCV 91.8 90.6  PLT 248 246   Basic Metabolic Panel: Recent Labs  Lab 10/22/23 1151 10/23/23 0133  NA 134* 134*  K 4.0 4.3  CL 101 100  CO2 24 23  GLUCOSE 111* 101*  BUN 10 11  CREATININE 1.05* 0.99  CALCIUM 8.8* 8.7*   GFR: Estimated Creatinine Clearance: 61.7 mL/min (by C-G formula based on SCr of 0.99 mg/dL). Liver Function Tests: No results for input(s): "AST", "ALT", "ALKPHOS", "BILITOT", "PROT", "ALBUMIN" in the last 168 hours. No results for input(s): "LIPASE", "AMYLASE" in the last 168 hours. No results for input(s): "AMMONIA" in the last 168 hours. Coagulation Profile: Recent Labs  Lab 10/22/23 1151  INR 1.6*   Cardiac Enzymes: No results for input(s): "CKTOTAL", "CKMB", "CKMBINDEX", "TROPONINI" in the last 168 hours. BNP (last 3 results) No results for input(s): "PROBNP" in the last 8760 hours. HbA1C: No results for input(s): "HGBA1C" in the last 72 hours. CBG: No results for input(s): "GLUCAP" in the last 168 hours. Lipid Profile: Recent Labs    10/23/23 0133  CHOL 111  HDL 55  LDLCALC 43  TRIG 66  CHOLHDL 2.0   Thyroid Function Tests: Recent Labs    10/22/23 1400  TSH 1.290   Anemia Panel: No results for input(s): "VITAMINB12", "FOLATE", "FERRITIN", "TIBC", "IRON", "RETICCTPCT" in the last 72 hours. Most Recent Urinalysis On File:     Component Value Date/Time   COLORURINE YELLOW (A) 10/22/2023 1445   APPEARANCEUR CLEAR (A) 10/22/2023 1445   LABSPEC 1.006 10/22/2023 1445   PHURINE 6.0 10/22/2023 1445   GLUCOSEU NEGATIVE 10/22/2023 1445   HGBUR NEGATIVE 10/22/2023 1445   HGBUR large 07/29/2008 1001   BILIRUBINUR  NEGATIVE 10/22/2023 1445   BILIRUBINUR Negeative 05/30/2023 1215   KETONESUR NEGATIVE 10/22/2023 1445   PROTEINUR NEGATIVE 10/22/2023 1445   UROBILINOGEN 0.2 05/30/2023 1215   UROBILINOGEN 0.2 07/29/2008 1001   NITRITE NEGATIVE 10/22/2023 1445   LEUKOCYTESUR NEGATIVE 10/22/2023 1445   Sepsis Labs: @LABRCNTIP (procalcitonin:4,lacticidven:4) Microbiology: Recent Results (from the past 240 hour(s))  Resp panel by RT-PCR (RSV, Flu A&B, Covid) Anterior Nasal Swab     Status: None   Collection Time: 10/22/23 11:57 AM   Specimen: Anterior Nasal Swab  Result Value Ref Range Status   SARS Coronavirus 2 by RT PCR  NEGATIVE NEGATIVE Final    Comment: (NOTE) SARS-CoV-2 target nucleic acids are NOT DETECTED.  The SARS-CoV-2 RNA is generally detectable in upper respiratory specimens during the acute phase of infection. The lowest concentration of SARS-CoV-2 viral copies this assay can detect is 138 copies/mL. A negative result does not preclude SARS-Cov-2 infection and should not be used as the sole basis for treatment or other patient management decisions. A negative result may occur with  improper specimen collection/handling, submission of specimen other than nasopharyngeal swab, presence of viral mutation(s) within the areas targeted by this assay, and inadequate number of viral copies(<138 copies/mL). A negative result must be combined with clinical observations, patient history, and epidemiological information. The expected result is Negative.  Fact Sheet for Patients:  BloggerCourse.com  Fact Sheet for Healthcare Providers:  SeriousBroker.it  This test is no t yet approved or cleared by the [place] FDA and  has been authorized for detection and/or diagnosis of SARS-CoV-2 by FDA under an Emergency Use Authorization (EUA). This EUA will remain  in effect (meaning this test can be used) for the duration of the COVID-19 declaration  under Section 564(b)(1) of the Act, 21 U.S.C.section 360bbb-3(b)(1), unless the authorization is terminated  or revoked sooner.       Influenza A by PCR NEGATIVE NEGATIVE Final   Influenza B by PCR NEGATIVE NEGATIVE Final    Comment: (NOTE) The Xpert Xpress SARS-CoV-2/FLU/RSV plus assay is intended as an aid in the diagnosis of influenza from Nasopharyngeal swab specimens and should not be used as a sole basis for treatment. Nasal washings and aspirates are unacceptable for Xpert Xpress SARS-CoV-2/FLU/RSV testing.  Fact Sheet for Patients: BloggerCourse.com  Fact Sheet for Healthcare Providers: SeriousBroker.it  This test is not yet approved or cleared by the [place] FDA and has been authorized for detection and/or diagnosis of SARS-CoV-2 by FDA under an Emergency Use Authorization (EUA). This EUA will remain in effect (meaning this test can be used) for the duration of the COVID-19 declaration under Section 564(b)(1) of the Act, 21 U.S.C. section 360bbb-3(b)(1), unless the authorization is terminated or revoked.     Resp Syncytial Virus by PCR NEGATIVE NEGATIVE Final    Comment: (NOTE) Fact Sheet for Patients: BloggerCourse.com  Fact Sheet for Healthcare Providers: SeriousBroker.it  This test is not yet approved or cleared by the [place] FDA and has been authorized for detection and/or diagnosis of SARS-CoV-2 by FDA under an Emergency Use Authorization (EUA). This EUA will remain in effect (meaning this test can be used) for the duration of the COVID-19 declaration under Section 564(b)(1) of the Act, 21 U.S.C. section 360bbb-3(b)(1), unless the authorization is terminated or revoked.  Performed at [facility], [address] [place], [place] [address]       Radiology Studies last 3 days: DG Chest 2 View  Result Date: 10/22/2023 CLINICAL  DATA:  cp EXAM: CHEST - 2 VIEW COMPARISON:  None Available. FINDINGS: No pleural effusion. No pneumothorax. Borderline cardiomegaly. There are prominent bilateral interstitial opacities could represent pulmonary venous congestion or atypical infection. No radiographically apparent displaced rib fractures. Visualized upper abdomen is unremarkable. Vertebral body heights are maintained. Surgical clips in the right upper quadrant. IMPRESSION: Prominent bilateral interstitial opacities, which could represent pulmonary venous congestion or atypical infection. Electronically Signed   By: Lorenza Cambridge M.D.   On: 10/22/2023 16:09       Sunnie Nielsen, DO [facility] 10/23/2023, 5:41 PM    Dictation software may have been  used to generate the above note. Typos may occur and escape review in typed/dictated notes. Please contact Dr Lyn Hollingshead directly for clarity if needed.  Staff may message me via secure chat in Epic  but this may not receive an immediate response,  please page me for urgent matters!  If 7PM-7AM, please contact night coverage [email or web address]

## 2023-10-23 NOTE — Hospital Course (Addendum)
HPI:  [name] [name] is a 67 y.o. female with medical history significant of obesity, GERD, hypothyroidism, hyperlipidemia, atrial fibrillation status post ablation July 2024 presenting with chest pain x1 week, at rest, some nausea.   Hospital course / significant events:  11/12: admitted w/ NSTEMI. Heparin gtt 11/13: results NM stress test pending   11/14: no concerns on stress test, cardiology clears for d/c w/ outpatient f/u  Consultants:  Cardiology   Procedures/Surgeries: none      ASSESSMENT & PLAN:   Chest pain Elevated troponin Continue ASA, restart Eliquis on discharge Cardiology following outpatient   2nd degree AVB type I Atrial flutter s/p ablation 06/2023 Patient with hx of atrial flutter, underwent ablation 06/2023. Now presenting with mobitz type I on EKG and tele, HR stable in the 50-60s.  Continue to monitor HR Restart eliquis   Hypothyroidism Continue Synthroid  GERD Continue home famotidine  Insomnia Long time use of Ambien 10 mg at bedtime and clonazepam 1 mg at bedtime  dependence discussed follow outpatient   Vaginal irritation d/t use medicated wipe Resolved   Febrile illness POA Suspect false negative COVID testing vs other viral process Follow w/ PCP / seek emergency care if not improving    obesity based on BMI: Body mass index is 40.52 kg/m.  Underweight - under 18.5  normal weight - 18.5 to 24.9 overweight - 25 to 29.9 obese - 30 or more   DVT prophylaxis: onheparin infusion  IV fluids: no continuous IV fluids  Nutrition: cardiac Central lines / invasive devices: none  Code Status: FULL CODE ACP documentation reviewed: 10/23/23 and has advance directive on file in VYNCA, no DNR/MOST  TOC needs: none Barriers to dispo / significant pending items: await cardiac stress test restuls and cario clearance prior to dc

## 2023-10-23 NOTE — Plan of Care (Signed)

## 2023-10-23 NOTE — Consult Note (Signed)
PHARMACY - ANTICOAGULATION CONSULT NOTE  Pharmacy Consult for Heparin Indication: NSTEMI  Allergies  Allergen Reactions   Ingrezza [Valbenazine Tosylate] Other (See Comments)    Night sweats, worsened tardive dyskinesia, nightmares   Lidocaine Swelling    REACTION: swelling ALL CAINE DRUGS EXCEPT: marcaine & sensercaine Other reaction(s): Edema   Xylocaine [Lidocaine Hcl] Swelling    ALLERGIC TO ALL CAINES EXCEPT SENSORCAINE , AND MARCAINE   Amantadine    Austedo Xr [Deutetrabenazine]     Caused Muscle aches and tremors   Codeine Nausea And Vomiting    REACTION: vomits blood   Lithium Swelling   Tegretol [Carbamazepine] Other (See Comments)    REACTION: skin crawls    Patient Measurements: Height: 5' 1.5" (156.2 cm) Weight: 98.9 kg (218 lb) IBW/kg (Calculated) : 48.95 Heparin Dosing Weight: 72.5 kg  Vital Signs: Temp: 100.7 F (38.2 C) (11/13 0354) Temp Source: Oral (11/13 0354) BP: 123/74 (11/13 0354) Pulse Rate: 72 (11/13 0354)  Labs: Recent Labs    10/22/23 1151 10/22/23 1400 10/22/23 2219 10/22/23 2220 10/23/23 0133  HGB 10.3*  --   --   --  9.8*  HCT 31.2*  --   --   --  30.0*  PLT 248  --   --   --  246  APTT  --   --  77*  --  87*  LABPROT 19.1*  --   --   --   --   INR 1.6*  --   --   --   --   HEPARINUNFRC  --   --   --  >1.10*  --   CREATININE 1.05*  --   --   --  0.99  TROPONINIHS 116* 105* 107*  --  89*    Estimated Creatinine Clearance: 61.7 mL/min (by C-G formula based on SCr of 0.99 mg/dL).   Medical History: Past Medical History:  Diagnosis Date   Actinic keratosis 03/29/2021   R central upper lip   Allergic rhinitis    Allergy    Anxiety    Arthritis    Atypical mole 12/26/2021   spinal upper back, exc 01/15/22   Basal cell carcinoma 10/16/2010   R lower pretibia    Bipolar 1 disorder (HCC)    Depression    Fissure, anal    GERD (gastroesophageal reflux disease)    History of basal cell carcinoma excision    ON FACE    History of squamous cell carcinoma excision    ON RIGHT LEG   Hyperlipidemia    Hypothyroidism    IBS (irritable bowel syndrome)    Liver cyst    Malignant melanoma in situ (HCC) 11/20/2022   Right labia minora. Excision 12/25/2022 at anterior vulva, left of clitoral hood, margins involved. Surgery scheduled for 02/12/2023.   Mallet finger of left hand    small finger   NSTEMI (non-ST elevated myocardial infarction) (HCC) 10/22/2023   Pre-diabetes     Medications:  Medications Prior to Admission  Medication Sig Dispense Refill Last Dose   apixaban (ELIQUIS) 5 MG TABS tablet Take 5 mg by mouth 2 (two) times daily.   10/22/2023 at 0800   ARIPiprazole (ABILIFY) 10 MG tablet Take 10 mg by mouth daily.   10/21/2023 at 2200   BIOTIN PO Take 1 tablet by mouth daily.   10/21/2023 at 2200   buPROPion (WELLBUTRIN XL) 300 MG 24 hr tablet Take 300 mg by mouth daily.   10/22/2023 at 0800   cetirizine (  ZYRTEC ALLERGY) 10 MG tablet Take 10 mg by mouth daily as needed for allergies.   10/22/2023 at 0800   Cholecalciferol (VITAMIN D) 2000 UNITS CAPS Take 1 capsule by mouth daily.   10/21/2023 at 2200   clonazePAM (KLONOPIN) 1 MG tablet Take 1 mg by mouth at bedtime.   10/21/2023 at 2200   famotidine (PEPCID) 20 MG tablet Take 1 tablet (20 mg total) by mouth 2 (two) times daily. 180 tablet 1 10/22/2023 at 0800   fluticasone (FLONASE) 50 MCG/ACT nasal spray Place into the nose.   10/21/2023 at 2200   hydrocortisone (ANUSOL-HC) 2.5 % rectal cream Apply 1 application. topically 2 (two) times daily. To affected area twice daily (around anus/ hemorrhoid) 30 g 0 prn at unk   lamoTRIgine (LAMICTAL) 150 MG tablet Take 1 tablet (150 mg total) by mouth daily.   10/21/2023 at 2200   levothyroxine (SYNTHROID) 50 MCG tablet TAKE 1 TABLET EVERY DAY BEFORE BREAKFAST 90 tablet 2 10/22/2023 at 0745   lisinopril (ZESTRIL) 10 MG tablet Take 10 mg by mouth daily.   10/22/2023 at 0800   lovastatin (MEVACOR) 40 MG tablet TAKE 1  TABLET EVERY DAY 90 tablet 2 10/21/2023 at 2000   nystatin (MYCOSTATIN/NYSTOP) powder Apply 1 Application topically 3 (three) times daily. Under abdomen skin fold 15 g 2 Past Week   Probiotic Product (PROBIOTIC ADVANCED PO) Take by mouth.   10/21/2023 at 1400   tiZANidine (ZANAFLEX) 4 MG tablet Take 4 mg by mouth 2 (two) times daily.   Past Week at prn   zolpidem (AMBIEN) 10 MG tablet Take 10 mg by mouth at bedtime.   10/21/2023 at 2200   cyanocobalamin (VITAMIN B12) 1000 MCG tablet Take by mouth. (Patient not taking: Reported on 10/22/2023)   Not Taking   metroNIDAZOLE (METROGEL) 1 % gel Apply topically daily. To nose, cheeks for rosacea (Patient not taking: Reported on 10/22/2023) 45 g 2 Not Taking   Scheduled:   aspirin EC  81 mg Oral Daily   lamoTRIgine  150 mg Oral QHS   levothyroxine  50 mcg Oral Q0600   Infusions:   heparin 950 Units/hr (10/22/23 1738)   PRN: acetaminophen, ondansetron (ZOFRAN) IV Anti-infectives (From admission, onward)    None       Assessment: Pharmacy consulted for heparin for concern for NSTEMI and evaluation of 24 hours of chest pain describes as achy pressure in chest. Pt is on apixaban for afib. Trop 105. Hgb slightly low from baseline and plt stable. Baseline heparin level will be ordered.   Goal of Therapy:  Heparin level 0.3-0.7 units/ml when aPTT and heparin level correlate.  aPTT 66-102 seconds Monitor platelets by anticoagulation protocol: Yes  11/12 2219 aPTT 77, therapeutic x 1 / HL > 1.1 not correlating 11/13 0133 aPTT 87, therapeutic x 2 - drawn early   Plan:  Continue heparin infusion at 950 units/hr Will recheck aPTT level at 1000 to reconfirm, then therapeutic Recheck HL daily until correlation confirmed CBC daily while on heparin.  Otelia Sergeant, PharmD, [facility] 10/23/2023 4:36 AM

## 2023-10-23 NOTE — Plan of Care (Signed)
°  Problem: Clinical Measurements: Goal: Ability to maintain clinical measurements within normal limits will improve Outcome: Progressing   Problem: Clinical Measurements: Goal: Respiratory complications will improve Outcome: Progressing   Problem: Clinical Measurements: Goal: Cardiovascular complication will be avoided Outcome: Progressing   Problem: Activity: Goal: Risk for activity intolerance will decrease Outcome: Progressing   Problem: Safety: Goal: Ability to remain free from injury will improve Outcome: Progressing   

## 2023-10-23 NOTE — Progress Notes (Signed)
[facility] CLINIC CARDIOLOGY PROGRESS NOTE       Patient ID: [name] [name] MRN: [id number] DOB/AGE: 12/10/1956 67 y.o.  Admit date: 10/22/2023 Referring Physician Dr. Doree Albee Primary Physician Tower, Audrie Gallus, MD  Primary Cardiologist Dr. Dorothyann Peng Reason for Consultation chest pain  HPI: [name] [name] is a 67 y.o. female  with a past medical history of atrial flutter s/p ablation 06/2023, hypertension, hyperlipidemia, hypothyroidism, obesity who presented to the ED on 10/22/2023 for shortness of breath, chest pain. Cardiology was consulted for further evaluation.   Interval history: -Patient reports she feels well overall this AM.  -Had another episode of CP last night when laying down to go to sleep, lasted ~30 minutes. States it was not as severe as the episode she had before coming to the ED.  -BP and HR stable. Denies dizziness or palpitations.  Review of systems complete and found to be negative unless listed above    Past Medical History:  Diagnosis Date   Actinic keratosis 03/29/2021   R central upper lip   Allergic rhinitis    Allergy    Anxiety    Arthritis    Atypical mole 12/26/2021   spinal upper back, exc 01/15/22   Basal cell carcinoma 10/16/2010   R lower pretibia    Bipolar 1 disorder (HCC)    Depression    Fissure, anal    GERD (gastroesophageal reflux disease)    History of basal cell carcinoma excision    ON FACE   History of squamous cell carcinoma excision    ON RIGHT LEG   Hyperlipidemia    Hypothyroidism    IBS (irritable bowel syndrome)    Liver cyst    Malignant melanoma in situ (HCC) 11/20/2022   Right labia minora. Excision 12/25/2022 at anterior vulva, left of clitoral hood, margins involved. Surgery scheduled for 02/12/2023.   Mallet finger of left hand    small finger   NSTEMI (non-ST elevated myocardial infarction) (HCC) 10/22/2023   Pre-diabetes     Past Surgical History:  Procedure Laterality Date   ABLATION      CARDIAC CATHETERIZATION  04/10/1999   per pt normal   CARDIOVASCULAR STRESS TEST  02-23-2011  dr Iantha Fallen [facility] clinic)   normal nuclear study/  no ischemia/  ef 64%   CATARACT EXTRACTION W/ INTRAOCULAR LENS  IMPLANT, BILATERAL  12/10/2001   Bil   CESAREAN SECTION  12/10/1982   CLOSED REDUCTION FINGER WITH PERCUTANEOUS PINNING Left 04/22/2014   Procedure: LEFT SMALL FINGER CLOSED REDUCTION PINNING;  Surgeon: Sharma Covert, MD;  Location: [facility] [place];  Service: Orthopedics;  Laterality: Left;   COLONOSCOPY  11/09/2002   DILATION AND CURETTAGE OF UTERUS  1977   LAPAROSCOPIC CHOLECYSTECTOMY  03/18/2000   RECONSTRUCTION OF EYELID Bilateral 08/2022   TRANSTHORACIC ECHOCARDIOGRAM  02/23/2011   normal lvf/  ef 58%/  mild rve/   mild lae/  mild  mr  & tr/  mild pulmonary htn   VULVECTOMY N/A 12/25/2022   Procedure: PARTIAL  VULVECTOMY;  Surgeon: [name], MD;  Location: [facility] [place];  Service: Gynecology;  Laterality: N/A;   VULVECTOMY N/A 02/12/2023   Procedure: WIDE LOCAL EXCISION OF VULVA AND REMOVAL OF CLITORIS;  Surgeon: [name], MD;  Location: [facility] [place];  Service: Gynecology;  Laterality: N/A;   WISDOM TOOTH EXTRACTION      Medications Prior to Admission  Medication Sig Dispense Refill Last Dose   apixaban (ELIQUIS) 5  MG TABS tablet Take 5 mg by mouth 2 (two) times daily.   10/22/2023 at 0800   ARIPiprazole (ABILIFY) 10 MG tablet Take 10 mg by mouth daily.   10/21/2023 at 2200   BIOTIN PO Take 1 tablet by mouth daily.   10/21/2023 at 2200   buPROPion (WELLBUTRIN XL) 300 MG 24 hr tablet Take 300 mg by mouth daily.   10/22/2023 at 0800   cetirizine (ZYRTEC ALLERGY) 10 MG tablet Take 10 mg by mouth daily as needed for allergies.   10/22/2023 at 0800   Cholecalciferol (VITAMIN D) 2000 UNITS CAPS Take 1 capsule by mouth daily.   10/21/2023 at 2200   clonazePAM (KLONOPIN) 1 MG tablet Take 1 mg by mouth at bedtime.    10/21/2023 at 2200   famotidine (PEPCID) 20 MG tablet Take 1 tablet (20 mg total) by mouth 2 (two) times daily. 180 tablet 1 10/22/2023 at 0800   fluticasone (FLONASE) 50 MCG/ACT nasal spray Place into the nose.   10/21/2023 at 2200   hydrocortisone (ANUSOL-HC) 2.5 % rectal cream Apply 1 application. topically 2 (two) times daily. To affected area twice daily (around anus/ hemorrhoid) 30 g 0 prn at unk   lamoTRIgine (LAMICTAL) 150 MG tablet Take 1 tablet (150 mg total) by mouth daily.   10/21/2023 at 2200   levothyroxine (SYNTHROID) 50 MCG tablet TAKE 1 TABLET EVERY DAY BEFORE BREAKFAST 90 tablet 2 10/22/2023 at 0745   lisinopril (ZESTRIL) 10 MG tablet Take 10 mg by mouth daily.   10/22/2023 at 0800   lovastatin (MEVACOR) 40 MG tablet TAKE 1 TABLET EVERY DAY 90 tablet 2 10/21/2023 at 2000   nystatin (MYCOSTATIN/NYSTOP) powder Apply 1 Application topically 3 (three) times daily. Under abdomen skin fold 15 g 2 Past Week   Probiotic Product (PROBIOTIC ADVANCED PO) Take by mouth.   10/21/2023 at 1400   tiZANidine (ZANAFLEX) 4 MG tablet Take 4 mg by mouth 2 (two) times daily.   Past Week at prn   zolpidem (AMBIEN) 10 MG tablet Take 10 mg by mouth at bedtime.   10/21/2023 at 2200   cyanocobalamin (VITAMIN B12) 1000 MCG tablet Take by mouth. (Patient not taking: Reported on 10/22/2023)   Not Taking   metroNIDAZOLE (METROGEL) 1 % gel Apply topically daily. To nose, cheeks for rosacea (Patient not taking: Reported on 10/22/2023) 45 g 2 Not Taking   Social History   Socioeconomic History   Marital status: Married    Spouse name: Not on file   Number of children: 1   Years of education: Not on file   Highest education level: Bachelor's degree (e.g., BA, AB, BS)  Occupational History   Occupation: Home    Employer: RETIRED  Tobacco Use   Smoking status: Never   Smokeless tobacco: Never  Vaping Use   Vaping status: Never Used  Substance and Sexual Activity   Alcohol use: Yes    Comment: Only on  cruises or rarely   Drug use: No   Sexual activity: Not Currently    Birth control/protection: Post-menopausal  Other Topics Concern   Not on file  Social History Narrative   Moved here from [place]   Social Determinants of Health   Financial Resource Strain: Medium Risk (07/04/2023)   Overall Financial Resource Strain (CARDIA)    Difficulty of Paying Living Expenses: Somewhat hard  Food Insecurity: No Food Insecurity (10/22/2023)   Hunger Vital Sign    Worried About Running Out of Food in the Last Year: Never  true    Ran Out of Food in the Last Year: Never true  Transportation Needs: No Transportation Needs (10/22/2023)   PRAPARE - Administrator, Civil Service (Medical): No    Lack of Transportation (Non-Medical): No  Physical Activity: Sufficiently Active (07/04/2023)   Exercise Vital Sign    Days of Exercise per Week: 3 days    Minutes of Exercise per Session: 60 min  Stress: Stress Concern Present (07/04/2023)   [employer] of Occupational Health - Occupational Stress Questionnaire    Feeling of Stress : Rather much  Social Connections: Socially Integrated (07/04/2023)   Social Connection and Isolation Panel [NHANES]    Frequency of Communication with Friends and Family: More than three times a week    Frequency of Social Gatherings with Friends and Family: Once a week    Attends Religious Services: More than 4 times per year    Active Member of Clubs or Organizations: Yes    Attends Banker Meetings: More than 4 times per year    Marital Status: Married  Catering manager Violence: Not At Risk (10/22/2023)   Humiliation, Afraid, Rape, and Kick questionnaire    Fear of Current or Ex-Partner: No    Emotionally Abused: No    Physically Abused: No    Sexually Abused: No    Family History  Problem Relation Age of Onset   Stroke Mother    Heart disease Mother    Hypertension Father    Diabetes Father    Hyperlipidemia Father    Breast cancer  Sister    Breast cancer Sister    Diabetes Brother    Colon cancer Neg Hx    Ovarian cancer Neg Hx    Endometrial cancer Neg Hx    Pancreatic cancer Neg Hx    Prostate cancer Neg Hx      Vitals:   10/22/23 2115 10/22/23 2203 10/23/23 0354 10/23/23 0757  BP: 122/63 (!) 120/58 123/74 115/65  Pulse: 74 74 72 67  Resp: (!) 22 18 18 16   Temp:  100.2 F (37.9 C) (!) 100.7 F (38.2 C) 98.6 F (37 C)  TempSrc:  Oral Oral   SpO2: 98% 96% 94% 96%  Weight:      Height:        PHYSICAL EXAM General: Well-appearing, well nourished, in no acute distress laying at an incline in hospital bed. HEENT: Normocephalic and atraumatic. Neck: No JVD.  Lungs: Normal respiratory effort on room air. Clear bilaterally to auscultation. No wheezes, crackles, rhonchi.  Heart: HRRR. Normal S1 and S2 without gallops or murmurs.  Abdomen: Non-distended appearing.  Msk: Normal strength and tone for age. Extremities: Warm and well perfused. No clubbing, cyanosis.  No edema.  Neuro: Alert and oriented X 3. Psych: Answers questions appropriately.   Labs: Basic Metabolic Panel: Recent Labs    10/22/23 1151 10/23/23 0133  NA 134* 134*  K 4.0 4.3  CL 101 100  CO2 24 23  GLUCOSE 111* 101*  BUN 10 11  CREATININE 1.05* 0.99  CALCIUM 8.8* 8.7*   Liver Function Tests: No results for input(s): "AST", "ALT", "ALKPHOS", "BILITOT", "PROT", "ALBUMIN" in the last 72 hours. No results for input(s): "LIPASE", "AMYLASE" in the last 72 hours. CBC: Recent Labs    10/22/23 1151 10/23/23 0133  WBC 10.3 10.3  HGB 10.3* 9.8*  HCT 31.2* 30.0*  MCV 91.8 90.6  PLT 248 246   Cardiac Enzymes: Recent Labs    10/22/23 1400  10/22/23 2219 10/23/23 0133  TROPONINIHS 105* 107* 89*   BNP: No results for input(s): "BNP" in the last 72 hours. D-Dimer: No results for input(s): "DDIMER" in the last 72 hours. Hemoglobin A1C: No results for input(s): "HGBA1C" in the last 72 hours. Fasting Lipid Panel: Recent Labs     10/23/23 0133  CHOL 111  HDL 55  LDLCALC 43  TRIG 66  CHOLHDL 2.0   Thyroid Function Tests: Recent Labs    10/22/23 1400  TSH 1.290   Anemia Panel: No results for input(s): "VITAMINB12", "FOLATE", "FERRITIN", "TIBC", "IRON", "RETICCTPCT" in the last 72 hours.   Radiology: DG Chest 2 View  Result Date: 10/22/2023 CLINICAL DATA:  cp EXAM: CHEST - 2 VIEW COMPARISON:  None Available. FINDINGS: No pleural effusion. No pneumothorax. Borderline cardiomegaly. There are prominent bilateral interstitial opacities could represent pulmonary venous congestion or atypical infection. No radiographically apparent displaced rib fractures. Visualized upper abdomen is unremarkable. Vertebral body heights are maintained. Surgical clips in the right upper quadrant. IMPRESSION: Prominent bilateral interstitial opacities, which could represent pulmonary venous congestion or atypical infection. Electronically Signed   By: Lorenza Cambridge M.D.   On: 10/22/2023 16:09    ECHO 06/2022: INTERPRETATION  NORMAL LEFT VENTRICULAR SYSTOLIC FUNCTION   WITH MILD LVH  NORMAL RIGHT VENTRICULAR SYSTOLIC FUNCTION  MILD VALVULAR REGURGITATION (See above)  NO VALVULAR STENOSIS  IRREGULAR HEART RHYTHM CAPTURED THROUGHOUT EXAM  ESTIMATED LVEF >55%  MILD MR, TR  MILD PHTN (RVSP: )   TELEMETRY reviewed by me 10/23/2023: sinus rhythm rate 60s/2nd degree AVB type I rate 50s  EKG reviewed by me: mobitz type I rate 59 bpm  Data reviewed by me 10/23/2023: last 24h vitals tele labs imaging I/O hospitalist progress note  Principal Problem:   Chest pain Active Problems:   Hypothyroidism   Bipolar disorder (HCC)   Paroxysmal atrial fibrillation (HCC)   NSTEMI (non-ST elevated myocardial infarction) (HCC)    ASSESSMENT AND PLAN:  [name] [name] is a 67 y.o. female  with a past medical history of atrial flutter s/p ablation 06/2023, hypertension, hyperlipidemia, hypothyroidism, obesity who presented to the ED on  10/22/2023 for shortness of breath, chest pain. Cardiology was consulted for further evaluation.   # Chest pain # Elevated troponin Patient presented with 1 episode of central chest pain which began yesterday evening when laying down for bed.  Patient went to sleep and it was gone by the morning.  Denies any recurrence.  Troponins upon admission 116 > 105.  -Nuclear stress test today for further evaluation. Additional recommendations pending results.  -Heparin infusion -S/p ASA 325 in the ED.  Continue aspirin 81 mg daily.  # 2nd degree AVB type I # Atrial flutter s/p ablation 06/2023 Patient with hx of atrial flutter, underwent ablation 06/2023. Presenting with mobitz type I on EKG and tele, HR stable in the 50-60s.  -Continue to monitor HR.   This patient's plan of care was discussed and created with Dr. Juliann Pares and he is in agreement.  Signed: Gale Journey, PA-C  10/23/2023, 1:57 PM [facility] Cardiology

## 2023-10-23 NOTE — Consult Note (Signed)
PHARMACY - ANTICOAGULATION CONSULT NOTE  Pharmacy Consult for Heparin Indication: NSTEMI  Allergies  Allergen Reactions   Ingrezza [Valbenazine Tosylate] Other (See Comments)    Night sweats, worsened tardive dyskinesia, nightmares   Lidocaine Swelling    REACTION: swelling ALL CAINE DRUGS EXCEPT: marcaine & sensercaine Other reaction(s): Edema   Xylocaine [Lidocaine Hcl] Swelling    ALLERGIC TO ALL CAINES EXCEPT SENSORCAINE , AND MARCAINE   Amantadine    Austedo Xr [Deutetrabenazine]     Caused Muscle aches and tremors   Codeine Nausea And Vomiting    REACTION: vomits blood   Lithium Swelling   Tegretol [Carbamazepine] Other (See Comments)    REACTION: skin crawls    Patient Measurements: Height: 5' 1.5" (156.2 cm) Weight: 98.9 kg (218 lb) IBW/kg (Calculated) : 48.95 Heparin Dosing Weight: 72.5 kg  Vital Signs: Temp: 98.6 F (37 C) (11/13 0757) Temp Source: Oral (11/13 0354) BP: 115/65 (11/13 0757) Pulse Rate: 67 (11/13 0757)  Labs: Recent Labs    10/22/23 1151 10/22/23 1400 10/22/23 2219 10/22/23 2220 10/23/23 0133 10/23/23 0942  HGB 10.3*  --   --   --  9.8*  --   HCT 31.2*  --   --   --  30.0*  --   PLT 248  --   --   --  246  --   APTT  --   --  77*  --  87* 98*  LABPROT 19.1*  --   --   --   --   --   INR 1.6*  --   --   --   --   --   HEPARINUNFRC  --   --   --  >1.10*  --   --   CREATININE 1.05*  --   --   --  0.99  --   TROPONINIHS 116* 105* 107*  --  89*  --     Estimated Creatinine Clearance: 61.7 mL/min (by C-G formula based on SCr of 0.99 mg/dL).   Medical History: Past Medical History:  Diagnosis Date   Actinic keratosis 03/29/2021   R central upper lip   Allergic rhinitis    Allergy    Anxiety    Arthritis    Atypical mole 12/26/2021   spinal upper back, exc 01/15/22   Basal cell carcinoma 10/16/2010   R lower pretibia    Bipolar 1 disorder (HCC)    Depression    Fissure, anal    GERD (gastroesophageal reflux disease)     History of basal cell carcinoma excision    ON FACE   History of squamous cell carcinoma excision    ON RIGHT LEG   Hyperlipidemia    Hypothyroidism    IBS (irritable bowel syndrome)    Liver cyst    Malignant melanoma in situ (HCC) 11/20/2022   Right labia minora. Excision 12/25/2022 at anterior vulva, left of clitoral hood, margins involved. Surgery scheduled for 02/12/2023.   Mallet finger of left hand    small finger   NSTEMI (non-ST elevated myocardial infarction) (HCC) 10/22/2023   Pre-diabetes     Medications:  Medications Prior to Admission  Medication Sig Dispense Refill Last Dose   apixaban (ELIQUIS) 5 MG TABS tablet Take 5 mg by mouth 2 (two) times daily.   10/22/2023 at 0800   ARIPiprazole (ABILIFY) 10 MG tablet Take 10 mg by mouth daily.   10/21/2023 at 2200   BIOTIN PO Take 1 tablet by mouth daily.  10/21/2023 at 2200   buPROPion (WELLBUTRIN XL) 300 MG 24 hr tablet Take 300 mg by mouth daily.   10/22/2023 at 0800   cetirizine (ZYRTEC ALLERGY) 10 MG tablet Take 10 mg by mouth daily as needed for allergies.   10/22/2023 at 0800   Cholecalciferol (VITAMIN D) 2000 UNITS CAPS Take 1 capsule by mouth daily.   10/21/2023 at 2200   clonazePAM (KLONOPIN) 1 MG tablet Take 1 mg by mouth at bedtime.   10/21/2023 at 2200   famotidine (PEPCID) 20 MG tablet Take 1 tablet (20 mg total) by mouth 2 (two) times daily. 180 tablet 1 10/22/2023 at 0800   fluticasone (FLONASE) 50 MCG/ACT nasal spray Place into the nose.   10/21/2023 at 2200   hydrocortisone (ANUSOL-HC) 2.5 % rectal cream Apply 1 application. topically 2 (two) times daily. To affected area twice daily (around anus/ hemorrhoid) 30 g 0 prn at unk   lamoTRIgine (LAMICTAL) 150 MG tablet Take 1 tablet (150 mg total) by mouth daily.   10/21/2023 at 2200   levothyroxine (SYNTHROID) 50 MCG tablet TAKE 1 TABLET EVERY DAY BEFORE BREAKFAST 90 tablet 2 10/22/2023 at 0745   lisinopril (ZESTRIL) 10 MG tablet Take 10 mg by mouth daily.    10/22/2023 at 0800   lovastatin (MEVACOR) 40 MG tablet TAKE 1 TABLET EVERY DAY 90 tablet 2 10/21/2023 at 2000   nystatin (MYCOSTATIN/NYSTOP) powder Apply 1 Application topically 3 (three) times daily. Under abdomen skin fold 15 g 2 Past Week   Probiotic Product (PROBIOTIC ADVANCED PO) Take by mouth.   10/21/2023 at 1400   tiZANidine (ZANAFLEX) 4 MG tablet Take 4 mg by mouth 2 (two) times daily.   Past Week at prn   zolpidem (AMBIEN) 10 MG tablet Take 10 mg by mouth at bedtime.   10/21/2023 at 2200   cyanocobalamin (VITAMIN B12) 1000 MCG tablet Take by mouth. (Patient not taking: Reported on 10/22/2023)   Not Taking   metroNIDAZOLE (METROGEL) 1 % gel Apply topically daily. To nose, cheeks for rosacea (Patient not taking: Reported on 10/22/2023) 45 g 2 Not Taking   Scheduled:   aspirin EC  81 mg Oral Daily   lamoTRIgine  150 mg Oral QHS   levothyroxine  50 mcg Oral Q0600   Infusions:   heparin 950 Units/hr (10/22/23 1738)   PRN: acetaminophen, ondansetron (ZOFRAN) IV Anti-infectives (From admission, onward)    None       Assessment: Pharmacy consulted for heparin for concern for NSTEMI and evaluation of 24 hours of chest pain describes as achy pressure in chest. Pt is on apixaban for afib. Trop 105. Hgb slightly low from baseline and plt stable.   Goal of Therapy:  Heparin level 0.3-0.7 units/ml when aPTT and heparin level correlate.  aPTT 66-102 seconds Monitor platelets by anticoagulation protocol: Yes  Date Time Results Comments 11/12 2219 aPTT 77 therapeutic x 1 / HL > 1.1 not correlating 11/13 0133 aPTT 87 therapeutic x 2 - drawn early 11/13 0942 aPTT 98 Therapeutic x 3 at 950 units/hr   Plan:  Continue heparin infusion at 950 units/hr Change monitoring to AM  aPTT and HL ordered for 11/14 at 0500 CBC daily while on heparin.  An Lannan Rodriguez-Guzman PharmD, BCPS 10/23/2023 10:46 AM

## 2023-10-23 NOTE — Progress Notes (Signed)
Transition of Care [facility]) - Inpatient Brief Assessment   Patient Details  Name: NICOLLE HUSSONG MRN: [id number] Date of Birth: 06/01/1956  [facility] of [facility]) CM/SW Contact:    Truddie Hidden, RN Phone Number: 10/23/2023, 11:06 AM   Clinical Narrative: TOC continuing to follow patient's progress throughout discharge planning.   Transition of Care Asessment:   Patient has primary care physician: Yes Home environment has been reviewed: home Prior level of function:: Independent Prior/Current Home Services: No current home services Social Determinants of Health Reivew: SDOH reviewed no interventions necessary Readmission risk has been reviewed: Yes Transition of care needs: no transition of care needs at this time

## 2023-10-24 DIAGNOSIS — R079 Chest pain, unspecified: Secondary | ICD-10-CM | POA: Diagnosis not present

## 2023-10-24 LAB — CBC
HCT: 27.5 % — ABNORMAL LOW (ref 36.0–46.0)
Hemoglobin: 9.3 g/dL — ABNORMAL LOW (ref 12.0–15.0)
MCH: 30.7 pg (ref 26.0–34.0)
MCHC: 33.8 g/dL (ref 30.0–36.0)
MCV: 90.8 fL (ref 80.0–100.0)
Platelets: 249 10*3/uL (ref 150–400)
RBC: 3.03 MIL/uL — ABNORMAL LOW (ref 3.87–5.11)
RDW: 13.2 % (ref 11.5–15.5)
WBC: 8.1 10*3/uL (ref 4.0–10.5)
nRBC: 0 % (ref 0.0–0.2)

## 2023-10-24 LAB — HEPARIN LEVEL (UNFRACTIONATED): Heparin Unfractionated: 0.99 [IU]/mL — ABNORMAL HIGH (ref 0.30–0.70)

## 2023-10-24 LAB — APTT: aPTT: 102 s — ABNORMAL HIGH (ref 24–36)

## 2023-10-24 MED ORDER — ASPIRIN 81 MG PO TBEC: 81.0000 mg | DELAYED_RELEASE_TABLET | Freq: Every day | ORAL | 0 refills | Status: DC

## 2023-10-24 MED ORDER — LISINOPRIL 5 MG PO TABS: 2.5000 mg | ORAL_TABLET | Freq: Every day | ORAL | Status: DC

## 2023-10-24 MED ORDER — LISINOPRIL 2.5 MG PO TABS: 2.5000 mg | ORAL_TABLET | Freq: Every day | ORAL | 0 refills | Status: AC

## 2023-10-24 MED ORDER — ACETAMINOPHEN 325 MG PO TABS: 650.0000 mg | ORAL_TABLET | ORAL | Status: DC | PRN

## 2023-10-24 NOTE — Progress Notes (Signed)
Discharge instructions reviewed to patient. Provided patient with AVS. PIV's removed. All questions answered.

## 2023-10-24 NOTE — Plan of Care (Signed)
  Problem: Education: Goal: Understanding of cardiac disease, CV risk reduction, and recovery process will improve Outcome: Adequate for Discharge Goal: Individualized Educational Video(s) Outcome: Adequate for Discharge   Problem: Activity: Goal: Ability to tolerate increased activity will improve Outcome: Adequate for Discharge   Problem: Cardiac: Goal: Ability to achieve and maintain adequate cardiovascular perfusion will improve Outcome: Adequate for Discharge   Problem: Health Behavior/Discharge Planning: Goal: Ability to safely manage health-related needs after discharge will improve Outcome: Adequate for Discharge   Problem: Education: Goal: Knowledge of General Education information will improve Description: Including pain rating scale, medication(s)/side effects and non-pharmacologic comfort measures Outcome: Adequate for Discharge   Problem: Health Behavior/Discharge Planning: Goal: Ability to manage health-related needs will improve Outcome: Adequate for Discharge   Problem: Clinical Measurements: Goal: Ability to maintain clinical measurements within normal limits will improve Outcome: Adequate for Discharge Goal: Will remain free from infection Outcome: Adequate for Discharge Goal: Diagnostic test results will improve Outcome: Adequate for Discharge Goal: Respiratory complications will improve Outcome: Adequate for Discharge Goal: Cardiovascular complication will be avoided Outcome: Adequate for Discharge   Problem: Activity: Goal: Risk for activity intolerance will decrease Outcome: Adequate for Discharge   Problem: Nutrition: Goal: Adequate nutrition will be maintained Outcome: Adequate for Discharge   Problem: Coping: Goal: Level of anxiety will decrease Outcome: Adequate for Discharge   Problem: Elimination: Goal: Will not experience complications related to bowel motility Outcome: Adequate for Discharge Goal: Will not experience complications  related to urinary retention Outcome: Adequate for Discharge   Problem: Pain Management: Goal: General experience of comfort will improve Outcome: Adequate for Discharge   Problem: Safety: Goal: Ability to remain free from injury will improve Outcome: Adequate for Discharge   Problem: Skin Integrity: Goal: Risk for impaired skin integrity will decrease Outcome: Adequate for Discharge

## 2023-10-24 NOTE — Consult Note (Signed)
PHARMACY - ANTICOAGULATION CONSULT NOTE  Pharmacy Consult for Heparin Indication: NSTEMI  Allergies  Allergen Reactions   Ingrezza [Valbenazine Tosylate] Other (See Comments)    Night sweats, worsened tardive dyskinesia, nightmares   Lidocaine Swelling    REACTION: swelling ALL CAINE DRUGS EXCEPT: marcaine & sensercaine Other reaction(s): Edema   Xylocaine [Lidocaine Hcl] Swelling    ALLERGIC TO ALL CAINES EXCEPT SENSORCAINE , AND MARCAINE   Amantadine    Austedo Xr [Deutetrabenazine]     Caused Muscle aches and tremors   Codeine Nausea And Vomiting    REACTION: vomits blood   Lithium Swelling   Tegretol [Carbamazepine] Other (See Comments)    REACTION: skin crawls    Patient Measurements: Height: 5' 1.5" (156.2 cm) Weight: 98.9 kg (218 lb) IBW/kg (Calculated) : 48.95 Heparin Dosing Weight: 72.5 kg  Vital Signs: Temp: 97.8 F (36.6 C) (11/14 0804) Temp Source: Oral (11/14 0804) BP: 98/83 (11/14 0804) Pulse Rate: 54 (11/14 0804)  Labs: Recent Labs    10/22/23 1151 10/22/23 1400 10/22/23 2219 10/22/23 2219 10/22/23 2220 10/23/23 0133 10/23/23 0942 10/24/23 0723  HGB 10.3*  --   --   --   --  9.8*  --   --   HCT 31.2*  --   --   --   --  30.0*  --   --   PLT 248  --   --   --   --  246  --   --   APTT  --   --  77*   < >  --  87* 98* 102*  LABPROT 19.1*  --   --   --   --   --   --   --   INR 1.6*  --   --   --   --   --   --   --   HEPARINUNFRC  --   --   --   --  >1.10*  --   --  0.99*  CREATININE 1.05*  --   --   --   --  0.99  --   --   TROPONINIHS 116* 105* 107*  --   --  89*  --   --    < > = values in this interval not displayed.    Estimated Creatinine Clearance: 61.7 mL/min (by C-G formula based on SCr of 0.99 mg/dL).   Medical History: Past Medical History:  Diagnosis Date   Actinic keratosis 03/29/2021   R central upper lip   Allergic rhinitis    Allergy    Anxiety    Arthritis    Atypical mole 12/26/2021   spinal upper back, exc  01/15/22   Basal cell carcinoma 10/16/2010   R lower pretibia    Bipolar 1 disorder (HCC)    Depression    Fissure, anal    GERD (gastroesophageal reflux disease)    History of basal cell carcinoma excision    ON FACE   History of squamous cell carcinoma excision    ON RIGHT LEG   Hyperlipidemia    Hypothyroidism    IBS (irritable bowel syndrome)    Liver cyst    Malignant melanoma in situ (HCC) 11/20/2022   Right labia minora. Excision 12/25/2022 at anterior vulva, left of clitoral hood, margins involved. Surgery scheduled for 02/12/2023.   Mallet finger of left hand    small finger   NSTEMI (non-ST elevated myocardial infarction) (HCC) 10/22/2023   Pre-diabetes  Medications:  Medications Prior to Admission  Medication Sig Dispense Refill Last Dose   apixaban (ELIQUIS) 5 MG TABS tablet Take 5 mg by mouth 2 (two) times daily.   10/22/2023 at 0800   ARIPiprazole (ABILIFY) 10 MG tablet Take 10 mg by mouth daily.   10/21/2023 at 2200   BIOTIN PO Take 1 tablet by mouth daily.   10/21/2023 at 2200   buPROPion (WELLBUTRIN XL) 300 MG 24 hr tablet Take 300 mg by mouth daily.   10/22/2023 at 0800   cetirizine (ZYRTEC ALLERGY) 10 MG tablet Take 10 mg by mouth daily as needed for allergies.   10/22/2023 at 0800   Cholecalciferol (VITAMIN D) 2000 UNITS CAPS Take 1 capsule by mouth daily.   10/21/2023 at 2200   clonazePAM (KLONOPIN) 1 MG tablet Take 1 mg by mouth at bedtime.   10/21/2023 at 2200   famotidine (PEPCID) 20 MG tablet Take 1 tablet (20 mg total) by mouth 2 (two) times daily. 180 tablet 1 10/22/2023 at 0800   fluticasone (FLONASE) 50 MCG/ACT nasal spray Place into the nose.   10/21/2023 at 2200   hydrocortisone (ANUSOL-HC) 2.5 % rectal cream Apply 1 application. topically 2 (two) times daily. To affected area twice daily (around anus/ hemorrhoid) 30 g 0 prn at unk   lamoTRIgine (LAMICTAL) 150 MG tablet Take 1 tablet (150 mg total) by mouth daily.   10/21/2023 at 2200    levothyroxine (SYNTHROID) 50 MCG tablet TAKE 1 TABLET EVERY DAY BEFORE BREAKFAST 90 tablet 2 10/22/2023 at 0745   lisinopril (ZESTRIL) 10 MG tablet Take 10 mg by mouth daily.   10/22/2023 at 0800   lovastatin (MEVACOR) 40 MG tablet TAKE 1 TABLET EVERY DAY 90 tablet 2 10/21/2023 at 2000   nystatin (MYCOSTATIN/NYSTOP) powder Apply 1 Application topically 3 (three) times daily. Under abdomen skin fold 15 g 2 Past Week   Probiotic Product (PROBIOTIC ADVANCED PO) Take by mouth.   10/21/2023 at 1400   tiZANidine (ZANAFLEX) 4 MG tablet Take 4 mg by mouth 2 (two) times daily.   Past Week at prn   zolpidem (AMBIEN) 10 MG tablet Take 10 mg by mouth at bedtime.   10/21/2023 at 2200   cyanocobalamin (VITAMIN B12) 1000 MCG tablet Take by mouth. (Patient not taking: Reported on 10/22/2023)   Not Taking   metroNIDAZOLE (METROGEL) 1 % gel Apply topically daily. To nose, cheeks for rosacea (Patient not taking: Reported on 10/22/2023) 45 g 2 Not Taking   Scheduled:   ARIPiprazole  10 mg Oral Daily   aspirin EC  81 mg Oral Daily   buPROPion  300 mg Oral Daily   clonazePAM  1 mg Oral QHS   famotidine  20 mg Oral QHS   lamoTRIgine  150 mg Oral QHS   levothyroxine  50 mcg Oral Q0600   lisinopril  10 mg Oral Daily   loratadine  10 mg Oral Daily   pravastatin  40 mg Oral q1800   zolpidem  5 mg Oral QHS   Infusions:   heparin 950 Units/hr (10/23/23 1544)   PRN: acetaminophen, betamethasone valerate ointment, ondansetron (ZOFRAN) IV Anti-infectives (From admission, onward)    None      Assessment: Pharmacy consulted for heparin for concern for NSTEMI and evaluation of 24 hours of chest pain describes as achy pressure in chest. Pt is on apixaban for afib. Trop 105. Hgb slightly low from baseline and plt stable.   Goal of Therapy:  Heparin level 0.3-0.7 units/ml when  aPTT and heparin level correlate.  aPTT 66-102 seconds Monitor platelets by anticoagulation protocol:  Yes  Date Time Results Comments 11/12 2219 aPTT 77 therapeutic x 1 / HL > 1.1 not correlating 11/13 0133 aPTT 87 therapeutic x 2 - drawn early 11/13 0942 aPTT 98 Therapeutic x 3 at 950 units/hr 11/14   0723    aPTT 102 Therapeutic x 4 / HL 0.99    Plan:  Decrease HL to 880 units/hr from 950 units/hr due to aPTT being upper limit of normal and up-trending  Re-check aPTT in 6 hours CBC daily while on heparin.  Effie Shy, PharmD Pharmacy Resident  10/24/2023 8:46 AM

## 2023-10-24 NOTE — Progress Notes (Addendum)
[facility] CARDIOLOGY PROGRESS NOTE       Patient ID: [name] [name] MRN: [id number] DOB/AGE: December 14, 1955 67 y.o.  Admit date: 10/22/2023 Referring Physician Dr. Doree Albee Primary Physician Tower, Audrie Gallus, MD  Primary Cardiologist Dr. Dorothyann Peng Reason for Consultation chest pain  HPI: [name] [name] is a 67 y.o. female  with a past medical history of atrial flutter s/p ablation 06/2023, hypertension, hyperlipidemia, hypothyroidism, obesity who presented to the ED on 10/22/2023 for shortness of breath, chest pain. Cardiology was consulted for further evaluation.   Interval history: -Patient reports she feels well overall this AM.  -Patient reports SOB when walking to bathroom. Denies chest pain or dizziness.  -BP and HR stable. -Normal stress test yesterday (11/13)  Review of systems complete and found to be negative unless listed above    Past Medical History:  Diagnosis Date   Actinic keratosis 03/29/2021   R central upper lip   Allergic rhinitis    Allergy    Anxiety    Arthritis    Atypical mole 12/26/2021   spinal upper back, exc 01/15/22   Basal cell carcinoma 10/16/2010   R lower pretibia    Bipolar 1 disorder (HCC)    Depression    Fissure, anal    GERD (gastroesophageal reflux disease)    History of basal cell carcinoma excision    ON FACE   History of squamous cell carcinoma excision    ON RIGHT LEG   Hyperlipidemia    Hypothyroidism    IBS (irritable bowel syndrome)    Liver cyst    Malignant melanoma in situ (HCC) 11/20/2022   Right labia minora. Excision 12/25/2022 at anterior vulva, left of clitoral hood, margins involved. Surgery scheduled for 02/12/2023.   Mallet finger of left hand    small finger   NSTEMI (non-ST elevated myocardial infarction) (HCC) 10/22/2023   Pre-diabetes     Past Surgical History:  Procedure Laterality Date   ABLATION     CARDIAC CATHETERIZATION  04/10/1999   per pt normal   CARDIOVASCULAR STRESS TEST   02-23-2011  dr Iantha Fallen [facility])   normal nuclear study/  no ischemia/  ef 64%   CATARACT EXTRACTION W/ INTRAOCULAR LENS  IMPLANT, BILATERAL  12/10/2001   Bil   CESAREAN SECTION  12/10/1982   CLOSED REDUCTION FINGER WITH PERCUTANEOUS PINNING Left 04/22/2014   Procedure: LEFT SMALL FINGER CLOSED REDUCTION PINNING;  Surgeon: Sharma Covert, MD;  Location: [facility];  Service: Orthopedics;  Laterality: Left;   COLONOSCOPY  11/09/2002   DILATION AND CURETTAGE OF UTERUS  1977   LAPAROSCOPIC CHOLECYSTECTOMY  03/18/2000   RECONSTRUCTION OF EYELID Bilateral 08/2022   TRANSTHORACIC ECHOCARDIOGRAM  02/23/2011   normal lvf/  ef 58%/  mild rve/   mild lae/  mild  mr  & tr/  mild pulmonary htn   VULVECTOMY N/A 12/25/2022   Procedure: PARTIAL  VULVECTOMY;  Surgeon: [name], MD;  Location: [facility];  Service: Gynecology;  Laterality: N/A;   VULVECTOMY N/A 02/12/2023   Procedure: WIDE LOCAL EXCISION OF VULVA AND REMOVAL OF CLITORIS;  Surgeon: [name], MD;  Location: [facility] [place];  Service: Gynecology;  Laterality: N/A;   WISDOM TOOTH EXTRACTION      Medications Prior to Admission  Medication Sig Dispense Refill Last Dose   apixaban (ELIQUIS) 5 MG TABS tablet Take 5 mg by mouth 2 (two) times daily.   10/22/2023 at 0800   ARIPiprazole (  ABILIFY) 10 MG tablet Take 10 mg by mouth daily.   10/21/2023 at 2200   BIOTIN PO Take 1 tablet by mouth daily.   10/21/2023 at 2200   buPROPion (WELLBUTRIN XL) 300 MG 24 hr tablet Take 300 mg by mouth daily.   10/22/2023 at 0800   cetirizine (ZYRTEC ALLERGY) 10 MG tablet Take 10 mg by mouth daily as needed for allergies.   10/22/2023 at 0800   Cholecalciferol (VITAMIN D) 2000 UNITS CAPS Take 1 capsule by mouth daily.   10/21/2023 at 2200   clonazePAM (KLONOPIN) 1 MG tablet Take 1 mg by mouth at bedtime.   10/21/2023 at 2200   famotidine (PEPCID) 20 MG tablet Take 1 tablet (20 mg total) by mouth  2 (two) times daily. 180 tablet 1 10/22/2023 at 0800   fluticasone (FLONASE) 50 MCG/ACT nasal spray Place into the nose.   10/21/2023 at 2200   hydrocortisone (ANUSOL-HC) 2.5 % rectal cream Apply 1 application. topically 2 (two) times daily. To affected area twice daily (around anus/ hemorrhoid) 30 g 0 prn at unk   lamoTRIgine (LAMICTAL) 150 MG tablet Take 1 tablet (150 mg total) by mouth daily.   10/21/2023 at 2200   levothyroxine (SYNTHROID) 50 MCG tablet TAKE 1 TABLET EVERY DAY BEFORE BREAKFAST 90 tablet 2 10/22/2023 at 0745   lisinopril (ZESTRIL) 10 MG tablet Take 10 mg by mouth daily.   10/22/2023 at 0800   lovastatin (MEVACOR) 40 MG tablet TAKE 1 TABLET EVERY DAY 90 tablet 2 10/21/2023 at 2000   nystatin (MYCOSTATIN/NYSTOP) powder Apply 1 Application topically 3 (three) times daily. Under abdomen skin fold 15 g 2 Past Week   Probiotic Product (PROBIOTIC ADVANCED PO) Take by mouth.   10/21/2023 at 1400   tiZANidine (ZANAFLEX) 4 MG tablet Take 4 mg by mouth 2 (two) times daily.   Past Week at prn   zolpidem (AMBIEN) 10 MG tablet Take 10 mg by mouth at bedtime.   10/21/2023 at 2200   cyanocobalamin (VITAMIN B12) 1000 MCG tablet Take by mouth. (Patient not taking: Reported on 10/22/2023)   Not Taking   metroNIDAZOLE (METROGEL) 1 % gel Apply topically daily. To nose, cheeks for rosacea (Patient not taking: Reported on 10/22/2023) 45 g 2 Not Taking   Social History   Socioeconomic History   Marital status: Married    Spouse name: Not on file   Number of children: 1   Years of education: Not on file   Highest education level: Bachelor's degree (e.g., BA, AB, BS)  Occupational History   Occupation: Home    Employer: RETIRED  Tobacco Use   Smoking status: Never   Smokeless tobacco: Never  Vaping Use   Vaping status: Never Used  Substance and Sexual Activity   Alcohol use: Yes    Comment: Only on cruises or rarely   Drug use: No   Sexual activity: Not Currently    Birth  control/protection: Post-menopausal  Other Topics Concern   Not on file  Social History Narrative   Moved here from [place]   Social Determinants of Health   Financial Resource Strain: Medium Risk (07/04/2023)   Overall Financial Resource Strain (CARDIA)    Difficulty of Paying Living Expenses: Somewhat hard  Food Insecurity: No Food Insecurity (10/22/2023)   Hunger Vital Sign    Worried About Running Out of Food in the Last Year: Never true    Ran Out of Food in the Last Year: Never true  Transportation Needs: No Transportation Needs (  10/22/2023)   PRAPARE - Administrator, Civil Service (Medical): No    Lack of Transportation (Non-Medical): No  Physical Activity: Sufficiently Active (07/04/2023)   Exercise Vital Sign    Days of Exercise per Week: 3 days    Minutes of Exercise per Session: 60 min  Stress: Stress Concern Present (07/04/2023)   [employer] of Occupational Health - Occupational Stress Questionnaire    Feeling of Stress : Rather much  Social Connections: Socially Integrated (07/04/2023)   Social Connection and Isolation Panel [NHANES]    Frequency of Communication with Friends and Family: More than three times a week    Frequency of Social Gatherings with Friends and Family: Once a week    Attends Religious Services: More than 4 times per year    Active Member of Clubs or Organizations: Yes    Attends Banker Meetings: More than 4 times per year    Marital Status: Married  Catering manager Violence: Not At Risk (10/22/2023)   Humiliation, Afraid, Rape, and Kick questionnaire    Fear of Current or Ex-Partner: No    Emotionally Abused: No    Physically Abused: No    Sexually Abused: No    Family History  Problem Relation Age of Onset   Stroke Mother    Heart disease Mother    Hypertension Father    Diabetes Father    Hyperlipidemia Father    Breast cancer Sister    Breast cancer Sister    Diabetes Brother    Colon cancer Neg Hx     Ovarian cancer Neg Hx    Endometrial cancer Neg Hx    Pancreatic cancer Neg Hx    Prostate cancer Neg Hx      Vitals:   10/23/23 2328 10/24/23 0445 10/24/23 0804 10/24/23 1305  BP: (!) 106/53 108/61 98/83 113/64  Pulse: (!) 54 (!) 54 (!) 54 70  Resp: 18 18 16 16   Temp: 98.2 F (36.8 C) 97.7 F (36.5 C) 97.8 F (36.6 C) 98.2 F (36.8 C)  TempSrc: Oral Oral Oral Oral  SpO2: 95% 98% 95% 97%  Weight:      Height:        PHYSICAL EXAM General: Well-appearing, well nourished, in no acute distress laying at an incline in hospital bed. HEENT: Normocephalic and atraumatic. Neck: No JVD.  Lungs: Normal respiratory effort on room air. Clear bilaterally to auscultation. No wheezes, crackles, rhonchi.  Heart: HRRR. Normal S1 and S2 without gallops or murmurs.  Abdomen: Non-distended appearing.  Msk: Normal strength and tone for age. Extremities: Warm and well perfused. No clubbing, cyanosis.  No edema.  Neuro: Alert and oriented X 3. Psych: Answers questions appropriately.   Labs: Basic Metabolic Panel: Recent Labs    10/22/23 1151 10/23/23 0133  NA 134* 134*  K 4.0 4.3  CL 101 100  CO2 24 23  GLUCOSE 111* 101*  BUN 10 11  CREATININE 1.05* 0.99  CALCIUM 8.8* 8.7*   Liver Function Tests: No results for input(s): "AST", "ALT", "ALKPHOS", "BILITOT", "PROT", "ALBUMIN" in the last 72 hours. No results for input(s): "LIPASE", "AMYLASE" in the last 72 hours. CBC: Recent Labs    10/23/23 0133 10/24/23 0723  WBC 10.3 8.1  HGB 9.8* 9.3*  HCT 30.0* 27.5*  MCV 90.6 90.8  PLT 246 249   Cardiac Enzymes: Recent Labs    10/22/23 1400 10/22/23 2219 10/23/23 0133  TROPONINIHS 105* 107* 89*   BNP: No results for input(s): "  BNP" in the last 72 hours. D-Dimer: No results for input(s): "DDIMER" in the last 72 hours. Hemoglobin A1C: No results for input(s): "HGBA1C" in the last 72 hours. Fasting Lipid Panel: Recent Labs    10/23/23 0133  CHOL 111  HDL 55  LDLCALC 43   TRIG 66  CHOLHDL 2.0   Thyroid Function Tests: Recent Labs    10/22/23 1400  TSH 1.290   Anemia Panel: No results for input(s): "VITAMINB12", "FOLATE", "FERRITIN", "TIBC", "IRON", "RETICCTPCT" in the last 72 hours.   Radiology: NM Myocar Multi W/Spect W/Wall Motion / EF  Result Date: 10/23/2023   The study is normal. The study is low risk.   No ST deviation was noted.   LV perfusion is normal. There is no evidence of ischemia. There is no evidence of infarction.   Left ventricular function is normal. End diastolic cavity size is normal. Normal Myocardial perfusion scan Normal Wall Motion Normal LVF EF=72% This is a low risk scan  DG Chest 2 View  Result Date: 10/22/2023 CLINICAL DATA:  cp EXAM: CHEST - 2 VIEW COMPARISON:  None Available. FINDINGS: No pleural effusion. No pneumothorax. Borderline cardiomegaly. There are prominent bilateral interstitial opacities could represent pulmonary venous congestion or atypical infection. No radiographically apparent displaced rib fractures. Visualized upper abdomen is unremarkable. Vertebral body heights are maintained. Surgical clips in the right upper quadrant. IMPRESSION: Prominent bilateral interstitial opacities, which could represent pulmonary venous congestion or atypical infection. Electronically Signed   By: Lorenza Cambridge M.D.   On: 10/22/2023 16:09    ECHO 06/2022: INTERPRETATION  NORMAL LEFT VENTRICULAR SYSTOLIC FUNCTION   WITH MILD LVH  NORMAL RIGHT VENTRICULAR SYSTOLIC FUNCTION  MILD VALVULAR REGURGITATION (See above)  NO VALVULAR STENOSIS  IRREGULAR HEART RHYTHM CAPTURED THROUGHOUT EXAM  ESTIMATED LVEF >55%  MILD MR, TR  MILD PHTN (RVSP: )   TELEMETRY reviewed by me 10/24/2023: sinus rhythm rate 60s/2nd degree AVB type I rate 50s  EKG reviewed by me: mobitz type I rate 59 bpm  Data reviewed by me 10/24/2023: last 24h vitals tele labs imaging I/O hospitalist progress note  Principal Problem:   Chest pain Active  Problems:   Hypothyroidism   Bipolar disorder (HCC)   Paroxysmal atrial fibrillation (HCC)   NSTEMI (non-ST elevated myocardial infarction) (HCC)    ASSESSMENT AND PLAN:  [name] [name] is a 67 y.o. female  with a past medical history of atrial flutter s/p ablation 06/2023, hypertension, hyperlipidemia, hypothyroidism, obesity who presented to the ED on 10/22/2023 for shortness of breath, chest pain. Cardiology was consulted for further evaluation.   # Chest pain # Elevated troponin Patient presented with 1 episode of central chest pain which began on evening of 11/12 when laying down for bed.  Patient went to sleep and it was gone by the morning.  Denies any recurrence.  Troponins upon admission 116 > 105.  -Nuclear stress test yesterday (11/13) normal. No additional cardiac diagnostics at this time.  -DC heparin -S/p ASA 325 in the ED. Continue aspirin 81 mg daily.  # 2nd degree AVB type I # Atrial flutter s/p ablation 06/2023 Patient with hx of atrial flutter, underwent ablation 06/2023. Presenting with mobitz type I on EKG and tele, HR stable in the 50-60s.  -Continue to monitor HR.  Ok for discharge today from a cardiac perspective. Will arrange for follow up in clinic with Dr. Juliann Pares in 1-2 weeks.    This patient's plan of care was discussed and created with Dr. Corky Sing  and he is in agreement.  Signed: Gale Journey, PA-C  10/24/2023, 1:26 PM [facility]

## 2023-10-24 NOTE — Discharge Summary (Signed)
Physician Discharge Summary   Patient: [name] [name] MRN: [id number]  DOB: 1956-05-11   Admit:     Date of Admission: 10/22/2023 Admitted from: home   Discharge: Date of discharge: 10/24/23 Disposition: Home Condition at discharge: good  CODE STATUS: FULL CODE     Discharge Physician: Sunnie Nielsen, DO [facility]     PCP: Judy Pimple, MD  Recommendations for Outpatient Follow-up:  Follow up with PCP Tower, Audrie Gallus, MD in 2-4 weeks Folow up with cardiology in 1 week   Discharge Instructions     Diet - low sodium heart healthy   Complete by: As directed    Increase activity slowly   Complete by: As directed          Discharge Diagnoses: Principal Problem:   Chest pain Active Problems:   NSTEMI (non-ST elevated myocardial infarction) (HCC)   Hypothyroidism   Bipolar disorder (HCC)   Paroxysmal atrial fibrillation [facility])       Hospital Course: HPI:  [name] [name] is a 67 y.o. female with medical history significant of obesity, GERD, hypothyroidism, hyperlipidemia, atrial fibrillation status post ablation July 2024 presenting with chest pain x1 week, at rest, some nausea.   Hospital course / significant events:  11/12: admitted w/ NSTEMI. Heparin gtt 11/13: results NM stress test pending   11/14: no concerns on stress test, cardiology clears for d/c w/ outpatient f/u  Consultants:  Cardiology   Procedures/Surgeries: none      ASSESSMENT & PLAN:   Chest pain Elevated troponin Continue ASA, restart Eliquis on discharge Cardiology following outpatient   2nd degree AVB type I Atrial flutter s/p ablation 06/2023 Patient with hx of atrial flutter, underwent ablation 06/2023. Now presenting with mobitz type I on EKG and tele, HR stable in the 50-60s.  Continue to monitor HR Restart eliquis   Hypothyroidism Continue Synthroid  GERD Continue home famotidine  Insomnia Long time use of Ambien 10 mg at bedtime and  clonazepam 1 mg at bedtime  dependence discussed follow outpatient   Vaginal irritation d/t use medicated wipe Resolved   Febrile illness POA Suspect false negative COVID testing vs other viral process Follow w/ PCP / seek emergency care if not improving    obesity based on BMI: Body mass index is 40.52 kg/m.  Underweight - under 18.5  normal weight - 18.5 to 24.9 overweight - 25 to 29.9 obese - 30 or more             Discharge Instructions  Allergies as of 10/24/2023       Reactions   Ingrezza [valbenazine Tosylate] Other (See Comments)   Night sweats, worsened tardive dyskinesia, nightmares   Lidocaine Swelling   REACTION: swelling ALL CAINE DRUGS EXCEPT: marcaine & sensercaine Other reaction(s): Edema   Xylocaine [lidocaine Hcl] Swelling   ALLERGIC TO ALL CAINES EXCEPT SENSORCAINE , AND MARCAINE   Amantadine    Austedo Xr [deutetrabenazine]    Caused Muscle aches and tremors   Codeine Nausea And Vomiting   REACTION: vomits blood   Lithium Swelling   Tegretol [carbamazepine] Other (See Comments)   REACTION: skin crawls        Medication List     STOP taking these medications    cyanocobalamin 1000 MCG tablet Commonly known as: VITAMIN B12   metroNIDAZOLE 1 % gel Commonly known as: METROGEL       TAKE these medications    acetaminophen 325 MG tablet Commonly known as: TYLENOL  Take 2 tablets (650 mg total) by mouth every 4 (four) hours as needed for headache or mild pain (pain score 1-3).   ARIPiprazole 10 MG tablet Commonly known as: ABILIFY Take 10 mg by mouth daily.   aspirin EC 81 MG tablet Take 1 tablet (81 mg total) by mouth daily. Swallow whole. Start taking on: October 25, 2023   BIOTIN PO Take 1 tablet by mouth daily.   buPROPion 300 MG 24 hr tablet Commonly known as: WELLBUTRIN XL Take 300 mg by mouth daily.   Eliquis 5 MG Tabs tablet Generic drug: apixaban Take 5 mg by mouth 2 (two) times daily.   famotidine 20  MG tablet Commonly known as: PEPCID Take 1 tablet (20 mg total) by mouth 2 (two) times daily.   fluticasone 50 MCG/ACT nasal spray Commonly known as: FLONASE Place into the nose.   hydrocortisone 2.5 % rectal cream Commonly known as: Anusol-HC Apply 1 application. topically 2 (two) times daily. To affected area twice daily (around anus/ hemorrhoid)   KlonoPIN 1 MG tablet Generic drug: clonazePAM Take 1 mg by mouth at bedtime.   lamoTRIgine 150 MG tablet Commonly known as: LaMICtal Take 1 tablet (150 mg total) by mouth daily.   levothyroxine 50 MCG tablet Commonly known as: SYNTHROID TAKE 1 TABLET EVERY DAY BEFORE BREAKFAST   lisinopril 2.5 MG tablet Commonly known as: ZESTRIL Take 1 tablet (2.5 mg total) by mouth daily. Start taking on: October 25, 2023 What changed:  medication strength how much to take   lovastatin 40 MG tablet Commonly known as: MEVACOR TAKE 1 TABLET EVERY DAY   nystatin powder Commonly known as: MYCOSTATIN/NYSTOP Apply 1 Application topically 3 (three) times daily. Under abdomen skin fold   PROBIOTIC ADVANCED PO Take by mouth.   tiZANidine 4 MG tablet Commonly known as: ZANAFLEX Take 4 mg by mouth 2 (two) times daily.   Vitamin D 50 MCG (2000 UT) Caps Take 1 capsule by mouth daily.   zolpidem 10 MG tablet Commonly known as: AMBIEN Take 10 mg by mouth at bedtime.   ZyrTEC Allergy 10 MG tablet Generic drug: cetirizine Take 10 mg by mouth daily as needed for allergies.         Follow-up Information     Alwyn Pea, MD. Go in 1 week(s).   Specialties: Cardiology, Internal Medicine Why: appointment for 11/01/23 9.15am Contact information: [address] [place] [address] [phone]                 Allergies  Allergen Reactions   Ingrezza [Valbenazine Tosylate] Other (See Comments)    Night sweats, worsened tardive dyskinesia, nightmares   Lidocaine Swelling    REACTION: swelling ALL CAINE DRUGS  EXCEPT: marcaine & sensercaine Other reaction(s): Edema   Xylocaine [Lidocaine Hcl] Swelling    ALLERGIC TO ALL CAINES EXCEPT SENSORCAINE , AND MARCAINE   Amantadine    Austedo Xr [Deutetrabenazine]     Caused Muscle aches and tremors   Codeine Nausea And Vomiting    REACTION: vomits blood   Lithium Swelling   Tegretol [Carbamazepine] Other (See Comments)    REACTION: skin crawls     Subjective: pt reports feeling okay today, still some body aches and concern for elevated temp but no cough, no urinary frequency/burning, no diarrhea/ abd pain.    Discharge Exam: BP 113/64 (BP Location: Right Arm)   Pulse 70   Temp 98.2 F (36.8 C) (Oral)   Resp 16   Ht 5'  1.5" (1.562 m)   Wt 98.9 kg   SpO2 97%   BMI 40.52 kg/m  General: Pt is alert, awake, not in acute distress Cardiovascular: RRR, S1/S2 +, no rubs, no gallops Respiratory: CTA bilaterally, no wheezing, no rhonchi Abdominal: Soft, NT, ND, bowel sounds + Extremities: no edema, no cyanosis     The results of significant diagnostics from this hospitalization (including imaging, microbiology, ancillary and laboratory) are listed below for reference.     Microbiology: Recent Results (from the past 240 hour(s))  Resp panel by RT-PCR (RSV, Flu A&B, Covid) Anterior Nasal Swab     Status: None   Collection Time: 10/22/23 11:57 AM   Specimen: Anterior Nasal Swab  Result Value Ref Range Status   SARS Coronavirus 2 by RT PCR NEGATIVE NEGATIVE Final    Comment: (NOTE) SARS-CoV-2 target nucleic acids are NOT DETECTED.  The SARS-CoV-2 RNA is generally detectable in upper respiratory specimens during the acute phase of infection. The lowest concentration of SARS-CoV-2 viral copies this assay can detect is 138 copies/mL. A negative result does not preclude SARS-Cov-2 infection and should not be used as the sole basis for treatment or other patient management decisions. A negative result may occur with  improper specimen  collection/handling, submission of specimen other than nasopharyngeal swab, presence of viral mutation(s) within the areas targeted by this assay, and inadequate number of viral copies(<138 copies/mL). A negative result must be combined with clinical observations, patient history, and epidemiological information. The expected result is Negative.  Fact Sheet for Patients:  BloggerCourse.com  Fact Sheet for Healthcare Providers:  SeriousBroker.it  This test is no t yet approved or cleared by the [place] FDA and  has been authorized for detection and/or diagnosis of SARS-CoV-2 by FDA under an Emergency Use Authorization (EUA). This EUA will remain  in effect (meaning this test can be used) for the duration of the COVID-19 declaration under Section 564(b)(1) of the Act, 21 U.S.C.section 360bbb-3(b)(1), unless the authorization is terminated  or revoked sooner.       Influenza A by PCR NEGATIVE NEGATIVE Final   Influenza B by PCR NEGATIVE NEGATIVE Final    Comment: (NOTE) The Xpert Xpress SARS-CoV-2/FLU/RSV plus assay is intended as an aid in the diagnosis of influenza from Nasopharyngeal swab specimens and should not be used as a sole basis for treatment. Nasal washings and aspirates are unacceptable for Xpert Xpress SARS-CoV-2/FLU/RSV testing.  Fact Sheet for Patients: BloggerCourse.com  Fact Sheet for Healthcare Providers: SeriousBroker.it  This test is not yet approved or cleared by the [place] FDA and has been authorized for detection and/or diagnosis of SARS-CoV-2 by FDA under an Emergency Use Authorization (EUA). This EUA will remain in effect (meaning this test can be used) for the duration of the COVID-19 declaration under Section 564(b)(1) of the Act, 21 U.S.C. section 360bbb-3(b)(1), unless the authorization is terminated or revoked.     Resp Syncytial  Virus by PCR NEGATIVE NEGATIVE Final    Comment: (NOTE) Fact Sheet for Patients: BloggerCourse.com  Fact Sheet for Healthcare Providers: SeriousBroker.it  This test is not yet approved or cleared by the [place] FDA and has been authorized for detection and/or diagnosis of SARS-CoV-2 by FDA under an Emergency Use Authorization (EUA). This EUA will remain in effect (meaning this test can be used) for the duration of the COVID-19 declaration under Section 564(b)(1) of the Act, 21 U.S.C. section 360bbb-3(b)(1), unless the authorization is terminated or revoked.  Performed at [facility],  [address], [place], [place] [address]      Labs: BNP (last 3 results) No results for input(s): "BNP" in the last 8760 hours. Basic Metabolic Panel: Recent Labs  Lab 10/22/23 1151 10/23/23 0133  NA 134* 134*  K 4.0 4.3  CL 101 100  CO2 24 23  GLUCOSE 111* 101*  BUN 10 11  CREATININE 1.05* 0.99  CALCIUM 8.8* 8.7*   Liver Function Tests: No results for input(s): "AST", "ALT", "ALKPHOS", "BILITOT", "PROT", "ALBUMIN" in the last 168 hours. No results for input(s): "LIPASE", "AMYLASE" in the last 168 hours. No results for input(s): "AMMONIA" in the last 168 hours. CBC: Recent Labs  Lab 10/22/23 1151 10/23/23 0133 10/24/23 0723  WBC 10.3 10.3 8.1  HGB 10.3* 9.8* 9.3*  HCT 31.2* 30.0* 27.5*  MCV 91.8 90.6 90.8  PLT 248 246 249   Cardiac Enzymes: No results for input(s): "CKTOTAL", "CKMB", "CKMBINDEX", "TROPONINI" in the last 168 hours. BNP: Invalid input(s): "POCBNP" CBG: No results for input(s): "GLUCAP" in the last 168 hours. D-Dimer No results for input(s): "DDIMER" in the last 72 hours. Hgb A1c No results for input(s): "HGBA1C" in the last 72 hours. Lipid Profile Recent Labs    10/23/23 0133  CHOL 111  HDL 55  LDLCALC 43  TRIG 66  CHOLHDL 2.0   Thyroid function studies Recent Labs     10/22/23 1400  TSH 1.290   Anemia work up No results for input(s): "VITAMINB12", "FOLATE", "FERRITIN", "TIBC", "IRON", "RETICCTPCT" in the last 72 hours. Urinalysis    Component Value Date/Time   COLORURINE YELLOW (A) 10/22/2023 1445   APPEARANCEUR CLEAR (A) 10/22/2023 1445   LABSPEC 1.006 10/22/2023 1445   PHURINE 6.0 10/22/2023 1445   GLUCOSEU NEGATIVE 10/22/2023 1445   HGBUR NEGATIVE 10/22/2023 1445   HGBUR large 07/29/2008 1001   BILIRUBINUR NEGATIVE 10/22/2023 1445   BILIRUBINUR Negeative 05/30/2023 1215   KETONESUR NEGATIVE 10/22/2023 1445   PROTEINUR NEGATIVE 10/22/2023 1445   UROBILINOGEN 0.2 05/30/2023 1215   UROBILINOGEN 0.2 07/29/2008 1001   NITRITE NEGATIVE 10/22/2023 1445   LEUKOCYTESUR NEGATIVE 10/22/2023 1445   Sepsis Labs Recent Labs  Lab 10/22/23 1151 10/23/23 0133 10/24/23 0723  WBC 10.3 10.3 8.1   Microbiology Recent Results (from the past 240 hour(s))  Resp panel by RT-PCR (RSV, Flu A&B, Covid) Anterior Nasal Swab     Status: None   Collection Time: 10/22/23 11:57 AM   Specimen: Anterior Nasal Swab  Result Value Ref Range Status   SARS Coronavirus 2 by RT PCR NEGATIVE NEGATIVE Final    Comment: (NOTE) SARS-CoV-2 target nucleic acids are NOT DETECTED.  The SARS-CoV-2 RNA is generally detectable in upper respiratory specimens during the acute phase of infection. The lowest concentration of SARS-CoV-2 viral copies this assay can detect is 138 copies/mL. A negative result does not preclude SARS-Cov-2 infection and should not be used as the sole basis for treatment or other patient management decisions. A negative result may occur with  improper specimen collection/handling, submission of specimen other than nasopharyngeal swab, presence of viral mutation(s) within the areas targeted by this assay, and inadequate number of viral copies(<138 copies/mL). A negative result must be combined with clinical observations, patient history, and  epidemiological information. The expected result is Negative.  Fact Sheet for Patients:  BloggerCourse.com  Fact Sheet for Healthcare Providers:  SeriousBroker.it  This test is no t yet approved or cleared by the [place] FDA and  has been authorized for detection and/or diagnosis of  SARS-CoV-2 by FDA under an Emergency Use Authorization (EUA). This EUA will remain  in effect (meaning this test can be used) for the duration of the COVID-19 declaration under Section 564(b)(1) of the Act, 21 U.S.C.section 360bbb-3(b)(1), unless the authorization is terminated  or revoked sooner.       Influenza A by PCR NEGATIVE NEGATIVE Final   Influenza B by PCR NEGATIVE NEGATIVE Final    Comment: (NOTE) The Xpert Xpress SARS-CoV-2/FLU/RSV plus assay is intended as an aid in the diagnosis of influenza from Nasopharyngeal swab specimens and should not be used as a sole basis for treatment. Nasal washings and aspirates are unacceptable for Xpert Xpress SARS-CoV-2/FLU/RSV testing.  Fact Sheet for Patients: BloggerCourse.com  Fact Sheet for Healthcare Providers: SeriousBroker.it  This test is not yet approved or cleared by the [place] FDA and has been authorized for detection and/or diagnosis of SARS-CoV-2 by FDA under an Emergency Use Authorization (EUA). This EUA will remain in effect (meaning this test can be used) for the duration of the COVID-19 declaration under Section 564(b)(1) of the Act, 21 U.S.C. section 360bbb-3(b)(1), unless the authorization is terminated or revoked.     Resp Syncytial Virus by PCR NEGATIVE NEGATIVE Final    Comment: (NOTE) Fact Sheet for Patients: BloggerCourse.com  Fact Sheet for Healthcare Providers: SeriousBroker.it  This test is not yet approved or cleared by the [place] FDA and has been  authorized for detection and/or diagnosis of SARS-CoV-2 by FDA under an Emergency Use Authorization (EUA). This EUA will remain in effect (meaning this test can be used) for the duration of the COVID-19 declaration under Section 564(b)(1) of the Act, 21 U.S.C. section 360bbb-3(b)(1), unless the authorization is terminated or revoked.  Performed at [facility], [address], [place], [place] [address]    Imaging NM Myocar Multi W/Spect Izetta Dakin Motion / EF  Result Date: 10/23/2023   The study is normal. The study is low risk.   No ST deviation was noted.   LV perfusion is normal. There is no evidence of ischemia. There is no evidence of infarction.   Left ventricular function is normal. End diastolic cavity size is normal. Normal Myocardial perfusion scan Normal Wall Motion Normal LVF EF=72% This is a low risk scan  DG Chest 2 View  Result Date: 10/22/2023 CLINICAL DATA:  cp EXAM: CHEST - 2 VIEW COMPARISON:  None Available. FINDINGS: No pleural effusion. No pneumothorax. Borderline cardiomegaly. There are prominent bilateral interstitial opacities could represent pulmonary venous congestion or atypical infection. No radiographically apparent displaced rib fractures. Visualized upper abdomen is unremarkable. Vertebral body heights are maintained. Surgical clips in the right upper quadrant. IMPRESSION: Prominent bilateral interstitial opacities, which could represent pulmonary venous congestion or atypical infection. Electronically Signed   By: Lorenza Cambridge M.D.   On: 10/22/2023 16:09      Time coordinating discharge: over 30 minutes  SIGNED:  Sunnie Nielsen DO [facility]

## 2023-10-25 ENCOUNTER — Telehealth: Payer: Self-pay

## 2023-10-25 NOTE — Transitions of Care (Post Inpatient/ED Visit) (Signed)
10/25/2023  Name: [name] [name] MRN: [id number] DOB: Aug 06, 1956  Today's TOC FU Call Status: Today's TOC FU Call Status:: Successful TOC FU Call Completed TOC FU Call Complete Date: 10/25/23 Patient's Name and Date of Birth confirmed.  Transition Care Management Follow-up Telephone Call Date of Discharge: 10/24/23 Discharge Facility: [facility]) Type of Discharge: Inpatient Admission Primary Inpatient Discharge Diagnosis:: NSTEMI How have you been since you were released from the hospital?: Better Any questions or concerns?: No  Items Reviewed: Did you receive and understand the discharge instructions provided?: Yes Medications obtained,verified, and reconciled?: Yes (Medications Reviewed) Any new allergies since your discharge?: No Dietary orders reviewed?: Yes Type of Diet Ordered:: Cardiac Do you have support at home?: Yes People in Home: spouse Name of Support/Comfort Primary Source: [name]  Medications Reviewed Today: Medications Reviewed Today     Reviewed by [name], RN (Case Manager) on 10/25/23 at 1043  Med List Status: <None>   Medication Order Taking? Sig Documenting Provider Last Dose Status Informant  acetaminophen (TYLENOL) 325 MG tablet [id number]  Take 2 tablets (650 mg total) by mouth every 4 (four) hours as needed for headache or mild pain (pain score 1-3). Sunnie Nielsen, DO  Active   apixaban (ELIQUIS) 5 MG TABS tablet [id number] No Take 5 mg by mouth 2 (two) times daily. [provider] 10/22/2023 0800 Active Self  ARIPiprazole (ABILIFY) 10 MG tablet [id number] No Take 10 mg by mouth daily. [provider] 10/21/2023 2200 Active Self  aspirin EC 81 MG tablet [id number]  Take 1 tablet (81 mg total) by mouth daily. Swallow whole. Sunnie Nielsen, DO  Active   BIOTIN PO [id number] No Take 1 tablet by mouth daily. [provider] 10/21/2023 2200 Active Self  buPROPion (WELLBUTRIN  XL) 300 MG 24 hr tablet [id number] No Take 300 mg by mouth daily. [provider] 10/22/2023 0800 Active Self  cetirizine (ZYRTEC ALLERGY) 10 MG tablet [id number] No Take 10 mg by mouth daily as needed for allergies. [provider] 10/22/2023 0800 Active Self  Cholecalciferol (VITAMIN D) 2000 UNITS CAPS [id number] No Take 1 capsule by mouth daily. [provider] 10/21/2023 2200 Active Self  clonazePAM (KLONOPIN) 1 MG tablet [id number] No Take 1 mg by mouth at bedtime. [provider] 10/21/2023 2200 Active Self           Med Note ([place], KIMBERLY B   Thu Feb 28, 2023  3:33 PM)    famotidine (PEPCID) 20 MG tablet [id number] No Take 1 tablet (20 mg total) by mouth 2 (two) times daily. Tower, Audrie Gallus, MD 10/22/2023 0800 Active Self  fluticasone (FLONASE) 50 MCG/ACT nasal spray [id number] No Place into the nose. [provider] 10/21/2023 2200 Active Self  hydrocortisone (ANUSOL-HC) 2.5 % rectal cream [id number] No Apply 1 application. topically 2 (two) times daily. To affected area twice daily (around anus/ hemorrhoid) Tower, Audrie Gallus, MD prn unk Active Self           Med Note Nelia Shi   Tue Oct 22, 2023  1:56 PM)    lamoTRIgine (LAMICTAL) 150 MG tablet [id number] No Take 1 tablet (150 mg total) by mouth daily. Joaquim Nam, MD 10/21/2023 2200 Active Self  levothyroxine (SYNTHROID) 50 MCG tablet [id number] No TAKE 1 TABLET EVERY DAY BEFORE BREAKFAST Tower, Audrie Gallus, MD 10/22/2023 0745 Active Self  lisinopril (ZESTRIL) 2.5 MG tablet [id number]  Take 1 tablet (2.5 mg total) by mouth daily. Sunnie Nielsen, DO  Active   lovastatin (MEVACOR) 40 MG tablet [id number] No TAKE 1 TABLET EVERY DAY Tower, Audrie Gallus, MD 10/21/2023 2000 Active Self  nystatin (MYCOSTATIN/NYSTOP) powder [id number] No Apply 1 Application topically 3 (three) times daily. Under abdomen skin fold Doylene Bode, NP Past Week Active Self           Med Note Harlow Asa Oct 22, 2023  1:56 PM)    Probiotic Product (PROBIOTIC ADVANCED PO) [id number] No Take by mouth. [provider] 10/21/2023 1400 Active Self  tiZANidine (ZANAFLEX) 4 MG tablet [id number] No Take 4 mg by mouth every 6 (six) hours as needed. [provider] Past Week prn Active Self  zolpidem (AMBIEN) 10 MG tablet [id number] No Take 10 mg by mouth at bedtime. [provider] 10/21/2023 2200 Active Self           Med Note Para March, Toni Amend Mar 20, 2021 11:57 AM)              Home Care and Equipment/Supplies: Were Home Health Services Ordered?: NA Any new equipment or medical supplies ordered?: NA  Functional Questionnaire: Do you need assistance with bathing/showering or dressing?: No Do you need assistance with meal preparation?: No Do you need assistance with eating?: No Do you have difficulty maintaining continence: No Do you need assistance with getting out of bed/getting out of a chair/moving?: No Do you have difficulty managing or taking your medications?: No  Follow up appointments reviewed: PCP Follow-up appointment confirmed?: Yes Date of PCP follow-up appointment?: 10/29/23 Follow-up Provider: [facility] Follow-up appointment confirmed?: No Reason Specialist Follow-Up Not Confirmed: Patient has Specialist Provider Number and will Call for Appointment Do you need transportation to your follow-up appointment?: No Do you understand care options if your condition(s) worsen?: Yes-patient verbalized understanding  SDOH Interventions Today    Flowsheet Row Most Recent Value  SDOH Interventions   Food Insecurity Interventions Intervention Not Indicated  Housing Interventions Intervention Not Indicated  Transportation Interventions Intervention Not Indicated  Utilities Interventions Intervention Not Indicated      Contacted the patient today. Reviewed discharge summary and medications. Educated the patient on her diet. Scheduled  PCP hospital follow up appointment. Educated the patient regarding her [employer] Medicare Benefits and to call the member services line for questions. The patient was reminded to call the provider of [facility] for questions or concerns. No Home Health or Outpatient therapy needed.    Deidre Ala, RN Medical illustrator [facility] [phone]

## 2023-10-29 ENCOUNTER — Ambulatory Visit (INDEPENDENT_AMBULATORY_CARE_PROVIDER_SITE_OTHER)
Admission: RE | Admit: 2023-10-29 | Discharge: 2023-10-29 | Disposition: A | Discharge status: Home / Self Care | Payer: Medicare HMO | Source: Ambulatory Visit | Attending: Family Medicine | Admitting: Family Medicine

## 2023-10-29 ENCOUNTER — Encounter: Payer: Self-pay | Admitting: Family Medicine

## 2023-10-29 ENCOUNTER — Ambulatory Visit (INDEPENDENT_AMBULATORY_CARE_PROVIDER_SITE_OTHER): Payer: Medicare HMO | Admitting: Family Medicine

## 2023-10-29 VITALS — BP 131/70 | HR 51 | Temp 98.2°F | Ht 61.5 in | Wt 223.2 lb

## 2023-10-29 DIAGNOSIS — R079 Chest pain, unspecified: Secondary | ICD-10-CM | POA: Diagnosis not present

## 2023-10-29 DIAGNOSIS — Z6841 Body Mass Index (BMI) 40.0 and over, adult: Secondary | ICD-10-CM

## 2023-10-29 DIAGNOSIS — R0600 Dyspnea, unspecified: Secondary | ICD-10-CM

## 2023-10-29 DIAGNOSIS — D649 Anemia, unspecified: Secondary | ICD-10-CM

## 2023-10-29 DIAGNOSIS — R0609 Other forms of dyspnea: Secondary | ICD-10-CM | POA: Diagnosis not present

## 2023-10-29 DIAGNOSIS — I214 Non-ST elevation (NSTEMI) myocardial infarction: Secondary | ICD-10-CM | POA: Diagnosis not present

## 2023-10-29 DIAGNOSIS — I480 Paroxysmal atrial fibrillation: Secondary | ICD-10-CM | POA: Diagnosis not present

## 2023-10-29 DIAGNOSIS — R918 Other nonspecific abnormal finding of lung field: Secondary | ICD-10-CM | POA: Diagnosis not present

## 2023-10-29 DIAGNOSIS — R944 Abnormal results of kidney function studies: Secondary | ICD-10-CM

## 2023-10-29 DIAGNOSIS — R9389 Abnormal findings on diagnostic imaging of other specified body structures: Secondary | ICD-10-CM

## 2023-10-29 HISTORY — DX: Anemia, unspecified: D64.9

## 2023-10-29 LAB — FERRITIN: Ferritin: 182.8 ng/mL (ref 10.0–291.0)

## 2023-10-29 LAB — BASIC METABOLIC PANEL
BUN: 13 mg/dL (ref 6–23)
CO2: 26 meq/L (ref 19–32)
Calcium: 9.4 mg/dL (ref 8.4–10.5)
Chloride: 104 meq/L (ref 96–112)
Creatinine, Ser: 1.07 mg/dL (ref 0.40–1.20)
GFR: 54.46 mL/min — ABNORMAL LOW (ref >60.00)
Glucose, Bld: 91 mg/dL (ref 70–99)
Potassium: 5 meq/L (ref 3.5–5.1)
Sodium: 139 meq/L (ref 135–145)

## 2023-10-29 LAB — IRON: Iron: 45 ug/dL (ref 42–145)

## 2023-10-29 LAB — VITAMIN B12: Vitamin B-12: 400 pg/mL (ref 211–911)

## 2023-10-29 NOTE — Progress Notes (Signed)
Subjective:    Patient ID: [name] [name], female    DOB: 08-10-1956, 67 y.o.   MRN: [id number]  HPI  Wt Readings from Last 3 Encounters:  10/29/23 223 lb 4 oz (101.3 kg)  10/22/23 218 lb (98.9 kg)  10/22/23 224 lb 3.2 oz (101.7 kg)   41.50 kg/m  Vitals:   10/29/23 0945 10/29/23 1016  BP: 128/76 131/70  Pulse: (!) 51   Temp: 98.2 F (36.8 C)   SpO2: 99%    Pt presents for follow up from hospitalization from 11/12 to 11/14 for chest pain  NSTEMi   Hosp course from disch summary   This all started after she started a crazy diet pill from facebook  Chromium  Green tea  Coffee      Hospital course / significant events:  11/12: admitted w/ NSTEMI. Heparin gtt 11/13: results NM stress test pending   11/14: no concerns on stress test, cardiology clears for d/c w/ outpatient f/u   Consultants:  Cardiology    Procedures/Surgeries: none      ASSESSMENT & PLAN:   Chest pain Elevated troponin Continue ASA, restart Eliquis on discharge Cardiology following outpatient    2nd degree AVB type I Atrial flutter s/p ablation 06/2023 Patient with hx of atrial flutter, underwent ablation 06/2023. Now presenting with mobitz type I on EKG and tele, HR stable in the 50-60s.  Continue to monitor HR Restart eliquis   Hypothyroidism Continue Synthroid   GERD Continue home famotidine   Insomnia Long time use of Ambien 10 mg at bedtime and clonazepam 1 mg at bedtime  dependence discussed follow outpatient    Vaginal irritation d/t use medicated wipe Resolved    Febrile illness POA Suspect false negative COVID testing vs other viral process Follow w/ PCP / seek emergency care if not improving      obesity based on BMI: Body mass index is 40.52 kg/m.  Underweight - under 18.5  normal weight - 18.5 to 24.9 overweight - 25 to 29.9 obese - 30 or more  Per pt very out of breath with almost any exertion  No chest pain   Cxr EXAM:  CHEST - 2 VIEW   COMPARISON:   None Available.   FINDINGS:  No pleural effusion. No pneumothorax. Borderline cardiomegaly. There  are prominent bilateral interstitial opacities could represent  pulmonary venous congestion or atypical infection. No  radiographically apparent displaced rib fractures. Visualized upper  abdomen is unremarkable. Vertebral body heights are maintained.  Surgical clips in the right upper quadrant.   IMPRESSION:  Prominent bilateral interstitial opacities, which could represent  pulmonary venous congestion or atypical infection.   Neg covid  (? Of false neg)  Neg flu  Neg RSV  Dry cough  No fever  A little light headed     Stress test 11/13 Narrative & Impression      The study is normal. The study is low risk.   No ST deviation was noted.   LV perfusion is normal. There is no evidence of ischemia. There is no evidence of infarction.   Left ventricular function is normal. End diastolic cavity size is normal.   Normal Myocardial perfusion scan Normal Wall Motion Normal LVF EF=72% This is a low risk scan   Lab Results  Component Value Date   WBC 8.1 10/24/2023   HGB 9.3 (L) 10/24/2023   HCT 27.5 (L) 10/24/2023   MCV 90.8 10/24/2023   PLT 249 10/24/2023   Anemia was new  Lab Results  Component Value Date   NA 139 10/29/2023   K 5.0 10/29/2023   CO2 26 10/29/2023   GLUCOSE 91 10/29/2023   BUN 13 10/29/2023   CREATININE 1.07 10/29/2023   CALCIUM 9.4 10/29/2023   GFR 54.46 (L) 10/29/2023   GFRNONAA >60 10/23/2023  GFR over 60    Lab Results  Component Value Date   ALT 16 06/29/2023   AST 25 06/29/2023   ALKPHOS 69 06/29/2023   BILITOT 0.6 06/29/2023    Lab Results  Component Value Date   TSH 1.290 10/22/2023       Cardiology follow up is planned on 11/22 with Dr Juliann Pares   BP Readings from Last 3 Encounters:  10/29/23 131/70  10/24/23 113/64  10/22/23 116/80   Pulse Readings from Last 3 Encounters:  10/29/23 (!) 51  10/24/23 70  10/22/23 70    Lisinopril 2.5 mg daily  Eliuis 5 mg bid      Mood in setting of bipolar dz      10/29/2023    9:57 AM 09/09/2023   11:31 AM 09/03/2023    9:45 AM 07/05/2023   12:25 PM 07/02/2023    8:12 AM  Depression screen PHQ 2/9  Decreased Interest 2 0 1 2 1   Down, Depressed, Hopeless 2 0 1 2 0  PHQ - 2 Score 4 0 2 4 1   Altered sleeping 0 0 0 0 0  Tired, decreased energy 3 1 0 2 0  Change in appetite 0 0 0 0 0  Feeling bad or failure about yourself  1 1 1 1  0  Trouble concentrating 1 1 0 1 0  Moving slowly or fidgety/restless 3 0 0 1 0  Suicidal thoughts 0 0  0 0  PHQ-9 Score 12 3 3 9 1   Difficult doing work/chores Somewhat difficult Not difficult at all Somewhat difficult Not difficult at all Not difficult at all      10/29/2023    9:57 AM 09/09/2023   11:31 AM 09/03/2023    9:45 AM 07/05/2023   12:25 PM  GAD 7 : Generalized Anxiety Score  Nervous, Anxious, on Edge 0 0 0 3  Control/stop worrying 3 1 1 3   Worry too much - different things 1 2 1 3   Trouble relaxing 1 1 1 2   Restless 0 0 0 1  Easily annoyed or irritable 1 1 1 2   Afraid - awful might happen 1 2 1 3   Total GAD 7 Score 7 7 5 17   Anxiety Difficulty Somewhat difficult Somewhat difficult Somewhat difficult Somewhat difficult   Has appointment with counselor on 12/4      Patient Active Problem List   Diagnosis Date Noted   Anemia 10/29/2023   Abnormal chest x-ray 10/29/2023   Chest pain 10/22/2023   NSTEMI (non-ST elevated myocardial infarction) (HCC) 10/22/2023   Pain of left hip 09/11/2023   History of anal fissures 09/09/2023   Acute left ankle pain 08/09/2023   Osteopenia 07/30/2023   Headache 07/05/2023   Paroxysmal atrial fibrillation (HCC) 06/27/2023   Bradycardia 05/30/2023   Melanoma in situ (HCC) 12/25/2022   ASCUS of cervix with negative high risk HPV 06/25/2022   Osteoarthritis of knee 05/25/2022   Hemorrhoid 04/25/2022   Estrogen deficiency 01/02/2022   Bipolar disorder, in partial  remission, most recent episode mixed (HCC) 02/25/2020   Dyspnea 01/19/2020   Elevated glucose level 03/03/2018   Atrophic vaginitis 07/08/2017   Cervical stenosis (uterine cervix) 01/03/2017  Hearing loss 10/25/2015   Routine general medical examination at a health care facility 10/17/2015   Decreased GFR 10/22/2014   Colon cancer screening 10/16/2013   Enthesopathy of ankle and tarsus 12/01/2009   Enthesopathy of ankle and tarsus 12/01/2009   Asymptomatic postmenopausal status 09/30/2009   Arthropathy of pelvic region and thigh 03/10/2009   DEGENERATIVE DISC DISEASE, LUMBAR SPINE 03/10/2009   DDD (degenerative disc disease), lumbosacral 03/10/2009   ONYCHOMYCOSIS 09/17/2008   Hypothyroidism 08/05/2007   Morbid obesity (HCC) 08/05/2007   Bipolar disorder (HCC) 08/05/2007   Allergic rhinitis 08/05/2007   IBS 08/05/2007   GERD (gastroesophageal reflux disease) 08/05/2007   HYPERCHOLESTEROLEMIA, PURE 07/07/2007   Past Medical History:  Diagnosis Date   Actinic keratosis 03/29/2021   R central upper lip   Allergic rhinitis    Allergy    Anemia 10/29/2023   Anxiety    Arthritis    Atypical mole 12/26/2021   spinal upper back, exc 01/15/22   Basal cell carcinoma 10/16/2010   R lower pretibia    Bipolar 1 disorder (HCC)    Depression    Fissure, anal    GERD (gastroesophageal reflux disease)    History of basal cell carcinoma excision    ON FACE   History of squamous cell carcinoma excision    ON RIGHT LEG   Hyperlipidemia    Hypothyroidism    IBS (irritable bowel syndrome)    Liver cyst    Malignant melanoma in situ (HCC) 11/20/2022   Right labia minora. Excision 12/25/2022 at anterior vulva, left of clitoral hood, margins involved. Surgery scheduled for 02/12/2023.   Mallet finger of left hand    small finger   NSTEMI (non-ST elevated myocardial infarction) (HCC) 10/22/2023   Pre-diabetes    Past Surgical History:  Procedure Laterality Date   ABLATION     CARDIAC  CATHETERIZATION  04/10/1999   per pt normal   CARDIOVASCULAR STRESS TEST  02-23-2011  dr Iantha Fallen [facility])   normal nuclear study/  no ischemia/  ef 64%   CATARACT EXTRACTION W/ INTRAOCULAR LENS  IMPLANT, BILATERAL  12/10/2001   Bil   CESAREAN SECTION  12/10/1982   CLOSED REDUCTION FINGER WITH PERCUTANEOUS PINNING Left 04/22/2014   Procedure: LEFT SMALL FINGER CLOSED REDUCTION PINNING;  Surgeon: Sharma Covert, MD;  Location: [facility];  Service: Orthopedics;  Laterality: Left;   COLONOSCOPY  11/09/2002   DILATION AND CURETTAGE OF UTERUS  1977   LAPAROSCOPIC CHOLECYSTECTOMY  03/18/2000   RECONSTRUCTION OF EYELID Bilateral 08/2022   TRANSTHORACIC ECHOCARDIOGRAM  02/23/2011   normal lvf/  ef 58%/  mild rve/   mild lae/  mild  mr  & tr/  mild pulmonary htn   VULVECTOMY N/A 12/25/2022   Procedure: PARTIAL  VULVECTOMY;  Surgeon: [name], MD;  Location: [facility];  Service: Gynecology;  Laterality: N/A;   VULVECTOMY N/A 02/12/2023   Procedure: WIDE LOCAL EXCISION OF VULVA AND REMOVAL OF CLITORIS;  Surgeon: [name], MD;  Location: [facility];  Service: Gynecology;  Laterality: N/A;   WISDOM TOOTH EXTRACTION     Social History   Tobacco Use   Smoking status: Never   Smokeless tobacco: Never  Vaping Use   Vaping status: Never Used  Substance Use Topics   Alcohol use: Yes    Comment: Only on cruises or rarely   Drug use: No   Family History  Problem Relation Age of Onset   Stroke Mother  Heart disease Mother    Hypertension Father    Diabetes Father    Hyperlipidemia Father    Breast cancer Sister    Breast cancer Sister    Diabetes Brother    Colon cancer Neg Hx    Ovarian cancer Neg Hx    Endometrial cancer Neg Hx    Pancreatic cancer Neg Hx    Prostate cancer Neg Hx    Allergies  Allergen Reactions   Ingrezza [Valbenazine Tosylate] Other (See Comments)    Night sweats, worsened tardive  dyskinesia, nightmares   Lidocaine Swelling    REACTION: swelling ALL CAINE DRUGS EXCEPT: marcaine & sensercaine Other reaction(s): Edema   Xylocaine [Lidocaine Hcl] Swelling    ALLERGIC TO ALL CAINES EXCEPT SENSORCAINE , AND MARCAINE   Amantadine    Austedo Xr [Deutetrabenazine]     Caused Muscle aches and tremors   Codeine Nausea And Vomiting    REACTION: vomits blood   Lithium Swelling   Tegretol [Carbamazepine] Other (See Comments)    REACTION: skin crawls   Current Outpatient Medications on File Prior to Visit  Medication Sig Dispense Refill   acetaminophen (TYLENOL) 325 MG tablet Take 2 tablets (650 mg total) by mouth every 4 (four) hours as needed for headache or mild pain (pain score 1-3).     apixaban (ELIQUIS) 5 MG TABS tablet Take 5 mg by mouth 2 (two) times daily.     ARIPiprazole (ABILIFY) 10 MG tablet Take 10 mg by mouth daily.     aspirin EC 81 MG tablet Take 1 tablet (81 mg total) by mouth daily. Swallow whole. 30 tablet 0   BIOTIN PO Take 1 tablet by mouth daily.     buPROPion (WELLBUTRIN XL) 300 MG 24 hr tablet Take 300 mg by mouth daily.     cetirizine (ZYRTEC ALLERGY) 10 MG tablet Take 10 mg by mouth daily as needed for allergies.     Cholecalciferol (VITAMIN D) 2000 UNITS CAPS Take 1 capsule by mouth daily.     clonazePAM (KLONOPIN) 1 MG tablet Take 1 mg by mouth at bedtime.     famotidine (PEPCID) 20 MG tablet Take 1 tablet (20 mg total) by mouth 2 (two) times daily. 180 tablet 1   fluticasone (FLONASE) 50 MCG/ACT nasal spray Place into the nose.     lamoTRIgine (LAMICTAL) 150 MG tablet Take 1 tablet (150 mg total) by mouth daily.     levothyroxine (SYNTHROID) 50 MCG tablet TAKE 1 TABLET EVERY DAY BEFORE BREAKFAST 90 tablet 2   lisinopril (ZESTRIL) 2.5 MG tablet Take 1 tablet (2.5 mg total) by mouth daily. 30 tablet 0   lovastatin (MEVACOR) 40 MG tablet TAKE 1 TABLET EVERY DAY 90 tablet 2   nystatin (MYCOSTATIN/NYSTOP) powder Apply 1 Application topically 3  (three) times daily. Under abdomen skin fold 15 g 2   Probiotic Product (PROBIOTIC ADVANCED PO) Take by mouth.     tiZANidine (ZANAFLEX) 4 MG tablet Take 4 mg by mouth every 6 (six) hours as needed.     zolpidem (AMBIEN) 10 MG tablet Take 10 mg by mouth at bedtime.     No current facility-administered medications on file prior to visit.    Review of Systems  Constitutional:  Positive for fatigue. Negative for activity change, appetite change, fever and unexpected weight change.  HENT:  Negative for congestion, ear pain, rhinorrhea, sinus pressure and sore throat.   Eyes:  Negative for pain, redness and visual disturbance.  Respiratory:  Positive for  cough and shortness of breath. Negative for chest tightness, wheezing and stridor.   Cardiovascular:  Negative for chest pain and palpitations.  Gastrointestinal:  Negative for abdominal pain, blood in stool, constipation and diarrhea.  Endocrine: Negative for polydipsia and polyuria.  Genitourinary:  Negative for dysuria, frequency and urgency.  Musculoskeletal:  Negative for arthralgias, back pain and myalgias.  Skin:  Negative for pallor and rash.  Allergic/Immunologic: Negative for environmental allergies.  Neurological:  Negative for dizziness, syncope and headaches.  Hematological:  Negative for adenopathy. Does not bruise/bleed easily.  Psychiatric/Behavioral:  Negative for decreased concentration and dysphoric mood. The patient is not nervous/anxious.        Objective:   Physical Exam Constitutional:      General: She is not in acute distress.    Appearance: Normal appearance. She is well-developed. She is obese.  HENT:     Head: Normocephalic and atraumatic.  Eyes:     Conjunctiva/sclera: Conjunctivae normal.     Pupils: Pupils are equal, round, and reactive to light.  Neck:     Thyroid: No thyromegaly.     Vascular: No carotid bruit or JVD.  Cardiovascular:     Rate and Rhythm: Regular rhythm. Bradycardia present.      Heart sounds: Normal heart sounds.     No gallop.  Pulmonary:     Effort: Pulmonary effort is normal. No respiratory distress.     Breath sounds: Normal breath sounds. No stridor. No wheezing, rhonchi or rales.     Comments: No shortness of breath with speech  Abdominal:     General: There is no distension or abdominal bruit.     Palpations: Abdomen is soft. There is no mass.     Tenderness: There is no abdominal tenderness. There is no guarding or rebound.  Musculoskeletal:     Cervical back: Normal range of motion and neck supple.     Right lower leg: No edema.     Left lower leg: No edema.     Comments: No pitting edema   Lymphadenopathy:     Cervical: No cervical adenopathy.  Skin:    General: Skin is warm and dry.     Coloration: Skin is not pale.     Findings: No rash.  Neurological:     Mental Status: She is alert.     Coordination: Coordination normal.     Deep Tendon Reflexes: Reflexes are normal and symmetric. Reflexes normal.  Psychiatric:        Attention and Perception: Attention normal.        Mood and Affect: Mood is anxious and depressed. Affect is tearful.           Assessment & Plan:   Problem List Items Addressed This Visit       Cardiovascular and Mediastinum   Paroxysmal atrial fibrillation (HCC)    Not in a fib today  Ablastion in July  Mobitz I seen in hospital  HR stable   For cardiology follow up 11/22  Reviewed hospital records, lab results and studies in detail        NSTEMI (non-ST elevated myocardial infarction) [facility])    Reviewed hospital records, lab results and studies in detail  Cp is resolved Still shortness of breath  For cardiology follow up 11/22        Other   Morbid obesity (HCC)    ? If playing a role in exercise intol  She did try stimulant med for weight loss In setting  of past a fib asked her to avoid these medications       Dyspnea - Primary    With exertion Normal 02 sat  Reassuring exam   Recent hosp  for NSTEMI Has cardiology follow up on 11/22  ? Consider echo  Lab today  Repeat cxr today  On eliquis so PE is unlikely (was anticoagulated during hosp)      Relevant Orders   DG Chest 2 View (Completed)   Decreased GFR    GFR over 60 on hospital Reviewed hospital records, lab results and studies in detail  Lab today      Relevant Orders   Basic metabolic panel (Completed)   Chest pain    Recent hosp  NSTEMI with normal stress test   Reviewed hospital records, lab results and studies in detail  Cp is resolved but still dyspnea on exertion       Anemia    Significant anemia with hb 9.3 in hospital for NSTEMI  Reviewed hospital records, lab results and studies in detail    Lab today  Still c/o shortness of breath and exercise intolerance       Relevant Orders   Pathologist smear review   Vitamin B12 (Completed)   Iron (Completed)   Ferritin (Completed)   Basic metabolic panel (Completed)   Reticulocytes   CBC with Differential/Platelet   Abnormal chest x-ray    Cxr in hospital for cp noted prominent bilateral interstitial opacities Venous congestion vs atypical infection   She is still short of breath on exertion  No fever or pedal edema  Will repeat film

## 2023-10-29 NOTE — Assessment & Plan Note (Signed)
Recent hosp  NSTEMI with normal stress test   Reviewed hospital records, lab results and studies in detail  Cp is resolved but still dyspnea on exertion

## 2023-10-29 NOTE — Assessment & Plan Note (Signed)
Cxr in hospital for cp noted prominent bilateral interstitial opacities Venous congestion vs atypical infection   She is still short of breath on exertion  No fever or pedal edema  Will repeat film

## 2023-10-29 NOTE — Assessment & Plan Note (Signed)
With exertion Normal 02 sat  Reassuring exam   Recent hosp for NSTEMI Has cardiology follow up on 11/22  ? Consider echo  Lab today  Repeat cxr today  On eliquis so PE is unlikely (was anticoagulated during hosp)

## 2023-10-29 NOTE — Assessment & Plan Note (Signed)
Not in a fib today  Ablastion in July  Mobitz I seen in hospital  HR stable   For cardiology follow up 11/22  Reviewed hospital records, lab results and studies in detail

## 2023-10-29 NOTE — Assessment & Plan Note (Signed)
GFR over 60 on hospital Reviewed hospital records, lab results and studies in detail  Lab today

## 2023-10-29 NOTE — Assessment & Plan Note (Signed)
?   If playing a role in exercise intol  She did try stimulant med for weight loss In setting of past a fib asked her to avoid these medications

## 2023-10-29 NOTE — Assessment & Plan Note (Signed)
Significant anemia with hb 9.3 in hospital for NSTEMI  Reviewed hospital records, lab results and studies in detail    Lab today  Still c/o shortness of breath and exercise intolerance

## 2023-10-29 NOTE — Patient Instructions (Signed)
Labs today  Chest xray today  We will contact you with results   If symptoms worsen go to the ER   Continue current medicines   See cardiology as planned    Follow up with mental health team

## 2023-10-29 NOTE — Assessment & Plan Note (Signed)
Reviewed hospital records, lab results and studies in detail  Cp is resolved Still shortness of breath  For cardiology follow up 11/22

## 2023-10-30 ENCOUNTER — Telehealth: Payer: Self-pay | Admitting: Family Medicine

## 2023-10-30 LAB — CBC WITH DIFFERENTIAL/PLATELET
Absolute Lymphocytes: 1408 {cells}/uL (ref 850–3900)
Absolute Monocytes: 546 {cells}/uL (ref 200–950)
Basophils Absolute: 62 {cells}/uL (ref 0–200)
Basophils Relative: 0.7 %
Eosinophils Absolute: 229 {cells}/uL (ref 15–500)
Eosinophils Relative: 2.6 %
HCT: 32.1 % — ABNORMAL LOW (ref 35.0–45.0)
Hemoglobin: 10.6 g/dL — ABNORMAL LOW (ref 11.7–15.5)
MCH: 30 pg (ref 27.0–33.0)
MCHC: 33 g/dL (ref 32.0–36.0)
MCV: 90.9 fL (ref 80.0–100.0)
MPV: 9.7 fL (ref 7.5–12.5)
Monocytes Relative: 6.2 %
Neutro Abs: 6556 {cells}/uL (ref 1500–7800)
Neutrophils Relative %: 74.5 %
Platelets: 390 10*3/uL (ref 140–400)
RBC: 3.53 10*6/uL — ABNORMAL LOW (ref 3.80–5.10)
RDW: 12.9 % (ref 11.0–15.0)
Total Lymphocyte: 16 %
WBC: 8.8 10*3/uL (ref 3.8–10.8)

## 2023-10-30 LAB — RETICULOCYTES
ABS Retic: 88250 {cells}/uL — ABNORMAL HIGH (ref 20000–80000)
Retic Ct Pct: 2.5 %

## 2023-10-30 LAB — PATHOLOGIST SMEAR REVIEW

## 2023-10-30 MED ORDER — FUROSEMIDE 20 MG PO TABS: 20.0000 mg | ORAL_TABLET | Freq: Every day | ORAL | 0 refills | Status: DC

## 2023-10-30 NOTE — Telephone Encounter (Signed)
Patient has been scheduled

## 2023-10-30 NOTE — Telephone Encounter (Signed)
Patient called in to ask about lasix being sent in to pharmacy for her.  [id number] [place], [place] - [address] Phone: [phone]  Fax: [phone]

## 2023-10-30 NOTE — Addendum Note (Signed)
Addended by: Roxy Manns A on: 10/30/2023 07:40 PM   Modules accepted: Orders

## 2023-10-30 NOTE — Telephone Encounter (Signed)
Rx sent and pt viewed results via mychart. Please schedule a non fasting lab appt sometime next week. thanks

## 2023-10-30 NOTE — Telephone Encounter (Signed)
-----   Message from [place] sent at 10/29/2023  7:41 PM EST ----- No pneumonia noted on chest xray  There are some findings in r lung base consistent for edema (fluid in lung) This is minimal but noted  Follow up with cardiology as planned (copy to cardiology as well as note please)  Please send in lasix 20 mg take 1 po every day for 3 days #3 no refills -to see if diuresis helps the breathing  We will need to re check lab for bmet next week after the lasix

## 2023-11-01 DIAGNOSIS — E66813 Obesity, class 3: Secondary | ICD-10-CM | POA: Diagnosis not present

## 2023-11-01 DIAGNOSIS — R001 Bradycardia, unspecified: Secondary | ICD-10-CM | POA: Diagnosis not present

## 2023-11-01 DIAGNOSIS — Z9889 Other specified postprocedural states: Secondary | ICD-10-CM | POA: Diagnosis not present

## 2023-11-01 DIAGNOSIS — R42 Dizziness and giddiness: Secondary | ICD-10-CM | POA: Diagnosis not present

## 2023-11-01 DIAGNOSIS — Z7901 Long term (current) use of anticoagulants: Secondary | ICD-10-CM | POA: Diagnosis not present

## 2023-11-01 DIAGNOSIS — R0602 Shortness of breath: Secondary | ICD-10-CM | POA: Diagnosis not present

## 2023-11-01 DIAGNOSIS — I10 Essential (primary) hypertension: Secondary | ICD-10-CM | POA: Diagnosis not present

## 2023-11-01 DIAGNOSIS — I480 Paroxysmal atrial fibrillation: Secondary | ICD-10-CM | POA: Diagnosis not present

## 2023-11-01 DIAGNOSIS — G4733 Obstructive sleep apnea (adult) (pediatric): Secondary | ICD-10-CM | POA: Diagnosis not present

## 2023-11-05 ENCOUNTER — Other Ambulatory Visit (INDEPENDENT_AMBULATORY_CARE_PROVIDER_SITE_OTHER): Payer: Medicare HMO

## 2023-11-05 DIAGNOSIS — D649 Anemia, unspecified: Secondary | ICD-10-CM | POA: Diagnosis not present

## 2023-11-05 LAB — IRON: Iron: 49 ug/dL (ref 42–145)

## 2023-11-05 LAB — CBC WITH DIFFERENTIAL/PLATELET
Basophils Absolute: 0.1 10*3/uL (ref 0.0–0.1)
Basophils Relative: 0.9 % (ref 0.0–3.0)
Eosinophils Absolute: 0.1 10*3/uL (ref 0.0–0.7)
Eosinophils Relative: 1.6 % (ref 0.0–5.0)
HCT: 33.7 % — ABNORMAL LOW (ref 36.0–46.0)
Hemoglobin: 11 g/dL — ABNORMAL LOW (ref 12.0–15.0)
Lymphocytes Relative: 18.5 % (ref 12.0–46.0)
Lymphs Abs: 1.3 10*3/uL (ref 0.7–4.0)
MCHC: 32.8 g/dL (ref 30.0–36.0)
MCV: 92.6 fL (ref 78.0–100.0)
Monocytes Absolute: 0.4 10*3/uL (ref 0.1–1.0)
Monocytes Relative: 6.1 % (ref 3.0–12.0)
Neutro Abs: 5.2 10*3/uL (ref 1.4–7.7)
Neutrophils Relative %: 72.9 % (ref 43.0–77.0)
Platelets: 300 10*3/uL (ref 150.0–400.0)
RBC: 3.64 Mil/uL — ABNORMAL LOW (ref 3.87–5.11)
RDW: 14.3 % (ref 11.5–15.5)
WBC: 7.1 10*3/uL (ref 4.0–10.5)

## 2023-11-05 LAB — FERRITIN: Ferritin: 125 ng/mL (ref 10.0–291.0)

## 2023-11-12 ENCOUNTER — Ambulatory Visit: Payer: Medicare HMO | Admitting: Family Medicine

## 2023-11-12 DIAGNOSIS — R0602 Shortness of breath: Secondary | ICD-10-CM | POA: Diagnosis not present

## 2023-11-12 DIAGNOSIS — J9811 Atelectasis: Secondary | ICD-10-CM | POA: Diagnosis not present

## 2023-11-12 DIAGNOSIS — R0989 Other specified symptoms and signs involving the circulatory and respiratory systems: Secondary | ICD-10-CM | POA: Diagnosis not present

## 2023-11-12 DIAGNOSIS — G479 Sleep disorder, unspecified: Secondary | ICD-10-CM | POA: Diagnosis not present

## 2023-11-12 DIAGNOSIS — R053 Chronic cough: Secondary | ICD-10-CM | POA: Diagnosis not present

## 2023-11-12 DIAGNOSIS — J301 Allergic rhinitis due to pollen: Secondary | ICD-10-CM | POA: Diagnosis not present

## 2023-11-13 ENCOUNTER — Ambulatory Visit: Payer: Medicare HMO | Admitting: Psychology

## 2023-11-13 DIAGNOSIS — F3181 Bipolar II disorder: Secondary | ICD-10-CM

## 2023-11-13 NOTE — Progress Notes (Signed)
[facility] Counselor/Therapist Progress Note  Patient ID: [name] [name], MRN: [id number],    Date: 11/13/2023  Time Spent: 55 minutes  Time in: 2:00 Time out 2: 55  Treatment Type: Individual Therapy  Reported Symptoms: none  Mental Status Exam: Appearance:  Casual     Behavior: Appropriate  Motor: Normal  Speech/Language:  normal  Affect: Appropriate  Mood: pleasant  Thought process: normal  Thought content:   WNL  Sensory/Perceptual disturbances:   WNL  Orientation: oriented to person, place, time/date, and situation  Attention: Good  Concentration: Good  Memory: WNL  Fund of knowledge:  Good  Insight:   Good  Judgment:  Good  Impulse Control: Good   Risk Assessment: Danger to Self:  No Self-injurious Behavior: No Danger to Others: No Duty to Warn:no Physical Aggression / Violence:No  Access to Firearms a concern: No  Gang Involvement:No   Subjective: The patient attended a face-to-face individual therapy session via video visit today.  The patient agreed and consented for this session to be on caregility and patient is aware of limitations of telehealth.  The patient was in her home alone and the therapist was in the office.  The patient presents with a blunted affect and mood is pleasant.  The patient reports that there has been a lot happening since I talked to her last.  She states that she missed going on her trip to [place] because she had numerous health issues.  She reports that she ended up having to go to the doctor because she was feeling bad and the doctor sent her to the hospital and she was admitted to the hospital because they were concerned about her heart.  They did some testing and it seems that her heart was okay however she ended up getting a referral to a pulmonologist and they are treating her for some pulmonary issues.  We talked more today about the need for she and her husband to really have some hard conversations about whether they  want to continue living in the big house that they are in her how they want to manage moving forward since her health is not as good as it has been.  It seems that her husband really does not want to sell the house or do anything different but the patient is aware that she is not going to be able to take care of it like she would normally do because of her health issues.  We talked about her doing good things for herself and making sure she is following up with her appointments. Interventions: Cognitive Behavioral Therapy, meditation and mindfulness  Diagnosis:Bipolar II disorder (HCC)  Plan: Client Abilities/Strengths  intelligent, insightful  Client Treatment Preferences  Outpatient individual therapy  Client Statement of Needs  "I need someone to help monitor and manage my bipolar"  Treatment Level  Outpatient individual therapy  Symptoms  Depressed or irritable mood.: (Status: improved). Diminished interest in or  enjoyment of activities.: (Status: improved). Displays a poor attention span and is easily distracted.:  (Status: improved). Exhibits an abnormally and  persistently elevated, expansive, or irritable mood with at least three symptoms of mania (i.e., inflated  self-esteem or grandiosity, decreased need for sleep, pressured speech, flight of ideas, distractibility,  excessive goal-directed activity or psychomotor agitation, excessive involvement in pleasurable, highrisk behavior).:  (Status: improved). Feelings of hopelessness, worthlessness, or inappropriate guilt.: (Status: improved). History of at least one hypomanic,  manic, or mixed mood episode.:  (Status: maintained). History of  chronic or  recurrent depression for which the client has taken antidepressant medication, been hospitalized, had  outpatient treatment, or had a course of electroconvulsive therapy.:  (Status:  maintained). Lack of energy.: (Status: improved). Lacks follow-through in  projects, even though energy  is very high, since behavior lacks discipline and goal-directedness.:  (Status: improved). Poor concentration and indecisiveness.:  (Status: improved). Reports flight of ideas or thoughts racing.: (Status: improved). Shows evidence of a decreased need for sleep.: (Status: improved).  Sleeplessness or hypersomnia.:  (Status: improved). Social withdrawal.: (Status: improved). Suicidal thoughts and/or gestures.:   (Status: improved). The elevated mood or irritability (mania) causes marked impairment in  occupational functioning, social activities, or relationships with others.: (Status: improved). Verbalizes grandiose ideas and/or persecutory beliefs.:  (Status: improved).  Problems Addressed  Bipolar Disorder - Mania, Bipolar Disorder - Depression, Bipolar Disorder - Mania, Bipolar Disorder -  Mania, Bipolar Disorder - Depression, Bipolar Disorder - Depression, Bipolar Disorder - Mania  Goals 1. Achieve controlled behavior, moderated mood, more deliberative  speech and thought process, and a stable daily activity pattern. 2. Achieve controlled behavior, moderated mood, more deliberative  speech and thought process, and a stable daily activity pattern. 3. Alleviate manic/hypomanic mood and return to previous level of  effective functioning. 4. Develop healthy cognitive patterns and beliefs about self and the world  that lead to alleviation and help prevent the relapse of  manic/hypomanic episodes. Objective Maintain a pattern of regular rhythm to daily activities. Target Date: 2024-05-07 Frequency: Monthly Progress: 80 Modality: individual Related Interventions 1. Assist the client in establishing a more routine pattern of daily activities such as sleeping,  eating, solitary and social activities, and exercise; use and review a form to schedule, assess,  and modify these activities so that they occur in a predictable rhythm every day. Objective Identify and replace thoughts and behaviors  that trigger manic or depressive symptoms. Target Date: 2024-05-07 Frequency: Monthly Progress: 80 Modality: individual Related Interventions 1. Use cognitive therapy techniques to explore and educate the client about cognitive biases that  trigger his/her elevated or depressive mood (see Cognitive Therapy for Bipolar Disorder by  Aggie Hacker al.). 2. Teach the client cognitive-behavioral coping and relapse prevention skills including delaying  impulsive actions, structured scheduling of daily activities, keeping a regular sleep routine,  avoiding unrealistic goal striving, using relaxation procedures, identifying and avoiding episode triggers such as stimulant drug use, alcohol consumption, breaking sleep routine, or exposing  self to high stress (see Cognitive Therapy for Bipolar Disorder by Aggie Hacker al.). 3. Use cognitive therapy techniques to explore and educate the client's about cognitive biases that  trigger his/her elevated or depressive mood (see Cognitive Therapy for Bipolar Disorder by  Aggie Hacker al.). Objective Discuss and resolve troubling personal and interpersonal issues. Target Date: 2024-05-07 Frequency: Monthly Progress: 60 Modality: individual Objective Participate in periodic "maintenance" sessions. Target Date: 2024-05-07 Frequency: Monthly Progress: 80 Modality: individual Related Interventions 1. Hold periodic "maintenance" sessions within the first few months after therapy to facilitate the  client's positive changes; problem-solve obstacles to improvement. 2. Hold periodic "maintenance" sessions within the first few months after therapy to facilitate the  client's positive changes; problem-solve obstacles to improvement. Objective Describe mood state, energy level, amount of control over thoughts, and sleeping pattern. Target Date: 2024-05-07 Frequency: Monthly Progress: 80 Modality: individual Related Interventions 1. Encourage the client to share his/her thoughts and  feelings; express empathy and build rapport  while assessing primary cognitive, behavioral, interpersonal, or other symptoms of  the mood  disorder. 2. Assess presence, severity, and impact of past and present mood episodes on social,  occupational, and interpersonal functioning; supplement with semi-structured inventory, if  desired (e.g., Montgomery-Asberg Depression Rating Scale, Inventory to Diagnose  Depression). 3. Encourage the client to share his/her thoughts and feelings; express empathy, and build rapport  while assessing primary cognitive, behavioral, interpersonal, or other symptoms of the mood  disorder. 4. Assess presence, severity, and impact of past and present mood episodes including mania (i.e.,  pressured speech, impulsive behavior, euphoric mood, flight of ideas, reduced need for sleep,  inflated self-esteem, and high energy) on social, occupational, and interpersonal functioning;  supplement with semi-structured inventory, if desired (e.g., Young Mania Rating Scale; the  Clinical Monitoring Form); re-administer as indicated to assess treatment response. 5. Develop healthy cognitive patterns and beliefs about self and the world  that lead to alleviation and help prevent the relapse of mood  episodes. 6. Normalize energy level and return to usual activities, good judgment,  stable mood, more realistic expectations, and goal-directed behavior. 7. Normalize energy level and return to usual activities, good judgment,  stable mood, more realistic expectations, and goal-directed behavior. Diagnosis 296.52 (Bipolar I disorder, most recent episode (or current) depressed, moderate)  Medications  1. Ambien (Dosage: 10mg  qhs)  2. Klonopin (Dosage: 1mg  TID)  3. Olanzapine (Dosage: 5 mg every other day and not on weekends)  4. Prozac (Dosage: 20 mg every other day and not on weekends)  5. Wellbutrin (Dosage: 450 mg qd)  Conditions For Discharge Achievement of treatment goals and  objectives  The patient approved this treatment plan and is making progress towards goals.  Laityn Bensen G Arvis Zwahlen, LCSW

## 2023-11-15 ENCOUNTER — Ambulatory Visit: Payer: Medicare HMO | Admitting: Nurse Practitioner

## 2023-11-15 DIAGNOSIS — R601 Generalized edema: Secondary | ICD-10-CM | POA: Diagnosis not present

## 2023-11-18 ENCOUNTER — Ambulatory Visit
Admission: RE | Admit: 2023-11-18 | Discharge: 2023-11-18 | Disposition: A | Discharge status: Home / Self Care | Payer: Medicare HMO | Source: Ambulatory Visit | Attending: Family Medicine | Admitting: Family Medicine

## 2023-11-18 DIAGNOSIS — Z1231 Encounter for screening mammogram for malignant neoplasm of breast: Secondary | ICD-10-CM

## 2023-11-22 ENCOUNTER — Other Ambulatory Visit
Admission: RE | Admit: 2023-11-22 | Discharge: 2023-11-22 | Disposition: A | Discharge status: Home / Self Care | Payer: Medicare HMO | Source: Ambulatory Visit | Attending: Internal Medicine | Admitting: Internal Medicine

## 2023-11-22 DIAGNOSIS — I480 Paroxysmal atrial fibrillation: Secondary | ICD-10-CM | POA: Diagnosis not present

## 2023-11-22 DIAGNOSIS — R001 Bradycardia, unspecified: Secondary | ICD-10-CM | POA: Diagnosis not present

## 2023-11-22 DIAGNOSIS — I10 Essential (primary) hypertension: Secondary | ICD-10-CM | POA: Diagnosis not present

## 2023-11-22 DIAGNOSIS — I509 Heart failure, unspecified: Secondary | ICD-10-CM | POA: Diagnosis not present

## 2023-11-22 DIAGNOSIS — R42 Dizziness and giddiness: Secondary | ICD-10-CM | POA: Diagnosis not present

## 2023-11-22 DIAGNOSIS — R0602 Shortness of breath: Secondary | ICD-10-CM | POA: Diagnosis not present

## 2023-11-22 DIAGNOSIS — Z7901 Long term (current) use of anticoagulants: Secondary | ICD-10-CM | POA: Diagnosis not present

## 2023-11-22 DIAGNOSIS — E66813 Obesity, class 3: Secondary | ICD-10-CM | POA: Diagnosis not present

## 2023-11-22 DIAGNOSIS — Z9889 Other specified postprocedural states: Secondary | ICD-10-CM | POA: Diagnosis not present

## 2023-11-22 LAB — BRAIN NATRIURETIC PEPTIDE: B Natriuretic Peptide: 131.2 pg/mL — ABNORMAL HIGH (ref 0.0–100.0)

## 2023-11-25 ENCOUNTER — Encounter
Admission: RE | Disposition: A | Discharge status: Home / Self Care | Payer: Self-pay | Source: Home / Self Care | Attending: Internal Medicine

## 2023-11-25 ENCOUNTER — Other Ambulatory Visit: Payer: Self-pay

## 2023-11-25 ENCOUNTER — Ambulatory Visit
Admission: RE | Admit: 2023-11-25 | Discharge: 2023-11-25 | Disposition: A | Discharge status: Home / Self Care | Payer: Medicare HMO | Attending: Internal Medicine | Admitting: Internal Medicine

## 2023-11-25 DIAGNOSIS — Z8249 Family history of ischemic heart disease and other diseases of the circulatory system: Secondary | ICD-10-CM | POA: Diagnosis not present

## 2023-11-25 DIAGNOSIS — I10 Essential (primary) hypertension: Secondary | ICD-10-CM | POA: Insufficient documentation

## 2023-11-25 DIAGNOSIS — Z6841 Body Mass Index (BMI) 40.0 and over, adult: Secondary | ICD-10-CM | POA: Diagnosis not present

## 2023-11-25 DIAGNOSIS — G4733 Obstructive sleep apnea (adult) (pediatric): Secondary | ICD-10-CM | POA: Diagnosis not present

## 2023-11-25 DIAGNOSIS — E785 Hyperlipidemia, unspecified: Secondary | ICD-10-CM | POA: Insufficient documentation

## 2023-11-25 DIAGNOSIS — I272 Pulmonary hypertension, unspecified: Secondary | ICD-10-CM

## 2023-11-25 DIAGNOSIS — E66813 Obesity, class 3: Secondary | ICD-10-CM | POA: Diagnosis not present

## 2023-11-25 DIAGNOSIS — R0602 Shortness of breath: Secondary | ICD-10-CM

## 2023-11-25 DIAGNOSIS — Z7901 Long term (current) use of anticoagulants: Secondary | ICD-10-CM | POA: Insufficient documentation

## 2023-11-25 DIAGNOSIS — I480 Paroxysmal atrial fibrillation: Secondary | ICD-10-CM | POA: Diagnosis not present

## 2023-11-25 HISTORY — PX: RIGHT/LEFT HEART CATH AND CORONARY ANGIOGRAPHY: CATH118266

## 2023-11-25 LAB — POCT I-STAT EG7
Acid-Base Excess: 0 mmol/L (ref 0.0–2.0)
Bicarbonate: 25.8 mmol/L (ref 20.0–28.0)
Calcium, Ion: 1.18 mmol/L (ref 1.15–1.40)
HCT: 32 % — ABNORMAL LOW (ref 36.0–46.0)
Hemoglobin: 10.9 g/dL — ABNORMAL LOW (ref 12.0–15.0)
O2 Saturation: 66 %
Potassium: 3.8 mmol/L (ref 3.5–5.1)
Sodium: 135 mmol/L (ref 135–145)
TCO2: 27 mmol/L (ref 22–32)
pCO2, Ven: 46.1 mm[Hg] (ref 44–60)
pH, Ven: 7.356 (ref 7.25–7.43)
pO2, Ven: 36 mm[Hg] (ref 32–45)

## 2023-11-25 LAB — POCT I-STAT 7, (LYTES, BLD GAS, ICA,H+H)
Acid-Base Excess: 2 mmol/L (ref 0.0–2.0)
Bicarbonate: 27.3 mmol/L (ref 20.0–28.0)
Calcium, Ion: 1.2 mmol/L (ref 1.15–1.40)
HCT: 32 % — ABNORMAL LOW (ref 36.0–46.0)
Hemoglobin: 10.9 g/dL — ABNORMAL LOW (ref 12.0–15.0)
O2 Saturation: 94 %
Potassium: 3.9 mmol/L (ref 3.5–5.1)
Sodium: 139 mmol/L (ref 135–145)
TCO2: 29 mmol/L (ref 22–32)
pCO2 arterial: 43.9 mm[Hg] (ref 32–48)
pH, Arterial: 7.402 (ref 7.35–7.45)
pO2, Arterial: 69 mm[Hg] — ABNORMAL LOW (ref 83–108)

## 2023-11-25 LAB — CARDIAC CATHETERIZATION: Cath EF Quantitative: 60 %

## 2023-11-25 SURGERY — RIGHT/LEFT HEART CATH AND CORONARY ANGIOGRAPHY
Anesthesia: Moderate Sedation | Laterality: Bilateral

## 2023-11-25 MED ORDER — SODIUM CHLORIDE 0.9 % WEIGHT BASED INFUSION
1.0000 mL/kg/h | INTRAVENOUS | Status: DC
  Administered 2023-11-25: 1 mL/kg/h via INTRAVENOUS

## 2023-11-25 MED ORDER — HEPARIN (PORCINE) IN NACL 1000-0.9 UT/500ML-% IV SOLN
INTRAVENOUS | Status: AC
  Filled 2023-11-25: qty 1000

## 2023-11-25 MED ORDER — HEPARIN (PORCINE) IN NACL 1000-0.9 UT/500ML-% IV SOLN
INTRAVENOUS | Status: DC | PRN
  Administered 2023-11-25: 1000 mL

## 2023-11-25 MED ORDER — FENTANYL CITRATE (PF) 100 MCG/2ML IJ SOLN
INTRAMUSCULAR | Status: AC
  Filled 2023-11-25: qty 2

## 2023-11-25 MED ORDER — VERAPAMIL HCL 2.5 MG/ML IV SOLN
INTRAVENOUS | Status: AC
Start: 2023-11-25 — End: ?
  Filled 2023-11-25: qty 2

## 2023-11-25 MED ORDER — MIDAZOLAM HCL 2 MG/2ML IJ SOLN
INTRAMUSCULAR | Status: DC | PRN
  Administered 2023-11-25: 1 mg via INTRAVENOUS

## 2023-11-25 MED ORDER — LIDOCAINE HCL 1 % IJ SOLN: INTRAMUSCULAR | Status: DC | PRN

## 2023-11-25 MED ORDER — FENTANYL CITRATE (PF) 100 MCG/2ML IJ SOLN
INTRAMUSCULAR | Status: DC | PRN
  Administered 2023-11-25: 25 ug via INTRAVENOUS

## 2023-11-25 MED ORDER — VERAPAMIL HCL 2.5 MG/ML IV SOLN
INTRAVENOUS | Status: DC | PRN
  Administered 2023-11-25: 2.5 mg via INTRA_ARTERIAL

## 2023-11-25 MED ORDER — SODIUM CHLORIDE 0.9 % IV SOLN
250.0000 mL | INTRAVENOUS | Status: DC | PRN
Start: 2023-11-25 — End: 2023-11-25

## 2023-11-25 MED ORDER — IOHEXOL 300 MG/ML  SOLN
INTRAMUSCULAR | Status: DC | PRN
  Administered 2023-11-25: 80 mL

## 2023-11-25 MED ORDER — SODIUM CHLORIDE 0.9 % WEIGHT BASED INFUSION
3.0000 mL/kg/h | INTRAVENOUS | Status: AC
  Administered 2023-11-25: 3 mL/kg/h via INTRAVENOUS

## 2023-11-25 MED ORDER — SODIUM CHLORIDE 0.9% FLUSH: 3.0000 mL | Freq: Two times a day (BID) | INTRAVENOUS | Status: DC

## 2023-11-25 MED ORDER — ASPIRIN 81 MG PO CHEW: 81.0000 mg | CHEWABLE_TABLET | ORAL | Status: DC

## 2023-11-25 MED ORDER — MIDAZOLAM HCL 2 MG/2ML IJ SOLN
INTRAMUSCULAR | Status: AC
  Filled 2023-11-25: qty 2

## 2023-11-25 MED ORDER — SODIUM CHLORIDE 0.9% FLUSH: 3.0000 mL | INTRAVENOUS | Status: DC | PRN

## 2023-11-25 MED ORDER — HEPARIN SODIUM (PORCINE) 1000 UNIT/ML IJ SOLN
INTRAMUSCULAR | Status: DC | PRN
  Administered 2023-11-25: 4000 [IU] via INTRAVENOUS

## 2023-11-25 MED ORDER — HEPARIN SODIUM (PORCINE) 1000 UNIT/ML IJ SOLN
INTRAMUSCULAR | Status: AC
  Filled 2023-11-25: qty 10

## 2023-11-25 MED ORDER — BUPIVACAINE HCL (PF) 0.5 % IJ SOLN
INTRAMUSCULAR | Status: DC | PRN
  Administered 2023-11-25: 2 mL

## 2023-11-25 SURGICAL SUPPLY — 15 items
CATH 5F 110X4 TIG (CATHETERS) IMPLANT
CATH 5FR JL3.5 JR4 ANG PIG MP (CATHETERS) IMPLANT
CATH BALLN WEDGE 5F 110CM (CATHETERS) IMPLANT
CATH INFINITI JR4 5F (CATHETERS) IMPLANT
DEVICE RAD TR BAND REGULAR (VASCULAR PRODUCTS) IMPLANT
DRAPE BRACHIAL (DRAPES) IMPLANT
GLIDESHEATH SLEND SS 6F .021 (SHEATH) IMPLANT
GUIDEWIRE EMER 3M J .025X150CM (WIRE) IMPLANT
GUIDEWIRE INQWIRE 1.5J.035X260 (WIRE) IMPLANT
INQWIRE 1.5J .035X260CM (WIRE) ×1
PACK CARDIAC CATH (CUSTOM PROCEDURE TRAY) ×1 IMPLANT
PROTECTION STATION PRESSURIZED (MISCELLANEOUS) ×1
SET ATX-X65L (MISCELLANEOUS) IMPLANT
SHEATH GLIDE SLENDER 4/5FR (SHEATH) IMPLANT
STATION PROTECTION PRESSURIZED (MISCELLANEOUS) IMPLANT

## 2023-11-26 ENCOUNTER — Encounter: Payer: Self-pay | Admitting: Internal Medicine

## 2023-12-02 DIAGNOSIS — J301 Allergic rhinitis due to pollen: Secondary | ICD-10-CM | POA: Diagnosis not present

## 2023-12-02 DIAGNOSIS — R0602 Shortness of breath: Secondary | ICD-10-CM | POA: Diagnosis not present

## 2023-12-02 DIAGNOSIS — G4719 Other hypersomnia: Secondary | ICD-10-CM | POA: Diagnosis not present

## 2023-12-02 DIAGNOSIS — I272 Pulmonary hypertension, unspecified: Secondary | ICD-10-CM | POA: Diagnosis not present

## 2023-12-02 DIAGNOSIS — R600 Localized edema: Secondary | ICD-10-CM | POA: Diagnosis not present

## 2023-12-02 DIAGNOSIS — R001 Bradycardia, unspecified: Secondary | ICD-10-CM | POA: Diagnosis not present

## 2023-12-02 DIAGNOSIS — G479 Sleep disorder, unspecified: Secondary | ICD-10-CM | POA: Diagnosis not present

## 2023-12-04 ENCOUNTER — Other Ambulatory Visit: Payer: Self-pay | Admitting: Family Medicine

## 2023-12-06 ENCOUNTER — Ambulatory Visit: Payer: Medicare HMO | Admitting: Psychology

## 2023-12-16 ENCOUNTER — Ambulatory Visit (INDEPENDENT_AMBULATORY_CARE_PROVIDER_SITE_OTHER): Payer: Medicare HMO | Admitting: Family Medicine

## 2023-12-16 ENCOUNTER — Other Ambulatory Visit: Payer: Self-pay | Admitting: Family Medicine

## 2023-12-16 ENCOUNTER — Telehealth: Payer: Self-pay

## 2023-12-16 DIAGNOSIS — R7303 Prediabetes: Secondary | ICD-10-CM | POA: Diagnosis not present

## 2023-12-16 DIAGNOSIS — R0609 Other forms of dyspnea: Secondary | ICD-10-CM | POA: Diagnosis not present

## 2023-12-16 DIAGNOSIS — I129 Hypertensive chronic kidney disease with stage 1 through stage 4 chronic kidney disease, or unspecified chronic kidney disease: Secondary | ICD-10-CM | POA: Diagnosis not present

## 2023-12-16 DIAGNOSIS — F3177 Bipolar disorder, in partial remission, most recent episode mixed: Secondary | ICD-10-CM | POA: Diagnosis not present

## 2023-12-16 DIAGNOSIS — N1832 Chronic kidney disease, stage 3b: Secondary | ICD-10-CM | POA: Diagnosis not present

## 2023-12-16 DIAGNOSIS — R600 Localized edema: Secondary | ICD-10-CM | POA: Diagnosis not present

## 2023-12-16 LAB — POCT GLYCOSYLATED HEMOGLOBIN (HGB A1C): Hemoglobin A1C: 5.7 % — AB (ref 4.0–5.6)

## 2023-12-16 MED ORDER — SEMAGLUTIDE-WEIGHT MANAGEMENT 0.25 MG/0.5ML ~~LOC~~ SOAJ: 0.2500 mg | SUBCUTANEOUS | 0 refills | Status: DC

## 2023-12-16 NOTE — Telephone Encounter (Signed)
 Please let pt know this is not on her formulary (as expected)  I recommend she go forward with the weight watchers plan   She can check with her insurance plan in the next 6 months to see if they anticipate GLP-1 drugs will be covered for weight loss in future

## 2023-12-16 NOTE — Telephone Encounter (Signed)
 Copied from CRM [id number]. Topic: Clinical - Request for Lab/Test Order >> Dec 16, 2023 11:55 AM Leila C wrote: Reason for CRM: Patient would like CPX labs ordered in the system, patient is scheduled for labs 05/25/24 and CPX 06/01/24, please call back at [phone]

## 2023-12-16 NOTE — Assessment & Plan Note (Signed)
 With normal 02 sat in setting of morbid obesity, mild pulmonary HTN and mild reactive airways Also LE edema Likely OSA (pending sleep study)  Continues follow up with cardiology and pulmonary  Torsemide started by nephrology-to start tomorrow am/hoping this helps

## 2023-12-16 NOTE — Assessment & Plan Note (Signed)
 Discussed how this problem influences overall health and the risks it imposes  Reviewed plan for weight loss with lower calorie diet (via better food choices (lower glycemic and portion control) along with exercise building up to or more than 30 minutes 5 days per week including some aerobic activity and strength training   42.22 kg/m  Pt is having difficulty exercising with recent shortness of breath which may be weight related  Likely OSA-pending sleep study Prediabetic  Lab Results  Component Value Date   HGBA1C 5.7 (A) 12/16/2023   Psychiatric medications including abilify  and lamictal  making it different to loose   Encouraged low glycemic diet  Encouraged strength training if able   Planning to continue weight watchers Interested in GLP-1 med Disc option of GLP medication including possible side effects like GI intolerance and risk of thyroid  and endocrine cancer, pancreatitis and gallstones, kidney problems and diabetic retinopathy  Prescription for wegovy  sent to pharmacy -will not likely be covered If so she may pursue this through [facility] program

## 2023-12-16 NOTE — Assessment & Plan Note (Signed)
 Under psychiatric care for this  Both abilify and lamictal may add to difficulty losing weight

## 2023-12-16 NOTE — Progress Notes (Signed)
 Subjective:    Patient ID: [name] [name], female    DOB: 1956-02-25, 68 y.o.   MRN: [id number]  HPI  Wt Readings from Last 3 Encounters:  12/16/23 227 lb 2 oz (103 kg)  11/25/23 214 lb 6.4 oz (97.3 kg)  10/29/23 223 lb 4 oz (101.3 kg)   42.22 kg/m  Vitals:   12/16/23 1518  BP: 132/76  Pulse: 60  Temp: 97.6 F (36.4 C)  SpO2: 97%    Pt presents to discuss Weight/obesity  Shortness of breath   History of morbid obesity causing issues with exercise intolerance  Also CAD with NSTEMI in past   Right heart cath last month (for pulm HTN)   The left ventricular systolic function is normal.   LV end diastolic pressure is normal.   LV end diastolic pressure is normal.   The left ventricular ejection fraction is 55-65% by visual estimate.   Hemodynamic findings consistent with mild pulmonary hypertension.   No indication for antiplatelet therapy at this time .  Saw pulmonary  Recommended eval for sleep apnea  Noted some reactive airways    Had allergy testing   Still cannot breathe well with exertion  Cannot exercise and it is frustration   She just started with weight watchers last week  Not the plan that includes medicine    She does eat regular meals and some snacks  No sweetened drinks  One diet drink per day   Not an emotional eater   Some chocolate   Strong family history of obesity  One family member with anorexia     History of prediabetes Lab Results  Component Value Date   HGBA1C 5.7 (A) 12/16/2023   GERD  Anemia  Lab Results  Component Value Date   WBC 7.1 11/05/2023   HGB 10.9 (L) 11/25/2023   HCT 32.0 (L) 11/25/2023   MCV 92.6 11/05/2023   PLT 300.0 11/05/2023     Hypertensive chronic kidney disease  Lab Results  Component Value Date   NA 139 11/25/2023   K 3.9 11/25/2023   CO2 26 10/29/2023   GLUCOSE 91 10/29/2023   BUN 13 10/29/2023   CREATININE 1.07 10/29/2023   CALCIUM 9.4 10/29/2023   GFR 54.46 (L) 10/29/2023    GFRNONAA >60 10/23/2023   GFR went down to 36 day of cath  Nephrology put her back on torsemide -to start tomorrow Ankles are very swollen     Hyperlipidemia Lab Results  Component Value Date   CHOL 111 10/23/2023   HDL 55 10/23/2023   LDLCALC 43 10/23/2023   TRIG 66 10/23/2023   CHOLHDL 2.0 10/23/2023   Lab Results  Component Value Date   ALT 16 06/29/2023   AST 25 06/29/2023   ALKPHOS 69 06/29/2023   BILITOT 0.6 06/29/2023     Takes for mental health  Abilify  Lamictal  Klonopin  Ambien  prn   Patient Active Problem List   Diagnosis Date Noted   Anemia 10/29/2023   Chest pain 10/22/2023   NSTEMI (non-ST elevated myocardial infarction) (HCC) 10/22/2023   Pain of left hip 09/11/2023   History of anal fissures 09/09/2023   Acute left ankle pain 08/09/2023   Osteopenia 07/30/2023   Headache 07/05/2023   Paroxysmal atrial fibrillation (HCC) 06/27/2023   Bradycardia 05/30/2023   Melanoma in situ (HCC) 12/25/2022   ASCUS of cervix with negative high risk HPV 06/25/2022   Osteoarthritis of knee 05/25/2022   Hemorrhoid 04/25/2022   Estrogen deficiency 01/02/2022   Bipolar  disorder, in partial remission, most recent episode mixed (HCC) 02/25/2020   Dyspnea 01/19/2020   Prediabetes 03/03/2018   Atrophic vaginitis 07/08/2017   Cervical stenosis (uterine cervix) 01/03/2017   Hearing loss 10/25/2015   Routine general medical examination at a health care facility 10/17/2015   Decreased GFR 10/22/2014   Colon cancer screening 10/16/2013   Enthesopathy of ankle and tarsus 12/01/2009   Enthesopathy of ankle and tarsus 12/01/2009   Asymptomatic postmenopausal status 09/30/2009   Arthropathy of pelvic region and thigh 03/10/2009   DEGENERATIVE DISC DISEASE, LUMBAR SPINE 03/10/2009   DDD (degenerative disc disease), lumbosacral 03/10/2009   ONYCHOMYCOSIS 09/17/2008   Hypothyroidism 08/05/2007   Morbid obesity (HCC) 08/05/2007   Bipolar disorder (HCC) 08/05/2007   Allergic  rhinitis 08/05/2007   IBS 08/05/2007   GERD (gastroesophageal reflux disease) 08/05/2007   HYPERCHOLESTEROLEMIA, PURE 07/07/2007   Past Medical History:  Diagnosis Date   Actinic keratosis 03/29/2021   R central upper lip   Allergic rhinitis    Allergy    Anemia 10/29/2023   Anxiety    Arthritis    Atypical mole 12/26/2021   spinal upper back, exc 01/15/22   Basal cell carcinoma 10/16/2010   R lower pretibia    Bipolar 1 disorder (HCC)    Depression    Fissure, anal    GERD (gastroesophageal reflux disease)    History of basal cell carcinoma excision    ON FACE   History of squamous cell carcinoma excision    ON RIGHT LEG   Hyperlipidemia    Hypothyroidism    IBS (irritable bowel syndrome)    Liver cyst    Malignant melanoma in situ (HCC) 11/20/2022   Right labia minora. Excision 12/25/2022 at anterior vulva, left of clitoral hood, margins involved. Surgery scheduled for 02/12/2023.   Mallet finger of left hand    small finger   NSTEMI (non-ST elevated myocardial infarction) (HCC) 10/22/2023   Pre-diabetes    Past Surgical History:  Procedure Laterality Date   ABLATION     CARDIAC CATHETERIZATION  04/10/1999   per pt normal   CARDIOVASCULAR STRESS TEST  02-23-2011  dr vinie fath [facility] clinic)   normal nuclear study/  no ischemia/  ef 64%   CATARACT EXTRACTION W/ INTRAOCULAR LENS  IMPLANT, BILATERAL  12/10/2001   Bil   CESAREAN SECTION  12/10/1982   CLOSED REDUCTION FINGER WITH PERCUTANEOUS PINNING Left 04/22/2014   Procedure: LEFT SMALL FINGER CLOSED REDUCTION PINNING;  Surgeon: Prentice LELON Pagan, MD;  Location: [facility];  Service: Orthopedics;  Laterality: Left;   COLONOSCOPY  11/09/2002   DILATION AND CURETTAGE OF UTERUS  1977   LAPAROSCOPIC CHOLECYSTECTOMY  03/18/2000   RECONSTRUCTION OF EYELID Bilateral 08/2022   RIGHT/LEFT HEART CATH AND CORONARY ANGIOGRAPHY Bilateral 11/25/2023   Procedure: RIGHT/LEFT HEART CATH AND CORONARY ANGIOGRAPHY;   Surgeon: Florencio Cara BIRCH, MD;  Location: [facility];  Service: Cardiovascular;  Laterality: Bilateral;   TRANSTHORACIC ECHOCARDIOGRAM  02/23/2011   normal lvf/  ef 58%/  mild rve/   mild lae/  mild  mr  & tr/  mild pulmonary htn   VULVECTOMY N/A 12/25/2022   Procedure: PARTIAL  VULVECTOMY;  Surgeon: Eldonna Mays, MD;  Location: [facility];  Service: Gynecology;  Laterality: N/A;   VULVECTOMY N/A 02/12/2023   Procedure: WIDE LOCAL EXCISION OF VULVA AND REMOVAL OF CLITORIS;  Surgeon: Eldonna Mays, MD;  Location: [facility];  Service: Gynecology;  Laterality: N/A;  WISDOM TOOTH EXTRACTION     Social History   Tobacco Use   Smoking status: Never   Smokeless tobacco: Never  Vaping Use   Vaping status: Never Used  Substance Use Topics   Alcohol use: Yes    Comment: Only on cruises or rarely   Drug use: No   Family History  Problem Relation Age of Onset   Stroke Mother    Heart disease Mother    Hypertension Father    Diabetes Father    Hyperlipidemia Father    Breast cancer Sister    Breast cancer Sister    Diabetes Brother    Colon cancer Neg Hx    Ovarian cancer Neg Hx    Endometrial cancer Neg Hx    Pancreatic cancer Neg Hx    Prostate cancer Neg Hx    Allergies  Allergen Reactions   Ingrezza [Valbenazine Tosylate] Other (See Comments)    Night sweats, worsened tardive dyskinesia, nightmares   Lidocaine  Swelling    ALL CAINE DRUGS EXCEPT: marcaine  & sensercaine    Xylocaine  [Lidocaine  Hcl] Swelling    ALLERGIC TO ALL CAINES EXCEPT SENSORCAINE  , AND MARCAINE    Albuterol Other (See Comments)    Leg swelling   Amantadine     Muscle pain and tremors    Austedo Xr [Deutetrabenazine]     Caused Muscle aches and tremors   Codeine Nausea And Vomiting    REACTION: vomits blood   Lithium Swelling   Montelukast Swelling    Leg swelling   Tegretol [Carbamazepine] Other (See Comments)    skin crawls   Current Outpatient  Medications on File Prior to Visit  Medication Sig Dispense Refill   acetaminophen  (TYLENOL ) 650 MG CR tablet Take 1,300 mg by mouth every 8 (eight) hours as needed for pain.     apixaban (ELIQUIS) 5 MG TABS tablet Take 5 mg by mouth 2 (two) times daily.     ARIPiprazole  (ABILIFY ) 10 MG tablet Take 10 mg by mouth at bedtime.     aspirin  EC 81 MG tablet Take 1 tablet (81 mg total) by mouth daily. Swallow whole. 30 tablet 0   benzonatate  (TESSALON ) 100 MG capsule Take 100 mg by mouth 3 (three) times daily as needed for cough.     buPROPion  (WELLBUTRIN  XL) 300 MG 24 hr tablet Take 300 mg by mouth daily.     cetirizine (ZYRTEC ALLERGY) 10 MG tablet Take 10 mg by mouth daily.     cholecalciferol (VITAMIN D3) 25 MCG (1000 UNIT) tablet Take 1,000 Units by mouth daily.     clonazePAM  (KLONOPIN ) 1 MG tablet Take 1 mg by mouth at bedtime.     famotidine  (PEPCID ) 20 MG tablet TAKE 1 TABLET TWICE DAILY 180 tablet 0   fluticasone  (FLONASE ) 50 MCG/ACT nasal spray Place 2 sprays into both nostrils daily.     guaifenesin (ROBITUSSIN) 100 MG/5ML syrup Take 100 mg by mouth 2 (two) times daily as needed for cough.     lamoTRIgine  (LAMICTAL ) 200 MG tablet Take 200 mg by mouth at bedtime.     levothyroxine  (SYNTHROID ) 50 MCG tablet TAKE 1 TABLET EVERY DAY BEFORE BREAKFAST 90 tablet 2   lisinopril  (ZESTRIL ) 2.5 MG tablet Take 1 tablet (2.5 mg total) by mouth daily. 30 tablet 0   lovastatin  (MEVACOR ) 40 MG tablet TAKE 1 TABLET EVERY DAY (Patient taking differently: Take 40 mg by mouth at bedtime.) 90 tablet 2   nystatin  (MYCOSTATIN /NYSTOP ) powder Apply 1 Application topically 3 (three)  times daily. Under abdomen skin fold 15 g 2   torsemide (DEMADEX) 20 MG tablet Take 20 mg by mouth daily with supper.     zolpidem  (AMBIEN ) 10 MG tablet Take 10 mg by mouth at bedtime as needed for sleep.     No current facility-administered medications on file prior to visit.    Review of Systems  Constitutional:  Positive for  fatigue. Negative for activity change, appetite change, fever and unexpected weight change.  HENT:  Negative for congestion, ear pain, rhinorrhea, sinus pressure and sore throat.   Eyes:  Negative for pain, redness and visual disturbance.  Respiratory:  Positive for shortness of breath. Negative for cough, choking, chest tightness, wheezing and stridor.   Cardiovascular:  Negative for chest pain and palpitations.  Gastrointestinal:  Negative for abdominal pain, blood in stool, constipation and diarrhea.  Endocrine: Negative for polydipsia and polyuria.  Genitourinary:  Negative for dysuria, frequency and urgency.  Musculoskeletal:  Negative for arthralgias, back pain and myalgias.  Skin:  Negative for pallor and rash.  Allergic/Immunologic: Negative for environmental allergies.  Neurological:  Negative for dizziness, syncope and headaches.  Hematological:  Negative for adenopathy. Does not bruise/bleed easily.  Psychiatric/Behavioral:  Positive for dysphoric mood. Negative for decreased concentration. The patient is not nervous/anxious.        Objective:   Physical Exam Constitutional:      General: She is not in acute distress.    Appearance: Normal appearance. She is well-developed. She is obese. She is not ill-appearing or diaphoretic.  HENT:     Head: Normocephalic and atraumatic.     Mouth/Throat:     Mouth: Mucous membranes are moist.     Pharynx: Oropharynx is clear.  Eyes:     General: No scleral icterus.       Right eye: No discharge.        Left eye: No discharge.     Conjunctiva/sclera: Conjunctivae normal.     Pupils: Pupils are equal, round, and reactive to light.  Neck:     Thyroid : No thyromegaly.     Vascular: No carotid bruit or JVD.  Cardiovascular:     Rate and Rhythm: Normal rate and regular rhythm.     Heart sounds: Normal heart sounds.     No gallop.  Pulmonary:     Effort: Pulmonary effort is normal. No respiratory distress.     Breath sounds: Normal  breath sounds. No stridor. No wheezing, rhonchi or rales.     Comments: Good air exch Not short of breath with speech Abdominal:     General: There is no distension or abdominal bruit.     Palpations: Abdomen is soft.  Musculoskeletal:     Cervical back: Normal range of motion and neck supple.     Right lower leg: No edema.     Left lower leg: No edema.  Lymphadenopathy:     Cervical: No cervical adenopathy.  Skin:    General: Skin is warm and dry.     Coloration: Skin is not pale.     Findings: No rash.  Neurological:     Mental Status: She is alert.     Cranial Nerves: No cranial nerve deficit.     Motor: No weakness.     Coordination: Coordination normal.     Deep Tendon Reflexes: Reflexes are normal and symmetric. Reflexes normal.  Psychiatric:        Mood and Affect: Mood is depressed.  Cognition and Memory: Cognition normal.     Comments: Candidly discusses symptoms and stressors             Assessment & Plan:   Problem List Items Addressed This Visit       Other   Prediabetes   Lab Results  Component Value Date   HGBA1C 5.7 (A) 12/16/2023   Unchanged disc imp of low glycemic diet and wt loss to prevent DM2       Relevant Orders   POCT HgB A1C (Completed)   Morbid obesity (HCC) - Primary   Discussed how this problem influences overall health and the risks it imposes  Reviewed plan for weight loss with lower calorie diet (via better food choices (lower glycemic and portion control) along with exercise building up to or more than 30 minutes 5 days per week including some aerobic activity and strength training   42.22 kg/m  Pt is having difficulty exercising with recent shortness of breath which may be weight related  Likely OSA-pending sleep study Prediabetic  Lab Results  Component Value Date   HGBA1C 5.7 (A) 12/16/2023   Psychiatric medications including abilify  and lamictal  making it different to loose   Encouraged low glycemic diet   Encouraged strength training if able   Planning to continue weight watchers Interested in GLP-1 med Disc option of GLP medication including possible side effects like GI intolerance and risk of thyroid  and endocrine cancer, pancreatitis and gallstones, kidney problems and diabetic retinopathy  Prescription for wegovy  sent to pharmacy -will not likely be covered If so she may pursue this through [employer] program         Relevant Medications   Semaglutide -Weight Management 0.25 MG/0.5ML SOAJ   Other Relevant Orders   POCT HgB A1C (Completed)   Dyspnea   With normal 02 sat in setting of morbid obesity, mild pulmonary HTN and mild reactive airways Also LE edema Likely OSA (pending sleep study)  Continues follow up with cardiology and pulmonary  Torsemide started by nephrology-to start tomorrow am/hoping this helps        Bipolar disorder, in partial remission, most recent episode mixed (HCC)   Under psychiatric care for this  Both abilify  and lamictal  may add to difficulty losing weight

## 2023-12-16 NOTE — Assessment & Plan Note (Signed)
 Lab Results  Component Value Date   HGBA1C 5.7 (A) 12/16/2023   Unchanged disc imp of low glycemic diet and wt loss to prevent DM2

## 2023-12-16 NOTE — Telephone Encounter (Signed)
 PCP will order labs closer to appt time

## 2023-12-16 NOTE — Telephone Encounter (Signed)
 See note on Wegovy it says med isn't on Formulary plan (non Formulary)

## 2023-12-16 NOTE — Patient Instructions (Addendum)
 Stick with [employer] watchers   I will send a prescription for generic wegovy  to your pharmacy - let's see if this gets covered  If not- you may consider trying the pharmaceutical program with weight watchers   Continue follow up with cardiology and pulmonary Get your sleep study   Add some strength training to your routine, this is important for bone and brain health and can reduce your risk of falls and help your body use insulin properly and regulate weight  Light weights, exercise bands , and internet videos are a good way to start  Yoga (chair or regular), machines , floor exercises or a gym with machines are also good options   Try to get most of your carbohydrates from produce (with the exception of white potatoes) and whole grains Eat less bread/pasta/rice/snack foods/cereals/sweets and other items from the middle of the grocery store (processed carbs)  Make sure you get enough protein  The following are examples of protein in diet  Meat  Fish  Eggs  Dairy products  Soy products  Oat milk  Almond milk Nuts and nut butters  Dried beans    Start the torsemide as planned -I hope it helps

## 2023-12-19 NOTE — Telephone Encounter (Signed)
Sent mychart letting pt know Dr. Tower's comments. 

## 2023-12-20 ENCOUNTER — Ambulatory Visit: Payer: Medicare HMO | Admitting: Psychology

## 2023-12-23 ENCOUNTER — Encounter: Payer: Self-pay | Admitting: Psychiatry

## 2023-12-23 ENCOUNTER — Inpatient Hospital Stay: Payer: Medicare HMO | Attending: Gynecologic Oncology | Admitting: Psychiatry

## 2023-12-23 ENCOUNTER — Other Ambulatory Visit (HOSPITAL_COMMUNITY)
Admission: RE | Admit: 2023-12-23 | Discharge: 2023-12-23 | Disposition: A | Discharge status: Home / Self Care | Payer: Medicare HMO | Source: Ambulatory Visit | Attending: Psychiatry | Admitting: Psychiatry

## 2023-12-23 VITALS — BP 140/68 | HR 63 | Temp 98.0°F | Resp 19 | Wt 215.8 lb

## 2023-12-23 DIAGNOSIS — N898 Other specified noninflammatory disorders of vagina: Secondary | ICD-10-CM

## 2023-12-23 DIAGNOSIS — D038 Melanoma in situ of other sites: Secondary | ICD-10-CM | POA: Diagnosis not present

## 2023-12-23 DIAGNOSIS — Z8544 Personal history of malignant neoplasm of other female genital organs: Secondary | ICD-10-CM | POA: Diagnosis not present

## 2023-12-23 DIAGNOSIS — I480 Paroxysmal atrial fibrillation: Secondary | ICD-10-CM | POA: Diagnosis not present

## 2023-12-23 DIAGNOSIS — N9089 Other specified noninflammatory disorders of vulva and perineum: Secondary | ICD-10-CM

## 2023-12-23 DIAGNOSIS — Z9079 Acquired absence of other genital organ(s): Secondary | ICD-10-CM | POA: Diagnosis not present

## 2023-12-23 DIAGNOSIS — C519 Malignant neoplasm of vulva, unspecified: Secondary | ICD-10-CM

## 2023-12-23 NOTE — Patient Instructions (Signed)
 It was a pleasure to see you in clinic today. - I will le you know regarding biopsy results - Return visit planned for 3 months unless indicated sooner by biopsies  Thank you very much for allowing me to provide care for you today.  I appreciate your confidence in choosing our Gynecologic Oncology team at [facility].  If you have any questions about your visit today please call our office or send us  a MyChart message and we will get back to you as soon as possible.

## 2023-12-23 NOTE — Progress Notes (Addendum)
 Gynecologic Oncology Return Clinic Visit  Date of Service: 12/23/2023 Referring Provider: Harland Birkenhead, MD   Assessment & Plan: [name] [name] is a 68 y.o. woman with vulvar melanoma in situ who is s/p wide local excision of the vulva on 12/25/22, followed by re-excision and removal of clitoris on 02/12/23, who presents today for surveillance.  Melanoma in situ: - Focal positive deep margin (one focal spot on 1 single slide). All other margins negative for MIS but positive for atypical lentiginous melanocytic hyperplasia (possible precursor lesion). - Two very small areas on exam today near scar anteriorly with brown appearance. Biopsies obtained (see below). Will follow-up results. - Continue surveillance with pelvic exam q61mo with scouting biopsies/diagnostic biopsies as indicated. If skin changes, positive biopsies, could consider treatment with aldara. - Once 1 year NED, may be able to reduce follow-up to q30mo.  Vaginal discharge: - Pt report of vaginal odor, discharge. - Swab collected today. Will follow-up results and treat if indicated.   RTC 7mo  Hoy Masters, MD Gynecologic Oncology   Medical Decision Making I personally spent  TOTAL 25 minutes face-to-face and non-face-to-face in the care of this patient, which includes all pre, intra, and post visit time on the date of service.   ----------------------- Reason for Visit: Surveillance  Treatment History: Pt presented to her Ob/Gyn for evaluation of vulvar itching. She had noted itching the week prior, but this resolved with application of clobetasol  which she had for a diagnosis of lichen simplex chronicus. However, patient had also been previously noted by another provider to have a brown vulvar lesions of her right labia minora 12/2021 which was rechecked 03/2022 and noted to be stable with plan for continued yearly follow-up.   On 11/12/22, her Ob/Gyn noted a 1cm brown lesion was noted at the top of her labia minora.  Given the appearance of a melanotic lesion, she was recommended to return for a biopsy, which was completed on 11/20/22. Biopsy returned with melanoma in situ, edges involved. No invasive disease was seen.   She underwent a simple partial vulvectomy on 12/25/22 with path results: A. ANTERIOR VULVA, EXCISION: MELANOMA IN SITU, EXTENDING TO THE DEEP  SURGICAL MARGIN. THE PERIPHERAL MARGINS ARE NEGATIVE.  B. ANTERIOR VULVA, LEFT OF CLITORAL HOOD, EXCISION:  MELANOMA IN SITU  INVOLVING THE 12, 3, 6 AND 9 O'CLOCK MARGINS.   Given multiple positive margins, she returned to the OR on 02/12/23 for additional resection including a WLE of the anterior vulva including removal of remaining bilateral labia minora and removal of clitoris with pathology showing: A.   VULVA, ANTERIOR MARGIN, BIOPSY: NO MELANOMA IN SITU SEEN. COMMENT: Dr. Lorriane is in agreement. B.   VULVA, LEFT ANTERIOR MARGIN, BIOPSY: NO MELANOMA IN SITU SEEN. COMMENT: Dr. Lorriane is in agreement. C.   VULVA, LEFT MEDIAL MARGIN, BIOPSY: NO MELANOMA IN SITU SEEN. COMMENT: MelanA was used to evaluate this slide. Dr. Lorriane is in agreement D.   **PRELIMINARY DIAGNOSIS** VULVA, ANTERIOR WITH LEFT/RIGHT LABIA MINORA, EXCISION: -RESIDUAL MELANOMA IN SITU , MULTFOCAL RESIDUAL FOCI , INVOLVING BLACK/DEEP MARGIN (D2), -BACKGROUND ATYPICAL JUNCTIONAL MELANOCYTIC HYPERPLASIA INVOLVING MULTIFOCAL MARGINS (ANTERIOR, OUTER LEFT, INNER LEFT MARGINS INVOLVED WITH ATYPICAL JUNCTIONAL MELANOCYTIC HYPERPLASIA, SEE COMMENT) COMMENT: Sections show extensive areas of atypical lentiginous melanocytic hyperplasia involving the margins noted above, which may be a precursor lesion to the residual melanoma in situ present. The atypical lentiginous melanocytic hyperplasia does not meet criteria for melanoma in situ. These atypical lentiginous foci are negative with PRAME, and  exhibit lesser cytological atypia on MITF staining. MelanA overstains many foci and is not  contributory. However, residual melanoma in situ with focal upward (pagetoid) growth, severe atypia of melanocytes, and melanocytic confluence is seen in multifocal areas, but notably ONLY at the margin deep inked black (block D2). The remainder of the margins are free of melanoma in situ.  E.   VULVA, LEFT LATERAL MARGIN, EXCISION: ATYPICAL JUNCTIONAL MELANOCYTIC HYPERPLASIA, NO MELANOMA IN SITU COMMENT: No melanoma in situ is seen. PRAME, MelanA, and MITF were used to evaluate this margin. Dr. Lorriane has reviewed this case and is in agreement with the diagnoses rendered above. In managing this patient with extensive atypical lentiginous melanocytic hyperplasia, which likely represents a confluence of extensive melanotic macules and precursor disease to melanoma in situ, this reference article is provided: Oneda Betty HERO. May P. Candyce, Shitanshu Uppai: Margin status in vulvovaginal melanoma: Management and oncologic outcomes of 50 cases. Gynecol Oncol Rep. 2023 Oct; 49: [id number]   Scouting biopsies on 06/03/23: negative Scouting biopsies 10/14/254: lichenoid dermatitis   Interval History: Patient presents with her husband today.  She reports that she has had a lot of other medical care going on.  She was taking diet pills and experienced chest pain.  She had presented to the emergency room and was admitted.  She underwent a nuclear stress test and heart catheterization.  She has additional follow-up with cardiology and pulmonology planned.  She otherwise denies any vaginal bleeding, vulvar itching, or new lesions.  She does report that she feels that she has been having some vaginal discharge for about a month and a mineral/metallic odor.    Past Medical/Surgical History: Past Medical History:  Diagnosis Date   Actinic keratosis 03/29/2021   R central upper lip   Allergic rhinitis    Allergy    Anemia 10/29/2023   Anxiety    Arthritis    Atypical mole 12/26/2021   spinal  upper back, exc 01/15/22   Basal cell carcinoma 10/16/2010   R lower pretibia    Bipolar 1 disorder (HCC)    Depression    Fissure, anal    GERD (gastroesophageal reflux disease)    History of basal cell carcinoma excision    ON FACE   History of squamous cell carcinoma excision    ON RIGHT LEG   Hyperlipidemia    Hypothyroidism    IBS (irritable bowel syndrome)    Liver cyst    Malignant melanoma in situ (HCC) 11/20/2022   Right labia minora. Excision 12/25/2022 at anterior vulva, left of clitoral hood, margins involved. Surgery scheduled for 02/12/2023.   Mallet finger of left hand    small finger   NSTEMI (non-ST elevated myocardial infarction) (HCC) 10/22/2023   Pre-diabetes     Past Surgical History:  Procedure Laterality Date   ABLATION     CARDIAC CATHETERIZATION  04/10/1999   per pt normal   CARDIOVASCULAR STRESS TEST  02-23-2011  dr vinie fath [facility])   normal nuclear study/  no ischemia/  ef 64%   CATARACT EXTRACTION W/ INTRAOCULAR LENS  IMPLANT, BILATERAL  12/10/2001   Bil   CESAREAN SECTION  12/10/1982   CLOSED REDUCTION FINGER WITH PERCUTANEOUS PINNING Left 04/22/2014   Procedure: LEFT SMALL FINGER CLOSED REDUCTION PINNING;  Surgeon: Prentice LELON Pagan, MD;  Location: [facility];  Service: Orthopedics;  Laterality: Left;   COLONOSCOPY  11/09/2002   DILATION AND CURETTAGE OF UTERUS  1977   LAPAROSCOPIC CHOLECYSTECTOMY  03/18/2000  RECONSTRUCTION OF EYELID Bilateral 08/2022   RIGHT/LEFT HEART CATH AND CORONARY ANGIOGRAPHY Bilateral 11/25/2023   Procedure: RIGHT/LEFT HEART CATH AND CORONARY ANGIOGRAPHY;  Surgeon: Florencio Cara BIRCH, MD;  Location: [facility];  Service: Cardiovascular;  Laterality: Bilateral;   TRANSTHORACIC ECHOCARDIOGRAM  02/23/2011   normal lvf/  ef 58%/  mild rve/   mild lae/  mild  mr  & tr/  mild pulmonary htn   VULVECTOMY N/A 12/25/2022   Procedure: PARTIAL  VULVECTOMY;  Surgeon: Eldonna Mays, MD;   Location: [facility] [place];  Service: Gynecology;  Laterality: N/A;   VULVECTOMY N/A 02/12/2023   Procedure: WIDE LOCAL EXCISION OF VULVA AND REMOVAL OF CLITORIS;  Surgeon: Eldonna Mays, MD;  Location: [facility] [place];  Service: Gynecology;  Laterality: N/A;   WISDOM TOOTH EXTRACTION      Family History  Problem Relation Age of Onset   Stroke Mother    Heart disease Mother    Hypertension Father    Diabetes Father    Hyperlipidemia Father    Breast cancer Sister    Breast cancer Sister    Diabetes Brother    Colon cancer Neg Hx    Ovarian cancer Neg Hx    Endometrial cancer Neg Hx    Pancreatic cancer Neg Hx    Prostate cancer Neg Hx     Social History   Socioeconomic History   Marital status: Married    Spouse name: Not on file   Number of children: 1   Years of education: Not on file   Highest education level: Bachelor's degree (e.g., BA, AB, BS)  Occupational History   Occupation: Home    Employer: RETIRED  Tobacco Use   Smoking status: Never   Smokeless tobacco: Never  Vaping Use   Vaping status: Never Used  Substance and Sexual Activity   Alcohol use: Yes    Comment: Only on cruises or rarely   Drug use: No   Sexual activity: Not Currently    Birth control/protection: Post-menopausal  Other Topics Concern   Not on file  Social History Narrative   Moved here from [place]   Social Drivers of Health   Financial Resource Strain: Low Risk  (12/16/2023)   Overall Financial Resource Strain (CARDIA)    Difficulty of Paying Living Expenses: Not hard at all  Food Insecurity: No Food Insecurity (12/16/2023)   Hunger Vital Sign    Worried About Running Out of Food in the Last Year: Never true    Ran Out of Food in the Last Year: Never true  Transportation Needs: No Transportation Needs (12/16/2023)   PRAPARE - Administrator, Civil Service (Medical): No    Lack of Transportation (Non-Medical): No  Physical Activity: Sufficiently Active  (12/16/2023)   Exercise Vital Sign    Days of Exercise per Week: 3 days    Minutes of Exercise per Session: 60 min  Stress: No Stress Concern Present (12/16/2023)   [employer] of Occupational Health - Occupational Stress Questionnaire    Feeling of Stress : Only a little  Recent Concern: Stress - Stress Concern Present (11/08/2023)   [employer] of Occupational Health - Occupational Stress Questionnaire    Feeling of Stress : To some extent  Social Connections: Socially Integrated (12/16/2023)   Social Connection and Isolation Panel [NHANES]    Frequency of Communication with Friends and Family: Three times a week    Frequency of Social Gatherings with Friends and Family: Once  a week    Attends Religious Services: More than 4 times per year    Active Member of Clubs or Organizations: No    Attends Engineer, Structural: More than 4 times per year    Marital Status: Married    Current Medications:  Current Outpatient Medications:    acetaminophen  (TYLENOL ) 650 MG CR tablet, Take 1,300 mg by mouth every 8 (eight) hours as needed for pain., Disp: , Rfl:    apixaban (ELIQUIS) 5 MG TABS tablet, Take 5 mg by mouth 2 (two) times daily., Disp: , Rfl:    ARIPiprazole  (ABILIFY ) 10 MG tablet, Take 10 mg by mouth at bedtime., Disp: , Rfl:    aspirin  EC 81 MG tablet, Take 1 tablet (81 mg total) by mouth daily. Swallow whole., Disp: 30 tablet, Rfl: 0   benzonatate  (TESSALON ) 100 MG capsule, Take 100 mg by mouth 3 (three) times daily as needed for cough., Disp: , Rfl:    buPROPion  (WELLBUTRIN  XL) 300 MG 24 hr tablet, Take 300 mg by mouth daily., Disp: , Rfl:    cetirizine (ZYRTEC ALLERGY) 10 MG tablet, Take 10 mg by mouth daily., Disp: , Rfl:    cholecalciferol (VITAMIN D3) 25 MCG (1000 UNIT) tablet, Take 1,000 Units by mouth daily., Disp: , Rfl:    clonazePAM  (KLONOPIN ) 1 MG tablet, Take 1 mg by mouth at bedtime., Disp: , Rfl:    famotidine  (PEPCID ) 20 MG tablet, TAKE 1 TABLET TWICE  DAILY, Disp: 180 tablet, Rfl: 0   fluticasone  (FLONASE ) 50 MCG/ACT nasal spray, Place 2 sprays into both nostrils daily., Disp: , Rfl:    guaifenesin (ROBITUSSIN) 100 MG/5ML syrup, Take 100 mg by mouth 2 (two) times daily as needed for cough., Disp: , Rfl:    lamoTRIgine  (LAMICTAL ) 200 MG tablet, Take 200 mg by mouth at bedtime., Disp: , Rfl:    levothyroxine  (SYNTHROID ) 50 MCG tablet, TAKE 1 TABLET EVERY DAY BEFORE BREAKFAST, Disp: 90 tablet, Rfl: 2   lisinopril  (ZESTRIL ) 2.5 MG tablet, Take 1 tablet (2.5 mg total) by mouth daily., Disp: 30 tablet, Rfl: 0   lovastatin  (MEVACOR ) 40 MG tablet, TAKE 1 TABLET EVERY DAY (Patient taking differently: Take 40 mg by mouth at bedtime.), Disp: 90 tablet, Rfl: 2   nystatin  (MYCOSTATIN /NYSTOP ) powder, Apply 1 Application topically 3 (three) times daily. Under abdomen skin fold, Disp: 15 g, Rfl: 2   Semaglutide -Weight Management 0.25 MG/0.5ML SOAJ, Inject 0.25 mg into the skin once a week., Disp: 2 mL, Rfl: 0   torsemide (DEMADEX) 20 MG tablet, Take 20 mg by mouth daily with supper., Disp: , Rfl:    zolpidem  (AMBIEN ) 10 MG tablet, Take 10 mg by mouth at bedtime as needed for sleep., Disp: , Rfl:   Review of Symptoms: Complete 10-system review is positive for: Shortness of breath, urinary frequency, dizziness, leg swelling  Physical Exam: BP (!) 140/68 (BP Location: Left Arm, Patient Position: Sitting) Comment: Notified RN  Pulse 63   Temp 98 F (36.7 C) (Oral)   Resp 19   Wt 215 lb 12.8 oz (97.9 kg)   SpO2 97%   BMI 40.11 kg/m  General: Alert, oriented, no acute distress. HEENT: Normocephalic, atraumatic. Neck symmetric without masses. Sclera anicteric.  Chest: Normal work of breathing.  Abdomen: Soft, nontender.  Extremities: Grossly normal range of motion.  Warm, well perfused.  Edema bilateral ankles Skin: No rashes or lesions noted. Lymphatics: No supraclavicular, cervical or inguinal adenopathy. GU: External genitalia with evidence of prior  anterior vulvectomy, well healed. Two 1-81mm areas of anterior midline vulva near prior scar with slight brown appearance, biopsies of these two areas. See below.  Speculum exam with small yellowish vaginal discharge, swab collected. Exam chaperoned by Eleanor Epps, NP  Physical Exam Genitourinary:     VULVAR BIOPSY  KINBERLY PERRIS is a 68 y.o. woman who presents today for a vulvar biopsy, see details of the visit above.  The procedure was explained to the patient and verbal consent was obtained prior to the procedure.  The skin was cleaned with Betadine.  1 ml of 0.25% marcaine  was injected at the planned biopsy sites.  A 3 mm punch biopsy was used to obtain one biopsy left anterior vulva, and one biopsy of the midline proximal anterior vulva (see red areas on diagram above).  Hemostasis was achieved with silver nitrate.  Patient tolerated the procedure well.   Laboratory & Radiologic Studies: Surgical pathology (09/23/23): A.   VULVA, RIGHT ANTERIOR, BIOPSY:  - LICHENOID DERMATITIS, SEE NOTE:   NOTE: The findings can be seen in lichen planus or a lichenoid drug  eruption. A  melanocytic proliferation is not identified. PAS stain is  negative for fungal elements.   B. VULVA, LEFT ANTERIOR, BIOPSY:  - LICHENOID DERMATITIS, SEE NOTE:   NOTE: The findings can be seen in lichen planus or a lichenoid drug  eruption. A melanocytic proliferation is not identified. PAS stain is  negative for fungal elements.  Negative for dysplasia and diagnostic viral change

## 2023-12-23 NOTE — Addendum Note (Signed)
 Addended by: [name] on: 12/23/2023 03:52 PM   Modules accepted: Orders

## 2023-12-24 DIAGNOSIS — R809 Proteinuria, unspecified: Secondary | ICD-10-CM | POA: Diagnosis not present

## 2023-12-24 DIAGNOSIS — I129 Hypertensive chronic kidney disease with stage 1 through stage 4 chronic kidney disease, or unspecified chronic kidney disease: Secondary | ICD-10-CM | POA: Diagnosis not present

## 2023-12-24 DIAGNOSIS — N1832 Chronic kidney disease, stage 3b: Secondary | ICD-10-CM | POA: Diagnosis not present

## 2023-12-24 DIAGNOSIS — R8290 Unspecified abnormal findings in urine: Secondary | ICD-10-CM | POA: Diagnosis not present

## 2023-12-24 DIAGNOSIS — R600 Localized edema: Secondary | ICD-10-CM | POA: Diagnosis not present

## 2023-12-24 DIAGNOSIS — N1831 Chronic kidney disease, stage 3a: Secondary | ICD-10-CM | POA: Diagnosis not present

## 2023-12-25 LAB — CERVICOVAGINAL ANCILLARY ONLY
Bacterial Vaginitis (gardnerella): NEGATIVE
Candida Glabrata: NEGATIVE
Candida Vaginitis: NEGATIVE
Comment: NEGATIVE
Comment: NEGATIVE
Comment: NEGATIVE
Comment: NEGATIVE
Trichomonas: NEGATIVE

## 2023-12-31 ENCOUNTER — Telehealth: Payer: Self-pay | Admitting: Psychiatry

## 2023-12-31 DIAGNOSIS — N1831 Chronic kidney disease, stage 3a: Secondary | ICD-10-CM | POA: Insufficient documentation

## 2023-12-31 DIAGNOSIS — I129 Hypertensive chronic kidney disease with stage 1 through stage 4 chronic kidney disease, or unspecified chronic kidney disease: Secondary | ICD-10-CM | POA: Diagnosis not present

## 2023-12-31 DIAGNOSIS — R600 Localized edema: Secondary | ICD-10-CM | POA: Insufficient documentation

## 2023-12-31 DIAGNOSIS — I272 Pulmonary hypertension, unspecified: Secondary | ICD-10-CM | POA: Insufficient documentation

## 2023-12-31 LAB — SURGICAL PATHOLOGY

## 2023-12-31 NOTE — Telephone Encounter (Signed)
Called pt with biopsy results. Given one with melanoma in situ, recommend reexcision. Will have clinic call to schedule follow-up visit and RN/NP preop. All questions answered.

## 2024-01-07 ENCOUNTER — Ambulatory Visit (INDEPENDENT_AMBULATORY_CARE_PROVIDER_SITE_OTHER): Payer: Medicare HMO | Admitting: Psychology

## 2024-01-07 DIAGNOSIS — F3181 Bipolar II disorder: Secondary | ICD-10-CM

## 2024-01-07 DIAGNOSIS — F4320 Adjustment disorder, unspecified: Secondary | ICD-10-CM | POA: Diagnosis not present

## 2024-01-07 NOTE — Progress Notes (Signed)
[facility] Counselor/Therapist Progress Note  Patient ID: [name] [name], MRN: [id number],    Date: 01/07/2024  Time Spent: 50 minutes  Time in: 2:03 Time out 2: 53  Treatment Type: Individual Therapy  Reported Symptoms: none  Mental Status Exam: Appearance:  Casual     Behavior: Appropriate  Motor: Normal  Speech/Language:  normal  Affect: Appropriate  Mood: sad  Thought process: normal  Thought content:   WNL  Sensory/Perceptual disturbances:   WNL  Orientation: oriented to person, place, time/date, and situation  Attention: Good  Concentration: Good  Memory: WNL  Fund of knowledge:  Good  Insight:   Good  Judgment:  Good  Impulse Control: Good   Risk Assessment: Danger to Self:  No Self-injurious Behavior: No Danger to Others: No Duty to Warn:no Physical Aggression / Violence:No  Access to Firearms a concern: No  Gang Involvement:No   Subjective: The patient attended a face-to-face individual therapy session via video visit today.  The patient agreed and consented for this session to be on caregility and patient is aware of limitations of telehealth.  The patient was in her home alone and the therapist was in the office.  The patient presents with a blunted affect and mood is sad.  The patient reports that she has had a difficult month.  She states that her cancer has recurred and she is going to have to have surgery on that.  In addition her husband is going to have to have a pacemaker put in but does not correct anything but that will just keep him alive a little longer.  In addition she has to have some sort of procedure on her heart on Friday and this is causing her some angst as well.  We talked about the things she has coming up in the near future and she was planning to go with her husband down to [facility] on April 1 to see her granddaughter in a play.  We talked about the possibility that that may not happen and I recommended that she talk to her  doctors about the travel and that her husband talked to her his doctors about the travel.  I also recommended that if they are going to travel we talked about them getting wheelchair assistance and assistance with her bags.  In addition she is very sad if she is not going to be able to go see her granddaughter in this play.  I also explained to her that she may not want to be far away from her doctors since they are having medical issues.  The patient was tearful and sad during this conversation and then she was talking about how she felt like she was being tested by God.  I explained that I was sure that he was testing her but that these were just things that were life things that sometimes happen to [place] as we age and that when she thinks that she is being tested for sort of sounds like she feels that it is a punishment.  I tried to reframe that for her so that she is not looking at it in that way.  We scheduled an appointment in the next few weeks and she knows that she can contact me if she needs to for any reason.  Interventions: Cognitive Behavioral Therapy, meditation and mindfulness  Diagnosis:Bipolar II disorder (HCC)  Anticipatory grief  Plan: Client Abilities/Strengths  intelligent, insightful  Client Treatment Preferences  Outpatient individual therapy  Client  Statement of Needs  "I need someone to help monitor and manage my bipolar"  Treatment Level  Outpatient individual therapy  Symptoms  Depressed or irritable mood.: (Status: improved). Diminished interest in or  enjoyment of activities.: (Status: improved). Displays a poor attention span and is easily distracted.:  (Status: improved). Exhibits an abnormally and  persistently elevated, expansive, or irritable mood with at least three symptoms of mania (i.e., inflated  self-esteem or grandiosity, decreased need for sleep, pressured speech, flight of ideas, distractibility,  excessive goal-directed activity or psychomotor agitation,  excessive involvement in pleasurable, highrisk behavior).:  (Status: improved). Feelings of hopelessness, worthlessness, or inappropriate guilt.: (Status: improved). History of at least one hypomanic,  manic, or mixed mood episode.:  (Status: maintained). History of chronic or  recurrent depression for which the client has taken antidepressant medication, been hospitalized, had  outpatient treatment, or had a course of electroconvulsive therapy.:  (Status:  maintained). Lack of energy.: (Status: improved). Lacks follow-through in  projects, even though energy is very high, since behavior lacks discipline and goal-directedness.:  (Status: improved). Poor concentration and indecisiveness.:  (Status: improved). Reports flight of ideas or thoughts racing.: (Status: improved). Shows evidence of a decreased need for sleep.: (Status: improved).  Sleeplessness or hypersomnia.:  (Status: improved). Social withdrawal.: (Status: improved). Suicidal thoughts and/or gestures.:   (Status: improved). The elevated mood or irritability (mania) causes marked impairment in  occupational functioning, social activities, or relationships with others.: (Status: improved). Verbalizes grandiose ideas and/or persecutory beliefs.:  (Status: improved).  Problems Addressed  Bipolar Disorder - Mania, Bipolar Disorder - Depression, Bipolar Disorder - Mania, Bipolar Disorder -  Mania, Bipolar Disorder - Depression, Bipolar Disorder - Depression, Bipolar Disorder - Mania  Goals 1. Achieve controlled behavior, moderated mood, more deliberative  speech and thought process, and a stable daily activity pattern. 2. Achieve controlled behavior, moderated mood, more deliberative  speech and thought process, and a stable daily activity pattern. 3. Alleviate manic/hypomanic mood and return to previous level of  effective functioning. 4. Develop healthy cognitive patterns and beliefs about self and the world  that lead to alleviation  and help prevent the relapse of  manic/hypomanic episodes. Objective Maintain a pattern of regular rhythm to daily activities. Target Date: 2024-05-07 Frequency: Monthly Progress: 80 Modality: individual Related Interventions 1. Assist the client in establishing a more routine pattern of daily activities such as sleeping,  eating, solitary and social activities, and exercise; use and review a form to schedule, assess,  and modify these activities so that they occur in a predictable rhythm every day. Objective Identify and replace thoughts and behaviors that trigger manic or depressive symptoms. Target Date: 2024-05-07 Frequency: Monthly Progress: 80 Modality: individual Related Interventions 1. Use cognitive therapy techniques to explore and educate the client about cognitive biases that  trigger his/her elevated or depressive mood (see Cognitive Therapy for Bipolar Disorder by  Aggie Hacker al.). 2. Teach the client cognitive-behavioral coping and relapse prevention skills including delaying  impulsive actions, structured scheduling of daily activities, keeping a regular sleep routine,  avoiding unrealistic goal striving, using relaxation procedures, identifying and avoiding episode triggers such as stimulant drug use, alcohol consumption, breaking sleep routine, or exposing  self to high stress (see Cognitive Therapy for Bipolar Disorder by Aggie Hacker al.). 3. Use cognitive therapy techniques to explore and educate the client's about cognitive biases that  trigger his/her elevated or depressive mood (see Cognitive Therapy for Bipolar Disorder by  Aggie Hacker al.). Objective Discuss and resolve troubling  personal and interpersonal issues. Target Date: 2024-05-07 Frequency: Monthly Progress: 60 Modality: individual Objective Participate in periodic "maintenance" sessions. Target Date: 2024-05-07 Frequency: Monthly Progress: 80 Modality: individual Related Interventions 1. Hold periodic  "maintenance" sessions within the first few months after therapy to facilitate the  client's positive changes; problem-solve obstacles to improvement. 2. Hold periodic "maintenance" sessions within the first few months after therapy to facilitate the  client's positive changes; problem-solve obstacles to improvement. Objective Describe mood state, energy level, amount of control over thoughts, and sleeping pattern. Target Date: 2024-05-07 Frequency: Monthly Progress: 80 Modality: individual Related Interventions 1. Encourage the client to share his/her thoughts and feelings; express empathy and build rapport  while assessing primary cognitive, behavioral, interpersonal, or other symptoms of the mood  disorder. 2. Assess presence, severity, and impact of past and present mood episodes on social,  occupational, and interpersonal functioning; supplement with semi-structured inventory, if  desired (e.g., Montgomery-Asberg Depression Rating Scale, Inventory to Diagnose  Depression). 3. Encourage the client to share his/her thoughts and feelings; express empathy, and build rapport  while assessing primary cognitive, behavioral, interpersonal, or other symptoms of the mood  disorder. 4. Assess presence, severity, and impact of past and present mood episodes including mania (i.e.,  pressured speech, impulsive behavior, euphoric mood, flight of ideas, reduced need for sleep,  inflated self-esteem, and high energy) on social, occupational, and interpersonal functioning;  supplement with semi-structured inventory, if desired (e.g., Young Mania Rating Scale; the  Clinical Monitoring Form); re-administer as indicated to assess treatment response. 5. Develop healthy cognitive patterns and beliefs about self and the world  that lead to alleviation and help prevent the relapse of mood  episodes. 6. Normalize energy level and return to usual activities, good judgment,  stable mood, more realistic  expectations, and goal-directed behavior. 7. Normalize energy level and return to usual activities, good judgment,  stable mood, more realistic expectations, and goal-directed behavior. Diagnosis 296.52 (Bipolar I disorder, most recent episode (or current) depressed, moderate)  Medications  1. Ambien (Dosage: 10mg  qhs)  2. Klonopin (Dosage: 1mg  TID)  3. Olanzapine (Dosage: 5 mg every other day and not on weekends)  4. Prozac (Dosage: 20 mg every other day and not on weekends)  5. Wellbutrin (Dosage: 450 mg qd)  Conditions For Discharge Achievement of treatment goals and objectives  The patient approved this treatment plan and is making progress towards goals.  Claribel Sachs G Edee Nifong, LCSW

## 2024-01-09 MED ORDER — SODIUM CHLORIDE 0.9 % IV SOLN: INTRAVENOUS | Status: DC

## 2024-01-10 ENCOUNTER — Ambulatory Visit: Payer: Medicare HMO | Admitting: Anesthesiology

## 2024-01-10 ENCOUNTER — Other Ambulatory Visit: Payer: Self-pay

## 2024-01-10 ENCOUNTER — Ambulatory Visit
Admission: RE | Admit: 2024-01-10 | Discharge: 2024-01-10 | Disposition: A | Discharge status: Home / Self Care | Payer: Medicare HMO | Source: Home / Self Care | Attending: Student | Admitting: Student

## 2024-01-10 ENCOUNTER — Encounter
Admission: RE | Disposition: A | Discharge status: Home / Self Care | Payer: Self-pay | Source: Home / Self Care | Attending: Cardiovascular Disease

## 2024-01-10 ENCOUNTER — Ambulatory Visit
Admission: RE | Admit: 2024-01-10 | Discharge: 2024-01-10 | Disposition: A | Discharge status: Home / Self Care | Payer: Medicare HMO | Attending: Cardiovascular Disease | Admitting: Cardiovascular Disease

## 2024-01-10 DIAGNOSIS — I10 Essential (primary) hypertension: Secondary | ICD-10-CM | POA: Diagnosis not present

## 2024-01-10 DIAGNOSIS — K219 Gastro-esophageal reflux disease without esophagitis: Secondary | ICD-10-CM | POA: Diagnosis not present

## 2024-01-10 DIAGNOSIS — I480 Paroxysmal atrial fibrillation: Secondary | ICD-10-CM | POA: Insufficient documentation

## 2024-01-10 DIAGNOSIS — I252 Old myocardial infarction: Secondary | ICD-10-CM | POA: Insufficient documentation

## 2024-01-10 DIAGNOSIS — Z7901 Long term (current) use of anticoagulants: Secondary | ICD-10-CM | POA: Diagnosis not present

## 2024-01-10 DIAGNOSIS — I4891 Unspecified atrial fibrillation: Secondary | ICD-10-CM | POA: Diagnosis not present

## 2024-01-10 DIAGNOSIS — Z8249 Family history of ischemic heart disease and other diseases of the circulatory system: Secondary | ICD-10-CM | POA: Diagnosis not present

## 2024-01-10 DIAGNOSIS — I081 Rheumatic disorders of both mitral and tricuspid valves: Secondary | ICD-10-CM | POA: Insufficient documentation

## 2024-01-10 HISTORY — PX: CARDIOVERSION: SHX1299

## 2024-01-10 HISTORY — PX: TEE WITHOUT CARDIOVERSION: SHX5443

## 2024-01-10 LAB — ECHO TEE

## 2024-01-10 SURGERY — ECHOCARDIOGRAM, TRANSESOPHAGEAL
Anesthesia: General

## 2024-01-10 MED ORDER — PROPOFOL 10 MG/ML IV BOLUS
INTRAVENOUS | Status: AC
  Filled 2024-01-10: qty 40

## 2024-01-10 MED ORDER — BUTAMBEN-TETRACAINE-BENZOCAINE 2-2-14 % EX AERO
INHALATION_SPRAY | CUTANEOUS | Status: AC
  Filled 2024-01-10: qty 5

## 2024-01-10 MED ORDER — EPHEDRINE SULFATE-NACL 50-0.9 MG/10ML-% IV SOSY
PREFILLED_SYRINGE | INTRAVENOUS | Status: DC | PRN
  Administered 2024-01-10: 10 mg via INTRAVENOUS
  Administered 2024-01-10: 5 mg via INTRAVENOUS
  Administered 2024-01-10: 10 mg via INTRAVENOUS

## 2024-01-10 MED ORDER — DEXMEDETOMIDINE HCL IN NACL 200 MCG/50ML IV SOLN
INTRAVENOUS | Status: DC | PRN
  Administered 2024-01-10: 8 ug via INTRAVENOUS

## 2024-01-10 MED ORDER — PROPOFOL 10 MG/ML IV BOLUS
INTRAVENOUS | Status: DC | PRN
  Administered 2024-01-10 (×3): 50 mg via INTRAVENOUS

## 2024-01-10 MED ORDER — EPHEDRINE 5 MG/ML INJ
INTRAVENOUS | Status: AC
  Filled 2024-01-10: qty 5

## 2024-01-10 NOTE — Progress Notes (Addendum)
Transesophageal echocardiogram preliminary report  [name] [name] [id number] 04-01-1956  Preliminary diagnosis  Atrial fibrillation with cardioversion  Postprocedural diagnosis same  Time out A timeout was performed by the nursing staff and physicians specifically identifying the procedure performed, identification of the patient, the type of sedation, all allergies and medications, all pertinent medical history, and presedation assessment of nasopharynx.  The patient and or family understand the risks of the procedure including the rare risks of death, stroke, heart attack, esophogeal perforation, sore throat, and reaction to medications given.  General anesthesia CRNA administered GA. See MAR. 150 mg propofol, 8 mcg precedex. The patient had continued monitoring of heart rate, oxygenation, blood pressure, respiratory rate, and extent of signs of sedation throughout the entire procedure.  The patient received anesthesia over a period of 20 minutes.  CRNA, nursing staff and I were present during the procedure when the patient was anesthetized for 100% of the time.  Treatment considerations No LAA thrombus See separate DCCV note Additional tx  Per primary cardiologist  For further details of transesophageal echocardiogram please refer to final report.  [facility] MD MHS [facility] 01/10/2024 12:56 PM

## 2024-01-10 NOTE — Anesthesia Postprocedure Evaluation (Signed)
Anesthesia Post Note  Patient: [name] [name]  Procedure(s) Performed: TRANSESOPHAGEAL ECHOCARDIOGRAM (TEE) CARDIOVERSION  Patient location during evaluation: Phase II Anesthesia Type: General Level of consciousness: sedated Pain management: satisfactory to patient Vital Signs Assessment: post-procedure vital signs reviewed and stable Respiratory status: respiratory function stable and patient connected to nasal cannula oxygen Cardiovascular status: stable Anesthetic complications: no   No notable events documented.   Last Vitals:  Vitals:   01/10/24 1120  BP: (!) 147/92  Pulse: (!) 59  Resp: (!) 23  Temp: 36.7 C  SpO2: 100%    Last Pain:  Vitals:   01/10/24 1120  PainSc: 0-No pain                 VAN STAVEREN,Tawny Raspberry

## 2024-01-10 NOTE — Procedures (Signed)
Electrical Cardioversion Procedure Note  Procedure: Electrical Cardioversion Indications:  Atrial Fibrillation  Procedure Details Consent: Risks of procedure as well as the alternatives and risks of each were explained to the (patient/caregiver).  Consent for procedure obtained.  Time Out Performed: Verified patient identification, verified procedure, site/side was marked, verified correct patient position, special equipment/implants available, medications/allergies/relevent history reviewed, required imaging and test results available.   Patient placed on cardiac monitor, pulse oximetry, supplemental oxygen as necessary.  Sedation given:  propofol 150 mcg, precedex 8 mcg Pacer pads placed anterior and posterior chest.  TEE was performed prior to DCCV. See separate TEE procedure note.  Cardioverted 2 time(s).  Cardioverted at 200J.  Evaluation Findings: unsuccessful cardioversion. Post procedure EKG shows: Atrial Fibrillation with slow ventricular response. Complications: None Patient did tolerate procedure well.   Tiajuana Amass MD MHS [facility] 1:05 PM 01/10/24

## 2024-01-10 NOTE — H&P (Signed)
Pre-procedure History & Physical    Patient ID: [name] [name] MRN: [id number]; DOB: [birth year]   Date of procedure: 01/10/2024  Primary Care Provider: Judy Pimple, MD Primary Cardiologist: None   Planned procedure:  TEE DCCV  HPI:   [name] [name] is a 68 y.o. female with AF s/p ablation, on Eliquis, here for TEE DCCV  Past Medical History:  Diagnosis Date   Actinic keratosis 03/29/2021   R central upper lip   Allergic rhinitis    Allergy    Anemia 10/29/2023   Anxiety    Arthritis    Atypical mole 12/26/2021   spinal upper back, exc 01/15/22   Basal cell carcinoma 10/16/2010   R lower pretibia    Bipolar 1 disorder (HCC)    Depression    Fissure, anal    GERD (gastroesophageal reflux disease)    History of basal cell carcinoma excision    ON FACE   History of squamous cell carcinoma excision    ON RIGHT LEG   Hyperlipidemia    Hypothyroidism    IBS (irritable bowel syndrome)    Liver cyst    Malignant melanoma in situ (HCC) 11/20/2022   Right labia minora. Excision 12/25/2022 at anterior vulva, left of clitoral hood, margins involved. Surgery scheduled for 02/12/2023.   Mallet finger of left hand    small finger   NSTEMI (non-ST elevated myocardial infarction) (HCC) 10/22/2023   Pre-diabetes     Past Surgical History:  Procedure Laterality Date   ABLATION     CARDIAC CATHETERIZATION  04/10/1999   per pt normal   CARDIOVASCULAR STRESS TEST  02-23-2011  dr Iantha Fallen f[facility])   normal nuclear study/  no ischemia/  ef 64%   CATARACT EXTRACTION W/ INTRAOCULAR LENS  IMPLANT, BILATERAL  12/10/2001   Bil   CESAREAN SECTION  12/10/1982   CLOSED REDUCTION FINGER WITH PERCUTANEOUS PINNING Left 04/22/2014   Procedure: LEFT SMALL FINGER CLOSED REDUCTION PINNING;  Surgeon: Sharma Covert, MD;  Location: [facility];  Service: Orthopedics;  Laterality: Left;   COLONOSCOPY  11/09/2002   DILATION AND CURETTAGE OF UTERUS  1977   LAPAROSCOPIC  CHOLECYSTECTOMY  03/18/2000   RECONSTRUCTION OF EYELID Bilateral 08/2022   RIGHT/LEFT HEART CATH AND CORONARY ANGIOGRAPHY Bilateral 11/25/2023   Procedure: RIGHT/LEFT HEART CATH AND CORONARY ANGIOGRAPHY;  Surgeon: Alwyn Pea, MD;  Location: [facility] INVASIVE CV LAB;  Service: Cardiovascular;  Laterality: Bilateral;   TRANSTHORACIC ECHOCARDIOGRAM  02/23/2011   normal lvf/  ef 58%/  mild rve/   mild lae/  mild  mr  & tr/  mild pulmonary htn   VULVECTOMY N/A 12/25/2022   Procedure: PARTIAL  VULVECTOMY;  Surgeon: [name], MD;  Location: [facility];  Service: Gynecology;  Laterality: N/A;   VULVECTOMY N/A 02/12/2023   Procedure: WIDE LOCAL EXCISION OF VULVA AND REMOVAL OF CLITORIS;  Surgeon: [name], MD;  Location: [facility];  Service: Gynecology;  Laterality: N/A;   WISDOM TOOTH EXTRACTION       Medications Prior to Admission: Prior to Admission medications   Medication Sig Start Date End Date Taking? Authorizing Provider  acetaminophen (TYLENOL) 650 MG CR tablet Take 1,300 mg by mouth every 8 (eight) hours as needed for pain.   Yes [provider]  apixaban (ELIQUIS) 5 MG TABS tablet Take 5 mg by mouth 2 (two) times daily.   Yes [provider]  ARIPiprazole (ABILIFY) 10 MG tablet Take 10  mg by mouth at bedtime. 07/11/23  Yes [provider]  aspirin EC 81 MG tablet Take 1 tablet (81 mg total) by mouth daily. Swallow whole. 10/25/23  Yes Sunnie Nielsen, DO  Biotin 5000 MCG CAPS Take 5,000 mcg by mouth at bedtime.   Yes [provider]  buPROPion (WELLBUTRIN XL) 300 MG 24 hr tablet Take 300 mg by mouth daily. 04/08/23  Yes [provider]  cetirizine (ZYRTEC ALLERGY) 10 MG tablet Take 10 mg by mouth daily.   Yes [provider]  cholecalciferol (VITAMIN D3) 25 MCG (1000 UNIT) tablet Take 1,000 Units by mouth daily.   Yes [provider]  clonazePAM (KLONOPIN) 1 MG tablet Take 1  mg by mouth at bedtime.   Yes [provider]  Coenzyme Q10 (COQ10) 100 MG CAPS Take 100 mg by mouth at bedtime.   Yes [provider]  famotidine (PEPCID) 20 MG tablet TAKE 1 TABLET TWICE DAILY 12/05/23  Yes Tower, Audrie Gallus, MD  fluticasone (FLONASE) 50 MCG/ACT nasal spray Place 2 sprays into both nostrils daily.   Yes [provider]  guaifenesin (ROBITUSSIN) 100 MG/5ML syrup Take 100 mg by mouth 2 (two) times daily as needed for cough.   Yes [provider]  lamoTRIgine (LAMICTAL) 200 MG tablet Take 200 mg by mouth at bedtime. 11/06/23  Yes [provider]  levothyroxine (SYNTHROID) 50 MCG tablet TAKE 1 TABLET EVERY DAY BEFORE BREAKFAST 06/20/23  Yes Tower, Marne A, MD  lisinopril (ZESTRIL) 2.5 MG tablet Take 1 tablet (2.5 mg total) by mouth daily. 10/25/23  Yes Sunnie Nielsen, DO  lovastatin (MEVACOR) 40 MG tablet TAKE 1 TABLET EVERY DAY Patient taking differently: Take 40 mg by mouth at bedtime. 06/20/23  Yes Tower, Audrie Gallus, MD  nystatin (MYCOSTATIN/NYSTOP) powder Apply 1 Application topically 3 (three) times daily. Under abdomen skin fold Patient taking differently: Apply 1 Application topically 3 (three) times daily as needed (irritation). Under abdomen skin fold 12/17/22  Yes Cross, Melissa D, NP  torsemide (DEMADEX) 20 MG tablet Take 20 mg by mouth daily. 11/15/23  Yes [provider]  zolpidem (AMBIEN) 10 MG tablet Take 10 mg by mouth at bedtime.   Yes [provider]     Allergies:    Allergies  Allergen Reactions   Ingrezza [Valbenazine Tosylate] Other (See Comments)    Night sweats, worsened tardive dyskinesia, nightmares   Lidocaine Swelling    ALL CAINE DRUGS EXCEPT: marcaine & sensercaine    Xylocaine [Lidocaine Hcl] Swelling    ALLERGIC TO ALL CAINES EXCEPT SENSORCAINE , AND MARCAINE   Albuterol Other (See Comments)    Leg swelling   Amantadine     Muscle pain and tremors    Austedo Xr [Deutetrabenazine]      Caused Muscle aches and tremors   Codeine Nausea And Vomiting    Vomits blood   Lithium Swelling   Singulair [Montelukast] Swelling    Leg swelling   Tegretol [Carbamazepine] Other (See Comments)    skin crawls    Social History:   Social History   Socioeconomic History   Marital status: Married    Spouse name: Not on file   Number of children: 1   Years of education: Not on file   Highest education level: Bachelor's degree (e.g., BA, AB, BS)  Occupational History   Occupation: Home    Employer: RETIRED  Tobacco Use   Smoking status: Never   Smokeless tobacco: Never  Vaping Use  Vaping status: Never Used  Substance and Sexual Activity   Alcohol use: Yes    Comment: Only on cruises or rarely   Drug use: No   Sexual activity: Not Currently    Birth control/protection: Post-menopausal  Other Topics Concern   Not on file  Social History Narrative   Moved here from [place]   Social Drivers of Health   Financial Resource Strain: Low Risk  (12/16/2023)   Overall Financial Resource Strain (CARDIA)    Difficulty of Paying Living Expenses: Not hard at all  Food Insecurity: No Food Insecurity (12/16/2023)   Hunger Vital Sign    Worried About Running Out of Food in the Last Year: Never true    Ran Out of Food in the Last Year: Never true  Transportation Needs: No Transportation Needs (12/16/2023)   PRAPARE - Administrator, Civil Service (Medical): No    Lack of Transportation (Non-Medical): No  Physical Activity: Sufficiently Active (12/16/2023)   Exercise Vital Sign    Days of Exercise per Week: 3 days    Minutes of Exercise per Session: 60 min  Stress: No Stress Concern Present (12/16/2023)   [employer] of Occupational Health - Occupational Stress Questionnaire    Feeling of Stress : Only a little  Recent Concern: Stress - Stress Concern Present (11/08/2023)   [employer] of Occupational Health - Occupational Stress Questionnaire    Feeling of Stress : To  some extent  Social Connections: Socially Integrated (12/16/2023)   Social Connection and Isolation Panel [NHANES]    Frequency of Communication with Friends and Family: Three times a week    Frequency of Social Gatherings with Friends and Family: Once a week    Attends Religious Services: More than 4 times per year    Active Member of [employer] or Organizations: No    Attends Engineer, structural: More than 4 times per year    Marital Status: Married  Catering manager Violence: Not At Risk (10/25/2023)   Humiliation, Afraid, Rape, and Kick questionnaire    Fear of Current or Ex-Partner: No    Emotionally Abused: No    Physically Abused: No    Sexually Abused: No     Family History:   The patient's family history includes Breast cancer in her sister and sister; Diabetes in her brother and father; Heart disease in her mother; Hyperlipidemia in her father; Hypertension in her father; Stroke in her mother. There is no history of Colon cancer, Ovarian cancer, Endometrial cancer, Pancreatic cancer, or Prostate cancer.    ROS:  Please see the history of present illness.  All other ROS reviewed and negative.     Physical Exam/Data:  There were no vitals filed for this visit. No intake or output data in the 24 hours ending 01/10/24 1116    12/23/2023    3:25 PM 12/16/2023    3:18 PM 11/25/2023   10:40 AM  Last 3 Weights  Weight (lbs) 215 lb 12.8 oz 227 lb 2 oz 214 lb 6.4 oz  Weight (kg) 97.886 kg 103.023 kg 97.251 kg     There is no height or weight on file to calculate BMI.  Wt Readings from Last 3 Encounters:  12/23/23 97.9 kg  12/16/23 103 kg  11/25/23 97.3 kg    Physical Exam: General: no acute distress. Head: Normocephalic, atraumatic  Neck: supple Lungs: nl effort Heart: nl rate, irreg irreg Abdomen: Soft Msk:  Strength and tone appear normal for age. Extremities:  warm, dry Neuro: awake and alert Psych:  Responds to questions appropriately with a normal affect.     Laboratory Data:  ChemistryNo results for input(s): "NA", "K", "CL", "CO2", "GLUCOSE", "BUN", "CREATININE", "CALCIUM", "GFRNONAA", "GFRAA", "ANIONGAP" in the last 168 hours.  No results for input(s): "PROT", "ALBUMIN", "AST", "ALT", "ALKPHOS", "BILITOT" in the last 168 hours. HematologyNo results for input(s): "WBC", "RBC", "HGB", "HCT", "MCV", "MCH", "MCHC", "RDW", "PLT" in the last 168 hours.  Assessment and Plan   Will proceed w/ TEE DCCV  ASA III  Anesthesia to be administered by CRNA  Signed, Tiajuana Amass, MD 01/10/2024, 11:16 AM

## 2024-01-10 NOTE — Anesthesia Preprocedure Evaluation (Addendum)
Anesthesia Evaluation  Patient identified by MRN, date of birth, ID band Patient awake    Reviewed: Allergy & Precautions, NPO status , Patient's Chart, lab work & pertinent test results  Airway Mallampati: III  TM Distance: >3 FB Neck ROM: full    Dental  (+) Teeth Intact   Pulmonary neg pulmonary ROS, shortness of breath and with exertion   Pulmonary exam normal  + decreased breath sounds      Cardiovascular Exercise Tolerance: Good hypertension, + Past MI  negative cardio ROS Normal cardiovascular exam+ dysrhythmias Atrial Fibrillation  Rhythm:Irregular Rate:Abnormal     Neuro/Psych  Headaches  Anxiety  Bipolar Disorder   negative neurological ROS  negative psych ROS   GI/Hepatic negative GI ROS, Neg liver ROS,GERD  Medicated,,  Endo/Other  negative endocrine ROSHypothyroidism  Class 3 obesity  Renal/GU negative Renal ROS  negative genitourinary   Musculoskeletal   Abdominal  (+) + obese  Peds negative pediatric ROS (+)  Hematology negative hematology ROS (+)   Anesthesia Other Findings Past Medical History: 03/29/2021: Actinic keratosis     Comment:  R central upper lip No date: Allergic rhinitis No date: Allergy 10/29/2023: Anemia No date: Anxiety No date: Arthritis 12/26/2021: Atypical mole     Comment:  spinal upper back, exc 01/15/22 10/16/2010: Basal cell carcinoma     Comment:  R lower pretibia  No date: Bipolar 1 disorder (HCC) No date: Depression No date: Fissure, anal No date: GERD (gastroesophageal reflux disease) No date: History of basal cell carcinoma excision     Comment:  ON FACE No date: History of squamous cell carcinoma excision     Comment:  ON RIGHT LEG No date: Hyperlipidemia No date: Hypothyroidism No date: IBS (irritable bowel syndrome) No date: Liver cyst 11/20/2022: Malignant melanoma in situ (HCC)     Comment:  Right labia minora. Excision 12/25/2022 at anterior                vulva, left of clitoral hood, margins involved. Surgery               scheduled for 02/12/2023. No date: Mallet finger of left hand     Comment:  small finger 10/22/2023: NSTEMI (non-ST elevated myocardial infarction) (HCC) No date: Pre-diabetes  Past Surgical History: No date: ABLATION 04/10/1999: CARDIAC CATHETERIZATION     Comment:  per pt normal 02-23-2011  dr Iantha Fallen fath [facility] clinic): CARDIOVASCULAR STRESS  TEST     Comment:  normal nuclear study/  no ischemia/  ef 64% 12/10/2001: CATARACT EXTRACTION W/ INTRAOCULAR LENS  IMPLANT,  BILATERAL     Comment:  Bil 12/10/1982: CESAREAN SECTION 04/22/2014: CLOSED REDUCTION FINGER WITH PERCUTANEOUS PINNING; Left     Comment:  Procedure: LEFT SMALL FINGER CLOSED REDUCTION PINNING;                Surgeon: Sharma Covert, MD;  Location: [facility];  Service: Orthopedics;  Laterality: Left; 11/09/2002: COLONOSCOPY 1977: DILATION AND CURETTAGE OF UTERUS 03/18/2000: LAPAROSCOPIC CHOLECYSTECTOMY 08/2022: RECONSTRUCTION OF EYELID; Bilateral 11/25/2023: RIGHT/LEFT HEART CATH AND CORONARY ANGIOGRAPHY; Bilateral     Comment:  Procedure: RIGHT/LEFT HEART CATH AND CORONARY               ANGIOGRAPHY;  Surgeon: Alwyn Pea, MD;  Location:              [facility];  Service: Cardiovascular;  Laterality: Bilateral; 02/23/2011: TRANSTHORACIC ECHOCARDIOGRAM     Comment:  normal lvf/  ef 58%/  mild rve/   mild lae/  mild  mr  &              tr/  mild pulmonary htn 12/25/2022: VULVECTOMY; N/A     Comment:  Procedure: PARTIAL  VULVECTOMY;  Surgeon: [name], MD;  Location: [facility];                Service: Gynecology;  Laterality: N/A; 02/12/2023: VULVECTOMY; N/A     Comment:  Procedure: WIDE LOCAL EXCISION OF VULVA AND REMOVAL OF               CLITORIS;  Surgeon: [name], MD;  Location:               [facility];   Service: Gynecology;                Laterality: N/A; No date: WISDOM TOOTH EXTRACTION     Reproductive/Obstetrics negative OB ROS                             Anesthesia Physical Anesthesia Plan  ASA: 3  Anesthesia Plan: General   Post-op Pain Management:    Induction: Intravenous  PONV Risk Score and Plan: Propofol infusion and TIVA  Airway Management Planned: Natural Airway and Nasal Cannula  Additional Equipment:   Intra-op Plan:   Post-operative Plan:   Informed Consent: I have reviewed the patients History and Physical, chart, labs and discussed the procedure including the risks, benefits and alternatives for the proposed anesthesia with the patient or authorized representative who has indicated his/her understanding and acceptance.     Dental Advisory Given  Plan Discussed with: CRNA  Anesthesia Plan Comments:        Anesthesia Quick Evaluation

## 2024-01-10 NOTE — Transfer of Care (Signed)
Immediate Anesthesia Transfer of Care Note  Patient: [name] [name]  Procedure(s) Performed: TRANSESOPHAGEAL ECHOCARDIOGRAM (TEE) CARDIOVERSION  Patient Location: PACU  Anesthesia Type:MAC  Level of Consciousness: awake, alert , and oriented  Airway & Oxygen Therapy: Patient Spontanous Breathing and Patient connected to nasal cannula oxygen  Post-op Assessment: Report given to RN and Post -op Vital signs reviewed and stable  Post vital signs: stable  Last Vitals:  Vitals Value Taken Time  BP 119/71   Temp    Pulse 57   Resp 16   SpO2 96     Last Pain:  Vitals:   01/10/24 1120  PainSc: 0-No pain         Complications: No notable events documented.

## 2024-01-10 NOTE — Progress Notes (Signed)
*  PRELIMINARY RESULTS* Echocardiogram Echocardiogram Transesophageal has been performed.  [name] [name] 01/10/2024, 1:36 PM

## 2024-01-12 ENCOUNTER — Encounter: Payer: Self-pay | Admitting: Cardiovascular Disease

## 2024-01-13 ENCOUNTER — Inpatient Hospital Stay (HOSPITAL_BASED_OUTPATIENT_CLINIC_OR_DEPARTMENT_OTHER): Payer: Medicare HMO | Admitting: Gynecologic Oncology

## 2024-01-13 ENCOUNTER — Inpatient Hospital Stay: Payer: Medicare HMO | Attending: Gynecologic Oncology | Admitting: Psychiatry

## 2024-01-13 VITALS — BP 124/57 | HR 53 | Temp 98.5°F | Resp 16 | Wt 211.8 lb

## 2024-01-13 DIAGNOSIS — Z9889 Other specified postprocedural states: Secondary | ICD-10-CM | POA: Insufficient documentation

## 2024-01-13 DIAGNOSIS — D039 Melanoma in situ, unspecified: Secondary | ICD-10-CM | POA: Diagnosis not present

## 2024-01-13 DIAGNOSIS — Z9079 Acquired absence of other genital organ(s): Secondary | ICD-10-CM | POA: Diagnosis not present

## 2024-01-13 DIAGNOSIS — C519 Malignant neoplasm of vulva, unspecified: Secondary | ICD-10-CM

## 2024-01-13 DIAGNOSIS — Z86002 Personal history of in-situ neoplasm of other and unspecified genital organs: Secondary | ICD-10-CM | POA: Insufficient documentation

## 2024-01-13 NOTE — Progress Notes (Unsigned)
Gynecologic Oncology Return Clinic Visit  Date of Service: 01/13/2024 Referring Provider: Nicholaus Bloom, MD   Assessment & Plan: [name] [name] is a 68 y.o. woman with vulvar melanoma in situ who is s/p wide local excision of the vulva on 12/25/22, followed by re-excision and removal of clitoris on 02/12/23, who presents today for surveillance.  Melanoma in situ: - Focal positive deep margin (one focal spot on 1 single slide). All other margins negative for MIS but positive for atypical lentiginous melanocytic hyperplasia (possible precursor lesion). - Two very small areas on exam today near scar anteriorly with brown appearance. Biopsies obtained (see below). Will follow-up results. - Continue surveillance with pelvic exam q59mo with scouting biopsies/diagnostic biopsies as indicated. If skin changes, positive biopsies, could consider treatment with aldara. - Once 1 year NED, may be able to reduce follow-up to q26mo. ***   RTC 10mo  [name], MD Gynecologic Oncology   Medical Decision Making I personally spent  TOTAL *** minutes face-to-face and non-face-to-face in the care of this patient, which includes all pre, intra, and post visit time on the date of service.   ----------------------- Reason for Visit: Follow-up, treatment discussion  Treatment History: Pt presented to her Ob/Gyn for evaluation of vulvar itching. She had noted itching the week prior, but this resolved with application of clobetasol which she had for a diagnosis of lichen simplex chronicus. However, patient had also been previously noted by another provider to have a brown vulvar lesions of her right labia minora 12/2021 which was rechecked 03/2022 and noted to be "stable" with plan for continued yearly follow-up.   On 11/12/22, her Ob/Gyn noted a 1cm brown lesion was noted "at the top of her labia minora." Given the appearance of a melanotic lesion, she was recommended to return for a biopsy, which was completed on  11/20/22. Biopsy returned with melanoma in situ, edges involved. No invasive disease was seen.   She underwent a simple partial vulvectomy on 12/25/22 with path results: A. ANTERIOR VULVA, EXCISION: MELANOMA IN SITU, EXTENDING TO THE DEEP  SURGICAL MARGIN. THE PERIPHERAL MARGINS ARE NEGATIVE.  B. ANTERIOR VULVA, LEFT OF CLITORAL HOOD, EXCISION:  MELANOMA IN SITU  INVOLVING THE 12, 3, 6 AND 9 O'CLOCK MARGINS.   Given multiple positive margins, she returned to the OR on 02/12/23 for additional resection including a WLE of the anterior vulva including removal of remaining bilateral labia minora and removal of clitoris with pathology showing: A.   VULVA, ANTERIOR MARGIN, BIOPSY: NO MELANOMA IN SITU SEEN. COMMENT: Dr. Maryelizabeth Kaufmann is in agreement. B.   VULVA, LEFT ANTERIOR MARGIN, BIOPSY: NO MELANOMA IN SITU SEEN. COMMENT: Dr. Maryelizabeth Kaufmann is in agreement. C.   VULVA, LEFT MEDIAL MARGIN, BIOPSY: NO MELANOMA IN SITU SEEN. COMMENT: MelanA was used to evaluate this slide. Dr. Maryelizabeth Kaufmann is in agreement D.   **PRELIMINARY DIAGNOSIS** VULVA, ANTERIOR WITH LEFT/RIGHT LABIA MINORA, EXCISION: -RESIDUAL MELANOMA IN SITU , MULTFOCAL RESIDUAL FOCI , INVOLVING BLACK/DEEP MARGIN (D2), -BACKGROUND ATYPICAL JUNCTIONAL MELANOCYTIC HYPERPLASIA INVOLVING MULTIFOCAL MARGINS (ANTERIOR, OUTER LEFT, INNER LEFT MARGINS INVOLVED WITH ATYPICAL JUNCTIONAL MELANOCYTIC HYPERPLASIA, SEE COMMENT) COMMENT: Sections show extensive areas of atypical lentiginous melanocytic hyperplasia involving the margins noted above, which may be a precursor lesion to the residual melanoma in situ present. The atypical lentiginous melanocytic hyperplasia does not meet criteria for melanoma in situ. These atypical lentiginous foci are negative with PRAME, and exhibit lesser cytological atypia on MITF staining. MelanA overstains many foci and is not contributory. However, residual melanoma  in situ with focal upward (pagetoid) growth, severe atypia of  melanocytes, and melanocytic confluence is seen in multifocal areas, but notably ONLY at the margin deep inked black (block D2). The remainder of the margins are free of melanoma in situ.  E.   VULVA, LEFT LATERAL MARGIN, EXCISION: ATYPICAL JUNCTIONAL MELANOCYTIC HYPERPLASIA, NO MELANOMA IN SITU COMMENT: No melanoma in situ is seen. PRAME, MelanA, and MITF were used to evaluate this margin. Dr. Maryelizabeth Kaufmann has reviewed this case and is in agreement with the diagnoses rendered above. In managing this patient with extensive atypical lentiginous melanocytic hyperplasia, which likely represents a confluence of extensive melanotic macules and precursor disease to melanoma in situ, this reference article is provided: Charlcie Cradle. May P. [name], Shitanshu Uppai: Margin status in vulvovaginal melanoma: Management and oncologic outcomes of 50 cases. Gynecol Oncol Rep. 2023 Oct; 49: [id number]   Scouting biopsies on 06/03/23: negative Scouting biopsies 10/14/254: lichenoid dermatitis  ***  Interval History: *** Cardioversion on Friday, didn't work So follow-up 2/11 On toresemide, helps with sob Pulmonary htn    Past Medical/Surgical History: Past Medical History:  Diagnosis Date   Actinic keratosis 03/29/2021   R central upper lip   Allergic rhinitis    Allergy    Anemia 10/29/2023   Anxiety    Arthritis    Atypical mole 12/26/2021   spinal upper back, exc 01/15/22   Basal cell carcinoma 10/16/2010   R lower pretibia    Bipolar 1 disorder (HCC)    Depression    Fissure, anal    GERD (gastroesophageal reflux disease)    History of basal cell carcinoma excision    ON FACE   History of squamous cell carcinoma excision    ON RIGHT LEG   Hyperlipidemia    Hypothyroidism    IBS (irritable bowel syndrome)    Liver cyst    Malignant melanoma in situ (HCC) 11/20/2022   Right labia minora. Excision 12/25/2022 at anterior vulva, left of clitoral hood, margins involved. Surgery scheduled  for 02/12/2023.   Mallet finger of left hand    small finger   NSTEMI (non-ST elevated myocardial infarction) (HCC) 10/22/2023   Pre-diabetes     Past Surgical History:  Procedure Laterality Date   ABLATION     CARDIAC CATHETERIZATION  04/10/1999   per pt normal   CARDIOVASCULAR STRESS TEST  02-23-2011  dr Iantha Fallen fath [facility] clinic)   normal nuclear study/  no ischemia/  ef 64%   CARDIOVERSION N/A 01/10/2024   Procedure: CARDIOVERSION;  Surgeon: Tiajuana Amass, MD;  Location: [facility];  Service: Cardiovascular;  Laterality: N/A;   CATARACT EXTRACTION W/ INTRAOCULAR LENS  IMPLANT, BILATERAL  12/10/2001   Bil   CESAREAN SECTION  12/10/1982   CLOSED REDUCTION FINGER WITH PERCUTANEOUS PINNING Left 04/22/2014   Procedure: LEFT SMALL FINGER CLOSED REDUCTION PINNING;  Surgeon: Sharma Covert, MD;  Location: [facility] [place];  Service: Orthopedics;  Laterality: Left;   COLONOSCOPY  11/09/2002   DILATION AND CURETTAGE OF UTERUS  1977   LAPAROSCOPIC CHOLECYSTECTOMY  03/18/2000   RECONSTRUCTION OF EYELID Bilateral 08/2022   RIGHT/LEFT HEART CATH AND CORONARY ANGIOGRAPHY Bilateral 11/25/2023   Procedure: RIGHT/LEFT HEART CATH AND CORONARY ANGIOGRAPHY;  Surgeon: Alwyn Pea, MD;  Location: [facility];  Service: Cardiovascular;  Laterality: Bilateral;   TEE WITHOUT CARDIOVERSION N/A 01/10/2024   Procedure: TRANSESOPHAGEAL ECHOCARDIOGRAM (TEE);  Surgeon: Tiajuana Amass, MD;  Location: [facility];  Service: Cardiovascular;  Laterality: N/A;  TRANSTHORACIC ECHOCARDIOGRAM  02/23/2011   normal lvf/  ef 58%/  mild rve/   mild lae/  mild  mr  & tr/  mild pulmonary htn   VULVECTOMY N/A 12/25/2022   Procedure: PARTIAL  VULVECTOMY;  Surgeon: [name], MD;  Location: [facility] [place];  Service: Gynecology;  Laterality: N/A;   VULVECTOMY N/A 02/12/2023   Procedure: WIDE LOCAL EXCISION OF VULVA AND REMOVAL OF CLITORIS;  Surgeon: [name], MD;  Location:  [facility] [place];  Service: Gynecology;  Laterality: N/A;   WISDOM TOOTH EXTRACTION      Family History  Problem Relation Age of Onset   Stroke Mother    Heart disease Mother    Hypertension Father    Diabetes Father    Hyperlipidemia Father    Breast cancer Sister    Breast cancer Sister    Diabetes Brother    Colon cancer Neg Hx    Ovarian cancer Neg Hx    Endometrial cancer Neg Hx    Pancreatic cancer Neg Hx    Prostate cancer Neg Hx     Social History   Socioeconomic History   Marital status: Married    Spouse name: Not on file   Number of children: 1   Years of education: Not on file   Highest education level: Bachelor's degree (e.g., BA, AB, BS)  Occupational History   Occupation: Home    Employer: RETIRED  Tobacco Use   Smoking status: Never   Smokeless tobacco: Never  Vaping Use   Vaping status: Never Used  Substance and Sexual Activity   Alcohol use: Yes    Comment: Only on cruises or rarely   Drug use: No   Sexual activity: Not Currently    Birth control/protection: Post-menopausal  Other Topics Concern   Not on file  Social History Narrative   Moved here from [place]   Social Drivers of Health   Financial Resource Strain: Low Risk  (12/16/2023)   Overall Financial Resource Strain (CARDIA)    Difficulty of Paying Living Expenses: Not hard at all  Food Insecurity: No Food Insecurity (12/16/2023)   Hunger Vital Sign    Worried About Running Out of Food in the Last Year: Never true    Ran Out of Food in the Last Year: Never true  Transportation Needs: No Transportation Needs (12/16/2023)   PRAPARE - Administrator, Civil Service (Medical): No    Lack of Transportation (Non-Medical): No  Physical Activity: Sufficiently Active (12/16/2023)   Exercise Vital Sign    Days of Exercise per Week: 3 days    Minutes of Exercise per Session: 60 min  Stress: No Stress Concern Present (12/16/2023)   [employer] of Occupational Health -  Occupational Stress Questionnaire    Feeling of Stress : Only a little  Recent Concern: Stress - Stress Concern Present (11/08/2023)   [employer] of Occupational Health - Occupational Stress Questionnaire    Feeling of Stress : To some extent  Social Connections: Socially Integrated (12/16/2023)   Social Connection and Isolation Panel [NHANES]    Frequency of Communication with Friends and Family: Three times a week    Frequency of Social Gatherings with Friends and Family: Once a week    Attends Religious Services: More than 4 times per year    Active Member of [employer] or Organizations: No    Attends Engineer, structural: More than 4 times per year    Marital Status: Married  Current Medications:  Current Outpatient Medications:    acetaminophen (TYLENOL) 650 MG CR tablet, Take 1,300 mg by mouth every 8 (eight) hours as needed for pain., Disp: , Rfl:    apixaban (ELIQUIS) 5 MG TABS tablet, Take 5 mg by mouth 2 (two) times daily., Disp: , Rfl:    ARIPiprazole (ABILIFY) 10 MG tablet, Take 10 mg by mouth at bedtime., Disp: , Rfl:    aspirin EC 81 MG tablet, Take 1 tablet (81 mg total) by mouth daily. Swallow whole., Disp: 30 tablet, Rfl: 0   Biotin 5000 MCG CAPS, Take 5,000 mcg by mouth at bedtime., Disp: , Rfl:    buPROPion (WELLBUTRIN XL) 300 MG 24 hr tablet, Take 300 mg by mouth daily., Disp: , Rfl:    cetirizine (ZYRTEC ALLERGY) 10 MG tablet, Take 10 mg by mouth daily., Disp: , Rfl:    cholecalciferol (VITAMIN D3) 25 MCG (1000 UNIT) tablet, Take 1,000 Units by mouth daily., Disp: , Rfl:    clonazePAM (KLONOPIN) 1 MG tablet, Take 1 mg by mouth at bedtime., Disp: , Rfl:    Coenzyme Q10 (COQ10) 100 MG CAPS, Take 100 mg by mouth at bedtime., Disp: , Rfl:    famotidine (PEPCID) 20 MG tablet, TAKE 1 TABLET TWICE DAILY, Disp: 180 tablet, Rfl: 0   fluticasone (FLONASE) 50 MCG/ACT nasal spray, Place 2 sprays into both nostrils daily., Disp: , Rfl:    guaifenesin (ROBITUSSIN) 100  MG/5ML syrup, Take 100 mg by mouth 2 (two) times daily as needed for cough. (Patient not taking: Reported on 01/10/2024), Disp: , Rfl:    lamoTRIgine (LAMICTAL) 200 MG tablet, Take 200 mg by mouth at bedtime., Disp: , Rfl:    levothyroxine (SYNTHROID) 50 MCG tablet, TAKE 1 TABLET EVERY DAY BEFORE BREAKFAST, Disp: 90 tablet, Rfl: 2   lisinopril (ZESTRIL) 2.5 MG tablet, Take 1 tablet (2.5 mg total) by mouth daily., Disp: 30 tablet, Rfl: 0   lovastatin (MEVACOR) 40 MG tablet, TAKE 1 TABLET EVERY DAY (Patient taking differently: Take 40 mg by mouth at bedtime.), Disp: 90 tablet, Rfl: 2   nystatin (MYCOSTATIN/NYSTOP) powder, Apply 1 Application topically 3 (three) times daily. Under abdomen skin fold (Patient not taking: Reported on 01/10/2024), Disp: 15 g, Rfl: 2   torsemide (DEMADEX) 20 MG tablet, Take 20 mg by mouth daily., Disp: , Rfl:    zolpidem (AMBIEN) 10 MG tablet, Take 10 mg by mouth at bedtime., Disp: , Rfl:   Review of Symptoms: Complete 10-system review is positive for: none  Physical Exam: There were no vitals taken for this visit. General: Alert, oriented, no acute distress. HEENT: Normocephalic, atraumatic. Neck symmetric without masses. Sclera anicteric.  Chest: Normal work of breathing.  Abdomen: Soft, nontender.  Extremities: Grossly normal range of motion.  Warm, well perfused.  Edema bilateral ankles Skin: No rashes or lesions noted. Lymphatics: No supraclavicular, cervical or inguinal adenopathy. ***  Laboratory & Radiologic Studies: Surgical pathology (12/23/23): A. LEFT ANTERIOR VULVA BIOPSY: MELANOMA IN SITU, EARLY.  PERIPHERAL MARGIN INVOLVED.  MICROSCOPIC DESCRIPTION: There is a proliferation of atypical melanocytes arranged as solitary at the dermal epidermal junction. The dermis contains a reactive inflammatory cell infiltrate.  No invasive disease is seen.  These findings are those of an early evolving melanoma in situ. Immunohistochemistry stains for MITF and  Melan-A highlights the melanocytes. PRAME stain is negative.  Re-excision is recommended in order to ensure complete removal.  B. MIDLINE PROXIMAL ANTERIOR VULVA BIOPSY: ATYPICAL JUNCTIONAL MELANOCYTIC PROLIFERATION. MARGIN CLOSE.  MICROSCOPIC DESCRIPTION:  There is a proliferation of atypical melanocytes arranged predominantly as single cells at the dermoepidermal junction.  There is no confluent growth. Immunohistochemistry stains for MITF and Melan-A highlights the melanocytes. PRAME stain is negative. Re-excision is recommended in order to ensure complete removal.   COMMENT:  The slides were also reviewed by Dr. Dalia Heading, who is in general agreement with the diagnosis.

## 2024-01-13 NOTE — H&P (View-Only) (Signed)
 Gynecologic Oncology Return Clinic Visit  Date of Service: 01/13/2024 Referring Provider: Nicholaus Bloom, MD   Assessment & Plan: [name] [name] is a 68 y.o. woman with vulvar melanoma in situ who is s/p wide local excision of the vulva on 12/25/22, followed by re-excision and removal of clitoris on 02/12/23, who presents today for surveillance.  Melanoma in situ: - Focal positive deep margin (one focal spot on 1 single slide). All other margins negative for MIS but positive for atypical lentiginous melanocytic hyperplasia (possible precursor lesion). - Two very small areas on exam last visit near scar anteriorly with brown appearance. Biopsies obtained and c/w melanoma in situ an precursor lesion. - Recommend repeat vulvar excision given recurrent melanoma in situ.  Patient was consented for: simple partial vulvectomy on 02/11/24.  The risks of surgery were discussed in detail and she understands these to including but not limited to bleeding requiring a blood transfusion, infection, injury to adjacent organs (including but not limited to the urethra, vagina, nearby blood vessels and nerves), wound separation, unforseen complication, and possible need for re-exploration.  If the patient experiences any of these events, she understands that her hospitalization or recovery may be prolonged and that she may need to take additional medications for a prolonged period. The patient will receive DVT and antibiotic prophylaxis as indicated. She voiced a clear understanding. She had the opportunity to ask questions and informed consent was obtained today. She wishes to proceed.  Given her recent cardiac procedures, will obtain cardiac clearance.  All preoperative instructions were reviewed. Postoperative expectations were also reviewed. Written handouts were provided to the patient.   RTC postop   [name], MD Gynecologic Oncology   Medical Decision Making I personally spent  TOTAL 25 minutes  face-to-face and non-face-to-face in the care of this patient, which includes all pre, intra, and post visit time on the date of service.   ----------------------- Reason for Visit: Follow-up, treatment discussion  Treatment History: Pt presented to her Ob/Gyn for evaluation of vulvar itching. She had noted itching the week prior, but this resolved with application of clobetasol which she had for a diagnosis of lichen simplex chronicus. However, patient had also been previously noted by another provider to have a brown vulvar lesions of her right labia minora 12/2021 which was rechecked 03/2022 and noted to be "stable" with plan for continued yearly follow-up.   On 11/12/22, her Ob/Gyn noted a 1cm brown lesion was noted "at the top of her labia minora." Given the appearance of a melanotic lesion, she was recommended to return for a biopsy, which was completed on 11/20/22. Biopsy returned with melanoma in situ, edges involved. No invasive disease was seen.   She underwent a simple partial vulvectomy on 12/25/22 with path results: A. ANTERIOR VULVA, EXCISION: MELANOMA IN SITU, EXTENDING TO THE DEEP  SURGICAL MARGIN. THE PERIPHERAL MARGINS ARE NEGATIVE.  B. ANTERIOR VULVA, LEFT OF CLITORAL HOOD, EXCISION:  MELANOMA IN SITU  INVOLVING THE 12, 3, 6 AND 9 O'CLOCK MARGINS.   Given multiple positive margins, she returned to the OR on 02/12/23 for additional resection including a WLE of the anterior vulva including removal of remaining bilateral labia minora and removal of clitoris with pathology showing: A.   VULVA, ANTERIOR MARGIN, BIOPSY: NO MELANOMA IN SITU SEEN. COMMENT: Dr. Maryelizabeth Kaufmann is in agreement. B.   VULVA, LEFT ANTERIOR MARGIN, BIOPSY: NO MELANOMA IN SITU SEEN. COMMENT: Dr. Maryelizabeth Kaufmann is in agreement. C.   VULVA, LEFT MEDIAL MARGIN, BIOPSY: NO MELANOMA  IN SITU SEEN. COMMENT: MelanA was used to evaluate this slide. Dr. Maryelizabeth Kaufmann is in agreement D.   **PRELIMINARY DIAGNOSIS** VULVA, ANTERIOR WITH LEFT/RIGHT  LABIA MINORA, EXCISION: -RESIDUAL MELANOMA IN SITU , MULTFOCAL RESIDUAL FOCI , INVOLVING BLACK/DEEP MARGIN (D2), -BACKGROUND ATYPICAL JUNCTIONAL MELANOCYTIC HYPERPLASIA INVOLVING MULTIFOCAL MARGINS (ANTERIOR, OUTER LEFT, INNER LEFT MARGINS INVOLVED WITH ATYPICAL JUNCTIONAL MELANOCYTIC HYPERPLASIA, SEE COMMENT) COMMENT: Sections show extensive areas of atypical lentiginous melanocytic hyperplasia involving the margins noted above, which may be a precursor lesion to the residual melanoma in situ present. The atypical lentiginous melanocytic hyperplasia does not meet criteria for melanoma in situ. These atypical lentiginous foci are negative with PRAME, and exhibit lesser cytological atypia on MITF staining. MelanA overstains many foci and is not contributory. However, residual melanoma in situ with focal upward (pagetoid) growth, severe atypia of melanocytes, and melanocytic confluence is seen in multifocal areas, but notably ONLY at the margin deep inked black (block D2). The remainder of the margins are free of melanoma in situ.  E.   VULVA, LEFT LATERAL MARGIN, EXCISION: ATYPICAL JUNCTIONAL MELANOCYTIC HYPERPLASIA, NO MELANOMA IN SITU COMMENT: No melanoma in situ is seen. PRAME, MelanA, and MITF were used to evaluate this margin. Dr. Maryelizabeth Kaufmann has reviewed this case and is in agreement with the diagnoses rendered above. In managing this patient with extensive atypical lentiginous melanocytic hyperplasia, which likely represents a confluence of extensive melanotic macules and precursor disease to melanoma in situ, this reference article is provided: Charlcie Cradle. May P. [name], Shitanshu Uppai: Margin status in vulvovaginal melanoma: Management and oncologic outcomes of 50 cases. Gynecol Oncol Rep. 2023 Oct; 49: [id number]   Scouting biopsies on 06/03/23: negative Scouting biopsies 10/14/254: lichenoid dermatitis  Diagnostic biopsies 12/23/23: melanoma in situ (left anterior) and atypical  junctional melanocytic proliferation (right anterior)  Interval History: Pt presents with her husband. Reports that she has been dealing with her heart lately. Presented for a cardioversion on Friday for her a fib but reports it didn't work. Has follow-up on 2/11. Has been on toresemide which she says has helped some with her SOB. Reports that she was told she also has some pulmonary HTN.  Thinks she is healing well from vulvar biopsies.   Past Medical/Surgical History: Past Medical History:  Diagnosis Date   Actinic keratosis 03/29/2021   R central upper lip   Allergic rhinitis    Allergy    Anemia 10/29/2023   Anxiety    Arthritis    Atypical mole 12/26/2021   spinal upper back, exc 01/15/22   Basal cell carcinoma 10/16/2010   R lower pretibia    Bipolar 1 disorder (HCC)    Depression    Fissure, anal    GERD (gastroesophageal reflux disease)    History of basal cell carcinoma excision    ON FACE   History of squamous cell carcinoma excision    ON RIGHT LEG   Hyperlipidemia    Hypothyroidism    IBS (irritable bowel syndrome)    Liver cyst    Malignant melanoma in situ (HCC) 11/20/2022   Right labia minora. Excision 12/25/2022 at anterior vulva, left of clitoral hood, margins involved. Surgery scheduled for 02/12/2023.   Mallet finger of left hand    small finger   NSTEMI (non-ST elevated myocardial infarction) (HCC) 10/22/2023   Pre-diabetes     Past Surgical History:  Procedure Laterality Date   ABLATION     CARDIAC CATHETERIZATION  04/10/1999   per pt normal   CARDIOVASCULAR STRESS TEST  02-23-2011  dr Iantha Fallen f[facility] clinic)   normal nuclear study/  no ischemia/  ef 64%   CARDIOVERSION N/A 01/10/2024   Procedure: CARDIOVERSION;  Surgeon: Tiajuana Amass, MD;  Location: [facility] ORS;  Service: Cardiovascular;  Laterality: N/A;   CATARACT EXTRACTION W/ INTRAOCULAR LENS  IMPLANT, BILATERAL  12/10/2001   Bil   CESAREAN SECTION  12/10/1982   CLOSED REDUCTION  FINGER WITH PERCUTANEOUS PINNING Left 04/22/2014   Procedure: LEFT SMALL FINGER CLOSED REDUCTION PINNING;  Surgeon: Sharma Covert, MD;  Location: [facility];  Service: Orthopedics;  Laterality: Left;   COLONOSCOPY  11/09/2002   DILATION AND CURETTAGE OF UTERUS  1977   LAPAROSCOPIC CHOLECYSTECTOMY  03/18/2000   RECONSTRUCTION OF EYELID Bilateral 08/2022   RIGHT/LEFT HEART CATH AND CORONARY ANGIOGRAPHY Bilateral 11/25/2023   Procedure: RIGHT/LEFT HEART CATH AND CORONARY ANGIOGRAPHY;  Surgeon: Alwyn Pea, MD;  Location: [facility] INVASIVE CV [facility];  Service: Cardiovascular;  Laterality: Bilateral;   TEE WITHOUT CARDIOVERSION N/A 01/10/2024   Procedure: TRANSESOPHAGEAL ECHOCARDIOGRAM (TEE);  Surgeon: Tiajuana Amass, MD;  Location: [facility] OR[facility];  Service: Cardiovascular;  Laterality: N/A;   TRANSTHORACIC ECHOCARDIOGRAM  02/23/2011   normal lvf/  ef 58%/  mild rve/   mild lae/  mild  mr  & tr/  mild pulmonary htn   VULVECTOMY N/A 12/25/2022   Procedure: PARTIAL  VULVECTOMY;  Surgeon: [name], MD;  Location: [facility] [place];  Service: Gynecology;  Laterality: N/A;   VULVECTOMY N/A 02/12/2023   Procedure: WIDE LOCAL EXCISION OF VULVA AND REMOVAL OF CLITORIS;  Surgeon: [name], MD;  Location: [facility] [place];  Service: Gynecology;  Laterality: N/A;   WISDOM TOOTH EXTRACTION      Family History  Problem Relation Age of Onset   Stroke Mother    Heart disease Mother    Hypertension Father    Diabetes Father    Hyperlipidemia Father    Breast cancer Sister    Breast cancer Sister    Diabetes Brother    Colon cancer Neg Hx    Ovarian cancer Neg Hx    Endometrial cancer Neg Hx    Pancreatic cancer Neg Hx    Prostate cancer Neg Hx     Social History   Socioeconomic History   Marital status: Married    Spouse name: Not on file   Number of children: 1   Years of education: Not on file   Highest education level: Bachelor's degree (e.g.,  BA, AB, BS)  Occupational History   Occupation: Home    Employer: RETIRED  Tobacco Use   Smoking status: Never   Smokeless tobacco: Never  Vaping Use   Vaping status: Never Used  Substance and Sexual Activity   Alcohol use: Yes    Comment: Only on cruises or rarely   Drug use: No   Sexual activity: Not Currently    Birth control/protection: Post-menopausal  Other Topics Concern   Not on file  Social History Narrative   Moved here from [place]   Social Drivers of Health   Financial Resource Strain: Low Risk  (12/16/2023)   Overall Financial Resource Strain (CARDIA)    Difficulty of Paying Living Expenses: Not hard at all  Food Insecurity: No Food Insecurity (12/16/2023)   Hunger Vital Sign    Worried About Running Out of Food in the Last Year: Never true    Ran Out of Food in the Last Year: Never true  Transportation Needs: No Transportation Needs (12/16/2023)  PRAPARE - Administrator, Civil Service (Medical): No    Lack of Transportation (Non-Medical): No  Physical Activity: Sufficiently Active (12/16/2023)   Exercise Vital Sign    Days of Exercise per Week: 3 days    Minutes of Exercise per Session: 60 min  Stress: No Stress Concern Present (12/16/2023)   [employer] of Occupational Health - Occupational Stress Questionnaire    Feeling of Stress : Only a little  Recent Concern: Stress - Stress Concern Present (11/08/2023)   [employer] of Occupational Health - Occupational Stress Questionnaire    Feeling of Stress : To some extent  Social Connections: Socially Integrated (12/16/2023)   Social Connection and Isolation Panel [NHANES]    Frequency of Communication with Friends and Family: Three times a week    Frequency of Social Gatherings with Friends and Family: Once a week    Attends Religious Services: More than 4 times per year    Active Member of [employer] or Organizations: No    Attends Engineer, structural: More than 4 times per year    Marital  Status: Married    Current Medications:  Current Outpatient Medications:    acetaminophen (TYLENOL) 650 MG CR tablet, Take 1,300 mg by mouth every 8 (eight) hours as needed for pain., Disp: , Rfl:    apixaban (ELIQUIS) 5 MG TABS tablet, Take 5 mg by mouth 2 (two) times daily., Disp: , Rfl:    ARIPiprazole (ABILIFY) 10 MG tablet, Take 10 mg by mouth at bedtime., Disp: , Rfl:    aspirin EC 81 MG tablet, Take 1 tablet (81 mg total) by mouth daily. Swallow whole., Disp: 30 tablet, Rfl: 0   Biotin 5000 MCG CAPS, Take 5,000 mcg by mouth at bedtime., Disp: , Rfl:    buPROPion (WELLBUTRIN XL) 300 MG 24 hr tablet, Take 300 mg by mouth daily., Disp: , Rfl:    cetirizine (ZYRTEC ALLERGY) 10 MG tablet, Take 10 mg by mouth daily., Disp: , Rfl:    cholecalciferol (VITAMIN D3) 25 MCG (1000 UNIT) tablet, Take 1,000 Units by mouth daily., Disp: , Rfl:    clonazePAM (KLONOPIN) 1 MG tablet, Take 1 mg by mouth at bedtime., Disp: , Rfl:    Coenzyme Q10 (COQ10) 100 MG CAPS, Take 100 mg by mouth at bedtime., Disp: , Rfl:    famotidine (PEPCID) 20 MG tablet, TAKE 1 TABLET TWICE DAILY, Disp: 180 tablet, Rfl: 0   fluticasone (FLONASE) 50 MCG/ACT nasal spray, Place 2 sprays into both nostrils daily., Disp: , Rfl:    guaifenesin (ROBITUSSIN) 100 MG/5ML syrup, Take 100 mg by mouth 2 (two) times daily as needed for cough. (Patient not taking: Reported on 01/10/2024), Disp: , Rfl:    lamoTRIgine (LAMICTAL) 200 MG tablet, Take 200 mg by mouth at bedtime., Disp: , Rfl:    levothyroxine (SYNTHROID) 50 MCG tablet, TAKE 1 TABLET EVERY DAY BEFORE BREAKFAST, Disp: 90 tablet, Rfl: 2   lisinopril (ZESTRIL) 2.5 MG tablet, Take 1 tablet (2.5 mg total) by mouth daily., Disp: 30 tablet, Rfl: 0   lovastatin (MEVACOR) 40 MG tablet, TAKE 1 TABLET EVERY DAY (Patient taking differently: Take 40 mg by mouth at bedtime.), Disp: 90 tablet, Rfl: 2   nystatin (MYCOSTATIN/NYSTOP) powder, Apply 1 Application topically 3 (three) times daily. Under  abdomen skin fold (Patient not taking: Reported on 01/10/2024), Disp: 15 g, Rfl: 2   senna-docusate (SENOKOT-S) 8.6-50 MG tablet, Take 2 tablets by mouth at bedtime. For AFTER surgery, do  not take if having diarrhea, Disp: 30 tablet, Rfl: 1   torsemide (DEMADEX) 20 MG tablet, Take 20 mg by mouth daily., Disp: , Rfl:    traMADol (ULTRAM) 50 MG tablet, Take 1 tablet (50 mg total) by mouth every 6 (six) hours as needed for severe pain (pain score 7-10). For AFTER surgery only, do not take and drive, Disp: 10 tablet, Rfl: 0   zolpidem (AMBIEN) 10 MG tablet, Take 10 mg by mouth at bedtime., Disp: , Rfl:   Review of Symptoms: Complete 10-system review is positive for: none  Physical Exam: BP (!) 124/57 (BP Location: Left Arm, Patient Position: Sitting)   Pulse (!) 53   Temp 98.5 F (36.9 C) (Oral)   Resp 16   Wt 211 lb 12.8 oz (96.1 kg)   SpO2 94%   BMI 40.02 kg/m  General: Alert, oriented, no acute distress. HEENT: Normocephalic, atraumatic. Neck symmetric without masses. Sclera anicteric.  Chest: Normal work of breathing.CTAB CV: Irregularly irregular rate and rhythm  Abdomen: Soft, nontender.  Extremities: Grossly normal range of motion.  Warm, well perfused.  Edema bilateral ankles Skin: No rashes or lesions noted.   Laboratory & Radiologic Studies: Surgical pathology (12/23/23): A. LEFT ANTERIOR VULVA BIOPSY: MELANOMA IN SITU, EARLY.  PERIPHERAL MARGIN INVOLVED.  MICROSCOPIC DESCRIPTION: There is a proliferation of atypical melanocytes arranged as solitary at the dermal epidermal junction. The dermis contains a reactive inflammatory cell infiltrate.  No invasive disease is seen.  These findings are those of an early evolving melanoma in situ. Immunohistochemistry stains for MITF and Melan-A highlights the melanocytes. PRAME stain is negative.  Re-excision is recommended in order to ensure complete removal.  B. MIDLINE PROXIMAL ANTERIOR VULVA BIOPSY: ATYPICAL  JUNCTIONAL MELANOCYTIC PROLIFERATION. MARGIN CLOSE.  MICROSCOPIC DESCRIPTION:  There is a proliferation of atypical melanocytes arranged predominantly as single cells at the dermoepidermal junction.  There is no confluent growth. Immunohistochemistry stains for MITF and Melan-A highlights the melanocytes. PRAME stain is negative. Re-excision is recommended in order to ensure complete removal.   COMMENT:  The slides were also reviewed by Dr. Dalia Heading, who is in general agreement with the diagnosis.

## 2024-01-13 NOTE — Patient Instructions (Addendum)
Preparing for your Surgery   Plan for surgery on  with Dr. [name] at [facility]. You will be scheduled for pelvic examination under anesthesia, wide local excision of the vulva, and any other indicated procedures.   Pre-operative Testing -You will receive a phone call from presurgical testing at [facility] to discuss surgery instructions and arrange for lab work if needed.   -Bring your insurance card, copy of an advanced directive if applicable, medication list.   -Your last dose of aspirin 81 mg should be the day before surgery. Do not take this the morning of surgery. DO NOT TAKE ELIQUIS FOR 2 DAYS BEFORE YOUR PROCEDURE.   -Do not take supplements such as fish oil (omega 3), red yeast rice, turmeric before your surgery. You want to avoid medications with aspirin in them including headache powders such as BC or Goody's), Excedrin migraine.   Day Before Surgery at Home -You will be advised you can have clear liquids up until 3 hours before your surgery.     Your role in recovery Your role is to become active as soon as directed by your doctor, while still giving yourself time to heal.  Rest when you feel tired. You will be asked to do the following in order to speed your recovery:   - Cough and breathe deeply. This helps to clear and expand your lungs and can prevent pneumonia after surgery.  - STAY ACTIVE WHEN YOU GET HOME. Do mild physical activity. Walking or moving your legs help your circulation and body functions return to normal. Do not try to get up or walk alone the first time after surgery.   -If you develop swelling on one leg or the other, pain in the back of your leg, redness/warmth in one of your legs, please call the office or go to the Emergency Room to have a doppler to rule out a blood clot. For shortness of breath, chest pain-seek care in the Emergency Room as soon as possible. - Actively manage your pain. Managing your pain lets you move in  comfort. We will ask you to rate your pain on a scale of zero to 10. It is your responsibility to tell your doctor or nurse where and how much you hurt so your pain can be treated.   Special Considerations -Your final pathology results from surgery should be available around one week after surgery and the results will be relayed to you when available.   -FMLA forms can be faxed to [phone] and please allow 5-7 business days for completion.   Pain Management After Surgery -You will be prescribed your pain medication and bowel regimen medications before surgery so that you can have these available when you are discharged from the hospital. The pain medication is for use ONLY AFTER surgery and a new prescription will not be given.    -Make sure that you have Tylenol at home IF YOU ARE ABLE TO TAKE THESE MEDICATION to use on a regular basis after surgery for pain control.    -Review the attached handout on narcotic use and their risks and side effects.    Bowel Regimen -You will be prescribed Sennakot-S to take nightly to prevent constipation especially if you are taking the narcotic pain medication intermittently.  It is important to prevent constipation and drink adequate amounts of liquids. You can stop taking this medication when you are not taking pain medication and you are back on your normal bowel routine.  Risks of Surgery Risks of surgery are low but include bleeding, infection, damage to surrounding structures, re-operation, blood clots, and very rarely death.   AFTER SURGERY INSTRUCTIONS   Return to work:  variable based on occupation   We recommend purchasing several bags of frozen green peas and dividing them into ziploc bags. You will want to keep these in the freezer and have them ready to use as ice packs to the vulvar incision. Once the ice pack is no longer cold, you can get another from the freezer. The frozen peas mold to your body better than a regular ice pack.       Activity: 1. Be up and out of the bed during the day.  Take a nap if needed.  You may walk up steps but be careful and use the hand rail.  Stair climbing will tire you more than you think, you may need to stop part way and rest.    2. No lifting or straining for 4 weeks over 10 pounds. No pushing, pulling, straining for 4 weeks.   3. No driving for minimum 24 hours after surgery but this is usually longer since you need to be able to brake safely and be off pain meds.  Do not drive if you are taking narcotic pain medicine and make sure that your reaction time has returned.    4. You can shower as soon as the next day after surgery. Shower daily. No tub baths or submerging your body in water until cleared by your surgeon. If you have the soap that was given to you by pre-surgical testing that was used before surgery, you do not need to use it afterwards because this can irritate your incisions.    5. No sexual activity and nothing in the vagina for 4 weeks.   6. You may experience vaginal spotting and discharge after surgery.  The spotting is normal but if you experience heavy bleeding, call our office.   7. Take Tylenol first for pain if you are able to take these medication and only use narcotic pain medication for severe pain not relieved by the Tylenol.  Monitor your Tylenol intake to a max of 4,000 mg in a 24 hour period.    Diet: 1. Low sodium Heart Healthy Diet is recommended but you are cleared to resume your normal (before surgery) diet after your procedure.   2. It is safe to use a laxative, such as Miralax or Colace, if you have difficulty moving your bowels. You have been prescribed Sennakot at bedtime every evening to keep bowel movements regular and to prevent constipation.     Wound Care: 1. Keep clean and dry.  Shower daily.   Reasons to call the Doctor: Fever - Oral temperature greater than 100.4 degrees Fahrenheit Foul-smelling vaginal discharge Difficulty  urinating Nausea and vomiting Increased pain at the site of the incision that is unrelieved with pain medicine. Difficulty breathing with or without chest pain New calf pain especially if only on one side Sudden, continuing increased vaginal bleeding with or without clots.   Contacts: For questions or concerns you should contact:   Dr. [name] at [phone]   [name], NP at [phone]   After Hours: call [phone] and have the GYN Oncologist paged/contacted (after 5 pm or on the weekends).   Messages sent via mychart are for non-urgent matters and are not responded to after hours so for urgent needs, please call the after hours number.

## 2024-01-14 ENCOUNTER — Encounter: Payer: Self-pay | Admitting: Gynecologic Oncology

## 2024-01-14 MED ORDER — TRAMADOL HCL 50 MG PO TABS: 50.0000 mg | ORAL_TABLET | Freq: Four times a day (QID) | ORAL | 0 refills | Status: DC | PRN

## 2024-01-14 MED ORDER — SENNOSIDES-DOCUSATE SODIUM 8.6-50 MG PO TABS: 2.0000 | ORAL_TABLET | Freq: Every day | ORAL | 1 refills | Status: DC

## 2024-01-14 NOTE — Progress Notes (Signed)
Patient here for follow up for a pre-operative appointment prior to her scheduled surgery on February 11, 2024. She is scheduled for a WLE vulva. The surgery was discussed in detail.  See after visit summary for additional details.    Discussed post-op pain management in detail including the aspects of the enhanced recovery pathway.  Advised her that a new prescription would be sent in for tramadol and it is only to be used for after her upcoming surgery.  We discussed the use of tylenol post-op and to monitor for a maximum of 4,000 mg in a 24 hour period.  Also prescribed sennakot to be used after surgery and to hold if having loose stools.  Discussed bowel regimen in detail.     Discussed measures to take at home to prevent DVT including frequent mobility.  Reportable signs and symptoms of DVT discussed. Post-operative instructions discussed and expectations for after surgery. Incisional care discussed as well including reportable signs and symptoms including erythema, drainage, wound separation.     10 minutes spent preparing information and with the patient.  Verbalizing understanding of material discussed. No needs or concerns voiced at the end of the visit.   Advised patient to call for any needs.  Advised that her post-operative medications had been prescribed and could be picked up at any time.    This appointment is included in the global surgical bundle as pre-operative teaching and has no charge.

## 2024-01-14 NOTE — Patient Instructions (Signed)
Preparing for your Surgery   Plan for surgery on  with Dr. [name] at [facility]. You will be scheduled for pelvic examination under anesthesia, wide local excision of the vulva, and any other indicated procedures.   Pre-operative Testing -You will receive a phone call from presurgical testing at [facility] to discuss surgery instructions and arrange for lab work if needed.   -Bring your insurance card, copy of an advanced directive if applicable, medication list.   -Your last dose of aspirin 81 mg should be the day before surgery. Do not take this the morning of surgery. DO NOT TAKE ELIQUIS FOR 2 DAYS BEFORE YOUR PROCEDURE.   -Do not take supplements such as fish oil (omega 3), red yeast rice, turmeric before your surgery. You want to avoid medications with aspirin in them including headache powders such as BC or Goody's), Excedrin migraine.   Day Before Surgery at Home -You will be advised you can have clear liquids up until 3 hours before your surgery.     Your role in recovery Your role is to become active as soon as directed by your doctor, while still giving yourself time to heal.  Rest when you feel tired. You will be asked to do the following in order to speed your recovery:   - Cough and breathe deeply. This helps to clear and expand your lungs and can prevent pneumonia after surgery.  - STAY ACTIVE WHEN YOU GET HOME. Do mild physical activity. Walking or moving your legs help your circulation and body functions return to normal. Do not try to get up or walk alone the first time after surgery.   -If you develop swelling on one leg or the other, pain in the back of your leg, redness/warmth in one of your legs, please call the office or go to the Emergency Room to have a doppler to rule out a blood clot. For shortness of breath, chest pain-seek care in the Emergency Room as soon as possible. - Actively manage your pain. Managing your pain lets you move in  comfort. We will ask you to rate your pain on a scale of zero to 10. It is your responsibility to tell your doctor or nurse where and how much you hurt so your pain can be treated.   Special Considerations -Your final pathology results from surgery should be available around one week after surgery and the results will be relayed to you when available.   -FMLA forms can be faxed to [phone] and please allow 5-7 business days for completion.   Pain Management After Surgery -You will be prescribed your pain medication and bowel regimen medications before surgery so that you can have these available when you are discharged from the hospital. The pain medication is for use ONLY AFTER surgery and a new prescription will not be given.    -Make sure that you have Tylenol at home IF YOU ARE ABLE TO TAKE THESE MEDICATION to use on a regular basis after surgery for pain control.    -Review the attached handout on narcotic use and their risks and side effects.    Bowel Regimen -You will be prescribed Sennakot-S to take nightly to prevent constipation especially if you are taking the narcotic pain medication intermittently.  It is important to prevent constipation and drink adequate amounts of liquids. You can stop taking this medication when you are not taking pain medication and you are back on your normal bowel routine.  Risks of Surgery Risks of surgery are low but include bleeding, infection, damage to surrounding structures, re-operation, blood clots, and very rarely death.   AFTER SURGERY INSTRUCTIONS   Return to work:  variable based on occupation   We recommend purchasing several bags of frozen green peas and dividing them into ziploc bags. You will want to keep these in the freezer and have them ready to use as ice packs to the vulvar incision. Once the ice pack is no longer cold, you can get another from the freezer. The frozen peas mold to your body better than a regular ice pack.       Activity: 1. Be up and out of the bed during the day.  Take a nap if needed.  You may walk up steps but be careful and use the hand rail.  Stair climbing will tire you more than you think, you may need to stop part way and rest.    2. No lifting or straining for 4 weeks over 10 pounds. No pushing, pulling, straining for 4 weeks.   3. No driving for minimum 24 hours after surgery but this is usually longer since you need to be able to brake safely and be off pain meds.  Do not drive if you are taking narcotic pain medicine and make sure that your reaction time has returned.    4. You can shower as soon as the next day after surgery. Shower daily. No tub baths or submerging your body in water until cleared by your surgeon. If you have the soap that was given to you by pre-surgical testing that was used before surgery, you do not need to use it afterwards because this can irritate your incisions.    5. No sexual activity and nothing in the vagina for 4 weeks.   6. You may experience vaginal spotting and discharge after surgery.  The spotting is normal but if you experience heavy bleeding, call our office.   7. Take Tylenol first for pain if you are able to take these medication and only use narcotic pain medication for severe pain not relieved by the Tylenol.  Monitor your Tylenol intake to a max of 4,000 mg in a 24 hour period.    Diet: 1. Low sodium Heart Healthy Diet is recommended but you are cleared to resume your normal (before surgery) diet after your procedure.   2. It is safe to use a laxative, such as Miralax or Colace, if you have difficulty moving your bowels. You have been prescribed Sennakot at bedtime every evening to keep bowel movements regular and to prevent constipation.     Wound Care: 1. Keep clean and dry.  Shower daily.   Reasons to call the Doctor: Fever - Oral temperature greater than 100.4 degrees Fahrenheit Foul-smelling vaginal discharge Difficulty  urinating Nausea and vomiting Increased pain at the site of the incision that is unrelieved with pain medicine. Difficulty breathing with or without chest pain New calf pain especially if only on one side Sudden, continuing increased vaginal bleeding with or without clots.   Contacts: For questions or concerns you should contact:   Dr. [name] at [phone]   [name], NP at [phone]   After Hours: call [phone] and have the GYN Oncologist paged/contacted (after 5 pm or on the weekends).   Messages sent via mychart are for non-urgent matters and are not responded to after hours so for urgent needs, please call the after hours number.

## 2024-01-19 ENCOUNTER — Encounter: Payer: Self-pay | Admitting: Psychiatry

## 2024-01-21 ENCOUNTER — Other Ambulatory Visit: Payer: Self-pay | Admitting: Family Medicine

## 2024-01-21 DIAGNOSIS — Z7901 Long term (current) use of anticoagulants: Secondary | ICD-10-CM | POA: Diagnosis not present

## 2024-01-21 DIAGNOSIS — I10 Essential (primary) hypertension: Secondary | ICD-10-CM | POA: Diagnosis not present

## 2024-01-21 DIAGNOSIS — Z9889 Other specified postprocedural states: Secondary | ICD-10-CM | POA: Diagnosis not present

## 2024-01-21 DIAGNOSIS — R0602 Shortness of breath: Secondary | ICD-10-CM | POA: Diagnosis not present

## 2024-01-21 DIAGNOSIS — I509 Heart failure, unspecified: Secondary | ICD-10-CM | POA: Diagnosis not present

## 2024-01-21 DIAGNOSIS — R001 Bradycardia, unspecified: Secondary | ICD-10-CM | POA: Diagnosis not present

## 2024-01-21 DIAGNOSIS — Z01810 Encounter for preprocedural cardiovascular examination: Secondary | ICD-10-CM | POA: Diagnosis not present

## 2024-01-21 DIAGNOSIS — I480 Paroxysmal atrial fibrillation: Secondary | ICD-10-CM | POA: Diagnosis not present

## 2024-01-21 DIAGNOSIS — I272 Pulmonary hypertension, unspecified: Secondary | ICD-10-CM | POA: Diagnosis not present

## 2024-01-24 ENCOUNTER — Ambulatory Visit: Payer: Medicare HMO | Admitting: Psychology

## 2024-01-24 DIAGNOSIS — F3181 Bipolar II disorder: Secondary | ICD-10-CM | POA: Diagnosis not present

## 2024-01-24 NOTE — Progress Notes (Signed)
[facility] Counselor/Therapist Progress Note  Patient ID: [name] [name], MRN: [id number],    Date: 01/24/2024  Time Spent: 53 minutes  Time in: 2:00 Time out 2: 53  Treatment Type: Individual Therapy  Reported Symptoms: none  Mental Status Exam: Appearance:  Casual     Behavior: Appropriate  Motor: Normal  Speech/Language:  normal  Affect: Appropriate  Mood: pleasant  Thought process: normal  Thought content:   WNL  Sensory/Perceptual disturbances:   WNL  Orientation: oriented to person, place, time/date, and situation  Attention: Good  Concentration: Good  Memory: WNL  Fund of knowledge:  Good  Insight:   Good  Judgment:  Good  Impulse Control: Good   Risk Assessment: Danger to Self:  No Self-injurious Behavior: No Danger to Others: No Duty to Warn:no Physical Aggression / Violence:No  Access to Firearms a concern: No  Gang Involvement:No   Subjective: The patient attended a face-to-face individual therapy session via video visit today.  The patient agreed and consented for this session to be on caregility and patient is aware of limitations of telehealth.  The patient was in her home alone and the therapist was in the office.  The patient presents with a blunted affect and mood is pleasant.  The patient reports that her husband's surgery went well and he is home and doing better now.  She did report that she spoke with her physicians after our last conversation and they did say that she could go on her trip on April 1.  She seemed very happy about that.  The patient reports that her surgery is scheduled for March 4 and her daughter is coming in on March 3 and is going to be helping her with that.  We talked about her having her plan together and it seems that she is doing much better and in addition she is trying to clean out some of the things in her house so that it is not so overwhelming when it is time for her to downsize.  The patient seems to be doing  well right now and we will continue to work with her on problem solving and cognitive behavioral therapy to help her cope with what she has to deal with with her surgery and her husband's situation. Interventions: Cognitive Behavioral Therapy, meditation and mindfulness  Diagnosis:Bipolar II disorder (HCC)  Plan: Client Abilities/Strengths  intelligent, insightful  Client Treatment Preferences  Outpatient individual therapy  Client Statement of Needs  "I need someone to help monitor and manage my bipolar"  Treatment Level  Outpatient individual therapy  Symptoms  Depressed or irritable mood.: (Status: improved). Diminished interest in or  enjoyment of activities.: (Status: improved). Displays a poor attention span and is easily distracted.:  (Status: improved). Exhibits an abnormally and  persistently elevated, expansive, or irritable mood with at least three symptoms of mania (i.e., inflated  self-esteem or grandiosity, decreased need for sleep, pressured speech, flight of ideas, distractibility,  excessive goal-directed activity or psychomotor agitation, excessive involvement in pleasurable, highrisk behavior).:  (Status: improved). Feelings of hopelessness, worthlessness, or inappropriate guilt.: (Status: improved). History of at least one hypomanic,  manic, or mixed mood episode.:  (Status: maintained). History of chronic or  recurrent depression for which the client has taken antidepressant medication, been hospitalized, had  outpatient treatment, or had a course of electroconvulsive therapy.:  (Status:  maintained). Lack of energy.: (Status: improved). Lacks follow-through in  projects, even though energy is very high, since behavior lacks discipline and  goal-directedness.:  (Status: improved). Poor concentration and indecisiveness.:  (Status: improved). Reports flight of ideas or thoughts racing.: (Status: improved). Shows evidence of a decreased need for sleep.: (Status: improved).   Sleeplessness or hypersomnia.:  (Status: improved). Social withdrawal.: (Status: improved). Suicidal thoughts and/or gestures.:   (Status: improved). The elevated mood or irritability (mania) causes marked impairment in  occupational functioning, social activities, or relationships with others.: (Status: improved). Verbalizes grandiose ideas and/or persecutory beliefs.:  (Status: improved).  Problems Addressed  Bipolar Disorder - Mania, Bipolar Disorder - Depression, Bipolar Disorder - Mania, Bipolar Disorder -  Mania, Bipolar Disorder - Depression, Bipolar Disorder - Depression, Bipolar Disorder - Mania  Goals 1. Achieve controlled behavior, moderated mood, more deliberative  speech and thought process, and a stable daily activity pattern. 2. Achieve controlled behavior, moderated mood, more deliberative  speech and thought process, and a stable daily activity pattern. 3. Alleviate manic/hypomanic mood and return to previous level of  effective functioning. 4. Develop healthy cognitive patterns and beliefs about self and the world  that lead to alleviation and help prevent the relapse of  manic/hypomanic episodes. Objective Maintain a pattern of regular rhythm to daily activities. Target Date: 2024-05-07 Frequency: Monthly Progress: 80 Modality: individual Related Interventions 1. Assist the client in establishing a more routine pattern of daily activities such as sleeping,  eating, solitary and social activities, and exercise; use and review a form to schedule, assess,  and modify these activities so that they occur in a predictable rhythm every day. Objective Identify and replace thoughts and behaviors that trigger manic or depressive symptoms. Target Date: 2024-05-07 Frequency: Monthly Progress: 80 Modality: individual Related Interventions 1. Use cognitive therapy techniques to explore and educate the client about cognitive biases that  trigger his/her elevated or depressive  mood (see Cognitive Therapy for Bipolar Disorder by  Aggie Hacker al.). 2. Teach the client cognitive-behavioral coping and relapse prevention skills including delaying  impulsive actions, structured scheduling of daily activities, keeping a regular sleep routine,  avoiding unrealistic goal striving, using relaxation procedures, identifying and avoiding episode triggers such as stimulant drug use, alcohol consumption, breaking sleep routine, or exposing  self to high stress (see Cognitive Therapy for Bipolar Disorder by Aggie Hacker al.). 3. Use cognitive therapy techniques to explore and educate the client's about cognitive biases that  trigger his/her elevated or depressive mood (see Cognitive Therapy for Bipolar Disorder by  Aggie Hacker al.). Objective Discuss and resolve troubling personal and interpersonal issues. Target Date: 2024-05-07 Frequency: Monthly Progress: 60 Modality: individual Objective Participate in periodic "maintenance" sessions. Target Date: 2024-05-07 Frequency: Monthly Progress: 80 Modality: individual Related Interventions 1. Hold periodic "maintenance" sessions within the first few months after therapy to facilitate the  client's positive changes; problem-solve obstacles to improvement. 2. Hold periodic "maintenance" sessions within the first few months after therapy to facilitate the  client's positive changes; problem-solve obstacles to improvement. Objective Describe mood state, energy level, amount of control over thoughts, and sleeping pattern. Target Date: 2024-05-07 Frequency: Monthly Progress: 80 Modality: individual Related Interventions 1. Encourage the client to share his/her thoughts and feelings; express empathy and build rapport  while assessing primary cognitive, behavioral, interpersonal, or other symptoms of the mood  disorder. 2. Assess presence, severity, and impact of past and present mood episodes on social,  occupational, and interpersonal functioning;  supplement with semi-structured inventory, if  desired (e.g., Montgomery-Asberg Depression Rating Scale, Inventory to Diagnose  Depression). 3. Encourage the client to share his/her thoughts and feelings; express  empathy, and build rapport  while assessing primary cognitive, behavioral, interpersonal, or other symptoms of the mood  disorder. 4. Assess presence, severity, and impact of past and present mood episodes including mania (i.e.,  pressured speech, impulsive behavior, euphoric mood, flight of ideas, reduced need for sleep,  inflated self-esteem, and high energy) on social, occupational, and interpersonal functioning;  supplement with semi-structured inventory, if desired (e.g., Young Mania Rating Scale; the  Clinical Monitoring Form); re-administer as indicated to assess treatment response. 5. Develop healthy cognitive patterns and beliefs about self and the world  that lead to alleviation and help prevent the relapse of mood  episodes. 6. Normalize energy level and return to usual activities, good judgment,  stable mood, more realistic expectations, and goal-directed behavior. 7. Normalize energy level and return to usual activities, good judgment,  stable mood, more realistic expectations, and goal-directed behavior. Diagnosis 296.52 (Bipolar I disorder, most recent episode (or current) depressed, moderate)  Medications  1. Ambien (Dosage: 10mg  qhs)  2. Klonopin (Dosage: 1mg  TID)  3. Olanzapine (Dosage: 5 mg every other day and not on weekends)  4. Prozac (Dosage: 20 mg every other day and not on weekends)  5. Wellbutrin (Dosage: 450 mg qd)  Conditions For Discharge Achievement of treatment goals and objectives  The patient approved this treatment plan and is making progress towards goals.  [employer]on [employer] G Dijuan Sleeth, LCSW

## 2024-01-28 ENCOUNTER — Other Ambulatory Visit: Payer: Self-pay | Admitting: Internal Medicine

## 2024-01-28 DIAGNOSIS — R0602 Shortness of breath: Secondary | ICD-10-CM

## 2024-01-28 DIAGNOSIS — I272 Pulmonary hypertension, unspecified: Secondary | ICD-10-CM

## 2024-01-29 ENCOUNTER — Telehealth: Payer: Self-pay | Admitting: *Deleted

## 2024-01-29 NOTE — Telephone Encounter (Signed)
Per Dr Alvester Morin fax records and surgical optimization form to the cardiology office. (Dr Juliann Pares [phone])

## 2024-01-30 NOTE — Progress Notes (Addendum)
COVID Vaccine received:  []  No [x]  Yes Date of any COVID positive Test in last 90 days:  PCP - Roxy Manns, MD  Cardiologist - Dr. Dorothyann Peng  cardiac clearance in 01-21-24 [facility] note.  [phone] (Work)[phone] (Fax)  Nephrology- Mosetta Pigeon, MD [facility] in [place] [phone] (Work) [phone] (Fax)   Chest x-ray - 10-29-2023  2v  Epic EKG -01-10-2024  Epic   Stress Test - Myoview  10-23-2023  Epic ECHO - 01-10-2024  TEE  Epic Cardiac Cath - [facility] 11-25-23  Epic  PCR screen: []  Ordered & Completed []   No Order but Needs PROFEND     [x]   N/A for this surgery  Surgery Plan:  [x]  Ambulatory   []  Outpatient in bed  []  Admit Anesthesia:    []  General  []  Spinal  [x]   Choice []   MAC  Pacemaker / ICD device [x]  No []  Yes   Spinal Cord Stimulator:[x]  No []  Yes       History of Sleep Apnea? []  No []  Yes  ? CPAP used?- [x]  No []  Yes    Does the patient monitor blood sugar?   []  N/A   []  No []  Yes  Patient has: []  NO Hx DM   [x]  Pre-DM   []  DM1  []   DM2 Last A1c was: 5.7  on   12-16-2023    Blood Thinner / Instructions:  Eliquis  - Hold x 2 days per Dr. Brooten Blas staff message below.  Aspirin Instructions:  ASA 81 mg    ERAS Protocol Ordered: []  No  [x]  Yes PRE-SURGERY []  ENSURE  []  G2   [x]  No Drink Ordered Patient is to be NPO after:  0430   Dental hx: []  Dentures:  []  N/A      []  Bridge or Partial:                   []  Loose or Damaged teeth:    Activity level: Patient is able / unable to climb a flight of stairs without difficulty; []  No CP  []  No SOB, but would have ___   Patient can / can not perform ADLs without assistance.   Anesthesia review: A.fib (DCCV on 01-10-2024), Pre-DM, Bipolar 1, CAD- NSTEMI 10-22-2023, anemia, PAH, CKD3a, HOH-  Patient denies shortness of breath, fever, cough and chest pain at PAT appointment.  Patient verbalized understanding and agreement to the Pre-Surgical Instructions that were given to them at this PAT  appointment. Patient was also educated of the need to review these PAT instructions again prior to her surgery.I reviewed the appropriate phone numbers to call if they have any and questions or concerns.

## 2024-01-30 NOTE — Patient Instructions (Addendum)
SURGICAL WAITING ROOM VISITATION Patients having surgery or a procedure may have no more than 2 support people in the waiting area - these visitors may rotate in the visitor waiting room.   Due to an increase in RSV and influenza rates and associated hospitalizations, children ages 40 and under may not visit patients in [facility] hospitals. If the patient needs to stay at the hospital during part of their recovery, the visitor guidelines for inpatient rooms apply.  PRE-OP VISITATION  Pre-op nurse will coordinate an appropriate time for 1 support person to accompany the patient in pre-op.  This support person may not rotate.  This visitor will be contacted when the time is appropriate for the visitor to come back in the pre-op area.  Please refer to the [facility] website for the visitor guidelines for Inpatients (after your surgery is over and you are in a regular room).  You are not required to quarantine at this time prior to your surgery. However, you must do this: Hand Hygiene often Do NOT share personal items Notify your provider if you are in close contact with someone who has COVID or you develop fever 100.4 or greater, new onset of sneezing, cough, sore throat, shortness of breath or body aches.  If you test positive for Covid or have been in contact with anyone that has tested positive in the last 10 days please notify you surgeon.    Your procedure is scheduled on:  TUESDAY  February 11, 2024  Report to [facility] Main Entrance: Leota Jacobsen entrance where the [employer] is available.   Report to admitting at:  05:15   AM  Call this number if you have any questions or problems the morning of surgery [phone]  FOLLOW ANY ADDITIONAL PRE OP INSTRUCTIONS YOU RECEIVED FROM YOUR SURGEON'S OFFICE!!!  Do not eat food after Midnight the night prior to your surgery/procedure.  After Midnight you may have the following liquids until   04:30 AM DAY OF SURGERY  Clear Liquid  Diet Water Black Coffee (sugar ok, NO MILK/CREAM OR CREAMERS)  Tea (sugar ok, NO MILK/CREAM OR CREAMERS) regular and decaf                             Plain Jell-O  with no fruit (NO RED)                                           Fruit ices (not with fruit pulp, NO RED)                                     Popsicles (NO RED)                                                                  Juice: NO CITRUS JUICES: only apple, WHITE grape, WHITE cranberry Sports drinks like Gatorade or Powerade (NO RED)                Oral Hygiene is also important to reduce your risk  of infection.        Remember - BRUSH YOUR TEETH THE MORNING OF SURGERY WITH YOUR REGULAR TOOTHPASTE  Do NOT smoke after Midnight the night before surgery.  STOP TAKING all Vitamins, Herbs and supplements 1 week before your surgery.   Take ONLY these medicines the morning of surgery with A SIP OF WATER: famotidine, levothyroxine, cetirizine (Zyrtec), Bupropion and Tylenol if needed for pain. You may use your Flonase nasal spray if needed.                    You may not have any metal on your body including hair pins, jewelry, and body piercing  Do not wear make-up, lotions, powders, perfumes or deodorant  Do not wear nail polish including gel and S&S, artificial / acrylic nails, or any other type of covering on natural nails including finger and toenails. If you have artificial nails, gel coating, etc., that needs to be removed by a nail salon, Please have this removed prior to surgery. Not doing so may mean that your surgery could be cancelled or delayed if the Surgeon or anesthesia staff feels like they are unable to monitor you safely.   Do not shave 48 hours prior to surgery to avoid nicks in your skin which may contribute to postoperative infections.    Contacts, Hearing Aids, dentures or bridgework may not be worn into surgery. DENTURES WILL BE REMOVED PRIOR TO SURGERY PLEASE DO NOT APPLY "Poly grip" OR  ADHESIVES!!!  Patients discharged on the day of surgery will not be allowed to drive home.  Someone NEEDS to stay with you for the first 24 hours after anesthesia.  Do not bring your home medications to the hospital. The Pharmacy will dispense medications listed on your medication list to you during your admission in the Hospital.  Special Instructions: Bring a copy of your healthcare power of attorney and living will documents the day of surgery, if you wish to have them scanned into your [facility]- EPIC  Please read over the following fact sheets you were given: IF YOU HAVE QUESTIONS ABOUT YOUR PRE-OP INSTRUCTIONS, PLEASE CALL [phone]   [facility] - Preparing for Surgery Before surgery, you can play an important role.  Because skin is not sterile, your skin needs to be as free of germs as possible.  You can reduce the number of germs on your skin by washing with CHG (chlorahexidine gluconate) soap before surgery.  CHG is an antiseptic cleaner which kills germs and bonds with the skin to continue killing germs even after washing. Please DO NOT use if you have an allergy to CHG or antibacterial soaps.  If your skin becomes reddened/irritated stop using the CHG and inform your nurse when you arrive at Short Stay. Do not shave (including legs and underarms) for at least 48 hours prior to the first CHG shower.  You may shave your face/neck.  Please follow these instructions carefully:  1.  Shower with CHG Soap the night before surgery and the  morning of surgery.  2.  If you choose to wash your hair, wash your hair first as usual with your normal  shampoo.  3.  After you shampoo, rinse your hair and body thoroughly to remove the shampoo.                             4.  Use CHG as you would any other liquid soap.  You can apply chg directly to the skin and wash.  Gently with a scrungie or clean washcloth.  5.  Apply the CHG Soap to your body ONLY FROM THE NECK DOWN.   Do not  use on face/ open                           Wound or open sores. Avoid contact with eyes, ears mouth and genitals (private parts).                       Wash face,  Genitals (private parts) with your normal soap.             6.  Wash thoroughly, paying special attention to the area where your  surgery  will be performed.  7.  Thoroughly rinse your body with warm water from the neck down.  8.  DO NOT shower/wash with your normal soap after using and rinsing off the CHG Soap.            9.  Pat yourself dry with a clean towel.            10.  Wear clean pajamas.            11.  Place clean sheets on your bed the night of your first shower and do not  sleep with pets.  ON THE DAY OF SURGERY : Do not apply any lotions/deodorants the morning of surgery.  Please wear clean clothes to the hospital/surgery center.     FAILURE TO FOLLOW THESE INSTRUCTIONS MAY RESULT IN THE CANCELLATION OF YOUR SURGERY  PATIENT SIGNATURE_________________________________  NURSE SIGNATURE__________________________________  ________________________________________________________________________

## 2024-01-31 ENCOUNTER — Telehealth: Payer: Self-pay

## 2024-01-31 ENCOUNTER — Encounter (HOSPITAL_COMMUNITY): Payer: Self-pay

## 2024-01-31 ENCOUNTER — Encounter (HOSPITAL_COMMUNITY)
Admission: RE | Admit: 2024-01-31 | Discharge: 2024-01-31 | Disposition: A | Discharge status: Home / Self Care | Payer: Medicare HMO | Source: Ambulatory Visit | Attending: Psychiatry | Admitting: Psychiatry

## 2024-01-31 ENCOUNTER — Other Ambulatory Visit: Payer: Self-pay

## 2024-01-31 VITALS — BP 155/68 | HR 55 | Temp 98.3°F | Resp 16 | Ht 61.0 in | Wt 205.0 lb

## 2024-01-31 DIAGNOSIS — C519 Malignant neoplasm of vulva, unspecified: Secondary | ICD-10-CM | POA: Diagnosis not present

## 2024-01-31 DIAGNOSIS — I272 Pulmonary hypertension, unspecified: Secondary | ICD-10-CM | POA: Insufficient documentation

## 2024-01-31 DIAGNOSIS — Z01818 Encounter for other preprocedural examination: Secondary | ICD-10-CM

## 2024-01-31 DIAGNOSIS — Z01812 Encounter for preprocedural laboratory examination: Secondary | ICD-10-CM | POA: Diagnosis not present

## 2024-01-31 DIAGNOSIS — I130 Hypertensive heart and chronic kidney disease with heart failure and stage 1 through stage 4 chronic kidney disease, or unspecified chronic kidney disease: Secondary | ICD-10-CM | POA: Insufficient documentation

## 2024-01-31 DIAGNOSIS — I2510 Atherosclerotic heart disease of native coronary artery without angina pectoris: Secondary | ICD-10-CM | POA: Diagnosis not present

## 2024-01-31 DIAGNOSIS — I509 Heart failure, unspecified: Secondary | ICD-10-CM | POA: Insufficient documentation

## 2024-01-31 DIAGNOSIS — F319 Bipolar disorder, unspecified: Secondary | ICD-10-CM | POA: Insufficient documentation

## 2024-01-31 DIAGNOSIS — N183 Chronic kidney disease, stage 3 unspecified: Secondary | ICD-10-CM | POA: Diagnosis not present

## 2024-01-31 DIAGNOSIS — G4733 Obstructive sleep apnea (adult) (pediatric): Secondary | ICD-10-CM | POA: Insufficient documentation

## 2024-01-31 DIAGNOSIS — E039 Hypothyroidism, unspecified: Secondary | ICD-10-CM | POA: Insufficient documentation

## 2024-01-31 HISTORY — DX: Pneumonia, unspecified organism: J18.9

## 2024-01-31 HISTORY — DX: Dyspnea, unspecified: R06.00

## 2024-01-31 HISTORY — DX: Chronic kidney disease, unspecified: N18.9

## 2024-01-31 LAB — CBC
HCT: 37.7 % (ref 36.0–46.0)
Hemoglobin: 11.9 g/dL — ABNORMAL LOW (ref 12.0–15.0)
MCH: 28 pg (ref 26.0–34.0)
MCHC: 31.6 g/dL (ref 30.0–36.0)
MCV: 88.7 fL (ref 80.0–100.0)
Platelets: 233 10*3/uL (ref 150–400)
RBC: 4.25 MIL/uL (ref 3.87–5.11)
RDW: 14.2 % (ref 11.5–15.5)
WBC: 5.7 10*3/uL (ref 4.0–10.5)
nRBC: 0 % (ref 0.0–0.2)

## 2024-01-31 LAB — BASIC METABOLIC PANEL
Anion gap: 10 (ref 5–15)
BUN: 18 mg/dL (ref 8–23)
CO2: 28 mmol/L (ref 22–32)
Calcium: 9.7 mg/dL (ref 8.9–10.3)
Chloride: 101 mmol/L (ref 98–111)
Creatinine, Ser: 1.28 mg/dL — ABNORMAL HIGH (ref 0.44–1.00)
GFR, Estimated: 46 mL/min — ABNORMAL LOW (ref >60)
Glucose, Bld: 106 mg/dL — ABNORMAL HIGH (ref 70–99)
Potassium: 4 mmol/L (ref 3.5–5.1)
Sodium: 139 mmol/L (ref 135–145)

## 2024-01-31 NOTE — Telephone Encounter (Signed)
Medicare Medication Therapy Management program form received,via fax, from [place].   Information reviewed by [name] NP and faxed back to number provided [phone]

## 2024-02-03 ENCOUNTER — Ambulatory Visit
Admission: RE | Admit: 2024-02-03 | Discharge: 2024-02-03 | Disposition: A | Discharge status: Home / Self Care | Payer: Medicare HMO | Source: Ambulatory Visit | Attending: Internal Medicine | Admitting: Internal Medicine

## 2024-02-03 DIAGNOSIS — R0602 Shortness of breath: Secondary | ICD-10-CM

## 2024-02-03 DIAGNOSIS — I272 Pulmonary hypertension, unspecified: Secondary | ICD-10-CM | POA: Diagnosis not present

## 2024-02-03 DIAGNOSIS — I700 Atherosclerosis of aorta: Secondary | ICD-10-CM | POA: Diagnosis not present

## 2024-02-03 DIAGNOSIS — R918 Other nonspecific abnormal finding of lung field: Secondary | ICD-10-CM | POA: Diagnosis not present

## 2024-02-03 MED ORDER — IOHEXOL 300 MG/ML  SOLN
75.0000 mL | Freq: Once | INTRAMUSCULAR | Status: AC | PRN
  Administered 2024-02-03: 75 mL via INTRAVENOUS

## 2024-02-03 NOTE — Progress Notes (Signed)
 Anesthesia Chart Review   Case: [id number] Date/Time: 02/11/24 0715   Procedure: WIDE EXCISION VULVECTOMY   Anesthesia type: Choice   Pre-op diagnosis: VULVAR MELANOMA IN SITU   Location[facility] ROOM 05 / [facility]   Surgeons: [name], MD       DISCUSSION:67 y.o. never smoker with h/o Bipolar I, hypothyroidism, OSA, CAD, CHF, pulmonary hypertension, atrial fibrillation, CKD Stage III, vulvar melanoma scheduled for above procedure 02/11/2024 with Dr. [name].   Pt last seen by cardiology 01/21/2024. Per OV note, "Preop clearance for vulvar melanoma resection patient is moderate risk for the procedure no modifications necessary to mitigate her risk"  Echo 01/10/2024 IMPRESSIONS     1. No thrombus within the left atrium or left atrial appendage   2. Mild LV systolic dysfunction   3. Mild mitral regurgitation   4. Moderate tricuspid regurgitation   5. Moderately elevated estimated RVSP   6. Biatiral enlargement   Pt advised to hold Eliquis 2 days prior to procedure.  VS: BP (!) 155/68   Pulse (!) 55   Temp 36.8 C (Oral)   Resp 16   Ht 5\' 1"  (1.549 m)   Wt 93 kg   SpO2 99%   BMI 38.73 kg/m   PROVIDERS: Tower, Audrie Gallus, MD is PCP   Cardiologist - Dr. Dorothyann Peng   LABS: Labs reviewed: Acceptable for surgery. (all labs ordered are listed, but only abnormal results are displayed)  Labs Reviewed  BASIC METABOLIC PANEL - Abnormal; Notable for the following components:      Result Value   Glucose, Bld 106 (*)    Creatinine, Ser 1.28 (*)    GFR, Estimated 46 (*)    All other components within normal limits  CBC - Abnormal; Notable for the following components:   Hemoglobin 11.9 (*)    All other components within normal limits     IMAGES:   EKG:   CV: Echo 01/10/2024 IMPRESSIONS     1. No thrombus within the left atrium or left atrial appendage   2. Mild LV systolic dysfunction   3. Mild mitral regurgitation   4. Moderate tricuspid regurgitation    5. Moderately elevated estimated RVSP   6. Biatiral enlargement   Cardiac Cath 11/25/2023   The left ventricular systolic function is normal.   LV end diastolic pressure is normal.   LV end diastolic pressure is normal.   The left ventricular ejection fraction is 55-65% by visual estimate.   Hemodynamic findings consistent with mild pulmonary hypertension.   No indication for antiplatelet therapy at this time .   Conclusion   Right left heart cath   Mean wedge 19 Mean PA 30 Cardiac output Fick 6.3   Left ventriculogram Normal left ventricular function EF of 60% normal wall motion   Coronaries Normal coronaries Right dominant system   Patient tolerated procedure well No complications Past Medical History:  Diagnosis Date   Actinic keratosis 03/29/2021   R central upper lip   Allergic rhinitis    Allergy    Anemia 10/29/2023   Anxiety    Arthritis    Atypical mole 12/26/2021   spinal upper back, exc 01/15/22   Basal cell carcinoma 10/16/2010   R lower pretibia    Bipolar 1 disorder (HCC)    Chronic kidney disease    Chronic kidney disease    Depression    Dyspnea    Fissure, anal    GERD (gastroesophageal reflux disease)    History  of basal cell carcinoma excision    ON FACE   History of squamous cell carcinoma excision    ON RIGHT LEG   Hyperlipidemia    Hypothyroidism    IBS (irritable bowel syndrome)    Liver cyst    Malignant melanoma in situ (HCC) 11/20/2022   Right labia minora. Excision 12/25/2022 at anterior vulva, left of clitoral hood, margins involved. Surgery scheduled for 02/12/2023.   Mallet finger of left hand    small finger   NSTEMI (non-ST elevated myocardial infarction) (HCC) 10/22/2023   Pneumonia    Pre-diabetes     Past Surgical History:  Procedure Laterality Date   ABLATION     CARDIAC CATHETERIZATION  04/10/1999   per pt normal   CARDIOVASCULAR STRESS TEST  02-23-2011  dr Iantha Fallen [facility] clinic)   normal nuclear study/   no ischemia/  ef 64%   CARDIOVERSION N/A 01/10/2024   Procedure: CARDIOVERSION;  Surgeon: Tiajuana Amass, MD;  Location: [facility] ORS;  Service: Cardiovascular;  Laterality: N/A;   CATARACT EXTRACTION W/ INTRAOCULAR LENS  IMPLANT, BILATERAL  12/10/2001   Bil   CESAREAN SECTION  12/10/1982   CLOSED REDUCTION FINGER WITH PERCUTANEOUS PINNING Left 04/22/2014   Procedure: LEFT SMALL FINGER CLOSED REDUCTION PINNING;  Surgeon: Sharma Covert, MD;  Location: [facility];  Service: Orthopedics;  Laterality: Left;   COLONOSCOPY  11/09/2002   DILATION AND CURETTAGE OF UTERUS  1977   LAPAROSCOPIC CHOLECYSTECTOMY  03/18/2000   RECONSTRUCTION OF EYELID Bilateral 08/2022   RIGHT/LEFT HEART CATH AND CORONARY ANGIOGRAPHY Bilateral 11/25/2023   Procedure: RIGHT/LEFT HEART CATH AND CORONARY ANGIOGRAPHY;  Surgeon: Alwyn Pea, MD;  Location: [facility] INVASIVE CV LAB;  Service: Cardiovascular;  Laterality: Bilateral;   TEE WITHOUT CARDIOVERSION N/A 01/10/2024   Procedure: TRANSESOPHAGEAL ECHOCARDIOGRAM (TEE);  Surgeon: Tiajuana Amass, MD;  Location: [facility] ORS;  Service: Cardiovascular;  Laterality: N/A;   TRANSTHORACIC ECHOCARDIOGRAM  02/23/2011   normal lvf/  ef 58%/  mild rve/   mild lae/  mild  mr  & tr/  mild pulmonary htn   VULVECTOMY N/A 12/25/2022   Procedure: PARTIAL  VULVECTOMY;  Surgeon: [name], MD;  Location: [facility];  Service: Gynecology;  Laterality: N/A;   VULVECTOMY N/A 02/12/2023   Procedure: WIDE LOCAL EXCISION OF VULVA AND REMOVAL OF CLITORIS;  Surgeon: [name], MD;  Location: [facility];  Service: Gynecology;  Laterality: N/A;   WISDOM TOOTH EXTRACTION      MEDICATIONS:  acetaminophen (TYLENOL) 650 MG CR tablet   apixaban (ELIQUIS) 5 MG TABS tablet   ARIPiprazole (ABILIFY) 10 MG tablet   aspirin EC 81 MG tablet   Biotin 5000 MCG CAPS   buPROPion (WELLBUTRIN XL) 300 MG 24 hr tablet   cetirizine (ZYRTEC ALLERGY) 10 MG  tablet   cholecalciferol (VITAMIN D3) 25 MCG (1000 UNIT) tablet   clonazePAM (KLONOPIN) 1 MG tablet   Coenzyme Q10 (COQ10) 100 MG CAPS   famotidine (PEPCID) 20 MG tablet   fluticasone (FLONASE) 50 MCG/ACT nasal spray   lamoTRIgine (LAMICTAL) 200 MG tablet   levothyroxine (SYNTHROID) 50 MCG tablet   lisinopril (ZESTRIL) 2.5 MG tablet   lovastatin (MEVACOR) 40 MG tablet   nystatin (MYCOSTATIN/NYSTOP) powder   senna-docusate (SENOKOT-S) 8.6-50 MG tablet   torsemide (DEMADEX) 20 MG tablet   traMADol (ULTRAM) 50 MG tablet   zolpidem (AMBIEN) 10 MG tablet   No current facility-administered medications for this encounter.     Jodell Cipro  Ward, PA-C [facility] Pre-Surgical Testing [phone]

## 2024-02-05 DIAGNOSIS — I480 Paroxysmal atrial fibrillation: Secondary | ICD-10-CM | POA: Diagnosis not present

## 2024-02-10 ENCOUNTER — Telehealth: Payer: Self-pay | Admitting: *Deleted

## 2024-02-10 NOTE — Telephone Encounter (Signed)
 Spoke with [name] [name] who states her last dose of Eliquis was Saturday, March 1st. Pt is scheduled for surgery on Tuesday, March 4th.

## 2024-02-10 NOTE — Anesthesia Preprocedure Evaluation (Signed)
 Anesthesia Evaluation  Patient identified by MRN, date of birth, ID band Patient awake    Reviewed: Allergy & Precautions, NPO status , Patient's Chart, lab work & pertinent test results  Airway Mallampati: III  TM Distance: <3 FB Neck ROM: Full   Comment: ANTERIOR Dental no notable dental hx. 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)     Musculoskeletal  (+) Arthritis ,    Abdominal   Peds  Hematology   Anesthesia Other Findings All: Tegretol, codeine, lithium, amantidine,  Reproductive/Obstetrics negative OB ROS                             Anesthesia Physical Anesthesia Plan  ASA: 3  Anesthesia Plan: General   Post-op Pain Management: Ketamine IV* and Tylenol PO (pre-op)*   Induction: Intravenous  PONV Risk Score and Plan: Treatment may vary due to age or medical condition, Midazolam, Ondansetron and Dexamethasone  Airway Management Planned: LMA  Additional Equipment: None  Intra-op Plan:   Post-operative Plan: Extubation in OR  Informed Consent: I have reviewed the patients History and Physical, chart, labs and discussed the procedure including the risks, benefits and alternatives for the proposed anesthesia with the patient or authorized representative who has indicated his/her understanding and acceptance.     Dental advisory given  Plan Discussed with: Anesthesiologist and CRNA  Anesthesia Plan Comments: (DISCUSSION:67 y.o. never smoker with h/o Bipolar I, hypothyroidism, OSA, CAD,  CHF, pulmonary hypertension, atrial fibrillation, CKD Stage III, vulvar melanoma scheduled for above procedure 02/11/2024 with Dr. [name].    Pt last seen by cardiology 01/21/2024. Per OV note, "Preop clearance for vulvar melanoma resection patient is moderate risk for the procedure no modifications necessary to mitigate her risk"   Echo 01/10/2024 IMPRESSIONS  1. No thrombus within the left atrium or left atrial appendage   2. Mild LV systolic dysfunction   3. Mild mitral regurgitation   4. Moderate tricuspid regurgitation   5. Moderately elevated estimated RVSP   6. Biatiral enlargement  Cardiac Cath 11/25/2023   The left ventricular systolic function is normal.   LV end diastolic pressure is normal.   LV end diastolic pressure is normal.   The left ventricular ejection fraction is 55-65% by visual estimate.   Hemodynamic findings consistent with mild pulmonary hypertension.   No indication for antiplatelet therapy at this time .  Cardiac Cath 11/25/2023   The left ventricular systolic function is normal.   LV end diastolic pressure is normal.   LV end diastolic pressure is normal.   The left ventricular ejection fraction is 55-65% by visual estimate.   Hemodynamic findings consistent with mild pulmonary hypertension.   No indication for antiplatelet therapy at this time .  )       Anesthesia Quick Evaluation

## 2024-02-10 NOTE — Telephone Encounter (Signed)
 Telephone call to check on pre-operative status.  Patient compliant with pre-operative instructions.  Reinforced nothing to eat after midnight. Clear liquids until 0415. Patient to arrive at 0515.  No questions or concerns voiced.  Instructed to call for any needs.

## 2024-02-11 ENCOUNTER — Ambulatory Visit (HOSPITAL_COMMUNITY)
Admission: RE | Admit: 2024-02-11 | Discharge: 2024-02-11 | Disposition: A | Discharge status: Home / Self Care | Payer: Medicare HMO | Attending: Psychiatry | Admitting: Psychiatry

## 2024-02-11 ENCOUNTER — Other Ambulatory Visit: Payer: Self-pay

## 2024-02-11 ENCOUNTER — Ambulatory Visit (HOSPITAL_BASED_OUTPATIENT_CLINIC_OR_DEPARTMENT_OTHER): Payer: Self-pay | Admitting: Anesthesiology

## 2024-02-11 ENCOUNTER — Encounter (HOSPITAL_COMMUNITY)
Admission: RE | Disposition: A | Discharge status: Home / Self Care | Payer: Self-pay | Source: Home / Self Care | Attending: Psychiatry

## 2024-02-11 ENCOUNTER — Encounter (HOSPITAL_COMMUNITY): Payer: Self-pay | Admitting: Psychiatry

## 2024-02-11 ENCOUNTER — Ambulatory Visit (HOSPITAL_COMMUNITY): Payer: Self-pay | Admitting: Physician Assistant

## 2024-02-11 DIAGNOSIS — K219 Gastro-esophageal reflux disease without esophagitis: Secondary | ICD-10-CM | POA: Insufficient documentation

## 2024-02-11 DIAGNOSIS — E039 Hypothyroidism, unspecified: Secondary | ICD-10-CM | POA: Insufficient documentation

## 2024-02-11 DIAGNOSIS — I480 Paroxysmal atrial fibrillation: Secondary | ICD-10-CM | POA: Diagnosis not present

## 2024-02-11 DIAGNOSIS — M1990 Unspecified osteoarthritis, unspecified site: Secondary | ICD-10-CM | POA: Diagnosis not present

## 2024-02-11 DIAGNOSIS — D038 Melanoma in situ of other sites: Secondary | ICD-10-CM | POA: Diagnosis not present

## 2024-02-11 DIAGNOSIS — F319 Bipolar disorder, unspecified: Secondary | ICD-10-CM | POA: Insufficient documentation

## 2024-02-11 DIAGNOSIS — I272 Pulmonary hypertension, unspecified: Secondary | ICD-10-CM | POA: Diagnosis not present

## 2024-02-11 DIAGNOSIS — I4891 Unspecified atrial fibrillation: Secondary | ICD-10-CM | POA: Diagnosis not present

## 2024-02-11 DIAGNOSIS — Z79899 Other long term (current) drug therapy: Secondary | ICD-10-CM | POA: Insufficient documentation

## 2024-02-11 HISTORY — PX: VULVECTOMY: SHX1086

## 2024-02-11 SURGERY — WIDE EXCISION VULVECTOMY
Anesthesia: Choice

## 2024-02-11 MED ORDER — LIDOCAINE HCL (PF) 2 % IJ SOLN
INTRAMUSCULAR | Status: AC
  Filled 2024-02-11: qty 5

## 2024-02-11 MED ORDER — PHENYLEPHRINE 80 MCG/ML (10ML) SYRINGE FOR IV PUSH (FOR BLOOD PRESSURE SUPPORT)
PREFILLED_SYRINGE | INTRAVENOUS | Status: AC
  Filled 2024-02-11: qty 10

## 2024-02-11 MED ORDER — ONDANSETRON HCL 4 MG/2ML IJ SOLN
INTRAMUSCULAR | Status: DC | PRN
Start: 2024-02-11 — End: 2024-02-11
  Administered 2024-02-11: 4 mg via INTRAVENOUS

## 2024-02-11 MED ORDER — EPHEDRINE SULFATE-NACL 50-0.9 MG/10ML-% IV SOSY
PREFILLED_SYRINGE | INTRAVENOUS | Status: DC | PRN
Start: 2024-02-11 — End: 2024-02-11
  Administered 2024-02-11 (×2): 10 mg via INTRAVENOUS

## 2024-02-11 MED ORDER — LACTATED RINGERS IV SOLN: INTRAVENOUS | Status: DC

## 2024-02-11 MED ORDER — MIDAZOLAM HCL 2 MG/2ML IJ SOLN
INTRAMUSCULAR | Status: AC
  Filled 2024-02-11: qty 2

## 2024-02-11 MED ORDER — LACTATED RINGERS IV SOLN: INTRAVENOUS | Status: DC | PRN

## 2024-02-11 MED ORDER — MONSELS FERRIC SUBSULFATE EX SOLN
CUTANEOUS | Status: AC
Start: 2024-02-11 — End: ?
  Filled 2024-02-11: qty 8

## 2024-02-11 MED ORDER — CHLORHEXIDINE GLUCONATE 0.12 % MT SOLN
15.0000 mL | Freq: Once | OROMUCOSAL | Status: AC
  Administered 2024-02-11: 15 mL via OROMUCOSAL

## 2024-02-11 MED ORDER — PROPOFOL 10 MG/ML IV BOLUS
INTRAVENOUS | Status: AC
  Filled 2024-02-11: qty 20

## 2024-02-11 MED ORDER — FENTANYL CITRATE PF 50 MCG/ML IJ SOSY: 25.0000 ug | PREFILLED_SYRINGE | INTRAMUSCULAR | Status: DC | PRN

## 2024-02-11 MED ORDER — PROPOFOL 10 MG/ML IV BOLUS
INTRAVENOUS | Status: DC | PRN
Start: 2024-02-11 — End: 2024-02-11
  Administered 2024-02-11 (×2): 50 mg via INTRAVENOUS
  Administered 2024-02-11: 80 mg via INTRAVENOUS

## 2024-02-11 MED ORDER — ACETAMINOPHEN 160 MG/5ML PO SOLN: 325.0000 mg | ORAL | Status: DC | PRN

## 2024-02-11 MED ORDER — VASOPRESSIN 20 UNIT/ML IV SOLN
INTRAVENOUS | Status: AC
  Filled 2024-02-11: qty 1

## 2024-02-11 MED ORDER — ACETIC ACID 5 % SOLN
Status: AC
  Filled 2024-02-11: qty 50

## 2024-02-11 MED ORDER — ONDANSETRON HCL 4 MG/2ML IJ SOLN: 4.0000 mg | Freq: Once | INTRAMUSCULAR | Status: DC | PRN

## 2024-02-11 MED ORDER — DEXAMETHASONE SODIUM PHOSPHATE 10 MG/ML IJ SOLN
INTRAMUSCULAR | Status: DC | PRN
  Administered 2024-02-11: 8 mg via INTRAVENOUS

## 2024-02-11 MED ORDER — MIDAZOLAM HCL 2 MG/2ML IJ SOLN
INTRAMUSCULAR | Status: DC | PRN
Start: 2024-02-11 — End: 2024-02-11
  Administered 2024-02-11 (×2): 1 mg via INTRAVENOUS

## 2024-02-11 MED ORDER — BUPIVACAINE HCL 0.25 % IJ SOLN
INTRAMUSCULAR | Status: DC | PRN
  Administered 2024-02-11: 10 mL

## 2024-02-11 MED ORDER — OXYCODONE HCL 5 MG/5ML PO SOLN: 5.0000 mg | Freq: Once | ORAL | Status: DC | PRN

## 2024-02-11 MED ORDER — EPINEPHRINE PF 1 MG/ML IJ SOLN
INTRAMUSCULAR | Status: AC
  Filled 2024-02-11: qty 1

## 2024-02-11 MED ORDER — GLYCOPYRROLATE 0.2 MG/ML IJ SOLN
INTRAMUSCULAR | Status: DC | PRN
  Administered 2024-02-11: .2 mg via INTRAVENOUS

## 2024-02-11 MED ORDER — OXYCODONE HCL 5 MG PO TABS
5.0000 mg | ORAL_TABLET | Freq: Once | ORAL | Status: DC | PRN
Start: 2024-02-11 — End: 2024-02-11

## 2024-02-11 MED ORDER — ACETAMINOPHEN 500 MG PO TABS
1000.0000 mg | ORAL_TABLET | Freq: Once | ORAL | Status: DC
  Filled 2024-02-11: qty 2

## 2024-02-11 MED ORDER — ORAL CARE MOUTH RINSE: 15.0000 mL | Freq: Once | OROMUCOSAL | Status: AC

## 2024-02-11 MED ORDER — BUPIVACAINE HCL (PF) 0.25 % IJ SOLN
INTRAMUSCULAR | Status: AC
Start: 2024-02-11 — End: ?
  Filled 2024-02-11: qty 30

## 2024-02-11 MED ORDER — ACETAMINOPHEN 500 MG PO TABS: 1000.0000 mg | ORAL_TABLET | ORAL | Status: DC

## 2024-02-11 MED ORDER — HEPARIN SODIUM (PORCINE) 5000 UNIT/ML IJ SOLN
5000.0000 [IU] | INTRAMUSCULAR | Status: AC
  Administered 2024-02-11: 5000 [IU] via SUBCUTANEOUS
  Filled 2024-02-11: qty 1

## 2024-02-11 MED ORDER — FENTANYL CITRATE (PF) 100 MCG/2ML IJ SOLN
INTRAMUSCULAR | Status: AC
Start: 2024-02-11 — End: ?
  Filled 2024-02-11: qty 2

## 2024-02-11 MED ORDER — EPHEDRINE 5 MG/ML INJ
INTRAVENOUS | Status: AC
  Filled 2024-02-11: qty 5

## 2024-02-11 MED ORDER — ONDANSETRON HCL 4 MG/2ML IJ SOLN
INTRAMUSCULAR | Status: AC
  Filled 2024-02-11: qty 2

## 2024-02-11 MED ORDER — DEXAMETHASONE SODIUM PHOSPHATE 4 MG/ML IJ SOLN: 4.0000 mg | INTRAMUSCULAR | Status: DC

## 2024-02-11 MED ORDER — FENTANYL CITRATE (PF) 100 MCG/2ML IJ SOLN
INTRAMUSCULAR | Status: DC | PRN
  Administered 2024-02-11 (×2): 50 ug via INTRAVENOUS

## 2024-02-11 MED ORDER — ACETAMINOPHEN 325 MG PO TABS: 325.0000 mg | ORAL_TABLET | ORAL | Status: DC | PRN

## 2024-02-11 MED ORDER — DEXAMETHASONE SODIUM PHOSPHATE 10 MG/ML IJ SOLN
INTRAMUSCULAR | Status: AC
  Filled 2024-02-11: qty 1

## 2024-02-11 MED ORDER — MEPERIDINE HCL 50 MG/ML IJ SOLN: 6.2500 mg | INTRAMUSCULAR | Status: DC | PRN

## 2024-02-11 SURGICAL SUPPLY — 1 items: NDL HYPO 22X1.5 SAFETY MO (MISCELLANEOUS) ×1 IMPLANT

## 2024-02-11 NOTE — Interval H&P Note (Signed)
 History and Physical Interval Note:  02/11/2024 7:17 AM  [name] [name]  has presented today for surgery, with the diagnosis of VULVAR MELANOMA IN SITU.  The various methods of treatment have been discussed with the patient and family. After consideration of risks, benefits and other options for treatment, the patient has consented to  Procedure(s): WIDE EXCISION VULVECTOMY (N/A) as a surgical intervention.  The patient's history has been reviewed, patient examined, no change in status, stable for surgery.  I have reviewed the patient's chart and labs.  Questions were answered to the patient's satisfaction.     Rayder Sullenger

## 2024-02-11 NOTE — Anesthesia Postprocedure Evaluation (Signed)
 Anesthesia Post Note  Patient: [name]  Procedure(s) Performed: WIDE EXCISION VULVECTOMY     Patient location during evaluation: PACU Anesthesia Type: General Level of consciousness: awake and alert Pain management: pain level controlled Vital Signs Assessment: post-procedure vital signs reviewed and stable Respiratory status: spontaneous breathing, nonlabored ventilation, respiratory function stable and patient connected to nasal cannula oxygen Cardiovascular status: blood pressure returned to baseline and stable Postop Assessment: no apparent nausea or vomiting Anesthetic complications: no   No notable events documented.  Last Vitals:  Vitals:   02/11/24 0900 02/11/24 0915  BP: (!) 132/57 (!) 151/71  Pulse: 61 61  Resp: 12 15  Temp: (!) 36 C 36.4 C  SpO2: 100% 100%    Last Pain:  Vitals:   02/11/24 0915  TempSrc:   PainSc: 2                  Ricard Faulkner

## 2024-02-11 NOTE — Transfer of Care (Signed)
 Immediate Anesthesia Transfer of Care Note  Patient: [name] [name]  Procedure(s) Performed: WIDE EXCISION VULVECTOMY  Patient Location: PACU  Anesthesia Type:General  Level of Consciousness: awake, alert , and oriented  Airway & Oxygen Therapy: Patient Spontanous Breathing and Patient connected to face mask oxygen  Post-op Assessment: Report given to RN  Post vital signs: stable  Last Vitals:  Vitals Value Taken Time  BP 127/53 02/11/24 0830  Temp    Pulse 62 02/11/24 0833  Resp 12 02/11/24 0833  SpO2 100 % 02/11/24 0833  Vitals shown include unfiled device data.  Last Pain:  Vitals:   02/11/24 0613  TempSrc:   PainSc: 0-No pain         Complications: No notable events documented.

## 2024-02-11 NOTE — Op Note (Signed)
 GYNECOLOGIC ONCOLOGY OPERATIVE NOTE  Date of Service: 02/11/2024  Preoperative Diagnosis: Vulvar melanoma in situ  Postoperative Diagnosis: Same  Procedures: Simple partial vulvectomy  Surgeon: [name], MD  Assistants: none  Anesthesia: Choice  Estimated Blood Loss: 10 mL    Urine Output: 400 ml, clear yellow  Findings: External genitalia with white scar along anterior vulva in the midline from prior vulvectomy. No overt melanotic lesions visible. 5mm margin obtained around areas of recent prior positive biopsy sites.  Specimens:  ID Type Source Tests Collected by Time Destination  1 : Anterior Vulva - Stitch at 12 Tissue PATH Other SURGICAL PATHOLOGY [name], MD 02/11/2024 [phone]     Complications:  None  Indications for Procedure: [name] [name] is a 68 y.o. woman with a history of melanoma in situ with prior vulvectomy and with recent biopsy showing new site of melanoma in situ adjacent to prior vulvectomy scar.  Prior to the procedure, all risks, benefits, and alternatives were discussed and informed surgical consent was signed.  Procedure: Patient was taken to the operating room where general anesthesia was achieved.  She was positioned in dorsal lithotomy and prepped and draped.    A thorough exam of the vulva was done with the findings as noted above. The area to be removed was outlined with a marking pen with a 5mm margin from prior biopsy sites. A scalpel was used to incise around the lesion. Allis clamps are used to grasp the lesion and a simple vulvectomy was performed in the standard fashion with aid of cautery. After the lesion was removed, the wound bed was made hemostatic with cautery. Several deep 2-0 vicryl suture was placed to bring the wound edges together followed by 3-0 vicryl interrupted deep dermal stitches and 4-0 vicryl mattress stitches on the skin edges to re-appoximate the wound. Hemostasis was noted at the end of the procedure. The wound was  then injected with 0.25% marcaine. An in an out catheterization was performed with clear yellow urine drained.  Patient tolerated the procedure well. Sponge, lap, and instrument counts were correct.  No perioperative antibiotics were indicated for this procedure.  Patient was extubated and taken to the PACU in stable condition.  [name], MD Gynecologic Oncology

## 2024-02-11 NOTE — Discharge Instructions (Addendum)
 AFTER SURGERY INSTRUCTIONS   Return to work:  variable based on occupation   We recommend purchasing several bags of frozen green peas and dividing them into ziploc bags. You will want to keep these in the freezer and have them ready to use as ice packs to the vulvar incision. Once the ice pack is no longer cold, you can get another from the freezer. The frozen peas mold to your body better than a regular ice pack.     Okay to resume aspirin, eliquis tomorrow (02/12/24).   Activity: 1. Be up and out of the bed during the day.  Take a nap if needed.  You may walk up steps but be careful and use the hand rail.  Stair climbing will tire you more than you think, you may need to stop part way and rest.    2. No lifting or straining for 4 weeks over 10 pounds. No pushing, pulling, straining for 4 weeks.   3. No driving for minimum 24 hours after surgery but this is usually longer since you need to be able to brake safely and be off pain meds.  Do not drive if you are taking narcotic pain medicine and make sure that your reaction time has returned.    4. You can shower as soon as the next day after surgery. Shower daily. No tub baths or submerging your body in water until cleared by your surgeon. If you have the soap that was given to you by pre-surgical testing that was used before surgery, you do not need to use it afterwards because this can irritate your incisions.    5. No sexual activity and nothing in the vagina for 4 weeks.   6. You may experience vaginal spotting and discharge after surgery.  The spotting is normal but if you experience heavy bleeding, call our office.   7. Take Tylenol first for pain if you are able to take these medication and only use narcotic pain medication for severe pain not relieved by the Tylenol.  Monitor your Tylenol intake to a max of 4,000 mg in a 24 hour period.    Diet: 1. Low sodium Heart Healthy Diet is recommended but you are cleared to resume your normal  (before surgery) diet after your procedure.   2. It is safe to use a laxative, such as Miralax or Colace, if you have difficulty moving your bowels. You have been prescribed Sennakot at bedtime every evening to keep bowel movements regular and to prevent constipation.     Wound Care: 1. Keep clean and dry.  Shower daily.   Reasons to call the Doctor: Fever - Oral temperature greater than 100.4 degrees Fahrenheit Foul-smelling vaginal discharge Difficulty urinating Nausea and vomiting Increased pain at the site of the incision that is unrelieved with pain medicine. Difficulty breathing with or without chest pain New calf pain especially if only on one side Sudden, continuing increased vaginal bleeding with or without clots.   Contacts: For questions or concerns you should contact:   Dr. [name] at [phone]   [name], NP at [phone]   After Hours: call [phone] and have the GYN Oncologist paged/contacted (after 5 pm or on the weekends).   Messages sent via mychart are for non-urgent matters and are not responded to after hours so for urgent needs, please call the after hours number.

## 2024-02-11 NOTE — Anesthesia Procedure Notes (Signed)
 Procedure Name: LMA Insertion Date/Time: 02/11/2024 7:45 AM  Performed by: Micki Riley, CRNAPre-anesthesia Checklist: Patient identified, Emergency Drugs available, Suction available, Patient being monitored and Timeout performed Patient Re-evaluated:Patient Re-evaluated prior to induction Oxygen Delivery Method: Circle System Utilized Preoxygenation: Pre-oxygenation with 100% oxygen Induction Type: IV induction Ventilation: Mask ventilation without difficulty LMA: LMA inserted LMA Size: 4.0 Number of attempts: 1 Airway Equipment and Method: Bite block Placement Confirmation: positive ETCO2 and breath sounds checked- equal and bilateral Tube secured with: Tape Dental Injury: Teeth and Oropharynx as per pre-operative assessment

## 2024-02-12 ENCOUNTER — Encounter (HOSPITAL_COMMUNITY): Payer: Self-pay | Admitting: Psychiatry

## 2024-02-12 ENCOUNTER — Telehealth: Payer: Self-pay | Admitting: *Deleted

## 2024-02-12 NOTE — Telephone Encounter (Signed)
 Spoke with Ms. Dunkerson this morning. She states she is eating, drinking and urinating well. She has not had a BM yet but is passing gas. She is taking senokot as prescribed and encouraged her to drink plenty of water. She denies fever or chills. Incisions are dry and intact. She rates her pain 4/10. Her pain is controlled with tylenol.    Instructed to call office with any fever, chills, purulent drainage, uncontrolled pain or any other questions or concerns. Patient verbalizes understanding.   Pt aware of post op appointments as well as the office number [phone] and after hours number [phone] to call if she has any questions or concerns

## 2024-02-13 ENCOUNTER — Telehealth: Payer: Self-pay

## 2024-02-13 LAB — SURGICAL PATHOLOGY

## 2024-02-13 NOTE — Telephone Encounter (Signed)
 S/P Simple partial vulvectomy  on 3/4 with Dr. Alvester Morin  Pt called with a concern of having constipation. Reports not having a BM in 4 days. She has tried the [employer], Milk of Magnesium, and taking the Miralax at night only. Reports passing very little gas. She states her stomach is becoming a little crampy. Reports no N/V, eating/drinking fine, no fever/chills.   Advised her to increase the Miralax to twice a day (morning/night) with an 8oz glass of water.  Pt voiced an understanding and will take Miralax right now and start the 2x/day regimen and will stop it if stools become loose. She will monitor and aware our office will check in with her tomorrow.  Dr.Newton is out of the office and message sent to [name] NP for further advice.

## 2024-02-14 NOTE — Telephone Encounter (Signed)
 I reached out to [name] [name] this morning for a follow up call regarding post op constipation. She states she called the after hour nurse triage line last night because she was having so much cramping/pain. She was advised to increase the Milk of Magnesia. She did that and has had several BM's.No more cramping/pain.  Reports feeling much better. She is staying hydrated and eating fine. Advised to stop MOM if stools become loose.  Pt reminded of post op appointment with Dr.Newton on 3/24.   [name] NP notified.

## 2024-02-16 ENCOUNTER — Other Ambulatory Visit: Payer: Self-pay | Admitting: Family Medicine

## 2024-02-17 ENCOUNTER — Encounter: Payer: Self-pay | Admitting: Psychiatry

## 2024-02-21 ENCOUNTER — Ambulatory Visit: Payer: Medicare HMO | Admitting: Psychology

## 2024-02-21 ENCOUNTER — Telehealth: Payer: Self-pay | Admitting: *Deleted

## 2024-02-21 DIAGNOSIS — F3181 Bipolar II disorder: Secondary | ICD-10-CM

## 2024-02-21 DIAGNOSIS — G4733 Obstructive sleep apnea (adult) (pediatric): Secondary | ICD-10-CM | POA: Diagnosis not present

## 2024-02-21 NOTE — Progress Notes (Signed)
 [facility] Counselor/Therapist Progress Note  Patient ID: [name] [name], MRN: [id number],    Date: 02/21/2024  Time Spent: 63 minutes  Time in: 1:58 Time out 3:01  Treatment Type: Individual Therapy  Reported Symptoms: none  Mental Status Exam: Appearance:  Casual     Behavior: Appropriate  Motor: Normal  Speech/Language:  normal  Affect: Appropriate  Mood: pleasant  Thought process: normal  Thought content:   WNL  Sensory/Perceptual disturbances:   WNL  Orientation: oriented to person, place, time/date, and situation  Attention: Good  Concentration: Good  Memory: WNL  Fund of knowledge:  Good  Insight:   Good  Judgment:  Good  Impulse Control: Good   Risk Assessment: Danger to Self:  No Self-injurious Behavior: No Danger to Others: No Duty to Warn:no Physical Aggression / Violence:No  Access to Firearms a concern: No  Gang Involvement:No   Subjective: The patient attended a face-to-face individual therapy session via video visit today.  The patient agreed and consented for this session to be on caregility and patient is aware of limitations of telehealth.  The patient was in her home alone and the therapist was in the office.  The patient presents with a blunted affect and mood is pleasant.  The patient reports that she had her surgery on 3/ 4 and she is doing well.  She states that they got all the cancer this time and she does report some pain from having the surgery.  In addition her daughter came to her home and helped her and she felt really good about that.  She reports that her husband is back at work and he is doing well and she reports that he is having some other health issues that they are going to have to deal with.  In addition she is having to go to a cardiologist and deal with that and she has also got some problems with her ankle and we talked about having to balance all the doctor's appointments and talked about staying in the moment so that  she does not get overwhelmed by all of the health issues.  The patient does seem to be making some progress because she and her husband have been talking about the possibility of downsizing at some point in time.  In addition we talked about organizing things so that it would be easier for her daughter to manage their affairs if needed moving forward.  Provided supportive therapy to the patient and we did some problem solving.  Interventions: Cognitive Behavioral Therapy, meditation and mindfulness  Diagnosis:Bipolar II disorder (HCC)  Plan: Client Abilities/Strengths  intelligent, insightful  Client Treatment Preferences  Outpatient individual therapy  Client Statement of Needs  "I need someone to help monitor and manage my bipolar"  Treatment Level  Outpatient individual therapy  Symptoms  Depressed or irritable mood.: (Status: improved). Diminished interest in or  enjoyment of activities.: (Status: improved). Displays a poor attention span and is easily distracted.:  (Status: improved). Exhibits an abnormally and  persistently elevated, expansive, or irritable mood with at least three symptoms of mania (i.e., inflated  self-esteem or grandiosity, decreased need for sleep, pressured speech, flight of ideas, distractibility,  excessive goal-directed activity or psychomotor agitation, excessive involvement in pleasurable, highrisk behavior).:  (Status: improved). Feelings of hopelessness, worthlessness, or inappropriate guilt.: (Status: improved). History of at least one hypomanic,  manic, or mixed mood episode.:  (Status: maintained). History of chronic or  recurrent depression for which the client has taken antidepressant  medication, been hospitalized, had  outpatient treatment, or had a course of electroconvulsive therapy.:  (Status:  maintained). Lack of energy.: (Status: improved). Lacks follow-through in  projects, even though energy is very high, since behavior lacks discipline and  goal-directedness.:  (Status: improved). Poor concentration and indecisiveness.:  (Status: improved). Reports flight of ideas or thoughts racing.: (Status: improved). Shows evidence of a decreased need for sleep.: (Status: improved).  Sleeplessness or hypersomnia.:  (Status: improved). Social withdrawal.: (Status: improved). Suicidal thoughts and/or gestures.:   (Status: improved). The elevated mood or irritability (mania) causes marked impairment in  occupational functioning, social activities, or relationships with others.: (Status: improved). Verbalizes grandiose ideas and/or persecutory beliefs.:  (Status: improved).  Problems Addressed  Bipolar Disorder - Mania, Bipolar Disorder - Depression, Bipolar Disorder - Mania, Bipolar Disorder -  Mania, Bipolar Disorder - Depression, Bipolar Disorder - Depression, Bipolar Disorder - Mania  Goals 1. Achieve controlled behavior, moderated mood, more deliberative  speech and thought process, and a stable daily activity pattern. 2. Achieve controlled behavior, moderated mood, more deliberative  speech and thought process, and a stable daily activity pattern. 3. Alleviate manic/hypomanic mood and return to previous level of  effective functioning. 4. Develop healthy cognitive patterns and beliefs about self and the world  that lead to alleviation and help prevent the relapse of  manic/hypomanic episodes. Objective Maintain a pattern of regular rhythm to daily activities. Target Date: 2024-05-07 Frequency: Monthly Progress: 80 Modality: individual Related Interventions 1. Assist the client in establishing a more routine pattern of daily activities such as sleeping,  eating, solitary and social activities, and exercise; use and review a form to schedule, assess,  and modify these activities so that they occur in a predictable rhythm every day. Objective Identify and replace thoughts and behaviors that trigger manic or depressive symptoms. Target  Date: 2024-05-07 Frequency: Monthly Progress: 80 Modality: individual Related Interventions 1. Use cognitive therapy techniques to explore and educate the client about cognitive biases that  trigger his/her elevated or depressive mood (see Cognitive Therapy for Bipolar Disorder by  Aggie Hacker al.). 2. Teach the client cognitive-behavioral coping and relapse prevention skills including delaying  impulsive actions, structured scheduling of daily activities, keeping a regular sleep routine,  avoiding unrealistic goal striving, using relaxation procedures, identifying and avoiding episode triggers such as stimulant drug use, alcohol consumption, breaking sleep routine, or exposing  self to high stress (see Cognitive Therapy for Bipolar Disorder by Aggie Hacker al.). 3. Use cognitive therapy techniques to explore and educate the client's about cognitive biases that  trigger his/her elevated or depressive mood (see Cognitive Therapy for Bipolar Disorder by  Aggie Hacker al.). Objective Discuss and resolve troubling personal and interpersonal issues. Target Date: 2024-05-07 Frequency: Monthly Progress: 60 Modality: individual Objective Participate in periodic "maintenance" sessions. Target Date: 2024-05-07 Frequency: Monthly Progress: 80 Modality: individual Related Interventions 1. Hold periodic "maintenance" sessions within the first few months after therapy to facilitate the  client's positive changes; problem-solve obstacles to improvement. 2. Hold periodic "maintenance" sessions within the first few months after therapy to facilitate the  client's positive changes; problem-solve obstacles to improvement. Objective Describe mood state, energy level, amount of control over thoughts, and sleeping pattern. Target Date: 2024-05-07 Frequency: Monthly Progress: 80 Modality: individual Related Interventions 1. Encourage the client to share his/her thoughts and feelings; express empathy and build rapport   while assessing primary cognitive, behavioral, interpersonal, or other symptoms of the mood  disorder. 2. Assess presence, severity, and impact of past  and present mood episodes on social,  occupational, and interpersonal functioning; supplement with semi-structured inventory, if  desired (e.g., Montgomery-Asberg Depression Rating Scale, Inventory to Diagnose  Depression). 3. Encourage the client to share his/her thoughts and feelings; express empathy, and build rapport  while assessing primary cognitive, behavioral, interpersonal, or other symptoms of the mood  disorder. 4. Assess presence, severity, and impact of past and present mood episodes including mania (i.e.,  pressured speech, impulsive behavior, euphoric mood, flight of ideas, reduced need for sleep,  inflated self-esteem, and high energy) on social, occupational, and interpersonal functioning;  supplement with semi-structured inventory, if desired (e.g., Young Mania Rating Scale; the  Clinical Monitoring Form); re-administer as indicated to assess treatment response. 5. Develop healthy cognitive patterns and beliefs about self and the world  that lead to alleviation and help prevent the relapse of mood  episodes. 6. Normalize energy level and return to usual activities, good judgment,  stable mood, more realistic expectations, and goal-directed behavior. 7. Normalize energy level and return to usual activities, good judgment,  stable mood, more realistic expectations, and goal-directed behavior. Diagnosis 296.52 (Bipolar I disorder, most recent episode (or current) depressed, moderate)  Medications  1. Ambien (Dosage: 10mg  qhs)  2. Klonopin (Dosage: 1mg  TID)  3. Olanzapine (Dosage: 5 mg every other day and not on weekends)  4. Prozac (Dosage: 20 mg every other day and not on weekends)  5. Wellbutrin (Dosage: 450 mg qd)  Conditions For Discharge Achievement of treatment goals and objectives  The patient approved this  treatment plan and is making progress towards goals.  Terea Neubauer G Dolph Tavano, LCSW

## 2024-02-21 NOTE — Telephone Encounter (Signed)
 Spoke with Ms. Middlekauff who called the office stating she believes she popped a stitch. Pt is 10 days post simple partial vulvectomy. Pt is complaining of dark red bleeding, she is wearing a panty liner and changing it 4 times daily but the panty liners are not saturated, the blood is spotty only with strands about 1-2" in length. Pt denies discharge, states the area is red and swollen but doesn't look infected. Pt also denies fever and or chills and is taking tylenol for pain.   [name], NP was consulted and patient was given an appt. For Monday, March 17 th. At 2:30 pm. Pt agreed to date and time and advised if her pain becomes worse and the bleeding increases to call the after hours number and ask for the doctor on call over the weekend. Pt verbalized understanding and thanked the office.

## 2024-02-24 ENCOUNTER — Ambulatory Visit (HOSPITAL_COMMUNITY)
Admission: RE | Admit: 2024-02-24 | Discharge: 2024-02-24 | Disposition: A | Discharge status: Home / Self Care | Source: Ambulatory Visit | Attending: Gynecologic Oncology | Admitting: Gynecologic Oncology

## 2024-02-24 ENCOUNTER — Inpatient Hospital Stay: Attending: Gynecologic Oncology | Admitting: Gynecologic Oncology

## 2024-02-24 ENCOUNTER — Telehealth: Payer: Self-pay

## 2024-02-24 ENCOUNTER — Ambulatory Visit: Payer: Medicare HMO | Admitting: Dermatology

## 2024-02-24 VITALS — BP 138/63 | HR 60 | Temp 98.9°F | Resp 16 | Ht 61.0 in | Wt 202.0 lb

## 2024-02-24 DIAGNOSIS — M79605 Pain in left leg: Secondary | ICD-10-CM | POA: Diagnosis not present

## 2024-02-24 DIAGNOSIS — M7989 Other specified soft tissue disorders: Secondary | ICD-10-CM | POA: Insufficient documentation

## 2024-02-24 DIAGNOSIS — D038 Melanoma in situ of other sites: Secondary | ICD-10-CM

## 2024-02-24 DIAGNOSIS — T8131XA Disruption of external operation (surgical) wound, not elsewhere classified, initial encounter: Secondary | ICD-10-CM

## 2024-02-24 DIAGNOSIS — Z9079 Acquired absence of other genital organ(s): Secondary | ICD-10-CM

## 2024-02-24 DIAGNOSIS — R600 Localized edema: Secondary | ICD-10-CM

## 2024-02-24 DIAGNOSIS — Z9889 Other specified postprocedural states: Secondary | ICD-10-CM | POA: Insufficient documentation

## 2024-02-24 DIAGNOSIS — C519 Malignant neoplasm of vulva, unspecified: Secondary | ICD-10-CM | POA: Insufficient documentation

## 2024-02-24 NOTE — Telephone Encounter (Signed)
 Per [name] NP, [name] [name] is aware of recent doppler for DVT is negative. [name] [name] was thankful for the call.

## 2024-02-24 NOTE — Progress Notes (Signed)
 GYN Oncology Post-operative Symptom Management Visit  02/24/2024  Reason for Visit: Evaluation of vulvar incision, reported incisional dark red drainage, incisional erythema  HPI: Pt presented to her Ob/Gyn for evaluation of vulvar itching. She had noted itching the week prior, but this resolved with application of clobetasol which she had for a diagnosis of lichen simplex chronicus. However, patient had also been previously noted by another provider to have a brown vulvar lesions of her right labia minora 12/2021 which was rechecked 03/2022 and noted to be "stable" with plan for continued yearly follow-up.    On 11/12/22, her Ob/Gyn noted a 1cm brown lesion was noted "at the top of her labia minora." Given the appearance of a melanotic lesion, she was recommended to return for a biopsy, which was completed on 11/20/22. Biopsy returned with melanoma in situ, edges involved. No invasive disease was seen.    She underwent a simple partial vulvectomy on 12/25/22 with path results: A. ANTERIOR VULVA, EXCISION: MELANOMA IN SITU, EXTENDING TO THE DEEP SURGICAL MARGIN. THE PERIPHERAL MARGINS ARE NEGATIVE.  B. ANTERIOR VULVA, LEFT OF CLITORAL HOOD, EXCISION:  MELANOMA IN SITU INVOLVING THE 12, 3, 6 AND 9 O'CLOCK MARGINS.    Given multiple positive margins, she returned to the OR on 02/12/23 for additional resection including a WLE of the anterior vulva including removal of remaining bilateral labia minora and removal of clitoris with pathology showing: A.   VULVA, ANTERIOR MARGIN, BIOPSY: NO MELANOMA IN SITU SEEN. B.   VULVA, LEFT ANTERIOR MARGIN, BIOPSY: NO MELANOMA IN SITU SEEN. C.   VULVA, LEFT MEDIAL MARGIN, BIOPSY: NO MELANOMA IN SITU SEEN. D.   **PRELIMINARY DIAGNOSIS** VULVA, ANTERIOR WITH LEFT/RIGHT LABIA MINORA, EXCISION: -RESIDUAL MELANOMA IN SITU , MULTFOCAL RESIDUAL FOCI , INVOLVING BLACK/DEEP MARGIN (D2), -BACKGROUND ATYPICAL JUNCTIONAL MELANOCYTIC HYPERPLASIA INVOLVING MULTIFOCAL MARGINS  (ANTERIOR, OUTER LEFT, INNER LEFT MARGINS INVOLVED WITH ATYPICAL JUNCTIONAL MELANOCYTIC HYPERPLASIA, SEE COMMENT) E.   VULVA, LEFT LATERAL MARGIN, EXCISION: ATYPICAL JUNCTIONAL MELANOCYTIC HYPERPLASIA, NO MELANOMA IN SITU   Scouting biopsies on 06/03/23: negative Scouting biopsies 10/14/254: lichenoid dermatitis   Diagnostic biopsies 12/23/23: melanoma in situ (left anterior) and atypical junctional melanocytic proliferation (right anterior)  On 02/11/2024, she underwent simple partial vulvectomy with Dr. 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 Final pathology returned with: A. VULVA, ANTERIOR, VULVECTOMY:  -  Skin with scar, no residual melanoma in situ identified (Melan-A     staining performed on all sections).   Interval: She contacted the office on Friday afternoon with complaints of dark red spotting from the vulvar incision. No fever or chills reported. She reports doing moderate activity on Friday with getting in and out of the car around 6 times. Reporting having moderate pain Friday evening with need for use of tramadol and ice to the vulva. She had spotting over the weekend and states it is lighter today. Emptying her bladder without difficulty. Having normal bowel movements and using laxatives as needed.   Reports being off her aspirin for the past 4 days due to having nose bleeds daily. This has improved with stopping the aspirin and only being on Eliquis. Reports development of facial rash when the pain was worse. Also feels this may be related to using dial soap. She did try a topical hydrocortisone cream which made her symptoms worse. The rash is itchy at times. She is taking zyrtec and using benadryl as well. She reports left lower leg/ankle edema. She has pain more in the am (in  the ankle region, denies calf pain) that improves with walking. She has worn a boot on this leg which made her symptoms worse. Denies ever having a doppler in the left leg. She reports needing to  have another cardioversion.      Exam:  Today's Vitals   02/24/24 1418  BP: 138/63  Pulse: 60  Resp: 16  Temp: 98.9 F (37.2 C)  TempSrc: Oral  SpO2: 100%  Weight: 202 lb (91.6 kg)  Height: 5\' 1"  (1.549 m)  PainSc: 4    Body mass index is 38.17 kg/m.  Alert, oriented, in no acute distress.  Erythematous rash with small amount of tiny papules noted on bilateral buccal regions, no extension to other facial areas. No drainage.  Lungs clear. Heart irregularly irregular, non-rapid. Abdomen soft with active bowel sounds. Vulvar incision is closed proximally with sutures present. On the distal aspect closest to the introitus, there is a 0.25 cm shallow opening in the incision where a suture has released/broke. No drainage, erythema, fluctuance along with entire aspect of the incision.  Lower extremity edema, more in the LLE. Tenderness around the LLE ankle. Edema is non-pitting.    Assessment/Plan: 68 year old female with hx of melanoma in situ of the vulva s/p recent partial vulvectomy with Dr. Alvester Morin on 02/11/2024 presenting to the office for vulvar incision check. No signs of infection on today's exam. Mild incisional separation on exam today. Vulvar care discussed. Given LLE symptoms, plan for doppler to rule out DVT. Reportable signs and symptoms reviewed. She is advised to call for any needs, concerns, or new symptoms. She will continue to monitor her facial rash. She has follow up planned for Monday, March 24 or can be seen sooner if needed.

## 2024-02-24 NOTE — Patient Instructions (Addendum)
 No infection seen at the vulvar incision on today's exam. At the lower aspect of the incision closest to the vagina, there is a shallow, small opening where one of the stitches came loose. This will heal from the inside out. Continue use of peri bottle multiple times a day, especially after toileting and pat dry. Monitor for any changes and please call the office with any new symptoms. You can continue to use the frozen peas  Plan on having a doppler to further evaluate the left leg due to pain and swelling to evaluate for a blood clot.   Plan on following up as scheduled or sooner if needed. Please call for any new, persistent symptoms.

## 2024-02-25 ENCOUNTER — Ambulatory Visit: Admitting: Podiatry

## 2024-02-25 ENCOUNTER — Encounter (HOSPITAL_COMMUNITY)

## 2024-02-25 DIAGNOSIS — M7672 Peroneal tendinitis, left leg: Secondary | ICD-10-CM

## 2024-02-25 MED ORDER — METHYLPREDNISOLONE 4 MG PO TBPK
ORAL_TABLET | ORAL | 0 refills | Status: DC
Start: 2024-02-25 — End: 2024-04-02

## 2024-02-25 NOTE — Progress Notes (Signed)
 Subjective:  Patient ID: [name] [name], female    DOB: 01/21/1956,  MRN: [id number]  Chief Complaint  Patient presents with   Foot Pain    68 y.o. female presents with the above complaint.  Patient presents for follow-up of left peroneal tendinitis she states the injection did help for a little while but she started noticing pain again would like to discuss Nexplanon implant   Review of Systems: Negative except as noted in the HPI. Denies N/V/F/Ch.  Past Medical History:  Diagnosis Date   Actinic keratosis 03/29/2021   R central upper lip   Allergic rhinitis    Allergy    Anemia 10/29/2023   Anxiety    Arthritis    Atypical mole 12/26/2021   spinal upper back, exc 01/15/22   Basal cell carcinoma 10/16/2010   R lower pretibia    Bipolar 1 disorder (HCC)    Chronic kidney disease    Chronic kidney disease    Depression    Dyspnea    Fissure, anal    GERD (gastroesophageal reflux disease)    History of basal cell carcinoma excision    ON FACE   History of squamous cell carcinoma excision    ON RIGHT LEG   Hyperlipidemia    Hypothyroidism    IBS (irritable bowel syndrome)    Liver cyst    Malignant melanoma in situ (HCC) 11/20/2022   Right labia minora. Excision 12/25/2022 at anterior vulva, left of clitoral hood, margins involved. Surgery scheduled for 02/12/2023.   Mallet finger of left hand    small finger   NSTEMI (non-ST elevated myocardial infarction) (HCC) 10/22/2023   Pneumonia    Pre-diabetes     Current Outpatient Medications:    methylPREDNISolone (MEDROL DOSEPAK) 4 MG TBPK tablet, Take as directed, Disp: 21 each, Rfl: 0   acetaminophen (TYLENOL) 650 MG CR tablet, Take 1,300 mg by mouth every 8 (eight) hours as needed for pain., Disp: , Rfl:    apixaban (ELIQUIS) 5 MG TABS tablet, Take 5 mg by mouth 2 (two) times daily., Disp: , Rfl:    ARIPiprazole (ABILIFY) 10 MG tablet, Take 10 mg by mouth at bedtime., Disp: , Rfl:    aspirin EC 81 MG tablet, Take 1  tablet (81 mg total) by mouth daily. Swallow whole., Disp: 30 tablet, Rfl: 0   Biotin 5000 MCG CAPS, Take 5,000 mcg by mouth daily., Disp: , Rfl:    buPROPion (WELLBUTRIN XL) 300 MG 24 hr tablet, Take 300 mg by mouth daily., Disp: , Rfl:    cetirizine (ZYRTEC ALLERGY) 10 MG tablet, Take 10 mg by mouth daily., Disp: , Rfl:    cholecalciferol (VITAMIN D3) 25 MCG (1000 UNIT) tablet, Take 1,000 Units by mouth daily., Disp: , Rfl:    clonazePAM (KLONOPIN) 1 MG tablet, Take 1 mg by mouth at bedtime., Disp: , Rfl:    Coenzyme Q10 (COQ10) 100 MG CAPS, Take 100 mg by mouth at bedtime., Disp: , Rfl:    famotidine (PEPCID) 20 MG tablet, TAKE 1 TABLET TWICE DAILY, Disp: 180 tablet, Rfl: 1   fluticasone (FLONASE) 50 MCG/ACT nasal spray, Place 2 sprays into both nostrils daily., Disp: , Rfl:    hydrOXYzine (ATARAX) 25 MG tablet, Take 1 tablet (25 mg total) by mouth every 6 (six) hours., Disp: 12 tablet, Rfl: 0   lamoTRIgine (LAMICTAL) 200 MG tablet, Take 200 mg by mouth at bedtime., Disp: , Rfl:    levothyroxine (SYNTHROID) 50 MCG tablet, TAKE 1 TABLET  EVERY DAY BEFORE BREAKFAST, Disp: 90 tablet, Rfl: 1   lisinopril (ZESTRIL) 2.5 MG tablet, Take 1 tablet (2.5 mg total) by mouth daily., Disp: 30 tablet, Rfl: 0   lovastatin (MEVACOR) 40 MG tablet, TAKE 1 TABLET EVERY DAY, Disp: 90 tablet, Rfl: 1   nystatin (MYCOSTATIN/NYSTOP) powder, Apply 1 Application topically 3 (three) times daily. Under abdomen skin fold, Disp: 15 g, Rfl: 2   predniSONE (STERAPRED UNI-PAK 21 TAB) 10 MG (21) TBPK tablet, Take by mouth daily. Take 6 tabs by mouth daily  for 1 days, then 5 tabs for 1 days, then 4 tabs for 1 days, then 3 tabs for 1 days, 2 tabs for 1 days, then 1 tab by mouth daily for 1 days, Disp: 21 tablet, Rfl: 0   torsemide (DEMADEX) 20 MG tablet, Take 20 mg by mouth daily., Disp: , Rfl:    zolpidem (AMBIEN) 10 MG tablet, Take 10 mg by mouth at bedtime., Disp: , Rfl:   Social History   Tobacco Use  Smoking Status Never   Smokeless Tobacco Never    Allergies  Allergen Reactions   Ingrezza [Valbenazine Tosylate] Other (See Comments)    Night sweats, worsened tardive dyskinesia, nightmares   Lidocaine Swelling    ALL CAINE DRUGS EXCEPT: marcaine & sensercaine    Xylocaine [Lidocaine Hcl] Swelling    ALLERGIC TO ALL CAINES EXCEPT SENSORCAINE , AND MARCAINE   Albuterol Other (See Comments)    Leg swelling   Amantadine     Muscle pain and tremors    Austedo Xr [Deutetrabenazine]     Caused Muscle aches and tremors   Codeine Nausea And Vomiting    Vomits blood   Lithium Swelling   Singulair [Montelukast] Swelling    Leg swelling   Tegretol [Carbamazepine] Other (See Comments)    skin crawls   Objective:  There were no vitals filed for this visit. There is no height or weight on file to calculate BMI. Constitutional Well developed. Well nourished.  Vascular Dorsalis pedis pulses palpable bilaterally. Posterior tibial pulses palpable bilaterally. Capillary refill normal to all digits.  No cyanosis or clubbing noted. Pedal hair growth normal.  Neurologic Normal speech. Oriented to person, place, and time. Epicritic sensation to light touch grossly present bilaterally.  Dermatologic Nails well groomed and normal in appearance. No open wounds. No skin lesions.  Orthopedic: Pain on palpation left lateral foot pain with resisted dorsiflexion eversion of the foot no pain with plantarflexion inversion of the foot.  Pain along the course of the peroneal tendon no pain at the insertion mild pain at the ATFL ligament.   Radiographs: None Assessment:   1. Peroneal tendinitis of left lower extremity     Plan:  Patient was evaluated and treated and all questions answered.  Left peroneal tendinitis with underlying painful pes planovalgus deformity -All questions and concerns were discussed with the patient extensive detail given clinically steroid injection did help however she still continues to  have pain.  She would like to discuss MRI option denies any other acute complaints -MRI was ordered to rule out tearing of the peroneal tendon -Sugar modification was discussed with over-the-counter counter insoles discussed -She will also benefit from Tri-Lock ankle brace to help transition out of the boot Tri-Lock ankle brace was dispensed  No follow-ups on file.

## 2024-02-26 ENCOUNTER — Ambulatory Visit: Payer: Self-pay | Admitting: Family Medicine

## 2024-02-26 ENCOUNTER — Other Ambulatory Visit: Payer: Self-pay

## 2024-02-26 ENCOUNTER — Encounter: Payer: Self-pay | Admitting: Emergency Medicine

## 2024-02-26 ENCOUNTER — Ambulatory Visit
Admission: EM | Admit: 2024-02-26 | Discharge: 2024-02-26 | Disposition: A | Discharge status: Home / Self Care | Attending: Emergency Medicine | Admitting: Emergency Medicine

## 2024-02-26 DIAGNOSIS — R21 Rash and other nonspecific skin eruption: Secondary | ICD-10-CM

## 2024-02-26 MED ORDER — HYDROXYZINE HCL 25 MG PO TABS: 25.0000 mg | ORAL_TABLET | Freq: Four times a day (QID) | ORAL | 0 refills | Status: DC

## 2024-02-26 MED ORDER — METHYLPREDNISOLONE SODIUM SUCC 125 MG IJ SOLR
60.0000 mg | Freq: Once | INTRAMUSCULAR | Status: AC
  Administered 2024-02-26: 60 mg via INTRAMUSCULAR

## 2024-02-26 MED ORDER — PREDNISONE 10 MG (21) PO TBPK: ORAL_TABLET | Freq: Every day | ORAL | 0 refills | Status: DC

## 2024-02-26 NOTE — Telephone Encounter (Signed)
 Chief Complaint: swelling, rash Symptoms: bilateral cheek and hand swelling, bilateral cheek rash, rash to hand,  Frequency: about 4 days Pertinent Negatives: Patient denies fever, SOB, difficulty swallowing, drainage from rash,  Disposition: [] ED /[x] Urgent Care (no appt availability in office) / [] Appointment(In office/virtual)/ []  [facility]/ [] Home Care/ [] Refused Recommended Disposition /[] [facility]/ []  Follow-up with PCP Additional Notes: Pt states that she had surgery about 2 weeks ago and has been taking tylenol for the pain. Pt states that about 4 days ago she developed cheek swelling and a rash to both cheeks. Pt states the rash is red, no drainage and each spot is about the size of the pencil tip. Pt states that she has never had this before. Pt states that she started taking benadryl, pt states that the swelling and rash have not improved nor have they started to resolve. No appts today, advised UC.  Copied from CRM [id number]. Topic: Clinical - Red Word Triage >> Feb 26, 2024 10:22 AM Denese Killings wrote: Red Word that prompted transfer to Nurse Triage: Patient has a rash on both sides of face, jaws are swollen,  and spread to her hands. She feels that she may be having a allergic reaction. Reason for Disposition  Face becomes swollen  Answer Assessment - Initial Assessment Questions 1. APPEARANCE of RASH: "Describe the rash." (e.g., spots, blisters, raised areas, skin peeling, scaly)     Red and bumpy 2. SIZE: "How big are the spots?" (e.g., tip of pen, eraser, coin; inches, centimeters)     Less than a pencil size, like qtip size 3. LOCATION: "Where is the rash located?"     Bilateral cheeks and hand 4. COLOR: "What color is the rash?" (Note: It is difficult to assess rash color in people with darker-colored skin. When this situation occurs, simply ask the caller to describe what they see.)     red 5. ONSET: "When did the rash begin?"     About 4 days 6.  FEVER: "Do you have a fever?" If Yes, ask: "What is your temperature, how was it measured, and when did it start?"     denies 7. ITCHING: "Does the rash itch?" If Yes, ask: "How bad is the itch?" (Scale 1-10; or mild, moderate, severe)     mild 8. CAUSE: "What do you think is causing the rash?"     unsure 9. MEDICINE FACTORS: "Have you started any new medicines within the last 2 weeks?" (e.g., antibiotics)      denies 10. OTHER SYMPTOMS: "Do you have any other symptoms?" (e.g., dizziness, headache, sore throat, joint pain)       HA once through this whole process  Answer Assessment - Initial Assessment Questions 1. ONSET: "When did the swelling start?" (e.g., minutes, hours, days)     About 3 days 2. LOCATION: "What part of the face is swollen?"     Cheeks bilaterally, rash on cheek to forehead 3. SEVERITY: "How swollen is it?"     moderate 4. ITCHING: "Is there any itching?" If Yes, ask: "How much?"   (Scale 1-10; mild, moderate or severe)     mild 5. PAIN: "Is the swelling painful to touch?" If Yes, ask: "How painful is it?"   (Scale 1-10; mild, moderate or severe)   - NONE (0): no pain   - MILD (1-3): doesn't interfere with normal activities    - MODERATE (4-7): interferes with normal activities or awakens from sleep    - SEVERE (  8-10): excruciating pain, unable to do any normal activities      0-1 6. FEVER: "Do you have a fever?" If Yes, ask: "What is it, how was it measured, and when did it start?"      denies 7. CAUSE: "What do you think is causing the face swelling?"     Unsure, Tylenol bottle says that it can cause swelling, I have been taking tylenol every 4-6 hours 8. RECURRENT SYMPTOM: "Have you had face swelling before?" If Yes, ask: "When was the last time?" "What happened that time?"     denies 9. OTHER SYMPTOMS: "Do you have any other symptoms?" (e.g., toothache, leg swelling)     denies  Protocols used: Face Swelling-A-AH, Rash or Redness - [facility]

## 2024-02-26 NOTE — ED Provider Notes (Signed)
 Renaldo Fiddler    CSN: [id number] Arrival date & time: 02/26/24  1133      History   Chief Complaint Chief Complaint  Patient presents with   Rash    HPI [name] [name] is a 68 y.o. female.   Patient presents for evaluation of erythematous.  Rash present to the bilateral cheeks with bilateral facial swelling present for 6 days.  Has worsened and has begun to spread to the bilateral upper extremities.  Has attempted use of hydrocortisone and Benadryl which has been ineffective.  Denies new medications, toiletries, recent travel.  No sick contact.   Past Medical History:  Diagnosis Date   Actinic keratosis 03/29/2021   R central upper lip   Allergic rhinitis    Allergy    Anemia 10/29/2023   Anxiety    Arthritis    Atypical mole 12/26/2021   spinal upper back, exc 01/15/22   Basal cell carcinoma 10/16/2010   R lower pretibia    Bipolar 1 disorder (HCC)    Chronic kidney disease    Chronic kidney disease    Depression    Dyspnea    Fissure, anal    GERD (gastroesophageal reflux disease)    History of basal cell carcinoma excision    ON FACE   History of squamous cell carcinoma excision    ON RIGHT LEG   Hyperlipidemia    Hypothyroidism    IBS (irritable bowel syndrome)    Liver cyst    Malignant melanoma in situ (HCC) 11/20/2022   Right labia minora. Excision 12/25/2022 at anterior vulva, left of clitoral hood, margins involved. Surgery scheduled for 02/12/2023.   Mallet finger of left hand    small finger   NSTEMI (non-ST elevated myocardial infarction) (HCC) 10/22/2023   Pneumonia    Pre-diabetes     Patient Active Problem List   Diagnosis Date Noted   Anemia 10/29/2023   Chest pain 10/22/2023   NSTEMI (non-ST elevated myocardial infarction) (HCC) 10/22/2023   Pain of left hip 09/11/2023   History of anal fissures 09/09/2023   Acute left ankle pain 08/09/2023   Osteopenia 07/30/2023   Headache 07/05/2023   Paroxysmal atrial fibrillation (HCC)  06/27/2023   Bradycardia 05/30/2023   Melanoma in situ (HCC) 12/25/2022   ASCUS of cervix with negative high risk HPV 06/25/2022   Osteoarthritis of knee 05/25/2022   Hemorrhoid 04/25/2022   Estrogen deficiency 01/02/2022   Bipolar disorder, in partial remission, most recent episode mixed (HCC) 02/25/2020   Dyspnea 01/19/2020   Prediabetes 03/03/2018   Atrophic vaginitis 07/08/2017   Cervical stenosis (uterine cervix) 01/03/2017   Hearing loss 10/25/2015   Routine general medical examination at a health care facility 10/17/2015   Decreased GFR 10/22/2014   Colon cancer screening 10/16/2013   Enthesopathy of ankle and tarsus 12/01/2009   Enthesopathy of ankle and tarsus 12/01/2009   Asymptomatic postmenopausal status 09/30/2009   Arthropathy of pelvic region and thigh 03/10/2009   DEGENERATIVE DISC DISEASE, LUMBAR SPINE 03/10/2009   DDD (degenerative disc disease), lumbosacral 03/10/2009   ONYCHOMYCOSIS 09/17/2008   Hypothyroidism 08/05/2007   Morbid obesity (HCC) 08/05/2007   Bipolar disorder (HCC) 08/05/2007   Allergic rhinitis 08/05/2007   IBS 08/05/2007   GERD (gastroesophageal reflux disease) 08/05/2007   HYPERCHOLESTEROLEMIA, PURE 07/07/2007    Past Surgical History:  Procedure Laterality Date   ABLATION     CARDIAC CATHETERIZATION  04/10/1999   per pt normal   CARDIOVASCULAR STRESS TEST  02-23-2011  dr  kenneth fath [facility] clinic)   normal nuclear study/  no ischemia/  ef 64%   CARDIOVERSION N/A 01/10/2024   Procedure: CARDIOVERSION;  Surgeon: Tiajuana Amass, MD;  Location: [facility];  Service: Cardiovascular;  Laterality: N/A;   CATARACT EXTRACTION W/ INTRAOCULAR LENS  IMPLANT, BILATERAL  12/10/2001   Bil   CESAREAN SECTION  12/10/1982   CLOSED REDUCTION FINGER WITH PERCUTANEOUS PINNING Left 04/22/2014   Procedure: LEFT SMALL FINGER CLOSED REDUCTION PINNING;  Surgeon: Sharma Covert, MD;  Location: [facility] [place];  Service: Orthopedics;  Laterality:  Left;   COLONOSCOPY  11/09/2002   DILATION AND CURETTAGE OF UTERUS  1977   LAPAROSCOPIC CHOLECYSTECTOMY  03/18/2000   RECONSTRUCTION OF EYELID Bilateral 08/2022   RIGHT/LEFT HEART CATH AND CORONARY ANGIOGRAPHY Bilateral 11/25/2023   Procedure: RIGHT/LEFT HEART CATH AND CORONARY ANGIOGRAPHY;  Surgeon: Alwyn Pea, MD;  Location: [facility];  Service: Cardiovascular;  Laterality: Bilateral;   TEE WITHOUT CARDIOVERSION N/A 01/10/2024   Procedure: TRANSESOPHAGEAL ECHOCARDIOGRAM (TEE);  Surgeon: Tiajuana Amass, MD;  Location: [facility];  Service: Cardiovascular;  Laterality: N/A;   TRANSTHORACIC ECHOCARDIOGRAM  02/23/2011   normal lvf/  ef 58%/  mild rve/   mild lae/  mild  mr  & tr/  mild pulmonary htn   VULVECTOMY N/A 12/25/2022   Procedure: PARTIAL  VULVECTOMY;  Surgeon: [name], MD;  Location: [facility];  Service: Gynecology;  Laterality: N/A;   VULVECTOMY N/A 02/12/2023   Procedure: WIDE LOCAL EXCISION OF VULVA AND REMOVAL OF CLITORIS;  Surgeon: [name], MD;  Location: [facility];  Service: Gynecology;  Laterality: N/A;   VULVECTOMY N/A 02/11/2024   Procedure: WIDE EXCISION VULVECTOMY;  Surgeon: [name], MD;  Location: [facility];  Service: Gynecology;  Laterality: N/A;   WISDOM TOOTH EXTRACTION      OB History     Gravida  1   Para  1   Term  1   Preterm      AB      Living  1      SAB      IAB      Ectopic      Multiple      Live Births  1            Home Medications    Prior to Admission medications   Medication Sig Start Date End Date Taking? Authorizing Provider  hydrOXYzine (ATARAX) 25 MG tablet Take 1 tablet (25 mg total) by mouth every 6 (six) hours. 02/26/24  Yes Vinayak Bobier R, NP  predniSONE (STERAPRED UNI-PAK 21 TAB) 10 MG (21) TBPK tablet Take by mouth daily. Take 6 tabs by mouth daily  for 1 days, then 5 tabs for 1 days, then 4 tabs for 1 days, then 3 tabs for 1 days, 2 tabs  for 1 days, then 1 tab by mouth daily for 1 days 02/26/24  Yes Monie Shere R, NP  acetaminophen (TYLENOL) 650 MG CR tablet Take 1,300 mg by mouth every 8 (eight) hours as needed for pain.    [provider]  apixaban (ELIQUIS) 5 MG TABS tablet Take 5 mg by mouth 2 (two) times daily.    [provider]  ARIPiprazole (ABILIFY) 10 MG tablet Take 10 mg by mouth at bedtime. 07/11/23   [provider]  aspirin EC 81 MG tablet Take 1 tablet (81 mg total) by mouth daily. Swallow whole. 10/25/23   Sunnie Nielsen, DO  Biotin 5000 MCG CAPS Take 5,000 mcg by mouth daily.    [provider]  buPROPion (WELLBUTRIN XL) 300 MG 24 hr tablet Take 300 mg by mouth daily. 04/08/23   [provider]  cetirizine (ZYRTEC ALLERGY) 10 MG tablet Take 10 mg by mouth daily.    [provider]  cholecalciferol (VITAMIN D3) 25 MCG (1000 UNIT) tablet Take 1,000 Units by mouth daily.    [provider]  clonazePAM (KLONOPIN) 1 MG tablet Take 1 mg by mouth at bedtime.    [provider]  Coenzyme Q10 (COQ10) 100 MG CAPS Take 100 mg by mouth at bedtime.    [provider]  famotidine (PEPCID) 20 MG tablet TAKE 1 TABLET TWICE DAILY 02/17/24   Tower, Audrie Gallus, MD  fluticasone (FLONASE) 50 MCG/ACT nasal spray Place 2 sprays into both nostrils daily.    [provider]  lamoTRIgine (LAMICTAL) 200 MG tablet Take 200 mg by mouth at bedtime. 11/06/23   [provider]  levothyroxine (SYNTHROID) 50 MCG tablet TAKE 1 TABLET EVERY DAY BEFORE BREAKFAST 01/22/24   Tower, [place] A, MD  lisinopril (ZESTRIL) 2.5 MG tablet Take 1 tablet (2.5 mg total) by mouth daily. 10/25/23   Sunnie Nielsen, DO  lovastatin (MEVACOR) 40 MG tablet TAKE 1 TABLET EVERY DAY 01/22/24   Tower, Audrie Gallus, MD  methylPREDNISolone (MEDROL DOSEPAK) 4 MG TBPK tablet Take as directed 02/25/24   Candelaria Stagers, DPM  nystatin (MYCOSTATIN/NYSTOP) powder Apply 1 Application topically  3 (three) times daily. Under abdomen skin fold 12/17/22   Cross, Efraim Kaufmann D, NP  torsemide (DEMADEX) 20 MG tablet Take 20 mg by mouth daily. 11/15/23   [provider]  zolpidem (AMBIEN) 10 MG tablet Take 10 mg by mouth at bedtime.    [provider]    Family History Family History  Problem Relation Age of Onset   Stroke Mother    Heart disease Mother    Hypertension Father    Diabetes Father    Hyperlipidemia Father    Breast cancer Sister    Breast cancer Sister    Diabetes Brother    Colon cancer Neg Hx    Ovarian cancer Neg Hx    Endometrial cancer Neg Hx    Pancreatic cancer Neg Hx    Prostate cancer Neg Hx     Social History Social History   Tobacco Use   Smoking status: Never   Smokeless tobacco: Never  Vaping Use   Vaping status: Never Used  Substance Use Topics   Alcohol use: Yes    Comment: Only on cruises or rarely   Drug use: No     Allergies   Ingrezza [valbenazine tosylate], Lidocaine, Xylocaine [lidocaine hcl], Albuterol, Amantadine, [place] xr [deutetrabenazine], Codeine, Lithium, Singulair [montelukast], and Tegretol [carbamazepine]   Review of Systems Review of Systems  Skin:  Positive for rash.     Physical Exam Triage Vital Signs ED Triage Vitals [02/26/24 1137]  Encounter Vitals Group     BP      Systolic BP Percentile      Diastolic BP Percentile      Pulse      Resp      Temp      Temp src      SpO2      Weight      Height      Head Circumference      Peak Flow      Pain Score 0  Pain Loc      Pain Education      Exclude from Growth Chart    No data found.  Updated Vital Signs There were no vitals taken for this visit.  Visual Acuity Right Eye Distance:   Left Eye Distance:   Bilateral Distance:    Right Eye Near:   Left Eye Near:    Bilateral Near:     Physical Exam Constitutional:      Appearance: Normal appearance.  HENT:     Head: Normocephalic.     Comments: Erythematous fine papular  rash present to the bilateral cheeks, no swelling noted Skin:    Comments: Erythematous papular rash present to the dorsum aspect of the left and right wrist  Neurological:     Mental Status: She is alert.      UC Treatments / Results  Labs (all labs ordered are listed, but only abnormal results are displayed) Labs Reviewed - No data to display  EKG   Radiology VAS [place] LOWER EXTREMITY VENOUS (DVT) Result Date: 02/24/2024  Lower Venous DVT Study Patient Name:  RUPAL CHILDRESS Armas  Date of Exam:   02/24/2024 Medical Rec #: [id number]        Accession #:    [id number] Date of Birth: [birth year]        Patient Gender: F Patient Age:   23 years Exam Location:  [facility] Procedure:      VAS [place] LOWER EXTREMITY VENOUS (DVT) Referring Phys: MELISSA CROSS --------------------------------------------------------------------------------  Indications: Left ankle pain & swelling.  Risk Factors: Surgery Partial vulvectomy for melanoma on 02/11/2024. Comparison Study: No previous exams Performing Technologist: Jody Hill RVT, RDMS  Examination Guidelines: A complete evaluation includes B-mode imaging, spectral Doppler, color Doppler, and power Doppler as needed of all accessible portions of each vessel. Bilateral testing is considered an integral part of a complete examination. Limited examinations for reoccurring indications may be performed as noted. The reflux portion of the exam is performed with the patient in reverse Trendelenburg.  +-----+---------------+---------+-----------+----------+--------------+ RIGHTCompressibilityPhasicitySpontaneityPropertiesThrombus Aging +-----+---------------+---------+-----------+----------+--------------+ CFV  Full           Yes      Yes                                 +-----+---------------+---------+-----------+----------+--------------+   +---------+---------------+---------+-----------+----------+--------------+ LEFT      CompressibilityPhasicitySpontaneityPropertiesThrombus Aging +---------+---------------+---------+-----------+----------+--------------+ CFV      Full           Yes      Yes                                 +---------+---------------+---------+-----------+----------+--------------+ SFJ      Full                                                        +---------+---------------+---------+-----------+----------+--------------+ FV Prox  Full           Yes      Yes                                 +---------+---------------+---------+-----------+----------+--------------+ FV Mid   Full  Yes      Yes                                 +---------+---------------+---------+-----------+----------+--------------+ FV DistalFull           Yes      Yes                                 +---------+---------------+---------+-----------+----------+--------------+ PFV      Full                                                        +---------+---------------+---------+-----------+----------+--------------+ POP      Full           Yes      Yes                                 +---------+---------------+---------+-----------+----------+--------------+ PTV      Full                                                        +---------+---------------+---------+-----------+----------+--------------+ PERO     Full                                                        +---------+---------------+---------+-----------+----------+--------------+     Summary: RIGHT: - No evidence of common femoral vein obstruction.   LEFT: - There is no evidence of deep vein thrombosis in the lower extremity.  - No cystic structure found in the popliteal fossa. Subcutaneous edema seen in ankle.  *See table(s) above for measurements and observations. Electronically signed by Gerarda Fraction on 02/24/2024 at 5:31:19 PM.    Final     Procedures Procedures (including critical care  time)  Medications Ordered in UC Medications  methylPREDNISolone sodium succinate (SOLU-MEDROL) 125 mg/2 mL injection 60 mg (has no administration in time range)    Initial Impression / Assessment and Plan / UC Course  I have reviewed the triage vital signs and the nursing notes.  Pertinent labs & imaging results that were available during my care of the patient were reviewed by me and considered in my medical decision making (see chart for details).  Rash  Appears inflammatory, no signs of infection, discussed low suspicion for medication reaction as no new medication introduced recently, methylprednisolone injection given and prescribed prednisone and hydroxyzine for outpatient use, recommended additional supportive care with follow-up as needed Final Clinical Impressions(s) / UC Diagnoses   Final diagnoses:  Rash and nonspecific skin eruption     Discharge Instructions      Today you have been evaluated for your rash which appears to be a reaction to something, will not be able to provide you with what at this time however we can treat  Presentation is not consistent with a bacterial rash  as it is spreading across the body and very itchy  You have been given an injection of steroids to help stop the inflammatory response and ideally will start to see some improvement within 30 minutes to an hour  Starting tomorrow take prednisone every morning with food for 6 days to continue the process above  You may use hydroxyzine every 6 hours as needed for severe itching, may also use topical Benadryl cream or calamine lotion  Avoid long exposure to heat as this will cause irritation to the skin  May follow-up for any further concerns   ED Prescriptions     Medication Sig Dispense Auth. Provider   predniSONE (STERAPRED UNI-PAK 21 TAB) 10 MG (21) TBPK tablet Take by mouth daily. Take 6 tabs by mouth daily  for 1 days, then 5 tabs for 1 days, then 4 tabs for 1 days, then 3 tabs for 1  days, 2 tabs for 1 days, then 1 tab by mouth daily for 1 days 21 tablet Shiniqua Groseclose R, NP   hydrOXYzine (ATARAX) 25 MG tablet Take 1 tablet (25 mg total) by mouth every 6 (six) hours. 12 tablet Farrell Pantaleo, Elita Boone, NP      PDMP not reviewed this encounter.   Valinda Hoar, NP 02/26/24 1158

## 2024-02-26 NOTE — ED Triage Notes (Addendum)
 Patient presents to [facility] for evaluation of rash developing on right cheek, spreading to left, then to left hand.  Has been taking Tylenol since 3/4 after having a vulvar melanoma removed.  Patient states her NP looked at it when she went in for a checkup and was told to [place] hydrocortisone but had already tried and it made it worse. Has been taking Benadryl since Saturday or Sunday without much relief.  Rash started Friday.  Patient did take a Tramadol on Thursday due to bladder pain due to not being able to urinate.

## 2024-02-26 NOTE — Discharge Instructions (Addendum)
 Today you have been evaluated for your rash which appears to be a reaction to something, will not be able to provide you with what at this time however we can treat  Presentation is not consistent with a bacterial rash as it is spreading across the body and very itchy  You have been given an injection of steroids to help stop the inflammatory response and ideally will start to see some improvement within 30 minutes to an hour  Starting tomorrow take prednisone every morning with food for 6 days to continue the process above  You may use hydroxyzine every 6 hours as needed for severe itching, may also use topical Benadryl cream or calamine lotion  Avoid long exposure to heat as this will cause irritation to the skin  May follow-up for any further concerns

## 2024-02-26 NOTE — Telephone Encounter (Signed)
 Agree with that advisement to be seen at [facility] today Aware, will watch for correspondence

## 2024-03-01 ENCOUNTER — Ambulatory Visit
Admission: RE | Admit: 2024-03-01 | Discharge: 2024-03-01 | Disposition: A | Discharge status: Home / Self Care | Source: Ambulatory Visit | Attending: Podiatry

## 2024-03-01 DIAGNOSIS — M7662 Achilles tendinitis, left leg: Secondary | ICD-10-CM | POA: Diagnosis not present

## 2024-03-01 DIAGNOSIS — M65872 Other synovitis and tenosynovitis, left ankle and foot: Secondary | ICD-10-CM | POA: Diagnosis not present

## 2024-03-01 DIAGNOSIS — M7672 Peroneal tendinitis, left leg: Secondary | ICD-10-CM

## 2024-03-01 DIAGNOSIS — M19072 Primary osteoarthritis, left ankle and foot: Secondary | ICD-10-CM | POA: Diagnosis not present

## 2024-03-01 DIAGNOSIS — M76822 Posterior tibial tendinitis, left leg: Secondary | ICD-10-CM | POA: Diagnosis not present

## 2024-03-02 ENCOUNTER — Inpatient Hospital Stay (HOSPITAL_BASED_OUTPATIENT_CLINIC_OR_DEPARTMENT_OTHER): Payer: Medicare HMO | Admitting: Psychiatry

## 2024-03-02 ENCOUNTER — Encounter: Payer: Self-pay | Admitting: Psychiatry

## 2024-03-02 VITALS — BP 131/59 | HR 56 | Temp 99.0°F | Resp 20 | Wt 197.4 lb

## 2024-03-02 DIAGNOSIS — J4520 Mild intermittent asthma, uncomplicated: Secondary | ICD-10-CM | POA: Diagnosis not present

## 2024-03-02 DIAGNOSIS — E66813 Obesity, class 3: Secondary | ICD-10-CM | POA: Diagnosis not present

## 2024-03-02 DIAGNOSIS — C519 Malignant neoplasm of vulva, unspecified: Secondary | ICD-10-CM

## 2024-03-02 DIAGNOSIS — G4733 Obstructive sleep apnea (adult) (pediatric): Secondary | ICD-10-CM | POA: Diagnosis not present

## 2024-03-02 DIAGNOSIS — Z6841 Body Mass Index (BMI) 40.0 and over, adult: Secondary | ICD-10-CM | POA: Diagnosis not present

## 2024-03-02 DIAGNOSIS — J301 Allergic rhinitis due to pollen: Secondary | ICD-10-CM | POA: Diagnosis not present

## 2024-03-02 DIAGNOSIS — Z9889 Other specified postprocedural states: Secondary | ICD-10-CM | POA: Insufficient documentation

## 2024-03-02 DIAGNOSIS — R0602 Shortness of breath: Secondary | ICD-10-CM | POA: Diagnosis not present

## 2024-03-02 DIAGNOSIS — Z7189 Other specified counseling: Secondary | ICD-10-CM

## 2024-03-02 NOTE — Patient Instructions (Addendum)
 It was a pleasure to see you in clinic today. - Healing well - okay to travel, okay to resume exercise class in 2 weeks - Return visit planned for 31mo  Thank you very much for allowing me to provide care for you today.  I appreciate your confidence in choosing our Gynecologic Oncology team at [facility].  If you have any questions about your visit today please call our office or send [place] a MyChart message and we will get back to you as soon as possible.

## 2024-03-02 NOTE — Progress Notes (Signed)
 Gynecologic Oncology Return Clinic Visit  Date of Service: 03/02/2024 Referring Provider: Nicholaus Bloom, MD   Assessment & Plan: [name] [name] is a 68 y.o. woman with recurrent vulvar melanoma in situ who is s/p repeat wide local excision of the vulva on 02/11/24 (previously with excisions on 12/25/22 and 02/12/23). No residual disease on excision.  Postop: - Pt recovering well from surgery and healing appropriately postoperatively - Intraoperative findings and pathology results reviewed. - Ongoing postoperative expectations and precautions reviewed.  Melanoma in situ: - From original excisions in 2024, focal positive deep margin (one focal spot on 1 single slide). All other margins negative for MIS but positive for atypical lentiginous melanocytic hyperplasia (possible precursor lesion). - No residual melanoma in situ on excision from 02/11/24.  - Continue surveillance with pelvic exam q64mo with scouting biopsies/diagnostic biopsies as indicated. If skin changes, positive biopsies, could consider treatment with aldara. Pt would like to avoid for now (did discuss consideration of proactive treatment). - Once 1 year NED, may be able to reduce follow-up to q78mo.  RTC 53mo.  [name], MD Gynecologic Oncology   Medical Decision Making I personally spent  TOTAL 15 minutes face-to-face and non-face-to-face in the care of this patient, which includes all pre, intra, and post visit time on the date of service. The discussion of vulvar melanoma is beyond the scope of routine postoperative care.   ----------------------- Reason for Visit: Postop/Treatment counseling  Treatment History: Pt presented to her Ob/Gyn for evaluation of vulvar itching. She had noted itching the week prior, but this resolved with application of clobetasol which she had for a diagnosis of lichen simplex chronicus. However, patient had also been previously noted by another provider to have a brown vulvar lesions of her  right labia minora 12/2021 which was rechecked 03/2022 and noted to be "stable" with plan for continued yearly follow-up.    On 11/12/22, her Ob/Gyn noted a 1cm brown lesion was noted "at the top of her labia minora." Given the appearance of a melanotic lesion, she was recommended to return for a biopsy, which was completed on 11/20/22. Biopsy returned with melanoma in situ, edges involved. No invasive disease was seen.    She underwent a simple partial vulvectomy on 12/25/22 with path results: A. ANTERIOR VULVA, EXCISION: MELANOMA IN SITU, EXTENDING TO THE DEEP  SURGICAL MARGIN. THE PERIPHERAL MARGINS ARE NEGATIVE.  B. ANTERIOR VULVA, LEFT OF CLITORAL HOOD, EXCISION:  MELANOMA IN SITU  INVOLVING THE 12, 3, 6 AND 9 O'CLOCK MARGINS.    Given multiple positive margins, she returned to the OR on 02/12/23 for additional resection including a WLE of the anterior vulva including removal of remaining bilateral labia minora and removal of clitoris with pathology showing: A.   VULVA, ANTERIOR MARGIN, BIOPSY: NO MELANOMA IN SITU SEEN. COMMENT: Dr. Maryelizabeth Kaufmann is in agreement. B.   VULVA, LEFT ANTERIOR MARGIN, BIOPSY: NO MELANOMA IN SITU SEEN. COMMENT: Dr. Maryelizabeth Kaufmann is in agreement. C.   VULVA, LEFT MEDIAL MARGIN, BIOPSY: NO MELANOMA IN SITU SEEN. COMMENT: MelanA was used to evaluate this slide. Dr. Maryelizabeth Kaufmann is in agreement D.   **PRELIMINARY DIAGNOSIS** VULVA, ANTERIOR WITH LEFT/RIGHT LABIA MINORA, EXCISION: -RESIDUAL MELANOMA IN SITU , MULTFOCAL RESIDUAL FOCI , INVOLVING BLACK/DEEP MARGIN (D2), -BACKGROUND ATYPICAL JUNCTIONAL MELANOCYTIC HYPERPLASIA INVOLVING MULTIFOCAL MARGINS (ANTERIOR, OUTER LEFT, INNER LEFT MARGINS INVOLVED WITH ATYPICAL JUNCTIONAL MELANOCYTIC HYPERPLASIA, SEE COMMENT) COMMENT: Sections show extensive areas of atypical lentiginous melanocytic hyperplasia involving the margins noted above, which may be a precursor lesion  to the residual melanoma in situ present. The atypical lentiginous  melanocytic hyperplasia does not meet criteria for melanoma in situ. These atypical lentiginous foci are negative with PRAME, and exhibit lesser cytological atypia on MITF staining. MelanA overstains many foci and is not contributory. However, residual melanoma in situ with focal upward (pagetoid) growth, severe atypia of melanocytes, and melanocytic confluence is seen in multifocal areas, but notably ONLY at the margin deep inked black (block D2). The remainder of the margins are free of melanoma in situ.  E.   VULVA, LEFT LATERAL MARGIN, EXCISION: ATYPICAL JUNCTIONAL MELANOCYTIC HYPERPLASIA, NO MELANOMA IN SITU COMMENT: No melanoma in situ is seen. PRAME, MelanA, and MITF were used to evaluate this margin. Dr. Maryelizabeth Kaufmann has reviewed this case and is in agreement with the diagnoses rendered above. In managing this patient with extensive atypical lentiginous melanocytic hyperplasia, which likely represents a confluence of extensive melanotic macules and precursor disease to melanoma in situ, this reference article is provided: Charlcie Cradle. May P. [name], Shitanshu Uppai: Margin status in vulvovaginal melanoma: Management and oncologic outcomes of 50 cases. Gynecol Oncol Rep. 2023 Oct; 49: [id number]    Scouting biopsies on 06/03/23: negative Scouting biopsies 10/14/254: lichenoid dermatitis Biopsies 12/23/23: melanoma in situ (left), atypical junctional melanocytic proliferation (right)   Interval History: Pt reports that she is recovering well from surgery. Did present for evaluation by [name], NP on 02/24/24 for superficial wound separation. Since then she is not needing anything for pain. She is eating and drinking well. She is voiding without issue and having regular bowel movements. Was having constipation but improved with miralax and stool softener. Also notes that she experienced a fascial rash for unclear reasons which was treated with steroids and has now nearly resolved.  Spotting has overall slowed down since her visit last week.    Past Medical/Surgical History: Past Medical History:  Diagnosis Date   Actinic keratosis 03/29/2021   R central upper lip   Allergic rhinitis    Allergy    Anemia 10/29/2023   Anxiety    Arthritis    Atypical mole 12/26/2021   spinal upper back, exc 01/15/22   Basal cell carcinoma 10/16/2010   R lower pretibia    Bipolar 1 disorder (HCC)    Chronic kidney disease    Chronic kidney disease    Depression    Dyspnea    Fissure, anal    GERD (gastroesophageal reflux disease)    History of basal cell carcinoma excision    ON FACE   History of squamous cell carcinoma excision    ON RIGHT LEG   Hyperlipidemia    Hypothyroidism    IBS (irritable bowel syndrome)    Liver cyst    Malignant melanoma in situ (HCC) 11/20/2022   Right labia minora. Excision 12/25/2022 at anterior vulva, left of clitoral hood, margins involved. Surgery scheduled for 02/12/2023.   Mallet finger of left hand    small finger   NSTEMI (non-ST elevated myocardial infarction) (HCC) 10/22/2023   Pneumonia    Pre-diabetes     Past Surgical History:  Procedure Laterality Date   ABLATION     CARDIAC CATHETERIZATION  04/10/1999   per pt normal   CARDIOVASCULAR STRESS TEST  02-23-2011  dr Iantha Fallen [facility])   normal nuclear study/  no ischemia/  ef 64%   CARDIOVERSION N/A 01/10/2024   Procedure: CARDIOVERSION;  Surgeon: Tiajuana Amass, MD;  Location: [facility] ORS;  Service: Cardiovascular;  Laterality: N/A;  CATARACT EXTRACTION W/ INTRAOCULAR LENS  IMPLANT, BILATERAL  12/10/2001   Bil   CESAREAN SECTION  12/10/1982   CLOSED REDUCTION FINGER WITH PERCUTANEOUS PINNING Left 04/22/2014   Procedure: LEFT SMALL FINGER CLOSED REDUCTION PINNING;  Surgeon: Sharma Covert, MD;  Location: [facility] [place];  Service: Orthopedics;  Laterality: Left;   COLONOSCOPY  11/09/2002   DILATION AND CURETTAGE OF UTERUS  1977   LAPAROSCOPIC  CHOLECYSTECTOMY  03/18/2000   RECONSTRUCTION OF EYELID Bilateral 08/2022   RIGHT/LEFT HEART CATH AND CORONARY ANGIOGRAPHY Bilateral 11/25/2023   Procedure: RIGHT/LEFT HEART CATH AND CORONARY ANGIOGRAPHY;  Surgeon: Alwyn Pea, MD;  Location: [facility] IN[facility]IVE [facility];  Service: Cardiovascular;  Laterality: Bilateral;   TEE WITHOUT CARDIOVERSION N/A 01/10/2024   Procedure: TRANSESOPHAGEAL ECHOCARDIOGRAM (TEE);  Surgeon: Tiajuana Amass, MD;  Location: [facility] OR[facility];  Service: Cardiovascular;  Laterality: N/A;   TRANSTHORACIC ECHOCARDIOGRAM  02/23/2011   normal lvf/  ef 58%/  mild rve/   mild lae/  mild  mr  & tr/  mild pulmonary htn   VULVECTOMY N/A 12/25/2022   Procedure: PARTIAL  VULVECTOMY;  Surgeon: [name], MD;  Location: [facility];  Service: Gynecology;  Laterality: N/A;   VULVECTOMY N/A 02/12/2023   Procedure: WIDE LOCAL EXCISION OF VULVA AND REMOVAL OF CLITORIS;  Surgeon: [name], MD;  Location: [facility] [place];  Service: Gynecology;  Laterality: N/A;   VULVECTOMY N/A 02/11/2024   Procedure: WIDE EXCISION VULVECTOMY;  Surgeon: [name], MD;  Location: [facility];  Service: Gynecology;  Laterality: N/A;   WISDOM TOOTH EXTRACTION      Family History  Problem Relation Age of Onset   Stroke Mother    Heart disease Mother    Hypertension Father    Diabetes Father    Hyperlipidemia Father    Breast cancer Sister    Breast cancer Sister    Diabetes Brother    Colon cancer Neg Hx    Ovarian cancer Neg Hx    Endometrial cancer Neg Hx    Pancreatic cancer Neg Hx    Prostate cancer Neg Hx     Social History   Socioeconomic History   Marital status: Married    Spouse name: Not on file   Number of children: 1   Years of education: Not on file   Highest education level: Bachelor's degree (e.g., BA, AB, BS)  Occupational History   Occupation: Home    Employer: RETIRED  Tobacco Use   Smoking status: Never   Smokeless tobacco: Never   Vaping Use   Vaping status: Never Used  Substance and Sexual Activity   Alcohol use: Yes    Comment: Only on cruises or rarely   Drug use: No   Sexual activity: Not Currently    Birth control/protection: Post-menopausal  Other Topics Concern   Not on file  Social History Narrative   Moved here from [place]   Social Drivers of Health   Financial Resource Strain: Low Risk  (03/02/2024)   [facility]   Overall Financial Resource Strain (CARDIA)    Difficulty of Paying Living Expenses: Not hard at all  Food Insecurity: No Food Insecurity (03/02/2024)   [facility]   Hunger Vital Sign    Worried About Running Out of Food in the Last Year: Never true    Ran Out of Food in the Last Year: Never true  Transportation Needs: No Transportation Needs (03/02/2024)  Received from [facility] - Transportation    In the past 12 months, has lack of transportation kept you from medical appointments or from getting medications?: No    Lack of Transportation (Non-Medical): No  Physical Activity: Sufficiently Active (12/16/2023)   Exercise Vital Sign    Days of Exercise per Week: 3 days    Minutes of Exercise per Session: 60 min  Stress: No Stress Concern Present (12/16/2023)   [employer] of Occupational Health - Occupational Stress Questionnaire    Feeling of Stress : Only a little  Recent Concern: Stress - Stress Concern Present (11/08/2023)   [employer] of Occupational Health - Occupational Stress Questionnaire    Feeling of Stress : To some extent  Social Connections: Socially Integrated (12/16/2023)   Social Connection and Isolation Panel [NHANES]    Frequency of Communication with Friends and Family: Three times a week    Frequency of Social Gatherings with Friends and Family: Once a week    Attends Religious Services: More than 4 times per year    Active Member of [employer] or Organizations: No     Attends Engineer, structural: More than 4 times per year    Marital Status: Married    Current Medications:  Current Outpatient Medications:    acetaminophen (TYLENOL) 650 MG CR tablet, Take 1,300 mg by mouth every 8 (eight) hours as needed for pain., Disp: , Rfl:    apixaban (ELIQUIS) 5 MG TABS tablet, Take 5 mg by mouth 2 (two) times daily., Disp: , Rfl:    ARIPiprazole (ABILIFY) 10 MG tablet, Take 10 mg by mouth at bedtime., Disp: , Rfl:    aspirin EC 81 MG tablet, Take 1 tablet (81 mg total) by mouth daily. Swallow whole., Disp: 30 tablet, Rfl: 0   Biotin 5000 MCG CAPS, Take 5,000 mcg by mouth daily., Disp: , Rfl:    buPROPion (WELLBUTRIN XL) 300 MG 24 hr tablet, Take 300 mg by mouth daily., Disp: , Rfl:    cetirizine (ZYRTEC ALLERGY) 10 MG tablet, Take 10 mg by mouth daily., Disp: , Rfl:    cholecalciferol (VITAMIN D3) 25 MCG (1000 UNIT) tablet, Take 1,000 Units by mouth daily., Disp: , Rfl:    clonazePAM (KLONOPIN) 1 MG tablet, Take 1 mg by mouth at bedtime., Disp: , Rfl:    Coenzyme Q10 (COQ10) 100 MG CAPS, Take 100 mg by mouth at bedtime., Disp: , Rfl:    famotidine (PEPCID) 20 MG tablet, TAKE 1 TABLET TWICE DAILY, Disp: 180 tablet, Rfl: 1   fluticasone (FLONASE) 50 MCG/ACT nasal spray, Place 2 sprays into both nostrils daily., Disp: , Rfl:    hydrOXYzine (ATARAX) 25 MG tablet, Take 1 tablet (25 mg total) by mouth every 6 (six) hours., Disp: 12 tablet, Rfl: 0   lamoTRIgine (LAMICTAL) 200 MG tablet, Take 200 mg by mouth at bedtime., Disp: , Rfl:    levothyroxine (SYNTHROID) 50 MCG tablet, TAKE 1 TABLET EVERY DAY BEFORE BREAKFAST, Disp: 90 tablet, Rfl: 1   lisinopril (ZESTRIL) 2.5 MG tablet, Take 1 tablet (2.5 mg total) by mouth daily., Disp: 30 tablet, Rfl: 0   lovastatin (MEVACOR) 40 MG tablet, TAKE 1 TABLET EVERY DAY, Disp: 90 tablet, Rfl: 1   methylPREDNISolone (MEDROL DOSEPAK) 4 MG TBPK tablet, Take as directed, Disp: 21 each, Rfl: 0   nystatin (MYCOSTATIN/NYSTOP) powder,  Apply 1 Application topically 3 (three) times daily. Under abdomen skin fold, Disp: 15 g, Rfl: 2  predniSONE (STERAPRED UNI-PAK 21 TAB) 10 MG (21) TBPK tablet, Take by mouth daily. Take 6 tabs by mouth daily  for 1 days, then 5 tabs for 1 days, then 4 tabs for 1 days, then 3 tabs for 1 days, 2 tabs for 1 days, then 1 tab by mouth daily for 1 days, Disp: 21 tablet, Rfl: 0   torsemide (DEMADEX) 20 MG tablet, Take 20 mg by mouth daily., Disp: , Rfl:    zolpidem (AMBIEN) 10 MG tablet, Take 10 mg by mouth at bedtime., Disp: , Rfl:   Review of Symptoms: Complete 10-system review is positive for: shortness of breath  Physical Exam: BP (!) 131/59 (BP Location: Left Arm, Patient Position: Sitting)   Pulse (!) 56   Temp 99 F (37.2 C) (Oral)   Resp 20   Wt 197 lb 6.4 oz (89.5 kg)   SpO2 98%   BMI 37.30 kg/m  General: Alert, oriented, no acute distress. HEENT: Normocephalic, atraumatic. Neck symmetric without masses. Sclera anicteric.  Chest: Normal work of breathing. . Extremities: Grossly normal range of motion.  Warm, well perfused.  Edema bilateral ankles Skin: No rashes or lesions noted. GU: External genitalia with well healing anterior vulvar resection. Superficial separation of most inferior aspect without drainage or erythema. Small dark blood from pinpoint opening at superior aspect, treated with silver nitrate. Detached sutures removed with scissors. Exam chaperoned by Kimberly [place], CMA   Laboratory & Radiologic Studies: Surgical pathology (02/11/24): A. VULVA, ANTERIOR, VULVECTOMY:  -  Skin with scar, no residual melanoma in situ identified (Melan-A  staining performed on all sections).

## 2024-03-10 ENCOUNTER — Ambulatory Visit: Admitting: Dermatology

## 2024-03-17 ENCOUNTER — Telehealth: Payer: Self-pay | Admitting: Podiatry

## 2024-03-17 DIAGNOSIS — R9431 Abnormal electrocardiogram [ECG] [EKG]: Secondary | ICD-10-CM | POA: Diagnosis not present

## 2024-03-17 DIAGNOSIS — I480 Paroxysmal atrial fibrillation: Secondary | ICD-10-CM | POA: Diagnosis not present

## 2024-03-17 DIAGNOSIS — I4510 Unspecified right bundle-branch block: Secondary | ICD-10-CM | POA: Diagnosis not present

## 2024-03-17 NOTE — Telephone Encounter (Signed)
 Patient is requesting to speak with Dr. Allena Katz regarding MRI results. Patient can be reached at [phone]

## 2024-03-24 ENCOUNTER — Encounter: Payer: Self-pay | Admitting: Dermatology

## 2024-03-24 ENCOUNTER — Ambulatory Visit (INDEPENDENT_AMBULATORY_CARE_PROVIDER_SITE_OTHER): Admitting: Dermatology

## 2024-03-24 DIAGNOSIS — L738 Other specified follicular disorders: Secondary | ICD-10-CM

## 2024-03-24 DIAGNOSIS — D2239 Melanocytic nevi of other parts of face: Secondary | ICD-10-CM | POA: Diagnosis not present

## 2024-03-24 DIAGNOSIS — L578 Other skin changes due to chronic exposure to nonionizing radiation: Secondary | ICD-10-CM

## 2024-03-24 DIAGNOSIS — L570 Actinic keratosis: Secondary | ICD-10-CM | POA: Diagnosis not present

## 2024-03-24 DIAGNOSIS — Z86006 Personal history of melanoma in-situ: Secondary | ICD-10-CM

## 2024-03-24 DIAGNOSIS — Z85828 Personal history of other malignant neoplasm of skin: Secondary | ICD-10-CM

## 2024-03-24 DIAGNOSIS — L814 Other melanin hyperpigmentation: Secondary | ICD-10-CM | POA: Diagnosis not present

## 2024-03-24 DIAGNOSIS — Z1283 Encounter for screening for malignant neoplasm of skin: Secondary | ICD-10-CM

## 2024-03-24 DIAGNOSIS — D229 Melanocytic nevi, unspecified: Secondary | ICD-10-CM

## 2024-03-24 DIAGNOSIS — L821 Other seborrheic keratosis: Secondary | ICD-10-CM

## 2024-03-24 DIAGNOSIS — L820 Inflamed seborrheic keratosis: Secondary | ICD-10-CM

## 2024-03-24 DIAGNOSIS — Z86018 Personal history of other benign neoplasm: Secondary | ICD-10-CM

## 2024-03-24 DIAGNOSIS — L719 Rosacea, unspecified: Secondary | ICD-10-CM

## 2024-03-24 DIAGNOSIS — L720 Epidermal cyst: Secondary | ICD-10-CM

## 2024-03-24 DIAGNOSIS — D1801 Hemangioma of skin and subcutaneous tissue: Secondary | ICD-10-CM

## 2024-03-24 DIAGNOSIS — Q828 Other specified congenital malformations of skin: Secondary | ICD-10-CM

## 2024-03-24 DIAGNOSIS — W908XXA Exposure to other nonionizing radiation, initial encounter: Secondary | ICD-10-CM

## 2024-03-24 MED ORDER — AZELAIC ACID 15 % EX GEL: CUTANEOUS | 5 refills | Status: DC

## 2024-03-24 NOTE — Patient Instructions (Addendum)
 Cryotherapy Aftercare  Wash gently with soap and water everyday.   Apply Vaseline Jelly daily until healed.   Milia Sometimes these will clear with nightly OTC adapalene/Differin 0.1% gel or retinol, Effaclar.   Rosacea: Start Finacea 15% gel once or twice daily to face.   Recommend daily broad spectrum sunscreen SPF 30+ to sun-exposed areas, reapply every 2 hours as needed. Call for new or changing lesions.  Staying in the shade or wearing long sleeves, sun glasses (UVA+UVB protection) and wide brim hats (4-inch brim around the entire circumference of the hat) are also recommended for sun protection.      Melanoma ABCDEs  Melanoma is the most dangerous type of skin cancer, and is the leading cause of death from skin disease.  You are more likely to develop melanoma if you: Have light-colored skin, light-colored eyes, or red or blond hair Spend a lot of time in the sun Tan regularly, either outdoors or in a tanning bed Have had blistering sunburns, especially during childhood Have a close family member who has had a melanoma Have atypical moles or large birthmarks  Early detection of melanoma is key since treatment is typically straightforward and cure rates are extremely high if we catch it early.   The first sign of melanoma is often a change in a mole or a new dark spot.  The ABCDE system is a way of remembering the signs of melanoma.  A for asymmetry:  The two halves do not match. B for border:  The edges of the growth are irregular. C for color:  A mixture of colors are present instead of an even brown color. D for diameter:  Melanomas are usually (but not always) greater than 6mm - the size of a pencil eraser. E for evolution:  The spot keeps changing in size, shape, and color.  Please check your skin once per month between visits. You can use a small mirror in front and a large mirror behind you to keep an eye on the back side or your body.   If you see any new or  changing lesions before your next follow-up, please call to schedule a visit.  Please continue daily skin protection including broad spectrum sunscreen SPF 30+ to sun-exposed areas, reapplying every 2 hours as needed when you're outdoors.   Staying in the shade or wearing long sleeves, sun glasses (UVA+UVB protection) and wide brim hats (4-inch brim around the entire circumference of the hat) are also recommended for sun protection.      Due to recent changes in healthcare laws, you may see results of your pathology and/or laboratory studies on MyChart before the doctors have had a chance to review them. We understand that in some cases there may be results that are confusing or concerning to you. Please understand that not all results are received at the same time and often the doctors may need to interpret multiple results in order to provide you with the best plan of care or course of treatment. Therefore, we ask that you please give [place] 2 business days to thoroughly review all your results before contacting the office for clarification. Should we see a critical lab result, you will be contacted sooner.   If You Need Anything After Your Visit  If you have any questions or concerns for your doctor, please call our main line at [phone] and press option 4 to reach your doctor's medical assistant. If no one answers, please leave a voicemail as directed and  we will return your call as soon as possible. Messages left after 4 pm will be answered the following business day.   You may also send [place] a message via MyChart. We typically respond to MyChart messages within 1-2 business days.  For prescription refills, please ask your pharmacy to contact our office. Our fax number is [phone].  If you have an urgent issue when the clinic is closed that cannot wait until the next business day, you can page your doctor at the number below.    Please note that while we do our best to be available for  urgent issues outside of office hours, we are not available 24/7.   If you have an urgent issue and are unable to reach [place], you may choose to seek medical care at your doctor's office, retail clinic, urgent care center, or emergency room.  If you have a medical emergency, please immediately call 911 or go to the emergency department.  Pager Numbers  - Dr. Gwen Pounds: [phone]  - Dr. Roseanne Reno: [phone]  - Dr. Katrinka Blazing: [phone]   In the event of inclement weather, please call our main line at [phone] for an update on the status of any delays or closures.  Dermatology Medication Tips: Please keep the boxes that topical medications come in in order to help keep track of the instructions about where and how to use these. Pharmacies typically print the medication instructions only on the boxes and not directly on the medication tubes.   If your medication is too expensive, please contact our office at [phone] option 4 or send [place] a message through MyChart.   We are unable to tell what your co-pay for medications will be in advance as this is different depending on your insurance coverage. However, we may be able to find a substitute medication at lower cost or fill out paperwork to get insurance to cover a needed medication.   If a prior authorization is required to get your medication covered by your insurance company, please allow [place] 1-2 business days to complete this process.  Drug prices often vary depending on where the prescription is filled and some pharmacies may offer cheaper prices.  The website www.goodrx.com contains coupons for medications through different pharmacies. The prices here do not account for what the cost may be with help from insurance (it may be cheaper with your insurance), but the website can give you the price if you did not use any insurance.  - You can print the associated coupon and take it with your prescription to the pharmacy.  - You may also  stop by our office during regular business hours and pick up a GoodRx coupon card.  - If you need your prescription sent electronically to a different pharmacy, notify our office through [facility] or by phone at [phone] option 4.     Si Usted Necesita Algo Despus de Su Visita  Tambin puede enviarnos un mensaje a travs de Clinical cytogeneticist. Por lo general respondemos a los mensajes de MyChart en el transcurso de 1 a 2 das hbiles.  Para renovar recetas, por favor pida a su farmacia que se ponga en contacto con nuestra oficina. Annie Sable de fax es [place] [phone].  Si tiene un asunto urgente cuando la clnica est cerrada y que no puede esperar hasta el siguiente da hbil, puede llamar/localizar a su doctor(a) al nmero que aparece a continuacin.   Por favor, tenga en cuenta que aunque hacemos todo lo posible para estar  disponibles para asuntos urgentes fuera del horario de [place], no estamos disponibles las 24 horas del da, los 7 [address]  [address] de la [place].   Si tiene un problema urgente y no puede comunicarse con nosotros, puede optar por buscar atencin mdica  en el consultorio de su doctor(a), en una clnica privada, en un centro de atencin urgente o en una sala de emergencias.  Si tiene Engineer, drilling, por favor llame inmediatamente al 911 o vaya a la sala de emergencias.  Nmeros de bper  - Dr. Bary Likes: [phone]  - Dra. Annette Barters: [phone]  - Dr. Felipe Horton: [phone]   En caso de inclemencias del tiempo, por favor llame a Lajuan Pila principal al [phone] para una actualizacin sobre el [place] de cualquier retraso o cierre.  Consejos para la medicacin en dermatologa: Por favor, guarde las cajas en las que vienen los medicamentos de uso tpico para ayudarle a seguir las instrucciones sobre dnde y cmo usarlos. Las farmacias generalmente imprimen las instrucciones del medicamento slo en las cajas y no directamente en los tubos del [place].    Si su medicamento es muy caro, por favor, pngase en contacto con Bettyjane Brunet llamando al [phone] y presione la opcin 4 o envenos un mensaje a travs de Clinical cytogeneticist.   No podemos decirle cul ser su copago por los medicamentos por adelantado ya que esto es diferente dependiendo de la cobertura de su seguro. Sin embargo, es posible que podamos encontrar un medicamento sustituto a Audiological scientist un formulario para que el seguro cubra el medicamento que se considera necesario.   Si se requiere una autorizacin previa para que su compaa de seguros [place] su medicamento, por favor permtanos de 1 a 2 das hbiles para completar este proceso.  Los precios de los medicamentos varan con frecuencia dependiendo del Environmental consultant de dnde se surte la receta y alguna farmacias pueden ofrecer precios ms baratos.  El sitio web www.goodrx.com tiene cupones para medicamentos de Health and safety inspector. Los precios aqu no tienen en cuenta lo que podra costar con la ayuda del seguro (puede ser ms barato con su seguro), pero el sitio web puede darle el precio si no utiliz Tourist information centre manager.  - Puede imprimir el cupn correspondiente y llevarlo con su receta a la farmacia.  - Tambin puede pasar por nuestra oficina durante el horario de atencin regular y Education officer, museum una tarjeta de cupones de GoodRx.  - Si necesita que su receta se enve electrnicamente a una farmacia diferente, informe a nuestra oficina a travs de MyChart de [place] o por telfono llamando al [phone] y presione la opcin 4.

## 2024-03-24 NOTE — Progress Notes (Signed)
 Follow-Up Visit   Subjective  [name] [name] is a 68 y.o. female who presents for the following: Skin Cancer Screening and Full Body Skin Exam. Hx of BCC, Hx of SCC, Hx of dysplastic nevus.   The patient presents for Total-Body Skin Exam (TBSE) for skin cancer screening and mole check. The patient has spots, moles and lesions to be evaluated, some may be new or changing and the patient may have concern these could be cancer.  She has spots on her nose and lower right leg to check. Lesion on lower right leg has changed. She also has irritated spots on cheek and arm that stay scaly. 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 States she had another surgery at area since last visit. MIS recurrence but margins were clear. Bx: 12/23/2023. Excised 02/11/2024.  Hx of rosacea. Metronidazole gel and cream both caused worsening of rosacea.   The following portions of the chart were reviewed this encounter and updated as appropriate: medications, allergies, medical history  Review of Systems:  No other skin or systemic complaints except as noted in HPI or Assessment and Plan.  Objective  Well appearing patient in no apparent distress; mood and affect are within normal limits.  A full examination was performed including scalp, head, eyes, ears, nose, lips, neck, chest, axillae, abdomen, back, buttocks, bilateral upper extremities, bilateral lower extremities, hands, feet, fingers, toes, fingernails, and toenails. All findings within normal limits unless otherwise noted below.   Relevant physical exam findings are noted in the Assessment and Plan.  Exam of nails limited by presence of nail polish.   Right Forearm x1, R malar cheek x1 (2) Erythematous keratotic or waxy stuck-on papule or plaque. Right Ankle - Anterior 9 mm Pink tan scaly macule with keratotic rim  at right anterior ankle   Left Lower Vermilion Lip x1 Erythematous thin papules/macules with  gritty scale.          Assessment & Plan   SKIN CANCER SCREENING PERFORMED TODAY.  ACTINIC DAMAGE - Chronic condition, secondary to cumulative UV/sun exposure - diffuse scaly erythematous macules with underlying dyspigmentation - Recommend daily broad spectrum sunscreen SPF 30+ to sun-exposed areas, reapply every 2 hours as needed.  - Staying in the shade or wearing long sleeves, sun glasses (UVA+UVB protection) and wide brim hats (4-inch brim around the entire circumference of the hat) are also recommended for sun protection.  - Call for new or changing lesions.  LENTIGINES, SEBORRHEIC KERATOSES, HEMANGIOMAS - Benign normal skin lesions - Benign-appearing - Call for any changes  MELANOCYTIC NEVI - Tan-brown and/or pink-flesh-colored symmetric macules and papules, including flesh papule on the nose - 6 mm flesh papule at left nasal root - Benign appearing on exam today - Observation - Call clinic for new or changing moles - Recommend daily use of broad spectrum spf 30+ sunscreen to sun-exposed areas.  - Check nails when remove polish.  HISTORY OF BASAL CELL CARCINOMA OF THE SKIN Right lower pretibia 10/2010 - No evidence of recurrence today - Recommend regular full body skin exams - Recommend daily broad spectrum sunscreen SPF 30+ to sun-exposed areas, reapply every 2 hours as needed.  - Call if any new or changing lesions are noted between office visits  HISTORY OF DYSPLASTIC NEVUS Spinal upper back, severe, exc 01/15/2022 No evidence of recurrence today Recommend regular full body skin exams Recommend daily broad spectrum sunscreen SPF 30+ to sun-exposed areas, reapply every 2  hours as needed.  Call if any new or changing lesions are noted between office visits  HISTORY OF MELANOMA IN SITU Right labia minora, exc 02/12/23, Dr Elbridge Greek (gynecologic oncology) Being followed every 3 months. Recurrence 12/23/2023, excised 02/11/2024, margins clear.  - Not  examined today. Pt declined exam - Recommend regular full body skin exams - Recommend daily broad spectrum sunscreen SPF 30+ to sun-exposed areas, reapply every 2 hours as needed.  - Call if any new or changing lesions are noted between office visits  HISTORY OF SQUAMOUS CELL CARCINOMA OF THE SKIN Right leg - No evidence of recurrence today - Recommend regular full body skin exams - Recommend daily broad spectrum sunscreen SPF 30+ to sun-exposed areas, reapply every 2 hours as needed.  - Call if any new or changing lesions are noted between office visits  Milia - tiny firm white papule at right inferior chin/jaw line - type of cyst - benign - sometimes these will clear with nightly OTC adapalene/Differin 0.1% gel or retinol. - may be extracted if symptomatic - observe  SEBORRHEIC KERATOSIS - Stuck-on, waxy, tan-brown papules and/or plaques at inframammary - Benign-appearing - Discussed benign etiology and prognosis. - Observe - Call for any changes  ROSACEA Exam Mid face erythema with telangiectasias +/- scattered inflammatory papules   Chronic and persistent condition with duration or expected duration over one year. Condition is symptomatic/ bothersome to patient. Not currently at goal.   Rosacea is a chronic progressive skin condition usually affecting the face of adults, causing redness and/or acne bumps. It is treatable but not curable. It sometimes affects the eyes (ocular rosacea) as well. It may respond to topical and/or systemic medication and can flare with stress, sun exposure, alcohol, exercise, topical steroids (including hydrocortisone/cortisone 10) and some foods.  Daily application of broad spectrum spf 30+ sunscreen to face is recommended to reduce flares.  Patient denies grittiness of the eyes  Treatment Plan Start Finacea 15% gel once or twice daily to face.   Sebaceous Hyperplasia - 5 x 3 mm thin pink-yellow papule at nasal dorsum, photo today -  Benign-appearing - Observe. Call for changes. Recheck on f/up  INFLAMED SEBORRHEIC KERATOSIS (2) Right Forearm x1, R malar cheek x1 (2) Symptomatic, irritating, patient would like treated. Destruction of lesion - Right Forearm x1, R malar cheek x1 (2)  Destruction method: cryotherapy   Informed consent: discussed and consent obtained   Lesion destroyed using liquid nitrogen: Yes   Region frozen until ice ball extended beyond lesion: Yes   Outcome: patient tolerated procedure well with no complications   Post-procedure details: wound care instructions given   Additional details:  Prior to procedure, discussed risks of blister formation, small wound, skin dyspigmentation, or rare scar following cryotherapy. Recommend Vaseline ointment to treated areas while healing.  POROKERATOSIS Right Ankle - Anterior Benign-appearing.  Observation.  Call clinic for new or changing lesions.  Recommend daily use of broad spectrum spf 30+ sunscreen to sun-exposed areas.   AK (ACTINIC KERATOSIS) Left Lower Vermilion Lip x1 Actinic keratoses are precancerous spots that appear secondary to cumulative UV radiation exposure/sun exposure over time. They are chronic with expected duration over 1 year. A portion of actinic keratoses will progress to squamous cell carcinoma of the skin. It is not possible to reliably predict which spots will progress to skin cancer and so treatment is recommended to prevent development of skin cancer.  Recommend daily broad spectrum sunscreen SPF 30+ to sun-exposed areas, reapply every  2 hours as needed.  Recommend staying in the shade or wearing long sleeves, sun glasses (UVA+UVB protection) and wide brim hats (4-inch brim around the entire circumference of the hat). Call for new or changing lesions. Destruction of lesion - Left Lower Vermilion Lip x1  Destruction method: cryotherapy   Informed consent: discussed and consent obtained   Lesion destroyed using liquid nitrogen: Yes    Region frozen until ice ball extended beyond lesion: Yes   Outcome: patient tolerated procedure well with no complications   Post-procedure details: wound care instructions given   Additional details:  Prior to procedure, discussed risks of blister formation, small wound, skin dyspigmentation, or rare scar following cryotherapy. Recommend Vaseline ointment to treated areas while healing.   Return in about 6 months (around 09/23/2024) for TBSE, Hx melanoma IS vulvar, Hx BCC, Hx SCC.Anastasio Balsam, CMA, am acting as scribe for Artemio Larry, MD.    Documentation: I have reviewed the above documentation for accuracy and completeness, and I agree with the above.  Artemio Larry, MD

## 2024-03-26 ENCOUNTER — Other Ambulatory Visit: Payer: Self-pay

## 2024-03-26 ENCOUNTER — Ambulatory Visit (INDEPENDENT_AMBULATORY_CARE_PROVIDER_SITE_OTHER)

## 2024-03-26 VITALS — BP 130/74 | HR 69 | Temp 98.0°F | Ht 61.0 in | Wt 192.4 lb

## 2024-03-26 DIAGNOSIS — I480 Paroxysmal atrial fibrillation: Secondary | ICD-10-CM | POA: Diagnosis not present

## 2024-03-26 DIAGNOSIS — R001 Bradycardia, unspecified: Secondary | ICD-10-CM | POA: Diagnosis not present

## 2024-03-26 DIAGNOSIS — R21 Rash and other nonspecific skin eruption: Secondary | ICD-10-CM

## 2024-03-26 MED ORDER — TACROLIMUS 0.1 % EX CREA: 1.0000 | TOPICAL_CREAM | Freq: Two times a day (BID) | CUTANEOUS | 0 refills | Status: DC

## 2024-03-26 MED ORDER — HYDROXYZINE PAMOATE 25 MG PO CAPS: 25.0000 mg | ORAL_CAPSULE | Freq: Three times a day (TID) | ORAL | 0 refills | Status: DC | PRN

## 2024-03-26 MED ORDER — HYDROCORTISONE 1 % EX CREA
TOPICAL_CREAM | Freq: Two times a day (BID) | CUTANEOUS | Status: DC
Start: 2024-03-26 — End: 2024-03-26

## 2024-03-26 NOTE — Telephone Encounter (Signed)
 I am out of the office and did not prescribe it but after review of record -suspect that would be fine , ok to change to ointment

## 2024-03-26 NOTE — Patient Instructions (Addendum)
  You can also use Tacrolimus 0.1 % cream, twice a day till the rash is gone.  You can take Hydroxyzine 25 mg tab, cautiously, 1 tab every 8 hourly as needed.    Use of mild cleansers mild soap, detergent. Avoidance of excessive and aggressive skin washing/scrubbing.   Cleansers (Cetaphil, Cerave, Neutrogena) Moisturizers (Cetaphil, CeraVe, Neutrogena)   Stop using all products that has added fragrance.   If difficulty breathing go to the ER. Otherwise follow up with your PCP if symptoms does not improve in 7-10 days despite above treatment.

## 2024-03-26 NOTE — Progress Notes (Signed)
   Acute Office Visit  Subjective:    Patient ID: [name] [name], female    DOB: Oct 15, 1956, 68 y.o.   MRN: [id number]  Chief Complaint  Patient presents with   Rash   Patient is in today for acute visit regarding following concerns:  1) Facial rash: Started 3 days ago, initially was localized to the right side now spread to the left. Patient reports she was used an older facial wipe before symptom onset. Rash got worse after she used body wash from [employer] about 2 days ago. She describes the rash to be itchy, burning, and a/w facial swelling. She had similar symptom in 02/26/24 and was seen at [facility] for it and treated with Prednisone. Patient reports using Hydrocortisone cream during last skin break made symptoms worse.   So far she has tried Benadryl, calamine lotion for pruritus which seems to cool skin. No diarrhea, stomach pain, nausea, vomiting, wheezing. No h/o anaphylaxis.     ROS: As per HPI    Objective:    BP 130/74   Pulse 69   Temp 98 F (36.7 C) (Oral)   Ht 5\' 1"  (1.549 m)   Wt 192 lb 6.4 oz (87.3 kg)   SpO2 98%   BMI 36.35 kg/m    Physical Exam Cardiovascular:     Rate and Rhythm: Normal rate.  Pulmonary:     Breath sounds: No wheezing.  Musculoskeletal:     Cervical back: Neck supple.  Skin:    Comments: Erythematous skin with papular rash on b/l cheek, right worse than left  Neurological:     Mental Status: She is alert and oriented to person, place, and time.  Psychiatric:        Mood and Affect: Mood normal.      No results found for any visits on 03/26/24.     Assessment & Plan:  Facial rash Assessment & Plan: Acute. D/D includes allergic /irritant contact dermatitis.  Use Tacrolimus 0.1 % cream, twice a day till the rash is gone.  Hydroxyzine 25 mg tab, cautiously, 1 tab every 8 hourly as needed. Medication s/e discussed with the patient.    Use of mild cleansers mild soap, detergent. Avoidance of excessive and aggressive skin  washing/scrubbing. Cleansers (Cetaphil, Cerave, Neutrogena) Moisturizers (Cetaphil, CeraVe, Neutrogena)  Stop using all products that has added fragrance.   If difficulty breathing go to the ER. Otherwise follow up with your PCP if symptoms does not improve in 7-10 days despite above treatment.   Orders: -     Tacrolimus; Apply 1 Film topically in the morning and at bedtime.  Dispense: 30 g; Refill: 0 -     hydrOXYzine Pamoate; Take 1 capsule (25 mg total) by mouth every 8 (eight) hours as needed for itching.  Dispense: 15 capsule; Refill: 0    Return if symptoms worsen or fail to improve, f/u with PCP in 1 week.  Jacklin Mascot, MD

## 2024-03-26 NOTE — Assessment & Plan Note (Signed)
 Acute. D/D includes allergic /irritant contact dermatitis.  Use Tacrolimus 0.1 % cream, twice a day till the rash is gone.  Hydroxyzine 25 mg tab, cautiously, 1 tab every 8 hourly as needed. Medication s/e discussed with the patient.    Use of mild cleansers mild soap, detergent. Avoidance of excessive and aggressive skin washing/scrubbing. Cleansers (Cetaphil, Cerave, Neutrogena) Moisturizers (Cetaphil, CeraVe, Neutrogena)  Stop using all products that has added fragrance.   If difficulty breathing go to the ER. Otherwise follow up with your PCP if symptoms does not improve in 7-10 days despite above treatment.

## 2024-03-30 ENCOUNTER — Ambulatory Visit: Payer: Medicare HMO | Admitting: Psychiatry

## 2024-03-31 ENCOUNTER — Encounter: Payer: Self-pay | Admitting: Gynecologic Oncology

## 2024-04-02 ENCOUNTER — Encounter: Payer: Self-pay | Admitting: Family Medicine

## 2024-04-02 ENCOUNTER — Ambulatory Visit: Admitting: Family Medicine

## 2024-04-02 VITALS — BP 128/84 | HR 55 | Temp 98.5°F | Ht 61.0 in | Wt 191.4 lb

## 2024-04-02 DIAGNOSIS — Z6836 Body mass index (BMI) 36.0-36.9, adult: Secondary | ICD-10-CM

## 2024-04-02 DIAGNOSIS — L719 Rosacea, unspecified: Secondary | ICD-10-CM

## 2024-04-02 DIAGNOSIS — E66812 Obesity, class 2: Secondary | ICD-10-CM | POA: Diagnosis not present

## 2024-04-02 DIAGNOSIS — R21 Rash and other nonspecific skin eruption: Secondary | ICD-10-CM | POA: Diagnosis not present

## 2024-04-02 NOTE — Patient Instructions (Addendum)
 Continue to avoid scented things  When you get new detergent -get scent free also  Stick with gentle products   Continue the tacrolimus  ointment twice daily for another week and then if still doing better switch to using as needed   Wait a while to start the finacea  gel from Dr Annette Barters   Keep up the good work with [employer]

## 2024-04-02 NOTE — Assessment & Plan Note (Signed)
 Contact dermatitis -treated with 0.1% tacrolimus  ointment  Reviewed note from [place] office  May have been caused by allergy to a scented shampoo  Has since stopped scented products   Much improved today  Rash is almost gone (face and hand)  Instructed to continue the ointment bid for another week/then prn   Reviewed last derm note from Dr Annette Barters from [place] note she has not yet started finacea  for rosacea , instructed to hold off until allergic dermatitis is completely resolved   Call back and Er precautions noted in detail today

## 2024-04-02 NOTE — Progress Notes (Signed)
 Subjective:    Patient ID: [name] [name], female    DOB: 04/15/1956, 68 y.o.   MRN: [id number]  HPI  Wt Readings from Last 3 Encounters:  04/02/24 191 lb 6 oz (86.8 kg)  03/26/24 192 lb 6.4 oz (87.3 kg)  03/02/24 197 lb 6.4 oz (89.5 kg)   36.16 kg/m  Vitals:   04/02/24 1110  BP: 128/84  Pulse: (!) 55  Temp: 98.5 F (36.9 C)  SpO2: 98%   Pt presents for follow up of rash   she was seen on 4/17 at [facility] office for 3 days of rash that started after us  of a facial wipe and then worsened after use of scented body wash Was itchy/burning and miserable  Was also on left hand/ much    Of note-happened also in mid march and was seen in [facility]/ used prednisone    She was dx with allergic/contact dermatitis Prescribed tacrolimus  0.1% ointment  (makes it more red right after use for 30 minutes before it calms down)  Hydroxyzine   Told to avoid scented facial products   Is improved now  Clearing up  Less uncomfortable    It looks like Dr Annette Barters sent in azelaic acid  gel 15% in mid April -never started it  Sees Dr Annette Barters  Has rosacea and noted metro gel and cream actually worsened it in the past    Patient Active Problem List   Diagnosis Date Noted   Rosacea 04/02/2024   Pulmonary hypertension, unspecified (HCC) 12/31/2023   Edema of lower extremity 12/31/2023   Chronic kidney disease, stage 3a (HCC) 12/31/2023   Anemia 10/29/2023   Chest pain 10/22/2023   NSTEMI (non-ST elevated myocardial infarction) (HCC) 10/22/2023   S/P ablation of atrial fibrillation 09/30/2023   Chronic anticoagulation 09/30/2023   Pain of left hip 09/11/2023   History of anal fissures 09/09/2023   Acute left ankle pain 08/09/2023   Osteopenia 07/30/2023   Headache 07/05/2023   Paroxysmal atrial fibrillation (HCC) 06/27/2023   Bradycardia 05/30/2023   Melanoma in situ (HCC) 12/25/2022   ASCUS of cervix with negative high risk HPV 06/25/2022   Osteoarthritis of knee 05/25/2022    Hemorrhoid 04/25/2022   Estrogen deficiency 01/02/2022   Bipolar disorder, in partial remission, most recent episode mixed (HCC) 02/25/2020   Dyspnea 01/19/2020   Prediabetes 03/03/2018   Atrophic vaginitis 07/08/2017   Cervical stenosis (uterine cervix) 01/03/2017   Hearing loss 10/25/2015   Routine general medical examination at a health care facility 10/17/2015   Facial rash 02/22/2015   Decreased GFR 10/22/2014   Colon cancer screening 10/16/2013   Enthesopathy of ankle and tarsus 12/01/2009   Enthesopathy of ankle and tarsus 12/01/2009   Asymptomatic postmenopausal status 09/30/2009   Arthropathy of pelvic region and thigh 03/10/2009   DEGENERATIVE DISC DISEASE, LUMBAR SPINE 03/10/2009   DDD (degenerative disc disease), lumbosacral 03/10/2009   ONYCHOMYCOSIS 09/17/2008   Hypothyroidism 08/05/2007   Morbid obesity (HCC) 08/05/2007   Bipolar disorder (HCC) 08/05/2007   Allergic rhinitis 08/05/2007   Irritable bowel syndrome 08/05/2007   Esophageal reflux 08/05/2007   HYPERCHOLESTEROLEMIA, PURE 07/07/2007   Past Medical History:  Diagnosis Date   Actinic keratosis 03/29/2021   R central upper lip   Allergic rhinitis    Allergy    Anemia 10/29/2023   Anxiety    Arthritis    Atypical mole 12/26/2021   spinal upper back, exc 01/15/22   Basal cell carcinoma 10/16/2010   R lower pretibia    Bipolar  1 disorder (HCC)    Chronic kidney disease    Chronic kidney disease    Depression    Dyspnea    Fissure, anal    GERD (gastroesophageal reflux disease)    History of basal cell carcinoma excision    ON FACE   History of squamous cell carcinoma excision    ON RIGHT LEG   Hyperlipidemia    Hypothyroidism    IBS (irritable bowel syndrome)    Liver cyst    Malignant melanoma in situ (HCC) 11/20/2022   Right labia minora. Excision 12/25/2022 at anterior vulva, left of clitoral hood, margins involved. Surgery scheduled for 02/12/2023. Recurrence 12/23/2023, excised 02/11/2024,  margins clear.   Mallet finger of left hand    small finger   NSTEMI (non-ST elevated myocardial infarction) (HCC) 10/22/2023   Pneumonia    Pre-diabetes    Past Surgical History:  Procedure Laterality Date   ABLATION     CARDIAC CATHETERIZATION  04/10/1999   per pt normal   CARDIOVASCULAR STRESS TEST  02-23-2011  dr Aimee Houseman [facility] clinic)   normal nuclear study/  no ischemia/  ef 64%   CARDIOVERSION N/A 01/10/2024   Procedure: CARDIOVERSION;  Surgeon: Dorita Garter, MD;  Location: [facility] OR[facility];  Service: Cardiovascular;  Laterality: N/A;   CATARACT EXTRACTION W/ INTRAOCULAR LENS  IMPLANT, BILATERAL  12/10/2001   Bil   CESAREAN SECTION  12/10/1982   CLOSED REDUCTION FINGER WITH PERCUTANEOUS PINNING Left 04/22/2014   Procedure: LEFT SMALL FINGER CLOSED REDUCTION PINNING;  Surgeon: Shellie Dials, MD;  Location: [facility];  Service: Orthopedics;  Laterality: Left;   COLONOSCOPY  11/09/2002   DILATION AND CURETTAGE OF UTERUS  1977   LAPAROSCOPIC CHOLECYSTECTOMY  03/18/2000   RECONSTRUCTION OF EYELID Bilateral 08/2022   RIGHT/LEFT HEART CATH AND CORONARY ANGIOGRAPHY Bilateral 11/25/2023   Procedure: RIGHT/LEFT HEART CATH AND CORONARY ANGIOGRAPHY;  Surgeon: Antonette Batters, MD;  Location: [facility];  Service: Cardiovascular;  Laterality: Bilateral;   TEE WITHOUT CARDIOVERSION N/A 01/10/2024   Procedure: TRANSESOPHAGEAL ECHOCARDIOGRAM (TEE);  Surgeon: Dorita Garter, MD;  Location: [facility];  Service: Cardiovascular;  Laterality: N/A;   TRANSTHORACIC ECHOCARDIOGRAM  02/23/2011   normal lvf/  ef 58%/  mild rve/   mild lae/  mild  mr  & tr/  mild pulmonary htn   VULVECTOMY N/A 12/25/2022   Procedure: PARTIAL  VULVECTOMY;  Surgeon: Derrel Flies, MD;  Location: [facility] [place];  Service: Gynecology;  Laterality: N/A;   VULVECTOMY N/A 02/12/2023   Procedure: WIDE LOCAL EXCISION OF VULVA AND REMOVAL OF CLITORIS;  Surgeon: Derrel Flies, MD;   Location: [facility] [place];  Service: Gynecology;  Laterality: N/A;   VULVECTOMY N/A 02/11/2024   Procedure: WIDE EXCISION VULVECTOMY;  Surgeon: Derrel Flies, MD;  Location: [facility];  Service: Gynecology;  Laterality: N/A;   WISDOM TOOTH EXTRACTION     Social History   Tobacco Use   Smoking status: Never   Smokeless tobacco: Never  Vaping Use   Vaping status: Never Used  Substance Use Topics   Alcohol use: Yes    Comment: Only on cruises or rarely   Drug use: No   Family History  Problem Relation Age of Onset   Stroke Mother    Heart disease Mother    Hypertension Father    Diabetes Father    Hyperlipidemia Father    Breast cancer Sister    Breast cancer Sister    Diabetes Brother  Colon cancer Neg Hx    Ovarian cancer Neg Hx    Endometrial cancer Neg Hx    Pancreatic cancer Neg Hx    Prostate cancer Neg Hx    Allergies  Allergen Reactions   Ingrezza [Valbenazine Tosylate] Other (See Comments)    Night sweats, worsened tardive dyskinesia, nightmares   Lidocaine  Swelling    ALL CAINE DRUGS EXCEPT: marcaine  & sensercaine    Xylocaine  [Lidocaine  Hcl] Swelling    ALLERGIC TO ALL CAINES EXCEPT SENSORCAINE  , AND MARCAINE    Albuterol Other (See Comments)    Leg swelling   Amantadine     Muscle pain and tremors    Austedo Xr [Deutetrabenazine]     Caused Muscle aches and tremors   Codeine Nausea And Vomiting    Vomits blood   Lithium Swelling   Singulair [Montelukast] Swelling    Leg swelling   Tegretol [Carbamazepine] Other (See Comments)    skin crawls   Current Outpatient Medications on File Prior to Visit  Medication Sig Dispense Refill   acetaminophen  (TYLENOL ) 650 MG CR tablet Take 1,300 mg by mouth every 8 (eight) hours as needed for pain.     apixaban (ELIQUIS) 5 MG TABS tablet Take 5 mg by mouth 2 (two) times daily.     ARIPiprazole  (ABILIFY ) 10 MG tablet Take 10 mg by mouth at bedtime.     aspirin  EC 81 MG tablet Take 1 tablet (81 mg total)  by mouth daily. Swallow whole. 30 tablet 0   Azelaic Acid  15 % gel Apply once or twice daily to face 50 g 5   Biotin 5000 MCG CAPS Take 5,000 mcg by mouth daily.     buPROPion  (WELLBUTRIN  XL) 300 MG 24 hr tablet Take 300 mg by mouth daily.     cetirizine (ZYRTEC ALLERGY) 10 MG tablet Take 10 mg by mouth daily.     cholecalciferol (VITAMIN D3) 25 MCG (1000 UNIT) tablet Take 1,000 Units by mouth daily.     clonazePAM  (KLONOPIN ) 1 MG tablet Take 1 mg by mouth at bedtime.     Coenzyme Q10 (COQ10) 100 MG CAPS Take 100 mg by mouth at bedtime.     famotidine  (PEPCID ) 20 MG tablet TAKE 1 TABLET TWICE DAILY 180 tablet 1   flecainide (TAMBOCOR) 50 MG tablet Take 1 tablet by mouth 2 (two) times daily.     fluticasone  (FLONASE ) 50 MCG/ACT nasal spray Place 2 sprays into both nostrils daily.     hydrOXYzine  (ATARAX ) 25 MG tablet Take 1 tablet (25 mg total) by mouth every 6 (six) hours. 12 tablet 0   hydrOXYzine  (VISTARIL ) 25 MG capsule Take 1 capsule (25 mg total) by mouth every 8 (eight) hours as needed for itching. 15 capsule 0   JARDIANCE 10 MG TABS tablet Take 10 mg by mouth daily.     lamoTRIgine  (LAMICTAL ) 200 MG tablet Take 200 mg by mouth at bedtime.     levothyroxine  (SYNTHROID ) 50 MCG tablet TAKE 1 TABLET EVERY DAY BEFORE BREAKFAST 90 tablet 1   lisinopril  (ZESTRIL ) 2.5 MG tablet Take 1 tablet (2.5 mg total) by mouth daily. 30 tablet 0   lovastatin  (MEVACOR ) 40 MG tablet TAKE 1 TABLET EVERY DAY 90 tablet 1   metoprolol succinate (TOPROL-XL) 25 MG 24 hr tablet Take by mouth.     montelukast (SINGULAIR) 10 MG tablet Take 1 tablet by mouth at bedtime.     nystatin  (MYCOSTATIN /NYSTOP ) powder Apply 1 Application topically 3 (three) times daily. Under abdomen  skin fold 15 g 2   tacrolimus  (PROTOPIC ) 0.1 % ointment APPLY 1 FILM TOPICALLY IN THE MORNING AND AT BEDTIME. 30 g 0   torsemide (DEMADEX) 20 MG tablet Take 20 mg by mouth daily.     zolpidem  (AMBIEN ) 10 MG tablet Take 10 mg by mouth at bedtime.      No current facility-administered medications on file prior to visit.    Review of Systems  Constitutional:  Negative for activity change, appetite change, fatigue, fever and unexpected weight change.       Has lost over 30 lb with weight watchers (is thrilled)!  HENT:  Negative for congestion, ear pain, rhinorrhea, sinus pressure and sore throat.   Eyes:  Negative for pain, redness and visual disturbance.  Respiratory:  Negative for cough, shortness of breath and wheezing.   Cardiovascular:  Negative for chest pain and palpitations.  Gastrointestinal:  Negative for abdominal pain, blood in stool, constipation and diarrhea.  Endocrine: Negative for polydipsia and polyuria.  Genitourinary:  Negative for dysuria, frequency and urgency.  Musculoskeletal:  Negative for arthralgias, back pain and myalgias.  Skin:  Positive for rash. Negative for pallor.       Rash is almost gone  Allergic/Immunologic: Negative for environmental allergies.  Neurological:  Negative for dizziness, syncope and headaches.  Hematological:  Negative for adenopathy. Does not bruise/bleed easily.  Psychiatric/Behavioral:  Negative for decreased concentration and dysphoric mood. The patient is not nervous/anxious.        Objective:   Physical Exam Constitutional:      General: She is not in acute distress.    Appearance: Normal appearance. She is obese. She is not ill-appearing.  HENT:     Head: Normocephalic and atraumatic.  Skin:    Comments: A few scant areas of erythema w/o scale on cheeks  Hand is clear   Some telangectasia on face/around nose  Some seb hyperplasia   Neurological:     Mental Status: She is alert.           Assessment & Plan:   Problem List Items Addressed This Visit       Musculoskeletal and Integument   Facial rash - Primary   Contact dermatitis -treated with 0.1% tacrolimus  ointment  Reviewed note from [facility] office  May have been caused by allergy to a scented  shampoo  Has since stopped scented products   Much improved today  Rash is almost gone (face and hand)  Instructed to continue the ointment bid for another week/then prn   Reviewed last derm note from Dr Annette Barters from [place] note she has not yet started finacea  for rosacea , instructed to hold off until allergic dermatitis is completely resolved   Call back and Er precautions noted in detail today           Other   Rosacea   Reviewed derm note, Dr Annette Barters from mid march Pt was prescription finacea  gel ( metro gel and cream worsened her condition)  Holding off on starting this until her facial contact derm is better   Encouraged to avoid sun/use sun protection

## 2024-04-02 NOTE — Assessment & Plan Note (Signed)
 Lost over 30 lb so far with weight watchers Commended !  Encouraged to keep up the good work

## 2024-04-02 NOTE — Assessment & Plan Note (Signed)
 Reviewed derm note, Dr Annette Barters from mid march Pt was prescription finacea  gel ( metro gel and cream worsened her condition)  Holding off on starting this until her facial contact derm is better   Encouraged to avoid sun/use sun protection

## 2024-04-14 ENCOUNTER — Ambulatory Visit (INDEPENDENT_AMBULATORY_CARE_PROVIDER_SITE_OTHER): Admitting: Psychology

## 2024-04-14 DIAGNOSIS — F3181 Bipolar II disorder: Secondary | ICD-10-CM | POA: Diagnosis not present

## 2024-04-14 DIAGNOSIS — I480 Paroxysmal atrial fibrillation: Secondary | ICD-10-CM | POA: Diagnosis not present

## 2024-04-14 NOTE — Progress Notes (Unsigned)
                edge,  experiencing concentration difficulties, having trouble falling or staying asleep, exhibiting a general  state of irritability).: No Description Entered (Status: improved). Motor tension (e.g., restlessness,  tiredness, shakiness, muscle tension).: No Description Entered (Status: improved).  Problems Addressed  Anxiety, Phase Of Life Problems, Anxiety  Goals 1. Learn and implement coping skills that result in a reduction of anxiety  and worry, and improved daily functioning. Objective Learn  and implement calming skills to reduce overall anxiety and manage anxiety symptoms. Target Date: 2025-08-09Frequency: Weekly Progress: 40 Modality: individual  Related Interventions 1. Teach the client calming/relaxation skills (e.g., applied relaxation, progressive muscle  relaxation, cue controlled relaxation; mindful breathing; biofeedback) and how to discriminate  better between relaxation and tension; teach the client how to apply these skills to his/her daily  life (e.g., New Directions in Progressive Muscle Relaxation by Marcelyn Ditty, and  Hazlett-Stevens; Treating Generalized Anxiety Disorder by Rygh and Ida Rogue). Objective Identify, challenge, and replace biased, fearful self-talk with positive, realistic, and empowering selftalk. Target Date: 2024-07-18 Frequency: weekly Progress: 30 Modality: individual Related Interventions 1. Explore the client's schema and self-talk that mediate his/her fear response; assist him/her in  challenging the biases; replace the distorted messages with reality-based alternatives and  positive, realistic self-talk that will increase his/her self-confidence in coping with irrational  fears (see Cognitive Therapy of Anxiety Disorders by Laurence Slate). Objective Learn and implement problem-solving strategies for realistically addressing worries. Target Date: 2025-08-09Frequency: weekly Progress: 40 Modality: individual 2. Resolve conflicted feelings and adapt to the new life circumstances. Objective Apply problem-solving skills to current circumstances. Target Date: 2024-07-18 Frequency: weekly Progress: 20 Modality: individual Related Interventions 1. Teach the client problem-resolution skills (e.g., defining the problem clearly, brainstorming  multiple solutions, listing the pros and cons of each solution, seeking input from others,  selecting and implementing a plan of action, evaluating outcome, and readjusting plan as   necessary).   3. Stabilize anxiety level while increasing ability to function on a daily  basis. Diagnosis F33.1  Major depressive disorder, moderate 300.02 (Generalized anxiety disorder) - Open - [Signifier: n/a]  Axis  none 309.28 (Adjustment disorder with mixed anxiety and depressed  mood) - Open - [Signifier: n/a]  Adjustment Disorder,  With Anxiety   Marital conflict  Major Depressive disorder, moderate  Conditions For Discharge Achievement of treatment goals and objectives.  The patient approved this plan.   Deonna Krummel G Ethridge Sollenberger, LCSW

## 2024-04-30 DIAGNOSIS — E7800 Pure hypercholesterolemia, unspecified: Secondary | ICD-10-CM | POA: Diagnosis not present

## 2024-04-30 DIAGNOSIS — I10 Essential (primary) hypertension: Secondary | ICD-10-CM | POA: Diagnosis not present

## 2024-04-30 DIAGNOSIS — G4733 Obstructive sleep apnea (adult) (pediatric): Secondary | ICD-10-CM | POA: Diagnosis not present

## 2024-04-30 DIAGNOSIS — I480 Paroxysmal atrial fibrillation: Secondary | ICD-10-CM | POA: Diagnosis not present

## 2024-04-30 DIAGNOSIS — I272 Pulmonary hypertension, unspecified: Secondary | ICD-10-CM | POA: Diagnosis not present

## 2024-04-30 DIAGNOSIS — Z9889 Other specified postprocedural states: Secondary | ICD-10-CM | POA: Diagnosis not present

## 2024-04-30 DIAGNOSIS — F319 Bipolar disorder, unspecified: Secondary | ICD-10-CM | POA: Diagnosis not present

## 2024-04-30 DIAGNOSIS — R001 Bradycardia, unspecified: Secondary | ICD-10-CM | POA: Diagnosis not present

## 2024-05-06 DIAGNOSIS — Z79899 Other long term (current) drug therapy: Secondary | ICD-10-CM | POA: Diagnosis not present

## 2024-05-06 DIAGNOSIS — Z5181 Encounter for therapeutic drug level monitoring: Secondary | ICD-10-CM | POA: Diagnosis not present

## 2024-05-06 DIAGNOSIS — I480 Paroxysmal atrial fibrillation: Secondary | ICD-10-CM | POA: Diagnosis not present

## 2024-05-24 ENCOUNTER — Telehealth: Payer: Self-pay | Admitting: Family Medicine

## 2024-05-24 DIAGNOSIS — R944 Abnormal results of kidney function studies: Secondary | ICD-10-CM

## 2024-05-24 DIAGNOSIS — D649 Anemia, unspecified: Secondary | ICD-10-CM

## 2024-05-24 DIAGNOSIS — E7800 Pure hypercholesterolemia, unspecified: Secondary | ICD-10-CM

## 2024-05-24 DIAGNOSIS — R7303 Prediabetes: Secondary | ICD-10-CM

## 2024-05-24 DIAGNOSIS — E039 Hypothyroidism, unspecified: Secondary | ICD-10-CM

## 2024-05-24 NOTE — Telephone Encounter (Signed)
-----   Message from Gerry Krone sent at 05/18/2024  9:04 AM EDT ----- Regarding: Lab orders for Mon, 6.16.25 Patient is scheduled for CPX labs, please order future labs, Thanks , Anselmo Kings

## 2024-05-25 ENCOUNTER — Other Ambulatory Visit (INDEPENDENT_AMBULATORY_CARE_PROVIDER_SITE_OTHER): Payer: Medicare HMO

## 2024-05-25 DIAGNOSIS — D649 Anemia, unspecified: Secondary | ICD-10-CM | POA: Diagnosis not present

## 2024-05-25 DIAGNOSIS — E039 Hypothyroidism, unspecified: Secondary | ICD-10-CM

## 2024-05-25 DIAGNOSIS — R944 Abnormal results of kidney function studies: Secondary | ICD-10-CM

## 2024-05-25 DIAGNOSIS — R7303 Prediabetes: Secondary | ICD-10-CM

## 2024-05-25 DIAGNOSIS — N1831 Chronic kidney disease, stage 3a: Secondary | ICD-10-CM | POA: Diagnosis not present

## 2024-05-25 DIAGNOSIS — E7800 Pure hypercholesterolemia, unspecified: Secondary | ICD-10-CM | POA: Diagnosis not present

## 2024-05-26 ENCOUNTER — Ambulatory Visit: Payer: Self-pay | Admitting: Family Medicine

## 2024-05-26 LAB — COMPREHENSIVE METABOLIC PANEL WITH GFR
AG Ratio: 2 (calc) (ref 1.0–2.5)
ALT: 11 U/L (ref 6–29)
AST: 16 U/L (ref 10–35)
Albumin: 4.5 g/dL (ref 3.6–5.1)
Alkaline phosphatase (APISO): 91 U/L (ref 37–153)
BUN/Creatinine Ratio: 15 (calc) (ref 6–22)
BUN: 18 mg/dL (ref 7–25)
CO2: 28 mmol/L (ref 20–32)
Calcium: 9.6 mg/dL (ref 8.6–10.4)
Chloride: 101 mmol/L (ref 98–110)
Creat: 1.18 mg/dL — ABNORMAL HIGH (ref 0.50–1.05)
Globulin: 2.3 g/dL (ref 1.9–3.7)
Glucose, Bld: 97 mg/dL (ref 65–99)
Potassium: 4.4 mmol/L (ref 3.5–5.3)
Sodium: 140 mmol/L (ref 135–146)
Total Bilirubin: 0.4 mg/dL (ref 0.2–1.2)
Total Protein: 6.8 g/dL (ref 6.1–8.1)
eGFR: 51 mL/min/1.73m2 — ABNORMAL LOW

## 2024-05-26 LAB — CBC WITH DIFFERENTIAL/PLATELET
Absolute Lymphocytes: 1686 {cells}/uL (ref 850–3900)
Absolute Monocytes: 456 {cells}/uL (ref 200–950)
Basophils Absolute: 48 {cells}/uL (ref 0–200)
Basophils Relative: 0.7 %
Eosinophils Absolute: 88 {cells}/uL (ref 15–500)
Eosinophils Relative: 1.3 %
HCT: 41.3 % (ref 35.0–45.0)
Hemoglobin: 13.2 g/dL (ref 11.7–15.5)
MCH: 28.9 pg (ref 27.0–33.0)
MCHC: 32 g/dL (ref 32.0–36.0)
MCV: 90.6 fL (ref 80.0–100.0)
MPV: 9.7 fL (ref 7.5–12.5)
Monocytes Relative: 6.7 %
Neutro Abs: 4522 {cells}/uL (ref 1500–7800)
Neutrophils Relative %: 66.5 %
Platelets: 297 10*3/uL (ref 140–400)
RBC: 4.56 10*6/uL (ref 3.80–5.10)
RDW: 14.7 % (ref 11.0–15.0)
Total Lymphocyte: 24.8 %
WBC: 6.8 10*3/uL (ref 3.8–10.8)

## 2024-05-26 LAB — TSH: TSH: 2.2 m[IU]/L (ref 0.40–4.50)

## 2024-05-26 LAB — VITAMIN D 25 HYDROXY (VIT D DEFICIENCY, FRACTURES): Vit D, 25-Hydroxy: 75 ng/mL (ref 30–100)

## 2024-05-26 LAB — HEMOGLOBIN A1C
Hgb A1c MFr Bld: 5.7 % — ABNORMAL HIGH
Mean Plasma Glucose: 117 mg/dL
eAG (mmol/L): 6.5 mmol/L

## 2024-05-26 LAB — LIPID PANEL
Cholesterol: 164 mg/dL (ref <200)
HDL: 65 mg/dL (ref >=50)
LDL Cholesterol (Calc): 76 mg/dL
Non-HDL Cholesterol (Calc): 99 mg/dL (ref <130)
Total CHOL/HDL Ratio: 2.5 (calc) (ref <5.0)
Triglycerides: 147 mg/dL (ref <150)

## 2024-05-27 ENCOUNTER — Ambulatory Visit (INDEPENDENT_AMBULATORY_CARE_PROVIDER_SITE_OTHER): Payer: Medicare HMO

## 2024-05-27 VITALS — Ht 61.5 in | Wt 179.0 lb

## 2024-05-27 DIAGNOSIS — Z0000 Encounter for general adult medical examination without abnormal findings: Secondary | ICD-10-CM

## 2024-05-27 NOTE — Progress Notes (Signed)
 Subjective:   [name] [name] is a 68 y.o. who presents for a Medicare Wellness preventive visit.  As a reminder, Annual Wellness Visits don't include a physical exam, and some assessments may be limited, especially if this visit is performed virtually. We may recommend an in-person follow-up visit with your provider if needed.  Visit Complete: Virtual I connected with  Sameul Crew on 05/27/24 by a audio enabled telemedicine application and verified that I am speaking with the correct person using two identifiers.  Patient Location: Home  Provider Location: Office/Clinic  I discussed the limitations of evaluation and management by telemedicine. The patient expressed understanding and agreed to proceed.  Vital Signs: Because this visit was a virtual/telehealth visit, some criteria may be missing or patient reported. Any vitals not documented were not able to be obtained and vitals that have been documented are patient reported.  VideoDeclined- This patient declined Librarian, academic. Therefore the visit was completed with audio only.  Persons Participating in Visit: Patient.  AWV Questionnaire: Yes: Patient Medicare AWV questionnaire was completed by the patient on 05/24/24; I have confirmed that all information answered by patient is correct and no changes since this date.  Cardiac Risk Factors include: advanced age (>69men, >73 women);dyslipidemia;hypertension;obesity (BMI >30kg/m2)     Objective:    Today's Vitals   05/24/24 1328 05/27/24 1305  Weight:  179 lb (81.2 kg)  Height:  5' 1.5 (1.562 m)  PainSc: 5     Body mass index is 33.27 kg/m.     05/27/2024    1:15 PM 01/31/2024   11:11 AM 01/10/2024   11:31 AM 11/25/2023   10:44 AM 10/23/2023   12:00 AM 10/22/2023   11:50 AM 10/22/2023   11:05 AM  Advanced Directives  Does Patient Have a Medical Advance Directive? Yes Yes Yes Yes  Yes Yes  Type of Estate agent of  [place];Living will Healthcare Power of [place];Living will Living will Living will Living will Living will Healthcare Power of Attorney;Living will  Does patient want to make changes to medical advance directive?  No - Patient declined       Copy of Healthcare Power of Attorney in Chart? Yes - validated most recent copy scanned in chart (See row information)          Current Medications (verified) Outpatient Encounter Medications as of 05/27/2024  Medication Sig   acetaminophen  (TYLENOL ) 650 MG CR tablet Take 1,300 mg by mouth every 8 (eight) hours as needed for pain.   apixaban (ELIQUIS) 5 MG TABS tablet Take 5 mg by mouth 2 (two) times daily.   ARIPiprazole  (ABILIFY ) 10 MG tablet Take 10 mg by mouth at bedtime.   Azelaic Acid  15 % gel Apply once or twice daily to face   Biotin 5000 MCG CAPS Take 5,000 mcg by mouth daily.   buPROPion  (WELLBUTRIN  XL) 300 MG 24 hr tablet Take 300 mg by mouth daily.   cetirizine (ZYRTEC ALLERGY) 10 MG tablet Take 10 mg by mouth daily.   cholecalciferol (VITAMIN D3) 25 MCG (1000 UNIT) tablet Take 1,000 Units by mouth daily.   clonazePAM  (KLONOPIN ) 1 MG tablet Take 1 mg by mouth at bedtime.   Coenzyme Q10 (COQ10) 100 MG CAPS Take 100 mg by mouth at bedtime.   famotidine  (PEPCID ) 20 MG tablet TAKE 1 TABLET TWICE DAILY   fluticasone  (FLONASE ) 50 MCG/ACT nasal spray Place 2 sprays into both nostrils daily.   hydrOXYzine  (VISTARIL ) 25 MG capsule  Take 1 capsule (25 mg total) by mouth every 8 (eight) hours as needed for itching.   JARDIANCE 10 MG TABS tablet Take 10 mg by mouth daily.   lamoTRIgine  (LAMICTAL ) 200 MG tablet Take 200 mg by mouth at bedtime.   levothyroxine  (SYNTHROID ) 50 MCG tablet TAKE 1 TABLET EVERY DAY BEFORE BREAKFAST   lisinopril  (ZESTRIL ) 2.5 MG tablet Take 1 tablet (2.5 mg total) by mouth daily.   lovastatin  (MEVACOR ) 40 MG tablet TAKE 1 TABLET EVERY DAY   nystatin  (MYCOSTATIN /NYSTOP ) powder Apply 1 Application topically 3 (three) times daily.  Under abdomen skin fold   tacrolimus  (PROTOPIC ) 0.1 % ointment APPLY 1 FILM TOPICALLY IN THE MORNING AND AT BEDTIME.   torsemide (DEMADEX) 20 MG tablet Take 20 mg by mouth daily.   zolpidem  (AMBIEN ) 10 MG tablet Take 10 mg by mouth at bedtime.   No facility-administered encounter medications on file as of 05/27/2024.    Allergies (verified) Ingrezza [valbenazine tosylate], Lidocaine , Xylocaine  [lidocaine  hcl], Albuterol, Amantadine, Austedo xr [deutetrabenazine], Codeine, Lithium, Singulair [montelukast], and Tegretol [carbamazepine]   History: Past Medical History:  Diagnosis Date   Actinic keratosis 03/29/2021   R central upper lip   Allergic rhinitis    Allergy    Anemia 10/29/2023   Anxiety    Arthritis    Atypical mole 12/26/2021   spinal upper back, exc 01/15/22   Basal cell carcinoma 10/16/2010   R lower pretibia    Bipolar 1 disorder (HCC)    Chronic kidney disease    Chronic kidney disease    Depression    Dyspnea    Fissure, anal    GERD (gastroesophageal reflux disease)    History of basal cell carcinoma excision    ON FACE   History of squamous cell carcinoma excision    ON RIGHT LEG   Hyperlipidemia    Hypothyroidism    IBS (irritable bowel syndrome)    Liver cyst    Malignant melanoma in situ (HCC) 11/20/2022   Right labia minora. Excision 12/25/2022 at anterior vulva, left of clitoral hood, margins involved. Surgery scheduled for 02/12/2023. Recurrence 12/23/2023, excised 02/11/2024, margins clear.   Mallet finger of left hand    small finger   NSTEMI (non-ST elevated myocardial infarction) (HCC) 10/22/2023   Pneumonia    Pre-diabetes    Past Surgical History:  Procedure Laterality Date   ABLATION     CARDIAC CATHETERIZATION  04/10/1999   per pt normal   CARDIOVASCULAR STRESS TEST  02-23-2011  dr Aimee Houseman [facility])   normal nuclear study/  no ischemia/  ef 64%   CARDIOVERSION N/A 01/10/2024   Procedure: CARDIOVERSION;  Surgeon: Dorita Garter, MD;  Location: [facility] ORS;  Service: Cardiovascular;  Laterality: N/A;   CATARACT EXTRACTION W/ INTRAOCULAR LENS  IMPLANT, BILATERAL  12/10/2001   Bil   CESAREAN SECTION  12/10/1982   CLOSED REDUCTION FINGER WITH PERCUTANEOUS PINNING Left 04/22/2014   Procedure: LEFT SMALL FINGER CLOSED REDUCTION PINNING;  Surgeon: Shellie Dials, MD;  Location: [facility];  Service: Orthopedics;  Laterality: Left;   COLONOSCOPY  11/09/2002   DILATION AND CURETTAGE OF UTERUS  1977   LAPAROSCOPIC CHOLECYSTECTOMY  03/18/2000   RECONSTRUCTION OF EYELID Bilateral 08/2022   RIGHT/LEFT HEART CATH AND CORONARY ANGIOGRAPHY Bilateral 11/25/2023   Procedure: RIGHT/LEFT HEART CATH AND CORONARY ANGIOGRAPHY;  Surgeon: Antonette Batters, MD;  Location: [facility] IN[facility];  Service: Cardiovascular;  Laterality: Bilateral;   TEE WITHOUT CARDIOVERSION N/A 01/10/2024  Procedure: TRANSESOPHAGEAL ECHOCARDIOGRAM (TEE);  Surgeon: Dorita Garter, MD;  Location: [facility];  Service: Cardiovascular;  Laterality: N/A;   TRANSTHORACIC ECHOCARDIOGRAM  02/23/2011   normal lvf/  ef 58%/  mild rve/   mild lae/  mild  mr  & tr/  mild pulmonary htn   VULVECTOMY N/A 12/25/2022   Procedure: PARTIAL  VULVECTOMY;  Surgeon: Derrel Flies, MD;  Location: [facility];  Service: Gynecology;  Laterality: N/A;   VULVECTOMY N/A 02/12/2023   Procedure: WIDE LOCAL EXCISION OF VULVA AND REMOVAL OF CLITORIS;  Surgeon: Derrel Flies, MD;  Location: [facility] [place];  Service: Gynecology;  Laterality: N/A;   VULVECTOMY N/A 02/11/2024   Procedure: WIDE EXCISION VULVECTOMY;  Surgeon: Derrel Flies, MD;  Location: [facility];  Service: Gynecology;  Laterality: N/A;   WISDOM TOOTH EXTRACTION     Family History  Problem Relation Age of Onset   Stroke Mother    Heart disease Mother    Hypertension Father    Diabetes Father    Hyperlipidemia Father    Breast cancer Sister    Breast cancer Sister     Diabetes Brother    Colon cancer Neg Hx    Ovarian cancer Neg Hx    Endometrial cancer Neg Hx    Pancreatic cancer Neg Hx    Prostate cancer Neg Hx    Social History   Socioeconomic History   Marital status: Married    Spouse name: Not on file   Number of children: 1   Years of education: Not on file   Highest education level: Bachelor's degree (e.g., BA, AB, BS)  Occupational History   Occupation: Home    Employer: RETIRED  Tobacco Use   Smoking status: Never   Smokeless tobacco: Never  Vaping Use   Vaping status: Never Used  Substance and Sexual Activity   Alcohol use: Yes    Comment: Only on cruises or rarely   Drug use: No   Sexual activity: Not Currently    Birth control/protection: Post-menopausal  Other Topics Concern   Not on file  Social History Narrative   Moved here from [place]   Social Drivers of Health   Financial Resource Strain: Low Risk  (05/24/2024)   Overall Financial Resource Strain (CARDIA)    Difficulty of Paying Living Expenses: Not hard at all  Food Insecurity: No Food Insecurity (05/24/2024)   Hunger Vital Sign    Worried About Running Out of Food in the Last Year: Never true    Ran Out of Food in the Last Year: Never true  Transportation Needs: No Transportation Needs (05/24/2024)   PRAPARE - Administrator, Civil Service (Medical): No    Lack of Transportation (Non-Medical): No  Physical Activity: Sufficiently Active (05/24/2024)   Exercise Vital Sign    Days of Exercise per Week: 3 days    Minutes of Exercise per Session: 50 min  Stress: Stress Concern Present (05/24/2024)   [employer] of Occupational Health - Occupational Stress Questionnaire    Feeling of Stress: To some extent  Social Connections: Socially Integrated (05/24/2024)   Social Connection and Isolation Panel    Frequency of Communication with Friends and Family: More than three times a week    Frequency of Social Gatherings with Friends and Family: Once a week     Attends Religious Services: More than 4 times per year    Active Member of [employer] or Organizations: Yes    Attends Club or  Organization Meetings: More than 4 times per year    Marital Status: Married    Tobacco Counseling Counseling given: Not Answered    Clinical Intake:  Pre-visit preparation completed: Yes  Pain : 0-10 Pain Score: 5  Pain Type: Acute pain Pain Location: Knee Pain Orientation: Right Pain Descriptors / Indicators: Aching Pain Onset: 1 to 4 weeks ago Pain Frequency: Intermittent Pain Relieving Factors: rest,Tylenol , ice, voltaren  gel Effect of Pain on Daily Activities: slowed down activities  Pain Relieving Factors: rest,Tylenol , ice, voltaren  gel  BMI - recorded: 33.27 Nutritional Status: BMI > 30  Obese Nutritional Risks: None Diabetes: No  Lab Results  Component Value Date   HGBA1C 5.7 (H) 05/25/2024   HGBA1C 5.7 (A) 12/16/2023   HGBA1C 5.7 05/27/2023     How often do you need to have someone help you when you read instructions, pamphlets, or other written materials from your doctor or pharmacy?: 1 - Never  Interpreter Needed?: No  Comments: lives with husband Information entered by :: B.Vincent Ehrler,LPN   Activities of Daily Living     05/24/2024    1:28 PM 01/31/2024   11:16 AM  In your present state of health, do you have any difficulty performing the following activities:  Hearing? 1   Vision? 0   Difficulty concentrating or making decisions? 0   Walking or climbing stairs? 1   Dressing or bathing? 0   Doing errands, shopping? 0 0  Preparing Food and eating ? N   Using the Toilet? N   In the past six months, have you accidently leaked urine? N   Do you have problems with loss of bowel control? N   Managing your Medications? N   Managing your Finances? N   Housekeeping or managing your Housekeeping? N     Patient Care Team: Tower, Manley Seeds, MD as PCP - General (Family Medicine) Abel Abelson, OD as Consulting Physician  (Optometry)  I have updated your Care Teams any recent Medical Services you may have received from other providers in the past year.     Assessment:   This is a routine wellness examination for Briteny.  Hearing/Vision screen Hearing Screening - Comments:: Pt says her hearing is good w/hearing aids Vision Screening - Comments:: Pt says her vision is good;readers only [facility]-Dr Cleora Daft   Goals Addressed             This Visit's Progress    Patient Stated   On track    05/27/24- I will continue to take medications as prescribed.      Patient Stated       05/27/24-, I will continue to do Silver Sneakers 3 days a week for 45 minutes.      Patient Stated       05/27/24, I will continue to do cardio and weight training for 45 minutes 3 days a week.  Active Older Adults Class       Depression Screen     05/27/2024    1:12 PM 12/16/2023    3:26 PM 10/29/2023    9:57 AM 09/09/2023   11:31 AM 09/03/2023    9:45 AM 07/05/2023   12:25 PM 07/02/2023    8:12 AM  PHQ 2/9 Scores  PHQ - 2 Score 0 6 4 0 2 4 1   PHQ- 9 Score  17 12 3 3 9 1     Fall Risk     05/24/2024    1:28 PM 10/29/2023    9:56 AM  09/09/2023   11:31 AM 09/03/2023    9:45 AM 07/05/2023   12:25 PM  Fall Risk   Falls in the past year? 0 1 1 1 1   Number falls in past yr:  0 1 1 0  Injury with Fall?  1 1 0 1  Risk for fall due to : No Fall Risks History of fall(s) History of fall(s) No Fall Risks No Fall Risks  Follow up Falls prevention discussed;Education provided Falls evaluation completed Falls evaluation completed Falls evaluation completed Falls evaluation completed    MEDICARE RISK AT HOME:  Medicare Risk at Home Any stairs in or around the home?: (Patient-Rptd) Yes If so, are there any without handrails?: (Patient-Rptd) No Home free of loose throw rugs in walkways, pet beds, electrical cords, etc?: (Patient-Rptd) Yes Adequate lighting in your home to reduce risk of falls?: (Patient-Rptd) Yes Life alert?:  (Patient-Rptd) Yes Use of a cane, walker or w/c?: (Patient-Rptd) No Grab bars in the bathroom?: (Patient-Rptd) No Shower chair or bench in shower?: (Patient-Rptd) No Elevated toilet seat or a handicapped toilet?: (Patient-Rptd) Yes  TIMED UP AND GO:  Was the test performed?  No  Cognitive Function: 6CIT completed    05/17/2021   11:19 AM 05/16/2020   11:19 AM 05/08/2019   12:13 PM 02/24/2018   10:12 AM 02/20/2017   10:09 AM  MMSE - Mini Mental State Exam  Orientation to time 5 5 5 5 5    Orientation to Place 5 5 5 5 5    Registration 3 3 3 3 3    Attention/ Calculation 5 5 0 0 0   Recall 3 3 3 3 2    Recall-comments     pt was unable to recall 1 of 3 words   Language- name 2 objects   0 0 0   Language- repeat 1 1 1 1 1   Language- follow 3 step command   0 3 3   Language- read & follow direction   0 0 0   Write a sentence   0 0 0   Copy design   0 0 0   Total score   17 20 19       Data saved with a previous flowsheet row definition        05/27/2024    1:15 PM 05/27/2023   11:45 AM 05/25/2022   12:02 PM  6CIT Screen  What Year? 0 points 0 points 0 points  What month? 0 points 0 points 0 points  What time? 0 points 0 points 0 points  Count back from 20 0 points 0 points 0 points  Months in reverse 0 points 0 points 0 points  Repeat phrase 0 points 0 points 0 points  Total Score 0 points 0 points 0 points    Immunizations Immunization History  Administered Date(s) Administered   Influenza Inj Mdck Quad Pf 08/19/2022   Influenza Split 09/16/2012   Influenza Whole 10/01/2006, 09/14/2008, 09/07/2009, 09/04/2010, 08/21/2011   Influenza, High Dose Seasonal PF 08/16/2023   Influenza, Seasonal, Injecte, Preservative Fre 09/13/2016   Influenza,inj,Quad PF,6+ Mos 08/26/2017, 08/25/2018, 08/07/2019, 08/05/2020   Influenza-Unspecified 09/08/2013, 08/10/2014, 08/25/2015   PFIZER(Purple Top)SARS-COV-2 Vaccination 02/27/2020, 03/23/2020   PNEUMOCOCCAL CONJUGATE-20 05/30/2023    Pneumococcal Polysaccharide-23 08/15/2006, 10/16/2013   Td 12/10/1998, 09/30/2009   Tdap 08/29/2017   Zoster Recombinant(Shingrix) 05/22/2021, 07/24/2021   Zoster, Live 04/13/2011    Screening Tests Health Maintenance  Topic Date Due   COVID-19 Vaccine (3 - Pfizer risk series) 09/24/2024 (Originally 04/20/2020)  INFLUENZA VACCINE  07/10/2024   MAMMOGRAM  11/17/2024   Colonoscopy  05/19/2025   Medicare Annual Wellness (AWV)  05/27/2025   DTaP/Tdap/Td (4 - Td or Tdap) 08/30/2027   Pneumococcal Vaccine: 50+ Years  Completed   DEXA SCAN  Completed   Hepatitis C Screening  Completed   Zoster Vaccines- Shingrix  Completed   HPV VACCINES  Aged Out   Meningococcal B Vaccine  Aged Out    Health Maintenance  There are no preventive care reminders to display for this patient.  Health Maintenance Items Addressed: None needed at this time  Additional Screening:  Vision Screening: Recommended annual ophthalmology exams for early detection of glaucoma and other disorders of the eye. Would you like a referral to an eye doctor? No    Dental Screening: Recommended annual dental exams for proper oral hygiene  Community Resource Referral / Chronic Care Management: CRR required this visit?  No   CCM required this visit?  No   Plan:    I have personally reviewed and noted the following in the patient's chart:   Medical and social history Use of alcohol, tobacco or illicit drugs  Current medications and supplements including opioid prescriptions. Patient is not currently taking opioid prescriptions. Functional ability and status Nutritional status Physical activity Advanced directives List of other physicians Hospitalizations, surgeries, and ER visits in previous 12 months Vitals Screenings to include cognitive, depression, and falls Referrals and appointments  In addition, I have reviewed and discussed with patient certain preventive protocols, quality metrics, and best  practice recommendations. A written personalized care plan for preventive services as well as general preventive health recommendations were provided to patient.   Nerissa Bannister, LPN   03/18/8118   After Visit Summary: (MyChart) Due to this being a telephonic visit, the after visit summary with patients personalized plan was offered to patient via MyChart   Notes: Nothing significant to report at this time.

## 2024-05-27 NOTE — Patient Instructions (Signed)
 [name] [name] , Thank you for taking time out of your busy schedule to complete your Annual Wellness Visit with me. I enjoyed our conversation and look forward to speaking with you again next year. I, as well as your care team,  appreciate your ongoing commitment to your health goals. Please review the following plan we discussed and let me know if I can assist you in the future. Your Game plan/ To Do List    Follow up Visits: Next Medicare AWV with our clinical staff: 05/28/25 @ 1pm televisit   Have you seen your provider in the last 6 months (3 months if uncontrolled diabetes)? Yes Next Office Visit with your provider: 06/01/24  Clinician Recommendations:  Aim for 30 minutes of exercise or brisk walking, 6-8 glasses of water, and 5 servings of fruits and vegetables each day.       This is a list of the screening recommended for you and due dates:  Health Maintenance  Topic Date Due   COVID-19 Vaccine (3 - Pfizer risk series) 09/24/2024*   Flu Shot  07/10/2024   Mammogram  11/17/2024   Colon Cancer Screening  05/19/2025   Medicare Annual Wellness Visit  05/27/2025   DTaP/Tdap/Td vaccine (4 - Td or Tdap) 08/30/2027   Pneumococcal Vaccine for age over 17  Completed   DEXA scan (bone density measurement)  Completed   Hepatitis C Screening  Completed   Zoster (Shingles) Vaccine  Completed   HPV Vaccine  Aged Out   Meningitis B Vaccine  Aged Out  *Topic was postponed. The date shown is not the original due date.    Advanced directives: (In Chart) A copy of your advanced directives are scanned into your chart should your provider ever need it. Advance Care Planning is important because it:  [x]  Makes sure you receive the medical care that is consistent with your values, goals, and preferences  [x]  It provides guidance to your family and loved ones and reduces their decisional burden about whether or not they are making the right decisions based on your wishes.  Follow the link provided  in your after visit summary or read over the paperwork we have mailed to you to help you started getting your Advance Directives in place. If you need assistance in completing these, please reach out to us  so that we can help you!

## 2024-06-01 ENCOUNTER — Ambulatory Visit: Payer: Medicare HMO | Admitting: Family Medicine

## 2024-06-01 ENCOUNTER — Encounter: Payer: Self-pay | Admitting: Family Medicine

## 2024-06-01 VITALS — BP 126/72 | HR 52 | Temp 98.3°F | Ht 60.0 in | Wt 180.2 lb

## 2024-06-01 DIAGNOSIS — K219 Gastro-esophageal reflux disease without esophagitis: Secondary | ICD-10-CM

## 2024-06-01 DIAGNOSIS — H9193 Unspecified hearing loss, bilateral: Secondary | ICD-10-CM | POA: Diagnosis not present

## 2024-06-01 DIAGNOSIS — N1831 Chronic kidney disease, stage 3a: Secondary | ICD-10-CM

## 2024-06-01 DIAGNOSIS — M25561 Pain in right knee: Secondary | ICD-10-CM | POA: Diagnosis not present

## 2024-06-01 DIAGNOSIS — F3177 Bipolar disorder, in partial remission, most recent episode mixed: Secondary | ICD-10-CM | POA: Diagnosis not present

## 2024-06-01 DIAGNOSIS — E039 Hypothyroidism, unspecified: Secondary | ICD-10-CM

## 2024-06-01 DIAGNOSIS — Z6835 Body mass index (BMI) 35.0-35.9, adult: Secondary | ICD-10-CM

## 2024-06-01 DIAGNOSIS — I272 Pulmonary hypertension, unspecified: Secondary | ICD-10-CM

## 2024-06-01 DIAGNOSIS — E7800 Pure hypercholesterolemia, unspecified: Secondary | ICD-10-CM | POA: Diagnosis not present

## 2024-06-01 DIAGNOSIS — Z1211 Encounter for screening for malignant neoplasm of colon: Secondary | ICD-10-CM

## 2024-06-01 DIAGNOSIS — R7303 Prediabetes: Secondary | ICD-10-CM

## 2024-06-01 DIAGNOSIS — M8580 Other specified disorders of bone density and structure, unspecified site: Secondary | ICD-10-CM

## 2024-06-01 DIAGNOSIS — E66812 Obesity, class 2: Secondary | ICD-10-CM

## 2024-06-01 DIAGNOSIS — I480 Paroxysmal atrial fibrillation: Secondary | ICD-10-CM

## 2024-06-01 DIAGNOSIS — D649 Anemia, unspecified: Secondary | ICD-10-CM

## 2024-06-01 DIAGNOSIS — Z0000 Encounter for general adult medical examination without abnormal findings: Secondary | ICD-10-CM | POA: Diagnosis not present

## 2024-06-01 MED ORDER — FLUTICASONE PROPIONATE 50 MCG/ACT NA SUSP: 2.0000 | Freq: Every day | NASAL | 3 refills | Status: AC

## 2024-06-01 NOTE — Assessment & Plan Note (Signed)
 Pepcid  20 mg bid   Improved with weight loss

## 2024-06-01 NOTE — Assessment & Plan Note (Signed)
 Pt takes lisinopril  and jardiance Breathing is much better with weight loss

## 2024-06-01 NOTE — Assessment & Plan Note (Signed)
 Disc goals for lipids and reasons to control them Rev last labs with pt Rev low sat fat diet in detail   LDL of 76 HDL is up   Lovastatin  40  Better diet

## 2024-06-01 NOTE — Assessment & Plan Note (Signed)
 GFR 51 Great water intake On jardiance 10

## 2024-06-01 NOTE — Assessment & Plan Note (Signed)
 Dexa 07/2023 No falls or fracture Discussed fall prevention, supplements and exercise for bone density

## 2024-06-01 NOTE — Patient Instructions (Addendum)
 Avoid moves hurt your knee   Stop at check out for appointment with Dr Watt   Use ice on it as well   Keep working on weight loss   I do not feel a lump under you arm If what you feel is not gone in a month let us  know , we may do early imaging   Labs are stable to improved   Take care of yourself

## 2024-06-01 NOTE — Assessment & Plan Note (Signed)
 Lab Results  Component Value Date   HGBA1C 5.7 (H) 05/25/2024   Unchanged disc imp of low glycemic diet and wt loss to prevent DM2   Doing well with weight loss

## 2024-06-01 NOTE — Progress Notes (Signed)
 Subjective:    Patient ID: [name] [name], female    DOB: [birth year], 68 y.o.   MRN: [id number]  HPI  Here for health maintenance exam and to review chronic medical problems   Wt Readings from Last 3 Encounters:  06/01/24 180 lb 4 oz (81.8 kg)  05/27/24 179 lb (81.2 kg)  04/02/24 191 lb 6 oz (86.8 kg)   35.20 kg/m  Vitals:   06/01/24 1358  BP: 126/72  Pulse: (!) 52  Temp: 98.3 F (36.8 C)  SpO2: 96%    Immunization History  Administered Date(s) Administered   Influenza Inj Mdck Quad Pf 08/19/2022   Influenza Split 09/16/2012   Influenza Whole 10/01/2006, 09/14/2008, 09/07/2009, 09/04/2010, 08/21/2011   Influenza, High Dose Seasonal PF 08/16/2023   Influenza, Seasonal, Injecte, Preservative Fre 09/13/2016   Influenza,inj,Quad PF,6+ Mos 08/26/2017, 08/25/2018, 08/07/2019, 08/05/2020   Influenza-Unspecified 09/08/2013, 08/10/2014, 08/25/2015   PFIZER(Purple Top)SARS-COV-2 Vaccination 02/27/2020, 03/23/2020   PNEUMOCOCCAL CONJUGATE-20 05/30/2023   Pneumococcal Polysaccharide-23 08/15/2006, 10/16/2013   Td 12/10/1998, 09/30/2009   Tdap 08/29/2017   Zoster Recombinant(Shingrix) 05/22/2021, 07/24/2021   Zoster, Live 04/13/2011    There are no preventive care reminders to display for this patient.  Working hard on weight loss  Weight down to 178 lb at home   [facility]  Using diclofenac  gel     Mammogram 12 /2024  Self breast exam no lumps  Has fullness under her left arm however   [facility] with neg HPV 05/2022  Will be due for pap in a year   Colon cancer screening  colonoscopy 05/2015    Bone health  Dexa 07/2023 osteopenia  Falls- none  Fractures-none  Supplements  Last vitamin D  Lab Results  Component Value Date   VD25OH 75 05/25/2024    [facility]   Dermatology care   Due for visit in October  Has new spot on left leg    Mood    06/01/2024    2:02 PM 05/27/2024    1:12 PM 12/16/2023    3:26 PM  10/29/2023    9:57 AM 09/09/2023   11:31 AM  Depression screen PHQ 2/9  Decreased Interest 1 0 3 2 0  Down, Depressed, Hopeless 0 0 3 2 0  PHQ - 2 Score 1 0 6 4 0  Altered sleeping 0  1 0 0  Tired, decreased energy 0  2 3 1   Change in appetite 0  2 0 0  Feeling bad or failure about yourself  2  2 1 1   Trouble concentrating 2  2 1 1   Moving slowly or fidgety/restless   2 3 0  Suicidal thoughts 0  0 0 0  PHQ-9 Score 5  17 12 3   Difficult doing work/chores Not difficult at all   Somewhat difficult Not difficult at all   Bipolar  Under care of psychiatry  Lamictal   Abilify   Overall doing very well     History of a fib and NSTEMi  BP Readings from Last 3 Encounters:  06/01/24 126/72  04/02/24 128/84  03/26/24 130/74   Pulse Readings from Last 3 Encounters:  06/01/24 (!) 52  04/02/24 (!) 55  03/26/24 69   Taking eliquis Lisiniopril 2.5 mg daily  Jardiance 10 mg daily  Torsemide 20 mg daily for leg swelling   In and out of a fib - has ablation planned Breathing is much better   Lab Results  Component Value Date   NA 140 05/25/2024   K 4.4 05/25/2024   CO2 28 05/25/2024   GLUCOSE 97 05/25/2024   BUN 18 05/25/2024   CREATININE 1.18 (H) 05/25/2024   CALCIUM 9.6 05/25/2024   GFR 54.46 (L) 10/29/2023   EGFR 51 (L) 05/25/2024   GFRNONAA 46 (L) 01/31/2024   Drinking 80-90 oz fluid per day with weight watchers    Hyperlipidemia Lab Results  Component Value Date   CHOL 164 05/25/2024   CHOL 111 10/23/2023   CHOL 190 05/27/2023   Lab Results  Component Value Date   HDL 65 05/25/2024   HDL 55 10/23/2023   HDL 54.00 05/27/2023   Lab Results  Component Value Date   LDLCALC 76 05/25/2024   LDLCALC 43 10/23/2023   LDLCALC 103 (H) 05/27/2023   Lab Results  Component Value Date   TRIG 147 05/25/2024   TRIG 66 10/23/2023   TRIG 167.0 (H) 05/27/2023   Lab Results  Component Value Date   CHOLHDL 2.5 05/25/2024   CHOLHDL 2.0 10/23/2023   CHOLHDL 4  05/27/2023   No results found for: LDLDIRECT   Lovastatin  40 mg daily   Prediabetes Lab Results  Component Value Date   HGBA1C 5.7 (H) 05/25/2024   HGBA1C 5.7 (A) 12/16/2023   HGBA1C 5.7 05/27/2023   Diet is really good    GERD Takes famotidine  20 mg bid   Hypothyroidism  Pt has no clinical changes No change in energy level/ hair or skin/ edema and no tremor Lab Results  Component Value Date   TSH 2.20 05/25/2024    Levothyroxine  50 mcg daily    Lab Results  Component Value Date   WBC 6.8 05/25/2024   HGB 13.2 05/25/2024   HCT 41.3 05/25/2024   MCV 90.6 05/25/2024   PLT 297 05/25/2024   Lab Results  Component Value Date   ALT 11 05/25/2024   AST 16 05/25/2024   ALKPHOS 69 06/29/2023   BILITOT 0.4 05/25/2024     Patient Active Problem List   Diagnosis Date Noted   Knee pain, right 06/01/2024   Rosacea 04/02/2024   Pulmonary hypertension, unspecified (HCC) 12/31/2023   Edema of lower extremity 12/31/2023   Chronic kidney disease, stage 3a (HCC) 12/31/2023   NSTEMI (non-ST elevated myocardial infarction) (HCC) 10/22/2023   S/P ablation of atrial fibrillation 09/30/2023   Chronic anticoagulation 09/30/2023   Pain of left hip 09/11/2023   History of anal fissures 09/09/2023   Osteopenia 07/30/2023   Headache 07/05/2023   Paroxysmal atrial fibrillation (HCC) 06/27/2023   Bradycardia 05/30/2023   Melanoma in situ (HCC) 12/25/2022   ASCUS of cervix with negative high risk HPV 06/25/2022   Osteoarthritis of knee 05/25/2022   Hemorrhoid 04/25/2022   Estrogen deficiency 01/02/2022   Bipolar disorder, in partial remission, most recent episode mixed (HCC) 02/25/2020   Dyspnea 01/19/2020   Prediabetes 03/03/2018   Atrophic vaginitis 07/08/2017   Cervical stenosis (uterine cervix) 01/03/2017   Hearing loss 10/25/2015   Routine general medical examination at a health care facility 10/17/2015   Facial rash 02/22/2015   Colon cancer screening 10/16/2013    Enthesopathy of ankle and tarsus 12/01/2009   Enthesopathy of ankle and tarsus 12/01/2009   Asymptomatic postmenopausal status 09/30/2009   Arthropathy of pelvic region and thigh 03/10/2009   DEGENERATIVE DISC DISEASE, LUMBAR SPINE 03/10/2009   DDD (degenerative disc disease), lumbosacral 03/10/2009   ONYCHOMYCOSIS 09/17/2008   Hypothyroidism 08/05/2007   Class 2 severe obesity  due to excess calories with serious comorbidity and body mass index (BMI) of 35.0 to 35.9 in adult [facility]) 08/05/2007   Bipolar disorder (HCC) 08/05/2007   Allergic rhinitis 08/05/2007   Irritable bowel syndrome 08/05/2007   Esophageal reflux 08/05/2007   HYPERCHOLESTEROLEMIA, PURE 07/07/2007   Past Medical History:  Diagnosis Date   Actinic keratosis 03/29/2021   R central upper lip   Allergic rhinitis    Allergy    Anemia 10/29/2023   Anxiety    Arthritis    Atypical mole 12/26/2021   spinal upper back, exc 01/15/22   Basal cell carcinoma 10/16/2010   R lower pretibia    Bipolar 1 disorder (HCC)    Chronic kidney disease    Chronic kidney disease    Depression    Dyspnea    Fissure, anal    GERD (gastroesophageal reflux disease)    History of basal cell carcinoma excision    ON FACE   History of squamous cell carcinoma excision    ON RIGHT LEG   Hyperlipidemia    Hypothyroidism    IBS (irritable bowel syndrome)    Liver cyst    Malignant melanoma in situ (HCC) 11/20/2022   Right labia minora. Excision 12/25/2022 at anterior vulva, left of clitoral hood, margins involved. Surgery scheduled for 02/12/2023. Recurrence 12/23/2023, excised 02/11/2024, margins clear.   Mallet finger of left hand    small finger   NSTEMI (non-ST elevated myocardial infarction) (HCC) 10/22/2023   Pneumonia    Pre-diabetes    Past Surgical History:  Procedure Laterality Date   ABLATION     CARDIAC CATHETERIZATION  04/10/1999   per pt normal   CARDIOVASCULAR STRESS TEST  02-23-2011  dr vinie fath [facility] clinic)    normal nuclear study/  no ischemia/  ef 64%   CARDIOVERSION N/A 01/10/2024   Procedure: CARDIOVERSION;  Surgeon: Hilarie Rocher, MD;  Location: [facility] ORS;  Service: Cardiovascular;  Laterality: N/A;   CATARACT EXTRACTION W/ INTRAOCULAR LENS  IMPLANT, BILATERAL  12/10/2001   Bil   CESAREAN SECTION  12/10/1982   CLOSED REDUCTION FINGER WITH PERCUTANEOUS PINNING Left 04/22/2014   Procedure: LEFT SMALL FINGER CLOSED REDUCTION PINNING;  Surgeon: Prentice LELON Pagan, MD;  Location: [facility] [place];  Service: Orthopedics;  Laterality: Left;   COLONOSCOPY  11/09/2002   DILATION AND CURETTAGE OF UTERUS  1977   LAPAROSCOPIC CHOLECYSTECTOMY  03/18/2000   RECONSTRUCTION OF EYELID Bilateral 08/2022   RIGHT/LEFT HEART CATH AND CORONARY ANGIOGRAPHY Bilateral 11/25/2023   Procedure: RIGHT/LEFT HEART CATH AND CORONARY ANGIOGRAPHY;  Surgeon: Florencio Cara BIRCH, MD;  Location: [facility] INVASIVE CV LAB;  Service: Cardiovascular;  Laterality: Bilateral;   TEE WITHOUT CARDIOVERSION N/A 01/10/2024   Procedure: TRANSESOPHAGEAL ECHOCARDIOGRAM (TEE);  Surgeon: Hilarie Rocher, MD;  Location: [facility] OR[facility];  Service: Cardiovascular;  Laterality: N/A;   TRANSTHORACIC ECHOCARDIOGRAM  02/23/2011   normal lvf/  ef 58%/  mild rve/   mild lae/  mild  mr  & tr/  mild pulmonary htn   VULVECTOMY N/A 12/25/2022   Procedure: PARTIAL  VULVECTOMY;  Surgeon: Eldonna Mays, MD;  Location: [facility] [place];  Service: Gynecology;  Laterality: N/A;   VULVECTOMY N/A 02/12/2023   Procedure: WIDE LOCAL EXCISION OF VULVA AND REMOVAL OF CLITORIS;  Surgeon: Eldonna Mays, MD;  Location: [facility] [place];  Service: Gynecology;  Laterality: N/A;   VULVECTOMY N/A 02/11/2024   Procedure: WIDE EXCISION VULVECTOMY;  Surgeon: Eldonna Mays, MD;  Location: [facility];  Service: Gynecology;  Laterality: N/A;   WISDOM TOOTH EXTRACTION     Social History   Tobacco Use   Smoking status: Never   Smokeless tobacco: Never  Vaping  Use   Vaping status: Never Used  Substance Use Topics   Alcohol use: Not Currently    Comment: Only on cruises or rarely   Drug use: No   Family History  Problem Relation Age of Onset   Stroke Mother    Heart disease Mother    Hypertension Father    Diabetes Father    Hyperlipidemia Father    Breast cancer Sister    Cancer Sister    Breast cancer Sister    Diabetes Brother    Alcohol abuse Sister    Arthritis Sister    Mental illness Sister    Colon cancer Neg Hx    Ovarian cancer Neg Hx    Endometrial cancer Neg Hx    Pancreatic cancer Neg Hx    Prostate cancer Neg Hx    Allergies  Allergen Reactions   Ingrezza [Valbenazine Tosylate] Other (See Comments)    Night sweats, worsened tardive dyskinesia, nightmares   Lidocaine  Swelling    ALL CAINE DRUGS EXCEPT: marcaine  & sensercaine    Xylocaine  [Lidocaine  Hcl] Swelling    ALLERGIC TO ALL CAINES EXCEPT SENSORCAINE  , AND MARCAINE    Albuterol Other (See Comments)    Leg swelling   Amantadine     Muscle pain and tremors    Austedo Xr [Deutetrabenazine]     Caused Muscle aches and tremors   Codeine Nausea And Vomiting    Vomits blood   Lithium Swelling   Singulair [Montelukast] Swelling    Leg swelling   Tegretol [Carbamazepine] Other (See Comments)    skin crawls   Current Outpatient Medications on File Prior to Visit  Medication Sig Dispense Refill   acetaminophen  (TYLENOL ) 650 MG CR tablet Take 1,300 mg by mouth every 8 (eight) hours as needed for pain.     apixaban (ELIQUIS) 5 MG TABS tablet Take 5 mg by mouth 2 (two) times daily.     ARIPiprazole  (ABILIFY ) 10 MG tablet Take 10 mg by mouth at bedtime.     Azelaic Acid  15 % gel Apply once or twice daily to face 50 g 5   Biotin 5000 MCG CAPS Take 5,000 mcg by mouth daily.     buPROPion  (WELLBUTRIN  XL) 300 MG 24 hr tablet Take 300 mg by mouth daily.     cetirizine (ZYRTEC ALLERGY) 10 MG tablet Take 10 mg by mouth daily.     cholecalciferol (VITAMIN D3) 25 MCG  (1000 UNIT) tablet Take 1,000 Units by mouth daily.     clonazePAM  (KLONOPIN ) 1 MG tablet Take 1 mg by mouth at bedtime.     Coenzyme Q10 (COQ10) 100 MG CAPS Take 100 mg by mouth at bedtime.     famotidine  (PEPCID ) 20 MG tablet TAKE 1 TABLET TWICE DAILY 180 tablet 1   JARDIANCE 10 MG TABS tablet Take 10 mg by mouth daily.     lamoTRIgine  (LAMICTAL ) 200 MG tablet Take 200 mg by mouth at bedtime.     levothyroxine  (SYNTHROID ) 50 MCG tablet TAKE 1 TABLET EVERY DAY BEFORE BREAKFAST 90 tablet 1   lisinopril  (ZESTRIL ) 2.5 MG tablet Take 1 tablet (2.5 mg total) by mouth daily. 30 tablet 0   lovastatin  (MEVACOR ) 40 MG tablet TAKE 1 TABLET EVERY DAY 90 tablet 1   nystatin  (MYCOSTATIN /NYSTOP ) powder Apply 1 Application topically 3 (three) times  daily. Under abdomen skin fold 15 g 2   tacrolimus  (PROTOPIC ) 0.1 % ointment APPLY 1 FILM TOPICALLY IN THE MORNING AND AT BEDTIME. 30 g 0   torsemide (DEMADEX) 20 MG tablet Take 20 mg by mouth daily.     zolpidem  (AMBIEN ) 10 MG tablet Take 10 mg by mouth at bedtime.     No current facility-administered medications on file prior to visit.    Review of Systems  Constitutional:  Negative for activity change, appetite change, fatigue, fever and unexpected weight change.  HENT:  Negative for congestion, ear pain, rhinorrhea, sinus pressure and sore throat.   Eyes:  Negative for pain, redness and visual disturbance.  Respiratory:  Negative for cough, shortness of breath and wheezing.   Cardiovascular:  Negative for chest pain and palpitations.  Gastrointestinal:  Negative for abdominal pain, blood in stool, constipation and diarrhea.  Endocrine: Negative for polydipsia and polyuria.  Genitourinary:  Negative for dysuria, frequency and urgency.  Musculoskeletal:  Negative for arthralgias, back pain and myalgias.  Skin:  Negative for pallor and rash.  Allergic/Immunologic: Negative for environmental allergies.  Neurological:  Negative for dizziness, syncope and  headaches.  Hematological:  Negative for adenopathy. Does not bruise/bleed easily.  Psychiatric/Behavioral:  Negative for decreased concentration and dysphoric mood. The patient is not nervous/anxious.        Objective:   Physical Exam Constitutional:      General: She is not in acute distress.    Appearance: Normal appearance. She is well-developed. She is obese. She is not ill-appearing or diaphoretic.  HENT:     Head: Normocephalic and atraumatic.     Right Ear: Tympanic membrane, ear canal and external ear normal.     Left Ear: Tympanic membrane, ear canal and external ear normal.     Nose: Nose normal. No congestion.     Mouth/Throat:     Mouth: Mucous membranes are moist.     Pharynx: Oropharynx is clear. No posterior oropharyngeal erythema.   Eyes:     General: No scleral icterus.    Extraocular Movements: Extraocular movements intact.     Conjunctiva/sclera: Conjunctivae normal.     Pupils: Pupils are equal, round, and reactive to light.   Neck:     Thyroid : No thyromegaly.     Vascular: No carotid bruit or JVD.   Cardiovascular:     Rate and Rhythm: Normal rate and regular rhythm.     Pulses: Normal pulses.     Heart sounds: Normal heart sounds.     No gallop.  Pulmonary:     Effort: Pulmonary effort is normal. No respiratory distress.     Breath sounds: Normal breath sounds. No wheezing.     Comments: Good air exch Chest:     Chest wall: No tenderness.  Abdominal:     General: Bowel sounds are normal. There is no distension or abdominal bruit.     Palpations: Abdomen is soft. There is no mass.     Tenderness: There is no abdominal tenderness.     Hernia: No hernia is present.  Genitourinary:    Comments: Breast exam: No mass, nodules, thickening, tenderness, bulging, retraction, inflamation, nipple discharge or skin changes noted.  No axillary or clavicular LA.      No lump or thickening noted in left axilla    Musculoskeletal:        General: No  tenderness. Normal range of motion.     Cervical back: Normal range of motion and neck  supple. No rigidity. No muscular tenderness.     Right lower leg: No edema.     Left lower leg: No edema.     Comments: No kyphosis   Lymphadenopathy:     Cervical: No cervical adenopathy.   Skin:    General: Skin is warm and dry.     Coloration: Skin is not pale.     Findings: No erythema or rash.     Comments: Solar lentigines diffusely  Some sks  Sk /round on left inner thigh   Neurological:     Mental Status: She is alert. Mental status is at baseline.     Cranial Nerves: No cranial nerve deficit.     Motor: No abnormal muscle tone.     Coordination: Coordination normal.     Gait: Gait normal.     Deep Tendon Reflexes: Reflexes are normal and symmetric. Reflexes normal.   Psychiatric:        Mood and Affect: Mood normal.        Cognition and Memory: Cognition and memory normal.           Assessment & Plan:   Problem List Items Addressed This Visit       Cardiovascular and Mediastinum   Pulmonary hypertension, unspecified (HCC)   Pt takes lisinopril  and jardiance Breathing is much better with weight loss        Paroxysmal atrial fibrillation (HCC)   Pulse in low 50s today Planning another ablation Is in and out of a fib  No longer on flecainide   Reviewed last cardiology note           Digestive   Esophageal reflux   Pepcid  20 mg bid   Improved with weight loss        Endocrine   Hypothyroidism   Hypothyroidism  Pt has no clinical changes No change in energy level/ hair or skin/ edema and no tremor Lab Results  Component Value Date   TSH 2.20 05/25/2024    Plan to continue levothyroxine  50 mcg daily          Nervous and Auditory   Hearing loss   Has hearing aides Did not wear them today        Musculoskeletal and Integument   Osteopenia   Dexa 07/2023 No falls or fracture Discussed fall prevention, supplements and exercise for bone  density          Genitourinary   Chronic kidney disease, stage 3a (HCC)   GFR 51 Great water intake On jardiance 10         Other   Routine general medical examination at a health care facility - Primary   Reviewed health habits including diet and exercise and skin cancer prevention Reviewed appropriate screening tests for age  Also reviewed health mt list, fam hx and immunization status , as well as social and family history   See HPI Labs reviewed and ordered Health Maintenance  Topic Date Due   COVID-19 Vaccine (3 - Pfizer risk series) 09/24/2024*   Flu Shot  07/10/2024   Mammogram  11/17/2024   Colon Cancer Screening  05/19/2025   Medicare Annual Wellness Visit  05/27/2025   DTaP/Tdap/Td vaccine (4 - Td or Tdap) 08/30/2027   Pneumococcal Vaccine for age over 66  Completed   DEXA scan (bone density measurement)  Completed   Hepatitis C Screening  Completed   Zoster (Shingles) Vaccine  Completed   HPV Vaccine  Aged Out   Meningitis  B Vaccine  Aged Out  *Topic was postponed. The date shown is not the original due date.    Mammogram due in dec , pt feels a fullness in left axilla, I cannot appreciate anything , instructed to call if not gone and a month and we can order more imaging  Colonoscopy due in a year  Commended weight loss with weight watchers  Discussed fall prevention, supplements and exercise for bone density  Dermatology care utd  PHQ 0 under care of psychiatry       Prediabetes   Lab Results  Component Value Date   HGBA1C 5.7 (H) 05/25/2024   Unchanged disc imp of low glycemic diet and wt loss to prevent DM2   Doing well with weight loss       Knee pain, right   [facility]  Anterior  No swelling Will schedule sport med appointment       HYPERCHOLESTEROLEMIA, PURE   Disc goals for lipids and reasons to control them Rev last labs with pt Rev low sat fat diet in detail   LDL of 76 HDL is up   Lovastatin  40   Better diet       Colon cancer screening   Class 2 severe obesity due to excess calories with serious comorbidity and body mass index (BMI) of 35.0 to 35.9 in adult [facility])   Still losing with weight watchers program Discussed how this problem influences overall health and the risks it imposes  Reviewed plan for weight loss with lower calorie diet (via better food choices (lower glycemic and portion control) along with exercise building up to or more than 30 minutes 5 days per week including some aerobic activity and strength training    Commended       Bipolar disorder (HCC)   Continues lamictal  and abilify  from psychiatry /Dr Alica      RESOLVED: Anemia

## 2024-06-01 NOTE — Assessment & Plan Note (Signed)
 Has hearing aides Did not wear them today

## 2024-06-01 NOTE — Assessment & Plan Note (Signed)
 Still losing with weight watchers program Discussed how this problem influences overall health and the risks it imposes  Reviewed plan for weight loss with lower calorie diet (via better food choices (lower glycemic and portion control) along with exercise building up to or more than 30 minutes 5 days per week including some aerobic activity and strength training    Commended

## 2024-06-01 NOTE — Assessment & Plan Note (Signed)
 Pulse in low 50s today Planning another ablation Is in and out of a fib  No longer on flecainide   Reviewed last cardiology note

## 2024-06-01 NOTE — Assessment & Plan Note (Signed)
 Reviewed health habits including diet and exercise and skin cancer prevention Reviewed appropriate screening tests for age  Also reviewed health mt list, fam hx and immunization status , as well as social and family history   See HPI Labs reviewed and ordered Health Maintenance  Topic Date Due   COVID-19 Vaccine (3 - Pfizer risk series) 09/24/2024*   Flu Shot  07/10/2024   Mammogram  11/17/2024   Colon Cancer Screening  05/19/2025   Medicare Annual Wellness Visit  05/27/2025   DTaP/Tdap/Td vaccine (4 - Td or Tdap) 08/30/2027   Pneumococcal Vaccine for age over 30  Completed   DEXA scan (bone density measurement)  Completed   Hepatitis C Screening  Completed   Zoster (Shingles) Vaccine  Completed   HPV Vaccine  Aged Out   Meningitis B Vaccine  Aged Out  *Topic was postponed. The date shown is not the original due date.    Mammogram due in dec , pt feels a fullness in left axilla, I cannot appreciate anything , instructed to call if not gone and a month and we can order more imaging  Colonoscopy due in a year  Commended weight loss with weight watchers  Discussed fall prevention, supplements and exercise for bone density  Dermatology care utd  PHQ 0 under care of psychiatry

## 2024-06-01 NOTE — Assessment & Plan Note (Signed)
 Hypothyroidism  Pt has no clinical changes No change in energy level/ hair or skin/ edema and no tremor Lab Results  Component Value Date   TSH 2.20 05/25/2024    Plan to continue levothyroxine  50 mcg daily

## 2024-06-01 NOTE — Assessment & Plan Note (Signed)
 Continues lamictal  and abilify  from psychiatry /Dr Alica

## 2024-06-01 NOTE — Assessment & Plan Note (Signed)
 [facility]  No swelling Will schedule sport med appointment

## 2024-06-08 ENCOUNTER — Inpatient Hospital Stay: Attending: Psychiatry | Admitting: Psychiatry

## 2024-06-08 ENCOUNTER — Encounter: Payer: Self-pay | Admitting: Psychiatry

## 2024-06-08 VITALS — BP 104/62 | HR 97 | Temp 99.2°F | Resp 19 | Wt 179.4 lb

## 2024-06-08 DIAGNOSIS — D038 Melanoma in situ of other sites: Secondary | ICD-10-CM | POA: Diagnosis present

## 2024-06-08 DIAGNOSIS — C519 Malignant neoplasm of vulva, unspecified: Secondary | ICD-10-CM | POA: Diagnosis not present

## 2024-06-08 NOTE — Progress Notes (Signed)
 Gynecologic Oncology Return Clinic Visit  Date of Service: 06/08/2024 Referring Provider: Harland Birkenhead, MD   Assessment & Plan: [name] [name] is a 68 y.o. woman with recurrent vulvar melanoma in situ who is s/p repeat wide local excision of the vulva on 02/11/24 (previously with excisions on 12/25/22 and 02/12/23). No residual disease on excision. Presents today for surveillance follow-up and notes new brown spots, biopsies performed today.   Melanoma in situ: - From original excisions in 2024, focal positive deep margin (one focal spot on 1 single slide). All other margins negative for MIS but positive for atypical lentiginous melanocytic hyperplasia (possible precursor lesion). - No residual melanoma in situ on excision from 02/11/24.  - Exam with two small brown lesions adjacent to the anterior midline vulvar scar which were biopsied. We will follow-up results. - Continue surveillance with pelvic exam q34mo with scouting biopsies/diagnostic biopsies as indicated. If skin changes, positive biopsies, could consider treatment with aldara.  We will follow-up biopsies and adjust plan as indicated. - Referral to [facility] for 2nd opinion.   RTC 41mo or sooner as indicated with biopsy results.  Hoy Masters, MD Gynecologic Oncology   Medical Decision Making I personally spent  TOTAL 30 minutes face-to-face and non-face-to-face in the care of this patient, which includes all pre, intra, and post visit time on the date of service.    ----------------------- Reason for Visit: Surveillance  Treatment History: Pt presented to her Ob/Gyn for evaluation of vulvar itching. She had noted itching the week prior, but this resolved with application of clobetasol  which she had for a diagnosis of lichen simplex chronicus. However, patient had also been previously noted by another provider to have a brown vulvar lesions of her right labia minora 12/2021 which was rechecked 03/2022 and noted to be stable  with plan for continued yearly follow-up.    On 11/12/22, her Ob/Gyn noted a 1cm brown lesion was noted at the top of her labia minora. Given the appearance of a melanotic lesion, she was recommended to return for a biopsy, which was completed on 11/20/22. Biopsy returned with melanoma in situ, edges involved. No invasive disease was seen.    She underwent a simple partial vulvectomy on 12/25/22 with path results: A. ANTERIOR VULVA, EXCISION: MELANOMA IN SITU, EXTENDING TO THE DEEP  SURGICAL MARGIN. THE PERIPHERAL MARGINS ARE NEGATIVE.  B. ANTERIOR VULVA, LEFT OF CLITORAL HOOD, EXCISION:  MELANOMA IN SITU  INVOLVING THE 12, 3, 6 AND 9 O'CLOCK MARGINS.    Given multiple positive margins, she returned to the OR on 02/12/23 for additional resection including a WLE of the anterior vulva including removal of remaining bilateral labia minora and removal of clitoris with pathology showing: A.   VULVA, ANTERIOR MARGIN, BIOPSY: NO MELANOMA IN SITU SEEN. COMMENT: Dr. Lorriane is in agreement. B.   VULVA, LEFT ANTERIOR MARGIN, BIOPSY: NO MELANOMA IN SITU SEEN. COMMENT: Dr. Lorriane is in agreement. C.   VULVA, LEFT MEDIAL MARGIN, BIOPSY: NO MELANOMA IN SITU SEEN. COMMENT: MelanA was used to evaluate this slide. Dr. Lorriane is in agreement D.   **PRELIMINARY DIAGNOSIS** VULVA, ANTERIOR WITH LEFT/RIGHT LABIA MINORA, EXCISION: -RESIDUAL MELANOMA IN SITU , MULTFOCAL RESIDUAL FOCI , INVOLVING BLACK/DEEP MARGIN (D2), -BACKGROUND ATYPICAL JUNCTIONAL MELANOCYTIC HYPERPLASIA INVOLVING MULTIFOCAL MARGINS (ANTERIOR, OUTER LEFT, INNER LEFT MARGINS INVOLVED WITH ATYPICAL JUNCTIONAL MELANOCYTIC HYPERPLASIA, SEE COMMENT) COMMENT: Sections show extensive areas of atypical lentiginous melanocytic hyperplasia involving the margins noted above, which may be a precursor lesion to the residual melanoma  in situ present. The atypical lentiginous melanocytic hyperplasia does not meet criteria for melanoma in situ. These atypical  lentiginous foci are negative with PRAME, and exhibit lesser cytological atypia on MITF staining. MelanA overstains many foci and is not contributory. However, residual melanoma in situ with focal upward (pagetoid) growth, severe atypia of melanocytes, and melanocytic confluence is seen in multifocal areas, but notably ONLY at the margin deep inked black (block D2). The remainder of the margins are free of melanoma in situ.  E.   VULVA, LEFT LATERAL MARGIN, EXCISION: ATYPICAL JUNCTIONAL MELANOCYTIC HYPERPLASIA, NO MELANOMA IN SITU COMMENT: No melanoma in situ is seen. PRAME, MelanA, and MITF were used to evaluate this margin. Dr. Lorriane has reviewed this case and is in agreement with the diagnoses rendered above. In managing this patient with extensive atypical lentiginous melanocytic hyperplasia, which likely represents a confluence of extensive melanotic macules and precursor disease to melanoma in situ, this reference article is provided: Oneda Betty HERO. May P. Candyce, Shitanshu Uppai: Margin status in vulvovaginal melanoma: Management and oncologic outcomes of 50 cases. Gynecol Oncol Rep. 2023 Oct; 49: [id number]    Scouting biopsies on 06/03/23: negative Scouting biopsies 10/14/254: lichenoid dermatitis Biopsies 12/23/23: melanoma in situ (left), atypical junctional melanocytic proliferation (right) Vulvectomy 02/11/24: negative   Interval History: Presents with her husband today.  Reports a few new brown spots that she first noted about 1.5 months ago.  Denies any itching or bleeding.  Does report some discharge with bowel movements when she has to strain.  Also feels like she has some pulling/tugging of the vagina occasionally.  Due next year for her colonoscopy.    Past Medical/Surgical History: Past Medical History:  Diagnosis Date   Actinic keratosis 03/29/2021   R central upper lip   Allergic rhinitis    Allergy    Anemia 10/29/2023   Anxiety    Arthritis    Atypical  mole 12/26/2021   spinal upper back, exc 01/15/22   Basal cell carcinoma 10/16/2010   R lower pretibia    Bipolar 1 disorder (HCC)    Chronic kidney disease    Chronic kidney disease    Depression    Dyspnea    Fissure, anal    GERD (gastroesophageal reflux disease)    History of basal cell carcinoma excision    ON FACE   History of squamous cell carcinoma excision    ON RIGHT LEG   Hyperlipidemia    Hypothyroidism    IBS (irritable bowel syndrome)    Liver cyst    Malignant melanoma in situ (HCC) 11/20/2022   Right labia minora. Excision 12/25/2022 at anterior vulva, left of clitoral hood, margins involved. Surgery scheduled for 02/12/2023. Recurrence 12/23/2023, excised 02/11/2024, margins clear.   Mallet finger of left hand    small finger   NSTEMI (non-ST elevated myocardial infarction) (HCC) 10/22/2023   Pneumonia    Pre-diabetes     Past Surgical History:  Procedure Laterality Date   ABLATION     CARDIAC CATHETERIZATION  04/10/1999   per pt normal   CARDIOVASCULAR STRESS TEST  02-23-2011  dr vinie fath [facility] clinic)   normal nuclear study/  no ischemia/  ef 64%   CARDIOVERSION N/A 01/10/2024   Procedure: CARDIOVERSION;  Surgeon: Hilarie Rocher, MD;  Location: [facility];  Service: Cardiovascular;  Laterality: N/A;   CATARACT EXTRACTION W/ INTRAOCULAR LENS  IMPLANT, BILATERAL  12/10/2001   Bil   CESAREAN SECTION  12/10/1982   CLOSED REDUCTION FINGER WITH  PERCUTANEOUS PINNING Left 04/22/2014   Procedure: LEFT SMALL FINGER CLOSED REDUCTION PINNING;  Surgeon: Prentice LELON Pagan, MD;  Location: [facility];  Service: Orthopedics;  Laterality: Left;   COLONOSCOPY  11/09/2002   DILATION AND CURETTAGE OF UTERUS  1977   LAPAROSCOPIC CHOLECYSTECTOMY  03/18/2000   RECONSTRUCTION OF EYELID Bilateral 08/2022   RIGHT/LEFT HEART CATH AND CORONARY ANGIOGRAPHY Bilateral 11/25/2023   Procedure: RIGHT/LEFT HEART CATH AND CORONARY ANGIOGRAPHY;  Surgeon: Florencio Cara BIRCH,  MD;  Location: [facility];  Service: Cardiovascular;  Laterality: Bilateral;   TEE WITHOUT CARDIOVERSION N/A 01/10/2024   Procedure: TRANSESOPHAGEAL ECHOCARDIOGRAM (TEE);  Surgeon: Hilarie Rocher, MD;  Location: [facility];  Service: Cardiovascular;  Laterality: N/A;   TRANSTHORACIC ECHOCARDIOGRAM  02/23/2011   normal lvf/  ef 58%/  mild rve/   mild lae/  mild  mr  & tr/  mild pulmonary htn   VULVECTOMY N/A 12/25/2022   Procedure: PARTIAL  VULVECTOMY;  Surgeon: Eldonna Mays, MD;  Location: [facility];  Service: Gynecology;  Laterality: N/A;   VULVECTOMY N/A 02/12/2023   Procedure: WIDE LOCAL EXCISION OF VULVA AND REMOVAL OF CLITORIS;  Surgeon: Eldonna Mays, MD;  Location: [facility];  Service: Gynecology;  Laterality: N/A;   VULVECTOMY N/A 02/11/2024   Procedure: WIDE EXCISION VULVECTOMY;  Surgeon: Eldonna Mays, MD;  Location: [facility];  Service: Gynecology;  Laterality: N/A;   WISDOM TOOTH EXTRACTION      Family History  Problem Relation Age of Onset   Stroke Mother    Heart disease Mother    Hypertension Father    Diabetes Father    Hyperlipidemia Father    Breast cancer Sister    Cancer Sister    Breast cancer Sister    Diabetes Brother    Alcohol abuse Sister    Arthritis Sister    Mental illness Sister    Colon cancer Neg Hx    Ovarian cancer Neg Hx    Endometrial cancer Neg Hx    Pancreatic cancer Neg Hx    Prostate cancer Neg Hx     Social History   Socioeconomic History   Marital status: Married    Spouse name: Not on file   Number of children: 1   Years of education: Not on file   Highest education level: Bachelor's degree (e.g., BA, AB, BS)  Occupational History   Occupation: Home    Employer: RETIRED  Tobacco Use   Smoking status: Never   Smokeless tobacco: Never  Vaping Use   Vaping status: Never Used  Substance and Sexual Activity   Alcohol use: Not Currently    Comment: Only on cruises or rarely   Drug  use: No   Sexual activity: Not Currently    Birth control/protection: Post-menopausal  Other Topics Concern   Not on file  Social History Narrative   Moved here from [place]   Social Drivers of Health   Financial Resource Strain: Low Risk  (05/24/2024)   Overall Financial Resource Strain (CARDIA)    Difficulty of Paying Living Expenses: Not hard at all  Food Insecurity: No Food Insecurity (05/24/2024)   Hunger Vital Sign    Worried About Running Out of Food in the Last Year: Never true    Ran Out of Food in the Last Year: Never true  Transportation Needs: No Transportation Needs (05/24/2024)   PRAPARE - Administrator, Civil Service (Medical): No    Lack of Transportation (Non-Medical):  No  Physical Activity: Sufficiently Active (05/24/2024)   Exercise Vital Sign    Days of Exercise per Week: 3 days    Minutes of Exercise per Session: 50 min  Stress: Stress Concern Present (05/24/2024)   [employer] of Occupational Health - Occupational Stress Questionnaire    Feeling of Stress: To some extent  Social Connections: Socially Integrated (05/24/2024)   Social Connection and Isolation Panel    Frequency of Communication with Friends and Family: More than three times a week    Frequency of Social Gatherings with Friends and Family: Once a week    Attends Religious Services: More than 4 times per year    Active Member of [employer] or Organizations: Yes    Attends Engineer, structural: More than 4 times per year    Marital Status: Married    Current Medications:  Current Outpatient Medications:    acetaminophen  (TYLENOL ) 650 MG CR tablet, Take 1,300 mg by mouth every 8 (eight) hours as needed for pain., Disp: , Rfl:    apixaban (ELIQUIS) 5 MG TABS tablet, Take 5 mg by mouth 2 (two) times daily., Disp: , Rfl:    ARIPiprazole  (ABILIFY ) 10 MG tablet, Take 10 mg by mouth at bedtime., Disp: , Rfl:    Biotin 5000 MCG CAPS, Take 5,000 mcg by mouth daily., Disp: , Rfl:     buPROPion  (WELLBUTRIN  XL) 300 MG 24 hr tablet, Take 300 mg by mouth daily., Disp: , Rfl:    cetirizine (ZYRTEC ALLERGY) 10 MG tablet, Take 10 mg by mouth daily., Disp: , Rfl:    cholecalciferol (VITAMIN D3) 25 MCG (1000 UNIT) tablet, Take 1,000 Units by mouth daily., Disp: , Rfl:    clonazePAM  (KLONOPIN ) 1 MG tablet, Take 1 mg by mouth at bedtime., Disp: , Rfl:    Coenzyme Q10 (COQ10) 100 MG CAPS, Take 100 mg by mouth at bedtime., Disp: , Rfl:    famotidine  (PEPCID ) 20 MG tablet, TAKE 1 TABLET TWICE DAILY, Disp: 180 tablet, Rfl: 1   fluticasone  (FLONASE ) 50 MCG/ACT nasal spray, Place 2 sprays into both nostrils daily., Disp: 48 g, Rfl: 3   JARDIANCE 10 MG TABS tablet, Take 10 mg by mouth daily., Disp: , Rfl:    lamoTRIgine  (LAMICTAL ) 200 MG tablet, Take 200 mg by mouth at bedtime., Disp: , Rfl:    levothyroxine  (SYNTHROID ) 50 MCG tablet, TAKE 1 TABLET EVERY DAY BEFORE BREAKFAST, Disp: 90 tablet, Rfl: 1   lisinopril  (ZESTRIL ) 2.5 MG tablet, Take 1 tablet (2.5 mg total) by mouth daily., Disp: 30 tablet, Rfl: 0   lovastatin  (MEVACOR ) 40 MG tablet, TAKE 1 TABLET EVERY DAY, Disp: 90 tablet, Rfl: 1   nystatin  (MYCOSTATIN /NYSTOP ) powder, Apply 1 Application topically 3 (three) times daily. Under abdomen skin fold, Disp: 15 g, Rfl: 2   torsemide (DEMADEX) 20 MG tablet, Take 20 mg by mouth daily., Disp: , Rfl:    zolpidem  (AMBIEN ) 10 MG tablet, Take 10 mg by mouth at bedtime., Disp: , Rfl:    Azelaic Acid  15 % gel, Apply once or twice daily to face (Patient not taking: Reported on 06/03/2024), Disp: 50 g, Rfl: 5   tacrolimus  (PROTOPIC ) 0.1 % ointment, APPLY 1 FILM TOPICALLY IN THE MORNING AND AT BEDTIME. (Patient not taking: Reported on 06/03/2024), Disp: 30 g, Rfl: 0  Review of Symptoms: Complete 10-system review is positive for: Shortness of breath, joint pain, easy bleeding, decreased concentration, anxiety  Physical Exam: BP 104/62 (BP Location: Right Arm, Patient Position: Sitting)  Pulse 97    Temp 99.2 F (37.3 C) (Oral)   Resp 19   Wt 179 lb 6.4 oz (81.4 kg)   SpO2 96%   BMI 35.04 kg/m  General: Alert, oriented, no acute distress. HEENT: Normocephalic, atraumatic. Neck symmetric without masses. Sclera anicteric.  Chest: Normal work of breathing.  Extremities: Grossly normal range of motion.  Warm, well perfused.  Edema bilateral ankles Skin: No rashes or lesions noted. GU: External genitalia with well healed anterior vulvar resection. 2-67mm area of brown at the superior left aspect of the midline anterior vulvar scar and a small area of brown at the mid left anterior midline vulvar scar. Exam chaperoned by Eleanor Epps, NP   VULVAR BIOPSY  AJANAE VIRAG is a 68 y.o. woman who presents today for a vulvar biopsy, see details of the visit above.  The procedure was explained to the patient and verbal consent was obtained prior to the procedure.  The skin was cleaned with Betadine.  1 ml of 0.25% marcaine  was injected at the planned biopsy site.  A 3 mm punch biopsy was used to obtain the biopsies specimen.  Hemostasis was achieved with silver nitrate.  Patient tolerated the procedure well.   Laboratory & Radiologic Studies: none

## 2024-06-08 NOTE — Patient Instructions (Signed)
 Today, Dr. Eldonna took two biopsies of the vulva. You can rinse the vulva after toileting and pat dry. You may have light spotting or a grayish discharge from the medication used.   We have placed an urgent referral to [facility] dermatology. You should receive a phone call with this appointment.   We will go ahead and make the 3 month appointment so this is on the schedule and this can always be adjusted if needed.  Please call the office for any needs or concerns.

## 2024-06-10 ENCOUNTER — Ambulatory Visit (INDEPENDENT_AMBULATORY_CARE_PROVIDER_SITE_OTHER): Admitting: Family Medicine

## 2024-06-10 ENCOUNTER — Ambulatory Visit (INDEPENDENT_AMBULATORY_CARE_PROVIDER_SITE_OTHER)
Admission: RE | Admit: 2024-06-10 | Discharge: 2024-06-10 | Disposition: A | Discharge status: Home / Self Care | Source: Ambulatory Visit | Attending: Family Medicine | Admitting: Family Medicine

## 2024-06-10 ENCOUNTER — Encounter: Payer: Self-pay | Admitting: Family Medicine

## 2024-06-10 VITALS — BP 100/80 | HR 77 | Temp 97.8°F | Ht 60.0 in | Wt 179.1 lb

## 2024-06-10 DIAGNOSIS — M25561 Pain in right knee: Secondary | ICD-10-CM

## 2024-06-10 DIAGNOSIS — M1711 Unilateral primary osteoarthritis, right knee: Secondary | ICD-10-CM

## 2024-06-10 DIAGNOSIS — M25461 Effusion, right knee: Secondary | ICD-10-CM | POA: Diagnosis not present

## 2024-06-10 DIAGNOSIS — M170 Bilateral primary osteoarthritis of knee: Secondary | ICD-10-CM | POA: Diagnosis not present

## 2024-06-10 MED ORDER — TRIAMCINOLONE ACETONIDE 40 MG/ML IJ SUSP
40.0000 mg | Freq: Once | INTRAMUSCULAR | Status: AC
  Administered 2024-06-10: 40 mg via INTRA_ARTICULAR

## 2024-06-10 NOTE — Progress Notes (Unsigned)
 Shayle Donahoo T. Jojo Geving, MD, CAQ Sports Medicine [facility] [address] [place], [address]  Phone: [phone]  FAX: [phone]  [name] [name] - 68 y.o. female  MRN [id number]  Date of Birth: 1956/12/03  Date: 06/10/2024  PCP: Randeen Laine LABOR, MD  Referral: Randeen Laine LABOR, MD  Chief Complaint  Patient presents with   Knee Pain   Subjective:   [name] [name] is a 68 y.o. very pleasant female patient with Body mass index is 34.98 kg/m. who presents with the following:  [name] [name] presents with follow-up for ongoing right-sided knee pain.  I saw her many years ago for some tennis elbow.  X-rays from 2021 do show mild to moderate tricompartmental osteoarthritis.  Patient started to workout more vigorously at the end of May and then she exercised for about 5 days in a row.  Initially, they were doing more upper body and banding work, and she did this without much significant difficulty.  After a few days, but then do more lower body centric activity, and subsequent to this she started to develop some significant pain in the knee.  She points to pain in the anterior as well as medial aspect of the knee.  She has not had that much swelling, but she has pain with any kind of extension or flexion.  She has tried relative rest as well as topical Voltaren  gel and she continues to have symptoms.  Went back to start working out and on [employer], then four days in the row  Review of Systems is noted in the HPI, as appropriate  Objective:   BP 100/80   Pulse 77   Temp 97.8 F (36.6 C) (Temporal)   Ht 5' (1.524 m)   Wt 179 lb 2 oz (81.3 kg)   SpO2 98%   BMI 34.98 kg/m   GEN: No acute distress; alert,appropriate. PULM: Breathing comfortably in no respiratory distress PSYCH: Normally interactive.   Right knee: Minimal effusion She does have pain with loading the medial and lateral patellar facets Medial greater than lateral joint line  tenderness Full extension and flexion to 95 There is pain at the anserine bursa Minimal to no pain at the patellar and quadricep tendons Stable to varus and valgus stress ACL PCL are intact  Bounce home testing flexion pinch and McMurray's are all positive for pain  Laboratory and Imaging Data:  Assessment and Plan:     ICD-10-CM   1. Acute pain of right knee  M25.561 triamcinolone  acetonide (KENALOG-40) injection 40 mg    2. Primary osteoarthritis of right knee  M17.11 DG Knee 4 Views W/Patella Right    triamcinolone  acetonide (KENALOG-40) injection 40 mg     Acute on chronic right-sided knee osteoarthritis with exacerbation.  She is on Eliquis and cannot take oral NSAIDs.  She has been working on her weight and has lost around 50 pounds.  We will do an intra-articular injection of May.  The patient does have a reported allergy to lidocaine , so we will do Kenalog only.  Aspiration/Injection Procedure Note [name] [name] 06-Jun-1956 Date of procedure: 06/10/2024  Procedure: Large Joint Aspiration / Injection of Knee, R Indications: Pain  Procedure Details Patient verbally consented to procedure. Risks, benefits, and alternatives explained. Sterilely prepped with Chloraprep. Ethyl cholride used for anesthesia. 1 mL of Kenalog 40 mg injected using the anteromedial approach without difficulty. No complications with procedure and tolerated well. Patient had decreased pain post-injection.  Medication: 1 mL of Kenalog 40 mg   Medication Management during today's office visit: Meds ordered this encounter  Medications   triamcinolone  acetonide (KENALOG-40) injection 40 mg   Medications Discontinued During This Encounter  Medication Reason   tacrolimus  (PROTOPIC ) 0.1 % ointment Completed Course   tacrolimus  (PROTOPIC ) 0.1 % ointment Completed Course   nystatin  (MYCOSTATIN /NYSTOP ) powder Duplicate    Orders placed today for conditions managed today: Orders Placed This Encounter   Procedures   DG Knee 4 Views W/Patella Right    Disposition: No follow-ups on file.  [employer] One speech-to-text software was used for transcription in this dictation.  Possible transcriptional errors can occur using Animal nutritionist.   Signed,  Jacques DASEN. Paisly Fingerhut, MD   Outpatient Encounter Medications as of 06/10/2024  Medication Sig   acetaminophen  (TYLENOL ) 650 MG CR tablet Take 1,300 mg by mouth every 8 (eight) hours as needed for pain.   apixaban (ELIQUIS) 5 MG TABS tablet Take 5 mg by mouth 2 (two) times daily.   ARIPiprazole  (ABILIFY ) 10 MG tablet Take 10 mg by mouth at bedtime.   Azelaic Acid  15 % gel Apply once or twice daily to face   Biotin 5000 MCG CAPS Take 5,000 mcg by mouth daily.   buPROPion  (WELLBUTRIN  XL) 300 MG 24 hr tablet Take 300 mg by mouth daily.   cetirizine (ZYRTEC ALLERGY) 10 MG tablet Take 10 mg by mouth daily.   cholecalciferol (VITAMIN D3) 25 MCG (1000 UNIT) tablet Take 1,000 Units by mouth daily.   clonazePAM  (KLONOPIN ) 1 MG tablet Take 1 mg by mouth at bedtime.   Coenzyme Q10 (COQ10) 100 MG CAPS Take 100 mg by mouth at bedtime.   famotidine  (PEPCID ) 20 MG tablet TAKE 1 TABLET TWICE DAILY   fluticasone  (FLONASE ) 50 MCG/ACT nasal spray Place 2 sprays into both nostrils daily.   JARDIANCE 10 MG TABS tablet Take 10 mg by mouth daily.   lamoTRIgine  (LAMICTAL ) 200 MG tablet Take 200 mg by mouth at bedtime.   levothyroxine  (SYNTHROID ) 50 MCG tablet TAKE 1 TABLET EVERY DAY BEFORE BREAKFAST   lisinopril  (ZESTRIL ) 2.5 MG tablet Take 1 tablet (2.5 mg total) by mouth daily.   lovastatin  (MEVACOR ) 40 MG tablet TAKE 1 TABLET EVERY DAY   nystatin  powder Apply 1 Application topically 3 (three) times daily as needed.   torsemide (DEMADEX) 20 MG tablet Take 20 mg by mouth daily.   zolpidem  (AMBIEN ) 10 MG tablet Take 10 mg by mouth at bedtime.   [DISCONTINUED] nystatin  (MYCOSTATIN /NYSTOP ) powder Apply 1 Application topically 3 (three) times daily. Under abdomen  skin fold   [DISCONTINUED] tacrolimus  (PROTOPIC ) 0.1 % ointment APPLY 1 FILM TOPICALLY IN THE MORNING AND AT BEDTIME. (Patient not taking: Reported on 06/03/2024)   [EXPIRED] triamcinolone  acetonide (KENALOG-40) injection 40 mg    No facility-administered encounter medications on file as of 06/10/2024.

## 2024-06-11 ENCOUNTER — Other Ambulatory Visit: Payer: Self-pay | Admitting: Family Medicine

## 2024-06-16 DIAGNOSIS — L578 Other skin changes due to chronic exposure to nonionizing radiation: Secondary | ICD-10-CM | POA: Diagnosis not present

## 2024-06-16 DIAGNOSIS — Z85820 Personal history of malignant melanoma of skin: Secondary | ICD-10-CM | POA: Diagnosis not present

## 2024-06-16 DIAGNOSIS — D229 Melanocytic nevi, unspecified: Secondary | ICD-10-CM | POA: Diagnosis not present

## 2024-06-16 DIAGNOSIS — L814 Other melanin hyperpigmentation: Secondary | ICD-10-CM | POA: Diagnosis not present

## 2024-06-16 DIAGNOSIS — L821 Other seborrheic keratosis: Secondary | ICD-10-CM | POA: Diagnosis not present

## 2024-06-19 LAB — SURGICAL PATHOLOGY

## 2024-06-22 ENCOUNTER — Ambulatory Visit: Payer: Self-pay | Admitting: Psychiatry

## 2024-06-24 ENCOUNTER — Ambulatory Visit (INDEPENDENT_AMBULATORY_CARE_PROVIDER_SITE_OTHER): Admitting: Psychology

## 2024-06-24 DIAGNOSIS — I129 Hypertensive chronic kidney disease with stage 1 through stage 4 chronic kidney disease, or unspecified chronic kidney disease: Secondary | ICD-10-CM | POA: Diagnosis not present

## 2024-06-24 DIAGNOSIS — R8290 Unspecified abnormal findings in urine: Secondary | ICD-10-CM | POA: Diagnosis not present

## 2024-06-24 DIAGNOSIS — F3181 Bipolar II disorder: Secondary | ICD-10-CM

## 2024-06-24 DIAGNOSIS — R600 Localized edema: Secondary | ICD-10-CM | POA: Diagnosis not present

## 2024-06-24 DIAGNOSIS — R809 Proteinuria, unspecified: Secondary | ICD-10-CM | POA: Diagnosis not present

## 2024-06-24 DIAGNOSIS — N1831 Chronic kidney disease, stage 3a: Secondary | ICD-10-CM | POA: Diagnosis not present

## 2024-06-24 DIAGNOSIS — I272 Pulmonary hypertension, unspecified: Secondary | ICD-10-CM | POA: Diagnosis not present

## 2024-06-24 DIAGNOSIS — N1832 Chronic kidney disease, stage 3b: Secondary | ICD-10-CM | POA: Diagnosis not present

## 2024-06-24 NOTE — Progress Notes (Unsigned)
                edge,  experiencing concentration difficulties, having trouble falling or staying asleep, exhibiting a general  state of irritability).: No Description Entered (Status: improved). Motor tension (e.g., restlessness,  tiredness, shakiness, muscle tension).: No Description Entered (Status: improved).  Problems Addressed  Anxiety, Phase Of Life Problems, Anxiety  Goals 1. Learn and implement coping skills that result in a reduction of anxiety  and worry, and improved daily functioning. Objective Learn  and implement calming skills to reduce overall anxiety and manage anxiety symptoms. Target Date: 2025-08-09Frequency: Weekly Progress: 40 Modality: individual  Related Interventions 1. Teach the client calming/relaxation skills (e.g., applied relaxation, progressive muscle  relaxation, cue controlled relaxation; mindful breathing; biofeedback) and how to discriminate  better between relaxation and tension; teach the client how to apply these skills to his/her daily  life (e.g., New Directions in Progressive Muscle Relaxation by Marcelyn Ditty, and  Hazlett-Stevens; Treating Generalized Anxiety Disorder by Rygh and Ida Rogue). Objective Identify, challenge, and replace biased, fearful self-talk with positive, realistic, and empowering selftalk. Target Date: 2024-07-18 Frequency: weekly Progress: 30 Modality: individual Related Interventions 1. Explore the client's schema and self-talk that mediate his/her fear response; assist him/her in  challenging the biases; replace the distorted messages with reality-based alternatives and  positive, realistic self-talk that will increase his/her self-confidence in coping with irrational  fears (see Cognitive Therapy of Anxiety Disorders by Laurence Slate). Objective Learn and implement problem-solving strategies for realistically addressing worries. Target Date: 2025-08-09Frequency: weekly Progress: 40 Modality: individual 2. Resolve conflicted feelings and adapt to the new life circumstances. Objective Apply problem-solving skills to current circumstances. Target Date: 2024-07-18 Frequency: weekly Progress: 20 Modality: individual Related Interventions 1. Teach the client problem-resolution skills (e.g., defining the problem clearly, brainstorming  multiple solutions, listing the pros and cons of each solution, seeking input from others,  selecting and implementing a plan of action, evaluating outcome, and readjusting plan as   necessary).   3. Stabilize anxiety level while increasing ability to function on a daily  basis. Diagnosis F33.1  Major depressive disorder, moderate 300.02 (Generalized anxiety disorder) - Open - [Signifier: n/a]  Axis  none 309.28 (Adjustment disorder with mixed anxiety and depressed  mood) - Open - [Signifier: n/a]  Adjustment Disorder,  With Anxiety   Marital conflict  Major Depressive disorder, moderate  Conditions For Discharge Achievement of treatment goals and objectives.  The patient approved this plan.   Deonna Krummel G Ethridge Sollenberger, LCSW

## 2024-06-25 ENCOUNTER — Ambulatory Visit: Admitting: Psychology

## 2024-06-26 ENCOUNTER — Ambulatory Visit: Payer: Self-pay

## 2024-06-26 ENCOUNTER — Telehealth: Payer: Self-pay

## 2024-06-26 DIAGNOSIS — S86911A Strain of unspecified muscle(s) and tendon(s) at lower leg level, right leg, initial encounter: Secondary | ICD-10-CM | POA: Diagnosis not present

## 2024-06-26 NOTE — Telephone Encounter (Signed)
 Aware, will watch for correspondence from UC

## 2024-06-26 NOTE — Telephone Encounter (Signed)
 FYI Only or Action Required?: FYI only for provider.  Patient was last seen in primary care on 06/10/2024 by Watt Mirza, MD.  Called Nurse Triage reporting Knee Pain.  Symptoms began several days ago.  Interventions attempted: Rest, hydration, or home remedies.  Symptoms are: gradually worsening.  Triage Disposition: No disposition on file.  Patient/caregiver understands and will follow disposition?: YesCopied from CRM [id number]. Topic: Clinical - Red Word Triage >> Jun 26, 2024  9:08 AM Suzen RAMAN wrote: Red Word that prompted transfer to Nurse Triage: extreme knee pain Answer Assessment - Initial Assessment Questions 1. LOCATION and RADIATION: Where is the pain located?      Right knee 2. QUALITY: What does the pain feel like?  (e.g., sharp, dull, aching, burning)     sharp 3. SEVERITY: How bad is the pain? What does it keep you from doing?   (Scale 1-10; or mild, moderate, severe)     8 4. ONSET: When did the pain start? Does it come and go, or is it there all the time?     Wednesday   6. SETTING: Has there been any recent work, exercise or other activity that involved that part of the body?      Na  7. AGGRAVATING FACTORS: What makes the knee pain worse? (e.g., walking, climbing stairs, running)     Hurts to stand and walk 8. ASSOCIATED SYMPTOMS: Is there any swelling or redness of the knee?     Denies all 9. OTHER SYMPTOMS: Do you have any other symptoms? (e.g., calf pain, chest pain, difficulty breathing, fever)     Denies all    Wednesday while standing in kitchen something in knee buckled. It hurts to walk. Pt has to turn sideways to walk. Pt tried brace last night and pain is worse now. Pt recently had steroid shot in same knee on 7/2. No office appt available within appropriate timeframe. RN advised UC. Pt stated she will go.  Protocols used: Knee Pain-A-AH

## 2024-06-26 NOTE — Telephone Encounter (Signed)
Patient picked up ppwk.

## 2024-06-26 NOTE — Telephone Encounter (Signed)
 Pt requests Disc of knee xray for Ortho referral. Disc burned and left at the front desk for pick up.

## 2024-06-30 DIAGNOSIS — R600 Localized edema: Secondary | ICD-10-CM | POA: Diagnosis not present

## 2024-06-30 DIAGNOSIS — N1831 Chronic kidney disease, stage 3a: Secondary | ICD-10-CM | POA: Diagnosis not present

## 2024-06-30 DIAGNOSIS — I272 Pulmonary hypertension, unspecified: Secondary | ICD-10-CM | POA: Diagnosis not present

## 2024-06-30 DIAGNOSIS — I129 Hypertensive chronic kidney disease with stage 1 through stage 4 chronic kidney disease, or unspecified chronic kidney disease: Secondary | ICD-10-CM | POA: Diagnosis not present

## 2024-07-02 DIAGNOSIS — I480 Paroxysmal atrial fibrillation: Secondary | ICD-10-CM | POA: Diagnosis not present

## 2024-07-07 DIAGNOSIS — I480 Paroxysmal atrial fibrillation: Secondary | ICD-10-CM | POA: Diagnosis not present

## 2024-07-07 DIAGNOSIS — G4733 Obstructive sleep apnea (adult) (pediatric): Secondary | ICD-10-CM | POA: Diagnosis not present

## 2024-07-07 DIAGNOSIS — Z8679 Personal history of other diseases of the circulatory system: Secondary | ICD-10-CM | POA: Diagnosis not present

## 2024-07-07 DIAGNOSIS — Z01810 Encounter for preprocedural cardiovascular examination: Secondary | ICD-10-CM | POA: Diagnosis not present

## 2024-07-07 DIAGNOSIS — Z7901 Long term (current) use of anticoagulants: Secondary | ICD-10-CM | POA: Diagnosis not present

## 2024-07-07 DIAGNOSIS — R911 Solitary pulmonary nodule: Secondary | ICD-10-CM | POA: Diagnosis not present

## 2024-07-08 DIAGNOSIS — Z7901 Long term (current) use of anticoagulants: Secondary | ICD-10-CM | POA: Diagnosis not present

## 2024-07-08 DIAGNOSIS — I4892 Unspecified atrial flutter: Secondary | ICD-10-CM | POA: Diagnosis not present

## 2024-07-08 DIAGNOSIS — R9431 Abnormal electrocardiogram [ECG] [EKG]: Secondary | ICD-10-CM | POA: Diagnosis not present

## 2024-07-08 DIAGNOSIS — I480 Paroxysmal atrial fibrillation: Secondary | ICD-10-CM | POA: Diagnosis not present

## 2024-07-08 DIAGNOSIS — I517 Cardiomegaly: Secondary | ICD-10-CM | POA: Diagnosis not present

## 2024-07-12 ENCOUNTER — Other Ambulatory Visit: Payer: Self-pay | Admitting: Family Medicine

## 2024-07-13 DIAGNOSIS — I4811 Longstanding persistent atrial fibrillation: Secondary | ICD-10-CM | POA: Diagnosis not present

## 2024-07-13 DIAGNOSIS — I517 Cardiomegaly: Secondary | ICD-10-CM | POA: Diagnosis not present

## 2024-07-13 DIAGNOSIS — R0989 Other specified symptoms and signs involving the circulatory and respiratory systems: Secondary | ICD-10-CM | POA: Diagnosis not present

## 2024-07-24 DIAGNOSIS — K921 Melena: Secondary | ICD-10-CM | POA: Diagnosis not present

## 2024-07-24 DIAGNOSIS — I480 Paroxysmal atrial fibrillation: Secondary | ICD-10-CM | POA: Diagnosis not present

## 2024-07-24 DIAGNOSIS — Z7901 Long term (current) use of anticoagulants: Secondary | ICD-10-CM | POA: Diagnosis not present

## 2024-07-28 DIAGNOSIS — G4733 Obstructive sleep apnea (adult) (pediatric): Secondary | ICD-10-CM | POA: Diagnosis not present

## 2024-07-28 DIAGNOSIS — R911 Solitary pulmonary nodule: Secondary | ICD-10-CM | POA: Diagnosis not present

## 2024-07-28 DIAGNOSIS — R0609 Other forms of dyspnea: Secondary | ICD-10-CM | POA: Diagnosis not present

## 2024-07-28 DIAGNOSIS — J4520 Mild intermittent asthma, uncomplicated: Secondary | ICD-10-CM | POA: Diagnosis not present

## 2024-07-30 DIAGNOSIS — M1711 Unilateral primary osteoarthritis, right knee: Secondary | ICD-10-CM | POA: Diagnosis not present

## 2024-07-30 DIAGNOSIS — M25561 Pain in right knee: Secondary | ICD-10-CM | POA: Diagnosis not present

## 2024-08-04 ENCOUNTER — Ambulatory Visit

## 2024-08-18 ENCOUNTER — Encounter: Payer: Self-pay | Admitting: Emergency Medicine

## 2024-08-18 ENCOUNTER — Ambulatory Visit: Admission: EM | Admit: 2024-08-18 | Discharge: 2024-08-18 | Disposition: A | Discharge status: Home / Self Care

## 2024-08-18 ENCOUNTER — Ambulatory Visit: Payer: Self-pay

## 2024-08-18 DIAGNOSIS — N907 Vulvar cyst: Secondary | ICD-10-CM

## 2024-08-18 MED ORDER — DOXYCYCLINE HYCLATE 100 MG PO CAPS: 100.0000 mg | ORAL_CAPSULE | Freq: Two times a day (BID) | ORAL | 0 refills | Status: DC

## 2024-08-18 NOTE — Telephone Encounter (Signed)
 FYI Only or Action Required?: FYI only for provider.  Patient was last seen in primary care on 06/10/2024 by Watt Mirza, MD.  Called Nurse Triage reporting Groin Swelling.  Symptoms began yesterday.  Interventions attempted: Nothing.  Symptoms are: gradually worsening.  Triage Disposition: See Physician Within 24 Hours  Patient/caregiver understands and will follow disposition?: Yes     Copied from CRM #[id number]. Topic: Clinical - Red Word Triage >> Aug 18, 2024  8:30 AM [name] wrote: Reason for RMF:ajmuynopw cyst or a bacterial infection in her vaginal, patient has a swollen part or lump on the left side of her vaginal and has a white pimple/pus on top Reason for Disposition  Genital area looks infected (e.g., draining sore, spreading redness)  Answer Assessment - Initial Assessment Questions 1. SYMPTOM: What's the main symptom you're concerned about? (e.g., pain, itching, dryness)     Cyst to left side of vagina 2. LOCATION: Where is the  cyst located? (e.g., inside/outside, left/right)     Left side of vagina 3. ONSET: When did the  symptoms  start?     Last night 4. PAIN: Is there any pain? If Yes, ask: How bad is it? (Scale: 1-10; mild, moderate, severe)     denies 5. ITCHING: Is there any itching? If Yes, ask: How bad is it? (Scale: 1-10; mild, moderate, severe)     denies 6. CAUSE: What do you think is causing the discharge? Have you had the same problem before? What happened then?     denies 7. OTHER SYMPTOMS: Do you have any other symptoms? (e.g., fever, itching, vaginal bleeding, pain with urination, injury to genital area, vaginal foreign body)     fever 8. PREGNANCY: Is there any chance you are pregnant? When was your last menstrual period?     na  Protocols used: Vaginal Symptoms-A-AH

## 2024-08-18 NOTE — Discharge Instructions (Addendum)
 On exam the area is painful and swollen but is still deep within the skin and therefore will not attempt to drain today as it most likely will not yield any drainage because it is not ready, is not a Bartholin cyst based on the placement and is most consistent with an ingrown hair  Take doxycycline  twice daily for 7 days  Hold warm-hot compresses to affected area at least 4 times a day, this helps to facilitate draining, the more the better  Please return for evaluation for increased swelling, increased tenderness or pain, non healing site, non draining site, you begin to have fever or chills   We reviewed the etiology of recurrent abscesses of skin.  Skin abscesses are collections of pus within the dermis and deeper skin tissues. Skin abscesses manifest as painful, tender, fluctuant, and erythematous nodules, frequently surmounted by a pustule and surrounded by a rim of erythematous swelling.  Spontaneous drainage of purulent material may occur.  Fever can occur on occasion.    -Skin abscesses can develop in healthy individuals with no predisposing conditions other than skin or nasal carriage of Staphylococcus aureus.  Individuals in close contact with others who have active infection with skin abscesses are at increased risk which is likely to explain why twin brother has similar episodes.   In addition, any process leading to a breach in the skin barrier can also predispose to the development of a skin abscesses, such as atopic dermatitis.

## 2024-08-18 NOTE — ED Provider Notes (Signed)
 [facility]    CSN: [id number] Arrival date & time: 08/18/24  0900      History   Chief Complaint Chief Complaint  Patient presents with   Cyst    HPI [name] [name] is a 68 y.o. female.   Patient presents for evaluation of a cyst in the vaginal area first noticed 1 day ago.  Has become painful and swollen.  Has applied warm compresses to the affected area.  Denies fever, drainage, vaginal discharge, itching or odor.  Has occurred prior.  Past Medical History:  Diagnosis Date   Actinic keratosis 03/29/2021   R central upper lip   Allergic rhinitis    Allergy    Anemia 10/29/2023   Anxiety    Arthritis    Atypical mole 12/26/2021   spinal upper back, exc 01/15/22   Basal cell carcinoma 10/16/2010   R lower pretibia    Bipolar 1 disorder (HCC)    Chronic kidney disease    Chronic kidney disease    Depression    Dyspnea    Fissure, anal    GERD (gastroesophageal reflux disease)    History of basal cell carcinoma excision    ON FACE   History of squamous cell carcinoma excision    ON RIGHT LEG   Hyperlipidemia    Hypothyroidism    IBS (irritable bowel syndrome)    Liver cyst    Malignant melanoma in situ (HCC) 11/20/2022   Right labia minora. Excision 12/25/2022 at anterior vulva, left of clitoral hood, margins involved. Surgery scheduled for 02/12/2023. Recurrence 12/23/2023, excised 02/11/2024, margins clear.   Mallet finger of left hand    small finger   NSTEMI (non-ST elevated myocardial infarction) (HCC) 10/22/2023   Pneumonia    Pre-diabetes     Patient Active Problem List   Diagnosis Date Noted   Knee pain, right 06/01/2024   Rosacea 04/02/2024   Pulmonary hypertension, unspecified (HCC) 12/31/2023   Edema of lower extremity 12/31/2023   Chronic kidney disease, stage 3a (HCC) 12/31/2023   NSTEMI (non-ST elevated myocardial infarction) (HCC) 10/22/2023   S/P ablation of atrial fibrillation 09/30/2023   Chronic anticoagulation 09/30/2023   Pain  of left hip 09/11/2023   History of anal fissures 09/09/2023   Osteopenia 07/30/2023   Headache 07/05/2023   Paroxysmal atrial fibrillation (HCC) 06/27/2023   Bradycardia 05/30/2023   Melanoma in situ (HCC) 12/25/2022   ASCUS of cervix with negative high risk HPV 06/25/2022   Osteoarthritis of knee 05/25/2022   Hemorrhoid 04/25/2022   Estrogen deficiency 01/02/2022   Bipolar disorder, in partial remission, most recent episode mixed (HCC) 02/25/2020   Dyspnea 01/19/2020   Prediabetes 03/03/2018   Atrophic vaginitis 07/08/2017   Cervical stenosis (uterine cervix) 01/03/2017   Hearing loss 10/25/2015   Routine general medical examination at a health care facility 10/17/2015   Facial rash 02/22/2015   Colon cancer screening 10/16/2013   Enthesopathy of ankle and tarsus 12/01/2009   Enthesopathy of ankle and tarsus 12/01/2009   Asymptomatic postmenopausal status 09/30/2009   Arthropathy of pelvic region and thigh 03/10/2009   DEGENERATIVE DISC DISEASE, LUMBAR SPINE 03/10/2009   DDD (degenerative disc disease), lumbosacral 03/10/2009   ONYCHOMYCOSIS 09/17/2008   Hypothyroidism 08/05/2007   Class 2 severe obesity due to excess calories with serious comorbidity and body mass index (BMI) of 35.0 to 35.9 in adult [facility]) 08/05/2007   Bipolar disorder (HCC) 08/05/2007   Allergic rhinitis 08/05/2007   Irritable bowel syndrome 08/05/2007   Esophageal  reflux 08/05/2007   HYPERCHOLESTEROLEMIA, PURE 07/07/2007    Past Surgical History:  Procedure Laterality Date   ABLATION     CARDIAC CATHETERIZATION  04/10/1999   per pt normal   CARDIOVASCULAR STRESS TEST  02-23-2011  dr vinie fath [facility] clinic)   normal nuclear study/  no ischemia/  ef 64%   CARDIOVERSION N/A 01/10/2024   Procedure: CARDIOVERSION;  Surgeon: Hilarie Rocher, MD;  Location: [facility];  Service: Cardiovascular;  Laterality: N/A;   CATARACT EXTRACTION W/ INTRAOCULAR LENS  IMPLANT, BILATERAL  12/10/2001   Bil   CESAREAN  SECTION  12/10/1982   CLOSED REDUCTION FINGER WITH PERCUTANEOUS PINNING Left 04/22/2014   Procedure: LEFT SMALL FINGER CLOSED REDUCTION PINNING;  Surgeon: Prentice LELON Pagan, MD;  Location: [facility];  Service: Orthopedics;  Laterality: Left;   COLONOSCOPY  11/09/2002   DILATION AND CURETTAGE OF UTERUS  1977   LAPAROSCOPIC CHOLECYSTECTOMY  03/18/2000   RECONSTRUCTION OF EYELID Bilateral 08/2022   RIGHT/LEFT HEART CATH AND CORONARY ANGIOGRAPHY Bilateral 11/25/2023   Procedure: RIGHT/LEFT HEART CATH AND CORONARY ANGIOGRAPHY;  Surgeon: Florencio Cara BIRCH, MD;  Location: [facility];  Service: Cardiovascular;  Laterality: Bilateral;   TEE WITHOUT CARDIOVERSION N/A 01/10/2024   Procedure: TRANSESOPHAGEAL ECHOCARDIOGRAM (TEE);  Surgeon: Hilarie Rocher, MD;  Location: [facility];  Service: Cardiovascular;  Laterality: N/A;   TRANSTHORACIC ECHOCARDIOGRAM  02/23/2011   normal lvf/  ef 58%/  mild rve/   mild lae/  mild  mr  & tr/  mild pulmonary htn   VULVECTOMY N/A 12/25/2022   Procedure: PARTIAL  VULVECTOMY;  Surgeon: Eldonna Mays, MD;  Location: [facility];  Service: Gynecology;  Laterality: N/A;   VULVECTOMY N/A 02/12/2023   Procedure: WIDE LOCAL EXCISION OF VULVA AND REMOVAL OF CLITORIS;  Surgeon: Eldonna Mays, MD;  Location: [facility];  Service: Gynecology;  Laterality: N/A;   VULVECTOMY N/A 02/11/2024   Procedure: WIDE EXCISION VULVECTOMY;  Surgeon: Eldonna Mays, MD;  Location: [facility];  Service: Gynecology;  Laterality: N/A;   WISDOM TOOTH EXTRACTION      OB History     Gravida  1   Para  1   Term  1   Preterm      AB      Living  1      SAB      IAB      Ectopic      Multiple      Live Births  1            Home Medications    Prior to Admission medications   Medication Sig Start Date End Date Taking? Authorizing Provider  doxycycline  (VIBRAMYCIN ) 100 MG capsule Take 1 capsule (100 mg total) by mouth  2 (two) times daily. 08/18/24  Yes Olesya Wike R, NP  ramelteon (ROZEREM) 8 MG tablet Take 8 mg by mouth at bedtime. 07/24/24  Yes [provider]  acetaminophen  (TYLENOL ) 650 MG CR tablet Take 1,300 mg by mouth every 8 (eight) hours as needed for pain.    [provider]  apixaban (ELIQUIS) 5 MG TABS tablet Take 5 mg by mouth 2 (two) times daily.    [provider]  ARIPiprazole  (ABILIFY ) 10 MG tablet Take 10 mg by mouth at bedtime. 07/11/23   [provider]  Azelaic Acid  15 % gel Apply once or twice daily to face 03/24/24   Jackquline Sawyer, MD  Biotin 5000 MCG CAPS Take 5,000 mcg  by mouth daily.    [provider]  buPROPion  (WELLBUTRIN  XL) 300 MG 24 hr tablet Take 300 mg by mouth daily. 04/08/23   [provider]  cetirizine (ZYRTEC ALLERGY) 10 MG tablet Take 10 mg by mouth daily.    [provider]  cholecalciferol (VITAMIN D3) 25 MCG (1000 UNIT) tablet Take 1,000 Units by mouth daily.    [provider]  clonazePAM  (KLONOPIN ) 1 MG tablet Take 1 mg by mouth at bedtime.    [provider]  Coenzyme Q10 (COQ10) 100 MG CAPS Take 100 mg by mouth at bedtime.    [provider]  famotidine  (PEPCID ) 20 MG tablet TAKE 1 TABLET TWICE DAILY 02/17/24   Tower, Laine LABOR, MD  fluticasone  (FLONASE ) 50 MCG/ACT nasal spray Place 2 sprays into both nostrils daily. 06/01/24   Tower, Laine LABOR, MD  JARDIANCE 10 MG TABS tablet Take 10 mg by mouth daily.    [provider]  lamoTRIgine  (LAMICTAL ) 200 MG tablet Take 200 mg by mouth at bedtime. 11/06/23   [provider]  levothyroxine  (SYNTHROID ) 50 MCG tablet TAKE 1 TABLET EVERY DAY BEFORE BREAKFAST 06/11/24   Tower, Laine LABOR, MD  lisinopril  (ZESTRIL ) 2.5 MG tablet Take 1 tablet (2.5 mg total) by mouth daily. 10/25/23   Alexander, Natalie, DO  lovastatin  (MEVACOR ) 40 MG tablet TAKE 1 TABLET EVERY DAY 06/11/24   Tower, Laine LABOR, MD  nystatin  powder Apply 1 Application  topically 3 (three) times daily as needed.    [provider]  torsemide (DEMADEX) 20 MG tablet Take 20 mg by mouth daily. 11/15/23   [provider]  zolpidem  (AMBIEN ) 10 MG tablet Take 5 mg by mouth at bedtime.    [provider]    Family History Family History  Problem Relation Age of Onset   Stroke Mother    Heart disease Mother    Hypertension Father    Diabetes Father    Hyperlipidemia Father    Breast cancer Sister    Cancer Sister    Breast cancer Sister    Diabetes Brother    Alcohol abuse Sister    Arthritis Sister    Mental illness Sister    Colon cancer Neg Hx    Ovarian cancer Neg Hx    Endometrial cancer Neg Hx    Pancreatic cancer Neg Hx    Prostate cancer Neg Hx     Social History Social History   Tobacco Use   Smoking status: Never   Smokeless tobacco: Never  Vaping Use   Vaping status: Never Used  Substance Use Topics   Alcohol use: Not Currently    Comment: Only on cruises or rarely   Drug use: No     Allergies   Ingrezza [valbenazine tosylate], Lidocaine , Xylocaine  [lidocaine  hcl], Albuterol, Amantadine, Austedo xr [deutetrabenazine], Codeine, Lithium, Singulair [montelukast], and Tegretol [carbamazepine]   Review of Systems Review of Systems   Physical Exam Triage Vital Signs ED Triage Vitals  Encounter Vitals Group     BP 08/18/24 0911 118/62     Girls Systolic BP Percentile --      Girls Diastolic BP Percentile --      Boys Systolic BP Percentile --      Boys Diastolic BP Percentile --      Pulse Rate 08/18/24 0911 (!) 58     Resp 08/18/24 0911 20     Temp 08/18/24 0911 98.3 F (36.8 C)     Temp Source 08/18/24  9088 Oral     SpO2 08/18/24 0911 98 %     Weight --      Height --      Head Circumference --      Peak Flow --      Pain Score 08/18/24 0908 3     Pain Loc --      Pain Education --      Exclude from Growth Chart --    No data found.  Updated Vital Signs BP 118/62 (BP Location: Left  Arm)   Pulse (!) 58   Temp 98.3 F (36.8 C) (Oral)   Resp 20   SpO2 98%   Visual Acuity Right Eye Distance:   Left Eye Distance:   Bilateral Distance:    Right Eye Near:   Left Eye Near:    Bilateral Near:     Physical Exam Constitutional:      Appearance: Normal appearance.  Eyes:     Extraocular Movements: Extraocular movements intact.  Pulmonary:     Effort: Pulmonary effort is normal.  Genitourinary:     Comments: 1 x 2 erythematous and tender immature abscess present to the left labia majora Neurological:     Mental Status: She is alert and oriented to person, place, and time. Mental status is at baseline.      UC Treatments / Results  Labs (all labs ordered are listed, but only abnormal results are displayed) Labs Reviewed - No data to display  EKG   Radiology No results found.  Procedures Procedures (including critical care time)  Medications Ordered in UC Medications - No data to display  Initial Impression / Assessment and Plan / UC Course  I have reviewed the triage vital signs and the nursing notes.  Pertinent labs & imaging results that were available during my care of the patient were reviewed by me and considered in my medical decision making (see chart for details).  Labial cyst  Presentation consistent with abscess most likely due to ingrown hair, discussed with patient, her abscess is immature and deep within the skin unable to attempt I&D, discussed with patient, prescribed doxycycline  recommended continue warm compresses to the affected area, advised to follow-up for nonhealing nondraining site Final Clinical Impressions(s) / UC Diagnoses   Final diagnoses:  Labial cyst     Discharge Instructions      On exam the area is painful and swollen but is still deep within the skin and therefore will not attempt to drain today as it most likely will not yield any drainage because it is not ready, is not a Bartholin cyst based on the  placement and is most consistent with an ingrown hair  Take doxycycline  twice daily for 7 days  Hold warm-hot compresses to affected area at least 4 times a day, this helps to facilitate draining, the more the better  Please return for evaluation for increased swelling, increased tenderness or pain, non healing site, non draining site, you begin to have fever or chills   We reviewed the etiology of recurrent abscesses of skin.  Skin abscesses are collections of pus within the dermis and deeper skin tissues. Skin abscesses manifest as painful, tender, fluctuant, and erythematous nodules, frequently surmounted by a pustule and surrounded by a rim of erythematous swelling.  Spontaneous drainage of purulent material may occur.  Fever can occur on occasion.    -Skin abscesses can develop in healthy individuals with no predisposing conditions other than skin or nasal carriage of Staphylococcus  aureus.  Individuals in close contact with others who have active infection with skin abscesses are at increased risk which is likely to explain why twin brother has similar episodes.   In addition, any process leading to a breach in the skin barrier can also predispose to the development of a skin abscesses, such as atopic dermatitis.      ED Prescriptions     Medication Sig Dispense Auth. Provider   doxycycline  (VIBRAMYCIN ) 100 MG capsule Take 1 capsule (100 mg total) by mouth 2 (two) times daily. 14 capsule Kobe Jansma, Shelba SAUNDERS, NP      PDMP not reviewed this encounter.   Teresa Shelba SAUNDERS, [place] 08/18/24 [phone]

## 2024-08-18 NOTE — Telephone Encounter (Signed)
 Aware, will watch for correspondence

## 2024-08-18 NOTE — ED Triage Notes (Signed)
 Patient reports lump left vagina could possible be Bartholin's cyst. Patient complains pain and swelling in that area. Rates 3/10. Patient reports using warm compression on site with mild relief.

## 2024-08-19 ENCOUNTER — Ambulatory Visit: Admitting: Psychology

## 2024-08-19 DIAGNOSIS — F4322 Adjustment disorder with anxiety: Secondary | ICD-10-CM | POA: Diagnosis not present

## 2024-08-19 DIAGNOSIS — F3181 Bipolar II disorder: Secondary | ICD-10-CM | POA: Diagnosis not present

## 2024-08-19 NOTE — Progress Notes (Unsigned)
                edge,  experiencing concentration difficulties, having trouble falling or staying asleep, exhibiting a general  state of irritability).: No Description Entered (Status: improved). Motor tension (e.g., restlessness,  tiredness, shakiness, muscle tension).: No Description Entered (Status: improved).  Problems Addressed  Anxiety, Phase Of Life Problems, Anxiety  Goals 1. Learn and implement coping skills that result in a reduction of anxiety  and worry, and improved daily functioning. Objective Learn  and implement calming skills to reduce overall anxiety and manage anxiety symptoms. Target Date: 2025-08-09Frequency: Weekly Progress: 40 Modality: individual  Related Interventions 1. Teach the client calming/relaxation skills (e.g., applied relaxation, progressive muscle  relaxation, cue controlled relaxation; mindful breathing; biofeedback) and how to discriminate  better between relaxation and tension; teach the client how to apply these skills to his/her daily  life (e.g., New Directions in Progressive Muscle Relaxation by Marcelyn Ditty, and  Hazlett-Stevens; Treating Generalized Anxiety Disorder by Rygh and Ida Rogue). Objective Identify, challenge, and replace biased, fearful self-talk with positive, realistic, and empowering selftalk. Target Date: 2024-07-18 Frequency: weekly Progress: 30 Modality: individual Related Interventions 1. Explore the client's schema and self-talk that mediate his/her fear response; assist him/her in  challenging the biases; replace the distorted messages with reality-based alternatives and  positive, realistic self-talk that will increase his/her self-confidence in coping with irrational  fears (see Cognitive Therapy of Anxiety Disorders by Laurence Slate). Objective Learn and implement problem-solving strategies for realistically addressing worries. Target Date: 2025-08-09Frequency: weekly Progress: 40 Modality: individual 2. Resolve conflicted feelings and adapt to the new life circumstances. Objective Apply problem-solving skills to current circumstances. Target Date: 2024-07-18 Frequency: weekly Progress: 20 Modality: individual Related Interventions 1. Teach the client problem-resolution skills (e.g., defining the problem clearly, brainstorming  multiple solutions, listing the pros and cons of each solution, seeking input from others,  selecting and implementing a plan of action, evaluating outcome, and readjusting plan as   necessary).   3. Stabilize anxiety level while increasing ability to function on a daily  basis. Diagnosis F33.1  Major depressive disorder, moderate 300.02 (Generalized anxiety disorder) - Open - [Signifier: n/a]  Axis  none 309.28 (Adjustment disorder with mixed anxiety and depressed  mood) - Open - [Signifier: n/a]  Adjustment Disorder,  With Anxiety   Marital conflict  Major Depressive disorder, moderate  Conditions For Discharge Achievement of treatment goals and objectives.  The patient approved this plan.   Deonna Krummel G Ethridge Sollenberger, LCSW

## 2024-09-03 ENCOUNTER — Ambulatory Visit (INDEPENDENT_AMBULATORY_CARE_PROVIDER_SITE_OTHER)
Admission: RE | Admit: 2024-09-03 | Discharge: 2024-09-03 | Disposition: A | Discharge status: Home / Self Care | Source: Ambulatory Visit | Attending: Family Medicine | Admitting: Family Medicine

## 2024-09-03 ENCOUNTER — Ambulatory Visit: Payer: Self-pay | Admitting: Family Medicine

## 2024-09-03 ENCOUNTER — Ambulatory Visit (INDEPENDENT_AMBULATORY_CARE_PROVIDER_SITE_OTHER): Admitting: Family Medicine

## 2024-09-03 ENCOUNTER — Encounter: Payer: Self-pay | Admitting: Family Medicine

## 2024-09-03 VITALS — BP 110/62 | HR 51 | Temp 98.2°F | Ht 60.0 in | Wt 168.2 lb

## 2024-09-03 DIAGNOSIS — R059 Cough, unspecified: Secondary | ICD-10-CM | POA: Diagnosis not present

## 2024-09-03 DIAGNOSIS — R058 Other specified cough: Secondary | ICD-10-CM

## 2024-09-03 DIAGNOSIS — D692 Other nonthrombocytopenic purpura: Secondary | ICD-10-CM | POA: Diagnosis not present

## 2024-09-03 DIAGNOSIS — Z23 Encounter for immunization: Secondary | ICD-10-CM | POA: Diagnosis not present

## 2024-09-03 DIAGNOSIS — R61 Generalized hyperhidrosis: Secondary | ICD-10-CM | POA: Diagnosis not present

## 2024-09-03 LAB — CBC WITH DIFFERENTIAL/PLATELET
Basophils Absolute: 0 K/uL (ref 0.0–0.1)
Basophils Relative: 0.5 % (ref 0.0–3.0)
Eosinophils Absolute: 0.1 K/uL (ref 0.0–0.7)
Eosinophils Relative: 1.4 % (ref 0.0–5.0)
HCT: 40.6 % (ref 36.0–46.0)
Hemoglobin: 13.3 g/dL (ref 12.0–15.0)
Lymphocytes Relative: 22.6 % (ref 12.0–46.0)
Lymphs Abs: 1.7 K/uL (ref 0.7–4.0)
MCHC: 32.7 g/dL (ref 30.0–36.0)
MCV: 92 fl (ref 78.0–100.0)
Monocytes Absolute: 0.6 K/uL (ref 0.1–1.0)
Monocytes Relative: 8.4 % (ref 3.0–12.0)
Neutro Abs: 4.9 K/uL (ref 1.4–7.7)
Neutrophils Relative %: 67.1 % (ref 43.0–77.0)
Platelets: 293 K/uL (ref 150.0–400.0)
RBC: 4.41 Mil/uL (ref 3.87–5.11)
RDW: 13.8 % (ref 11.5–15.5)
WBC: 7.3 K/uL (ref 4.0–10.5)

## 2024-09-03 NOTE — Assessment & Plan Note (Addendum)
 2-3 weeks Worse at night  Reassuring exam  Suspect multi factorial  Allergies/pnd GERD -sometimes worse at night On ace   Some night sweats occational w/o fever-doubt related  Cxr today   (pulm nodules watched by pulmonary as well) Reassuring   Call back and Er precautions noted in detail today    Consider change from ace to arb if ok with cardiology

## 2024-09-03 NOTE — Patient Instructions (Signed)
 Avoid foods that cause acid reflux   Chest xray today for the cough   When you have a night sweats -check your temperature to make sure no fever   I think the spots on your arms are superficial bruises from the eliquis We will check a blood count today

## 2024-09-03 NOTE — Progress Notes (Signed)
 Subjective:    Patient ID: [name] [name], female    DOB: 1956/01/19, 68 y.o.   MRN: [id number]  HPI  Wt Readings from Last 3 Encounters:  09/03/24 168 lb 4 oz (76.3 kg)  06/10/24 179 lb 2 oz (81.3 kg)  06/08/24 179 lb 6.4 oz (81.4 kg)   32.86 kg/m  Vitals:   09/03/24 0951  BP: 110/62  Pulse: (!) 51  Temp: 98.2 F (36.8 C)  SpO2: 100%    Pt presents with c/o  Skin spots on her arm  Dry cough- 2 weeks  Had some night sweats  Wants flu shot   Red spots  More on the right arm - but some on the left No pain  No itching  Little raised No trauma     Dry cough - about 3 weeks Worse at night when she lies down  No wheeze No shortness of breath  Occational clear phlegm in throat  Some runny nose  Had brief ST-thought allergies  No fever   Occational night sweats   Of note does take low dose lisinopril  (cardiology)   HTN and cardiomegally Has history of reactive airways and all rhinitis  Sees pulm for lung nodules/ being followed   No longer on asa    Flonase   Zyrtec     Anti coagulation with eliquis for a fib  Cardiology care Has to have a watchman put in her left atrium  Has appointment upcoming     Dermatology Dr Jackquline, Dr Debby  Visit in july  History of vulvar cancer (melanoma in situ)  History of lentigines, angiomas, nevi and SKs History of basal cell lesion in past  CBC    Component Value Date/Time   WBC 7.3 09/03/2024 1019   RBC 4.41 09/03/2024 1019   HGB 13.3 09/03/2024 1019   HGB 13.3 10/19/2015 0905   HCT 40.6 09/03/2024 1019   HCT 40.3 10/19/2015 0905   PLT 293.0 09/03/2024 1019   PLT 202 10/19/2015 0905   MCV 92.0 09/03/2024 1019   MCV 90 10/19/2015 0905   MCV 89 06/20/2014 1303   MCH 28.9 05/25/2024 0859   MCHC 32.7 09/03/2024 1019   RDW 13.8 09/03/2024 1019   RDW 13.8 10/19/2015 0905   RDW 13.4 06/20/2014 1303   LYMPHSABS 1.7 09/03/2024 1019   LYMPHSABS 1.3 10/19/2015 0905   MONOABS 0.6 09/03/2024 1019    EOSABS 0.1 09/03/2024 1019   EOSABS 0.0 10/19/2015 0905   BASOSABS 0.0 09/03/2024 1019   BASOSABS 0.0 10/19/2015 0905   DG Chest 2 View Result Date: 09/03/2024 EXAM: 2 VIEW(S) XRAY OF THE CHEST 09/03/2024 10:39:50 AM COMPARISON: None available. CLINICAL HISTORY: dry cough, some occasional night sweats without fever. FINDINGS: LUNGS AND PLEURA: No focal pulmonary opacity. No pulmonary edema. No pleural effusion. No pneumothorax. HEART AND MEDIASTINUM: No acute abnormality of the cardiac and mediastinal silhouettes. BONES AND SOFT TISSUES: Multilevel degenerative changes of thoracolumbar spine. Right upper quadrant surgical clips noted. IMPRESSION: 1. No acute cardiopulmonary process. 2. Incidental right upper quadrant surgical clips. 3. Multilevel degenerative changes of the thoracolumbar spine are noted without acute complication. Electronically signed by: Waddell Calk MD 09/03/2024 11:42 AM EDT RP Workstation: [id number]      Patient Active Problem List   Diagnosis Date Noted   Purpura senilis 09/03/2024   Knee pain, right 06/01/2024   Rosacea 04/02/2024   Pulmonary hypertension, unspecified (HCC) 12/31/2023   Edema of lower extremity 12/31/2023   Chronic kidney disease, stage 3a (  HCC) 12/31/2023   NSTEMI (non-ST elevated myocardial infarction) (HCC) 10/22/2023   S/P ablation of atrial fibrillation 09/30/2023   Chronic anticoagulation 09/30/2023   Pain of left hip 09/11/2023   History of anal fissures 09/09/2023   Osteopenia 07/30/2023   Headache 07/05/2023   Paroxysmal atrial fibrillation (HCC) 06/27/2023   Bradycardia 05/30/2023   Melanoma in situ (HCC) 12/25/2022   ASCUS of cervix with negative high risk HPV 06/25/2022   Osteoarthritis of knee 05/25/2022   Hemorrhoid 04/25/2022   Estrogen deficiency 01/02/2022   Bipolar disorder, in partial remission, most recent episode mixed (HCC) 02/25/2020   Dyspnea 01/19/2020   Prediabetes 03/03/2018   Atrophic vaginitis 07/08/2017    Cervical stenosis (uterine cervix) 01/03/2017   Dry cough 04/18/2016   Hearing loss 10/25/2015   Routine general medical examination at a health care facility 10/17/2015   Facial rash 02/22/2015   Colon cancer screening 10/16/2013   Enthesopathy of ankle and tarsus 12/01/2009   Enthesopathy of ankle and tarsus 12/01/2009   Asymptomatic postmenopausal status 09/30/2009   Arthropathy of pelvic region and thigh 03/10/2009   DEGENERATIVE DISC DISEASE, LUMBAR SPINE 03/10/2009   DDD (degenerative disc disease), lumbosacral 03/10/2009   ONYCHOMYCOSIS 09/17/2008   Hypothyroidism 08/05/2007   Class 2 severe obesity due to excess calories with serious comorbidity and body mass index (BMI) of 35.0 to 35.9 in adult 08/05/2007   Bipolar disorder (HCC) 08/05/2007   Allergic rhinitis 08/05/2007   Irritable bowel syndrome 08/05/2007   Esophageal reflux 08/05/2007   HYPERCHOLESTEROLEMIA, PURE 07/07/2007   Past Medical History:  Diagnosis Date   Actinic keratosis 03/29/2021   R central upper lip   Allergic rhinitis    Allergy    Anemia 10/29/2023   Anxiety    Arthritis    Atypical mole 12/26/2021   spinal upper back, exc 01/15/22   Basal cell carcinoma 10/16/2010   R lower pretibia    Bipolar 1 disorder (HCC)    Chronic kidney disease    Chronic kidney disease    Depression    Dyspnea    Fissure, anal    GERD (gastroesophageal reflux disease)    History of basal cell carcinoma excision    ON FACE   History of squamous cell carcinoma excision    ON RIGHT LEG   Hyperlipidemia    Hypothyroidism    IBS (irritable bowel syndrome)    Liver cyst    Malignant melanoma in situ (HCC) 11/20/2022   Right labia minora. Excision 12/25/2022 at anterior vulva, left of clitoral hood, margins involved. Surgery scheduled for 02/12/2023. Recurrence 12/23/2023, excised 02/11/2024, margins clear.   Mallet finger of left hand    small finger   NSTEMI (non-ST elevated myocardial infarction) (HCC) 10/22/2023    Pneumonia    Pre-diabetes    Past Surgical History:  Procedure Laterality Date   ABLATION     CARDIAC CATHETERIZATION  04/10/1999   per pt normal   CARDIOVASCULAR STRESS TEST  02-23-2011  dr vinie [facility])   normal nuclear study/  no ischemia/  ef 64%   CARDIOVERSION N/A 01/10/2024   Procedure: CARDIOVERSION;  Surgeon: Hilarie Rocher, MD;  Location: [facility];  Service: Cardiovascular;  Laterality: N/A;   CATARACT EXTRACTION W/ INTRAOCULAR LENS  IMPLANT, BILATERAL  12/10/2001   Bil   CESAREAN SECTION  12/10/1982   CLOSED REDUCTION FINGER WITH PERCUTANEOUS PINNING Left 04/22/2014   Procedure: LEFT SMALL FINGER CLOSED REDUCTION PINNING;  Surgeon: Prentice LELON Pagan, MD;  Location: [facility];  Service: Orthopedics;  Laterality: Left;   COLONOSCOPY  11/09/2002   DILATION AND CURETTAGE OF UTERUS  1977   LAPAROSCOPIC CHOLECYSTECTOMY  03/18/2000   RECONSTRUCTION OF EYELID Bilateral 08/2022   RIGHT/LEFT HEART CATH AND CORONARY ANGIOGRAPHY Bilateral 11/25/2023   Procedure: RIGHT/LEFT HEART CATH AND CORONARY ANGIOGRAPHY;  Surgeon: Florencio Cara BIRCH, MD;  Location: [facility];  Service: Cardiovascular;  Laterality: Bilateral;   TEE WITHOUT CARDIOVERSION N/A 01/10/2024   Procedure: TRANSESOPHAGEAL ECHOCARDIOGRAM (TEE);  Surgeon: Hilarie Rocher, MD;  Location: [facility];  Service: Cardiovascular;  Laterality: N/A;   TRANSTHORACIC ECHOCARDIOGRAM  02/23/2011   normal lvf/  ef 58%/  mild rve/   mild lae/  mild  mr  & tr/  mild pulmonary htn   VULVECTOMY N/A 12/25/2022   Procedure: PARTIAL  VULVECTOMY;  Surgeon: Eldonna Mays, MD;  Location: [facility];  Service: Gynecology;  Laterality: N/A;   VULVECTOMY N/A 02/12/2023   Procedure: WIDE LOCAL EXCISION OF VULVA AND REMOVAL OF CLITORIS;  Surgeon: Eldonna Mays, MD;  Location: [facility];  Service: Gynecology;  Laterality: N/A;   VULVECTOMY N/A 02/11/2024   Procedure: WIDE  EXCISION VULVECTOMY;  Surgeon: Eldonna Mays, MD;  Location: [facility];  Service: Gynecology;  Laterality: N/A;   WISDOM TOOTH EXTRACTION     Social History   Tobacco Use   Smoking status: Never   Smokeless tobacco: Never  Vaping Use   Vaping status: Never Used  Substance Use Topics   Alcohol use: Not Currently    Comment: Only on cruises or rarely   Drug use: No   Family History  Problem Relation Age of Onset   Stroke Mother    Heart disease Mother    Hypertension Father    Diabetes Father    Hyperlipidemia Father    Breast cancer Sister    Cancer Sister    Breast cancer Sister    Diabetes Brother    Alcohol abuse Sister    Arthritis Sister    Mental illness Sister    Colon cancer Neg Hx    Ovarian cancer Neg Hx    Endometrial cancer Neg Hx    Pancreatic cancer Neg Hx    Prostate cancer Neg Hx    Allergies  Allergen Reactions   Ingrezza [Valbenazine Tosylate] Other (See Comments)    Night sweats, worsened tardive dyskinesia, nightmares   Lidocaine  Swelling    ALL CAINE DRUGS EXCEPT: marcaine  & sensercaine    Xylocaine  [Lidocaine  Hcl] Swelling    ALLERGIC TO ALL CAINES EXCEPT SENSORCAINE  , AND MARCAINE    Albuterol Other (See Comments)    Leg swelling   Amantadine     Muscle pain and tremors    Austedo Xr [Deutetrabenazine]     Caused Muscle aches and tremors   Codeine Nausea And Vomiting    Vomits blood   Lithium Swelling   Singulair [Montelukast] Swelling    Leg swelling   Tegretol [Carbamazepine] Other (See Comments)    skin crawls   Current Outpatient Medications on File Prior to Visit  Medication Sig Dispense Refill   acetaminophen  (TYLENOL ) 650 MG CR tablet Take 1,300 mg by mouth every 8 (eight) hours as needed for pain.     apixaban (ELIQUIS) 5 MG TABS tablet Take 5 mg by mouth 2 (two) times daily.     ARIPiprazole  (ABILIFY ) 10 MG tablet Take 10 mg by mouth at bedtime.     Biotin 5000 MCG CAPS Take 5,000  mcg by mouth daily.     buPROPion   (WELLBUTRIN  XL) 300 MG 24 hr tablet Take 300 mg by mouth daily.     cetirizine (ZYRTEC ALLERGY) 10 MG tablet Take 10 mg by mouth daily.     cholecalciferol (VITAMIN D3) 25 MCG (1000 UNIT) tablet Take 1,000 Units by mouth daily.     clonazePAM  (KLONOPIN ) 1 MG tablet Take 1 mg by mouth at bedtime.     Coenzyme Q10 (COQ10) 100 MG CAPS Take 100 mg by mouth at bedtime.     famotidine  (PEPCID ) 20 MG tablet TAKE 1 TABLET TWICE DAILY 180 tablet 1   fluticasone  (FLONASE ) 50 MCG/ACT nasal spray Place 2 sprays into both nostrils daily. 48 g 3   JARDIANCE 10 MG TABS tablet Take 10 mg by mouth daily.     lamoTRIgine  (LAMICTAL ) 200 MG tablet Take 200 mg by mouth at bedtime.     levothyroxine  (SYNTHROID ) 50 MCG tablet TAKE 1 TABLET EVERY DAY BEFORE BREAKFAST 90 tablet 2   lisinopril  (ZESTRIL ) 2.5 MG tablet Take 1 tablet (2.5 mg total) by mouth daily. 30 tablet 0   lovastatin  (MEVACOR ) 40 MG tablet TAKE 1 TABLET EVERY DAY 90 tablet 2   nystatin  powder Apply 1 Application topically 3 (three) times daily as needed.     ramelteon (ROZEREM) 8 MG tablet Take 8 mg by mouth at bedtime.     torsemide (DEMADEX) 20 MG tablet Take 20 mg by mouth daily.     zolpidem  (AMBIEN ) 10 MG tablet Take 5 mg by mouth at bedtime.     No current facility-administered medications on file prior to visit.    Review of Systems  Constitutional:  Negative for activity change, appetite change, fatigue, fever and unexpected weight change.  HENT:  Negative for congestion, ear pain, rhinorrhea, sinus pressure and sore throat.   Eyes:  Negative for pain, redness and visual disturbance.  Respiratory:  Positive for cough. Negative for shortness of breath and wheezing.   Cardiovascular:  Negative for chest pain and palpitations.  Gastrointestinal:  Negative for abdominal pain, blood in stool, constipation and diarrhea.  Endocrine: Negative for polydipsia and polyuria.       Occational night sweat w/o fever   Genitourinary:  Negative for  dysuria, frequency and urgency.  Musculoskeletal:  Negative for arthralgias, back pain and myalgias.  Skin:  Negative for pallor and rash.       Red spots on arms    Recently- more treatment for vulvar skin cancer   Allergic/Immunologic: Negative for environmental allergies.  Neurological:  Negative for dizziness, syncope and headaches.  Hematological:  Negative for adenopathy. Does not bruise/bleed easily.  Psychiatric/Behavioral:  Negative for decreased concentration and dysphoric mood. The patient is not nervous/anxious.        Objective:   Physical Exam Constitutional:      General: She is not in acute distress.    Appearance: Normal appearance. She is well-developed. She is obese. She is not ill-appearing or diaphoretic.  HENT:     Head: Normocephalic and atraumatic.     Mouth/Throat:     Mouth: Mucous membranes are moist.  Eyes:     Conjunctiva/sclera: Conjunctivae normal.     Pupils: Pupils are equal, round, and reactive to light.  Neck:     Thyroid : No thyromegaly.     Vascular: No carotid bruit or JVD.  Cardiovascular:     Rate and Rhythm: Normal rate and regular rhythm.     Heart sounds: Normal  heart sounds.     No gallop.  Pulmonary:     Effort: Pulmonary effort is normal. No respiratory distress.     Breath sounds: Normal breath sounds. No stridor. No wheezing, rhonchi or rales.     Comments: Good air exch  Chest:     Chest wall: No tenderness.  Abdominal:     General: There is no distension or abdominal bruit.     Palpations: Abdomen is soft.  Musculoskeletal:     Cervical back: Normal range of motion and neck supple.     Right lower leg: No edema.     Left lower leg: No edema.  Lymphadenopathy:     Cervical: No cervical adenopathy.  Skin:    General: Skin is warm and dry.     Coloration: Skin is not jaundiced or pale.     Findings: Bruising present. No rash.     Comments: Fair   Scattered erythematous macular spots on forearms / randomly scattered ,  not raised and do not blanche ,non tender     Neurological:     Mental Status: She is alert.     Coordination: Coordination normal.     Deep Tendon Reflexes: Reflexes are normal and symmetric. Reflexes normal.  Psychiatric:        Mood and Affect: Mood normal.           Assessment & Plan:   Problem List Items Addressed This Visit       Cardiovascular and Mediastinum   Purpura senilis   Areas of non raised superficial bruising/macula  on both forearms in setting of eliquis No longer on asa  No specific trauma   Cbc today  Suspect worsened by age/ sun exp and anti coagulation       Relevant Orders   CBC with Differential/Platelet (Completed)     Other   Dry cough - Primary   2-3 weeks Worse at night  Reassuring exam  Suspect multi factorial  Allergies/pnd GERD -sometimes worse at night On ace   Some night sweats occational w/o fever-doubt related  Cxr today   (pulm nodules watched by pulmonary as well) Reassuring   Call back and Er precautions noted in detail today        Relevant Orders   DG Chest 2 View (Completed)   Other Visit Diagnoses       Need for influenza vaccination       Relevant Orders   Flu vaccine HIGH DOSE PF(Fluzone Trivalent) (Completed)

## 2024-09-03 NOTE — Assessment & Plan Note (Signed)
 Areas of non raised superficial bruising/macula  on both forearms in setting of eliquis No longer on asa  No specific trauma   Cbc today  Suspect worsened by age/ sun exp and anti coagulation

## 2024-09-04 DIAGNOSIS — H903 Sensorineural hearing loss, bilateral: Secondary | ICD-10-CM | POA: Diagnosis not present

## 2024-09-07 ENCOUNTER — Inpatient Hospital Stay: Attending: Psychiatry | Admitting: Psychiatry

## 2024-09-07 ENCOUNTER — Encounter: Payer: Self-pay | Admitting: Psychiatry

## 2024-09-07 VITALS — BP 121/63 | HR 71 | Temp 99.2°F | Resp 20 | Wt 169.0 lb

## 2024-09-07 DIAGNOSIS — L819 Disorder of pigmentation, unspecified: Secondary | ICD-10-CM

## 2024-09-07 DIAGNOSIS — N9089 Other specified noninflammatory disorders of vulva and perineum: Secondary | ICD-10-CM | POA: Diagnosis not present

## 2024-09-07 DIAGNOSIS — C519 Malignant neoplasm of vulva, unspecified: Secondary | ICD-10-CM

## 2024-09-07 DIAGNOSIS — Z86002 Personal history of in-situ neoplasm of other and unspecified genital organs: Secondary | ICD-10-CM | POA: Insufficient documentation

## 2024-09-07 NOTE — Progress Notes (Signed)
 Gynecologic Oncology Return Clinic Visit  Date of Service: 09/07/2024 Referring Provider: Harland Birkenhead, MD   Assessment & Plan: [name] [name] is a 68 y.o. woman with recurrent vulvar melanoma in situ who is s/p repeat wide local excision of the vulva on 02/11/24 (previously with excisions on 12/25/22 and 02/12/23). No residual disease on excision. Biopsies at last visit on 06/08/24 benign. Presents today for surveillance.  Melanoma in situ: - From original excisions in 2024, focal positive deep margin (one focal spot on 1 single slide). All other margins negative for MIS but positive for atypical lentiginous melanocytic hyperplasia (possible precursor lesion). - No residual melanoma in situ on excision from 02/11/24.  - Last visit on 06/08/24 with 2 biopsies, both benign (mild hyperpigmentation, no atypical melanocytic proliferation).  - See by [facility] for 2nd opinion on 06/16/24. Recommend consideration of Moh's if recurrence. They noted that aldara can make it harder to perform Moh's. - Two biopsies today, follow-up results. - Continue surveillance with pelvic exam q2mo with biopsies as indicated.   RTC 71mo  [name] Masters, MD Gynecologic Oncology   Medical Decision Making I personally spent  TOTAL 30 minutes face-to-face and non-face-to-face in the care of this patient, which includes all pre, intra, and post visit time on the date of service.    ----------------------- Reason for Visit: Surveillance  Treatment History: Pt presented to her Ob/Gyn for evaluation of vulvar itching. She had noted itching the week prior, but this resolved with application of clobetasol  which she had for a diagnosis of lichen simplex chronicus. However, patient had also been previously noted by another provider to have a brown vulvar lesions of her right labia minora 12/2021 which was rechecked 03/2022 and noted to be stable with plan for continued yearly follow-up.    On 11/12/22, her Ob/Gyn noted a 1cm  brown lesion was noted at the top of her labia minora. Given the appearance of a melanotic lesion, she was recommended to return for a biopsy, which was completed on 11/20/22. Biopsy returned with melanoma in situ, edges involved. No invasive disease was seen.    She underwent a simple partial vulvectomy on 12/25/22 with path results: A. ANTERIOR VULVA, EXCISION: MELANOMA IN SITU, EXTENDING TO THE DEEP  SURGICAL MARGIN. THE PERIPHERAL MARGINS ARE NEGATIVE.  B. ANTERIOR VULVA, LEFT OF CLITORAL HOOD, EXCISION:  MELANOMA IN SITU  INVOLVING THE 12, 3, 6 AND 9 O'CLOCK MARGINS.    Given multiple positive margins, she returned to the OR on 02/12/23 for additional resection including a WLE of the anterior vulva including removal of remaining bilateral labia minora and removal of clitoris with pathology showing: A.   VULVA, ANTERIOR MARGIN, BIOPSY: NO MELANOMA IN SITU SEEN. COMMENT: Dr. Lorriane is in agreement. B.   VULVA, LEFT ANTERIOR MARGIN, BIOPSY: NO MELANOMA IN SITU SEEN. COMMENT: Dr. Lorriane is in agreement. C.   VULVA, LEFT MEDIAL MARGIN, BIOPSY: NO MELANOMA IN SITU SEEN. COMMENT: MelanA was used to evaluate this slide. Dr. Lorriane is in agreement D.   **PRELIMINARY DIAGNOSIS** VULVA, ANTERIOR WITH LEFT/RIGHT LABIA MINORA, EXCISION: -RESIDUAL MELANOMA IN SITU , MULTFOCAL RESIDUAL FOCI , INVOLVING BLACK/DEEP MARGIN (D2), -BACKGROUND ATYPICAL JUNCTIONAL MELANOCYTIC HYPERPLASIA INVOLVING MULTIFOCAL MARGINS (ANTERIOR, OUTER LEFT, INNER LEFT MARGINS INVOLVED WITH ATYPICAL JUNCTIONAL MELANOCYTIC HYPERPLASIA, SEE COMMENT) COMMENT: Sections show extensive areas of atypical lentiginous melanocytic hyperplasia involving the margins noted above, which may be a precursor lesion to the residual melanoma in situ present. The atypical lentiginous melanocytic hyperplasia does not meet  criteria for melanoma in situ. These atypical lentiginous foci are negative with PRAME, and exhibit lesser cytological atypia on  MITF staining. MelanA overstains many foci and is not contributory. However, residual melanoma in situ with focal upward (pagetoid) growth, severe atypia of melanocytes, and melanocytic confluence is seen in multifocal areas, but notably ONLY at the margin deep inked black (block D2). The remainder of the margins are free of melanoma in situ.  E.   VULVA, LEFT LATERAL MARGIN, EXCISION: ATYPICAL JUNCTIONAL MELANOCYTIC HYPERPLASIA, NO MELANOMA IN SITU COMMENT: No melanoma in situ is seen. PRAME, MelanA, and MITF were used to evaluate this margin. Dr. Lorriane has reviewed this case and is in agreement with the diagnoses rendered above. In managing this patient with extensive atypical lentiginous melanocytic hyperplasia, which likely represents a confluence of extensive melanotic macules and precursor disease to melanoma in situ, this reference article is provided: Oneda Betty HERO. May P. Candyce, Shitanshu Uppai: Margin status in vulvovaginal melanoma: Management and oncologic outcomes of 50 cases. Gynecol Oncol Rep. 2023 Oct; 49: [id number]    Scouting biopsies on 06/03/23: negative Scouting biopsies 10/14/254: lichenoid dermatitis Biopsies 12/23/23: melanoma in situ (left), atypical junctional melanocytic proliferation (right) Vulvectomy 02/11/24: negative Biopsies 06/08/24: mild hyperpigmentation of epithelium, no atypical melanocytic proliferation.   Interval History: Patient presents with her husband today.  She reports that she has follow-up with cardiology tomorrow  to discuss scheduling procedure for a cardiac device in the setting of paroxysmal atrial fibrillation.  She reports that they tried to perform an ablation on July 30 but based on their findings at that time no ablation was performed.  Otherwise, patient reports that she has a new spot on her vulva that she believes was first noted 1 to 2 months ago.  Not changing at this time.  Denies any bleeding.   Past Medical/Surgical  History: Past Medical History:  Diagnosis Date   Actinic keratosis 03/29/2021   R central upper lip   Allergic rhinitis    Allergy    Anemia 10/29/2023   Anxiety    Arthritis    Atypical mole 12/26/2021   spinal upper back, exc 01/15/22   Basal cell carcinoma 10/16/2010   R lower pretibia    Bipolar 1 disorder (HCC)    Chronic kidney disease    Chronic kidney disease    Depression    Dyspnea    Fissure, anal    GERD (gastroesophageal reflux disease)    History of basal cell carcinoma excision    ON FACE   History of squamous cell carcinoma excision    ON RIGHT LEG   Hyperlipidemia    Hypothyroidism    IBS (irritable bowel syndrome)    Liver cyst    Malignant melanoma in situ (HCC) 11/20/2022   Right labia minora. Excision 12/25/2022 at anterior vulva, left of clitoral hood, margins involved. Surgery scheduled for 02/12/2023. Recurrence 12/23/2023, excised 02/11/2024, margins clear.   Mallet finger of left hand    small finger   NSTEMI (non-ST elevated myocardial infarction) (HCC) 10/22/2023   Pneumonia    Pre-diabetes     Past Surgical History:  Procedure Laterality Date   ABLATION     CARDIAC CATHETERIZATION  04/10/1999   per pt normal   CARDIOVASCULAR STRESS TEST  02-23-2011  dr vinie fath [facility] clinic)   normal nuclear study/  no ischemia/  ef 64%   CARDIOVERSION N/A 01/10/2024   Procedure: CARDIOVERSION;  Surgeon: Hilarie Rocher, MD;  Location: [facility] ORS;  Service:  Cardiovascular;  Laterality: N/A;   CATARACT EXTRACTION W/ INTRAOCULAR LENS  IMPLANT, BILATERAL  12/10/2001   Bil   CESAREAN SECTION  12/10/1982   CLOSED REDUCTION FINGER WITH PERCUTANEOUS PINNING Left 04/22/2014   Procedure: LEFT SMALL FINGER CLOSED REDUCTION PINNING;  Surgeon: Prentice LELON Pagan, MD;  Location: [facility];  Service: Orthopedics;  Laterality: Left;   COLONOSCOPY  11/09/2002   DILATION AND CURETTAGE OF UTERUS  1977   LAPAROSCOPIC CHOLECYSTECTOMY  03/18/2000    RECONSTRUCTION OF EYELID Bilateral 08/2022   RIGHT/LEFT HEART CATH AND CORONARY ANGIOGRAPHY Bilateral 11/25/2023   Procedure: RIGHT/LEFT HEART CATH AND CORONARY ANGIOGRAPHY;  Surgeon: Florencio Cara BIRCH, MD;  Location: [facility] INVASIVE CV LAB;  Service: Cardiovascular;  Laterality: Bilateral;   TEE WITHOUT CARDIOVERSION N/A 01/10/2024   Procedure: TRANSESOPHAGEAL ECHOCARDIOGRAM (TEE);  Surgeon: Hilarie Rocher, MD;  Location: [facility] ORS;  Service: Cardiovascular;  Laterality: N/A;   TRANSTHORACIC ECHOCARDIOGRAM  02/23/2011   normal lvf/  ef 58%/  mild rve/   mild lae/  mild  mr  & tr/  mild pulmonary htn   VULVECTOMY N/A 12/25/2022   Procedure: PARTIAL  VULVECTOMY;  Surgeon: Eldonna Mays, MD;  Location: [facility];  Service: Gynecology;  Laterality: N/A;   VULVECTOMY N/A 02/12/2023   Procedure: WIDE LOCAL EXCISION OF VULVA AND REMOVAL OF CLITORIS;  Surgeon: Eldonna Mays, MD;  Location: [facility];  Service: Gynecology;  Laterality: N/A;   VULVECTOMY N/A 02/11/2024   Procedure: WIDE EXCISION VULVECTOMY;  Surgeon: Eldonna Mays, MD;  Location: [facility];  Service: Gynecology;  Laterality: N/A;   WISDOM TOOTH EXTRACTION      Family History  Problem Relation Age of Onset   Stroke Mother    Heart disease Mother    Hypertension Father    Diabetes Father    Hyperlipidemia Father    Breast cancer Sister    Cancer Sister    Breast cancer Sister    Diabetes Brother    Alcohol abuse Sister    Arthritis Sister    Mental illness Sister    Colon cancer Neg Hx    Ovarian cancer Neg Hx    Endometrial cancer Neg Hx    Pancreatic cancer Neg Hx    Prostate cancer Neg Hx     Social History   Socioeconomic History   Marital status: Married    Spouse name: Not on file   Number of children: 1   Years of education: Not on file   Highest education level: Bachelor's degree (e.g., BA, AB, BS)  Occupational History   Occupation: Home    Employer: RETIRED  Tobacco  Use   Smoking status: Never   Smokeless tobacco: Never  Vaping Use   Vaping status: Never Used  Substance and Sexual Activity   Alcohol use: Not Currently    Comment: Only on cruises or rarely   Drug use: No   Sexual activity: Not Currently    Birth control/protection: Post-menopausal  Other Topics Concern   Not on file  Social History Narrative   Moved here from [place]   Social Drivers of Health   Financial Resource Strain: Low Risk  (05/24/2024)   Overall Financial Resource Strain (CARDIA)    Difficulty of Paying Living Expenses: Not hard at all  Food Insecurity: No Food Insecurity (05/24/2024)   Hunger Vital Sign    Worried About Running Out of Food in the Last Year: Never true    Ran Out of Food in the  Last Year: Never true  Transportation Needs: No Transportation Needs (05/24/2024)   PRAPARE - Administrator, Civil Service (Medical): No    Lack of Transportation (Non-Medical): No  Physical Activity: Sufficiently Active (05/24/2024)   Exercise Vital Sign    Days of Exercise per Week: 3 days    Minutes of Exercise per Session: 50 min  Stress: Stress Concern Present (05/24/2024)   [name] [name] of Occupational Health - Occupational Stress Questionnaire    Feeling of Stress: To some extent  Social Connections: Socially Integrated (05/24/2024)   Social Connection and Isolation Panel    Frequency of Communication with Friends and Family: More than three times a week    Frequency of Social Gatherings with Friends and Family: Once a week    Attends Religious Services: More than 4 times per year    Active Member of [employer] or Organizations: Yes    Attends Engineer, structural: More than 4 times per year    Marital Status: Married    Current Medications:  Current Outpatient Medications:    acetaminophen  (TYLENOL ) 650 MG CR tablet, Take 1,300 mg by mouth every 8 (eight) hours as needed for pain., Disp: , Rfl:    apixaban (ELIQUIS) 5 MG TABS tablet, Take 5 mg by  mouth 2 (two) times daily., Disp: , Rfl:    ARIPiprazole  (ABILIFY ) 10 MG tablet, Take 10 mg by mouth at bedtime., Disp: , Rfl:    Biotin 5000 MCG CAPS, Take 5,000 mcg by mouth daily., Disp: , Rfl:    buPROPion  (WELLBUTRIN  XL) 300 MG 24 hr tablet, Take 300 mg by mouth daily., Disp: , Rfl:    cetirizine (ZYRTEC ALLERGY) 10 MG tablet, Take 10 mg by mouth daily., Disp: , Rfl:    cholecalciferol (VITAMIN D3) 25 MCG (1000 UNIT) tablet, Take 1,000 Units by mouth daily., Disp: , Rfl:    clonazePAM  (KLONOPIN ) 1 MG tablet, Take 1 mg by mouth at bedtime., Disp: , Rfl:    Coenzyme Q10 (COQ10) 100 MG CAPS, Take 100 mg by mouth at bedtime., Disp: , Rfl:    famotidine  (PEPCID ) 20 MG tablet, TAKE 1 TABLET TWICE DAILY, Disp: 180 tablet, Rfl: 1   fluticasone  (FLONASE ) 50 MCG/ACT nasal spray, Place 2 sprays into both nostrils daily., Disp: 48 g, Rfl: 3   JARDIANCE 10 MG TABS tablet, Take 10 mg by mouth daily., Disp: , Rfl:    lamoTRIgine  (LAMICTAL ) 200 MG tablet, Take 200 mg by mouth at bedtime., Disp: , Rfl:    levothyroxine  (SYNTHROID ) 50 MCG tablet, TAKE 1 TABLET EVERY DAY BEFORE BREAKFAST, Disp: 90 tablet, Rfl: 2   lisinopril  (ZESTRIL ) 2.5 MG tablet, Take 1 tablet (2.5 mg total) by mouth daily., Disp: 30 tablet, Rfl: 0   lovastatin  (MEVACOR ) 40 MG tablet, TAKE 1 TABLET EVERY DAY, Disp: 90 tablet, Rfl: 2   nystatin  powder, Apply 1 Application topically 3 (three) times daily as needed., Disp: , Rfl:    ramelteon (ROZEREM) 8 MG tablet, Take 8 mg by mouth at bedtime., Disp: , Rfl:    torsemide (DEMADEX) 20 MG tablet, Take 20 mg by mouth daily., Disp: , Rfl:    zolpidem  (AMBIEN ) 10 MG tablet, Take 5 mg by mouth at bedtime., Disp: , Rfl:   Review of Symptoms: Complete 10-system review is positive for: Chest pain, cough  Physical Exam: There were no vitals taken for this visit. General: Alert, oriented, no acute distress. HEENT: Normocephalic, atraumatic. Neck symmetric without masses. Sclera anicteric.  Chest:  Normal work of breathing.  Extremities: Grossly normal range of motion.  Warm, well perfused.  Edema bilateral ankles Skin: No rashes or lesions noted. GU: External genitalia with well healed anterior vulvar resection.  Patient used mirror to note areas she has seen.  These appear consistent with benign vascular changes with purple raised punctate lesions.  Otherwise 1 subtle pinpoint brown lesion adjacent to a punctate vascular lesion of the left anterior labia majora.  Additionally, subtle darkening of skin to the left of the anteriormost anterior scar. Exam chaperoned by Kimberly Swaziland, CMA   VULVAR BIOPSY  COREAN YOSHIMURA is a 68 y.o. woman who presents today for a vulvar biopsy, see details of the visit above.  The procedure was explained to the patient and verbal consent was obtained prior to the procedure.  The skin was cleaned with Betadine.  1 ml of 0.25% marcaine  was injected at the planned biopsy sites.  A 3 mm punch biopsy was used to obtain the biopsy specimen at each site noted above.  Hemostasis was achieved with silver nitrate and one figure of eight stitch for the left labia major anterior biopsy. Silver nitrate alone was sufficient for the anterior vulvar biopsy.  Patient tolerated the procedure well.    Laboratory & Radiologic Studies: Surgical pathology (06/08/24): A.   LEFT SUPERIOR ANTERIOR VULVA BIOPSY:  - MILD HYPERPIGMENTATION OF EPITHELIUM, SEE NOTE:   NOTE: The histological findings can be seen in a benign labial melanotic  macule. No atypical melanocytic proliferation is seen on Melan-A or  Sox-10 staining. PAS stain is negative for fungal organisms.   B. LEFT MID ANTERIOR VULVA BIOPSY:  - MILD HYPERPIGMENTATION OF EPITHELIUM, SEE NOTE:   NOTE: The histological findings can be seen in a benign labial melanotic  macule. No atypical melanocytic proliferation is seen on Melan-A or  Sox-10 staining. PAS stain is negative for fungal organisms.

## 2024-09-07 NOTE — Patient Instructions (Signed)
 It was a pleasure to see you in clinic today. - We will let you know the biopsy results when available - Return visit planned for 3 months unless indicated sooner  Thank you very much for allowing me to provide care for you today.  I appreciate your confidence in choosing our Gynecologic Oncology team at [facility].  If you have any questions about your visit today please call our office or send us  a MyChart message and we will get back to you as soon as possible.

## 2024-09-08 DIAGNOSIS — I480 Paroxysmal atrial fibrillation: Secondary | ICD-10-CM | POA: Diagnosis not present

## 2024-09-08 DIAGNOSIS — I4891 Unspecified atrial fibrillation: Secondary | ICD-10-CM | POA: Diagnosis not present

## 2024-09-09 LAB — SURGICAL PATHOLOGY

## 2024-09-21 ENCOUNTER — Ambulatory Visit: Payer: Self-pay | Admitting: Psychiatry

## 2024-09-22 ENCOUNTER — Ambulatory Visit: Admitting: Psychology

## 2024-09-23 DIAGNOSIS — I480 Paroxysmal atrial fibrillation: Secondary | ICD-10-CM | POA: Diagnosis not present

## 2024-09-23 DIAGNOSIS — F3177 Bipolar disorder, in partial remission, most recent episode mixed: Secondary | ICD-10-CM | POA: Diagnosis not present

## 2024-09-23 DIAGNOSIS — Z006 Encounter for examination for normal comparison and control in clinical research program: Secondary | ICD-10-CM | POA: Diagnosis not present

## 2024-09-23 DIAGNOSIS — E039 Hypothyroidism, unspecified: Secondary | ICD-10-CM | POA: Diagnosis not present

## 2024-09-23 DIAGNOSIS — E7800 Pure hypercholesterolemia, unspecified: Secondary | ICD-10-CM | POA: Diagnosis not present

## 2024-09-23 DIAGNOSIS — I4892 Unspecified atrial flutter: Secondary | ICD-10-CM | POA: Diagnosis not present

## 2024-09-23 DIAGNOSIS — I129 Hypertensive chronic kidney disease with stage 1 through stage 4 chronic kidney disease, or unspecified chronic kidney disease: Secondary | ICD-10-CM | POA: Diagnosis not present

## 2024-09-23 DIAGNOSIS — N1831 Chronic kidney disease, stage 3a: Secondary | ICD-10-CM | POA: Diagnosis not present

## 2024-09-23 DIAGNOSIS — K219 Gastro-esophageal reflux disease without esophagitis: Secondary | ICD-10-CM | POA: Diagnosis not present

## 2024-09-23 NOTE — Discharge Summary (Signed)
 [facility]                      Electrophysiology Discharge Summary   Admit Date: 09/23/2024  Discharge Date: 09/24/2024  Admitting Physician: Franky Morna Ned, MD  Discharge Physician: EZZARD LAMAR POUR, MD  Primary Care Provider: Randeen, Laine Caldron, MD  Primary Electrophysiologist: Boone Morel, MD   Discharge Destination: Home  Discharge Services: none  Code Status: Prior   Admission Diagnoses:  Paroxysmal atrial fibrillation (CMS/HHS-HCC) [I48.0] S/P ablation of atrial fibrillation [Z98.890, Z86.79] Chronic anticoagulation [Z79.01] Preprocedural examination [Z01.818]  Discharge Diagnoses:  Principal Problem:   Paroxysmal atrial fibrillation (CMS/HHS-HCC) Resolved Problems:   * No resolved hospital problems. *     Anticipatory Guidance (key med changes, results pending, future labs, IV therapies):   - s/p Watchman implant - Eliquis 5 mg BID until TEE follow-up - TEE to be scheduled by outpatient team - EP follow-up 1/14 as below    Cardiac Rehab: not indicated for this procedure  Patient Discharge Instructions:   Other activity instructions:  Order Comments: See attached instructions.   May shower, but no soaking tub baths  Order Comments: See attached instructions.   If you smoke (or have smoked within the last year), we strongly recommend that you do not smoke.   Regular   Notify cardiology provider of chest pain   Notify provider of swelling in arms, legs, or stomach   Notify Provider of bleeding or expanding swelling at groin catheter access site   Notify provider temperature greater than 101.0 F (38.3 C) degrees   Notify provider of dizziness or passing out   Notify provider of weight gain greater than 2 lbs in 1 day or 5 lbs in 1 week   Notify provider of difficulty breathing or shortness of breath   Report questions or concerns to the [facility] at [phone]   Notify primary care physician  of other symptoms   For a life-threatening emergency, call 911   Follow-up with Primary Care Provider   Follow-up with Cardiology    [facility] Provider Follow-up: Future Appointments  Date Time Provider Department Center  09/30/2024  9:30 AM [facility] 4 CARD HOME MONITOR HU ARR [facility]ARD 4 [facility]  09/30/2024  9:45 AM [facility] 4 CARD HOME MONITOR HU ARR CARD 4 [facility]  11/11/2024 11:00 AM Callwood, Cara Endow, MD [facility] MARY[facility] C  12/23/2024  9:40 AM Morel Boone Skates, MD ARR [facility] 2 [facility]  02/24/2025  9:00 AM Theotis Lavelle BRAVO, MD [facility] MARYL BROCKS    Non-[facility] Provider Follow-up: none  Report Issues: By using [facility], or by calling the [facility] at [phone].  For urgent issues or after business hours (after 5pm on weekdays and anytime on weekends), call the [facility] Operator [phone] and ask to page the on-call cardiologist.    Allergies/Intolerances:  Allergies  Allergen Reactions  . Caine-1 [Lidocaine  Hcl] Swelling    Patterson family (can have marcaine  and sensorcaine )  Per patient, marcaine  and sensorcaine  ok.  . Amantadine Other (See Comments)  . Carbamazepine Unknown    REACTION: skin crawls  . Codeine Nausea And Vomiting    REACTION: vomits blood  . Deutetrabenazine Other (See Comments)    Caused Muscle aches and tremors  . Lidocaine  Dermatitis    Swelling  . Lithium Diarrhea  and Swelling     Medications:     Discharge Medications     Medications To Continue      Details  ARIPiprazole  10 MG tablet Commonly known as: ABILIFY   1 tablet, Daily Refills: 0   BIOTIN ORAL  Daily Refills: 0   buPROPion  300 MG XL tablet Commonly known as: WELLBUTRIN  XL  300 mg, Daily Refills: 0   cetirizine 10 MG tablet Commonly known as: ZyrTEC  10 mg, Daily Refills: 0   cholecalciferol 2,000 unit capsule Commonly known as: VITAMIN D3  2,000 Units, Daily Refills: 0   clonazePAM  1 MG tablet Commonly known as: KlonoPIN   1 mg,  Nightly Refills: 0   ELIQUIS 5 mg tablet Generic drug: apixaban  5 mg, Oral, Every 12 hours Quantity: 180 tablet Refills: 3   famotidine  20 MG tablet Commonly known as: PEPCID   20 mg, 2 times Daily Refills: 0   fluticasone  propionate 50 mcg/actuation nasal spray Commonly known as: FLONASE   2 sprays, As needed Refills: 0   JARDIANCE 10 mg tablet Generic drug: empagliflozin  10 mg, Daily Refills: 0   lamoTRIgine  150 MG tablet Commonly known as: LaMICtal   200 mg, Nightly Refills: 0   levothyroxine  50 MCG tablet Commonly known as: SYNTHROID   50 mcg, Daily Refills: 0   lisinopriL  2.5 MG tablet Commonly known as: ZESTRIL   2.5 mg, Oral, Daily Quantity: 90 tablet Refills: 3   lovastatin  40 MG tablet Commonly known as: MEVACOR   40 mg, Nightly Refills: 0   TORsemide 20 MG tablet Commonly known as: DEMADEX Start taking on: September 25, 2024  20 mg, Oral, Three times Weekly, Per nephrology, decreased to 3-4 times per week Refills: 0          Anticoagulation: Prescribed INR goal: Not applicable   Brief History of Present Illness: Per the H&P dated on 09/23/2024: 68 yo female with pAF, melanoma, obesity, bipolar, recent PVI for afib noted to have no electrical activity in her LAA (07/08/24 [place]) and here for watchman placement.  __________  Hospital Course by Problem:   On 09/23/2024, Dr. Debby and Dr. Weyman proceeded with implantation of left atrial appendage closure device. Please see operative note for details. Patient tolerated the procedure well and had no immediate post-procedure complications. 24 hour telemetry showed SB/SR with PACs, HR 40-50s. At time of discharge, patient appeared euvolemic without complaints of chest pain, SOB, N/V, F/C, urinary retention, and no evidence of groin bleed. Discharged to home in stable condition.   Comorbid Conditions:           Imaging and Procedures Performed:   Op note 09/23/2024: Summary: Sinus bradycardia  on arrival   Successful transseptal puncture x 1 with ICE guidance  Successful implant of 27 mm Watchman FLX Pro device   Recommendations: Bedrest x 4 hours Remove Figure of 8 stitch at end of bedrest Anti-thrombotic: apixaban 5 mg BID until follow up Discharge Plan: tomorrow EP f/u to be scheduled  _____________________  Discharge Exam:  Admission Weight: 75.9 kg (167 lb 5.3 oz)  Discharge Weight: Weight: 75.9 kg (167 lb 5.3 oz) BMI: Body mass index is 32.68 kg/m. BP 125/48   Pulse (!) 45   Temp 36.6 C (97.9 F) (Oral)   Resp 18   Ht 152.4 cm (5')   Wt 75.9 kg (167 lb 5.3 oz)   SpO2 100%   BMI 32.68 kg/m   General: alert, cooperative, and in NAD Respiratory: regular rate, symmetric, unlabored, clear to auscultation bilaterally, and  no accessory muscle use Cardiac: bradycardic, regular rhythm, S1, S2 present, no murmur, no rub, no gallop, and JVD non-elevated Abdomen: normal bowel sounds, soft, nontender, and nondistended Extremities: extremities warm and well perfused, no clubbing or cyanosis, no edema, and distal pulses intact right groin site stable with good pulses, no hematoma or bruit Lines: none  Pertinent Lab Testing:  BMP: Recent Labs  Lab 09/24/24 0505  NA 136  K 3.9  CL 103  CO2 25  BUN 9  CREATININE 1.0  GLUCOSE 122  CALCIUM 8.8  MG 1.9   CBC: Recent Labs  Lab 09/24/24 0505  WBC 7.9  HGB 11.1*  HCT 34.4*  PLT 230   INR: Recent Labs  Lab 09/23/24 0647  INR 1.5*    TFTs: No results for input(s): TSH, T4FREE in the last 168 hours.       Other Pertinent Labs:  None  _____________________  Time spent on discharge process: >30 minutes  [name] CASTILE MARTWICK, NP  ------------------------------------------------------------------------------- Attestation signed by Ezzard Lamar Kent, MD at 09/24/2024  3:32 PM Attestation Statement:   I personally saw the patient and performed a substantive portion of the medical decision  making, in conjunction with the Advanced Practice Provider for the condition/treatment of WATCHMAN implant  ROBERT KENT EZZARD, MD  -------------------------------------------------------------------------------

## 2024-09-25 ENCOUNTER — Other Ambulatory Visit: Payer: Self-pay

## 2024-09-25 ENCOUNTER — Emergency Department
Admission: EM | Admit: 2024-09-25 | Discharge: 2024-09-25 | Disposition: A | Discharge status: Home / Self Care | Attending: Emergency Medicine | Admitting: Emergency Medicine

## 2024-09-25 ENCOUNTER — Emergency Department

## 2024-09-25 DIAGNOSIS — K5900 Constipation, unspecified: Secondary | ICD-10-CM | POA: Insufficient documentation

## 2024-09-25 DIAGNOSIS — R109 Unspecified abdominal pain: Secondary | ICD-10-CM | POA: Diagnosis not present

## 2024-09-25 DIAGNOSIS — R110 Nausea: Secondary | ICD-10-CM | POA: Insufficient documentation

## 2024-09-25 DIAGNOSIS — R1030 Lower abdominal pain, unspecified: Secondary | ICD-10-CM | POA: Diagnosis present

## 2024-09-25 NOTE — ED Notes (Signed)
 Pt ambulated to the restroom and requested to sit on the toilet for a bit. She was instructed to pull call bell when finished.

## 2024-09-25 NOTE — ED Provider Notes (Signed)
 [facility] Provider Note    Event Date/Time   First MD Initiated Contact with Patient 09/25/24 1146     (approximate)   History   Constipation   HPI  [name] [name] is a 68 y.o. female with PMH of BS, A-fib with recent surgery for Watchman device placement 2 days ago presents for evaluation of constipation.  Patient states that her last bowel movement was the 13th.  She is now having a lot of lower abdominal and rectal pain.  Patient states she has tried a few over-the-counter treatments including prunes, MiraLAX, Senokot and milk of magnesia.  She endorses some nausea but no vomiting.  She has not had any liquid stool leakage.      Physical Exam   Triage Vital Signs: ED Triage Vitals [09/25/24 1136]  Encounter Vitals Group     BP 134/74     Girls Systolic BP Percentile      Girls Diastolic BP Percentile      Boys Systolic BP Percentile      Boys Diastolic BP Percentile      Pulse Rate 70     Resp 18     Temp 98.4 F (36.9 C)     Temp src      SpO2 98 %     Weight      Height      Head Circumference      Peak Flow      Pain Score 7     Pain Loc      Pain Education      Exclude from Growth Chart     Most recent vital signs: Vitals:   09/25/24 1136 09/25/24 1150  BP: 134/74   Pulse: 70   Resp: 18   Temp: 98.4 F (36.9 C)   SpO2: 98% 100%   General: Awake, no distress.  CV:  Good peripheral perfusion.  RRR. Resp:  Normal effort.  CTAB. Abd:  No distention. TTP along the large intestine, worst in the LLQ Other:  Soft stool felt on DRE   ED Results / Procedures / Treatments   Labs (all labs ordered are listed, but only abnormal results are displayed) Labs Reviewed - No data to display   RADIOLOGY  Abdominal x-ray obtained, interpreted the images as well as reviewed the radiologist report which did not show an obstructive bowel gas pattern but did show moderate stool burden.  PROCEDURES:  Critical Care performed:  No  Procedures   MEDICATIONS ORDERED IN ED: Medications - No data to display   IMPRESSION / MDM / ASSESSMENT AND PLAN / ED COURSE  I reviewed the triage vital signs and the nursing notes.                             68 year old female presents for evaluation of constipation.  Vital signs are stable, patient NAD on exam.  Differential diagnosis includes, but is not limited to, constipation, fecal impaction, inflammatory bowel, IBS, less likely bowel obstruction.  Patient's presentation is most consistent with acute complicated illness / injury requiring diagnostic workup.  Patient is afebrile, no tachycardia.  Patient did have some tenderness to palpation on exam and did appear a bit distended.  Low suspicion for bowel obstruction given patient has not had any vomiting and does have palpable stool on DRE.  Will obtain abdominal x-ray to rule out obstructive bowel gas pattern, if normal will plan to do  an enema.   Clinical Course as of 09/25/24 1504  Fri Sep 25, 2024  1449 DG Abdomen 1 View Nonobstructive bowel gas pattern but does have moderate stool burden.  Will proceed with soapsuds enema. [LD]  1502 Care of the patient will be passed to the oncoming provider who will make the final disposition.  [LD]    Clinical Course User Index [LD] Cleaster Tinnie LABOR, PA-C     FINAL CLINICAL IMPRESSION(S) / ED DIAGNOSES   Final diagnoses:  None     Rx / DC Orders   ED Discharge Orders     None        Note:  This document was prepared using Dragon voice recognition software and may include unintentional dictation errors.   Cleaster Tinnie LABOR, PA-C 09/25/24 1504    Claudene Rover, MD 09/25/24 1517

## 2024-09-25 NOTE — ED Provider Notes (Signed)
-----------------------------------------   3:29 PM on 09/25/2024 -----------------------------------------  Blood pressure 134/74, pulse 70, temperature 98.4 F (36.9 C), resp. rate 18, SpO2 100%.  Assuming care from Tinnie Hudson, PA-C.  In short, [name] [name] is a 68 y.o. female with a chief complaint of Constipation .  Refer to the original H&P for additional details.  The current plan of care is to await patient to pass bowel movement and discharge home.  ----------------------------------------- 3:33 PM on 09/25/2024 -----------------------------------------  Patient was able to pass 2 Bowel movement.  She is ready for discharge home.  She is stable.  Advised follow-up with primary care.    Clinical Course as of 09/25/24 1529  Fri Sep 25, 2024  1449 DG Abdomen 1 View Nonobstructive bowel gas pattern but does have moderate stool burden.  Will proceed with soapsuds enema. [LD]  1502 Care of the patient will be passed to the oncoming provider who will make the final disposition.  [LD]    Clinical Course User Index [LD] Hudson Tinnie DELENA DEVONNA Margrette, Fredis Malkiewicz A, PA-C 09/25/24 1534    Claudene Rover, MD 09/28/24 [phone]

## 2024-09-25 NOTE — ED Notes (Signed)
 Will do soap suds enema when DG of abd results.

## 2024-09-25 NOTE — Discharge Instructions (Signed)
 Your evaluated in the ED for constipation.  Your abdominal x-ray showed moderate stool burden.  You were given a enema in the ED and successfully passed bowel.  Continue MiraLAX at home.  Consider a high fiber diet and follow-up with your primary care provider.

## 2024-09-25 NOTE — ED Triage Notes (Signed)
 Pt comes with c/o constipation since the 15th. Pt states she had a recent surgery and has been constipated since. Pt has not had BM prior to the surgery but wasn't constipated. Pt states intense pain.  Pt has tried otc meds with no relief.

## 2024-09-25 NOTE — ED Notes (Addendum)
 Pt was able to pass stool successfully x2. Pt expresses much relief.

## 2024-09-26 NOTE — Progress Notes (Signed)
 TRANSITIONAL CARE TELEPHONE ENCOUNTER NOTE   [name] [name] is a 68 y.o. female who was contacted after a hospitalization at [facility] on 09-24-24 for Paroxysmal atrial fibrillation   The following were reviewed with the patient today:   Note: Only barriers and interventions are noted below. If no flowsheet data is found, then no issues were identified or the patient was not able to be reached.    Patient Knowledge and Self Care      09/26/2024    2:27 PM  Patient Knowledge and Self-Care  Did the patient/caregiver understand the primary reason for hospitalization? Yes  Is the patient or the patient's caregiver able to describe what is necessary for self care at this time? Yes  Are there any additional educational needs identified today? No     Discharge Follow-Up Appointment      09/26/2024    2:27 PM  Appointments  Does the patient have an appropriate follow-up visit scheduled within 14 days of discharge? No  Additional Comments pt will follow up with pcp  Are there any barriers to the patient keeping their appointment? No  Has the patient been hospitalized more than once in the past 30 days? No    Financial Resource Strain: Low Risk  (09/26/2024)   Overall Financial Resource Strain (CARDIA)   . Difficulty of Paying Living Expenses: Not very hard    Transportation Needs: No Transportation Needs (09/26/2024)   PRAPARE - Transportation   . Lack of Transportation (Medical): No   . Lack of Transportation (Non-Medical): No    Food Insecurity: No Food Insecurity (09/26/2024)   Hunger Vital Sign   . Worried About Programme researcher, broadcasting/film/video in the Last Year: Never true   . Ran Out of Food in the Last Year: Never true      Medication Review      09/26/2024    2:27 PM  Medication Review  Were any new medicines prescribed at discharge? No      New or Worsening Health Issues      09/26/2024    2:27 PM  New or Worsening Health Issues  Is the patient  experiencing any new or worsening symptoms after discharge? Yes*  Additional Comments Pt states that she was constipated and had to go to ED on 10/17  Symptoms noted following discharge: Bowel issues      Home Health       09/26/2024    2:27 PM  Home Health  Has your home care agency contacted you? Not Applicable    Is patient eligible for TCM coding?:     09/26/2024    2:27 PM  [facility] Coding Eligibility  Did you communicate with the patient and/or caregiver within 2 business days of discharge OR Did you document two or more separate communication attempts within 2 business days of discharge? Yes  Is the patient eligible for TCM billing? Yes - (patient is eligible for TCM coding)    The importance of keeping the appointment was emphasized to her.  Future Appointments     Date/Time Provider Department Center Visit Type   09/30/2024 9:30 AM (Arrive by 9:00 AM) [facility] [facility] HOOKUP - MCT/NO CHARGE   09/30/2024 9:45 AM (Arrive by 9:15 AM) [facility] [facility] INTERPRETATION   11/11/2024 11:00 AM Callwood, Cara Endow, MD [facility] C FOLLOW UP   12/23/2024 9:40  AM (Arrive by 9:10 AM) Wendell Boone Skates, MD [facility] [place] [address] [facility] CARD RETURN   02/24/2025 9:00 AM Theotis Lavelle BRAVO, MD [facility] C RETURN VISIT       RONIQEIYA LUTHER    *Some images could not be shown.

## 2024-09-28 ENCOUNTER — Ambulatory Visit (INDEPENDENT_AMBULATORY_CARE_PROVIDER_SITE_OTHER): Admitting: Family Medicine

## 2024-09-28 ENCOUNTER — Encounter: Payer: Self-pay | Admitting: Family Medicine

## 2024-09-28 VITALS — BP 118/70 | HR 64 | Temp 98.2°F | Ht 60.0 in | Wt 166.2 lb

## 2024-09-28 DIAGNOSIS — Z23 Encounter for immunization: Secondary | ICD-10-CM | POA: Diagnosis not present

## 2024-09-28 DIAGNOSIS — N1831 Chronic kidney disease, stage 3a: Secondary | ICD-10-CM

## 2024-09-28 DIAGNOSIS — I480 Paroxysmal atrial fibrillation: Secondary | ICD-10-CM

## 2024-09-28 DIAGNOSIS — K5900 Constipation, unspecified: Secondary | ICD-10-CM

## 2024-09-28 DIAGNOSIS — S50819A Abrasion of unspecified forearm, initial encounter: Secondary | ICD-10-CM

## 2024-09-28 DIAGNOSIS — Z95818 Presence of other cardiac implants and grafts: Secondary | ICD-10-CM

## 2024-09-28 NOTE — Progress Notes (Signed)
 Subjective:    Patient ID: [name] [name], female    DOB: 1956/03/11, 68 y.o.   MRN: [id number]  HPI  Wt Readings from Last 3 Encounters:  09/28/24 166 lb 4 oz (75.4 kg)  09/07/24 169 lb (76.7 kg)  09/03/24 168 lb 4 oz (76.3 kg)   32.47 kg/m  Vitals:   09/28/24 1201  BP: 118/70  Pulse: 64  Temp: 98.2 F (36.8 C)  SpO2: 100%     Pt presents for follow up of  Arm injury -scratched by dog Watchman procedure site in groin area  ER visit    Dog scratch (her dog-jumped up on her)  Right forearm Pretty bad  Some skin tear  Used soap/water/ alcohol  Not painful     Tdap 08/2017     ER visit 10/17 for constipation / no bm in 4 d  Lower abd and rectal pain  Had tried miralax, senekot and MOM  Per pt-often gets constipated after anesthesia   Administered soap suds enema with success   Had a little diarrhea and gas  Now she is still having some stool when she urinates   No fever  No abdominal pain  No n/v    DG Abdomen 1 View Result Date: 09/25/2024 CLINICAL DATA:  Worsening constipation and abdominal pain. EXAM: ABDOMEN - 1 VIEW COMPARISON:  No comparison studies available. FINDINGS: Supine view the abdomen shows diffuse gas-filled loops of small bowel in a nonobstructive pattern. Colon shows no gaseous dilatation. There is some small volume stool in the right colon with transverse colon largely free of stool. Moderate stool volume noted distal descending colon and rectum. Surgical clips in the right upper quadrant suggest prior cholecystectomy. Degenerative changes noted in the spine. IMPRESSION: Nonobstructive bowel gas pattern. Moderate stool volume in the distal descending colon and rectum. Electronically Signed   By: Camellia Candle M.D.   On: 09/25/2024 14:21   DG Chest 2 View Result Date: 09/03/2024 EXAM: 2 VIEW(S) XRAY OF THE CHEST 09/03/2024 10:39:50 AM COMPARISON: None available. CLINICAL HISTORY: dry cough, some occasional night sweats without fever.  FINDINGS: LUNGS AND PLEURA: No focal pulmonary opacity. No pulmonary edema. No pleural effusion. No pneumothorax. HEART AND MEDIASTINUM: No acute abnormality of the cardiac and mediastinal silhouettes. BONES AND SOFT TISSUES: Multilevel degenerative changes of thoracolumbar spine. Right upper quadrant surgical clips noted. IMPRESSION: 1. No acute cardiopulmonary process. 2. Incidental right upper quadrant surgical clips. 3. Multilevel degenerative changes of the thoracolumbar spine are noted without acute complication. Electronically signed by: Waddell Calk MD 09/03/2024 11:42 AM EDT RP Workstation: [id number]     Watchman device was implanted 09/23/24 for a fib with left atrial appendage dysfunction  It went well   Groin area is a little swollen and a little firm    Lab Results  Component Value Date   WBC 7.3 09/03/2024   HGB 13.3 09/03/2024   HCT 40.6 09/03/2024   MCV 92.0 09/03/2024   PLT 293.0 09/03/2024   Lab Results  Component Value Date   NA 140 05/25/2024   K 4.4 05/25/2024   CO2 28 05/25/2024   GLUCOSE 97 05/25/2024   BUN 18 05/25/2024   CREATININE 1.18 (H) 05/25/2024   CALCIUM 9.6 05/25/2024   GFR 54.46 (L) 10/29/2023   EGFR 51 (L) 05/25/2024   GFRNONAA 46 (L) 01/31/2024    Patient Active Problem List   Diagnosis Date Noted   Abrasion, forearm w/o infection 09/28/2024   Presence of Watchman left atrial  appendage closure device 09/28/2024   Constipation 09/28/2024   Purpura senilis 09/03/2024   Knee pain, right 06/01/2024   Rosacea 04/02/2024   Pulmonary hypertension, unspecified (HCC) 12/31/2023   Edema of lower extremity 12/31/2023   Chronic kidney disease, stage 3a (HCC) 12/31/2023   NSTEMI (non-ST elevated myocardial infarction) (HCC) 10/22/2023   S/P ablation of atrial fibrillation 09/30/2023   Chronic anticoagulation 09/30/2023   Pain of left hip 09/11/2023   History of anal fissures 09/09/2023   Osteopenia 07/30/2023   Headache 07/05/2023   Paroxysmal  atrial fibrillation (HCC) 06/27/2023   Bradycardia 05/30/2023   Melanoma in situ (HCC) 12/25/2022   ASCUS of cervix with negative high risk HPV 06/25/2022   Osteoarthritis of knee 05/25/2022   Hemorrhoid 04/25/2022   Estrogen deficiency 01/02/2022   Bipolar disorder, in partial remission, most recent episode mixed (HCC) 02/25/2020   Dyspnea 01/19/2020   Prediabetes 03/03/2018   Atrophic vaginitis 07/08/2017   Cervical stenosis (uterine cervix) 01/03/2017   Dry cough 04/18/2016   Hearing loss 10/25/2015   Routine general medical examination at a health care facility 10/17/2015   Facial rash 02/22/2015   Colon cancer screening 10/16/2013   Enthesopathy of ankle and tarsus 12/01/2009   Enthesopathy of ankle and tarsus 12/01/2009   Asymptomatic postmenopausal status 09/30/2009   Arthropathy of pelvic region and thigh 03/10/2009   DEGENERATIVE DISC DISEASE, LUMBAR SPINE 03/10/2009   DDD (degenerative disc disease), lumbosacral 03/10/2009   ONYCHOMYCOSIS 09/17/2008   Hypothyroidism 08/05/2007   Class 2 severe obesity due to excess calories with serious comorbidity and body mass index (BMI) of 35.0 to 35.9 in adult 08/05/2007   Bipolar disorder (HCC) 08/05/2007   Allergic rhinitis 08/05/2007   Irritable bowel syndrome 08/05/2007   Esophageal reflux 08/05/2007   HYPERCHOLESTEROLEMIA, PURE 07/07/2007   Past Medical History:  Diagnosis Date   Actinic keratosis 03/29/2021   R central upper lip   Allergic rhinitis    Allergy    Anemia 10/29/2023   Anxiety    Arthritis    Atypical mole 12/26/2021   spinal upper back, exc 01/15/22   Basal cell carcinoma 10/16/2010   R lower pretibia    Bipolar 1 disorder (HCC)    Chronic kidney disease    Chronic kidney disease    Depression    Dyspnea    Fissure, anal    GERD (gastroesophageal reflux disease)    History of basal cell carcinoma excision    ON FACE   History of squamous cell carcinoma excision    ON RIGHT LEG   Hyperlipidemia     Hypothyroidism    IBS (irritable bowel syndrome)    Liver cyst    Malignant melanoma in situ (HCC) 11/20/2022   Right labia minora. Excision 12/25/2022 at anterior vulva, left of clitoral hood, margins involved. Surgery scheduled for 02/12/2023. Recurrence 12/23/2023, excised 02/11/2024, margins clear.   Mallet finger of left hand    small finger   NSTEMI (non-ST elevated myocardial infarction) (HCC) 10/22/2023   Pneumonia    Pre-diabetes    Past Surgical History:  Procedure Laterality Date   ABLATION     CARDIAC CATHETERIZATION  04/10/1999   per pt normal   CARDIOVASCULAR STRESS TEST  02-23-2011  dr [facility])   normal nuclear study/  no ischemia/  ef 64%   CARDIOVERSION N/A 01/10/2024   Procedure: CARDIOVERSION;  Surgeon: Hilarie Rocher, MD;  Location: [facility];  Service: Cardiovascular;  Laterality: N/A;   CATARACT EXTRACTION W/  INTRAOCULAR LENS  IMPLANT, BILATERAL  12/10/2001   Bil   CESAREAN SECTION  12/10/1982   CLOSED REDUCTION FINGER WITH PERCUTANEOUS PINNING Left 04/22/2014   Procedure: LEFT SMALL FINGER CLOSED REDUCTION PINNING;  Surgeon: Prentice LELON Pagan, MD;  Location: [facility] [place];  Service: Orthopedics;  Laterality: Left;   COLONOSCOPY  11/09/2002   DILATION AND CURETTAGE OF UTERUS  1977   LAPAROSCOPIC CHOLECYSTECTOMY  03/18/2000   RECONSTRUCTION OF EYELID Bilateral 08/2022   RIGHT/LEFT HEART CATH AND CORONARY ANGIOGRAPHY Bilateral 11/25/2023   Procedure: RIGHT/LEFT HEART CATH AND CORONARY ANGIOGRAPHY;  Surgeon: Florencio Cara BIRCH, MD;  Location: [facility];  Service: Cardiovascular;  Laterality: Bilateral;   TEE WITHOUT CARDIOVERSION N/A 01/10/2024   Procedure: TRANSESOPHAGEAL ECHOCARDIOGRAM (TEE);  Surgeon: Hilarie Rocher, MD;  Location: [facility];  Service: Cardiovascular;  Laterality: N/A;   TRANSTHORACIC ECHOCARDIOGRAM  02/23/2011   normal lvf/  ef 58%/  mild rve/   mild lae/  mild  mr  & tr/  mild pulmonary htn   VULVECTOMY  N/A 12/25/2022   Procedure: PARTIAL  VULVECTOMY;  Surgeon: Eldonna Mays, MD;  Location: [facility] [place];  Service: Gynecology;  Laterality: N/A;   VULVECTOMY N/A 02/12/2023   Procedure: WIDE LOCAL EXCISION OF VULVA AND REMOVAL OF CLITORIS;  Surgeon: Eldonna Mays, MD;  Location: [facility] [place];  Service: Gynecology;  Laterality: N/A;   VULVECTOMY N/A 02/11/2024   Procedure: WIDE EXCISION VULVECTOMY;  Surgeon: Eldonna Mays, MD;  Location: [facility];  Service: Gynecology;  Laterality: N/A;   WISDOM TOOTH EXTRACTION     Social History   Tobacco Use   Smoking status: Never   Smokeless tobacco: Never  Vaping Use   Vaping status: Never Used  Substance Use Topics   Alcohol use: Not Currently    Comment: Only on cruises or rarely   Drug use: No   Family History  Problem Relation Age of Onset   Stroke Mother    Heart disease Mother    Hypertension Father    Diabetes Father    Hyperlipidemia Father    Breast cancer Sister    Cancer Sister    Breast cancer Sister    Diabetes Brother    Alcohol abuse Sister    Arthritis Sister    Mental illness Sister    Colon cancer Neg Hx    Ovarian cancer Neg Hx    Endometrial cancer Neg Hx    Pancreatic cancer Neg Hx    Prostate cancer Neg Hx    Allergies  Allergen Reactions   Ingrezza [Valbenazine Tosylate] Other (See Comments)    Night sweats, worsened tardive dyskinesia, nightmares   Lidocaine  Swelling    ALL CAINE DRUGS EXCEPT: marcaine  & sensercaine    Xylocaine  [Lidocaine  Hcl] Swelling    ALLERGIC TO ALL CAINES EXCEPT SENSORCAINE  , AND MARCAINE    Albuterol Other (See Comments)    Leg swelling   Amantadine     Muscle pain and tremors    Austedo Xr [Deutetrabenazine]     Caused Muscle aches and tremors   Codeine Nausea And Vomiting    Vomits blood   Lithium Swelling   Singulair [Montelukast] Swelling    Leg swelling   Tegretol [Carbamazepine] Other (See Comments)    skin crawls   Current  Outpatient Medications on File Prior to Visit  Medication Sig Dispense Refill   acetaminophen  (TYLENOL ) 650 MG CR tablet Take 1,300 mg by mouth every 8 (eight) hours as needed for pain.  apixaban (ELIQUIS) 5 MG TABS tablet Take 5 mg by mouth 2 (two) times daily.     ARIPiprazole  (ABILIFY ) 10 MG tablet Take 10 mg by mouth at bedtime.     Biotin 5000 MCG CAPS Take 5,000 mcg by mouth daily.     buPROPion  (WELLBUTRIN  XL) 300 MG 24 hr tablet Take 300 mg by mouth daily.     cetirizine (ZYRTEC ALLERGY) 10 MG tablet Take 10 mg by mouth daily.     cholecalciferol (VITAMIN D3) 25 MCG (1000 UNIT) tablet Take 1,000 Units by mouth daily.     clonazePAM  (KLONOPIN ) 1 MG tablet Take 1 mg by mouth at bedtime.     Coenzyme Q10 (COQ10) 100 MG CAPS Take 100 mg by mouth at bedtime.     famotidine  (PEPCID ) 20 MG tablet TAKE 1 TABLET TWICE DAILY 180 tablet 1   fluticasone  (FLONASE ) 50 MCG/ACT nasal spray Place 2 sprays into both nostrils daily. 48 g 3   JARDIANCE 10 MG TABS tablet Take 10 mg by mouth daily.     lamoTRIgine  (LAMICTAL ) 200 MG tablet Take 200 mg by mouth at bedtime.     levothyroxine  (SYNTHROID ) 50 MCG tablet TAKE 1 TABLET EVERY DAY BEFORE BREAKFAST 90 tablet 2   lisinopril  (ZESTRIL ) 2.5 MG tablet Take 1 tablet (2.5 mg total) by mouth daily. 30 tablet 0   lovastatin  (MEVACOR ) 40 MG tablet TAKE 1 TABLET EVERY DAY 90 tablet 2   nystatin  powder Apply 1 Application topically 3 (three) times daily as needed.     ramelteon (ROZEREM) 8 MG tablet Take 8 mg by mouth at bedtime.     torsemide (DEMADEX) 20 MG tablet Take 20 mg by mouth daily.     traZODone (DESYREL) 50 MG tablet Take 50-150 mg by mouth at bedtime as needed. (Patient not taking: Reported on 09/28/2024)     zolpidem  (AMBIEN ) 10 MG tablet Take 5 mg by mouth at bedtime.     No current facility-administered medications on file prior to visit.    Review of Systems  Constitutional:  Negative for activity change, appetite change, fatigue, fever  and unexpected weight change.  HENT:  Negative for congestion, ear pain, rhinorrhea, sinus pressure and sore throat.   Eyes:  Negative for pain, redness and visual disturbance.  Respiratory:  Negative for cough, shortness of breath and wheezing.   Cardiovascular:  Negative for chest pain and palpitations.  Gastrointestinal:  Positive for constipation and diarrhea. Negative for abdominal pain and blood in stool.  Endocrine: Negative for polydipsia and polyuria.  Genitourinary:  Negative for dysuria, frequency and urgency.  Musculoskeletal:  Negative for arthralgias, back pain and myalgias.  Skin:  Positive for wound. Negative for pallor and rash.  Allergic/Immunologic: Negative for environmental allergies.  Neurological:  Negative for dizziness, syncope and headaches.  Hematological:  Negative for adenopathy. Does not bruise/bleed easily.  Psychiatric/Behavioral:  Negative for decreased concentration and dysphoric mood. The patient is not nervous/anxious.        Objective:   Physical Exam Constitutional:      General: She is not in acute distress.    Appearance: Normal appearance. She is well-developed. She is obese. She is not ill-appearing or diaphoretic.  HENT:     Head: Normocephalic and atraumatic.  Eyes:     Conjunctiva/sclera: Conjunctivae normal.     Pupils: Pupils are equal, round, and reactive to light.  Neck:     Thyroid : No thyromegaly.     Vascular: No carotid bruit or JVD.  Cardiovascular:     Rate and Rhythm: Normal rate. Rhythm irregular.     Heart sounds: Normal heart sounds.     No gallop.  Pulmonary:     Effort: Pulmonary effort is normal. No respiratory distress.     Breath sounds: Normal breath sounds. No stridor. No wheezing, rhonchi or rales.  Abdominal:     General: There is no distension or abdominal bruit.     Palpations: Abdomen is soft. There is no mass.     Tenderness: There is no abdominal tenderness. There is no guarding or rebound.     Hernia:  No hernia is present.  Musculoskeletal:     Cervical back: Normal range of motion and neck supple.     Right lower leg: No edema.     Left lower leg: No edema.  Lymphadenopathy:     Cervical: No cervical adenopathy.  Skin:    General: Skin is warm and dry.     Coloration: Skin is not pale.     Findings: Bruising present. No rash.     Comments: Small hematoma at cath site in right groin with old ecchmosis Non tender No drainage   Right forearm Multiple scratch marks/abrasions with scabbing  Surrounded by linear ecchymosis  No drainage No tenderness/swelling or erythema   Neurological:     Mental Status: She is alert.     Coordination: Coordination normal.     Deep Tendon Reflexes: Reflexes are normal and symmetric. Reflexes normal.  Psychiatric:        Mood and Affect: Mood normal.           Assessment & Plan:   Problem List Items Addressed This Visit       Cardiovascular and Mediastinum   Paroxysmal atrial fibrillation (HCC)   Recent implantation of watchman device  Doing well  Hopes to stop anticoagulation in 3 mo Cardiology note reviewed         Musculoskeletal and Integument   Abrasion, forearm w/o infection - Primary   From her dog / scratches from jumping on her  No signs of infection / few abrasions and ecchymosis   Td update today  Instructed to keep clean /soap and water Cover loosely when needed to protect from dirt (coban/non stick pad)  Update if no further  in a week or if worsening   Call back and Er precautions noted in detail today        Relevant Orders   Td : Tetanus/diphtheria >7yo Preservative  free (Completed)     Genitourinary   Chronic kidney disease, stage 3a (HCC)   GFR 51 Stable  Great water intake On jardiance 10         Other   Presence of Watchman left atrial appendage closure device   Doing well with this  Small hematoma at cath site in right groin Instructed pt to call cardiology if this does not continue to  improve      Constipation   Bad bout of constipation following her Watchman surgery /anesthesia  Pt notes she is prone to post operative constipation   Seen in ER / resolution with soap suds enema Still some loose stool now  Reviewed hospital records, lab results and studies in detail    Discussed need for fluids and high fiber diet  Prunes daily  Miralax daily to prevent recurrence  Normal exam today  Call back and Er precautions noted in detail today

## 2024-09-28 NOTE — Assessment & Plan Note (Addendum)
 Doing well with this  Small hematoma at cath site in right groin Instructed pt to call cardiology if this does not continue to improve

## 2024-09-28 NOTE — Assessment & Plan Note (Signed)
 From her dog / scratches from jumping on her  No signs of infection / few abrasions and ecchymosis   Td update today  Instructed to keep clean /soap and water Cover loosely when needed to protect from dirt (coban/non stick pad)  Update if no further  in a week or if worsening   Call back and Er precautions noted in detail today

## 2024-09-28 NOTE — Assessment & Plan Note (Signed)
 Recent implantation of watchman device  Doing well  Hopes to stop anticoagulation in 3 mo Cardiology note reviewed

## 2024-09-28 NOTE — Assessment & Plan Note (Signed)
 GFR 51 Stable  Great water intake On jardiance 10

## 2024-09-28 NOTE — Patient Instructions (Signed)
 Keep the arm very clean with soap and water  Antibiotic ointment is ok  Cover it lightly (non stick pad and coban wrap) if you want to protect from dirt as needed   Td  shot today    Watch the groin site for redness/swelling/pain or oozing  Let you cardiologist/ know if any problems   For constipation use miralax as need needed and prunes  Keep up your fluids

## 2024-09-28 NOTE — Assessment & Plan Note (Signed)
 Bad bout of constipation following her Watchman surgery /anesthesia  Pt notes she is prone to post operative constipation   Seen in ER / resolution with soap suds enema Still some loose stool now  Reviewed hospital records, lab results and studies in detail    Discussed need for fluids and high fiber diet  Prunes daily  Miralax daily to prevent recurrence  Normal exam today  Call back and Er precautions noted in detail today

## 2024-09-29 ENCOUNTER — Telehealth: Payer: Self-pay

## 2024-09-29 NOTE — Transitions of Care (Post Inpatient/ED Visit) (Signed)
   09/29/2024  Name: [name] [name] MRN: [id number] DOB: 1956/04/27  Today's TOC FU Call Status: Today's TOC FU Call Status:: Unsuccessful Call (1st Attempt) Unsuccessful Call (1st Attempt) Date: 09/29/24  Attempted to reach the patient regarding the most recent Inpatient/ED visit.  Noted on TOC workbook patient was in the ED on 09/25/2024 for constipation, so this was the first call to the patient. Left a VM requesting a call back.   Follow Up Plan: Additional outreach attempts will be made to reach the patient to complete the Transitions of Care (Post Inpatient/ED visit) call.   Alan Ee, RN, BSN, CEN [employer]- Transition of Care Team.  [facility] [phone]

## 2024-10-01 ENCOUNTER — Telehealth: Payer: Self-pay

## 2024-10-01 NOTE — Patient Instructions (Signed)
 Visit Information  Thank you for taking time to visit with me today. Please don't hesitate to contact me if I can be of assistance to you.  Patient instructions: May shower, but no soaking tub baths  If you smoke (or have smoked within the last year), we strongly recommend that you do not smoke.  Notify cardiology provider of chest pain   Notify provider of swelling in arms, legs, or stomach   Notify Provider of bleeding or expanding swelling at groin catheter access site   Notify provider temperature greater than 101.0 F (38.3 C) degrees   Notify provider of dizziness or passing out   Notify provider of weight gain greater than 2 lbs in 1 day or 5 lbs in 1 week   Notify provider of difficulty breathing or shortness of breath   Report questions or concerns to the [facility] at [phone]   Notify primary care physician of other symptoms   For a life-threatening emergency, call 911   Follow-up with Primary Care Provider   Follow-up with Cardiology    Patient verbalizes understanding of instructions and care plan provided today and agrees to view in MyChart. Active MyChart status and patient understanding of how to access instructions and care plan via MyChart confirmed with patient.     The patient has been provided with contact information for the care management team and has been advised to call with any health related questions or concerns.   Please call the care guide team at [phone] if you need to cancel or reschedule your appointment.   Please call the Suicide and Crisis Lifeline: 988 call the [place]  National Suicide Prevention Lifeline: 463-853-9752 or TTY: 510-718-5161 TTY 720 174 4005) to talk to a trained counselor call 1-800-273-TALK (toll free, 24 hour hotline) if you are experiencing a Mental Health or Behavioral Health Crisis or need someone to talk to.  Arvin Seip RN, BSN, CCM [employer], Population Health Case  Manager Phone: [phone]

## 2024-10-01 NOTE — Transitions of Care (Post Inpatient/ED Visit) (Signed)
 10/01/2024  Name: [name] [name] MRN: [id number] DOB: 05-Jul-1956  Today's TOC FU Call Status: Today's TOC FU Call Status:: Successful TOC FU Call Completed TOC FU Call Complete Date: 10/01/24 Patient's Name and Date of Birth confirmed.  Transition Care Management Follow-up Telephone Call Date of Discharge: 09/24/24 Discharge Facility: Other [facility]) Name of Other (Non[facility]) Discharge Facility: [facility] Type of Discharge: Inpatient Admission Primary Inpatient Discharge Diagnosis:: watchman procedure How have you been since you were released from the hospital?: Better Any questions or concerns?: No  Items Reviewed: Did you receive and understand the discharge instructions provided?: Yes Medications obtained,verified, and reconciled?: Yes (Medications Reviewed) Any new allergies since your discharge?: No Dietary orders reviewed?: Yes Type of Diet Ordered:: Low salt heart healthy Do you have support at home?: Yes People in Home [RPT]: spouse Name of Support/Comfort Primary Source: Donald Labree  Medications Reviewed Today: Medications Reviewed Today     Reviewed by Diron Haddon E, RN (Registered Nurse) on 10/01/24 at 1248  Med List Status: <None>   Medication Order Taking? Sig Documenting Provider Last Dose Status Informant  acetaminophen  (TYLENOL ) 650 MG CR tablet [id number] Yes Take 1,300 mg by mouth every 8 (eight) hours as needed for pain. [provider]  Active Self  apixaban (ELIQUIS) 5 MG TABS tablet [id number] Yes Take 5 mg by mouth 2 (two) times daily. [provider]  Active Self  ARIPiprazole  (ABILIFY ) 10 MG tablet [id number] Yes Take 10 mg by mouth at bedtime. [provider]  Active Self  Biotin 5000 MCG CAPS [id number] Yes Take 5,000 mcg by mouth daily. [provider]  Active Self  buPROPion  (WELLBUTRIN  XL) 300 MG 24 hr tablet [id number] Yes Take 300 mg by mouth daily. [provider]  Active  Self  cetirizine (ZYRTEC ALLERGY) 10 MG tablet [id number] Yes Take 10 mg by mouth daily. [provider]  Active Self  cholecalciferol (VITAMIN D3) 25 MCG (1000 UNIT) tablet [id number] Yes Take 1,000 Units by mouth daily. [provider]  Active Self  clonazePAM  (KLONOPIN ) 1 MG tablet [id number] Yes Take 1 mg by mouth at bedtime. [provider]  Active Self           Med Note (SWAZILAND, KIMBERLY B   Thu Feb 28, 2023  3:33 PM)    Coenzyme Q10 (COQ10) 100 MG CAPS [id number] Yes Take 100 mg by mouth at bedtime. [provider]  Active Self  famotidine  (PEPCID ) 20 MG tablet [id number] Yes TAKE 1 TABLET TWICE DAILY Tower, Laine LABOR, MD  Active   fluticasone  (FLONASE ) 50 MCG/ACT nasal spray [id number] Yes Place 2 sprays into both nostrils daily. Tower, Laine LABOR, MD  Active   JARDIANCE 10 MG TABS tablet [id number] Yes Take 10 mg by mouth daily. [provider]  Active   lamoTRIgine  (LAMICTAL ) 200 MG tablet [id number] Yes Take 200 mg by mouth at bedtime. [provider]  Active Self  levothyroxine  (SYNTHROID ) 50 MCG tablet [id number] Yes TAKE 1 TABLET EVERY DAY BEFORE BREAKFAST Tower, Laine LABOR, MD  Active   lisinopril  (ZESTRIL ) 2.5 MG tablet [id number] Yes Take 1 tablet (2.5 mg total) by mouth daily. Alexander, Natalie, DO  Active Self  lovastatin  (MEVACOR ) 40 MG tablet [id number] Yes TAKE 1 TABLET EVERY DAY Tower, Laine LABOR, MD  Active   nystatin  powder [id number] Yes Apply 1 Application topically 3 (three) times daily as needed. [provider]  Active   ramelteon (ROZEREM) 8 MG tablet [id number] Yes Take 8  mg by mouth at bedtime. [provider]  Active   torsemide (DEMADEX) 20 MG tablet [id number]  Take 20 mg by mouth daily. [provider]  Active Self  traZODone (DESYREL) 50 MG tablet [id number]  Take 50-150 mg by mouth at bedtime as needed.  Patient not taking: Reported on 10/01/2024   [provider]  Active   zolpidem  (AMBIEN )  10 MG tablet [id number]  Take 5 mg by mouth at bedtime.  Patient not taking: Reported on 10/01/2024   [provider]  Active Self           Med Note MAYER ARLYSS GORMAN Pablo Mar 20, 2021 11:57 AM)              Home Care and Equipment/Supplies: Were Home Health Services Ordered?: No Any new equipment or medical supplies ordered?: No  Functional Questionnaire: Do you need assistance with bathing/showering or dressing?: No Do you need assistance with meal preparation?: No Do you need assistance with eating?: No Do you have difficulty maintaining continence: No Do you need assistance with getting out of bed/getting out of a chair/moving?: No Do you have difficulty managing or taking your medications?: No  Follow up appointments reviewed: PCP Follow-up appointment confirmed?: Yes Date of PCP follow-up appointment?: 09/28/24 Follow-up Provider: Dr. Laine [facility] Follow-up appointment confirmed?: Yes Date of Specialist follow-up appointment?:  (patient states next cardiology appoinment 12/2023.) Follow-Up Specialty Provider:: Dr. Franky Ned Do you need transportation to your follow-up appointment?: No Do you understand care options if your condition(s) worsen?: Yes-patient verbalized understanding  SDOH Interventions Today    Flowsheet Row Most Recent Value  SDOH Interventions   Food Insecurity Interventions Intervention Not Indicated  Housing Interventions Intervention Not Indicated  Transportation Interventions Intervention Not Indicated  Utilities Interventions Intervention Not Indicated   Discussed and offered 30 day TOC program.  Patient  declined.  The patient has been provided with contact information for the care management team and has been advised to call with any health -related questions or concerns.  The patient verbalized understanding with current plan of care.  The patient is directed to their insurance card regarding availability of  benefits coverage.    Arvin Seip RN, BSN, CCM [employer], Population Health Case Manager Phone: [phone]

## 2024-10-02 ENCOUNTER — Other Ambulatory Visit: Payer: Self-pay | Admitting: Family Medicine

## 2024-10-05 ENCOUNTER — Other Ambulatory Visit: Payer: Self-pay | Admitting: Family Medicine

## 2024-10-05 DIAGNOSIS — Z1231 Encounter for screening mammogram for malignant neoplasm of breast: Secondary | ICD-10-CM

## 2024-10-06 ENCOUNTER — Ambulatory Visit: Admitting: Dermatology

## 2024-10-06 DIAGNOSIS — L814 Other melanin hyperpigmentation: Secondary | ICD-10-CM

## 2024-10-06 DIAGNOSIS — L821 Other seborrheic keratosis: Secondary | ICD-10-CM

## 2024-10-06 DIAGNOSIS — Z86006 Personal history of melanoma in-situ: Secondary | ICD-10-CM

## 2024-10-06 DIAGNOSIS — Q828 Other specified congenital malformations of skin: Secondary | ICD-10-CM

## 2024-10-06 DIAGNOSIS — L719 Rosacea, unspecified: Secondary | ICD-10-CM | POA: Diagnosis not present

## 2024-10-06 DIAGNOSIS — L578 Other skin changes due to chronic exposure to nonionizing radiation: Secondary | ICD-10-CM

## 2024-10-06 DIAGNOSIS — Z1283 Encounter for screening for malignant neoplasm of skin: Secondary | ICD-10-CM

## 2024-10-06 DIAGNOSIS — D2272 Melanocytic nevi of left lower limb, including hip: Secondary | ICD-10-CM

## 2024-10-06 DIAGNOSIS — C44311 Basal cell carcinoma of skin of nose: Secondary | ICD-10-CM

## 2024-10-06 DIAGNOSIS — D229 Melanocytic nevi, unspecified: Secondary | ICD-10-CM

## 2024-10-06 DIAGNOSIS — D1801 Hemangioma of skin and subcutaneous tissue: Secondary | ICD-10-CM

## 2024-10-06 DIAGNOSIS — W908XXA Exposure to other nonionizing radiation, initial encounter: Secondary | ICD-10-CM | POA: Diagnosis not present

## 2024-10-06 DIAGNOSIS — D485 Neoplasm of uncertain behavior of skin: Secondary | ICD-10-CM

## 2024-10-06 DIAGNOSIS — Z86018 Personal history of other benign neoplasm: Secondary | ICD-10-CM

## 2024-10-06 DIAGNOSIS — Z85828 Personal history of other malignant neoplasm of skin: Secondary | ICD-10-CM

## 2024-10-06 NOTE — Patient Instructions (Addendum)

## 2024-10-06 NOTE — Progress Notes (Signed)
 Follow-Up Visit   Subjective  [name] [name] is a 68 y.o. female who presents for the following: Skin Cancer Screening and Full Body Skin Exam  The patient presents for Total-Body Skin Exam (TBSE) for skin cancer screening and mole check. The patient has spots, moles and lesions to be evaluated, some may be new or changing. Recheck spot on nose and right anterior ankle, more pink per patient, no bleeding or scabbing.  Hx of MIS, SCC, BCC, DN.   The following portions of the chart were reviewed this encounter and updated as appropriate: medications, allergies, medical history  Review of Systems:  No other skin or systemic complaints except as noted in HPI or Assessment and Plan.  Objective  Well appearing patient in no apparent distress; mood and affect are within normal limits.  A full examination was performed including scalp, head, eyes, ears, nose, lips, neck, chest, axillae, abdomen, back, buttocks, bilateral upper extremities, bilateral lower extremities, hands, feet, fingers, toes, fingernails, and toenails. All findings within normal limits unless otherwise noted below.   Relevant physical exam findings are noted in the Assessment and Plan.  left nasal tip 0.6 cm indistinct light pink macule   Assessment & Plan   SKIN CANCER SCREENING PERFORMED TODAY.  ACTINIC DAMAGE - Chronic condition, secondary to cumulative UV/sun exposure - diffuse scaly erythematous macules with underlying dyspigmentation - Recommend daily broad spectrum sunscreen SPF 30+ to sun-exposed areas, reapply every 2 hours as needed.  - Staying in the shade or wearing long sleeves, sun glasses (UVA+UVB protection) and wide brim hats (4-inch brim around the entire circumference of the hat) are also recommended for sun protection.  - Call for new or changing lesions.  LENTIGINES, SEBORRHEIC KERATOSES (including R foot dorsum), HEMANGIOMAS - Benign normal skin lesions - Benign-appearing - Call for any  changes  MELANOCYTIC NEVI - Tan-brown and/or pink-flesh-colored symmetric macules and papules - 4.0 mm speckled thin brown papule at left lateral knee - Benign appearing on exam today - Observation - Call clinic for new or changing moles - Recommend daily use of broad spectrum spf 30+ sunscreen to sun-exposed areas.   HISTORY OF BASAL CELL CARCINOMA OF THE SKIN Right lower pretibia 10/2010 - No evidence of recurrence today - Recommend regular full body skin exams - Recommend daily broad spectrum sunscreen SPF 30+ to sun-exposed areas, reapply every 2 hours as needed.  - Call if any new or changing lesions are noted between office visits   HISTORY OF DYSPLASTIC NEVUS Spinal upper back, severe, exc 01/15/2022 No evidence of recurrence today Recommend regular full body skin exams Recommend daily broad spectrum sunscreen SPF 30+ to sun-exposed areas, reapply every 2 hours as needed.  Call if any new or changing lesions are noted between office visits   HISTORY OF MELANOMA IN SITU Right labia minora, exc 02/12/23, Dr Hoy Eldonna MICAEL Darra (gynecologic oncology) Being followed every 3 months. Recurrence 12/23/2023, excised 02/11/2024, margins clear. Pt states has had recent biopsies and they were all negative. - Not examined today. Pt is followed by gynecology q3 mos. - Recommend regular full body skin exams - Recommend daily broad spectrum sunscreen SPF 30+ to sun-exposed areas, reapply every 2 hours as needed.  - Call if any new or changing lesions are noted between office visits   HISTORY OF SQUAMOUS CELL CARCINOMA OF THE SKIN Right leg - No evidence of recurrence today - Recommend regular full body skin exams - Recommend daily broad spectrum sunscreen SPF 30+ to  sun-exposed areas, reapply every 2 hours as needed.  - Call if any new or changing lesions are noted between office visits   ROSACEA Exam Mid face erythema with telangiectasias.   Chronic and persistent condition with  duration or expected duration over one year. Condition is symptomatic/ bothersome to patient. Not currently at goal.  Rosacea is a chronic progressive skin condition usually affecting the face of adults, causing redness and/or acne bumps. It is treatable but not curable. It sometimes affects the eyes (ocular rosacea) as well. It may respond to topical and/or systemic medication and can flare with stress, sun exposure, alcohol, exercise, topical steroids (including hydrocortisone /cortisone 10) and some foods.  Daily application of broad spectrum spf 30+ sunscreen to face is recommended to reduce flares.  Patient denies grittiness of the eyes  Treatment Plan Patient tried and failed metronidazole  0.75% cream and Finacea  gel. She uses makeup to cover redness.  Counseling for BBL / IPL / Laser and Coordination of Care Discussed the treatment option of Broad Band Light (BBL) /Intense Pulsed Light (IPL)/ Laser for skin discoloration, including brown spots and redness.  Typically we recommend at least 1-3 treatment sessions about 5-8 weeks apart for best results.  Cannot have tanned skin when BBL performed, and regular use of sunscreen/photoprotection is advised after the procedure to help maintain results. The patient's condition may also require maintenance treatments in the future.  The fee for BBL / laser treatments is $350 per treatment session for the whole face.  A fee can be quoted for other parts of the body.  Insurance typically does not pay for BBL/laser treatments and therefore the fee is an out-of-pocket cost. Recommend prophylactic valtrex  treatment. Once scheduled for procedure, will send Rx in prior to patient's appointment.   POROKERATOSIS Exam: 0.8 cm pink tan macule with keratotic rim at the right anterior ankle.  Treatment Plan: Benign-appearing.  Observation.  Call clinic for new or changing lesions.  Recommend daily use of broad spectrum spf 30+ sunscreen to sun-exposed areas.      NEOPLASM OF UNCERTAIN BEHAVIOR OF SKIN left nasal tip Skin / nail biopsy Type of biopsy: tangential   Informed consent: discussed and consent obtained   Patient was prepped and draped in usual sterile fashion: Area prepped with alcohol. Anesthesia: the lesion was anesthetized in a standard fashion   Local anesthetic: bupivacaine  0.5% Instrument used: flexible razor blade   Hemostasis achieved with: pressure, aluminum chloride and electrodesiccation   Outcome: patient tolerated procedure well   Post-procedure details: wound care instructions given   Post-procedure details comment:  Ointment and small bandage applied  Specimen 1 - Surgical pathology Differential Diagnosis: Sebaceous Hyperplasia r/o BCC Check Margins: No Return in about 6 months (around 04/06/2025) for TBSE, Hx melanoma, Hx BCC, Hx SCC.  IAndrea Kerns, CMA, am acting as scribe for Rexene Rattler, MD .   Documentation: I have reviewed the above documentation for accuracy and completeness, and I agree with the above.  Rexene Rattler, MD

## 2024-10-07 DIAGNOSIS — I480 Paroxysmal atrial fibrillation: Secondary | ICD-10-CM | POA: Diagnosis not present

## 2024-10-08 DIAGNOSIS — K219 Gastro-esophageal reflux disease without esophagitis: Secondary | ICD-10-CM | POA: Diagnosis not present

## 2024-10-08 DIAGNOSIS — Z79890 Hormone replacement therapy (postmenopausal): Secondary | ICD-10-CM | POA: Diagnosis not present

## 2024-10-08 DIAGNOSIS — I129 Hypertensive chronic kidney disease with stage 1 through stage 4 chronic kidney disease, or unspecified chronic kidney disease: Secondary | ICD-10-CM | POA: Diagnosis not present

## 2024-10-08 DIAGNOSIS — E039 Hypothyroidism, unspecified: Secondary | ICD-10-CM | POA: Diagnosis not present

## 2024-10-08 DIAGNOSIS — G4700 Insomnia, unspecified: Secondary | ICD-10-CM | POA: Diagnosis not present

## 2024-10-08 DIAGNOSIS — I2510 Atherosclerotic heart disease of native coronary artery without angina pectoris: Secondary | ICD-10-CM | POA: Diagnosis not present

## 2024-10-08 DIAGNOSIS — Z79899 Other long term (current) drug therapy: Secondary | ICD-10-CM | POA: Diagnosis not present

## 2024-10-08 DIAGNOSIS — I099 Rheumatic heart disease, unspecified: Secondary | ICD-10-CM | POA: Diagnosis not present

## 2024-10-08 DIAGNOSIS — Z86006 Personal history of melanoma in-situ: Secondary | ICD-10-CM | POA: Diagnosis not present

## 2024-10-08 DIAGNOSIS — N189 Chronic kidney disease, unspecified: Secondary | ICD-10-CM | POA: Diagnosis not present

## 2024-10-08 DIAGNOSIS — I252 Old myocardial infarction: Secondary | ICD-10-CM | POA: Diagnosis not present

## 2024-10-08 DIAGNOSIS — F319 Bipolar disorder, unspecified: Secondary | ICD-10-CM | POA: Diagnosis not present

## 2024-10-08 DIAGNOSIS — M25561 Pain in right knee: Secondary | ICD-10-CM | POA: Diagnosis not present

## 2024-10-08 DIAGNOSIS — I700 Atherosclerosis of aorta: Secondary | ICD-10-CM | POA: Diagnosis not present

## 2024-10-08 DIAGNOSIS — D6949 Other primary thrombocytopenia: Secondary | ICD-10-CM | POA: Diagnosis not present

## 2024-10-08 DIAGNOSIS — I3489 Other nonrheumatic mitral valve disorders: Secondary | ICD-10-CM | POA: Diagnosis not present

## 2024-10-08 DIAGNOSIS — E1122 Type 2 diabetes mellitus with diabetic chronic kidney disease: Secondary | ICD-10-CM | POA: Diagnosis not present

## 2024-10-08 DIAGNOSIS — D6869 Other thrombophilia: Secondary | ICD-10-CM | POA: Diagnosis not present

## 2024-10-08 DIAGNOSIS — K5900 Constipation, unspecified: Secondary | ICD-10-CM | POA: Diagnosis not present

## 2024-10-08 DIAGNOSIS — M1711 Unilateral primary osteoarthritis, right knee: Secondary | ICD-10-CM | POA: Diagnosis not present

## 2024-10-08 DIAGNOSIS — J301 Allergic rhinitis due to pollen: Secondary | ICD-10-CM | POA: Diagnosis not present

## 2024-10-08 DIAGNOSIS — F4322 Adjustment disorder with anxiety: Secondary | ICD-10-CM | POA: Diagnosis not present

## 2024-10-08 DIAGNOSIS — Z7901 Long term (current) use of anticoagulants: Secondary | ICD-10-CM | POA: Diagnosis not present

## 2024-10-08 DIAGNOSIS — I272 Pulmonary hypertension, unspecified: Secondary | ICD-10-CM | POA: Diagnosis not present

## 2024-10-08 DIAGNOSIS — Z8544 Personal history of malignant neoplasm of other female genital organs: Secondary | ICD-10-CM | POA: Diagnosis not present

## 2024-10-08 DIAGNOSIS — I4430 Unspecified atrioventricular block: Secondary | ICD-10-CM | POA: Diagnosis not present

## 2024-10-09 LAB — SURGICAL PATHOLOGY

## 2024-10-12 ENCOUNTER — Ambulatory Visit: Payer: Self-pay | Admitting: Dermatology

## 2024-10-12 ENCOUNTER — Encounter: Payer: Self-pay | Admitting: Dermatology

## 2024-10-12 DIAGNOSIS — C44311 Basal cell carcinoma of skin of nose: Secondary | ICD-10-CM

## 2024-10-12 NOTE — Telephone Encounter (Signed)
-----   Message from Rexene Rattler sent at 10/12/2024 11:20 AM EST ----- 1. Skin, left nasal tip :       SUPERFICIAL AND NODULAR BASAL CELL CARCINOMA   BCC skin cancer- recommend EDC vrs Mohs surgery with Dr. Corey.  If pt wants to come in for visit to discuss treatment options in more detail, please schedule - please call patient  EDC would leave a round depressed whitish scar about the same size as the original lesion.  It is treated here in office in a procedure we call scrape and burn.  No further pathology would be  performed.  Mid to high 80% cure rate.  Mohs would leave a linear scar and pathology would be done at time of procedure to ensure complete removal.  It has a high 90s% cure rate. We would refer  patient to a specialist for Mohs surgery.  ----- Message ----- From: Interface, Lab In [facility] Zero One Sent: 10/09/2024   5:34 PM EST To: Rexene Rattler, MD

## 2024-10-12 NOTE — Telephone Encounter (Signed)
 Advised pt of bx results.  Discussed treatment options EDC vs Mohs.  Patient prefers mohs with Dr. Paci.  Referral placed.

## 2024-10-15 ENCOUNTER — Ambulatory Visit: Admitting: Podiatry

## 2024-10-19 DIAGNOSIS — M1711 Unilateral primary osteoarthritis, right knee: Secondary | ICD-10-CM | POA: Diagnosis not present

## 2024-10-21 ENCOUNTER — Ambulatory Visit: Payer: Self-pay

## 2024-10-21 NOTE — Telephone Encounter (Signed)
 Appt scheduled with PCP on Friday 10/23/24

## 2024-10-21 NOTE — Telephone Encounter (Signed)
 Will see patient then Agree with ER and UC precautions

## 2024-10-21 NOTE — Telephone Encounter (Signed)
 FYI Only or Action Required?: FYI only for provider: appointment scheduled on 10/23/24.  Patient was last seen in primary care on 09/28/2024 by Randeen Laine LABOR, MD.  Called Nurse Triage reporting Knee Pain.  Symptoms began several days ago.  Interventions attempted: OTC medications: Tylenol .  Symptoms are: unchanged.  Triage Disposition: See PCP When Office is Open (Within 3 Days)  Patient/caregiver understands and will follow disposition?: Yes   Copied from CRM [id number]. Topic: Clinical - Red Word Triage >> Oct 21, 2024  2:28 PM Suzen RAMAN wrote: Red Word that prompted transfer to Nurse Triage: knee pain; requesting an appt with Dr. Copland[facility] for referral placement for knee replacement Reason for Disposition  [1] MODERATE pain (e.g., interferes with normal activities, limping) AND [2] present > 3 days  Answer Assessment - Initial Assessment Questions Patient reports inability to walk on the leg, using a walker since Sunday. She takes Tylenol  arthritis every 8 hours for the pain. She says she has swelling localized to the right knee, pain to the back of the knee worse when walking. She says that [facility] told her she needs knee surgery, so she wants to talk with the provider about a referral to a surgeon. She asked for an afternoon appointment tomorrow, advised no available afternoon appointments, she agreed to Friday morning with PCP.    1. LOCATION and RADIATION: Where is the pain located?      Right knee  2. QUALITY: What does the pain feel like?  (e.g., sharp, dull, aching, burning)     Sharp  3. SEVERITY: How bad is the pain? What does it keep you from doing?   (Scale 1-10; or mild, moderate, severe)     9 when trying to walk, no pain while sitting, 7 when standing  4. ONSET: When did the pain start? Does it come and go, or is it there all the time?     Friday pain started back   5. RECURRENT: Have you had this pain before? If Yes, ask: When,  and what happened then?     Yes, prednisone , visceral injection by [facility]  6. SETTING: Has there been any recent work, exercise or other activity that involved that part of the body?      Walking in the mall, stood on Sunday at church, then unable to walk at all, no injury  7. AGGRAVATING FACTORS: What makes the knee pain worse? (e.g., walking, climbing stairs, running)     Walking makes it worse  8. ASSOCIATED SYMPTOMS: Is there any swelling or redness of the knee?     Yes swelling, no redness  9. OTHER SYMPTOMS: Do you have any other symptoms? (e.g., calf pain, chest pain, difficulty breathing, fever)     Swelling to the knee only  Protocols used: Knee Pain-A-AH

## 2024-10-22 ENCOUNTER — Ambulatory Visit: Admitting: Podiatry

## 2024-10-23 ENCOUNTER — Ambulatory Visit (INDEPENDENT_AMBULATORY_CARE_PROVIDER_SITE_OTHER): Admitting: Family Medicine

## 2024-10-23 ENCOUNTER — Encounter: Payer: Self-pay | Admitting: Family Medicine

## 2024-10-23 VITALS — BP 130/74 | HR 70 | Temp 97.7°F | Ht 60.0 in | Wt 164.0 lb

## 2024-10-23 DIAGNOSIS — M170 Bilateral primary osteoarthritis of knee: Secondary | ICD-10-CM

## 2024-10-23 DIAGNOSIS — I480 Paroxysmal atrial fibrillation: Secondary | ICD-10-CM

## 2024-10-23 NOTE — Patient Instructions (Addendum)
 Continue Tylenol  as needed  Voltaren  gel  Ice   Get back to [facility] on 11/24 for your shot/ get a new brace   Bio freeze is fine as well    I put the referral in for orthopedics in [place]  Please let us  know if you don't hear in 1-2 weeks to set that up (mychart message or call or letter)   Take care of yourself

## 2024-10-23 NOTE — Assessment & Plan Note (Signed)
 Mod to severe pain  Reviewed ongoing records with [facility] in [place]  Pt wants referral to practice in [place]   Has had lubricant injection 5 mo ago (helped)  Prednisone  in oct (helped short term)  Getting cortisone shot 11/24  Needs knee replacement ultimately- will be on eliquis until January then hopefully off of it   Using a walker Encouraged ice /elevation when able  Voltaren  gel  Bio freeze Tylenol    Cannot have oral nsaids Intol of codeine in past Tramadol  causes severe constipatoin and also does not mix well with current medicine

## 2024-10-23 NOTE — Progress Notes (Signed)
 Subjective:    Patient ID: [name] [name], female    DOB: Jun 10, 1956, 68 y.o.   MRN: [id number]  HPI  Wt Readings from Last 3 Encounters:  10/23/24 164 lb (74.4 kg)  09/28/24 166 lb 4 oz (75.4 kg)  09/07/24 169 lb (76.7 kg)   32.03 kg/m  Vitals:   10/23/24 0848  BP: 130/74  Pulse: 70  Temp: 97.7 F (36.5 C)  SpO2: 98%    Pt presents for right knee pain   OA knee Needs knee replacement   Has to wait for that /be on eliquis before she can pause it (after watchman procedure)  Is on it until January     [facility] , then 11/10    (she wants to get to new ortho in [place])  Given steroid pack  (helped short term)  Had monovisc inj aug 2025  Discussed an injection before her trip -cruise  (dec 5th)  Has cortisone injection scheduled 11/24   Allergic to lidocaine  Codeine causes vomiting   Using walker  Cannot bear much weight on it    Tylenol  arthritis  Voltaren  gel occasionally  Ice wrap   Had some tramadol  left over from her vaginal surgery    Lab Results  Component Value Date   NA 140 05/25/2024   K 4.4 05/25/2024   CO2 28 05/25/2024   GLUCOSE 97 05/25/2024   BUN 18 05/25/2024   CREATININE 1.18 (H) 05/25/2024   CALCIUM 9.6 05/25/2024   GFR 54.46 (L) 10/29/2023   EGFR 51 (L) 05/25/2024   GFRNONAA 46 (L) 01/31/2024   Lab Results  Component Value Date   ALT 11 05/25/2024   AST 16 05/25/2024   ALKPHOS 69 06/29/2023   BILITOT 0.4 05/25/2024      Patient Active Problem List   Diagnosis Date Noted   Abrasion, forearm w/o infection 09/28/2024   Presence of Watchman left atrial appendage closure device 09/28/2024   Constipation 09/28/2024   Purpura senilis 09/03/2024   Knee pain, right 06/01/2024   Rosacea 04/02/2024   Pulmonary hypertension, unspecified (HCC) 12/31/2023   Edema of lower extremity 12/31/2023   Chronic kidney disease, stage 3a (HCC) 12/31/2023   NSTEMI (non-ST elevated myocardial infarction) (HCC)  10/22/2023   S/P ablation of atrial fibrillation 09/30/2023   Chronic anticoagulation 09/30/2023   Pain of left hip 09/11/2023   History of anal fissures 09/09/2023   Osteopenia 07/30/2023   Headache 07/05/2023   Paroxysmal atrial fibrillation (HCC) 06/27/2023   Bradycardia 05/30/2023   Melanoma in situ (HCC) 12/25/2022   ASCUS of cervix with negative high risk HPV 06/25/2022   Osteoarthritis of knee 05/25/2022   Hemorrhoid 04/25/2022   Estrogen deficiency 01/02/2022   Bipolar disorder, in partial remission, most recent episode mixed (HCC) 02/25/2020   Dyspnea 01/19/2020   Prediabetes 03/03/2018   Atrophic vaginitis 07/08/2017   Cervical stenosis (uterine cervix) 01/03/2017   Dry cough 04/18/2016   Hearing loss 10/25/2015   Routine general medical examination at a health care facility 10/17/2015   Facial rash 02/22/2015   Colon cancer screening 10/16/2013   Enthesopathy of ankle and tarsus 12/01/2009   Enthesopathy of ankle and tarsus 12/01/2009   Asymptomatic postmenopausal status 09/30/2009   Arthropathy of pelvic region and thigh 03/10/2009   DEGENERATIVE DISC DISEASE, LUMBAR SPINE 03/10/2009   DDD (degenerative disc disease), lumbosacral 03/10/2009   ONYCHOMYCOSIS 09/17/2008   Hypothyroidism 08/05/2007   Class 2 severe obesity due to excess calories with serious  comorbidity and body mass index (BMI) of 35.0 to 35.9 in adult 08/05/2007   Bipolar disorder (HCC) 08/05/2007   Allergic rhinitis 08/05/2007   Irritable bowel syndrome 08/05/2007   Esophageal reflux 08/05/2007   HYPERCHOLESTEROLEMIA, PURE 07/07/2007   Past Medical History:  Diagnosis Date   Actinic keratosis 03/29/2021   R central upper lip   Allergic rhinitis    Allergy    Anemia 10/29/2023   Anxiety    Arthritis    Atypical mole 12/26/2021   spinal upper back, exc 01/15/22   Basal cell carcinoma 10/16/2010   R lower pretibia    Basal cell carcinoma 10/06/2024   left nasal tip, referral sent to Dr.  Corey for mohs   Bipolar 1 disorder (HCC)    Chronic kidney disease    Chronic kidney disease    Depression    Dyspnea    Fissure, anal    GERD (gastroesophageal reflux disease)    History of basal cell carcinoma excision    ON FACE   History of squamous cell carcinoma excision    ON RIGHT LEG   Hyperlipidemia    Hypothyroidism    IBS (irritable bowel syndrome)    Liver cyst    Malignant melanoma in situ (HCC) 11/20/2022   Right labia minora. Excision 12/25/2022 at anterior vulva, left of clitoral hood, margins involved. Surgery scheduled for 02/12/2023. Recurrence 12/23/2023, excised 02/11/2024, margins clear.   Mallet finger of left hand    small finger   NSTEMI (non-ST elevated myocardial infarction) (HCC) 10/22/2023   Pneumonia    Pre-diabetes    Past Surgical History:  Procedure Laterality Date   ABLATION     CARDIAC CATHETERIZATION  04/10/1999   per pt normal   CARDIOVASCULAR STRESS TEST  02-23-2011  dr vinie fath [facility])   normal nuclear study/  no ischemia/  ef 64%   CARDIOVERSION N/A 01/10/2024   Procedure: CARDIOVERSION;  Surgeon: Hilarie Rocher, MD;  Location: [facility] OR[facility];  Service: Cardiovascular;  Laterality: N/A;   CATARACT EXTRACTION W/ INTRAOCULAR LENS  IMPLANT, BILATERAL  12/10/2001   Bil   CESAREAN SECTION  12/10/1982   CLOSED REDUCTION FINGER WITH PERCUTANEOUS PINNING Left 04/22/2014   Procedure: LEFT SMALL FINGER CLOSED REDUCTION PINNING;  Surgeon: Prentice LELON Pagan, MD;  Location: [facility];  Service: Orthopedics;  Laterality: Left;   COLONOSCOPY  11/09/2002   DILATION AND CURETTAGE OF UTERUS  1977   LAPAROSCOPIC CHOLECYSTECTOMY  03/18/2000   RECONSTRUCTION OF EYELID Bilateral 08/2022   RIGHT/LEFT HEART CATH AND CORONARY ANGIOGRAPHY Bilateral 11/25/2023   Procedure: RIGHT/LEFT HEART CATH AND CORONARY ANGIOGRAPHY;  Surgeon: Florencio Cara BIRCH, MD;  Location: [facility];  Service: Cardiovascular;  Laterality: Bilateral;   TEE  WITHOUT CARDIOVERSION N/A 01/10/2024   Procedure: TRANSESOPHAGEAL ECHOCARDIOGRAM (TEE);  Surgeon: Hilarie Rocher, MD;  Location: [facility];  Service: Cardiovascular;  Laterality: N/A;   TRANSTHORACIC ECHOCARDIOGRAM  02/23/2011   normal lvf/  ef 58%/  mild rve/   mild lae/  mild  mr  & tr/  mild pulmonary htn   VULVECTOMY N/A 12/25/2022   Procedure: PARTIAL  VULVECTOMY;  Surgeon: Eldonna Mays, MD;  Location: [facility];  Service: Gynecology;  Laterality: N/A;   VULVECTOMY N/A 02/12/2023   Procedure: WIDE LOCAL EXCISION OF VULVA AND REMOVAL OF CLITORIS;  Surgeon: Eldonna Mays, MD;  Location: [facility];  Service: Gynecology;  Laterality: N/A;   VULVECTOMY N/A 02/11/2024   Procedure: WIDE EXCISION VULVECTOMY;  Surgeon: Eldonna Mays, MD;  Location: [facility];  Service: Gynecology;  Laterality: N/A;   WISDOM TOOTH EXTRACTION     Social History   Tobacco Use   Smoking status: Never   Smokeless tobacco: Never  Vaping Use   Vaping status: Never Used  Substance Use Topics   Alcohol use: Not Currently    Comment: Only on cruises or rarely   Drug use: No   Family History  Problem Relation Age of Onset   Stroke Mother    Heart disease Mother    Hypertension Father    Diabetes Father    Hyperlipidemia Father    Breast cancer Sister    Cancer Sister    Breast cancer Sister    Diabetes Brother    Alcohol abuse Sister    Arthritis Sister    Mental illness Sister    Colon cancer Neg Hx    Ovarian cancer Neg Hx    Endometrial cancer Neg Hx    Pancreatic cancer Neg Hx    Prostate cancer Neg Hx    Allergies  Allergen Reactions   Ingrezza [Valbenazine Tosylate] Other (See Comments)    Night sweats, worsened tardive dyskinesia, nightmares   Lidocaine  Swelling    ALL CAINE DRUGS EXCEPT: marcaine  & sensercaine    Xylocaine  [Lidocaine  Hcl] Swelling    ALLERGIC TO ALL CAINES EXCEPT SENSORCAINE  , AND MARCAINE    Albuterol Other (See Comments)    Leg  swelling   Amantadine     Muscle pain and tremors    Austedo Xr [Deutetrabenazine]     Caused Muscle aches and tremors   Codeine Nausea And Vomiting    Vomits blood   Lithium Swelling   Singulair [Montelukast] Swelling    Leg swelling   Tegretol [Carbamazepine] Other (See Comments)    skin crawls   Current Outpatient Medications on File Prior to Visit  Medication Sig Dispense Refill   acetaminophen  (TYLENOL ) 650 MG CR tablet Take 1,300 mg by mouth every 8 (eight) hours as needed for pain.     apixaban (ELIQUIS) 5 MG TABS tablet Take 5 mg by mouth 2 (two) times daily.     ARIPiprazole  (ABILIFY ) 10 MG tablet Take 10 mg by mouth at bedtime.     Biotin 5000 MCG CAPS Take 5,000 mcg by mouth daily.     buPROPion  (WELLBUTRIN  XL) 300 MG 24 hr tablet Take 300 mg by mouth daily.     cetirizine (ZYRTEC ALLERGY) 10 MG tablet Take 10 mg by mouth daily.     cholecalciferol (VITAMIN D3) 25 MCG (1000 UNIT) tablet Take 1,000 Units by mouth daily.     clonazePAM  (KLONOPIN ) 1 MG tablet Take 1 mg by mouth at bedtime.     Coenzyme Q10 (COQ10) 100 MG CAPS Take 100 mg by mouth at bedtime.     famotidine  (PEPCID ) 20 MG tablet TAKE 1 TABLET TWICE DAILY 180 tablet 1   fluticasone  (FLONASE ) 50 MCG/ACT nasal spray Place 2 sprays into both nostrils daily. 48 g 3   JARDIANCE 10 MG TABS tablet Take 10 mg by mouth daily.     lamoTRIgine  (LAMICTAL ) 200 MG tablet Take 200 mg by mouth at bedtime.     levothyroxine  (SYNTHROID ) 50 MCG tablet TAKE 1 TABLET EVERY DAY BEFORE BREAKFAST 90 tablet 2   lisinopril  (ZESTRIL ) 2.5 MG tablet Take 1 tablet (2.5 mg total) by mouth daily. 30 tablet 0   lovastatin  (MEVACOR ) 40 MG tablet TAKE 1 TABLET EVERY DAY 90 tablet 2  nystatin  powder Apply 1 Application topically 3 (three) times daily as needed.     torsemide (DEMADEX) 20 MG tablet Take 20 mg by mouth daily.     traZODone (DESYREL) 50 MG tablet Take 50-150 mg by mouth at bedtime as needed. (Patient not taking: Reported on  10/23/2024)     No current facility-administered medications on file prior to visit.    Review of Systems  Constitutional:  Positive for fatigue.  Musculoskeletal:  Positive for arthralgias.  Neurological:  Negative for weakness and numbness.       Objective:   Physical Exam Constitutional:      General: She is not in acute distress.    Appearance: Normal appearance. She is well-developed. She is obese. She is not ill-appearing or diaphoretic.  HENT:     Head: Normocephalic and atraumatic.  Eyes:     Conjunctiva/sclera: Conjunctivae normal.     Pupils: Pupils are equal, round, and reactive to light.  Neck:     Thyroid : No thyromegaly.     Vascular: No carotid bruit or JVD.  Cardiovascular:     Rate and Rhythm: Normal rate and regular rhythm.     Heart sounds: Normal heart sounds.     No gallop.     Comments: Reg rhythm today Pulmonary:     Effort: Pulmonary effort is normal. No respiratory distress.     Breath sounds: Normal breath sounds. No wheezing or rales.  Abdominal:     General: There is no distension or abdominal bruit.     Palpations: Abdomen is soft.  Musculoskeletal:     Cervical back: Normal range of motion and neck supple.     Right lower leg: No edema.     Left lower leg: No edema.     Comments: Right knee  Some swelling Crepitus Limited rom due to pain  Cannot bear full weight on it due to pain -using walker   Anterior and posterior tenderness   Lymphadenopathy:     Cervical: No cervical adenopathy.  Skin:    General: Skin is warm and dry.     Coloration: Skin is not pale.     Findings: No rash.  Neurological:     Mental Status: She is alert.     Coordination: Coordination normal.     Deep Tendon Reflexes: Reflexes are normal and symmetric. Reflexes normal.  Psychiatric:        Mood and Affect: Mood normal.           Assessment & Plan:   Problem List Items Addressed This Visit       Cardiovascular and Mediastinum   Paroxysmal  atrial fibrillation (HCC)   Has watchman device -doing well Hopes to get off eliquis in January         Musculoskeletal and Integument   Osteoarthritis of knee - Primary   Mod to severe pain  Reviewed ongoing records with [facility] in [place]  Pt wants referral to practice in [place]   Has had lubricant injection 5 mo ago (helped)  Prednisone  in oct (helped short term)  Getting cortisone shot 11/24  Needs knee replacement ultimately- will be on eliquis until January then hopefully off of it   Using a walker Encouraged ice /elevation when able  Voltaren  gel  Bio freeze Tylenol    Cannot have oral nsaids Intol of codeine in past Tramadol  causes severe constipatoin and also does not mix well with current medicine       Relevant Orders  Ambulatory referral to Orthopedic Surgery

## 2024-10-23 NOTE — Assessment & Plan Note (Signed)
 Has watchman device -doing well Hopes to get off eliquis in January

## 2024-10-26 DIAGNOSIS — I480 Paroxysmal atrial fibrillation: Secondary | ICD-10-CM | POA: Diagnosis not present

## 2024-11-03 ENCOUNTER — Telehealth: Payer: Self-pay | Admitting: Family Medicine

## 2024-11-03 NOTE — Telephone Encounter (Signed)
 Copied from CRM [id number]. Topic: Referral - Status >> Nov 03, 2024  1:55 PM Robinson H wrote: Reason for CRM: Patient calling to follow up on referral to Orthopedic doctor shows pending in system.  [place] [phone]

## 2024-11-11 DIAGNOSIS — M1711 Unilateral primary osteoarthritis, right knee: Secondary | ICD-10-CM | POA: Diagnosis not present

## 2024-11-24 ENCOUNTER — Ambulatory Visit: Admitting: Psychology

## 2024-11-24 DIAGNOSIS — F3181 Bipolar II disorder: Secondary | ICD-10-CM | POA: Diagnosis not present

## 2024-11-24 DIAGNOSIS — F4322 Adjustment disorder with anxiety: Secondary | ICD-10-CM | POA: Diagnosis not present

## 2024-11-24 NOTE — Progress Notes (Unsigned)
 [facility] Counselor/Therapist Progress Note  Patient ID: TAMIKO LEOPARD, MRN: [id number],    Date: 11/24/2024  Time Spent: 49 minutes  Time in: 2:04 Time out 2:53  Treatment Type: Individual Therapy  Reported Symptoms: none  Mental Status Exam: Appearance:  Casual     Behavior: Appropriate  Motor: Normal  Speech/Language:  normal  Affect: Appropriate  Mood: pleasant  Thought process: normal  Thought content:   WNL  Sensory/Perceptual disturbances:   WNL  Orientation: oriented to person, place, time/date, and situation  Attention: Good  Concentration: Good  Memory: WNL  Fund of knowledge:  Good  Insight:   Good  Judgment:  Good  Impulse Control: Good   Risk Assessment: Danger to Self:  No Self-injurious Behavior: No Danger to Others: No Duty to Warn:no Physical Aggression / Violence:No  Access to Firearms a concern: No  Gang Involvement:No   Subjective: The patient attended a face-to-face individual therapy session via video visit today.  The patient agreed and consented for this session to be on caregility and patient is aware of limitations of telehealth.  The patient was in her home alone and the therapist was in the office.  The patient presents with a blunted affect and mood is sent.  The patient reports that a lot has happened since I saw her last.  She says that she and her husband went on a cruise and she ended up having to do the cruise in a wheelchair.  She reports that she is going to have to have knee replacement surgery coming up.  In addition she had a Watchman procedure and she has had plenty of doctors appointments since I saw her last.  She has also lost 70 pounds and I think that is going to be helpful when she does have to have surgery on her knees.  She and her husband seem to be managing things and we are going to refer her to another therapist to do check-in's periodically for her.  I will we will talk to that therapist and were hoping to  refer her to the therapist at [facility].  We did have to do a little bit of rearranging for appointments so that I can continue to see her until I leave in March and she can be transferred to the other therapist.  Interventions: Cognitive Behavioral Therapy, meditation and mindfulness  Diagnosis:Bipolar II disorder (HCC)  Adjustment disorder with anxious mood  Plan: Client Abilities/Strengths  intelligent, insightful  Client Treatment Preferences  Outpatient individual therapy  Client Statement of Needs  I need someone to help monitor and manage my bipolar  Treatment Level  Outpatient individual therapy  Symptoms  Depressed or irritable mood.: (Status: improved). Diminished interest in or  enjoyment of activities.: (Status: improved). Displays a poor attention span and is easily distracted.:  (Status: improved). Exhibits an abnormally and  persistently elevated, expansive, or irritable mood with at least three symptoms of mania (i.e., inflated  self-esteem or grandiosity, decreased need for sleep, pressured speech, flight of ideas, distractibility,  excessive goal-directed activity or psychomotor agitation, excessive involvement in pleasurable, highrisk behavior).:  (Status: improved). Feelings of hopelessness, worthlessness, or inappropriate guilt.: (Status: improved). History of at least one hypomanic,  manic, or mixed mood episode.:  (Status: maintained). History of chronic or  recurrent depression for which the client has taken antidepressant medication, been hospitalized, had  outpatient treatment, or had a course of electroconvulsive therapy.:  (Status:  maintained). Lack of energy.: (Status: improved). Lacks follow-through  in  projects, even though energy is very high, since behavior lacks discipline and goal-directedness.:  (Status: improved). Poor concentration and indecisiveness.:  (Status: improved). Reports flight of ideas or thoughts racing.: (Status: improved). Shows  evidence of a decreased need for sleep.: (Status: improved).  Sleeplessness or hypersomnia.:  (Status: improved). Social withdrawal.: (Status: improved). Suicidal thoughts and/or gestures.:   (Status: improved). The elevated mood or irritability (mania) causes marked impairment in  occupational functioning, social activities, or relationships with others.: (Status: improved). Verbalizes grandiose ideas and/or persecutory beliefs.:  (Status: improved).  Problems Addressed  Bipolar Disorder - Mania, Bipolar Disorder - Depression, Bipolar Disorder - Mania, Bipolar Disorder -  Mania, Bipolar Disorder - Depression, Bipolar Disorder - Depression, Bipolar Disorder - Mania  Goals 1. Achieve controlled behavior, moderated mood, more deliberative  speech and thought process, and a stable daily activity pattern. 2. Achieve controlled behavior, moderated mood, more deliberative  speech and thought process, and a stable daily activity pattern. 3. Alleviate manic/hypomanic mood and return to previous level of  effective functioning. 4. Develop healthy cognitive patterns and beliefs about self and the world  that lead to alleviation and help prevent the relapse of  manic/hypomanic episodes. Objective Maintain a pattern of regular rhythm to daily activities. Target Date: 2025-05-07 Frequency: Monthly Progress: 80 Modality: individual Related Interventions 1. Assist the client in establishing a more routine pattern of daily activities such as sleeping,  eating, solitary and social activities, and exercise; use and review a form to schedule, assess,  and modify these activities so that they occur in a predictable rhythm every day. Objective Identify and replace thoughts and behaviors that trigger manic or depressive symptoms. Target Date: 2025-05-07 Frequency: Monthly Progress: 80 Modality: individual Related Interventions 1. Use cognitive therapy techniques to explore and educate the client about  cognitive biases that  trigger his/her elevated or depressive mood (see Cognitive Therapy for Bipolar Disorder by  Lizette dunker al.). 2. Teach the client cognitive-behavioral coping and relapse prevention skills including delaying  impulsive actions, structured scheduling of daily activities, keeping a regular sleep routine,  avoiding unrealistic goal striving, using relaxation procedures, identifying and avoiding episode triggers such as stimulant drug use, alcohol consumption, breaking sleep routine, or exposing  self to high stress (see Cognitive Therapy for Bipolar Disorder by Lizette dunker al.). 3. Use cognitive therapy techniques to explore and educate the client's about cognitive biases that  trigger his/her elevated or depressive mood (see Cognitive Therapy for Bipolar Disorder by  Lizette dunker al.). Objective Discuss and resolve troubling personal and interpersonal issues. Target Date: 2025-05-07 Frequency: Monthly Progress: 60 Modality: individual Objective Participate in periodic maintenance sessions. Target Date: 2025-05-07 Frequency: Monthly Progress: 80 Modality: individual Related Interventions 1. Hold periodic maintenance sessions within the first few months after therapy to facilitate the  client's positive changes; problem-solve obstacles to improvement. 2. Hold periodic maintenance sessions within the first few months after therapy to facilitate the  client's positive changes; problem-solve obstacles to improvement. Objective Describe mood state, energy level, amount of control over thoughts, and sleeping pattern. Target Date: 2025-05-07 Frequency: Monthly Progress: 80 Modality: individual Related Interventions 1. Encourage the client to share his/her thoughts and feelings; express empathy and build rapport  while assessing primary cognitive, behavioral, interpersonal, or other symptoms of the mood  disorder. 2. Assess presence, severity, and impact of past and present mood  episodes on social,  occupational, and interpersonal functioning; supplement with semi-structured inventory, if  desired (e.g., Montgomery-Asberg Depression Rating Scale, Inventory to  Diagnose  Depression). 3. Encourage the client to share his/her thoughts and feelings; express empathy, and build rapport  while assessing primary cognitive, behavioral, interpersonal, or other symptoms of the mood  disorder. 4. Assess presence, severity, and impact of past and present mood episodes including mania (i.e.,  pressured speech, impulsive behavior, euphoric mood, flight of ideas, reduced need for sleep,  inflated self-esteem, and high energy) on social, occupational, and interpersonal functioning;  supplement with semi-structured inventory, if desired (e.g., Young Mania Rating Scale; the  Clinical Monitoring Form); re-administer as indicated to assess treatment response. 5. Develop healthy cognitive patterns and beliefs about self and the world  that lead to alleviation and help prevent the relapse of mood  episodes. 6. Normalize energy level and return to usual activities, good judgment,  stable mood, more realistic expectations, and goal-directed behavior. 7. Normalize energy level and return to usual activities, good judgment,  stable mood, more realistic expectations, and goal-directed behavior. Diagnosis 296.52 (Bipolar I disorder, most recent episode (or current) depressed, moderate)  Medications  1. Ambien  (Dosage: 10mg  qhs)  2. Klonopin  (Dosage: 1mg  TID)  3. Olanzapine (Dosage: 5 mg every other day and not on weekends)  4. Prozac (Dosage: 20 mg every other day and not on weekends)  5. Wellbutrin  (Dosage: 450 mg qd)  Conditions For Discharge Achievement of treatment goals and objectives  The patient approved this treatment plan and is making progress towards goals.  Jasimine Simms G Antonis Lor,  LCSW

## 2024-11-25 ENCOUNTER — Encounter: Payer: Self-pay | Admitting: Dermatology

## 2024-11-25 ENCOUNTER — Encounter: Payer: Self-pay | Admitting: *Deleted

## 2024-11-25 ENCOUNTER — Ambulatory Visit: Admitting: Dermatology

## 2024-11-25 VITALS — BP 118/66 | HR 65 | Temp 97.9°F

## 2024-11-25 DIAGNOSIS — L578 Other skin changes due to chronic exposure to nonionizing radiation: Secondary | ICD-10-CM

## 2024-11-25 DIAGNOSIS — C44311 Basal cell carcinoma of skin of nose: Secondary | ICD-10-CM | POA: Diagnosis not present

## 2024-11-25 DIAGNOSIS — L814 Other melanin hyperpigmentation: Secondary | ICD-10-CM

## 2024-11-25 DIAGNOSIS — C4491 Basal cell carcinoma of skin, unspecified: Secondary | ICD-10-CM

## 2024-11-25 MED ORDER — TRAMADOL HCL 50 MG PO TABS: 50.0000 mg | ORAL_TABLET | Freq: Four times a day (QID) | ORAL | 0 refills | Status: DC | PRN

## 2024-11-25 NOTE — Telephone Encounter (Signed)
 Referral was faxed to   [facility] [address] ([address]) [place], [place] [address] Phone: [phone]  Mychart letter and Mychart message sent to patient with Referral information.

## 2024-11-25 NOTE — Progress Notes (Signed)
 Follow-Up Visit   Subjective  [name] [name] is a 68 y.o. female who presents for the following: Mohs of a Superficial and Nodular Basal Cell Carcinoma on the left nasal tip, referred by Dr. Jackquline.  The following portions of the chart were reviewed this encounter and updated as appropriate: medications, allergies, medical history  Review of Systems:  No other skin or systemic complaints except as noted in HPI or Assessment and Plan.  Objective  Well appearing patient in no apparent distress; mood and affect are within normal limits.  A focused examination was performed of the following areas: Left nasal tip Relevant physical exam findings are noted in the Assessment and Plan.   Left Tip of Nose Biopsy scar   Assessment & Plan   BASAL CELL CARCINOMA (BCC), UNSPECIFIED SITE Left Tip of Nose - Mohs surgery  Consent obtained: written  Anticoagulation: Was the anticoagulation regimen changed prior to Mohs? No    Anesthesia: Anesthesia method: local infiltration Local anesthetic: bupivacaine  0.25% WITH epi  Procedure Details: Timeout: pre-procedure verification complete Procedure Prep: patient was prepped and draped in usual sterile fashion Biopsy accession number: [id number] Pre-Op diagnosis: basal cell carcinoma BCC subtype: nodular and superficial MohsAIQ Surgical site (if tumor spans multiple areas, please select predominant area): nose Surgery side: left Surgical site (from skin exam): Left Tip of Nose Pre-operative length (cm): 0.5 Pre-operative width (cm): 0.4 Indications for Mohs surgery: anatomic location where tissue conservation is critical  Micrographic Surgery Details: Post-operative length (cm): 1 Post-operative width (cm): 1 Number of Mohs stages: 1 Post surgery depth of defect: skeletal muscle  - Skin repair Complexity:  Complex Final length (cm):  3.5 (x 1.5) Subcutaneous layers (deep stitches):  Suture size:  5-0 Suture type: Vicryl  (polyglactin 910)   Fine/surface layer approximation (top stitches):  Suture size:  5-0 Suture type: Prolene (polypropylene)    This Visit - traMADol  (ULTRAM ) 50 MG tablet - Take 1 tablet (50 mg total) by mouth every 6 (six) hours as needed for up to 8 doses.  Pt states she has a severe allergy to lidocaine  where she swells up and blisters. She said that she has been numbed with bupivacaine  for all other surgeries since and that she has never had any contraindication with it ever. She was advised that both numbing agents are in the same class but she reiterated that she was able to use bupivacaine  safely, therefore she was given 1/4 diluted bupivacaine .   Return for 1 week wound check/suture removal, 1 month wound check.   11/25/2024  HISTORY OF PRESENT ILLNESS  [name] [name] is seen in consultation at the request of Dr. Jackquline for biopsy-proven Superficial and Nodular Basal Cell Carcinoma of the left nasal tip. They note that the area has been present for about 6 months increasing in size with time.  There is no history of previous treatment.  Reports no other new or changing lesions and has no other complaints today.  Medications and allergies: see patient chart.  Review of systems: Reviewed 8 systems and notable for the above skin cancer.  All other systems reviewed are unremarkable/negative, unless noted in the HPI. Past medical history, surgical history, family history, social history were also reviewed and are noted in the chart/questionnaire.    PHYSICAL EXAMINATION  General: Well-appearing, in no acute distress, alert and oriented x 4. Vitals reviewed in chart (if available).   Skin: Exam reveals a 0.5 x 0.4 cm erythematous papule and biopsy scar on the  left nasal tip. There are rhytids, telangiectasias, and lentigines, consistent with photodamage.   Biopsy report(s) reviewed, confirming the diagnosis.   ASSESSMENT  1) Superficial and Nodular Basal Cell Carcinoma of the  left nasal tip 2) photodamage 3) solar lentigines   PLAN   1. Due to location, size, histology, or recurrence and the likelihood of subclinical extension as well as the need to conserve normal surrounding tissue, the patient was deemed acceptable for Mohs micrographic surgery (MMS).  The nature and purpose of the procedure, associated benefits and risks including recurrence and scarring, possible complications such as pain, infection, and bleeding, and alternative methods of treatment if appropriate were discussed with the patient during consent. The lesion location was verified by the patient, by reviewing previous notes, pathology reports, and by photographs as well as angulation measurements if available.  Informed consent was reviewed and signed by the patient, and timeout was performed at 8:30 AM. See op note below.  2. For the photodamage and solar lentigines, sun protection discussed/information given on OTC sunscreens, and we recommend continued regular follow-up with primary dermatologist every 6 months or sooner for any growing, bleeding, or changing lesions. 3. Prognosis and future surveillance discussed. 4. Letter with treatment outcome sent to referring provider. 5. Pain acetaminophen /ibuprofen/tramadol  50 mg  MOHS MICROGRAPHIC SURGERY AND RECONSTRUCTION  Initial size:   0.5 x 0.4 cm Surgical defect/wound size: 1.0 x 1.0 cm Anesthesia:    0.33% lidocaine  with 1:200,000 epinephrine  EBL:    <5 mL Complications:  None Repair type:   Adjacent Tissue Transfer (advancement Flap) SQ suture:   5-0 Vicryl Cutaneous suture:  5-0 Polyprolene Final size of the repair: 3.5 x 1.5 = 5.25 cm^2  Stages: 1  STAGE I: Anesthesia achieved with 0.5% lidocaine  with 1:200,000 epinephrine . ChloraPrep applied. 1 section(s) excised using Mohs technique (this includes total peripheral and deep tissue margin excision and evaluation with frozen sections, excised and interpreted by the same physician). The  tumor was first debulked and then excised with an approx. 2mm margin.  Hemostasis was achieved with electrocautery as needed.  The specimen was then oriented, subdivided/relaxed, inked, and processed using Mohs technique.    Frozen section analysis revealed a clear deep and peripheral margin.  Reconstruction  PROCEDURE: Advancement Flap The nature of the procedure was discussed with the patient in detail, including alternatives.  The risks discussed included but not limited to potential for infection, bleeding, scar formation, and damage to underlying structures.  The patient understood the risks and signed the consent form (scanned into chart).  This wound was reconstructed with an advancement flap.  Local anesthesia was achieved with the anesthetic indicated above.  The operative site was prepped with a surgical antiseptic solution, and then draped with sterile towels to insure a sterile field.  The beveled edges of the wound were excised to 90 degrees relative to the surface skin plane.  The wound was undermined in all directions, and meticulous hemostasis was achieved with an electrosurgical device.  Relaxing incisions were made, if necessary, for lateral tension release.  The tissue was advanced and closed centrally, and redundant tissue was trimmed as necessary.  The wound was sutured in a layered fashion to close potential dead space and to precisely and securely approximate the wound edges.  The dimensions of the flap were:  3.5 cm x 1.5 cm for a total flap surface area of 5.25 centimeters squared (cm2).    The wound was covered with petrolatum and a dressing.  The patient  understands the need to return immediately for any signs of infection to include swelling, pain, purulent discharge, localized warmth, or fever.  Contact information was provided to the patient (including after-hours pager numbers)     Documentation: I have reviewed the above documentation for accuracy and completeness, and  I agree with the above.  RUFUS CHRISTELLA HOLY, MD

## 2024-11-25 NOTE — Patient Instructions (Signed)

## 2024-11-27 ENCOUNTER — Telehealth: Payer: Self-pay

## 2024-11-27 NOTE — Telephone Encounter (Signed)
 Patient had bleeding last night. Applied pressure for 20 minters and it did not stop. Applied for another 20 minutes and the bleeding stopped. She picked up a box and did some wrapping but reports that she has not done anything strenuous.  Patient states that she was feeling weak this morning but only has had a banana to eat. I advised the patient to have a good meal and if she still felt weak to contact her PCP. Patient verbalized understanding.

## 2024-11-30 ENCOUNTER — Inpatient Hospital Stay: Admission: RE | Admit: 2024-11-30 | Discharge: 2024-11-30 | Attending: Family Medicine | Admitting: Family Medicine

## 2024-11-30 ENCOUNTER — Other Ambulatory Visit: Payer: Self-pay | Admitting: Specialist

## 2024-11-30 DIAGNOSIS — Z1231 Encounter for screening mammogram for malignant neoplasm of breast: Secondary | ICD-10-CM

## 2024-11-30 DIAGNOSIS — R0609 Other forms of dyspnea: Secondary | ICD-10-CM

## 2024-11-30 DIAGNOSIS — R911 Solitary pulmonary nodule: Secondary | ICD-10-CM

## 2024-11-30 DIAGNOSIS — I272 Pulmonary hypertension, unspecified: Secondary | ICD-10-CM

## 2024-11-30 DIAGNOSIS — R0602 Shortness of breath: Secondary | ICD-10-CM

## 2024-11-30 DIAGNOSIS — J4520 Mild intermittent asthma, uncomplicated: Secondary | ICD-10-CM

## 2024-12-02 ENCOUNTER — Encounter: Payer: Self-pay | Admitting: Dermatology

## 2024-12-02 ENCOUNTER — Ambulatory Visit: Admitting: Dermatology

## 2024-12-02 DIAGNOSIS — L539 Erythematous condition, unspecified: Secondary | ICD-10-CM

## 2024-12-02 DIAGNOSIS — C4491 Basal cell carcinoma of skin, unspecified: Secondary | ICD-10-CM

## 2024-12-02 DIAGNOSIS — T1490XD Injury, unspecified, subsequent encounter: Secondary | ICD-10-CM

## 2024-12-02 DIAGNOSIS — Z85828 Personal history of other malignant neoplasm of skin: Secondary | ICD-10-CM

## 2024-12-02 DIAGNOSIS — L905 Scar conditions and fibrosis of skin: Secondary | ICD-10-CM

## 2024-12-02 MED ORDER — MUPIROCIN 2 % EX OINT: 1.0000 | TOPICAL_OINTMENT | Freq: Two times a day (BID) | CUTANEOUS | 1 refills | Status: AC

## 2024-12-02 NOTE — Progress Notes (Signed)
" ° °  Follow Up Visit   Subjective  [name] [name] is a 68 y.o. female who presents for the following: follow up from Mohs surgery   The patient presents for follow up from Mohs surgery for a BCC on the left tip of nose, treated on 11/25/24, repaired with advancement flap. The patient has been bandaging the wound as directed. The endorse the following concerns: bleeding from the nostril on 2nd day after as well as bleeding at top of wound.  The following portions of the chart were reviewed this encounter and updated as appropriate: medications, allergies, medical history  Review of Systems:  No other skin or systemic complaints except as noted in HPI or Assessment and Plan.  Objective  Well appearing patient in no apparent distress; mood and affect are within normal limits.  A focal examination was performed including scalp, head, face. All findings within normal limits unless otherwise noted below.  Healing wound with mild erythema     Relevant physical exam findings are noted in the Assessment and Plan.    Assessment & Plan    Healing Wound s/p Mohs for [name], treated on 11/25/24, repaired with advancement flap - Reassured that wound is healing well - No evidence of infection - No swelling, induration, purulence, dehiscence, or tenderness out of proportion to the clinical exam, see photo above - Discussed that scars take up to 12 months to mature from the date of surgery - Recommend SPF 30+ to scar daily to prevent purple color from UV exposure during scar maturation process - Discussed that erythema and raised appearance of scar will fade over the next 4-6 months - OK to start scar massage at 4-6 weeks post-op - Can consider silicone based products for scar healing starting at 6 weeks post-op - Ok to continue ointment daily to wound under a bandage for another 10 days - will send mupirocin    HISTORY OF BASAL CELL CARCINOMA OF THE SKIN - No evidence of recurrence today -  Recommend regular full body skin exams - Recommend daily broad spectrum sunscreen SPF 30+ to sun-exposed areas, reapply every 2 hours as needed.  - Call if any new or changing lesions are noted between office visits  Return in about 3 weeks (around 12/23/2024) for mohs follow up.  I, Darice Smock, CMA, am acting as scribe for RUFUS CHRISTELLA HOLY, MD.   Documentation: I have reviewed the above documentation for accuracy and completeness, and I agree with the above.  RUFUS CHRISTELLA HOLY, MD  "

## 2024-12-02 NOTE — Patient Instructions (Signed)

## 2024-12-07 ENCOUNTER — Ambulatory Visit: Payer: Self-pay | Admitting: Family Medicine

## 2024-12-07 ENCOUNTER — Inpatient Hospital Stay: Attending: Psychiatry | Admitting: Psychiatry

## 2024-12-07 ENCOUNTER — Encounter: Payer: Self-pay | Admitting: Psychiatry

## 2024-12-07 VITALS — BP 109/76 | HR 64 | Temp 98.7°F | Resp 19 | Wt 166.4 lb

## 2024-12-07 DIAGNOSIS — D038 Melanoma in situ of other sites: Secondary | ICD-10-CM | POA: Diagnosis not present

## 2024-12-07 DIAGNOSIS — C519 Malignant neoplasm of vulva, unspecified: Secondary | ICD-10-CM

## 2024-12-07 NOTE — Patient Instructions (Signed)
It was a pleasure to see you in clinic today. - Normal exam today - Return visit planned for 3 months  Thank you very much for allowing me to provide care for you today.  I appreciate your confidence in choosing our Gynecologic Oncology team at [facility].  If you have any questions about your visit today please call our office or send [place] a MyChart message and we will get back to you as soon as possible.

## 2024-12-07 NOTE — Progress Notes (Signed)
 Gynecologic Oncology Return Clinic Visit  Date of Service: 12/07/2024 Referring Provider: Harland Birkenhead, MD   Assessment & Plan: [name] [name] is a 68 y.o. woman with recurrent vulvar melanoma in situ who is s/p repeat wide local excision of the vulva on 02/11/24 (previously with excisions on 12/25/22 and 02/12/23). No residual disease on excision. Biopsies at last visit on 09/07/24 benign. Presents today for surveillance.  Melanoma in situ: - From original excisions in 2024, focal positive deep margin (one focal spot on 1 single slide). All other margins negative for MIS but positive for atypical lentiginous melanocytic hyperplasia (possible precursor lesion). - No residual melanoma in situ on excision from 02/11/24.  - Last visit on 09/07/24 with 2 biopsies, both benign (slight hyperpigmentation, negative for in situ or or invasive melanoma) - See by [facility] for 2nd opinion on 06/16/24. Recommend consideration of Moh's if recurrence. They noted that aldara can make it harder to perform Moh's. - Exam today without areas of concern, no biopsies taken. - Continue surveillance with pelvic exam q48mo with biopsies as indicated.    RTC 26mo  Hoy Masters, MD Gynecologic Oncology   Medical Decision Making I personally spent  TOTAL 15 minutes face-to-face and non-face-to-face in the care of this patient, which includes all pre, intra, and post visit time on the date of service.    ----------------------- Reason for Visit: Surveillance  Treatment History: Pt presented to her Ob/Gyn for evaluation of vulvar itching. She had noted itching the week prior, but this resolved with application of clobetasol  which she had for a diagnosis of lichen simplex chronicus. However, patient had also been previously noted by another provider to have a brown vulvar lesions of her right labia minora 12/2021 which was rechecked 03/2022 and noted to be stable with plan for continued yearly follow-up.    On 11/12/22,  her Ob/Gyn noted a 1cm brown lesion was noted at the top of her labia minora. Given the appearance of a melanotic lesion, she was recommended to return for a biopsy, which was completed on 11/20/22. Biopsy returned with melanoma in situ, edges involved. No invasive disease was seen.    She underwent a simple partial vulvectomy on 12/25/22 with path results: A. ANTERIOR VULVA, EXCISION: MELANOMA IN SITU, EXTENDING TO THE DEEP  SURGICAL MARGIN. THE PERIPHERAL MARGINS ARE NEGATIVE.  B. ANTERIOR VULVA, LEFT OF CLITORAL HOOD, EXCISION:  MELANOMA IN SITU  INVOLVING THE 12, 3, 6 AND 9 O'CLOCK MARGINS.    Given multiple positive margins, she returned to the OR on 02/12/23 for additional resection including a WLE of the anterior vulva including removal of remaining bilateral labia minora and removal of clitoris with pathology showing: A.   VULVA, ANTERIOR MARGIN, BIOPSY: NO MELANOMA IN SITU SEEN. COMMENT: Dr. Lorriane is in agreement. B.   VULVA, LEFT ANTERIOR MARGIN, BIOPSY: NO MELANOMA IN SITU SEEN. COMMENT: Dr. Lorriane is in agreement. C.   VULVA, LEFT MEDIAL MARGIN, BIOPSY: NO MELANOMA IN SITU SEEN. COMMENT: MelanA was used to evaluate this slide. Dr. Lorriane is in agreement D.   **PRELIMINARY DIAGNOSIS** VULVA, ANTERIOR WITH LEFT/RIGHT LABIA MINORA, EXCISION: -RESIDUAL MELANOMA IN SITU , MULTFOCAL RESIDUAL FOCI , INVOLVING BLACK/DEEP MARGIN (D2), -BACKGROUND ATYPICAL JUNCTIONAL MELANOCYTIC HYPERPLASIA INVOLVING MULTIFOCAL MARGINS (ANTERIOR, OUTER LEFT, INNER LEFT MARGINS INVOLVED WITH ATYPICAL JUNCTIONAL MELANOCYTIC HYPERPLASIA, SEE COMMENT) COMMENT: Sections show extensive areas of atypical lentiginous melanocytic hyperplasia involving the margins noted above, which may be a precursor lesion to the residual melanoma in situ present.  The atypical lentiginous melanocytic hyperplasia does not meet criteria for melanoma in situ. These atypical lentiginous foci are negative with PRAME, and exhibit lesser  cytological atypia on MITF staining. MelanA overstains many foci and is not contributory. However, residual melanoma in situ with focal upward (pagetoid) growth, severe atypia of melanocytes, and melanocytic confluence is seen in multifocal areas, but notably ONLY at the margin deep inked black (block D2). The remainder of the margins are free of melanoma in situ.  E.   VULVA, LEFT LATERAL MARGIN, EXCISION: ATYPICAL JUNCTIONAL MELANOCYTIC HYPERPLASIA, NO MELANOMA IN SITU COMMENT: No melanoma in situ is seen. PRAME, MelanA, and MITF were used to evaluate this margin. Dr. Lorriane has reviewed this case and is in agreement with the diagnoses rendered above. In managing this patient with extensive atypical lentiginous melanocytic hyperplasia, which likely represents a confluence of extensive melanotic macules and precursor disease to melanoma in situ, this reference article is provided: Oneda Betty HERO. May P. Candyce, Shitanshu Uppai: Margin status in vulvovaginal melanoma: Management and oncologic outcomes of 50 cases. Gynecol Oncol Rep. 2023 Oct; 49: [id number]    Scouting biopsies on 06/03/23: negative Scouting biopsies 10/14/254: lichenoid dermatitis Biopsies 12/23/23: melanoma in situ (left), atypical junctional melanocytic proliferation (right) Vulvectomy 02/11/24: negative Biopsies 06/08/24: mild hyperpigmentation of epithelium, no atypical melanocytic proliferation. Biopsies 09/07/24: Slight hyperpigmentation consistent with benign melanocytic macule  Interval History: Presents today with her husband.  Had Mohs surgery on her nose for basal cell carcinoma about 2 weeks ago.  Has follow-up in 12/30/2024.  Reports that she has not looked at her vulva in a bit but does not notice any bleeding or new lumps.  Just had her mammogram which was negative which she is excited about.  Also reports that she had the Watchman procedure completed for atrial fibrillation which has improved that greatly.  She  reports that there is a chance she may total come off of her Eliquis pending some additional follow-up from the procedure.   Past Medical/Surgical History: Past Medical History:  Diagnosis Date   Actinic keratosis 03/29/2021   R central upper lip   Allergic rhinitis    Allergy    Anemia 10/29/2023   Anxiety    Arthritis    Atypical mole 12/26/2021   spinal upper back, exc 01/15/22   Basal cell carcinoma 10/16/2010   R lower pretibia    Basal cell carcinoma 10/06/2024   left nasal tip, referral sent to Dr. Corey for mohs   Bipolar 1 disorder (HCC)    Chronic kidney disease    Chronic kidney disease    Depression    Dyspnea    Fissure, anal    GERD (gastroesophageal reflux disease)    History of basal cell carcinoma excision    ON FACE   History of squamous cell carcinoma excision    ON RIGHT LEG   Hyperlipidemia    Hypothyroidism    IBS (irritable bowel syndrome)    Liver cyst    Malignant melanoma in situ (HCC) 11/20/2022   Right labia minora. Excision 12/25/2022 at anterior vulva, left of clitoral hood, margins involved. Surgery scheduled for 02/12/2023. Recurrence 12/23/2023, excised 02/11/2024, margins clear.   Mallet finger of left hand    small finger   NSTEMI (non-ST elevated myocardial infarction) (HCC) 10/22/2023   Pneumonia    Pre-diabetes     Past Surgical History:  Procedure Laterality Date   ABLATION     CARDIAC CATHETERIZATION  04/10/1999   per pt normal  CARDIOVASCULAR STRESS TEST  02-23-2011  dr vinie fath [facility])   normal nuclear study/  no ischemia/  ef 64%   CARDIOVERSION N/A 01/10/2024   Procedure: CARDIOVERSION;  Surgeon: Hilarie Rocher, MD;  Location: [facility];  Service: Cardiovascular;  Laterality: N/A;   CATARACT EXTRACTION W/ INTRAOCULAR LENS  IMPLANT, BILATERAL  12/10/2001   Bil   CESAREAN SECTION  12/10/1982   CLOSED REDUCTION FINGER WITH PERCUTANEOUS PINNING Left 04/22/2014   Procedure: LEFT SMALL FINGER CLOSED REDUCTION  PINNING;  Surgeon: Prentice LELON Pagan, MD;  Location: [facility];  Service: Orthopedics;  Laterality: Left;   COLONOSCOPY  11/09/2002   DILATION AND CURETTAGE OF UTERUS  1977   LAPAROSCOPIC CHOLECYSTECTOMY  03/18/2000   RECONSTRUCTION OF EYELID Bilateral 08/2022   RIGHT/LEFT HEART CATH AND CORONARY ANGIOGRAPHY Bilateral 11/25/2023   Procedure: RIGHT/LEFT HEART CATH AND CORONARY ANGIOGRAPHY;  Surgeon: Florencio Cara BIRCH, MD;  Location: [facility];  Service: Cardiovascular;  Laterality: Bilateral;   TEE WITHOUT CARDIOVERSION N/A 01/10/2024   Procedure: TRANSESOPHAGEAL ECHOCARDIOGRAM (TEE);  Surgeon: Hilarie Rocher, MD;  Location: [facility];  Service: Cardiovascular;  Laterality: N/A;   TRANSTHORACIC ECHOCARDIOGRAM  02/23/2011   normal lvf/  ef 58%/  mild rve/   mild lae/  mild  mr  & tr/  mild pulmonary htn   VULVECTOMY N/A 12/25/2022   Procedure: PARTIAL  VULVECTOMY;  Surgeon: Eldonna Mays, MD;  Location: [facility] [place];  Service: Gynecology;  Laterality: N/A;   VULVECTOMY N/A 02/12/2023   Procedure: WIDE LOCAL EXCISION OF VULVA AND REMOVAL OF CLITORIS;  Surgeon: Eldonna Mays, MD;  Location: [facility] [place];  Service: Gynecology;  Laterality: N/A;   VULVECTOMY N/A 02/11/2024   Procedure: WIDE EXCISION VULVECTOMY;  Surgeon: Eldonna Mays, MD;  Location: [facility];  Service: Gynecology;  Laterality: N/A;   WISDOM TOOTH EXTRACTION      Family History  Problem Relation Age of Onset   Stroke Mother    Heart disease Mother    Hypertension Father    Diabetes Father    Hyperlipidemia Father    Breast cancer Sister    Cancer Sister    Alcohol abuse Sister    Arthritis Sister    Mental illness Sister    Diabetes Brother    Colon cancer Neg Hx    Ovarian cancer Neg Hx    Endometrial cancer Neg Hx    Pancreatic cancer Neg Hx    Prostate cancer Neg Hx     Social History   Socioeconomic History   Marital status: Married    Spouse name:  Not on file   Number of children: 1   Years of education: Not on file   Highest education level: Bachelor's degree (e.g., BA, AB, BS)  Occupational History   Occupation: Home    Employer: RETIRED  Tobacco Use   Smoking status: Never   Smokeless tobacco: Never  Vaping Use   Vaping status: Never Used  Substance and Sexual Activity   Alcohol use: Not Currently    Comment: Only on cruises or rarely   Drug use: No   Sexual activity: Not Currently    Birth control/protection: Post-menopausal  Other Topics Concern   Not on file  Social History Narrative   Moved here from [place]   Social Drivers of Health   Tobacco Use: Low Risk (12/02/2024)   Patient History    Smoking Tobacco Use: Never    Smokeless Tobacco Use: Never  Passive Exposure: Not on file  Financial Resource Strain: Low Risk (09/27/2024)   Overall Financial Resource Strain (CARDIA)    Difficulty of Paying Living Expenses: Not very hard  Food Insecurity: No Food Insecurity (10/01/2024)   Epic    Worried About Programme Researcher, Broadcasting/film/video in the Last Year: Never true    Ran Out of Food in the Last Year: Never true  Transportation Needs: No Transportation Needs (10/01/2024)   Epic    Lack of Transportation (Medical): No    Lack of Transportation (Non-Medical): No  Physical Activity: Insufficiently Active (09/27/2024)   Exercise Vital Sign    Days of Exercise per Week: 3 days    Minutes of Exercise per Session: 40 min  Stress: Stress Concern Present (09/27/2024)   [employer] of Occupational Health - Occupational Stress Questionnaire    Feeling of Stress: To some extent  Social Connections: Socially Integrated (09/27/2024)   Social Connection and Isolation Panel    Frequency of Communication with Friends and Family: More than three times a week    Frequency of Social Gatherings with Friends and Family: Patient declined    Attends Religious Services: More than 4 times per year    Active Member of Clubs or Organizations:  Yes    Attends Banker Meetings: More than 4 times per year    Marital Status: Married  Depression (PHQ2-9): Medium Risk (10/23/2024)   Depression (PHQ2-9)    PHQ-2 Score: 8  Alcohol Screen: Low Risk (11/08/2023)   Alcohol Screen    Last Alcohol Screening Score (AUDIT): 1  Housing: Unknown (10/01/2024)   Epic    Unable to Pay for Housing in the Last Year: No    Number of Times Moved in the Last Year: Not on file    Homeless in the Last Year: No  Utilities: Not At Risk (10/01/2024)   Epic    Threatened with loss of utilities: No  Health Literacy: Adequate Health Literacy (05/27/2024)   B1300 Health Literacy    Frequency of need for help with medical instructions: Never    Current Medications:  Current Outpatient Medications:    acetaminophen  (TYLENOL ) 650 MG CR tablet, Take 1,300 mg by mouth every 8 (eight) hours as needed for pain., Disp: , Rfl:    apixaban (ELIQUIS) 5 MG TABS tablet, Take 5 mg by mouth 2 (two) times daily., Disp: , Rfl:    ARIPiprazole  (ABILIFY ) 10 MG tablet, Take 10 mg by mouth at bedtime., Disp: , Rfl:    Biotin 5000 MCG CAPS, Take 5,000 mcg by mouth daily., Disp: , Rfl:    buPROPion  (WELLBUTRIN  XL) 300 MG 24 hr tablet, Take 300 mg by mouth daily., Disp: , Rfl:    cetirizine (ZYRTEC ALLERGY) 10 MG tablet, Take 10 mg by mouth daily., Disp: , Rfl:    cholecalciferol (VITAMIN D3) 25 MCG (1000 UNIT) tablet, Take 1,000 Units by mouth daily., Disp: , Rfl:    clonazePAM  (KLONOPIN ) 1 MG tablet, Take 1 mg by mouth at bedtime., Disp: , Rfl:    Coenzyme Q10 (COQ10) 100 MG CAPS, Take 100 mg by mouth at bedtime., Disp: , Rfl:    famotidine  (PEPCID ) 20 MG tablet, TAKE 1 TABLET TWICE DAILY, Disp: 180 tablet, Rfl: 1   fluticasone  (FLONASE ) 50 MCG/ACT nasal spray, Place 2 sprays into both nostrils daily., Disp: 48 g, Rfl: 3   JARDIANCE 10 MG TABS tablet, Take 10 mg by mouth daily., Disp: , Rfl:    lamoTRIgine  (LAMICTAL ) 200  MG tablet, Take 200 mg by mouth at  bedtime., Disp: , Rfl:    levothyroxine  (SYNTHROID ) 50 MCG tablet, TAKE 1 TABLET EVERY DAY BEFORE BREAKFAST, Disp: 90 tablet, Rfl: 2   lisinopril  (ZESTRIL ) 2.5 MG tablet, Take 1 tablet (2.5 mg total) by mouth daily., Disp: 30 tablet, Rfl: 0   lovastatin  (MEVACOR ) 40 MG tablet, TAKE 1 TABLET EVERY DAY, Disp: 90 tablet, Rfl: 2   mupirocin  ointment (BACTROBAN ) 2 %, Apply 1 Application topically 2 (two) times daily., Disp: 22 g, Rfl: 1   nystatin  powder, Apply 1 Application topically 3 (three) times daily as needed., Disp: , Rfl:    traZODone (DESYREL) 50 MG tablet, Take 50-150 mg by mouth at bedtime as needed. (Patient not taking: Reported on 10/23/2024), Disp: , Rfl:   Review of Symptoms: Complete 10-system review is positive for: Joint pain, easy bruising/bleeding, problems walking  Physical Exam: BP 109/76 (BP Location: Left Arm, Patient Position: Sitting)   Pulse 64   Temp 98.7 F (37.1 C) (Oral)   Resp 19   Wt 166 lb 6.4 oz (75.5 kg)   SpO2 97%   BMI 32.50 kg/m  General: Alert, oriented, no acute distress. HEENT: Bandage on nose from recent Mohs procedure Chest: Normal work of breathing.  Extremities: Grossly normal range of motion.  Warm, well perfused.  Edema bilateral ankles Skin: No rashes or lesions noted. GU: External genitalia with well healed anterior vulvar resection.  Benign vascular changes with purple raised punctate lesions, stable from prior.  No areas of darkening of concern.  No biopsies obtained today. Exam chaperoned by Kimberly Jordan, CMA     Laboratory & Radiologic Studies: Surgical pathology (09/07/24): A. ANTERIOR VULVA BIOPSY:  Slight hyperpigmentation consistent with benign melanocytic macule.  Negative for in situ and invasive melanoma.   B. LEFT LABIA, ANTERIOR BIOPSY:  Slight hyperpigmentation consistent with benign melanocytic macule.  Negative for in situ and invasive melanoma.

## 2024-12-08 ENCOUNTER — Ambulatory Visit: Admitting: Clinical

## 2024-12-11 ENCOUNTER — Encounter: Payer: Self-pay | Admitting: Family Medicine

## 2024-12-11 ENCOUNTER — Other Ambulatory Visit: Payer: Self-pay | Admitting: Family Medicine

## 2024-12-11 ENCOUNTER — Ambulatory Visit (INDEPENDENT_AMBULATORY_CARE_PROVIDER_SITE_OTHER): Admitting: Family Medicine

## 2024-12-11 ENCOUNTER — Ambulatory Visit: Payer: Self-pay | Admitting: Family Medicine

## 2024-12-11 ENCOUNTER — Ambulatory Visit
Admission: RE | Admit: 2024-12-11 | Discharge: 2024-12-11 | Disposition: A | Discharge status: Home / Self Care | Source: Ambulatory Visit | Attending: Family Medicine | Admitting: Family Medicine

## 2024-12-11 VITALS — BP 116/62 | HR 62 | Temp 97.6°F | Ht 60.0 in | Wt 165.0 lb

## 2024-12-11 DIAGNOSIS — R600 Localized edema: Secondary | ICD-10-CM

## 2024-12-11 DIAGNOSIS — N644 Mastodynia: Secondary | ICD-10-CM

## 2024-12-11 DIAGNOSIS — I872 Venous insufficiency (chronic) (peripheral): Secondary | ICD-10-CM | POA: Insufficient documentation

## 2024-12-11 DIAGNOSIS — R0789 Other chest pain: Secondary | ICD-10-CM | POA: Diagnosis not present

## 2024-12-11 DIAGNOSIS — R001 Bradycardia, unspecified: Secondary | ICD-10-CM

## 2024-12-11 NOTE — Patient Instructions (Addendum)
 Call Dr Bernita office when you get home -let them know your pulse rate is lowe (we got 4) and you feel a little woozy   Change position slowly- be careful    Chest xray now -since you were tender in the ribs  I may consider an ultrasound   We will reach out with results   For the ankle- start wearing your compression socks during the day Elevate feet when you sit if you can  If this worsens please let us  know

## 2024-12-11 NOTE — Assessment & Plan Note (Signed)
 Cxr-no abnormalities  No expl of left axillary pain/tenderness of chest wall   Recommend warm compress  Consider US  breast

## 2024-12-11 NOTE — Assessment & Plan Note (Signed)
 Puffy ankles but no pitting today Weight is stable

## 2024-12-11 NOTE — Assessment & Plan Note (Signed)
 Pulse on arrival here was 40  Pt admits to occational feeling woozy  By end of visit- back up into 60s   Not on rate lowering medication  Has watchman for a fib  Still taking eliquis   Will reach out to her cardiologist today  Encouraged to change position slowly  Watching pulse on her apple watch also   Call back and Er precautions noted in detail today

## 2024-12-11 NOTE — Assessment & Plan Note (Signed)
 Area on right medial ankle with more broken spider veins - mildly tender , with mild redness but no scale or other skin change  Has been on feet more lately  Encouraged to start wearing her support socks regularly during the day  Then update if worse or not improved

## 2024-12-11 NOTE — Assessment & Plan Note (Addendum)
 For 2-3 days  Intermittent sharp pain lasting less than a minute-primarily in her axilla  No triggers/no recent trauma or illness and no pleuritic pain   On exam some tenderness of ribs laterally  No breast lumps noted / some lateral tenderness of left breast , no skin changes  Cxr ordered -normal

## 2024-12-11 NOTE — Progress Notes (Signed)
 "  Subjective:    Patient ID: [name] [name], female    DOB: [birth year], 69 y.o.   MRN: [id number]  HPI  Wt Readings from Last 3 Encounters:  12/11/24 165 lb (74.8 kg)  12/07/24 166 lb 6.4 oz (75.5 kg)  10/23/24 164 lb (74.4 kg)   32.22 kg/m  Vitals:   12/11/24 1044 12/11/24 1120  BP: 116/62   Pulse: (!) 40 62  Temp: 97.6 F (36.4 C)   SpO2: 99%      Pt presents with c/o  Skin spot on ankle Breast discomfort   Noted low pulse today  History of a fib  did have ablation and watchman procedure  Cardiology- Dr Huey    Spot appeared 2 d ago  A little swollen No itching or burning  No trauma or bug bite Has not been wearing support garments   Breast pain- 2-3 days  Really sharp pains under left arm  About every 10 min -worse at night and in the am  Sore to the touch  Had mammogram 11/30/24  (not especially painful this time)  Normal  Had scattered areas of fibroglandular density  No lump that she can feel  No rash or skin change on breast   Sister had mastectomy for breast cancer   No trauma  No new exercise or activity   Knee arthritis - had cortisone shot   No chest pain  No shortness of breath   Has been a little woozy- ? Due to heart rate   Has TEE/echo planned upcoming with sub specialist /EP ? If possible amyloidosis of heart - had no elect pulse in left heart  Mri came back ok    CXR DG Chest 2 View Result Date: 12/11/2024 EXAM: 2 VIEW(S) XRAY OF THE CHEST 12/11/2024 11:29:55 AM COMPARISON: 09/03/2024 CLINICAL HISTORY: left breast pain (some lateral rib tenderness on exam) FINDINGS: LUNGS AND PLEURA: No focal pulmonary opacity. No pleural effusion. No pneumothorax. HEART AND MEDIASTINUM: Interval left atrial occlusion device placement. BONES AND SOFT TISSUES: Right upper quadrant surgical clips. No acute osseous abnormality. IMPRESSION: 1. No acute cardiopulmonary abnormality. Electronically signed by: Michaeline Blanch MD 12/11/2024 12:22 PM EST RP  Workstation: [id number]   MM 3D SCREENING MAMMOGRAM BILATERAL BREAST Result Date: 12/07/2024 CLINICAL DATA:  Screening. EXAM: DIGITAL SCREENING BILATERAL MAMMOGRAM WITH TOMOSYNTHESIS AND CAD TECHNIQUE: Bilateral screening digital craniocaudal and mediolateral oblique mammograms were obtained. Bilateral screening digital breast tomosynthesis was performed. The images were evaluated with computer-aided detection. COMPARISON:  Previous exam(s). ACR Breast Density Category b: There are scattered areas of fibroglandular density. FINDINGS: There are no findings suspicious for malignancy. IMPRESSION: No mammographic evidence of malignancy. A result letter of this screening mammogram will be mailed directly to the patient. RECOMMENDATION: Screening mammogram in one year. (Code:SM-B-01Y) BI-RADS CATEGORY  1: Negative. Electronically Signed   By: Alm Parkins M.D.   On: 12/07/2024 10:25      Patient Active Problem List   Diagnosis Date Noted   Venous insufficiency of both lower extremities 12/11/2024   Breast pain, left 12/11/2024   Left-sided chest wall pain 12/11/2024   Abrasion, forearm w/o infection 09/28/2024   Presence of Watchman left atrial appendage closure device 09/28/2024   Constipation 09/28/2024   Purpura senilis 09/03/2024   Knee pain, right 06/01/2024   Rosacea 04/02/2024   Pulmonary hypertension, unspecified (HCC) 12/31/2023   Edema of lower extremity 12/31/2023   Chronic kidney disease, stage 3a (HCC) 12/31/2023   NSTEMI (non-ST elevated myocardial  infarction) (HCC) 10/22/2023   S/P ablation of atrial fibrillation 09/30/2023   Chronic anticoagulation 09/30/2023   Pain of left hip 09/11/2023   History of anal fissures 09/09/2023   Osteopenia 07/30/2023   Headache 07/05/2023   Paroxysmal atrial fibrillation (HCC) 06/27/2023   Bradycardia 05/30/2023   Melanoma in situ (HCC) 12/25/2022   ASCUS of cervix with negative high risk HPV 06/25/2022   Osteoarthritis of knee 05/25/2022    Hemorrhoid 04/25/2022   Estrogen deficiency 01/02/2022   Bipolar disorder, in partial remission, most recent episode mixed (HCC) 02/25/2020   Dyspnea 01/19/2020   Prediabetes 03/03/2018   Atrophic vaginitis 07/08/2017   Cervical stenosis (uterine cervix) 01/03/2017   Hearing loss 10/25/2015   Routine general medical examination at a health care facility 10/17/2015   Facial rash 02/22/2015   Colon cancer screening 10/16/2013   Enthesopathy of ankle and tarsus 12/01/2009   Enthesopathy of ankle and tarsus 12/01/2009   Asymptomatic postmenopausal status 09/30/2009   Arthropathy of pelvic region and thigh 03/10/2009   DEGENERATIVE DISC DISEASE, LUMBAR SPINE 03/10/2009   DDD (degenerative disc disease), lumbosacral 03/10/2009   ONYCHOMYCOSIS 09/17/2008   Hypothyroidism 08/05/2007   Class 2 severe obesity due to excess calories with serious comorbidity and body mass index (BMI) of 35.0 to 35.9 in adult 08/05/2007   Bipolar disorder (HCC) 08/05/2007   Allergic rhinitis 08/05/2007   Irritable bowel syndrome 08/05/2007   Esophageal reflux 08/05/2007   HYPERCHOLESTEROLEMIA, PURE 07/07/2007   Past Medical History:  Diagnosis Date   Actinic keratosis 03/29/2021   R central upper lip   Allergic rhinitis    Allergy    Anemia 10/29/2023   Anxiety    Arthritis    Atypical mole 12/26/2021   spinal upper back, exc 01/15/22   Basal cell carcinoma 10/16/2010   R lower pretibia    Basal cell carcinoma 10/06/2024   left nasal tip, referral sent to Dr. Corey for mohs   Bipolar 1 disorder (HCC)    Chronic kidney disease    Chronic kidney disease    Depression    Dyspnea    Fissure, anal    GERD (gastroesophageal reflux disease)    History of basal cell carcinoma excision    ON FACE   History of squamous cell carcinoma excision    ON RIGHT LEG   Hyperlipidemia    Hypothyroidism    IBS (irritable bowel syndrome)    Liver cyst    Malignant melanoma in situ (HCC) 11/20/2022   Right  labia minora. Excision 12/25/2022 at anterior vulva, left of clitoral hood, margins involved. Surgery scheduled for 02/12/2023. Recurrence 12/23/2023, excised 02/11/2024, margins clear.   Mallet finger of left hand    small finger   NSTEMI (non-ST elevated myocardial infarction) (HCC) 10/22/2023   Pneumonia    Pre-diabetes    Past Surgical History:  Procedure Laterality Date   ABLATION     CARDIAC CATHETERIZATION  04/10/1999   per pt normal   CARDIOVASCULAR STRESS TEST  02-23-2011  dr vinie fath [facility])   normal nuclear study/  no ischemia/  ef 64%   CARDIOVERSION N/A 01/10/2024   Procedure: CARDIOVERSION;  Surgeon: Hilarie Rocher, MD;  Location: [facility];  Service: Cardiovascular;  Laterality: N/A;   CATARACT EXTRACTION W/ INTRAOCULAR LENS  IMPLANT, BILATERAL  12/10/2001   Bil   CESAREAN SECTION  12/10/1982   CLOSED REDUCTION FINGER WITH PERCUTANEOUS PINNING Left 04/22/2014   Procedure: LEFT SMALL FINGER CLOSED REDUCTION PINNING;  Surgeon: Prentice ORN  Shari, MD;  Location: [facility];  Service: Orthopedics;  Laterality: Left;   COLONOSCOPY  11/09/2002   DILATION AND CURETTAGE OF UTERUS  1977   LAPAROSCOPIC CHOLECYSTECTOMY  03/18/2000   RECONSTRUCTION OF EYELID Bilateral 08/2022   RIGHT/LEFT HEART CATH AND CORONARY ANGIOGRAPHY Bilateral 11/25/2023   Procedure: RIGHT/LEFT HEART CATH AND CORONARY ANGIOGRAPHY;  Surgeon: Florencio Cara BIRCH, MD;  Location: [facility];  Service: Cardiovascular;  Laterality: Bilateral;   TEE WITHOUT CARDIOVERSION N/A 01/10/2024   Procedure: TRANSESOPHAGEAL ECHOCARDIOGRAM (TEE);  Surgeon: Hilarie Rocher, MD;  Location: [facility];  Service: Cardiovascular;  Laterality: N/A;   TRANSTHORACIC ECHOCARDIOGRAM  02/23/2011   normal lvf/  ef 58%/  mild rve/   mild lae/  mild  mr  & tr/  mild pulmonary htn   VULVECTOMY N/A 12/25/2022   Procedure: PARTIAL  VULVECTOMY;  Surgeon: Eldonna Mays, MD;  Location: [facility];   Service: Gynecology;  Laterality: N/A;   VULVECTOMY N/A 02/12/2023   Procedure: WIDE LOCAL EXCISION OF VULVA AND REMOVAL OF CLITORIS;  Surgeon: Eldonna Mays, MD;  Location: [facility] [place];  Service: Gynecology;  Laterality: N/A;   VULVECTOMY N/A 02/11/2024   Procedure: WIDE EXCISION VULVECTOMY;  Surgeon: Eldonna Mays, MD;  Location: [facility];  Service: Gynecology;  Laterality: N/A;   WISDOM TOOTH EXTRACTION     Social History[1] Family History  Problem Relation Age of Onset   Stroke Mother    Heart disease Mother    Hypertension Father    Diabetes Father    Hyperlipidemia Father    Breast cancer Sister    Cancer Sister    Alcohol abuse Sister    Arthritis Sister    Mental illness Sister    Diabetes Brother    Colon cancer Neg Hx    Ovarian cancer Neg Hx    Endometrial cancer Neg Hx    Pancreatic cancer Neg Hx    Prostate cancer Neg Hx    Allergies[2] Medications Ordered Prior to Encounter[3]  Review of Systems  Constitutional:  Negative for activity change, appetite change, fatigue, fever and unexpected weight change.  HENT:  Negative for congestion, ear pain, rhinorrhea, sinus pressure and sore throat.   Eyes:  Negative for pain, redness and visual disturbance.  Respiratory:  Negative for cough, chest tightness, shortness of breath and wheezing.   Cardiovascular:  Negative for chest pain and palpitations.       Ankles are always puffy  Varicose veins   Gastrointestinal:  Negative for abdominal pain, blood in stool, constipation and diarrhea.  Endocrine: Negative for polydipsia and polyuria.  Genitourinary:  Negative for dysuria, frequency and urgency.       Left breast pain   Musculoskeletal:  Positive for arthralgias. Negative for back pain and myalgias.  Skin:  Negative for pallor and rash.       Redness left ankle   Allergic/Immunologic: Negative for environmental allergies.  Neurological:  Positive for light-headedness. Negative for dizziness,  syncope and headaches.  Hematological:  Negative for adenopathy. Does not bruise/bleed easily.  Psychiatric/Behavioral:  Negative for decreased concentration and dysphoric mood. The patient is not nervous/anxious.        Objective:   Physical Exam Constitutional:      General: She is not in acute distress.    Appearance: Normal appearance. She is well-developed. She is obese. She is not ill-appearing or diaphoretic.  HENT:     Head: Normocephalic and atraumatic.     Mouth/Throat:  Mouth: Mucous membranes are moist.  Eyes:     Conjunctiva/sclera: Conjunctivae normal.     Pupils: Pupils are equal, round, and reactive to light.  Neck:     Thyroid : No thyromegaly.     Vascular: No carotid bruit or JVD.  Cardiovascular:     Rate and Rhythm: Regular rhythm. Bradycardia present.     Heart sounds: Normal heart sounds.     No gallop.     Comments: Rate of 40 at arrival  60s by end of visit   Pulmonary:     Effort: Pulmonary effort is normal. No respiratory distress.     Breath sounds: Normal breath sounds. No stridor. No wheezing, rhonchi or rales.     Comments: Left lateral middle rib tenderness No step off or crepitus No skin changes  Chest:     Chest wall: Tenderness present.  Abdominal:     General: There is no distension or abdominal bruit.     Palpations: Abdomen is soft. There is no mass.     Tenderness: There is no abdominal tenderness. There is no guarding or rebound.  Genitourinary:    Comments: Breast exam: No mass, nodules, thickening, bulging, retraction, inflamation, nipple discharge or skin changes noted.  No axillary or clavicular LA.    Mild tenderness of left breast into axilla   (also underlying ribs)    Musculoskeletal:     Cervical back: Normal range of motion and neck supple.     Right lower leg: No edema.     Left lower leg: No edema.     Comments: Moderate varicosities in LEs bilaterally (spider type)  2 by 2.5 cm area of clustered spider veins  in RLE -mildly tender with slight erythema   No palp cords Neg homan's sign   Normal pedal pulses   Lymphadenopathy:     Cervical: No cervical adenopathy.  Skin:    General: Skin is warm and dry.     Coloration: Skin is not jaundiced or pale.     Findings: Erythema present. No bruising or rash.     Comments: Bandage on nose from recent derm surgery  Neurological:     Mental Status: She is alert.     Coordination: Coordination normal.     Deep Tendon Reflexes: Reflexes are normal and symmetric. Reflexes normal.  Psychiatric:        Mood and Affect: Mood normal.           Assessment & Plan:   Problem List Items Addressed This Visit       Cardiovascular and Mediastinum   Venous insufficiency of both lower extremities - Primary   Area on right medial ankle with more broken spider veins - mildly tender , with mild redness but no scale or other skin change  Has been on feet more lately  Encouraged to start wearing her support socks regularly during the day  Then update if worse or not improved         Other   Left-sided chest wall pain   Cxr-no abnormalities  No expl of left axillary pain/tenderness of chest wall   Recommend warm compress  Consider US  breast       Relevant Orders   DG Chest 2 View (Completed)   Edema of lower extremity   Puffy ankles but no pitting today Weight is stable       Breast pain, left   For 2-3 days  Intermittent sharp pain lasting less than a minute-primarily in  her axilla  No triggers/no recent trauma or illness and no pleuritic pain   On exam some tenderness of ribs laterally  No breast lumps noted / some lateral tenderness of left breast , no skin changes  Cxr ordered -normal         Relevant Orders   US  BREAST COMPLETE UNI LEFT INC AXILLA   Bradycardia   Pulse on arrival here was 40  Pt admits to occational feeling woozy  By end of visit- back up into 60s   Not on rate lowering medication  Has watchman for a fib   Still taking eliquis   Will reach out to her cardiologist today  Encouraged to change position slowly  Watching pulse on her apple watch also   Call back and Er precautions noted in detail today           [1]  Social History Tobacco Use   Smoking status: Never   Smokeless tobacco: Never  Vaping Use   Vaping status: Never Used  Substance Use Topics   Alcohol use: Not Currently    Comment: Only on cruises or rarely   Drug use: No  [2]  Allergies Allergen Reactions   Ingrezza [Valbenazine Tosylate] Other (See Comments)    Night sweats, worsened tardive dyskinesia, nightmares   Lidocaine  Swelling    ALL CAINE DRUGS EXCEPT: marcaine  & sensercaine    Xylocaine  [Lidocaine  Hcl] Swelling    ALLERGIC TO ALL CAINES EXCEPT SENSORCAINE  , AND MARCAINE    Albuterol Other (See Comments)    Leg swelling   Amantadine     Muscle pain and tremors    Austedo Xr [Deutetrabenazine]     Caused Muscle aches and tremors   Codeine Nausea And Vomiting    Vomits blood   Lithium Swelling   Singulair [Montelukast] Swelling    Leg swelling   Tegretol [Carbamazepine] Other (See Comments)    skin crawls  [3]  Current Outpatient Medications on File Prior to Visit  Medication Sig Dispense Refill   acetaminophen  (TYLENOL ) 650 MG CR tablet Take 1,300 mg by mouth every 8 (eight) hours as needed for pain.     apixaban (ELIQUIS) 5 MG TABS tablet Take 5 mg by mouth 2 (two) times daily.     ARIPiprazole  (ABILIFY ) 10 MG tablet Take 10 mg by mouth at bedtime.     Biotin 5000 MCG CAPS Take 5,000 mcg by mouth daily.     buPROPion  (WELLBUTRIN  XL) 300 MG 24 hr tablet Take 300 mg by mouth daily.     cetirizine (ZYRTEC ALLERGY) 10 MG tablet Take 10 mg by mouth daily.     cholecalciferol (VITAMIN D3) 25 MCG (1000 UNIT) tablet Take 1,000 Units by mouth daily.     clonazePAM  (KLONOPIN ) 1 MG tablet Take 1 mg by mouth at bedtime.     Coenzyme Q10 (COQ10) 100 MG CAPS Take 100 mg by mouth at bedtime.      famotidine  (PEPCID ) 20 MG tablet TAKE 1 TABLET TWICE DAILY 180 tablet 1   fluticasone  (FLONASE ) 50 MCG/ACT nasal spray Place 2 sprays into both nostrils daily. 48 g 3   JARDIANCE 10 MG TABS tablet Take 10 mg by mouth daily.     lamoTRIgine  (LAMICTAL ) 200 MG tablet Take 200 mg by mouth at bedtime.     levothyroxine  (SYNTHROID ) 50 MCG tablet TAKE 1 TABLET EVERY DAY BEFORE BREAKFAST 90 tablet 2   lisinopril  (ZESTRIL ) 2.5 MG tablet Take 1 tablet (2.5 mg total) by mouth daily.  30 tablet 0   lovastatin  (MEVACOR ) 40 MG tablet TAKE 1 TABLET EVERY DAY 90 tablet 2   mupirocin  ointment (BACTROBAN ) 2 % Apply 1 Application topically 2 (two) times daily. 22 g 1   nystatin  powder Apply 1 Application topically 3 (three) times daily as needed.     traZODone (DESYREL) 50 MG tablet Take 50-150 mg by mouth at bedtime as needed.     No current facility-administered medications on file prior to visit.   "

## 2024-12-16 ENCOUNTER — Ambulatory Visit
Admission: RE | Admit: 2024-12-16 | Discharge: 2024-12-16 | Disposition: A | Discharge status: Home / Self Care | Source: Ambulatory Visit | Attending: Specialist | Admitting: Specialist

## 2024-12-16 DIAGNOSIS — I272 Pulmonary hypertension, unspecified: Secondary | ICD-10-CM | POA: Diagnosis present

## 2024-12-16 DIAGNOSIS — R911 Solitary pulmonary nodule: Secondary | ICD-10-CM | POA: Diagnosis present

## 2024-12-16 DIAGNOSIS — J4520 Mild intermittent asthma, uncomplicated: Secondary | ICD-10-CM | POA: Insufficient documentation

## 2024-12-16 DIAGNOSIS — R0602 Shortness of breath: Secondary | ICD-10-CM | POA: Diagnosis present

## 2024-12-16 DIAGNOSIS — R0609 Other forms of dyspnea: Secondary | ICD-10-CM | POA: Diagnosis present

## 2024-12-22 ENCOUNTER — Ambulatory Visit
Admission: RE | Admit: 2024-12-22 | Discharge: 2024-12-22 | Disposition: A | Source: Ambulatory Visit | Attending: Family Medicine | Admitting: Family Medicine

## 2024-12-22 ENCOUNTER — Ambulatory Visit: Payer: Self-pay | Admitting: Family Medicine

## 2024-12-22 DIAGNOSIS — N644 Mastodynia: Secondary | ICD-10-CM

## 2024-12-30 ENCOUNTER — Ambulatory Visit: Admitting: Dermatology

## 2024-12-30 ENCOUNTER — Ambulatory Visit: Admitting: Psychology

## 2024-12-30 ENCOUNTER — Encounter: Payer: Self-pay | Admitting: Dermatology

## 2024-12-30 DIAGNOSIS — L719 Rosacea, unspecified: Secondary | ICD-10-CM | POA: Diagnosis not present

## 2024-12-30 DIAGNOSIS — F4320 Adjustment disorder, unspecified: Secondary | ICD-10-CM | POA: Diagnosis not present

## 2024-12-30 DIAGNOSIS — F3181 Bipolar II disorder: Secondary | ICD-10-CM | POA: Diagnosis not present

## 2024-12-30 DIAGNOSIS — F4322 Adjustment disorder with anxiety: Secondary | ICD-10-CM

## 2024-12-30 DIAGNOSIS — L905 Scar conditions and fibrosis of skin: Secondary | ICD-10-CM

## 2024-12-30 DIAGNOSIS — T1490XD Injury, unspecified, subsequent encounter: Secondary | ICD-10-CM

## 2024-12-30 DIAGNOSIS — Z85828 Personal history of other malignant neoplasm of skin: Secondary | ICD-10-CM | POA: Diagnosis not present

## 2024-12-30 MED ORDER — DOXYCYCLINE HYCLATE 100 MG PO CAPS: 100.0000 mg | ORAL_CAPSULE | Freq: Every day | ORAL | 0 refills | Status: AC

## 2024-12-30 NOTE — Progress Notes (Signed)
 " [facility] Counselor/Therapist Progress Note  Patient ID: [name] [name], MRN: [id number],    Date: 12/29/2024  Time Spent: 57 minutes  Time in: 2:04 Time out 3:01  Treatment Type: Individual Therapy  Reported Symptoms: none  Mental Status Exam: Appearance:  Casual     Behavior: Appropriate  Motor: Normal  Speech/Language:  normal  Affect: Appropriate  Mood: pleasant  Thought process: normal  Thought content:   WNL  Sensory/Perceptual disturbances:   WNL  Orientation: oriented to person, place, time/date, and situation  Attention: Good  Concentration: Good  Memory: WNL  Fund of knowledge:  Good  Insight:   Good  Judgment:  Good  Impulse Control: Good   Risk Assessment: Danger to Self:  No Self-injurious Behavior: No Danger to Others: No Duty to Warn:no Physical Aggression / Violence:No  Access to Firearms a concern: No  Gang Involvement:No   Subjective: The patient attended a face-to-face individual therapy session via video visit today.  The patient agreed and consented for this session to be on caregility and patient is aware of limitations of telehealth.  The patient was in her home alone and the therapist was in the office.  The patient presents with a blunted affect and mood is pleasant.  The patient reports that she has a lot of things going on right now.  She reports that she is going to have to have a pacemaker put in on February 18.  She also reports that they are doing testing to see if she has a syndrome that would cause her to possibly pass away in 1 to 5 years.  She seems to be handling this relatively well and we talked about the need to know what is going on so that she can be more prepared to be able to deal with it.  She states that her husband's already said that if for some reason she does have this then they could potentially move to [place]  so that she could be closer to her daughter.  In addition we talked about the best way to handle these  health issues and she seems to be managing her mood pretty well in relation to all these things.  I also offered for her to be seen more often if necessary while she is trying to figure these things out. Interventions: Cognitive Behavioral Therapy, meditation and mindfulness  Diagnosis:Bipolar II disorder (HCC)  Adjustment disorder with anxious mood  Anticipatory grief  Plan: Client Abilities/Strengths  intelligent, insightful  Client Treatment Preferences  Outpatient individual therapy  Client Statement of Needs  I need someone to help monitor and manage my bipolar  Treatment Level  Outpatient individual therapy  Symptoms  Depressed or irritable mood.: (Status: improved). Diminished interest in or  enjoyment of activities.: (Status: improved). Displays a poor attention span and is easily distracted.:  (Status: improved). Exhibits an abnormally and  persistently elevated, expansive, or irritable mood with at least three symptoms of mania (i.e., inflated  self-esteem or grandiosity, decreased need for sleep, pressured speech, flight of ideas, distractibility,  excessive goal-directed activity or psychomotor agitation, excessive involvement in pleasurable, highrisk behavior).:  (Status: improved). Feelings of hopelessness, worthlessness, or inappropriate guilt.: (Status: improved). History of at least one hypomanic,  manic, or mixed mood episode.:  (Status: maintained). History of chronic or  recurrent depression for which the client has taken antidepressant medication, been hospitalized, had  outpatient treatment, or had a course of electroconvulsive therapy.:  (Status:  maintained). Lack of energy.: (Status: improved).  Lacks follow-through in  projects, even though energy is very high, since behavior lacks discipline and goal-directedness.:  (Status: improved). Poor concentration and indecisiveness.:  (Status: improved). Reports flight of ideas or thoughts racing.: (Status: improved).  Shows evidence of a decreased need for sleep.: (Status: improved).  Sleeplessness or hypersomnia.:  (Status: improved). Social withdrawal.: (Status: improved). Suicidal thoughts and/or gestures.:   (Status: improved). The elevated mood or irritability (mania) causes marked impairment in  occupational functioning, social activities, or relationships with others.: (Status: improved). Verbalizes grandiose ideas and/or persecutory beliefs.:  (Status: improved).  Problems Addressed  Bipolar Disorder - Mania, Bipolar Disorder - Depression, Bipolar Disorder - Mania, Bipolar Disorder -  Mania, Bipolar Disorder - Depression, Bipolar Disorder - Depression, Bipolar Disorder - Mania  Goals 1. Achieve controlled behavior, moderated mood, more deliberative  speech and thought process, and a stable daily activity pattern. 2. Achieve controlled behavior, moderated mood, more deliberative  speech and thought process, and a stable daily activity pattern. 3. Alleviate manic/hypomanic mood and return to previous level of  effective functioning. 4. Develop healthy cognitive patterns and beliefs about self and the world  that lead to alleviation and help prevent the relapse of  manic/hypomanic episodes. Objective Maintain a pattern of regular rhythm to daily activities. Target Date: 2025-05-07 Frequency: Monthly Progress: 80 Modality: individual Related Interventions 1. Assist the client in establishing a more routine pattern of daily activities such as sleeping,  eating, solitary and social activities, and exercise; use and review a form to schedule, assess,  and modify these activities so that they occur in a predictable rhythm every day. Objective Identify and replace thoughts and behaviors that trigger manic or depressive symptoms. Target Date: 2025-05-07 Frequency: Monthly Progress: 80 Modality: individual Related Interventions 1. Use cognitive therapy techniques to explore and educate the client  about cognitive biases that  trigger his/her elevated or depressive mood (see Cognitive Therapy for Bipolar Disorder by  Lizette dunker al.). 2. Teach the client cognitive-behavioral coping and relapse prevention skills including delaying  impulsive actions, structured scheduling of daily activities, keeping a regular sleep routine,  avoiding unrealistic goal striving, using relaxation procedures, identifying and avoiding episode triggers such as stimulant drug use, alcohol consumption, breaking sleep routine, or exposing  self to high stress (see Cognitive Therapy for Bipolar Disorder by Lizette dunker al.). 3. Use cognitive therapy techniques to explore and educate the client's about cognitive biases that  trigger his/her elevated or depressive mood (see Cognitive Therapy for Bipolar Disorder by  Lizette dunker al.). Objective Discuss and resolve troubling personal and interpersonal issues. Target Date: 2025-05-07 Frequency: Monthly Progress: 60 Modality: individual Objective Participate in periodic maintenance sessions. Target Date: 2025-05-07 Frequency: Monthly Progress: 80 Modality: individual Related Interventions 1. Hold periodic maintenance sessions within the first few months after therapy to facilitate the  client's positive changes; problem-solve obstacles to improvement. 2. Hold periodic maintenance sessions within the first few months after therapy to facilitate the  client's positive changes; problem-solve obstacles to improvement. Objective Describe mood state, energy level, amount of control over thoughts, and sleeping pattern. Target Date: 2025-05-07 Frequency: Monthly Progress: 80 Modality: individual Related Interventions 1. Encourage the client to share his/her thoughts and feelings; express empathy and build rapport  while assessing primary cognitive, behavioral, interpersonal, or other symptoms of the mood  disorder. 2. Assess presence, severity, and impact of past and present  mood episodes on social,  occupational, and interpersonal functioning; supplement with semi-structured inventory, if  desired (e.g., Montgomery-Asberg Depression Rating Scale,  Inventory to Diagnose  Depression). 3. Encourage the client to share his/her thoughts and feelings; express empathy, and build rapport  while assessing primary cognitive, behavioral, interpersonal, or other symptoms of the mood  disorder. 4. Assess presence, severity, and impact of past and present mood episodes including mania (i.e.,  pressured speech, impulsive behavior, euphoric mood, flight of ideas, reduced need for sleep,  inflated self-esteem, and high energy) on social, occupational, and interpersonal functioning;  supplement with semi-structured inventory, if desired (e.g., Young Mania Rating Scale; the  Clinical Monitoring Form); re-administer as indicated to assess treatment response. 5. Develop healthy cognitive patterns and beliefs about self and the world  that lead to alleviation and help prevent the relapse of mood  episodes. 6. Normalize energy level and return to usual activities, good judgment,  stable mood, more realistic expectations, and goal-directed behavior. 7. Normalize energy level and return to usual activities, good judgment,  stable mood, more realistic expectations, and goal-directed behavior. Diagnosis 296.52 (Bipolar I disorder, most recent episode (or current) depressed, moderate)  Medications  1. Ambien  (Dosage: 10mg  qhs)  2. Klonopin  (Dosage: 1mg  TID)  3. Olanzapine (Dosage: 5 mg every other day and not on weekends)  4. Prozac (Dosage: 20 mg every other day and not on weekends)  5. Wellbutrin  (Dosage: 450 mg qd)  Conditions For Discharge Achievement of treatment goals and objectives  The patient approved this treatment plan and is making progress towards goals.  Adrielle Polakowski G Noura Purpura,  LCSW                                                                                                      "

## 2024-12-30 NOTE — Patient Instructions (Addendum)

## 2024-12-30 NOTE — Progress Notes (Signed)
" ° °  Follow Up Visit   Subjective  [name] [name] is a 69 y.o. female who presents for the following: follow up from Mohs surgery   The patient presents for follow up from Mohs surgery for a BCC on the left tip of nose, treated on 11/25/24, repaired with advancement flap. The patient has been bandaging the wound as directed. The endorse the following concerns: none   The following portions of the chart were reviewed this encounter and updated as appropriate: medications, allergies, medical history  Review of Systems:  No other skin or systemic complaints except as noted in HPI or Assessment and Plan.  Objective  Well appearing patient in no apparent distress; mood and affect are within normal limits.  A focal examination was performed including scalp, head, face. All findings within normal limits unless otherwise noted below.  Healing wound with mild erythema  Relevant physical exam findings are noted in the Assessment and Plan.    Assessment & Plan   Healing Wound s/p Mohs for [facility], treated on 11/25/24, repaired with advancement flap. - Reassured that wound is healing well - spitting sutures with foreign body granulomas - No evidence of infection - No swelling, induration, purulence, dehiscence, or tenderness out of proportion to the clinical exam, see photo above - Discussed that scars take up to 12 months to mature from the date of surgery - Recommend SPF 30+ to scar daily to prevent purple color from UV exposure during scar maturation process - Discussed that erythema and raised appearance of scar will fade over the next 4-6 months - OK to start scar massage at 4-6 weeks post-op - Can consider silicone based products for scar healing starting at 6 weeks post-op - Ok to continue ointment daily to wound under a bandage for another week  HISTORY OF BASAL CELL CARCINOMA OF THE SKIN - No evidence of recurrence today - Recommend regular full body skin exams - Recommend daily broad  spectrum sunscreen SPF 30+ to sun-exposed areas, reapply every 2 hours as needed.  - Call if any new or changing lesions are noted between office visits   ROSACEA Exam Mid face erythema with telangiectasias +/- scattered inflammatory papules  flared  Rosacea is a chronic progressive skin condition usually affecting the face of adults, causing redness and/or acne bumps. It is treatable but not curable. It sometimes affects the eyes (ocular rosacea) as well. It may respond to topical and/or systemic medication and can flare with stress, sun exposure, alcohol, exercise, topical steroids (including hydrocortisone /cortisone 10) and some foods.  Daily application of broad spectrum spf 30+ sunscreen to face is recommended to reduce flares.  Treatment Plan Prescribed doxycycline  100 mg daily for 2 weeks for suture reaction  Return in about 2 weeks (around 01/13/2025) for wound check.  I, Darice Smock, CMA, am acting as scribe for RUFUS CHRISTELLA HOLY, MD.   Documentation: I have reviewed the above documentation for accuracy and completeness, and I agree with the above.  RUFUS CHRISTELLA HOLY, MD  "

## 2025-01-13 ENCOUNTER — Encounter: Payer: Self-pay | Admitting: Dermatology

## 2025-01-13 ENCOUNTER — Ambulatory Visit: Admitting: Dermatology

## 2025-01-13 DIAGNOSIS — L910 Hypertrophic scar: Secondary | ICD-10-CM

## 2025-01-13 DIAGNOSIS — Z85828 Personal history of other malignant neoplasm of skin: Secondary | ICD-10-CM

## 2025-01-13 DIAGNOSIS — C44311 Basal cell carcinoma of skin of nose: Secondary | ICD-10-CM

## 2025-01-13 NOTE — Progress Notes (Signed)
" ° °  Follow Up Visit   Subjective  [name] [name] is a 69 y.o. female who presents for the following: follow up from Mohs surgery   The patient presents for follow up from Mohs surgery for a BCC on the left tip of nose, treated on 11/25/24, repaired with advancement flap. The patient has been bandaging the wound as directed. The endorse the following concerns: none  The following portions of the chart were reviewed this encounter and updated as appropriate: medications, allergies, medical history  Review of Systems:  No other skin or systemic complaints except as noted in HPI or Assessment and Plan.  Objective  Well appearing patient in no apparent distress; mood and affect are within normal limits.  A focal examination was performed including scalp, head, face. All findings within normal limits unless otherwise noted below.  Healing wound with mild erythema     Relevant physical exam findings are noted in the Assessment and Plan.    Assessment & Plan   Hypertrophic Scar s/p Mohs for [facility] on nose, treated on 11/25/24, repaired with advancement flap. - Reassured that wound is healing well - spitting sutures with foreign body granulomas - No evidence of infection - No swelling, induration, purulence, dehiscence, or tenderness out of proportion to the clinical exam, see photo above - Discussed that scars take up to 12 months to mature from the date of surgery - Recommend SPF 30+ to scar daily to prevent purple color from UV exposure during scar maturation process - Discussed that erythema and raised appearance of scar will fade over the next 4-6 months - OK to start scar massage at 4-6 weeks post-op - Can consider silicone based products for scar healing starting at 6 weeks post-op - Will do dermabrasion in 4 weeks  HISTORY OF BASAL CELL CARCINOMA OF THE SKIN - No evidence of recurrence today - Recommend regular full body skin exams - Recommend daily broad spectrum sunscreen SPF  30+ to sun-exposed areas, reapply every 2 hours as needed.  - Call if any new or changing lesions are noted between office visits  Return in about 4 weeks (around 02/10/2025) for dermabrasion.  I, Darice Smock, CMA, am acting as scribe for RUFUS CHRISTELLA HOLY, MD.   Documentation: I have reviewed the above documentation for accuracy and completeness, and I agree with the above.  RUFUS CHRISTELLA HOLY, MD  "

## 2025-01-13 NOTE — Patient Instructions (Signed)

## 2025-01-27 ENCOUNTER — Ambulatory Visit: Admitting: Psychology

## 2025-02-03 ENCOUNTER — Ambulatory Visit: Admitting: Psychology

## 2025-02-11 ENCOUNTER — Ambulatory Visit: Admitting: Dermatology

## 2025-02-23 ENCOUNTER — Ambulatory Visit: Admitting: Psychology

## 2025-03-01 ENCOUNTER — Inpatient Hospital Stay: Admitting: Psychiatry

## 2025-03-08 ENCOUNTER — Inpatient Hospital Stay: Admitting: Psychiatry

## 2025-03-23 ENCOUNTER — Ambulatory Visit: Admitting: Clinical

## 2025-04-13 ENCOUNTER — Encounter: Admitting: Dermatology

## 2025-05-28 ENCOUNTER — Ambulatory Visit
